# Patient Record
Sex: Female | Born: 1975 | Race: White | Hispanic: No | Marital: Married
Health system: Southern US, Community
[De-identification: ages and names within clinical notes are randomized; demographics above are authoritative.]

## PROBLEM LIST (undated history)

## (undated) DIAGNOSIS — E039 Hypothyroidism, unspecified: Secondary | ICD-10-CM

## (undated) DIAGNOSIS — Z1231 Encounter for screening mammogram for malignant neoplasm of breast: Secondary | ICD-10-CM

## (undated) DIAGNOSIS — N920 Excessive and frequent menstruation with regular cycle: Secondary | ICD-10-CM

## (undated) DIAGNOSIS — N840 Polyp of corpus uteri: Secondary | ICD-10-CM

## (undated) DIAGNOSIS — E282 Polycystic ovarian syndrome: Secondary | ICD-10-CM

## (undated) DIAGNOSIS — I10 Essential (primary) hypertension: Secondary | ICD-10-CM

## (undated) DIAGNOSIS — E119 Type 2 diabetes mellitus without complications: Secondary | ICD-10-CM

## (undated) DIAGNOSIS — E240 Pituitary-dependent Cushing's disease: Secondary | ICD-10-CM

## (undated) DIAGNOSIS — F32A Depression, unspecified: Secondary | ICD-10-CM

## (undated) DIAGNOSIS — F329 Major depressive disorder, single episode, unspecified: Secondary | ICD-10-CM

## (undated) DIAGNOSIS — G4730 Sleep apnea, unspecified: Secondary | ICD-10-CM

## (undated) DIAGNOSIS — E559 Vitamin D deficiency, unspecified: Secondary | ICD-10-CM

## (undated) DIAGNOSIS — T8859XA Other complications of anesthesia, initial encounter: Secondary | ICD-10-CM

## (undated) DIAGNOSIS — E1140 Type 2 diabetes mellitus with diabetic neuropathy, unspecified: Secondary | ICD-10-CM

## (undated) DIAGNOSIS — F419 Anxiety disorder, unspecified: Secondary | ICD-10-CM

## (undated) DIAGNOSIS — K3184 Gastroparesis: Secondary | ICD-10-CM

## (undated) DIAGNOSIS — E785 Hyperlipidemia, unspecified: Secondary | ICD-10-CM

## (undated) DIAGNOSIS — M5030 Other cervical disc degeneration, unspecified cervical region: Secondary | ICD-10-CM

## (undated) DIAGNOSIS — E1165 Type 2 diabetes mellitus with hyperglycemia: Secondary | ICD-10-CM

## (undated) DIAGNOSIS — Z794 Long term (current) use of insulin: Secondary | ICD-10-CM

## (undated) DIAGNOSIS — T50902A Poisoning by unspecified drugs, medicaments and biological substances, intentional self-harm, initial encounter: Secondary | ICD-10-CM

## (undated) DIAGNOSIS — T4145XA Adverse effect of unspecified anesthetic, initial encounter: Secondary | ICD-10-CM

## (undated) DIAGNOSIS — M979XXA Periprosthetic fracture around unspecified internal prosthetic joint, initial encounter: Secondary | ICD-10-CM

## (undated) DIAGNOSIS — M810 Age-related osteoporosis without current pathological fracture: Secondary | ICD-10-CM

## (undated) HISTORY — DX: Type 2 diabetes mellitus with hyperglycemia: E11.65

## (undated) HISTORY — DX: Long term (current) use of insulin: Z79.4

## (undated) HISTORY — DX: Poisoning by unspecified drugs, medicaments and biological substances, intentional self-harm, initial encounter: T50.902A

## (undated) HISTORY — DX: Gastroparesis: K31.84

## (undated) HISTORY — DX: Type 2 diabetes mellitus with diabetic neuropathy, unspecified: E11.40

## (undated) HISTORY — PX: TUBAL LIGATION: SHX77

## (undated) HISTORY — DX: Other cervical disc degeneration, unspecified cervical region: M50.30

---
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
## 1998-04-11 ENCOUNTER — Emergency Department (HOSPITAL_COMMUNITY): Admission: EM | Admit: 1998-04-11 | Discharge: 1998-04-11 | Payer: Self-pay | Admitting: Internal Medicine

## 2000-12-09 ENCOUNTER — Other Ambulatory Visit: Admission: RE | Admit: 2000-12-09 | Discharge: 2000-12-09 | Payer: Self-pay | Admitting: Obstetrics and Gynecology

## 2001-01-14 ENCOUNTER — Encounter: Admission: RE | Admit: 2001-01-14 | Discharge: 2001-04-14 | Payer: Self-pay | Admitting: Obstetrics and Gynecology

## 2001-02-06 ENCOUNTER — Ambulatory Visit (HOSPITAL_COMMUNITY): Admission: RE | Admit: 2001-02-06 | Discharge: 2001-02-06 | Payer: Self-pay | Admitting: Obstetrics and Gynecology

## 2001-02-06 ENCOUNTER — Encounter: Payer: Self-pay | Admitting: Obstetrics and Gynecology

## 2001-02-28 ENCOUNTER — Ambulatory Visit (HOSPITAL_COMMUNITY): Admission: RE | Admit: 2001-02-28 | Discharge: 2001-02-28 | Payer: Self-pay | Admitting: Obstetrics and Gynecology

## 2001-02-28 ENCOUNTER — Encounter: Payer: Self-pay | Admitting: Obstetrics and Gynecology

## 2001-03-16 ENCOUNTER — Inpatient Hospital Stay (HOSPITAL_COMMUNITY): Admission: AD | Admit: 2001-03-16 | Discharge: 2001-03-16 | Payer: Self-pay | Admitting: Obstetrics and Gynecology

## 2001-04-04 ENCOUNTER — Encounter: Payer: Self-pay | Admitting: Obstetrics and Gynecology

## 2001-04-04 ENCOUNTER — Ambulatory Visit (HOSPITAL_COMMUNITY): Admission: RE | Admit: 2001-04-04 | Discharge: 2001-04-04 | Payer: Self-pay | Admitting: Obstetrics and Gynecology

## 2001-05-05 ENCOUNTER — Encounter (HOSPITAL_COMMUNITY): Admission: RE | Admit: 2001-05-05 | Discharge: 2001-06-02 | Payer: Self-pay | Admitting: Obstetrics and Gynecology

## 2001-06-02 ENCOUNTER — Inpatient Hospital Stay (HOSPITAL_COMMUNITY): Admission: AD | Admit: 2001-06-02 | Discharge: 2001-06-02 | Payer: Self-pay | Admitting: *Deleted

## 2001-06-03 ENCOUNTER — Encounter: Payer: Self-pay | Admitting: Obstetrics and Gynecology

## 2001-06-03 ENCOUNTER — Inpatient Hospital Stay (HOSPITAL_COMMUNITY): Admission: AD | Admit: 2001-06-03 | Discharge: 2001-06-06 | Payer: Self-pay | Admitting: Obstetrics and Gynecology

## 2001-08-01 ENCOUNTER — Other Ambulatory Visit: Admission: RE | Admit: 2001-08-01 | Discharge: 2001-08-01 | Payer: Self-pay | Admitting: Obstetrics and Gynecology

## 2004-06-30 ENCOUNTER — Encounter: Admission: RE | Admit: 2004-06-30 | Discharge: 2004-06-30 | Payer: Self-pay | Admitting: Nephrology

## 2004-07-11 ENCOUNTER — Encounter: Admission: RE | Admit: 2004-07-11 | Discharge: 2004-07-11 | Payer: Self-pay | Admitting: Obstetrics and Gynecology

## 2004-08-12 ENCOUNTER — Inpatient Hospital Stay (HOSPITAL_COMMUNITY): Admission: AD | Admit: 2004-08-12 | Discharge: 2004-08-12 | Payer: Self-pay | Admitting: *Deleted

## 2004-12-01 ENCOUNTER — Inpatient Hospital Stay (HOSPITAL_COMMUNITY): Admission: AD | Admit: 2004-12-01 | Discharge: 2004-12-01 | Payer: Self-pay | Admitting: Obstetrics and Gynecology

## 2004-12-17 ENCOUNTER — Inpatient Hospital Stay (HOSPITAL_COMMUNITY): Admission: AD | Admit: 2004-12-17 | Discharge: 2004-12-17 | Payer: Self-pay | Admitting: Obstetrics and Gynecology

## 2004-12-19 ENCOUNTER — Inpatient Hospital Stay (HOSPITAL_COMMUNITY): Admission: AD | Admit: 2004-12-19 | Discharge: 2004-12-19 | Payer: Self-pay | Admitting: Obstetrics and Gynecology

## 2004-12-20 ENCOUNTER — Inpatient Hospital Stay (HOSPITAL_COMMUNITY): Admission: AD | Admit: 2004-12-20 | Discharge: 2004-12-20 | Payer: Self-pay | Admitting: *Deleted

## 2004-12-22 ENCOUNTER — Inpatient Hospital Stay (HOSPITAL_COMMUNITY): Admission: AD | Admit: 2004-12-22 | Discharge: 2004-12-22 | Payer: Self-pay | Admitting: Obstetrics and Gynecology

## 2004-12-25 ENCOUNTER — Inpatient Hospital Stay (HOSPITAL_COMMUNITY): Admission: AD | Admit: 2004-12-25 | Discharge: 2004-12-25 | Payer: Self-pay | Admitting: Obstetrics and Gynecology

## 2004-12-29 ENCOUNTER — Ambulatory Visit: Payer: Self-pay | Admitting: *Deleted

## 2005-01-01 ENCOUNTER — Encounter (INDEPENDENT_AMBULATORY_CARE_PROVIDER_SITE_OTHER): Payer: Self-pay | Admitting: Specialist

## 2005-01-01 ENCOUNTER — Inpatient Hospital Stay (HOSPITAL_COMMUNITY): Admission: RE | Admit: 2005-01-01 | Discharge: 2005-01-05 | Payer: Self-pay | Admitting: Obstetrics and Gynecology

## 2005-01-24 ENCOUNTER — Ambulatory Visit (HOSPITAL_BASED_OUTPATIENT_CLINIC_OR_DEPARTMENT_OTHER): Admission: RE | Admit: 2005-01-24 | Discharge: 2005-01-24 | Payer: Self-pay | Admitting: Pulmonary Disease

## 2005-02-07 ENCOUNTER — Ambulatory Visit: Payer: Self-pay | Admitting: Pulmonary Disease

## 2005-02-27 ENCOUNTER — Ambulatory Visit: Payer: Self-pay | Admitting: Pulmonary Disease

## 2005-04-03 ENCOUNTER — Ambulatory Visit: Payer: Self-pay | Admitting: Pulmonary Disease

## 2005-05-09 ENCOUNTER — Ambulatory Visit: Payer: Self-pay | Admitting: Pulmonary Disease

## 2005-05-21 ENCOUNTER — Ambulatory Visit: Payer: Self-pay | Admitting: *Deleted

## 2005-05-28 ENCOUNTER — Ambulatory Visit: Payer: Self-pay | Admitting: *Deleted

## 2005-06-04 ENCOUNTER — Ambulatory Visit: Payer: Self-pay | Admitting: *Deleted

## 2005-06-11 ENCOUNTER — Ambulatory Visit: Payer: Self-pay | Admitting: *Deleted

## 2005-06-25 ENCOUNTER — Ambulatory Visit: Payer: Self-pay | Admitting: *Deleted

## 2005-07-23 ENCOUNTER — Ambulatory Visit: Payer: Self-pay | Admitting: *Deleted

## 2005-09-25 ENCOUNTER — Encounter (INDEPENDENT_AMBULATORY_CARE_PROVIDER_SITE_OTHER): Payer: Self-pay | Admitting: *Deleted

## 2005-09-25 ENCOUNTER — Inpatient Hospital Stay (HOSPITAL_COMMUNITY): Admission: EM | Admit: 2005-09-25 | Discharge: 2005-09-28 | Payer: Self-pay | Admitting: Emergency Medicine

## 2005-09-25 HISTORY — PX: LAPAROSCOPIC CHOLECYSTECTOMY: SUR755

## 2005-09-27 ENCOUNTER — Encounter: Payer: Self-pay | Admitting: Gastroenterology

## 2005-09-28 ENCOUNTER — Ambulatory Visit: Payer: Self-pay | Admitting: Gastroenterology

## 2005-10-05 ENCOUNTER — Encounter: Admission: RE | Admit: 2005-10-05 | Discharge: 2005-10-05 | Payer: Self-pay | Admitting: Gastroenterology

## 2005-10-16 ENCOUNTER — Ambulatory Visit: Payer: Self-pay | Admitting: Gastroenterology

## 2006-02-11 ENCOUNTER — Encounter: Admission: RE | Admit: 2006-02-11 | Discharge: 2006-02-11 | Payer: Self-pay | Admitting: Obstetrics and Gynecology

## 2007-09-17 ENCOUNTER — Emergency Department (HOSPITAL_COMMUNITY): Admission: EM | Admit: 2007-09-17 | Discharge: 2007-09-17 | Payer: Self-pay | Admitting: Emergency Medicine

## 2007-11-14 ENCOUNTER — Encounter: Admission: RE | Admit: 2007-11-14 | Discharge: 2008-02-12 | Payer: Self-pay | Admitting: Surgery

## 2007-12-16 ENCOUNTER — Emergency Department (HOSPITAL_COMMUNITY): Admission: EM | Admit: 2007-12-16 | Discharge: 2007-12-16 | Payer: Self-pay | Admitting: Emergency Medicine

## 2008-02-16 ENCOUNTER — Encounter: Admission: RE | Admit: 2008-02-16 | Discharge: 2008-05-16 | Payer: Self-pay | Admitting: Surgery

## 2008-06-08 ENCOUNTER — Encounter: Admission: RE | Admit: 2008-06-08 | Discharge: 2008-07-20 | Payer: Self-pay | Admitting: Surgery

## 2009-03-01 ENCOUNTER — Emergency Department (HOSPITAL_COMMUNITY): Admission: EM | Admit: 2009-03-01 | Discharge: 2009-03-01 | Payer: Self-pay | Admitting: Family Medicine

## 2009-03-19 ENCOUNTER — Emergency Department (HOSPITAL_COMMUNITY): Admission: EM | Admit: 2009-03-19 | Discharge: 2009-03-19 | Payer: Self-pay | Admitting: Emergency Medicine

## 2010-06-27 ENCOUNTER — Ambulatory Visit (HOSPITAL_COMMUNITY): Payer: Self-pay | Admitting: Psychology

## 2010-07-04 ENCOUNTER — Ambulatory Visit (HOSPITAL_COMMUNITY): Payer: Self-pay | Admitting: Psychology

## 2010-07-18 ENCOUNTER — Ambulatory Visit (HOSPITAL_COMMUNITY): Payer: Self-pay | Admitting: Psychology

## 2010-07-25 ENCOUNTER — Emergency Department (HOSPITAL_COMMUNITY): Admission: EM | Admit: 2010-07-25 | Discharge: 2010-07-25 | Payer: Self-pay | Admitting: Emergency Medicine

## 2010-08-01 ENCOUNTER — Ambulatory Visit (HOSPITAL_COMMUNITY): Payer: Self-pay | Admitting: Psychology

## 2010-08-15 ENCOUNTER — Ambulatory Visit (HOSPITAL_COMMUNITY): Payer: Self-pay | Admitting: Psychiatry

## 2010-08-25 ENCOUNTER — Ambulatory Visit (HOSPITAL_COMMUNITY): Payer: Self-pay | Admitting: Psychology

## 2010-09-12 ENCOUNTER — Ambulatory Visit (HOSPITAL_COMMUNITY)
Admission: RE | Admit: 2010-09-12 | Discharge: 2010-09-12 | Payer: Self-pay | Source: Home / Self Care | Attending: Psychology | Admitting: Psychology

## 2010-09-26 ENCOUNTER — Ambulatory Visit (HOSPITAL_COMMUNITY)
Admission: RE | Admit: 2010-09-26 | Discharge: 2010-09-26 | Payer: Self-pay | Source: Home / Self Care | Attending: Psychology | Admitting: Psychology

## 2010-10-03 ENCOUNTER — Ambulatory Visit (HOSPITAL_COMMUNITY)
Admission: RE | Admit: 2010-10-03 | Discharge: 2010-10-03 | Payer: Self-pay | Source: Home / Self Care | Attending: Psychiatry | Admitting: Psychiatry

## 2010-10-09 ENCOUNTER — Ambulatory Visit (HOSPITAL_COMMUNITY)
Admission: RE | Admit: 2010-10-09 | Discharge: 2010-10-09 | Payer: Self-pay | Source: Home / Self Care | Attending: Psychology | Admitting: Psychology

## 2010-10-23 ENCOUNTER — Encounter (HOSPITAL_COMMUNITY): Payer: 59 | Admitting: Psychology

## 2010-10-23 DIAGNOSIS — F331 Major depressive disorder, recurrent, moderate: Secondary | ICD-10-CM

## 2010-11-06 ENCOUNTER — Encounter (HOSPITAL_COMMUNITY): Payer: 59 | Admitting: Psychology

## 2010-11-13 ENCOUNTER — Encounter (HOSPITAL_COMMUNITY): Payer: 59 | Admitting: Psychology

## 2010-11-13 DIAGNOSIS — F331 Major depressive disorder, recurrent, moderate: Secondary | ICD-10-CM

## 2010-11-22 ENCOUNTER — Other Ambulatory Visit: Payer: Self-pay

## 2010-11-22 ENCOUNTER — Other Ambulatory Visit: Payer: Self-pay | Admitting: Podiatrist

## 2010-11-22 DIAGNOSIS — M25572 Pain in left ankle and joints of left foot: Secondary | ICD-10-CM

## 2010-11-25 ENCOUNTER — Ambulatory Visit
Admission: RE | Admit: 2010-11-25 | Discharge: 2010-11-25 | Disposition: A | Discharge status: Home / Self Care | Payer: 59 | Source: Ambulatory Visit | Attending: Podiatrist | Admitting: Podiatrist

## 2010-11-25 DIAGNOSIS — M25572 Pain in left ankle and joints of left foot: Secondary | ICD-10-CM

## 2010-11-28 ENCOUNTER — Encounter (HOSPITAL_COMMUNITY): Payer: 59 | Admitting: Physician Assistant

## 2010-11-28 DIAGNOSIS — F332 Major depressive disorder, recurrent severe without psychotic features: Secondary | ICD-10-CM

## 2010-11-29 ENCOUNTER — Encounter (HOSPITAL_COMMUNITY): Payer: 59 | Admitting: Psychology

## 2010-11-29 DIAGNOSIS — F330 Major depressive disorder, recurrent, mild: Secondary | ICD-10-CM

## 2010-12-04 ENCOUNTER — Encounter (HOSPITAL_BASED_OUTPATIENT_CLINIC_OR_DEPARTMENT_OTHER)
Admission: RE | Admit: 2010-12-04 | Discharge: 2010-12-04 | Disposition: A | Discharge status: Home / Self Care | Payer: Self-pay | Source: Ambulatory Visit | Attending: Podiatrist | Admitting: Podiatrist

## 2010-12-04 DIAGNOSIS — Z01810 Encounter for preprocedural cardiovascular examination: Secondary | ICD-10-CM | POA: Insufficient documentation

## 2010-12-04 DIAGNOSIS — Z01812 Encounter for preprocedural laboratory examination: Secondary | ICD-10-CM | POA: Insufficient documentation

## 2010-12-04 LAB — BASIC METABOLIC PANEL
Creatinine, Ser: 0.47 mg/dL (ref 0.4–1.2)
GFR calc non Af Amer: >60 mL/min (ref >60)

## 2010-12-08 ENCOUNTER — Ambulatory Visit (HOSPITAL_BASED_OUTPATIENT_CLINIC_OR_DEPARTMENT_OTHER): Admission: RE | Admit: 2010-12-08 | Payer: Self-pay | Source: Ambulatory Visit | Admitting: Podiatrist

## 2010-12-11 ENCOUNTER — Encounter (HOSPITAL_BASED_OUTPATIENT_CLINIC_OR_DEPARTMENT_OTHER): Payer: Self-pay | Admitting: Psychology

## 2010-12-11 DIAGNOSIS — F330 Major depressive disorder, recurrent, mild: Secondary | ICD-10-CM

## 2010-12-17 LAB — DIFFERENTIAL
Basophils Absolute: 0 10*3/uL (ref 0.0–0.1)
Lymphocytes Relative: 22 % (ref 12–46)
Monocytes Absolute: 0.4 10*3/uL (ref 0.1–1.0)
Monocytes Relative: 5 % (ref 3–12)
Neutro Abs: 5.3 10*3/uL (ref 1.7–7.7)
Neutrophils Relative %: 71 % (ref 43–77)

## 2010-12-17 LAB — BASIC METABOLIC PANEL
BUN: 10 mg/dL (ref 6–23)
CO2: 26 mEq/L (ref 19–32)
Chloride: 97 mEq/L (ref 96–112)
Creatinine, Ser: 0.54 mg/dL (ref 0.4–1.2)
Glucose, Bld: 374 mg/dL — ABNORMAL HIGH (ref 70–99)
Potassium: 4.2 mEq/L (ref 3.5–5.1)
Sodium: 135 mEq/L (ref 135–145)

## 2010-12-17 LAB — CBC
Hemoglobin: 14.3 g/dL (ref 12.0–15.0)
RBC: 4.79 MIL/uL (ref 3.87–5.11)

## 2010-12-25 ENCOUNTER — Encounter (HOSPITAL_BASED_OUTPATIENT_CLINIC_OR_DEPARTMENT_OTHER): Payer: Self-pay | Admitting: Psychology

## 2010-12-25 DIAGNOSIS — F330 Major depressive disorder, recurrent, mild: Secondary | ICD-10-CM

## 2011-01-09 ENCOUNTER — Encounter (HOSPITAL_COMMUNITY): Payer: Self-pay | Admitting: Psychology

## 2011-01-11 ENCOUNTER — Encounter (HOSPITAL_BASED_OUTPATIENT_CLINIC_OR_DEPARTMENT_OTHER): Payer: Self-pay | Admitting: Psychology

## 2011-01-11 DIAGNOSIS — F330 Major depressive disorder, recurrent, mild: Secondary | ICD-10-CM

## 2011-01-26 NOTE — Op Note (Signed)
NAMESheyann, [name]                ACCOUNT NO.:  [id number]   MEDICAL RECORD NO.:  [id number]          PATIENT TYPE:  INP   LOCATION:  2550                         FACILITY:  [facility]   PHYSICIAN:  Currie Paris, M.D.DATE OF BIRTH:  1975/10/17   DATE OF PROCEDURE:  09/25/2005  DATE OF DISCHARGE:                                 OPERATIVE REPORT   PREOP DIAGNOSIS:  Developing acute calculus cholecystitis.   POSTOPERATIVE DIAGNOSIS:  Developing acute calculus cholecystitis.   OPERATION:  Laparoscopic cholecystectomy with operative cholangiogram.   SURGEON:  Currie Paris, M.D.   ASSISTANT:  Gabrielle Dare. Janee Morn, M.D.   ANESTHESIA:  General endotracheal.   CLINICAL HISTORY:  This is a 35 year old with biliary-type symptoms and  tenderness in the right upper quadrant and inability to control her pain  with pain medication. She had known gallstones and it was felt that this  represented a severe episode of biliary colic. She agreed to proceed to  cholecystectomy.   DESCRIPTION OF PROCEDURE:  The patient was in the holding area and we  reviewed, again, the plans for surgery. She, again, had no further  questions. She was taken to the operating room after satisfactory general  endotracheal anesthesia had been obtained, the abdomen was prepped and  draped. A time-out occurred.   I used some 0.25% plain Marcaine. The patient is morbidly obese, so I made  her initial incision at the upper end of a prior midline scar which extended  above the umbilicus. The fascia was identified,. but fairly deep, at the  level of the depth of an appendiceal retractor. It was opened and I was able  to enter the peritoneal cavity and placed a pursestring and introduced the  Hasson.  At the time of insufflating the abdomen and placing the camera, I  saw that we were tied up with a bunch of adhesions, that looked like  omentum; but inferiorly I was free, and I could swing the camera around  superiorly to get a good visualization of the upper abdomen. Then under  direct vision, I put a 10-11 trocar in the epigastrium. When we removed the  camera for cleaning purposes I could not get directly back in; and when I  put the camera in at the epigastric port we could see just a line of midline  adhesions of omentum. At this point, I put two 5-mm trocars under direct  vision in the mid-right abdomen in the usual place for cholecystectomy. Then  using blunt dissection, I was able to free up the omental adhesions the  entire length of her old scar. This freed up the umbilical port so that we  could easily use it.   The port was placed, and the patient in reverse Trendelenburg. The  gallbladder could not easily be retracted over the liver; it was fairly  small but edematous. I was able to open up the peritoneum and identify the  cystic duct, I could not really identify the cystic artery. However, once I  had the duct identified, and could see that I had a fairly long length  of  it, and I could see it the gallbladder; I clipped it. I opened it and  introduced a Cook catheter and did an operative cholangiogram which was  normal looking. The catheter was removed and two clips placed on the stay  side of the cystic duct and it was divided. This gave me a little more  mobility, and I could then see the cystic artery; divided that after  clipping it; and then removed the gallbladder from below-to-above. There is  no bleeding along the bed of the gallbladder.   The gallbladder was placed in a bag and brought out the umbilical port. We  reinsufflated and checked the omentum to make sure that there had been no  bleeding from where I had taken that down, and everything appeared to be  dry. We irrigated, again, to make sure around the gallbladder that was no  bleeding; and everything, again, here appeared to be dry. The umbilical  supraumbilical port was then closed with the pursestring plus a  figure-of-  eight suture of Vicryl. The lateral port was removed and there was bleeding.  The abdomen was deflated through the epigastric port. Skin was closed with 4-  0 Monocryl, subcuticular, and Steri-Strips. The patient tolerated the  procedure well. There were no operative complications. All counts were  correct.      Currie Paris, M.D.  Electronically Signed     CJS/MEDQ  D:  09/25/2005  T:  09/25/2005  Job:  [id number]

## 2011-01-26 NOTE — H&P (Signed)
NAMEZaraya, Delauder Maritta                ACCOUNT NO.:  [id number]   MEDICAL RECORD NO.:  [id number]          PATIENT TYPE:  MAT   LOCATION:  MATC                          FACILITY:  [facility]   PHYSICIAN:  [place] B. Earlene Plater, M.D.  DATE OF BIRTH:  12-21-1975   DATE OF ADMISSION:  08/12/2004  DATE OF DISCHARGE:                                HISTORY & PHYSICAL   CHIEF COMPLAINT:  Left-sided headache and ear pain.   HISTORY OF PRESENT ILLNESS:  A 35 year old white female, gravida 4, para 3,  at 18+ weeks, who presents with a one-week history of upper respiratory  symptoms, primarily cough and nasal congestion.  However, starting yesterday  she developed left ear pain and associated left temporal headache.  She has  had no focal symptoms such as weakness or numbness.  She has had one episode  of dizziness that resolved after a few minutes.   PAST MEDICAL HISTORY:  Morbid obesity and gestational diabetes.  Third  pregnancy complicated by preeclampsia.   FAMILY HISTORY:  Noncontributory.   REVIEW OF SYSTEMS:  Otherwise negative.   PAST SURGICAL HISTORY:  Cesarean section x2.   PHYSICAL EXAMINATION:  VITAL SIGNS:  Temperature 97.9, blood pressure  125/41, pulse 125, respiratory rate 26.  GENERAL:  The patient is alert and oriented and in no acute distress.  Has a  fair amount of nasal congestion and upper airway congestion.  HEENT:  Left TM is erythematous and slightly bulging with slight dullness to  the TM.  The right TM is normal in appearance other than a small amount of  fluid.  The posterior pharynx is slightly erythematous.  Rapid Strep is  performed.  LUNGS:  Clear.  HEART:  Regular rate and rhythm.  No sinus tenderness noted.  NEUROLOGY:  The patient is alert and oriented.  Gait is normal.  Normal  motor function upper and lower extremities and cranial nerves are intact.   LABORATORY DATA:  Rapid Strep is negative.   ASSESSMENT:  Left otitis media and associated left temporal headache.   In  that these symptoms occurred on a similar timeline, I informed the patient I  believe they are related.  However, should her headache worsen, I would  recommend reevaluation at the emergency room and consideration for a CT scan  given.  For now, we will prescribe  Ceftin 250 mg p.o. b.i.d. x10 days for acute left otitis media.  In  addition, a to-go pack of Darvocet #4 is given for her headache.  The  patient is instructed that should her symptoms worsen or change, I would  recommend reevaluation.     Wesl   WBD/MEDQ  D:  08/12/2004  T:  08/12/2004  Job:  [id number]

## 2011-01-26 NOTE — Discharge Summary (Signed)
[facility]  PatientKIERSTAN, [name] Visit Number: [id number] MRN: [id number]          Service Type: OBS Location: 910A [id number] [id number] Attending Physician:  Maxie Better Dictated by:   Sheria Lang. Cherly Hensen, M.D. Admit Date:  06/03/2001 Discharge Date: 06/06/2001                             Discharge Summary  ADMISSION DIAGNOSES:          1. Preeclampsia.                               2. Class A1 gestational diabetes.                               3. Intrauterine gestation at 37 weeks.                               4. Previous cesarean section.  DISCHARGE DIAGNOSES:          1. Preeclampsia.                               2. Intrauterine gestation at 37 weeks,                                  delivered.                               3. Class A1 gestational diabetes.                               4. Previous cesarean section.  PROCEDURE:                    1. Repeat cesarean section.                               2. Amniocentesis.  HISTORY OF PRESENT ILLNESS:   This is a 35 year old, gravida 3, para 1-0-1-1, female at [redacted] weeks gestation by a first trimester ultrasound with class A1 gestational diabetes admitted secondary to preeclampsia. The patient was found to have a blood pressure of 140/90. A 24-hour urine creatinine clearance and urine total protein revealed a urine total protein of 1290 mg per 24 hours. Her creatinine clearance was 226 ml per minute. Her uric acid was 7.2.  SGOT was 21. The patient denied any headache, visual changes, or epigastric pain. She denied any right upper quadrant pain but had noted some leg swelling. The prenatal course has been complicated by gestational diabetes, diet controlled, a transient fetal arrhythmia for which echocardiogram was done at [facility] which was normal, and her last ultrasound on August 26 showed an estimated fetal weight of 2541 g, which was at the 93rd percentile at  that time. Her blood type is O positive. She is rubella immune. Group B strep culture was positive. She had a first trimester blood pressure of 112/76. This is the same partner  as her first pregnancy for which she did not have much prenatal care and as far as she knew, did not have toxemia associated with that pregnancy.  HOSPITAL COURSE:              The patient was admitted to [facility]. She was placed on continuous fetal monitoring. She had a reactive nonstress test and some irregular contractions. Her physical exam was notable for her exogenous obesity. Her cervix was closed, long, and presenting part not in the pelvis. She had 1+ pitting edema and deep tendon reflexes were 2+. On presentation, her blood pressure at the hospital was 122/53. The patient was not put on magnesium sulfate due to her normotensive blood pressures. Repeat PIH labs showed a uric acid of 7.3, platelet count was 259,000, and hematocrit was 33.5. Given her gestational diabetes, the decision was made to document fetal lung maturity before proceeding with delivery. The patient underwent an uncomplicated amniocentesis under ultrasound guidance. The fluid was sent for fetal lung maturity. AmnioStat was confirmed to be positive. The patient opted for repeat cesarean section. She was therefore taken to the operating room where she underwent a repeat cesarean section with resultant delivery of a live female, Apgars of 8 and 9, weight of 8 pounds 2 ounces. Normal tubes and ovaries were noted at the time. Jackson-Pratt was placed _______ the patients large pannus.  Her postoperative course was unremarkable. She did not necessitate any magnesium sulfate postpartum. She remained afebrile throughout her hospital course. Jackson-Pratt was subsequently discontinued. By postoperative day #3, the patient who had remained afebrile, incision was without any erythema, induration, or exudate, was deemed well to be discharged  home. She was passing flatus. The plan is to have her staples removed in the office on day #7.  DISPOSITION:                  Home.  CONDITION:                    Stable.  DISCHARGE MEDICATIONS:        1. Tylox, #30, one to two tablets every three to                                  four hours p.r.n. pain.                               2. Motrin 800 mg p.o. q.6h. p.r.n. pain.                               3. Prenatal vitamins one p.o. q.d.  DISCHARGE FOLLOWUP:           The patient is to follow up for staple removal in seven days postoperatively. A two hour glucose tolerance test is to be done at eight weeks postpartum. The patient is to have her urine rechecked for protein at eight weeks postpartum. Her regular postpartum examination will be at four weeks postdelivery.  DISCHARGE INSTRUCTIONS:       The patient is to call for temperature greater than or equal to 100.4, nothing per vagina for four to six weeks, no heavy lifting or driving for two weeks, call if there is heavy vaginal bleeding, severe abdominal pain, nausea or vomiting, increased incisional pain, or any redness  or drainage from the incision site. PIH warning signs were also reaffirmed. Dictated by:   Sheria Lang. Cherly Hensen, M.D. Attending Physician:  Maxie Better DD:  07/02/01 TD:  07/03/01 Job: [id number]

## 2011-01-26 NOTE — Procedures (Signed)
[name], [name]                ACCOUNT NO.:  [id number]   MEDICAL RECORD NO.:  [id number]          PATIENT TYPE:  OUT   LOCATION:  SLEEP CENTER                 FACILITY:  [facility]   PHYSICIAN:  Marcelyn Bruins, M.D. [facility] DATE OF BIRTH:  11-16-1975   DATE OF STUDY:  01/24/2005                              NOCTURNAL POLYSOMNOGRAM   REFERRING PHYSICIAN:  Dr. Billy Fischer.   INDICATION FOR THE STUDY:  Hypersomnia with sleep apnea. Epworth score: 8.   SLEEP ARCHITECTURE:  The patient had total sleep time of 251 minutes with  significantly decreased REM and slow wave sleep. Sleep onset latency was  slightly prolonged and REM latency was extremely prolonged. Sleep efficiency  was only 61%.   IMPRESSION:  1.  Split night study reveals very severe obstructive sleep apnea/hypopnea      syndrome with 306 obstructive events noted in the first 118 minutes of      sleep. This gave the patient a respiratory disturbance index of 156      events per hour extrapolated over the entire study. There was O2      desaturation as low as 72%. Events were not positional but were      associated with very loud snoring. By protocol, the patient was then      placed on her usual C-PAP mask from home and a deluxe chin strap was      added. C-PAP titration was then initiated and the patient ended up on a      pressure of 17 cm. At this pressure the patient began to have central      apneas and therefore I think that is an over-titration. Looking back      over the pressure titration curve and associate events, it appears that      15 cm would be this patient's optimal pressure.  2.  No clinically significant cardiac arrhythmia.      KC/MEDQ  D:  02/08/2005 11:57:08  T:  02/08/2005 14:09:21  Job:  [id number]

## 2011-01-26 NOTE — Discharge Summary (Signed)
NAMEAliveah, [name]                ACCOUNT NO.:  [id number]   MEDICAL RECORD NO.:  [id number]          PATIENT TYPE:  INP   LOCATION:  3704                         FACILITY:  [facility]   PHYSICIAN:  [name], M.D.DATE OF BIRTH:  04/17/1976   DATE OF ADMISSION:  09/24/2005  DATE OF DISCHARGE:  09/28/2005                                 DISCHARGE SUMMARY   CONSULTATIONS:  [name], M.D., gastroenterology   REASON FOR ADMISSION:  Ms. [name] is a 35 year old female patient,  morbidly obese, 8 1/2 months postpartum, presented with right upper quadrant  abdominal pain.  While she was pregnant, she was noted to have gallstones.  Since that time, she has had intermittent bouts of biliary colic.  The night  prior to admission, she had a severe episode of right upper quadrant pain  radiating across her abdomen.  This was associated with nausea and vomiting  that was unrelenting.  Because of this, she presented to the ER.  Ultrasound  was performed and showed cholelithiasis without gallbladder wall thickening,  mild elevation in LFTs, without obstructive pattern, i.e. no elevation in  total bilirubin or alkaline phos.  She was given IV pain medications but she  continued to have pain.  Because of this, general surgery was called in.   PHYSICAL EXAMINATION:  On exam, the patient's blood pressure was stable, she  was afebrile, she was saturating 98% on room air.  Exam was unremarkable  except for the following findings.  The patient did have right upper  quadrant pain to palpation and had an intermittent Murphy's sign.  No signs  of obstruction.  No guarding, no rebound.   LABORATORY DATA:  White blood cell count 8,800 with a left shift, hemoglobin  14.1.  Glucose 152.  SGOT 96, SGPT 59.  Urine pregnancy negative.   Because of these findings, Dr. Abbey [name] admitted the patient with the  following diagnoses:   ADMISSION DIAGNOSIS:  1.  Persistent biliary colic with  documented cholelithiasis.  2.  Probable early evolving acute cholecystitis given the fact the patient      is having continued pain.  3.  Morbid obesity, BMI 59.  4.  Sleep apnea.   [name] COURSE:  The patient was admitted to the [name], because she is  on CPAP, she was admitted to a telemetry bed.  She was started on IV  antibiotics and possibility of undergoing laparoscopic cholecystectomy was  discussed with the patient.  On September 25, 2005, the patient was taken to  the operating room per Dr. Jamey [name] where she underwent laparoscopic  cholecystectomy with interoperative cholangiogram.  Postoperative diagnosis  was acute early cholecystitis.  During the interoperative cholangiogram,  there was some concern whether she may have some retained stones versus  bubbles.  Because of this, a GI consult was obtained.  Dr. Christella [name] did see  the patient and plans were to proceed with ERCP on September 27, 2005.   On September 27, 2005, the patient's LFTs were back at normal, AST 33, ALT 72.  She was continuing to have no pain.  She  was wanting to go home but an ERCP  was scheduled.  Unfortunately, due to the size of the patient's ducts, they  were too small to cannulate.  ERCP was unable to be completed, so an MRCP  was ordered.  Unfortunately, they were unable to perform the MRCP as well  due to IV infiltration and difficulty with current IV site working  appropriately, i.e., the patient complaining of burning pain so the MRCP was  cancelled.  On September 28, 2005, GI saw the patient and recommended that the  patient go home and follow up with GI.  Plans are to proceed with open MRI  and follow up.  They noted that if stones were confirmed on the MRI, that  they would attempt an ERCP again.   On the day of discharge, the patient was without pain.  She was eager to  transfer home.  Her husband and children have been in the room.  She was  afebrile, blood pressure was stable at 120/66, heart rate  83, she was  saturating 99% on room air, she was tolerating her diet, and her incisions  were clean, dry, and intact.   DISCHARGE DIAGNOSIS:  1.  Abdominal pain/biliary colic secondary to cholelithiasis and early acute      cholecystitis.  2.  Status post laparoscopic cholecystectomy.  3.  Interoperative cholangiogram, concern for retained stones versus      bubbles.  4.  Morbid obesity.  5.  Sleep apnea.   DISCHARGE MEDICATIONS:  Tylenol extra strength 500 mg 1-2 every 4-6 hours  p.r.n. pain.   DISCHARGE INSTRUCTIONS:  Diet no restrictions at this point.  Activity:  No  shower, no lifting greater than 10 pounds for one week.  She is to see Dr.  Jamey [name] in the office on February 6 at 9:30 a.m.  She is to see Dr. Christella [name] in  the office on February 6 at 2:30 p.m.  She is to have her MRCP done at  [name] on Friday, January 26.  She has been instructed to have  nothing to eat or drink after 11 a.m. on that day.  She is to arrive at 2  p.m. for the test.  [name], P.A.-C. with GI also spent plenty of time  discussing with the patient potential serious outcomes related to the  patient's continued morbid obesity.  Overeaters Anonymous was suggested and  the patient expressed interest in attending so she has been given the number  to [name].      [name], N.P.      [name], M.D.  Electronically Signed    ALE/MEDQ  D:  09/28/2005  T:  09/28/2005  Job:  [id number]   cc:   [name], M.D.

## 2011-01-26 NOTE — Op Note (Signed)
[facility]  PatientJAZLIN, [name] Visit Number: [id number] MRN: [id number]          Service Type: OBS Location: [facility] Attending Physician:  Ermalene Searing Dictated by:   Sheria Lang Cherly Hensen, M.D. Proc. Date: 06/03/01 Admit Date:  06/02/2001 Discharge Date: 06/02/2001                             Operative Report  PREOPERATIVE DIAGNOSES:       1. Preeclampsia.                               2. Previous cesarean section.                               3. Class A1 gestational diabetes.                               4. Intrauterine gestation at 37 weeks with                                  mature lung indices.  PROCEDURE:                    Repeat cesarean section, Kerr hysterotomy.  POSTOPERATIVE DIAGNOSES:      1. Previous cesarean section.                               2. Preeclampsia.                               3. Class A1 gestational diabetes.                               4. Intrauterine gestation at 37 weeks.                               5. Fetal macrosomia.  SURGEON:                      Sheronette A. Cherly Hensen, M.D.  ASSISTANT:                    Sung Amabile. Roslyn Smiling, M.D.  ANESTHESIA:                   Spinal.  INDICATIONS:                  This is a 35 year old, gravida 3, para 1-0-0-1, female at [redacted] weeks gestation by a first trimester ultrasound with known class A1 gestational diabetes and a prior cesarean section, who was admitted secondary to uric acid of 7.2 and 24-hour urine total protein revealing 1290 mg. The rest of her PIH labs were unremarkable. The patient has had a previous cesarean section. She has had lower extremity swelling, denied any headache or visual changes. She has had heartburn throughout the pregnancy and has been using Mylanta. The patient underwent an amniocentesis that confirmed fetal lung maturity  and opted to undergo a repeat cesarean section. The patient has had elevated blood pressure in the office  on May 30, 2001 at which time it was 140/90. Her baseline blood pressure is about 112/70. The patient probably has early signs of preeclampsia; she has not had any prior history of protein in her urine and up until the visit of September 20, had not noted any significant proteinuria. The patient is morbidly obese. Her group B strep culture is positive. She desires to proceed with a repeat cesarean section. Risk and benefit of the procedure have been explained to the patient and her husband. Consent was signed. The patient was transferred to the operating room.  DESCRIPTION OF PROCEDURE:     Under adequate spinal anesthesia, the patient was placed in the supine position with a left lateral tilt. Her belly, due to the _______ was positioned so that the previous Pfannenstiel skin incision could be seen. The upper abdomen had been taped. The patient was sterilely prepped and draped in the usual fashion. The bladder was catheterized with a indwelling Foley catheter. Marcaine 0.25% was injected along the previous Pfannenstiel incision. Pfannenstiel skin incision was thus made through the previous scar, carried down to the rectus fascia using sharp dissection. The rectus fascia was incised in the midline and extended bilaterally. The rectus fascia was then bluntly and sharply dissected off the rectus muscle in superior and inferior fashion. The rectus muscle was split in the midline. The parietal peritoneum was entered bluntly and extended. The vesicouterine peritoneum was opened and the bladder was then bluntly dissected off the lower uterine segment and displaced from the operative field using a Doyen retractor. A curvilinear low transverse uterine incision was then made and extended bilaterally using bandage scissors. At that point, it was evident that the placenta was anterior and this necessitated traversing the placenta in order to deliver the fetus. This was done. Copious amount of  clear amniotic fluid was noted. A vacuum was attempted to use in the assist of the delivery and after several pop-offs, the resultant delivery of a live female infant from the right transverse position. The baby was bulb suctioned on the abdomen. The cord was clamped and cut. The baby was transferred to the awaiting pediatrician who subsequently assigned Apgars of 8 at 9 at one and five minutes. The placenta was manually removed. The uterine cavity was cleaned of debris. The uterine incision, which had no extension, was closed in two layers. The first layer was a O Monocryl running locked stitch. The second layer was imbricating using O Monocryl suture. The right angle of the incision had some bleeding which was figure-of-eight sutured. Small bleeding along the lower aspect of the uterus was hemostased using cautery. Normal tubes and ovaries were noted bilaterally. There was a small defect in the omentum which was opened and otherwise no other abnormal findings were seen. The abdomen was irrigated, suctioned of debris. The parietal peritoneum was not closed. The fascia on the surface as well as the muscle was inspected. Small bleeders were cauterized. The rectus fascia was closed with O Vicryl x 2. A Jackson-Pratt drainage was then subsequently placed after the subcutaneous was irrigated. Small bleeders were cauterized. It exited to the right and superior to the incisions. The subcutaneous fat, due to the depth, was closed with interrupted 3-0 plain suture. The skin was approximated using Ethicon staples.  SPECIMEN:  Placenta _______ sent to pathology.  ESTIMATED BLOOD LOSS:         800 cc.  INTRAOPERATIVE FLUID:         Two liters of crystalloid.  URINE OUTPUT:                 Urine output was reported as scant, though yellow that was noted.  SPONGE AND INSTRUMENT COUNT:  Correct x 2.  COMPLICATIONS:                None.  DRAINS:                       The  Jackson-Pratt was placed to bulb suction and the area was secured using nylon suture.  DISPOSITION:                  The patient tolerated the procedure well and was  transferred to the recovery room in stable condition. Weight of the baby was 8 pounds 2 ounces. Dictated by:   Sheria Lang. Cherly Hensen, M.D. Attending Physician:  Marina Gravel B DD:  06/03/01 TD:  06/03/01 Job: [id number]

## 2011-01-26 NOTE — H&P (Signed)
NAMEPaislee, Szatkowski Bettina                ACCOUNT NO.:  [id number]   MEDICAL RECORD NO.:  [id number]          PATIENT TYPE:  EMS   LOCATION:  MAJO                         FACILITY:  [facility]   PHYSICIAN:  Adolph Pollack, M.D.DATE OF BIRTH:  12/21/1975   DATE OF ADMISSION:  09/24/2005  DATE OF DISCHARGE:                                HISTORY & PHYSICAL   CHIEF COMPLAINT:  Right upper quadrant abdominal pain, nausea, and vomiting.   HISTORY OF PRESENT ILLNESS:  This is a 35 year old female who is about 8-1/2  months postpartum.  During her pregnancy, she had been having upper  abdominal pain and was discovered to have gallstones.  She had a cesarean  section about 8-1/2 months ago.  Ever since then, she has had intermittent  bouts of biliary colic, and then last night she had a severe bout of right  upper quadrant pain radiating across her abdomen, associated with nausea and  vomiting that was unrelenting, bringing her to the emergency room.  She was  evaluated here, and repeat ultrasound performed, demonstrating  cholelithiasis but no gallbladder wall thickening.  She had mild elevation  of her SGOT and SGPT.  She was given IV analgesics.  However, the pain  continued to recur, and at this point we were called to see her.  She denies  any fever or chills.  Denies jaundice.   PAST MEDICAL HISTORY:  1.  Morbid obesity.  2.  Sleep apnea.  3.  Gestational diabetes.  4.  Childhood seizures.   PREVIOUS OPERATIONS:  Cesarean sections.   ALLERGIES:  PENICILLIN.   MEDICATIONS:  None.   SOCIAL HISTORY:  She is married, with 2 children.  She is a former smoker.  Occasional alcohol use.   FAMILY HISTORY:  She is adopted.   REVIEW OF SYSTEMS:  CARDIOVASCULAR:  She denies any hypertension or known  heart disease.  PULMONARY:  Denies any asthma, pneumonia, or tuberculosis.  Does have sleep apnea and uses a CPAP machine.  Apparently, after her last C-  section she had an apneic episode as  well.  GI:  She denies any hepatitis,  jaundice, or peptic ulcer disease.  She says when she vomited first there  may have been a little blood in it, but since then it has just been clear  fluid.  GU:  No kidney stones or urinary tract infections.  ENDOCRINE:  No  thyroid disease, no hypercholesterolemia.  HEMATOLOGIC:  No blood clots or  known bleeding disorders.   PHYSICAL EXAMINATION:  GENERAL:  A morbidly obese female.  She states she is  about 5 feet tall and weighs around 300 pounds.  She is in no acute  distress.  VITAL SIGNS:  Temperature 98; blood pressure 117/54; pulse 95; saturations  95%.  EYES:  Extraocular motions intact.  No icterus.  NECK:  Very thick, but no obvious masses noted.  RESPIRATORY:  The breath sounds are equal and clear.  Respirations are  unlabored.  CARDIOVASCULAR:  Demonstrates a regular rate and rhythm.  No lower extremity  edema.  ABDOMEN:  Soft, morbidly obese.  There is a midline scar present, without  hernia.  There is right upper quadrant pain to palpation.  Intermittently,  she has a Murphy's sign.  At other times, she does not.  No obvious masses  noted.  MUSCULOSKELETAL:  No muscular wasting evident.  SKIN:  No jaundice.   LABORATORY DATA:  White cell count 8,800, with a left shift, hemoglobin  14.1.  Glucose 152, rest of the electrolytes within normal limits.  SGOT is  96, SGPT 59, rest of the liver functions within normal limits.  Amylase,  lipase normal.  Urine pregnancy test negative.   Ultrasound reviewed.   IMPRESSION:  1.  Persistent biliary colic, with known cholelithiasis, despite pain      medication.  This may be early evolving acute cholecystitis.  2.  Morbid obesity, with a BMI of about 59.  3.  Sleep apnea.   PLAN:  Admit.  IV antibiotics.  We discussed also doing cholecystectomy.  I  talked about the laparoscopic and possible open cholecystectomy.  We went  over the procedure and the risks.  The risks include but are not  limited to  bleeding, infection, wound healing problems, anesthesia, accidental injury  to the common bile duct/liver/intestines, bile leak, and cholecystectomy  diarrhea.  We also talked about the increased difficulty because of her  size.  She seems to understand all these things and agrees with the plan.      Adolph Pollack, M.D.  Electronically Signed     TJR/MEDQ  D:  09/25/2005  T:  09/25/2005  Job:  [id number]

## 2011-01-26 NOTE — Op Note (Signed)
NAME:  [name], [name]                ACCOUNT NO.:  [id number]   MEDICAL RECORD NO.:  [id number]          PATIENT TYPE:  INP   LOCATION:  9373                          FACILITY:  [facility]   PHYSICIAN:  Maxie Better, M.D.DATE OF BIRTH:  [birth year]   DATE OF PROCEDURE:  01/01/2005  DATE OF DISCHARGE:                                 OPERATIVE REPORT   PREOPERATIVE DIAGNOSES:  1.  Fetal macrosomia.  2.  Class A-2 gestational diabetes.  3.  Previous cesarean section x2.  4.  Desires sterilization.  5.  Morbid obesity(BMI 66)  6.  Intrauterine gestation at 38+ weeks.   PREOPERATIVE DIAGNOSES:  1.  Fetal macrosomia.  2.  Class A-2 gestational diabetes.  3.  Desires sterilization.  4.  Intrauterine gestation at 38+ weeks.  5.  Morbid obesity(BMI 66)  6.  Previous cesarean section x2.   PROCEDURES:  1.  Repeat cesarean section, classical hysterotomy.  2.  Modified Pomeroy tubal ligation.   ANESTHESIA:  Spinal.   SURGEON:  Maxie Better, M.D.   ASSISTANT:  Richardean Sale, M.D.   FINDINGS:  Live female weighing 11 pounds 12 ounces, Apgars of 9 and 9, and  anterior placenta with a velamentous insertion, sent to pathology.  Normal  tubes and ovaries bilaterally.  Omental adhesions near the lower anterior  abdominal wall.   PROCEDURE:  Under adequate spinal anesthesia, the patient was placed in the  supine position with a left lateral tilt.  Due to her large pannus, a  decision was made to perform a vertical skin incision.  The patient was  sterilely prepped and draped in the usual fashion after an indwelling Foley  catheter was sterilely placed, and demarcation of where the pannus ended on  her abdomen was noted by a marking pen.  An incision was made about three  inches above the umbilicus and around the umbilicus and about three inches  inferiorly in a vertical fashion.  The incision was then carried down to the  rectus fascia and the rectus fascia was easily identified  superiorly.  There  was 7 cm  depth of subcutaneous fat before the rectus fascia was reached on  the inferior aspect of the vertical incision.  The fascia was opened  superiorly and was extended inferiorly to the level of where the bladder  reflection would be.  The uterus was noted to be large and dextrorotated.  The omentum was noted to be deep in the pelvis and adherent to low anterior  abdominal wall.  The uterus itself had no adhesions.  The decision was made  to perform a vertical incision on the uterus based on the estimated size of  the baby of 15 pounds 14 ounces by ultrasound.  A classical incision was  then made on the uterus and extended superiorly and inferiorly.  The  placenta was anterior.  On entering the uterine cavity through the placental  area, the vertex was noted to be floating.  Given the concern for possibly  not being able to deliver the baby from the vertex position, a decision was  then made  to deliver the baby in a breech fashion.  The baby was delivered  by lifting the legs and the baby was bulb-suctioned on the abdomen and the  cord was clamped, cut.  The baby was transferred to awaiting pediatrician,  who assigned Apgars of 9 and 9 at one and five minutes.  The placenta was  removed.  The uterine cavity was cleaned of the debris.  The uterus was  exteriorized.  The uterine incision was in the midline and was closed with 0  Monocryl running locked stitch first layer, the second layer was imbricated  with a 0 Monocryl suture with good hemostasis noted.  Attention was then  turned to the tubes and ovaries.  Both ovaries are normal.  The tubes are  identified to their fimbriated end bilaterally.  A midportion of the left  fallopian tube was then grasped with a Babcock.  The underlying mesosalpinx  was opened with cautery and the intervening segment of fallopian tube was  tied with 0 chromic proximally and distally x2 and the intervening segment  removed. The same  procedure was performed on the contralateral side.  The  uterus was then returned to the abdomen.  The abdomen was copiously  irrigated and suctioned.  The omental adhesion had no area of defect or  concern for bowel problems, and therefore some of the adhesion of the  omentum was taken down but not all due to the depth in the pelvis.  The  abdomen was irrigated, suctioned of debris, good hemostasis noted on the  incision.  The rectus fascia and the peritoneum was then closed in one layer  using 0 PDS double-looped proximally and distally.  The subcutaneous area  was irrigated, the small bleeders cauterized, and approximated using  interrupted 2-0 plain sutures, and then skin was approximated using Ethicon  staples.  Specimen was placenta  and portion of right and left fallopian  tubes were sent to pathology.  Cord bloods were obtained.  Estimated blood  loss was 1000 mL.  Intraoperative fluid was 2900 mL crystalloid.  Urine  output was 50 mL concentrated urine.  Sponge and instrument count x3 was  correct.  Complication was none.  The patient tolerated the procedure well,  was transferred to the recovery room in stable condition.      Maili/MEDQ  D:  01/01/2005  T:  01/02/2005  Job:  [id number]

## 2011-01-26 NOTE — H&P (Signed)
NAMERavynn, [name]                ACCOUNT NO.:  [id number]   MEDICAL RECORD NO.:  [id number]          PATIENT TYPE:  WOC   LOCATION:  WOC                          FACILITY:  [facility]   PHYSICIAN:  IT trainer, M.D.DATE OF BIRTH:  05/06/1976   DATE OF ADMISSION:  12/29/2004  DATE OF DISCHARGE:                                HISTORY & PHYSICAL   CHIEF COMPLAINT:  Previous cesarean section x 2, desires permanent  sterilization, class A2 gestational diabetes.   HISTORY OF PRESENT ILLNESS:  A 35 year old gravida 4 para 2-0-1-2 morbidly  obese gravid married white female, EDC by ultrasound of Jan 10, 2005, who is  now 38+ weeks gestation, being admitted for repeat cesarean section and  tubal ligation secondary to previous cesarean section x 2 and fetal  macrosomia.  The patient's prenatal course has been complicated by  gestational diabetes, for which the patient has been on glyburide, with  fetal surveillance and diabetes management by maternal fetal medicine at  [facility]/Dr. Sherrie [name].  Last ultrasound on December 27, 2004 revealed estimated fetal  weight of 15 pounds 14 ounces.  Antepartum testing has been reassuring with  nonstress test and/or biophysical profile.  Her last antepartum testing was  on December 29, 2004.  The patient is also noted to have proteinuria.  Her last  24-hour urine collection on December 18, 2004 showed 1,780 mg of protein in a  24-hour period.  The patient, however, was found to have proteinuria on  June 19, 2004 of 450 mg in 24 hours, and she was referred to a  nephrologist at that time.  The patient's PIH labs were normal, with the  exception of hyperuricemia.  The patient has noted fetal movements, no  vaginal bleeding, no rupture of membranes.  Prenatal care is at [facility].  Primary obstetrician:  [name], M.D.   PRENATAL LABORATORIES:  Blood type is O positive, antibody screen is  negative, RPR is nonreactive, rubella is immune, hepatitis B  surface antigen  is negative, HIV test was negative.  First trimester genetic screen was  normal at 12 weeks and 1 day.  She had an anatomic fetal survey on December 17, 2003 at the [facility] consultation.  She had subsequent ultrasounds  with that facility.  Her one-hour glucose challenge test was abnormal at  162.  Three-hour GTT was 90 at fasting, 210 at one hour, two hours at 179,  and three hours at 86.  Her last hemoglobin A1c on December 27, 2004 was 6.5.  AFP for open neural tube defect was normal.  Group B Streptococcus culture  is negative on January 07, 2005.   ALLERGIES:  PENICILLIN.  Blisters and rash.   MEDICATIONS:  1.  Prenatal vitamins.  2.  Glyburide 2.5 in the a.m., and 3.75 p.m.   PAST MEDICAL HISTORY:  Morbid obesity.   PAST SURGICAL HISTORY:  1.  D&E, 1995.  2.  December 1997, primary cesarean section, breech, 5 pound 10 ounce baby.      Was given for adoption.  3.  September 2002, 8 pounds 2 ounces  cesarean section.  Pregnancy      complicated by diabetes and proteinuria.   FAMILY HISTORY:  Negative.   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  Nonsmoker.   REVIEW OF SYSTEMS:  Negative.   PHYSICAL EXAMINATION:  GENERAL:  Morbidly obese gravid female.  Mouth  breathes.  VITAL SIGNS:  Weight 360 pounds; blood pressure 138/90; fetal heart rate  146.  SKIN:  No lesions.  HEENT:  Anicteric sclerae.  Pink conjunctivae.  Oropharynx negative.  HEART:  Regular rate and rhythm, without murmur.  LUNGS:  Clear to auscultation.  BREASTS:  Soft, nontender.  No palpable mass.  ABDOMEN:  Obese, gravid. Peau d'orange skin changes in the lower abdomen.  Large pannus extending below the mons pubis.  Fundal height greater than 50  cm.  PELVIC:  Cervix long, closed.  Presenting part not in the pelvis.  EXTREMITIES:  1+ edema.   IMPRESSION:  1.  Previous cesarean section x 2.  2.  Intrauterine gestation at 38+ weeks.  3.  Fetal macrosomia.  4.  Class A2  gestational diabetes.  5.  Desires sterilization.  6.  Morbid obesity.   PLAN:  1.  Admission.  2.  Repeat cesarean section.  3.  Tubal ligation.  4.  Antibiotic prophylaxis and anti-embolic stockings  5.  Follow up diabetes and proteinuria eight weeks postpartum.  6.  Admission orders and labs for surgery.  7.  Anesthesia consultation done on December 29, 2004.      [place]/MEDQ  D:  01/01/2005  T:  01/01/2005  Job:  [id number]

## 2011-01-26 NOTE — Discharge Summary (Signed)
NAMELeona, [name] [name]                ACCOUNT NO.:  [id number]   MEDICAL RECORD NO.:  [id number]          PATIENT TYPE:  INP   LOCATION:  9373                          FACILITY:  [facility]   PHYSICIAN:  Maxie Better, M.D.DATE OF BIRTH:  16-Feb-1976   DATE OF ADMISSION:  01/01/2005  DATE OF DISCHARGE:  01/05/2005                                 DISCHARGE SUMMARY   ADMISSION DIAGNOSES:  1.  Fetal macrosomia.  2.  Previous cesarean section x2.  3.  Desires sterilization.  4.  Class A2 gestational diabetes.  5.  Morbid obesity.   DISCHARGE DIAGNOSES:  1.  Term gestation, delivered.  2.  Previous cesarean section x2.  3.  Class A2 gestational diabetes.  4.  Desires sterilization.  5.  Fetal macrosomia.  6.  Morbid obesity.  7.  Atypical preeclampsia.  8.  Respiratory arrest secondary to obstructive sleep apnea.   PROCEDURE:  Repeat cesarean section, modified Pomeroy tubal ligation.   HISTORY OF PRESENT ILLNESS:  A 35 year old gravida 4, para 2-0-1-2, morbidly  obese married white female at 38+ weeks gestation with class A2 gestational  diabetes, admitted for repeat cesarean section secondary to estimated fetal  weight of 15 pounds 14 ounces.  The patient has been diagnosed with class A2  gestational diabetes for which she was managed by the perinatologist with  Glyburide.  The patient has been found to have proteinuria during her  pregnancy, however, no evidence of preeclampsia was noted during that time.   HOSPITAL COURSE:  The patient was admitted to [facility].  She had  been previously seen by the anesthesiologist due to her morbid obesity and a  BMI of 66.  The patient was taken to the operating room where she underwent  a repeat cesarean section via classical hysterotomy and modified Pomeroy  tubal ligation.  The procedure resulted in the delivery of a live female,  weighing 11 pounds 12 ounces, Apgars of 9 and 9.  There was omental  adhesions in the lower pelvis,  normal tubes and ovaries were noted at the  time.  Her postoperative course was remarkable for respiratory distress  noted on the evening of postoperative day 0.  The patient was found by the  pediatrician to have cyanosis around the mouth and unresponsive.  Her blood  pressure was normal and her heart rate was normal.  Her O2 saturation was at  the 43rd percentile.  The patient was intubated without difficulty and  transferred to the intensive care unit for further management.  Pulmonary/critical care consultation was obtained.  The patient's blood  sugar was 148 during this time.  The patient remained intubated during the  night.  She was seen by Oley Balm. Simonds, M.D. [facility]  and continued with  ventilator support and management per the pulmonologist.  Chest x-ray had  been obtained that showed low volume, pulmonary vascular congestion, but no  pulmonary edema or infiltrate.  Thyroid studies were also obtained.  The  patient was subsequently extubated.  She was given a dose of Lasix secondary  to the thought that she had a  fluid imbalance.  She was given Dexamethasone.  The patient complained of a headache for which Toradol has helped with her  symptomatology.  Her blood pressure remained normal.  Labs were notable for  SGOT of 51, SGPT of 57, uric acid of 10.1.  Her platelet count was in the  240,000 range.  Her hematocrit was 31.2.  The patient's urine had protein,  however, that was already noted during her pregnancy.  Please see the  dictated H&P for this specific detail.  Based on the laboratory findings,  atypical preeclampsia was diagnosed.  She was started on magnesium sulfate  and continued with apneic management with BiPAP.  DVT prophylaxis using  Lovenox was utilized.  The magnesium was continued until the patient began  diuresing at which time the magnesium was discontinued.  Her diet was  advanced.  The patient was feeling much better.  By postoperative day #4 she  was deemed  well to be discharged.  Arrangements were made for BiPAP/CPAP to  be available at home.  The patient was discharged home.  Her incision which  was a vertical skin incision had staples, no erythema, induration, or  exudate.  The staples will remain until they will be removed at the office.  The lower portion of her abdomen had peau d'orange changes.   DISPOSITION:  Home.   DISCHARGE MEDICATIONS:  1.  Nasonex twice a day.  2.  Afrin spray q.h.s.  3.  Motrin 800 mg one p.o. q.6-8 hours p.r.n. pain.  4.  Tylox one to two tablets every 3 to 4 hours p.r.n. pain.  5.  Continue prenatal vitamins.   FOLLOW UP:  Follow up in the office for staple removal in the coming week.  2-hour Glucose Challenge Test at 8 weeks postpartum.  Follow-up with  pulmonary specialist as per their instructions and to schedule outpatient  sleep study.   DISCHARGE INSTRUCTIONS:  Per the postpartum booklet given.  PIH warning  signs were reviewed.      [place]/MEDQ  D:  01/28/2005  T:  01/29/2005  Job:  [id number]

## 2011-01-30 ENCOUNTER — Encounter (INDEPENDENT_AMBULATORY_CARE_PROVIDER_SITE_OTHER): Payer: Self-pay | Admitting: Physician Assistant

## 2011-01-30 DIAGNOSIS — F332 Major depressive disorder, recurrent severe without psychotic features: Secondary | ICD-10-CM

## 2011-02-01 ENCOUNTER — Encounter (HOSPITAL_COMMUNITY): Payer: Self-pay | Admitting: Psychology

## 2011-02-12 ENCOUNTER — Encounter (HOSPITAL_BASED_OUTPATIENT_CLINIC_OR_DEPARTMENT_OTHER): Payer: 59 | Admitting: Psychology

## 2011-02-12 DIAGNOSIS — F330 Major depressive disorder, recurrent, mild: Secondary | ICD-10-CM

## 2011-02-26 ENCOUNTER — Encounter (HOSPITAL_BASED_OUTPATIENT_CLINIC_OR_DEPARTMENT_OTHER): Payer: 59 | Admitting: Psychology

## 2011-02-26 DIAGNOSIS — F330 Major depressive disorder, recurrent, mild: Secondary | ICD-10-CM

## 2011-03-06 ENCOUNTER — Encounter (INDEPENDENT_AMBULATORY_CARE_PROVIDER_SITE_OTHER): Payer: 59 | Admitting: Physician Assistant

## 2011-03-06 DIAGNOSIS — F332 Major depressive disorder, recurrent severe without psychotic features: Secondary | ICD-10-CM

## 2011-03-19 ENCOUNTER — Encounter (HOSPITAL_BASED_OUTPATIENT_CLINIC_OR_DEPARTMENT_OTHER): Payer: 59 | Admitting: Psychology

## 2011-03-19 DIAGNOSIS — F330 Major depressive disorder, recurrent, mild: Secondary | ICD-10-CM

## 2011-04-02 ENCOUNTER — Encounter (HOSPITAL_COMMUNITY): Payer: 59 | Admitting: Psychology

## 2011-04-09 ENCOUNTER — Encounter (HOSPITAL_COMMUNITY): Payer: 59 | Admitting: Psychology

## 2011-04-16 ENCOUNTER — Encounter (INDEPENDENT_AMBULATORY_CARE_PROVIDER_SITE_OTHER): Payer: 59 | Admitting: Psychology

## 2011-04-16 DIAGNOSIS — F411 Generalized anxiety disorder: Secondary | ICD-10-CM

## 2011-04-16 DIAGNOSIS — F330 Major depressive disorder, recurrent, mild: Secondary | ICD-10-CM

## 2011-04-30 ENCOUNTER — Encounter (HOSPITAL_COMMUNITY): Payer: 59 | Admitting: Psychology

## 2011-04-30 DIAGNOSIS — F330 Major depressive disorder, recurrent, mild: Secondary | ICD-10-CM

## 2011-04-30 DIAGNOSIS — F411 Generalized anxiety disorder: Secondary | ICD-10-CM

## 2011-05-16 ENCOUNTER — Encounter (HOSPITAL_COMMUNITY): Payer: 59 | Admitting: Psychology

## 2011-05-22 ENCOUNTER — Encounter (HOSPITAL_COMMUNITY): Payer: 59 | Admitting: Psychology

## 2011-05-31 LAB — CBC
Hemoglobin: 14.5
MCHC: 35.2
Platelets: 361
RBC: 4.84
WBC: 4.9

## 2011-05-31 LAB — BASIC METABOLIC PANEL
CO2: 25
Chloride: 102
Creatinine, Ser: 0.73
GFR calc non Af Amer: >60
Potassium: 4

## 2011-05-31 LAB — DIFFERENTIAL
Basophils Relative: 0
Lymphocytes Relative: 16
Lymphs Abs: 0.8
Monocytes Relative: 6
Neutrophils Relative %: 77

## 2011-06-04 ENCOUNTER — Encounter (INDEPENDENT_AMBULATORY_CARE_PROVIDER_SITE_OTHER): Payer: 59 | Admitting: Psychology

## 2011-06-04 DIAGNOSIS — F411 Generalized anxiety disorder: Secondary | ICD-10-CM

## 2011-06-04 DIAGNOSIS — F330 Major depressive disorder, recurrent, mild: Secondary | ICD-10-CM

## 2011-06-05 ENCOUNTER — Encounter (HOSPITAL_COMMUNITY): Payer: 59 | Admitting: Physician Assistant

## 2011-06-05 LAB — DIFFERENTIAL
Basophils Absolute: 0
Basophils Relative: 0
Eosinophils Relative: 1
Lymphs Abs: 1.2
Neutrophils Relative %: 79 — ABNORMAL HIGH

## 2011-06-05 LAB — POCT I-STAT, CHEM 8
Chloride: 102
Creatinine, Ser: 0.8
Glucose, Bld: 258 — ABNORMAL HIGH
Hemoglobin: 14.3
Potassium: 4
Sodium: 136

## 2011-06-05 LAB — URINALYSIS, ROUTINE W REFLEX MICROSCOPIC
Glucose, UA: 250 — AB
Nitrite: NEGATIVE
Specific Gravity, Urine: 1.031 — ABNORMAL HIGH
pH: 6

## 2011-06-05 LAB — CBC
Hemoglobin: 14
MCHC: 35
MCV: 87.2
RBC: 4.6
RDW: 13.8

## 2011-06-05 LAB — WET PREP, GENITAL: WBC, Wet Prep HPF POC: NONE SEEN

## 2011-06-05 LAB — URINE MICROSCOPIC-ADD ON

## 2011-06-12 ENCOUNTER — Encounter (INDEPENDENT_AMBULATORY_CARE_PROVIDER_SITE_OTHER): Payer: 59 | Admitting: Physician Assistant

## 2011-06-12 DIAGNOSIS — F332 Major depressive disorder, recurrent severe without psychotic features: Secondary | ICD-10-CM

## 2011-06-12 DIAGNOSIS — F411 Generalized anxiety disorder: Secondary | ICD-10-CM

## 2011-06-25 ENCOUNTER — Encounter (HOSPITAL_COMMUNITY): Payer: 59 | Admitting: Psychology

## 2011-07-09 ENCOUNTER — Encounter (INDEPENDENT_AMBULATORY_CARE_PROVIDER_SITE_OTHER): Payer: 59 | Admitting: Psychology

## 2011-07-09 DIAGNOSIS — F411 Generalized anxiety disorder: Secondary | ICD-10-CM

## 2011-07-25 ENCOUNTER — Ambulatory Visit (INDEPENDENT_AMBULATORY_CARE_PROVIDER_SITE_OTHER): Payer: 59 | Admitting: Psychology

## 2011-07-25 ENCOUNTER — Encounter (HOSPITAL_COMMUNITY): Payer: Self-pay | Admitting: Psychology

## 2011-07-25 DIAGNOSIS — F411 Generalized anxiety disorder: Secondary | ICD-10-CM

## 2011-07-25 NOTE — Progress Notes (Signed)
   THERAPIST PROGRESS NOTE  Session Time: 10:20am-11:20am  Participation Level: Active  Behavioral Response: Fairly GroomedAlertDepressed  Type of Therapy: Individual Therapy  Treatment Goals addressed: Diagnosis: MDD, GAD and goal1.   Interventions: CBT, Strength-based and Reframing  Summary: [name] is a 35 y.o. female who presents with MDD, GAD.  Pt affect depressed, pt talk negative.  Pt expressed major stressor of house hunting and not finding the "right" house and discouraged as offered on a house, but was outbid.  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  Pt was guarded and reported have been hiding for years some of her medical hx that reportedly on verge of sharing, but didn't in session.  Pt was able to express that this problem she didn't share does effect how she thinks of herself and aware related to "how hard I am on myself".  Pt choose not to share in session.   Suicidal/Homicidal: Nowithout intent/plan  Therapist Response: Assessed pt functioning per her report.  Challenged pt on cognitive distortions and ways of acknowledging thought and reframing thinking.  Reflected pt difficulty in expressing what she has been hiding. Psychoeducation that will be important to address in session inorder to met goals as seems linked w/ self image.  Offered supportive non judging environment- but place pt in control of what she will or will not share.  Plan: Return again in 2-3 weeks.  Diagnosis: Axis I: Generalized Anxiety Disorder and Major Depression, Recurrent Moderate    Axis II: Deferred    YATES,LEANNE, LPC 07/25/2011

## 2011-08-14 ENCOUNTER — Ambulatory Visit (INDEPENDENT_AMBULATORY_CARE_PROVIDER_SITE_OTHER): Payer: 59 | Admitting: Physician Assistant

## 2011-08-14 DIAGNOSIS — F331 Major depressive disorder, recurrent, moderate: Secondary | ICD-10-CM

## 2011-08-14 DIAGNOSIS — F411 Generalized anxiety disorder: Secondary | ICD-10-CM

## 2011-08-14 NOTE — Progress Notes (Signed)
   [facility] Follow-up Outpatient Visit  [name] January 05, 1976  Date: 08/14/11   Subjective: [name] reports that this is a difficult day for her. On this date 15 years ago. Her daughter was born. She gave his daughter, [name], up for adoption. Otherwise, she reports she is doing very well. Her sleep has improved with the increase in Remeron. She feels that her depression and anxiety are well controlled. She did not start the Zoloft as she is waiting for [employer] to get for her. She states that her appetite is somewhat decreased, which she is happy about. She denies any suicidal or homicidal ideation. She denies any auditory or visual hallucinations. She reports that her family has inherited some of $125,000 from a deceased aunt.  There were no vitals filed for this visit.  Mental Status Examination  Appearance: Casual Alert: Yes Attention: good  Cooperative: Yes Eye Contact: Good Speech: Clear and even Psychomotor Activity: Normal Memory/Concentration: Intact Oriented: person, place, time/date and situation Mood: Euthymic Affect: Congruent Thought Processes and Associations: Linear Fund of Knowledge: Good Thought Content:  Insight: Fair Judgement: Fair  Diagnosis: Maj. depressive disorder, recurrent, moderate, generalized anxiety disorder.  Treatment Plan: We will continue the Wellbutrin and Remeron as prescribed, and she will begin the Zoloft when she is able to obtain it. She will return for a followup visit in 3 months.  [name] Herrera, PA

## 2011-08-20 ENCOUNTER — Ambulatory Visit (HOSPITAL_COMMUNITY): Payer: 59 | Admitting: Psychology

## 2011-08-23 ENCOUNTER — Ambulatory Visit (HOSPITAL_COMMUNITY): Payer: 59 | Admitting: Psychology

## 2011-09-17 ENCOUNTER — Ambulatory Visit (INDEPENDENT_AMBULATORY_CARE_PROVIDER_SITE_OTHER): Payer: 59 | Admitting: Psychology

## 2011-09-17 DIAGNOSIS — F411 Generalized anxiety disorder: Secondary | ICD-10-CM

## 2011-09-17 NOTE — Progress Notes (Addendum)
   THERAPIST PROGRESS NOTE  Session Time: 1pm-1:45pm  Participation Level: Active  Behavioral Response: Fairly GroomedAlertBlunted  Type of Therapy: Individual Therapy  Treatment Goals addressed: Diagnosis: MDD, GAD and goal1.   Interventions: CBT, Motivational Interviewing and Supportive  Summary: [name] is a 36 y.o. female who presents with blunted affect.  Pt reported that her Christmas was "horrible" as illness that went around w/ kids and herself.  Pt also reported that she is on pain medication and antiinflammatory for swelling in her wrist.  Pt also informed that her husband felt is was important to notify counselor that her kidneys are failing.  Pt reported that on recent visit to PCP blood work indicated kidney failure and PCP had remarked that she was "surprised she hadn't been to the hospital".  Pt seemed to have poor insight into the meaning of this medical issue and acknowledged that she is in denial about this and avoidance from further researching. Pt reproted that she has done this at other times as well w/ medical issues- feeling that hasn't changed even despite a "wakeup call" following pregnancy w/ 6y/o daughter.  Pt agreed to create a list of questions she has for PCP at her 10/08/11 f/u appointment.     Suicidal/Homicidal: Nowithout intent/plan  Therapist Response: Assessed pt functioning per her report.  Explored w/ pt recent interactions w/ family and holiday functioning.  Processed w/ pt recent dx of kidney failure, her thoughts and feelings about.  Reflected to pt lack of reaction and potential of denial/avoidance.  Used motivational interviewing to explore pt wants, Discussed w/ pt pattern she identified w/ lack of self care due to denial and avoidance and how this is impacting.  Encouraged pt need for further information from appropriate professional as began inquiring from counselor about questions. Plan: Return again in 2-3 weeks.  Diagnosis: Axis I:  Generalized Anxiety Disorder     Axis II: Deferred    Margretta Sidle 09/17/2011  [facility] Outpatient Therapist Documentation Restriction  Forde Radon, [facility] 10/23/2011

## 2011-09-25 ENCOUNTER — Other Ambulatory Visit (HOSPITAL_COMMUNITY): Payer: Self-pay | Admitting: *Deleted

## 2011-10-01 ENCOUNTER — Ambulatory Visit (INDEPENDENT_AMBULATORY_CARE_PROVIDER_SITE_OTHER): Payer: 59 | Admitting: Psychology

## 2011-10-01 DIAGNOSIS — F411 Generalized anxiety disorder: Secondary | ICD-10-CM

## 2011-10-01 NOTE — Progress Notes (Addendum)
   THERAPIST PROGRESS NOTE  Session Time: 9:30am-10:20am  Participation Level: Active  Behavioral Response: Fairly GroomedAlertEuthymic  Type of Therapy: Individual Therapy  Treatment Goals addressed: Diagnosis: MDD, GAD and goal1.   Interventions: CBT and Supportive  Summary: [name] is a 37 y.o. female who presents with full and bright affect.  Pt expressed excitement and nervousness as they are scheduled to close on their new home on 10/10/11.  Pt reported on all the plans and preparations for this move and change.  Pt expressed worry w/ new neighbors and transition of understanding son's illness.  Pt was able to make positive reframes when making cognitive distortions of castorophy statements.   Suicidal/Homicidal: Nowithout intent/plan  Therapist Response: Assessed pt functioning per her report. Reflected pt excitement and explored w/ pt transition of moving to new home.  Assisted pt in reflecting cognitive distortions and encouraging reframing statements. Plan: Return again in 2-3 weeks.  Diagnosis: Axis I: Generalized Anxiety Disorder     Axis II: Deferred    [name] 10/01/2011  [facility] Outpatient Therapist Documentation Restriction  [name], [facility] 10/23/2011

## 2011-10-03 NOTE — Progress Notes (Signed)
Addended by: Clarene Essex on: 10/03/2011 10:03 AM   Modules accepted: Level of Service

## 2011-10-18 ENCOUNTER — Other Ambulatory Visit (HOSPITAL_COMMUNITY): Payer: Self-pay | Admitting: *Deleted

## 2011-10-22 ENCOUNTER — Ambulatory Visit (INDEPENDENT_AMBULATORY_CARE_PROVIDER_SITE_OTHER): Payer: 59 | Admitting: Psychology

## 2011-10-22 DIAGNOSIS — F411 Generalized anxiety disorder: Secondary | ICD-10-CM

## 2011-10-22 NOTE — Progress Notes (Signed)
   THERAPIST PROGRESS NOTE  Session Time: 9.28am-10:25am  Participation Level: Active  Behavioral Response: Fairly GroomedAlertIrritable  Type of Therapy: Individual Therapy  Treatment Goals addressed: Diagnosis: GAD and MDD and goal 1.  Interventions: CBT and Reframing  Summary: Akiya L Vallely is a 36 y.o. female who presents with affect congruent w/ negative attitude and complaints of stressors of moving.  Pt reported on move to new house on 10/19/11 and focused on reporting the negative of the moving process.  Pt was able to reframe w/ counselor assistance and focus on the benefits and see that stressors are temporary and gain long term.   Suicidal/Homicidal: Nowithout intent/plan  Therapist Response: Assessed pt current functioning per her report.  Processed w/pt the move to the new home and stressors that are- normalizing these as typical in move process.  Assisted pt w/ reframing about negatives and focusing on the positives of the move.  Plan: Return again in 3 weeks.  Diagnosis: Axis I: MDD and GAD    Axis II: No diagnosis    Camiah Humm, LPC 10/22/2011

## 2011-11-12 ENCOUNTER — Ambulatory Visit (HOSPITAL_COMMUNITY): Payer: 59 | Admitting: Physician Assistant

## 2011-11-13 ENCOUNTER — Ambulatory Visit (HOSPITAL_COMMUNITY): Payer: Self-pay | Admitting: Psychology

## 2011-11-13 ENCOUNTER — Telehealth (HOSPITAL_COMMUNITY): Payer: Self-pay | Admitting: Psychology

## 2011-11-13 NOTE — Telephone Encounter (Signed)
Pt missed appointment today and counselor attempted to call, but phone number not in service

## 2011-11-27 ENCOUNTER — Encounter (HOSPITAL_COMMUNITY): Payer: Self-pay | Admitting: *Deleted

## 2011-11-27 NOTE — Progress Notes (Signed)
Received faxed refill request from [facility] on 11/19/11.for Remeron and Zoloft. Denied by [facility].Last appt 08/14/11, no show on 11/12/11. Per Hessie Diener, contact patient to make appt and RX would be filled.Office staff attempted to contact pt 3/11 and 3/12 at [phone]--number no longer in service. Faxed back to Health Dept on 3/19 as still not heard from patient with this response: "Will not fill until patient seen in this office by provider."

## 2011-12-13 ENCOUNTER — Other Ambulatory Visit (HOSPITAL_COMMUNITY): Payer: Self-pay

## 2012-01-04 ENCOUNTER — Telehealth (HOSPITAL_COMMUNITY): Payer: Self-pay | Admitting: Psychology

## 2012-01-04 NOTE — Telephone Encounter (Signed)
Attempted to call pt to inquire about tx intentions as pt last seen 10/22/11.  Automated message stated not available at this time.

## 2012-01-11 ENCOUNTER — Emergency Department (HOSPITAL_COMMUNITY)
Admission: EM | Admit: 2012-01-11 | Discharge: 2012-01-11 | Disposition: A | Discharge status: Home / Self Care | Payer: Self-pay | Attending: Emergency Medicine | Admitting: Emergency Medicine

## 2012-01-11 ENCOUNTER — Emergency Department (HOSPITAL_COMMUNITY): Payer: Self-pay

## 2012-01-11 ENCOUNTER — Encounter (HOSPITAL_COMMUNITY): Payer: Self-pay

## 2012-01-11 DIAGNOSIS — R358 Other polyuria: Secondary | ICD-10-CM | POA: Insufficient documentation

## 2012-01-11 DIAGNOSIS — R10819 Abdominal tenderness, unspecified site: Secondary | ICD-10-CM | POA: Insufficient documentation

## 2012-01-11 DIAGNOSIS — R599 Enlarged lymph nodes, unspecified: Secondary | ICD-10-CM | POA: Insufficient documentation

## 2012-01-11 DIAGNOSIS — R590 Localized enlarged lymph nodes: Secondary | ICD-10-CM

## 2012-01-11 DIAGNOSIS — R110 Nausea: Secondary | ICD-10-CM | POA: Insufficient documentation

## 2012-01-11 DIAGNOSIS — R3589 Other polyuria: Secondary | ICD-10-CM | POA: Insufficient documentation

## 2012-01-11 DIAGNOSIS — R739 Hyperglycemia, unspecified: Secondary | ICD-10-CM

## 2012-01-11 DIAGNOSIS — E781 Pure hyperglyceridemia: Secondary | ICD-10-CM | POA: Insufficient documentation

## 2012-01-11 DIAGNOSIS — R631 Polydipsia: Secondary | ICD-10-CM | POA: Insufficient documentation

## 2012-01-11 DIAGNOSIS — Z79899 Other long term (current) drug therapy: Secondary | ICD-10-CM | POA: Insufficient documentation

## 2012-01-11 DIAGNOSIS — E119 Type 2 diabetes mellitus without complications: Secondary | ICD-10-CM | POA: Insufficient documentation

## 2012-01-11 LAB — DIFFERENTIAL
Lymphocytes Relative: 22 % (ref 12–46)
Lymphs Abs: 1.6 10*3/uL (ref 0.7–4.0)
Monocytes Relative: 6 % (ref 3–12)
Neutro Abs: 5.2 10*3/uL (ref 1.7–7.7)
Neutrophils Relative %: 72 % (ref 43–77)

## 2012-01-11 LAB — CBC
Hemoglobin: 15.8 g/dL — ABNORMAL HIGH (ref 12.0–15.0)
RBC: 5.29 MIL/uL — ABNORMAL HIGH (ref 3.87–5.11)

## 2012-01-11 LAB — BASIC METABOLIC PANEL
BUN: 16 mg/dL (ref 6–23)
CO2: 21 mEq/L (ref 19–32)
Chloride: 93 mEq/L — ABNORMAL LOW (ref 96–112)
GFR calc Af Amer: >90 mL/min (ref >90)
Glucose, Bld: 526 mg/dL — ABNORMAL HIGH (ref 70–99)
Potassium: 4.3 mEq/L (ref 3.5–5.1)

## 2012-01-11 LAB — URINALYSIS, ROUTINE W REFLEX MICROSCOPIC
Bilirubin Urine: NEGATIVE
Hgb urine dipstick: NEGATIVE
Nitrite: NEGATIVE
Specific Gravity, Urine: 1.044 — ABNORMAL HIGH (ref 1.005–1.030)
pH: 6 (ref 5.0–8.0)

## 2012-01-11 LAB — GLUCOSE, CAPILLARY
Glucose-Capillary: 543 mg/dL — ABNORMAL HIGH (ref 70–99)
Glucose-Capillary: 400 mg/dL — ABNORMAL HIGH (ref 70–99)

## 2012-01-11 LAB — URINE MICROSCOPIC-ADD ON

## 2012-01-11 MED ORDER — SODIUM CHLORIDE 0.9 % IV BOLUS (SEPSIS)
1000.0000 mL | Freq: Once | INTRAVENOUS | Status: AC
  Administered 2012-01-11: 1000 mL via INTRAVENOUS

## 2012-01-11 MED ORDER — IOHEXOL 300 MG/ML  SOLN
100.0000 mL | Freq: Once | INTRAMUSCULAR | Status: AC | PRN
  Administered 2012-01-11: 100 mL via INTRAVENOUS

## 2012-01-11 MED ORDER — CLINDAMYCIN HCL 150 MG PO CAPS: ORAL_CAPSULE | ORAL | Status: DC

## 2012-01-11 MED ORDER — INSULIN ASPART 100 UNIT/ML ~~LOC~~ SOLN
15.0000 [IU] | Freq: Once | SUBCUTANEOUS | Status: AC
  Administered 2012-01-11: 15 [IU] via SUBCUTANEOUS
  Filled 2012-01-11: qty 1

## 2012-01-11 MED ORDER — INSULIN ASPART 100 UNIT/ML ~~LOC~~ SOLN
10.0000 [IU] | Freq: Once | SUBCUTANEOUS | Status: AC
  Administered 2012-01-11: 10 [IU] via SUBCUTANEOUS
  Filled 2012-01-11: qty 1

## 2012-01-11 MED ORDER — INSULIN ASPART PROT & ASPART (70-30 MIX) 100 UNIT/ML ~~LOC~~ SUSP: 10.0000 [IU] | Freq: Once | SUBCUTANEOUS | Status: DC

## 2012-01-11 MED ORDER — METOCLOPRAMIDE HCL 5 MG/ML IJ SOLN
10.0000 mg | Freq: Once | INTRAMUSCULAR | Status: AC
  Administered 2012-01-11: 10 mg via INTRAVENOUS
  Filled 2012-01-11: qty 2

## 2012-01-11 NOTE — ED Provider Notes (Signed)
Patient sent to the emergency department by her family doctor for triglycerides over 2000. Her last lab tests were 1600. However patient's main concern is chest swelling and pain in the left side of her neck. Patient does not have any pictures of her self including a driver's license to compare to today. Patient is noted to have diffuse flushing of her face and neck. She has a very obese neck that Timmy appears to be more swollen on the right than the left however she has tenderness diffusely in the left side of her neck without obvious masses. Patient examined shortly after inflating back from bathroom appears to have some shortness of breath.  Medical screening examination/treatment/procedure(s) were conducted as a shared visit with non-physician practitioner(s) and myself.  I personally evaluated the patient during the encounter Devoria Albe, MD, Franz Dell, MD 01/11/12 [id number]

## 2012-01-11 NOTE — ED Notes (Signed)
[id number]

## 2012-01-11 NOTE — ED Notes (Signed)
Patient reports that she was at her PCP yesterday and when labs resulted patient was called and told to come to the ED to be admitted because her cholesterol was so elevated that she was at risk for a stroke or MI.

## 2012-01-11 NOTE — Discharge Instructions (Signed)
Take antibiotic as prescribed.  Continue your diabetes medications as prescribed. Follow up with Dr. Duanne Guess as soon as possible.  She will need to adjust your cholesterol medication. You should return to the ER if your neck swelling worsens, particularly if you develop difficulty with breathing or swallowing.

## 2012-01-11 NOTE — ED Notes (Signed)
Authorization for release of medical records signed by pt and faxed to [facility] to request pt records.

## 2012-01-11 NOTE — Progress Notes (Signed)
ED CM spoke with pt after noting cm consult for trouble with medications CM reviewed [email or web address] for pt concerns [employer] application for pt assistance program provided Pt also getting assistance already from [employer] program Receives metformin free per pt at [employer] States pcp is Dr Duanne Guess

## 2012-01-11 NOTE — ED Provider Notes (Signed)
History     CSN: [id number]  Arrival date & time 01/11/12  1054   First MD Initiated Contact with Patient 01/11/12 1104      Chief Complaint  Patient presents with  . Hyperglycemia  . Hyperlipidemia    (Consider location/radiation/quality/duration/timing/severity/associated sxs/prior treatment) HPI History provided by pt.   Pt referred to ED by her PCP, Dr. Maryelizabeth Rowan, for admission for severe hypercholesterolemia and therefore risk of acute MI/CVA, as well as hyperglycemia.  BG was greater than 500 and triglyceride level 2200.  Her lantus was increased from 60 units bid to 70 units bid.  She takes metformin, glipizide, crestor and lovaza as well and is compliant w/ all medications.  No prior h/o DKA.  She has recently been experiencing polyuria, polydipsia, intermittent, diffuse abd pain and nausea.  No other urinary sx. Pt reports that she saw Dr. Duanne Guess yesterday for left-sided cervical lymphadenopathy.  She reports that she was evaluated by an ENT for this same problem last year and admitted to [facility]. Per chart from [facility], pt admitted for left neck cellulitis w/ left cervical lymphadenitis.  Was treated w/ IV Vanc.   Has also had mild sinus pressure, nasal congestion, rhinorrhea and intermittent sore throat.  Denies cough and earache.    Past Medical History  Diagnosis Date  . Diabetes mellitus type II   . High cholesterol   . Lower leg, muscle or tendon injury   . 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 Cesarean section     3   . Cholecystectomy     Family History  Problem Relation Age of Onset  . Adopted: Yes  . Autism Son   . ADD / ADHD Son   . Apraxia Son     History  Substance Use Topics  . Smoking status: Former Smoker    Quit date: 07/24/2000  . Smokeless tobacco: Never Used  . Alcohol Use: Yes     occasional use at holidays and with family get  togethers- every couple of months    OB History    Grav Para Term Preterm Abortions TAB SAB Ect Mult Living                  Review of Systems  All other systems reviewed and are negative.    Allergies  Review of patient's allergies indicates no known allergies.  Home Medications   Current Outpatient Rx  Name Route Sig Dispense Refill  . BUPROPION HCL ER (SR) 150 MG PO TB12 Oral Take 150 mg by mouth 2 (two) times daily.      Marland Kitchen MIRTAZAPINE 15 MG PO TABS Oral Take 15 mg by mouth at bedtime.      . SERTRALINE HCL 25 MG PO TABS Oral Take 25 mg by mouth daily.        BP 159/86  Pulse 125  Temp(Src) 97.9 F (36.6 C) (Oral)  Resp 20  SpO2 99%  LMP 11/11/2011  Physical Exam  Nursing note and vitals reviewed. Constitutional: She is oriented to person, place, and time. She appears well-developed and well-nourished. No distress.       Morbidly obese  HENT:  Head: Normocephalic and atraumatic.       Mild enlargement and tenderness of left tonsillar lymph node.  Exam limited because pt morbidly obese and neck very large.  Nml posterior pharynx.  No trismus.  Mucous membranes dry.  Left external ear canal and left TM appear nml.  No edema/erythema or tenderness of left mastoid.    Eyes:       Normal appearance  Neck: Normal range of motion.  Cardiovascular: Normal rate and regular rhythm.   Pulmonary/Chest: Effort normal and breath sounds normal. No respiratory distress.  Abdominal: Soft. Bowel sounds are normal. She exhibits no distension.       Mild diffuse ttp  Genitourinary:       No CVA ttp  Musculoskeletal: Normal range of motion.  Neurological: She is alert and oriented to person, place, and time.       CN 3-12 intact.  No sensory deficits.  5/5 and equal upper and lower extremity strength.  No past pointing.   Skin: Skin is warm and dry. No rash noted.  Psychiatric: She has a normal mood and affect. Her behavior is normal.    ED Course  Procedures (including  critical care time)  Labs Reviewed  GLUCOSE, CAPILLARY - Abnormal; Notable for the following:    Glucose-Capillary 543 (*)    All other components within normal limits  CBC - Abnormal; Notable for the following:    RBC 5.29 (*)    Hemoglobin 15.8 (*)    All other components within normal limits  BASIC METABOLIC PANEL - Abnormal; Notable for the following:    Sodium 131 (*)    Chloride 93 (*)    Glucose, Bld 526 (*)    Creatinine, Ser 0.45 (*)    All other components within normal limits  URINALYSIS, ROUTINE W REFLEX MICROSCOPIC - Abnormal; Notable for the following:    Specific Gravity, Urine 1.044 (*)    Glucose, UA >1000 (*)    Ketones, ur TRACE (*)    Protein, ur 100 (*)    All other components within normal limits  URINE MICROSCOPIC-ADD ON - Abnormal; Notable for the following:    Squamous Epithelial / LPF FEW (*)    All other components within normal limits  GLUCOSE, CAPILLARY - Abnormal; Notable for the following:    Glucose-Capillary 400 (*)    All other components within normal limits  GLUCOSE, CAPILLARY - Abnormal; Notable for the following:    Glucose-Capillary 365 (*)    All other components within normal limits  GLUCOSE, CAPILLARY - Abnormal; Notable for the following:    Glucose-Capillary 330 (*)    All other components within normal limits  DIFFERENTIAL   Ct Soft Tissue Neck W Contrast  01/11/2012  *RADIOLOGY REPORT*  Clinical Data: Left-sided neck swelling.  No trauma.  No fever.  CT NECK WITH CONTRAST  Technique:  Multidetector CT imaging of the neck was performed with intravenous contrast.  Contrast: OMNIPAQUE IOHEXOL 300 MG/ML  SOLN  Comparison: None.  Findings: Slightly lobulated appearance of the parotid glands bilaterally.  Minimal amount of fluid along the inferior aspect of the left parotid gland may be present raising possibility of minimal inflammation otherwise no inflammatory process noted.  No parotid duct dilation or stone identified.    Prominence of posterior-superior nasopharyngeal soft tissue may be related to adenoidal tissue more than expected for patient's age. Mucosal abnormality not entirely excluded.  No secondary findings of eustachian tube dysfunction.  Scattered lymph nodes throughout the neck slightly prominent in size at the level II region but with a fatty containing and therefore benign.  Age advanced mild atherosclerotic type changes with focal calcified plaque at the origin of the  nondominant left vertebral artery with moderate narrowing.  Mild plaque right carotid bifurcation without high-grade stenosis.  Medial deviation of the carotid arteries.  Visualized intracranial structures unremarkable.  Visualized lungs clear.  Cervical spondylotic changes.  IMPRESSION: Slightly lobulated appearance of the parotid glands bilaterally. Minimal amount of fluid along the inferior aspect of the left parotid gland may be present raising possibility of minimal inflammation otherwise no inflammatory process noted.  No parotid duct dilation or stone identified.  Prominence of posterior-superior nasopharyngeal soft tissue may be related to adenoidal tissue more than expected for patient's age. Mucosal abnormality not entirely excluded.  No secondary findings of eustachian tube dysfunction.  Scattered lymph nodes throughout the neck slightly prominent in size at the level II region but with a fatty containing and therefore benign.  Age advanced mild atherosclerotic type changes with focal calcified plaque at the origin of the nondominant left vertebral artery with moderate narrowing.  Mild plaque right carotid bifurcation without high-grade stenosis.  Medial deviation of the carotid arteries.  Cervical spondylotic changes.  Original Report Authenticated By: Fuller Canada, M.D.     1. Hyperglycemia   2. Cervical lymphadenopathy   3. High blood triglycerides       MDM  36yo F w/ h/o diabetes and hypertriglyceridemia referred to ED by  her PCP for admission for elevated triglycerides (2200; up from 1600 4mos ago) and BG (>500; most recent A1c 13.2).  Pt has no h/o DKA and is not acidotic today.  Hyperglycemia treated w/ 3L NS and a total of 25 units subq novolog.  Pt c/o left-sided headache.  She is afebrile, well-appearing, no focal neuro deficits or meningeal signs on exam.  Pain completely resolved w/ 10mg  IV reglan.  Also c/o mildly painful left cervical lymphadenopathy.  Concerned because she had similar sx last year and per reviewed records from [facility], was admitted for cellulitis of left earlobe and neck w/ lymphadenitis.  Exam of neck limited because patient morbidly obese, but no erythema or obvious edema of left neck, left tonsillar lymph node enlarged relative to right, mild "soreness" to palpation of left neck. Dr. Lynelle Doctor examined and recommended CT neck which showed minimal inflammation of parotid glands, prominent but benign lymph nodes and atherosclerotic changes of vertebral/carotid arteries.  Results discussed w/ pt.  Dr. Lynelle Doctor suggested discharging patient home on clinda, but consulting [facility] about patient's high cholesterol first.  Unable to reach her PCP, Dr. Duanne Guess and Dr. Marthann Schiller with [facility] thinks it best that Dr. Duanne Guess adjust her medications.  Pt advised to continue all medications as prescribed for now and f/u with Dr. Duanne Guess asap.  Return precautions discussed.         Otilio Miu, [place] 01/12/12 [phone]

## 2012-01-12 NOTE — ED Provider Notes (Signed)
See prior note Devoria Albe, MD, Franz Dell, MD 01/12/12 1116

## 2012-01-15 ENCOUNTER — Encounter (HOSPITAL_COMMUNITY): Payer: Self-pay | Admitting: Psychology

## 2012-01-29 ENCOUNTER — Other Ambulatory Visit (HOSPITAL_COMMUNITY): Payer: Self-pay | Admitting: Psychology

## 2012-02-29 ENCOUNTER — Other Ambulatory Visit (HOSPITAL_COMMUNITY): Payer: Self-pay | Admitting: Psychology

## 2012-03-18 ENCOUNTER — Telehealth (HOSPITAL_COMMUNITY): Payer: Self-pay

## 2012-03-18 ENCOUNTER — Other Ambulatory Visit (HOSPITAL_COMMUNITY): Payer: Self-pay | Admitting: *Deleted

## 2012-03-18 DIAGNOSIS — F331 Major depressive disorder, recurrent, moderate: Secondary | ICD-10-CM

## 2012-03-18 MED ORDER — MIRTAZAPINE 30 MG PO TABS: 30.0000 mg | ORAL_TABLET | Freq: Every day | ORAL | Status: DC

## 2012-03-18 NOTE — Telephone Encounter (Signed)
03/18/12 s/w pt made an appt./sh

## 2012-04-08 ENCOUNTER — Ambulatory Visit (INDEPENDENT_AMBULATORY_CARE_PROVIDER_SITE_OTHER): Payer: 59 | Admitting: Physician Assistant

## 2012-04-08 DIAGNOSIS — F330 Major depressive disorder, recurrent, mild: Secondary | ICD-10-CM | POA: Insufficient documentation

## 2012-04-08 DIAGNOSIS — F411 Generalized anxiety disorder: Secondary | ICD-10-CM

## 2012-04-08 MED ORDER — HYDROXYZINE HCL 50 MG PO TABS: 50.0000 mg | ORAL_TABLET | Freq: Every day | ORAL | Status: AC

## 2012-04-08 NOTE — Progress Notes (Signed)
   [facility] Follow-up Outpatient Visit  [name] 1976-09-06  Date: 04/08/12   Subjective: [name] presents today to followup on her treatment for anxiety and depression. She reports that she is "busy." She is taking her son horseback riding, and she has moved to the competition side of the dance floor, and she has been to the beach multiple times. When asked about her mood she states "I've been too busy to pay attention." She states that her sleep is so-so, and she goes to bed around 3 or 4 AM, then her kids get her up at 8. She reports that she takes her Remeron around 8:30 or 9 PM. She is uncertain whether or not she is taking Zoloft, but believes that she is. She is no longer seeing her counselor, as she cannot afford it. She denies any suicidal or homicidal ideation. She denies any auditory or visual hallucinations.    There were no vitals filed for this visit.  Mental Status Examination  Appearance: Disheveled Alert: Yes Attention: fair , was distracted by the television while walking from the waiting room Cooperative: Yes Eye Contact: Good Speech: Clear and coherent Psychomotor Activity: Normal Memory/Concentration: Memory is impaired/concentration intact Oriented: person, place, time/date and situation Mood: Anxious and Euthymic Affect: Congruent Thought Processes and Associations: Disorganized and Tangential Fund of Knowledge: Fair Thought Content: Normal Insight: Fair Judgement: Good  Diagnosis: Maj. depressive disorder, recurrent, mild; generalized anxiety disorder  Treatment Plan: We will add Vistaril 50 mg at bedtime to target her time to fall asleep. We'll continue the Wellbutrin XL 150 mg daily and Remeron 30 mg at bedtime. She is to report to [place] whether or not she is taking the Zoloft. She will return for followup in 6 weeks, and she has been instructed to bring all of her medications with her.  Gerado Nabers, PA-C

## 2012-04-15 ENCOUNTER — Other Ambulatory Visit (HOSPITAL_COMMUNITY): Payer: Self-pay | Admitting: *Deleted

## 2012-04-15 DIAGNOSIS — F331 Major depressive disorder, recurrent, moderate: Secondary | ICD-10-CM

## 2012-04-15 MED ORDER — MIRTAZAPINE 30 MG PO TABS: 30.0000 mg | ORAL_TABLET | Freq: Every day | ORAL | Status: DC

## 2012-04-17 ENCOUNTER — Emergency Department (HOSPITAL_COMMUNITY)
Admission: EM | Admit: 2012-04-17 | Discharge: 2012-04-18 | Disposition: A | Discharge status: Home / Self Care | Payer: 59 | Attending: Emergency Medicine | Admitting: Emergency Medicine

## 2012-04-17 ENCOUNTER — Encounter (HOSPITAL_COMMUNITY): Payer: Self-pay | Admitting: Emergency Medicine

## 2012-04-17 DIAGNOSIS — R51 Headache: Secondary | ICD-10-CM | POA: Insufficient documentation

## 2012-04-17 DIAGNOSIS — J029 Acute pharyngitis, unspecified: Secondary | ICD-10-CM | POA: Insufficient documentation

## 2012-04-17 DIAGNOSIS — H9209 Otalgia, unspecified ear: Secondary | ICD-10-CM | POA: Insufficient documentation

## 2012-04-17 DIAGNOSIS — Z79899 Other long term (current) drug therapy: Secondary | ICD-10-CM | POA: Insufficient documentation

## 2012-04-17 DIAGNOSIS — E860 Dehydration: Secondary | ICD-10-CM | POA: Insufficient documentation

## 2012-04-17 DIAGNOSIS — E7800 Pure hypercholesterolemia, unspecified: Secondary | ICD-10-CM | POA: Insufficient documentation

## 2012-04-17 DIAGNOSIS — M542 Cervicalgia: Secondary | ICD-10-CM | POA: Insufficient documentation

## 2012-04-17 DIAGNOSIS — E119 Type 2 diabetes mellitus without complications: Secondary | ICD-10-CM | POA: Insufficient documentation

## 2012-04-17 DIAGNOSIS — R739 Hyperglycemia, unspecified: Secondary | ICD-10-CM

## 2012-04-17 DIAGNOSIS — R0602 Shortness of breath: Secondary | ICD-10-CM | POA: Insufficient documentation

## 2012-04-17 DIAGNOSIS — R000 Tachycardia, unspecified: Secondary | ICD-10-CM | POA: Insufficient documentation

## 2012-04-17 NOTE — ED Notes (Signed)
Pt w/ multiple complaints r/t head, ear, neck and throat. Seen by PCP and placed on Zithromax on Tueslday

## 2012-04-18 ENCOUNTER — Emergency Department (HOSPITAL_COMMUNITY): Payer: 59

## 2012-04-18 LAB — URINALYSIS, ROUTINE W REFLEX MICROSCOPIC
Ketones, ur: NEGATIVE mg/dL
Leukocytes, UA: NEGATIVE
Nitrite: NEGATIVE
Protein, ur: >300 mg/dL — AB
Urobilinogen, UA: 0.2 mg/dL (ref 0.0–1.0)

## 2012-04-18 LAB — CBC WITH DIFFERENTIAL/PLATELET
Basophils Absolute: 0 10*3/uL (ref 0.0–0.1)
Basophils Relative: 1 % (ref 0–1)
Eosinophils Absolute: 0 10*3/uL (ref 0.0–0.7)
Eosinophils Relative: 1 % (ref 0–5)
MCH: 30 pg (ref 26.0–34.0)
MCV: 83.4 fL (ref 78.0–100.0)
Neutrophils Relative %: 60 % (ref 43–77)
Platelets: 356 10*3/uL (ref 150–400)
RDW: 12.8 % (ref 11.5–15.5)

## 2012-04-18 LAB — POCT I-STAT, CHEM 8
BUN: 9 mg/dL (ref 6–23)
Calcium, Ion: 1.17 mmol/L (ref 1.12–1.23)
Hemoglobin: 15.6 g/dL — ABNORMAL HIGH (ref 12.0–15.0)
TCO2: 23 mmol/L (ref 0–100)

## 2012-04-18 LAB — URINE MICROSCOPIC-ADD ON

## 2012-04-18 MED ORDER — ONDANSETRON HCL 4 MG/2ML IJ SOLN
4.0000 mg | INTRAMUSCULAR | Status: DC | PRN
  Administered 2012-04-18: 4 mg via INTRAVENOUS
  Filled 2012-04-18: qty 2

## 2012-04-18 MED ORDER — METOCLOPRAMIDE HCL 5 MG/ML IJ SOLN
10.0000 mg | Freq: Once | INTRAMUSCULAR | Status: AC
  Administered 2012-04-18: 10 mg via INTRAVENOUS
  Filled 2012-04-18: qty 2

## 2012-04-18 MED ORDER — IOHEXOL 350 MG/ML SOLN
100.0000 mL | Freq: Once | INTRAVENOUS | Status: AC | PRN
  Administered 2012-04-18: 100 mL via INTRAVENOUS

## 2012-04-18 MED ORDER — MORPHINE SULFATE 4 MG/ML IJ SOLN
4.0000 mg | INTRAMUSCULAR | Status: DC | PRN
  Administered 2012-04-18: 4 mg via INTRAVENOUS
  Filled 2012-04-18: qty 1

## 2012-04-18 MED ORDER — IBUPROFEN 800 MG PO TABS
800.0000 mg | ORAL_TABLET | Freq: Once | ORAL | Status: AC
  Administered 2012-04-18: 800 mg via ORAL
  Filled 2012-04-18: qty 1

## 2012-04-18 MED ORDER — SODIUM CHLORIDE 0.9 % IV BOLUS (SEPSIS)
1000.0000 mL | Freq: Once | INTRAVENOUS | Status: AC
  Administered 2012-04-18: 1000 mL via INTRAVENOUS

## 2012-04-18 NOTE — ED Provider Notes (Signed)
Medical screening examination/treatment/procedure(s) were performed by non-physician practitioner and as supervising physician I was immediately available for consultation/collaboration.  Holly Iannaccone, MD 04/18/12 2310 

## 2012-04-18 NOTE — Discharge Instructions (Signed)
Continue taking your antibiotics as prescribed by your doctor. Read instructions below to learn more about your diagnosis and for reasons to return to there ER.

## 2012-04-18 NOTE — ED Provider Notes (Signed)
History     CSN: [id number]  Arrival date & time 04/17/12  2130   First MD Initiated Contact with Patient 04/18/12 0005      Chief Complaint  Patient presents with  . Headache    Started on Zithromax on Tuesday, for inflammed lymph left side of neck    (Consider location/radiation/quality/duration/timing/severity/associated sxs/prior treatment) HPI Comments: Patient is a 36 year old female with a history of type 2 diabetes hyperlipidemia and cholecystectomy that presents emergency department with a chief complaint of left neck pain.  Patient states the symptoms have been occurring over a year or but they come and go with swollen lymph nodes however this recent time it is much more severe.  Associated symptoms include left ear pain, sore throat, SOB and headache.  Patient states that she was placed on Zithromax by her primary care physician for an ear infection however this has not improved.  Patient denies fevers, night sweats, chills, cough, hemoptysis, chest pain, leg swelling, claudication, abdominal pain, change in bowel movements or appetite, recent travel, or HRT.  Patient is a 36 y.o. female presenting with headaches. The history is provided by the patient.  Headache  Pertinent negatives include no fever and no shortness of breath.    Past Medical History  Diagnosis Date  . Diabetes mellitus type II   . High cholesterol   . Lower leg, muscle or tendon injury   . 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 Cesarean section     3   . Cholecystectomy     Family History  Problem Relation Age of Onset  . Adopted: Yes  . Autism Son   . ADD / ADHD Son   . Apraxia Son     History  Substance Use Topics  . Smoking status: Former Smoker    Quit date: 07/25/1999  . Smokeless tobacco: Never Used  . Alcohol Use: Yes     occasional use at holidays and with family get togethers- every couple of  months    OB History    Grav Para Term Preterm Abortions TAB SAB Ect Mult Living                  Review of Systems  Constitutional: Negative for fever, chills and appetite change.  HENT: Positive for ear pain, sore throat and neck pain. Negative for congestion and neck stiffness.   Eyes: Negative for visual disturbance.  Respiratory: Negative for shortness of breath.   Cardiovascular: Negative for chest pain and leg swelling.  Gastrointestinal: Negative for abdominal pain.  Genitourinary: Negative for dysuria, urgency and frequency.  Neurological: Positive for headaches. Negative for dizziness, syncope, weakness, light-headedness and numbness.  Psychiatric/Behavioral: Negative for confusion.  All other systems reviewed and are negative.    Allergies  Penicillins  Home Medications   Current Outpatient Rx  Name Route Sig Dispense Refill  . AZITHROMYCIN 500 MG PO TABS Oral Take 500 mg by mouth daily. For 5 days   Stop date:04/20/12    . GABAPENTIN 300 MG PO CAPS Oral Take 300 mg by mouth 3 (three) times daily.     Marland Kitchen GLIPIZIDE ER 2.5 MG PO TB24 Oral Take 5 mg by mouth 2 (two) times daily.    Marland Kitchen HYDROXYZINE HCL 50 MG PO TABS Oral Take 1 tablet (50 mg total) by mouth at bedtime. 30 tablet 1  . INSULIN GLARGINE 100  UNIT/ML [place] SOLN Subcutaneous Inject 70 Units into the skin 2 (two) times daily.    Marland Kitchen METFORMIN HCL 1000 MG PO TABS Oral Take 1,000 mg by mouth 2 (two) times daily with a meal.    . MIRTAZAPINE 30 MG PO TABS Oral Take 1 tablet (30 mg total) by mouth at bedtime. 30 tablet 0  . OMEGA-3-ACID ETHYL ESTERS 1 G PO CAPS Oral Take 2 g by mouth 2 (two) times daily.    . QUINAPRIL HCL 5 MG PO TABS Oral Take 5 mg by mouth at bedtime.    Marland Kitchen ROSUVASTATIN CALCIUM 40 MG PO TABS Oral Take 40 mg by mouth daily.    . SERTRALINE HCL 50 MG PO TABS Oral Take 50 mg by mouth daily.      BP 118/77  Pulse 97  Temp 99.3 F (37.4 C) (Rectal)  Resp 17  SpO2 92%  LMP 04/10/2012  Physical Exam    Nursing note and vitals reviewed. Constitutional: She is oriented to person, place, and time. She appears well-developed and well-nourished. She appears distressed.  HENT:  Head: Normocephalic and atraumatic. No trismus in the jaw.  Right Ear: Tympanic membrane, external ear and ear canal normal.  Left Ear: Tympanic membrane, external ear and ear canal normal.  Nose: Nose normal. No rhinorrhea. Right sinus exhibits no maxillary sinus tenderness and no frontal sinus tenderness. Left sinus exhibits no maxillary sinus tenderness and no frontal sinus tenderness.  Mouth/Throat: Uvula is midline and mucous membranes are normal. Normal dentition. No dental abscesses or uvula swelling. No oropharyngeal exudate, posterior oropharyngeal edema, posterior oropharyngeal erythema or tonsillar abscesses.       No submental edema, tongue not elevated, no trismus. No impending airway obstruction; Pt able to speak full sentences, swallow intact, no drooling, stridor, or tonsillar/uvula displacement. No palatal petechia  Eyes: Conjunctivae are normal.  Neck: Trachea normal, normal range of motion and full passive range of motion without pain. Neck supple. No rigidity. Normal range of motion present. No Brudzinski's sign noted.       Flexion and extension of neck without pain or difficulty. Able to breath without difficulty in extension.  Cervical lymphadenopathy left side.  Cardiovascular: Regular rhythm, normal heart sounds and intact distal pulses.        Tachycardic  Pulmonary/Chest: Effort normal and breath sounds normal. No stridor. No respiratory distress. She has no wheezes.  Abdominal: Soft. There is no tenderness.       No obvious evidence of splenomegaly. Non ttp.   Musculoskeletal: Normal range of motion.  Lymphadenopathy:       Head (right side): No preauricular and no posterior auricular adenopathy present.       Head (left side): No preauricular and no posterior auricular adenopathy present.    She  has cervical adenopathy.  Neurological: She is alert and oriented to person, place, and time.  Skin: Skin is warm and dry. No rash noted. She is not diaphoretic.  Psychiatric: She has a normal mood and affect.    ED Course  Procedures (including critical care time)  Labs Reviewed  CBC WITH DIFFERENTIAL - Abnormal; Notable for the following:    Hemoglobin 15.2 (*)     All other components within normal limits  URINALYSIS, ROUTINE W REFLEX MICROSCOPIC - Abnormal; Notable for the following:    APPearance CLOUDY (*)     Specific Gravity, Urine >1.046 (*)     Glucose, UA >1000 (*)     Hgb urine dipstick SMALL (*)  Protein, ur >300 (*)     All other components within normal limits  POCT I-STAT, CHEM 8 - Abnormal; Notable for the following:    Sodium 132 (*)     Glucose, Bld 401 (*)     Hemoglobin 15.6 (*)     All other components within normal limits  URINE MICROSCOPIC-ADD ON - Abnormal; Notable for the following:    Squamous Epithelial / LPF MANY (*)     All other components within normal limits  RAPID STREP SCREEN   Ct Angio Chest Pe W/cm &/or Wo Cm  04/18/2012  *RADIOLOGY REPORT*  Clinical Data: Tachycardia and shortness of breath.  CT ANGIOGRAPHY CHEST  Technique:  Multidetector CT imaging of the chest using the standard protocol during bolus administration of intravenous contrast. Multiplanar reconstructed images including MIPs were obtained and reviewed to evaluate the vascular anatomy.  Contrast: OMNIPAQUE IOHEXOL 350 MG/ML SOLN  Comparison: None.  Findings: Technically adequate study with moderately good opacification of the central and proximal segmental pulmonary arteries.  Distal peripheral vessels are not well opacified.  No central filling defects suggesting no evidence of significant pulmonary embolus.  Normal caliber thoracic aorta without dissection.  Normal heart size.  Visualized portions of the upper abdominal organs are grossly unremarkable.  The esophagus is  mostly decompressed.  No significant lymphadenopathy in the chest. Dependent atelectasis in the lung bases.  Respiratory motion artifact limits visualization of the lung fields but there is no evidence of significant airspace consolidation or interstitial change.  No pneumothorax.  No pleural effusions.  IMPRESSION: No significant pulmonary embolus identified.  Original Report Authenticated By: Marlon Pel, M.D.     No diagnosis found.    MDM  HA, Ear ache, sore throat , dehydration, hyperglycemia  Pt ambulating throughout hospital in no acute distress. At this time there does not appear to be any evidence of an acute emergency medical condition and the patient appears stable for discharge with appropriate outpatient follow up.Diagnosis was discussed with patient who verbalizes understanding and is agreeable to discharge.          Jaci Carrel, [place] 04/18/12 [phone]

## 2012-05-13 ENCOUNTER — Ambulatory Visit (INDEPENDENT_AMBULATORY_CARE_PROVIDER_SITE_OTHER): Payer: 59 | Admitting: Physician Assistant

## 2012-05-13 DIAGNOSIS — F411 Generalized anxiety disorder: Secondary | ICD-10-CM

## 2012-05-13 DIAGNOSIS — F330 Major depressive disorder, recurrent, mild: Secondary | ICD-10-CM

## 2012-05-13 DIAGNOSIS — F331 Major depressive disorder, recurrent, moderate: Secondary | ICD-10-CM

## 2012-05-13 MED ORDER — SERTRALINE HCL 50 MG PO TABS: 50.0000 mg | ORAL_TABLET | Freq: Every day | ORAL | Status: DC

## 2012-05-13 MED ORDER — MIRTAZAPINE 30 MG PO TABS: 30.0000 mg | ORAL_TABLET | Freq: Every day | ORAL | Status: DC

## 2012-05-13 MED ORDER — HYDROXYZINE PAMOATE 100 MG PO CAPS: 100.0000 mg | ORAL_CAPSULE | Freq: Every day | ORAL | Status: DC

## 2012-05-13 MED ORDER — BUPROPION HCL ER (XL) 150 MG PO TB24: 150.0000 mg | ORAL_TABLET | Freq: Every day | ORAL | Status: DC

## 2012-05-13 NOTE — Progress Notes (Signed)
   [facility] Follow-up Outpatient Visit  [name] 1975/12/20  Date: 05/13/2012   Subjective: [name] presents today to followup on [name] treatment for anxiety and depression. She reports that the Vistaril she was started on that [name] last visit does not help [name] to fall asleep any faster, but she feels she sleeps better. She states that she takes the Vistaril around 8 PM, then goes to bed around 10:30 or 11, but does not fall sleep until 2 AM. She reports that [name] mood is "okay." She states that [name] appetite is poor. She denies any suicidal or homicidal ideation. She denies any auditory or visual hallucinations. She has joined the gym and is trying to lose weight.  There were no vitals filed for this visit.  Mental Status Examination  Appearance: Casual Alert: Yes Attention: good  Cooperative: Yes Eye Contact: Good Speech: Clear and coherent Psychomotor Activity: Normal Memory/Concentration: Intact Oriented: person, place, time/date and situation Mood: Euthymic Affect: Constricted Thought Processes and Associations: Logical Fund of Knowledge: Fair Thought Content: Normal Insight: Fair Judgement: Good  Diagnosis: Generalized anxiety disorder, major present disorder recurrent mild  Treatment Plan: We'll increase [name] Vistaril to 100 mg at bedtime, and continue [name] Wellbutrin XL 150 mg daily, Zoloft 50 mg daily, and Remeron 30 mg at bedtime. She will return for followup in 2 months.  Aaliyana Fredericks, PA-C

## 2012-05-14 ENCOUNTER — Telehealth (HOSPITAL_COMMUNITY): Payer: Self-pay

## 2012-05-14 NOTE — Telephone Encounter (Signed)
11:44am 05/14/12 s/w pt in reference to rx script for Wellbutrin XL, Zoloft, and Remeron - pt states that she will come by the office and pick-up./sh

## 2012-05-19 ENCOUNTER — Other Ambulatory Visit (HOSPITAL_COMMUNITY): Payer: Self-pay | Admitting: *Deleted

## 2012-05-19 DIAGNOSIS — F411 Generalized anxiety disorder: Secondary | ICD-10-CM

## 2012-05-19 MED ORDER — HYDROXYZINE HCL 50 MG PO TABS: 100.0000 mg | ORAL_TABLET | Freq: Every day | ORAL | Status: AC

## 2012-05-19 NOTE — Telephone Encounter (Signed)
Received faxed request 05/16/12 1246 for clarification. Pharmacy states Hydroxyzine Pamoate 100 mg not available, want to substitute Hydroxyzine Pamoate 50mg  2 capsules or Hydroxyzine HCL 100mg .  Substitution to Hydroxyzine HCL 100mg  authorized by Stevphen Meuse

## 2012-06-23 ENCOUNTER — Encounter (HOSPITAL_COMMUNITY): Payer: Self-pay | Admitting: Psychology

## 2012-06-23 DIAGNOSIS — F411 Generalized anxiety disorder: Secondary | ICD-10-CM

## 2012-06-23 DIAGNOSIS — F330 Major depressive disorder, recurrent, mild: Secondary | ICD-10-CM

## 2012-06-23 NOTE — Progress Notes (Signed)
Outpatient Therapist Discharge Summary  [name]    12-Jun-1976   Admission Date: 06/27/10   Discharge Date:  06/23/12 Reason for Discharge:  Not active in counseling Diagnosis: 1. Generalized anxiety disorder   2. Major depressive disorder, recurrent episode, mild     Comments:  Pt last seen in counseling on 11/13/11.  Forde Radon

## 2012-07-02 MED ORDER — METFORMIN HCL 1000 MG PO TABS
1000 MG | ORAL_TABLET | Freq: Two times a day (BID) | ORAL | Status: DC
Start: 2012-07-02 — End: 2012-09-30

## 2012-07-02 NOTE — Patient Instructions (Addendum)
Preventive plan of care for [name] [name]        07/02/2012           Preventive Measures Status       Recommendations for screening           Cervical Cancer Screen   PAP smear: 07/2011 Repeat yearly   Osteoporosis Screen   DEXA scan  This test is not clinically indicated   Diabetes Screen  No results found for this basename: glucose    Repeat yearly   Cholesterol Screen  No results found for this basename: chol, trig, hdl, ldlcalc, ldldirect    Repeat yearly   Aspirin for Cardiovascular Prevention   No Not indicated   Weight: Body mass index is 39.69 kg/(m^2).  5\' 8"  (1.727 m)261 lb (118.389 kg)    Your BMI is 25 or greater, which indicates that you are overweight   Living Will: No   Patient declined    Recommended Immunizations      There is no immunization history on file for this patient.     Influenza vaccine:  recommended every fall         Other Recommendations ??   See a dentist every 6 months  ?? Try to get at least 30 minutes of exercise 3-5 days per week  ?? Always wear a seat belt when traveling in a car  ?? Always wear a helmet when riding a bicycle or motorcycle  ?? When exposed to the sun, use a sunscreen that protects against both UVA and UVB radiation with an SPF of 30 or greater- reapply every 2 to 3 hours or after sweating, drying off with a towel, or swimming  ?? You need 600 mg of calcium and 1000-2000 IU of vitamin D per day- it is possible to meet your calcium requirement with diet alone, but a vitamin D supplement is usually necessary               Follow up in 2 months on POS    Start with metformin 1000 mg 1/2 pill with dinner and increase by 1/2 a pill every 2-4 weeks up to 1 pill twice a day

## 2012-07-02 NOTE — Progress Notes (Signed)
[facility]  History and Physical  Kandy Garrison, M.D.        Lorina Wolfer  Date of Birth:  12-15-1975    Date of Service:  07/02/2012    Chief Complaint:   [name] [name] is a 36 y.o. female who presents for complete physical examination.    HPI: Here to establish.    Polycystic Ovarian Syndrome: diagnose by pelvic ultrasound and labs.  She was placed on BCP 3 years ago but not interested in being on that due to her husband already had a vasectomy.    Wt Readings from Last 3 Encounters:   07/02/12 261 lb (118.389 kg)     BP Readings from Last 3 Encounters:   07/02/12 118/72       Patient Active Problem List   Diagnosis   ??? Polycystic ovarian syndrome       Allergies   Allergen Reactions   ??? Phenergan (Benzyl Alc-Promethazine)      Had it IV and burnt going through vein, caused vomiting      No outpatient prescriptions have been marked as taking for the 07/02/12 encounter (Office Visit) with Randell Patient Tzipporah Nagorski, MD.       History reviewed. No pertinent past medical history.  History reviewed. No pertinent past surgical history.  Family History   Problem Relation Age of Onset   ??? High Blood Pressure Father    ??? Stroke Father    ??? Substance Abuse Father      Alcohol   ??? Cancer Maternal Aunt      Pancreatic   ??? Diabetes Paternal Grandmother    ??? Heart Disease Paternal Grandfather      MI   ??? Stroke Paternal Grandfather      History     Social History   ??? Marital Status: Married     Spouse Name: N/A     Number of Children: N/A   ??? Years of Education: N/A     Occupational History   ??? Not on file.     Social History Main Topics   ??? Smoking status: Never Smoker    ??? Smokeless tobacco: Never Used   ??? Alcohol Use: Yes      socially    ??? Drug Use: No   ??? Sexually Active: Yes -- Female partner(s)     Other Topics Concern   ??? Not on file     Social History Narrative   ??? No narrative on file       Review of Systems:  A comprehensive review of systems was negative except for what was noted in the HPI.     Physical Exam:   Filed  Vitals:    07/02/12 1331   BP: 118/72   Pulse: 84   Resp: 16   Height: 5\' 8"  (1.727 m)   Weight: 261 lb (118.389 kg)     Body mass index is 39.69 kg/(m^2).   Constitutional: She is oriented to person, place, and time. She appears well-developed and well-nourished. No distress.   HEENT:   Head: Normocephalic and atraumatic.   Right Ear: Tympanic membrane, external ear and ear canal normal.   Left Ear: Tympanic membrane, external ear and ear canal normal.   Mouth/Throat: Oropharynx is clear and moist, and mucous membranes are normal.  There is no cervical adenopathy.  Eyes: Conjunctivae and extraocular motions are normal. Pupils are equal, round, and reactive to light.   Neck: Supple. No JVD present. Carotid bruit is  not present. No mass and no thyromegaly present.   Cardiovascular: Normal rate, regular rhythm, normal heart sounds and intact distal pulses.  Exam reveals no gallop and no friction rub.  No murmur heard.  Pulmonary/Chest: Effort normal and breath sounds normal. No respiratory distress. She has no wheezes, rhonchi or rales.   Abdominal: Soft, non-tender. Bowel sounds and aorta are normal. She exhibits no organomegaly, mass or bruit.   Genitourinary: performed by gynecologist.  Breast exam:  performed by specialist.  Musculoskeletal: Normal range of motion, no synovitis. She exhibits no edema.   Neurological: She is alert and oriented to person, place, and time. She has normal reflexes. No cranial nerve deficit. Coordination normal.   Skin: Skin is warm and dry. There is no rash or erythema.  No suspicious lesions noted.   Psychiatric: She has a normal mood and affect. Her speech is normal and behavior is normal. Judgment, cognition and memory are normal.     Preventive Care:  Health Maintenance   Topic Date Due   ??? Flu Vaccine Yearly (Adult)  05/11/2013   ??? Cervical Cancer Screening  07/11/2014   ??? Tetanus Vaccine Adult (11 Years And Up)  07/02/2022      Hx abnormal PAP: no  Sexual activity: has sex with  males   Self-breast exams: yes  Last eye exam: 2011, glasses prn  Exercise: walks 2 time(s) per week  Seatbelt use: yes       Preventive plan of care for Dealie Golz        07/02/2012           Preventive Measures Status       Recommendations for screening           Cervical Cancer Screen   PAP smear: 07/2011 Repeat yearly   Osteoporosis Screen   DEXA scan  This test is not clinically indicated   Diabetes Screen  No results found for this basename: glucose    Repeat yearly   Cholesterol Screen  No results found for this basename: chol, trig, hdl, ldlcalc, ldldirect    Repeat yearly   Aspirin for Cardiovascular Prevention   No Not indicated   Weight: Body mass index is 39.69 kg/(m^2).  5\' 8"  (1.727 m)261 lb (118.389 kg)    Your BMI is 25 or greater, which indicates that you are overweight   Living Will: No   Patient declined    Recommended Immunizations      There is no immunization history on file for this patient.     Influenza vaccine:  recommended every fall         Other Recommendations ??   See a dentist every 6 months  ?? Try to get at least 30 minutes of exercise 3-5 days per week  ?? Always wear a seat belt when traveling in a car  ?? Always wear a helmet when riding a bicycle or motorcycle  ?? When exposed to the sun, use a sunscreen that protects against both UVA and UVB radiation with an SPF of 30 or greater- reapply every 2 to 3 hours or after sweating, drying off with a towel, or swimming  ?? You need 600 mg of calcium and 1000-2000 IU of vitamin D per day- it is possible to meet your calcium requirement with diet alone, but a vitamin D supplement is usually necessary                 Assessment/Plan:    Jamilette was seen  today for established new doctor.    Diagnoses and associated orders for this visit:    Annual physical exam  - Lipid panel; Future  - Comprehensive metabolic panel; Future  - CBC Auto Differential; Future  - TSH; Future  - T4, free; Future    Polycystic ovarian syndrome  - metFORMIN (GLUCOPHAGE)  1000 MG tablet; Take 1 tablet by mouth 2 times daily (with meals).    Start with metformin 1000 mg 1/2 pill with dinner and increase by 1/2 a pill every 2-4 weeks up to 1 pill twice a day    Return for 2 months on POS.

## 2012-07-15 ENCOUNTER — Ambulatory Visit (HOSPITAL_COMMUNITY): Payer: Self-pay | Admitting: Physician Assistant

## 2012-07-31 ENCOUNTER — Ambulatory Visit (INDEPENDENT_AMBULATORY_CARE_PROVIDER_SITE_OTHER): Payer: No Typology Code available for payment source | Admitting: Family Medicine

## 2012-07-31 ENCOUNTER — Encounter: Payer: Self-pay | Admitting: Family Medicine

## 2012-07-31 VITALS — BP 100/70 | HR 119 | Temp 98.2°F

## 2012-07-31 DIAGNOSIS — E1169 Type 2 diabetes mellitus with other specified complication: Secondary | ICD-10-CM | POA: Insufficient documentation

## 2012-07-31 DIAGNOSIS — E1149 Type 2 diabetes mellitus with other diabetic neurological complication: Secondary | ICD-10-CM

## 2012-07-31 DIAGNOSIS — E249 Cushing's syndrome, unspecified: Secondary | ICD-10-CM

## 2012-07-31 DIAGNOSIS — E1142 Type 2 diabetes mellitus with diabetic polyneuropathy: Secondary | ICD-10-CM

## 2012-07-31 DIAGNOSIS — E240 Pituitary-dependent Cushing's disease: Secondary | ICD-10-CM

## 2012-07-31 DIAGNOSIS — M25519 Pain in unspecified shoulder: Secondary | ICD-10-CM

## 2012-07-31 DIAGNOSIS — F330 Major depressive disorder, recurrent, mild: Secondary | ICD-10-CM

## 2012-07-31 DIAGNOSIS — E785 Hyperlipidemia, unspecified: Secondary | ICD-10-CM

## 2012-07-31 DIAGNOSIS — E039 Hypothyroidism, unspecified: Secondary | ICD-10-CM

## 2012-07-31 MED ORDER — MELOXICAM 15 MG PO TABS: 15.0000 mg | ORAL_TABLET | Freq: Every day | ORAL | Status: DC

## 2012-07-31 NOTE — Patient Instructions (Addendum)
We'll get the records from Dr Brayton Layman and work out who's taking care of what Start the [employer] daily for the shoulder pain We'll call you with your ortho appt Call with any questions or concerns Hang in there!

## 2012-07-31 NOTE — Progress Notes (Signed)
  Subjective:    Patient ID: [name], female    DOB: 04/05/1976, 36 y.o.   MRN: [id number]  HPI New to establish.  Previous MD- [place], since then has been floating around, [place] most recently.  Dr Chesley Mires- [place], managing diabetes  DM- chronic problem, Type II.  On Glipizide, Lantus, Metformin.  Following w/ DR Chesley Mires.  On ACE for renal protection.  Hyperlipidemia- chronic problem, on Gemfibrozil, Lovaza, Crestor, Niacin.  Started 1 month ago.  Had recent labs.  No abd pain, N/V, myalgias.  Diabetic neuropathy- sxs started 1-2 yrs.  Started on Gabapentin 4 weeks ago.  sxs are improving.  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  Had to stop counseling due to cost.  On Amitriptyline, Wellbutrin, Zoloft.  Pt feels sxs are currently well controlled.  Has hx of severe depression.  Has not required hospitalization.  Hypothyroid- chronic problem, on Synthroid.  Cushing's- dx'd by Dr Chesley Mires 2 months ago.  L shoulder pain- sxs occur intermittently but are becoming more frequent.  Pain and numbness will radiation into L arm.  Will have occasional weakness of L arm.   Review of Systems For ROS see HPI     Objective:   Physical Exam  Vitals reviewed. Constitutional: She is oriented to person, place, and time. She appears well-developed and well-nourished. No distress.       Obese, appears older than stated age  HENT:  Head: Normocephalic and atraumatic.  Eyes: Conjunctivae normal and EOM are normal. Pupils are equal, round, and reactive to light.  Neck: Normal range of motion. Neck supple.       + buffalo hump  Cardiovascular: Normal rate, regular rhythm, normal heart sounds and intact distal pulses.   No murmur heard. Pulmonary/Chest: Effort normal and breath sounds normal. No respiratory distress.  Abdominal: Soft. She exhibits no distension. There is no tenderness.       obese  Musculoskeletal: She exhibits no edema.       Left shoulder:  She exhibits decreased range of motion, tenderness and pain. She exhibits normal pulse and normal strength.  Lymphadenopathy:    She has no cervical adenopathy.  Neurological: She is alert and oriented to person, place, and time.  Skin: Skin is warm and dry.  Psychiatric: She has a normal mood and affect. Her behavior is normal.          Assessment & Plan:

## 2012-08-03 NOTE — Assessment & Plan Note (Signed)
New.  This appears to be only issue that I can help pt w/.  Will start daily NSAID and refer to ortho given severely limited motion and degree of pain.

## 2012-08-03 NOTE — Assessment & Plan Note (Signed)
New to provider.  Pt reports Dr Chesley Mires has assumed management.  Has recently adjusted meds and started her on gabapentin.  Will follow along and assist as able.

## 2012-08-03 NOTE — Assessment & Plan Note (Signed)
New to provider.  Pt indicates recent labs.  Will attempt to get records in order to follow along.

## 2012-08-03 NOTE — Assessment & Plan Note (Signed)
New to provider, chronic for pt.  Was following w/ [facility] but reports she can no longer afford this.  Will follow along and assist as able.  Pt feels sxs are currently stable on meds.

## 2012-08-03 NOTE — Assessment & Plan Note (Signed)
New to provider, following w/ Dr Chesley Mires in [place].  Pt indicated that she would like Dr Chesley Mires to manage all of her regular health issues but would like to have PCP locally for acute issues.

## 2012-08-03 NOTE — Assessment & Plan Note (Signed)
New to provider, chronic for pt.  Following w/ Dr Chesley Mires.  Reports recent labs.  Will attempt to get records and follow along.

## 2012-08-12 ENCOUNTER — Other Ambulatory Visit (HOSPITAL_COMMUNITY): Payer: Self-pay | Admitting: *Deleted

## 2012-08-12 MED ORDER — SERTRALINE HCL 50 MG PO TABS: 50.0000 mg | ORAL_TABLET | Freq: Every day | ORAL | Status: DC

## 2012-08-12 NOTE — Telephone Encounter (Signed)
Received faxed refill request for Zoloft from [facility].[Must fax [phone] or call [phone] refill]

## 2012-08-13 ENCOUNTER — Telehealth: Payer: Self-pay | Admitting: Family Medicine

## 2012-08-13 NOTE — Telephone Encounter (Signed)
Patient Information:  Caller Name: Loraine Leriche  Phone: [phone]  Patient: [name], [name]  Gender: Female  DOB: 06/25/1976  Age: 36 Years  PCP: Sheliah Hatch  Pregnant: No   Symptoms  Reason For Call & Symptoms: Husband calling to see if there is a stronger medication that she can take for the pain in her neck that radiates into her left arm.  Reviewed Health History In EMR: Yes  Reviewed Medications In EMR: Yes  Reviewed Allergies In EMR: Yes  Reviewed Surgeries / Procedures: Yes  Date of Onset of Symptoms: 07/31/2012 OB:  LMP: 06/10/2012  Guideline(s) Used:  No Protocol Available - Information Only  Disposition Per Guideline:   Home Care  Reason For Disposition Reached:   Information only question and nurse able to answer  Advice Given:  N/A  Office Follow Up:  Does the office need to follow up with this patient?: Yes  Instructions For The Office: patient continues to have the pain and numbness and the Meloxicam is not helping.  Her ortho appts is not until 12/19.  RN Note:  Husband first said that she was with him, sitting beside him and then when I asked him to confirm her LMP he said that she wasn't with him.  Unable to triage.

## 2012-08-14 MED ORDER — HYDROCODONE-ACETAMINOPHEN 5-500 MG PO TABS: 1.0000 | ORAL_TABLET | Freq: Four times a day (QID) | ORAL | Status: DC | PRN

## 2012-08-14 NOTE — Telephone Encounter (Signed)
Patient called in with neck pain-  Instructions For The Office: patient continues to have the pain and numbness and the Meloxicam is not helping. Her ortho appts is not until 12/19. Please advise     KP

## 2012-08-14 NOTE — Telephone Encounter (Signed)
PCP out to the office, for now will prescribe Vicodin 5 500 one tablet every 6 hours, no more than 4 tablets daily, #20, no refills. Please explained the patient that this is a temporary solution, I don't think PCP will prescribe pain medication on ongoing basis. Be aware pain medication will cause drowsiness. If pain is worse, needs to go to urgent care or call us to try to get a sooner orthopedic referral.

## 2012-08-14 NOTE — Telephone Encounter (Signed)
Discussed with patient's husband and he voiced understanding of Dr.Paz's recommendations. He agreed to follow up with Dr.Tabori prn and will try to get a sooner apt with Ortho.        KP

## 2012-08-14 NOTE — Telephone Encounter (Signed)
I called the husband's cell and the home number and left messages on both lines         KP

## 2012-08-26 ENCOUNTER — Encounter (HOSPITAL_COMMUNITY): Payer: Self-pay | Admitting: *Deleted

## 2012-08-26 DIAGNOSIS — Q899 Congenital malformation, unspecified: Secondary | ICD-10-CM | POA: Insufficient documentation

## 2012-08-26 DIAGNOSIS — E249 Cushing's syndrome, unspecified: Secondary | ICD-10-CM | POA: Insufficient documentation

## 2012-08-26 DIAGNOSIS — E1169 Type 2 diabetes mellitus with other specified complication: Secondary | ICD-10-CM | POA: Insufficient documentation

## 2012-08-26 DIAGNOSIS — Z87891 Personal history of nicotine dependence: Secondary | ICD-10-CM | POA: Insufficient documentation

## 2012-08-26 DIAGNOSIS — Z79899 Other long term (current) drug therapy: Secondary | ICD-10-CM | POA: Insufficient documentation

## 2012-08-26 DIAGNOSIS — Z87828 Personal history of other (healed) physical injury and trauma: Secondary | ICD-10-CM | POA: Insufficient documentation

## 2012-08-26 DIAGNOSIS — Z3202 Encounter for pregnancy test, result negative: Secondary | ICD-10-CM | POA: Insufficient documentation

## 2012-08-26 DIAGNOSIS — F1210 Cannabis abuse, uncomplicated: Secondary | ICD-10-CM | POA: Insufficient documentation

## 2012-08-26 DIAGNOSIS — E7800 Pure hypercholesterolemia, unspecified: Secondary | ICD-10-CM | POA: Insufficient documentation

## 2012-08-26 DIAGNOSIS — Z794 Long term (current) use of insulin: Secondary | ICD-10-CM | POA: Insufficient documentation

## 2012-08-26 DIAGNOSIS — Z7982 Long term (current) use of aspirin: Secondary | ICD-10-CM | POA: Insufficient documentation

## 2012-08-26 LAB — CBC WITH DIFFERENTIAL/PLATELET
Basophils Absolute: 0 10*3/uL (ref 0.0–0.1)
Basophils Relative: 0 % (ref 0–1)
Eosinophils Absolute: 0 10*3/uL (ref 0.0–0.7)
Eosinophils Relative: 0 % (ref 0–5)
HCT: 45.4 % (ref 36.0–46.0)
Hemoglobin: 15.9 g/dL — ABNORMAL HIGH (ref 12.0–15.0)
MCH: 29.4 pg (ref 26.0–34.0)
MCHC: 35 g/dL (ref 30.0–36.0)
Monocytes Absolute: 0.4 10*3/uL (ref 0.1–1.0)
Monocytes Relative: 6 % (ref 3–12)
RDW: 13.1 % (ref 11.5–15.5)

## 2012-08-26 LAB — URINALYSIS, ROUTINE W REFLEX MICROSCOPIC
Ketones, ur: 15 mg/dL — AB
Leukocytes, UA: NEGATIVE
Nitrite: NEGATIVE
Specific Gravity, Urine: 1.042 — ABNORMAL HIGH (ref 1.005–1.030)
pH: 5.5 (ref 5.0–8.0)

## 2012-08-26 LAB — COMPREHENSIVE METABOLIC PANEL
Albumin: 3 g/dL — ABNORMAL LOW (ref 3.5–5.2)
BUN: 15 mg/dL (ref 6–23)
Calcium: 9.7 mg/dL (ref 8.4–10.5)
Creatinine, Ser: 0.44 mg/dL — ABNORMAL LOW (ref 0.50–1.10)
Total Bilirubin: 0.4 mg/dL (ref 0.3–1.2)
Total Protein: 7.4 g/dL (ref 6.0–8.3)

## 2012-08-26 LAB — URINE MICROSCOPIC-ADD ON

## 2012-08-26 LAB — PREGNANCY, URINE: Preg Test, Ur: NEGATIVE

## 2012-08-26 NOTE — ED Notes (Signed)
The pt was seen by her doctor in [place] earlier today and had high blood sugar.  She was called and told to come here.  She has an inflamned lt ear for several days

## 2012-08-27 ENCOUNTER — Emergency Department (HOSPITAL_COMMUNITY)
Admission: EM | Admit: 2012-08-27 | Discharge: 2012-08-27 | Disposition: A | Discharge status: Home / Self Care | Payer: No Typology Code available for payment source | Attending: Emergency Medicine | Admitting: Emergency Medicine

## 2012-08-27 DIAGNOSIS — R739 Hyperglycemia, unspecified: Secondary | ICD-10-CM

## 2012-08-27 MED ORDER — INSULIN ASPART 100 UNIT/ML ~~LOC~~ SOLN
10.0000 [IU] | Freq: Once | SUBCUTANEOUS | Status: AC
Start: 2012-08-27 — End: 2012-08-27
  Administered 2012-08-27: 10 [IU] via INTRAVENOUS
  Filled 2012-08-27: qty 1

## 2012-08-27 MED ORDER — SODIUM CHLORIDE 0.9 % IV BOLUS (SEPSIS)
1000.0000 mL | Freq: Once | INTRAVENOUS | Status: AC
  Administered 2012-08-27: 1000 mL via INTRAVENOUS

## 2012-08-27 MED ORDER — INSULIN ASPART 100 UNIT/ML ~~LOC~~ SOLN
10.0000 [IU] | Freq: Once | SUBCUTANEOUS | Status: AC
  Administered 2012-08-27: 10 [IU] via SUBCUTANEOUS
  Filled 2012-08-27: qty 1

## 2012-08-27 NOTE — ED Provider Notes (Signed)
History     CSN: [id number]  Arrival date & time 08/26/12  2014   First MD Initiated Contact with Patient 08/27/12 0115      Chief Complaint  Patient presents with  . Hyperglycemia    (Consider location/radiation/quality/duration/timing/severity/associated sxs/prior treatment) HPI History provided by patient and her husband bedside. As a type II diabetic takes multiple medications or her blood sugars in the last 24 hours has noticed hyperglycemia into the 500s. No fevers or chills. No cough, chest pain or shortness of breath. No abdominal pain. No nausea vomiting or diarrhea. Patient does have a lymph node left cervical area that she tells me cells up every time she gets hyperglycemic. She has required antibiotics for this in the past. No ear pain or drainage. No sore throat. No rash. No difficulty chewing or swallowing. Some polyuria and polydipsia today Past Medical History  Diagnosis Date  . Diabetes mellitus type II   . High cholesterol   . Lower leg, muscle or tendon injury   . 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 Cushing disease     Past Surgical History  Procedure Date  . Cesarean section     3   . Cholecystectomy     Family History  Problem Relation Age of Onset  . Adopted: Yes  . Autism Son   . ADD / ADHD Son   . Apraxia Son     History  Substance Use Topics  . Smoking status: Former Smoker    Quit date: 07/25/1999  . Smokeless tobacco: Never Used  . Alcohol Use: Yes     Comment: occasional use at holidays and with family get togethers- every couple of months    OB History    Grav Para Term Preterm Abortions TAB SAB Ect Mult Living                  Review of Systems  Constitutional: Negative for fever and chills.  HENT: Negative for sore throat, mouth sores, trouble swallowing and neck stiffness.   Eyes: Negative for pain.  Respiratory: Negative for shortness of breath.   Cardiovascular: Negative for  chest pain.  Gastrointestinal: Negative for abdominal pain.  Genitourinary: Negative for dysuria.  Musculoskeletal: Negative for back pain.  Skin: Negative for rash.  Neurological: Negative for headaches.  All other systems reviewed and are negative.    Allergies  Penicillins  Home Medications   Current Outpatient Rx  Name  Route  Sig  Dispense  Refill  . AMITRIPTYLINE HCL 50 MG PO TABS   Oral   Take 50 mg by mouth at bedtime.         . ASPIRIN EC 81 MG PO TBEC   Oral   Take 81 mg by mouth daily.         . BUPROPION HCL ER (XL) 150 MG PO TB24   Oral   Take 1 tablet (150 mg total) by mouth daily.   90 tablet   0   . CHOLECALCIFEROL 2000 UNITS PO TABS   Oral   Take 1 tablet by mouth daily.         Marland Kitchen GABAPENTIN 300 MG PO CAPS   Oral   Take 300 mg by mouth 2 (two) times daily.          Marland Kitchen GEMFIBROZIL 600 MG PO TABS   Oral   Take 600 mg by mouth 2 (two) times daily before a  meal.         . GLIPIZIDE ER 2.5 MG PO TB24   Oral   Take 5 mg by mouth 2 (two) times daily.         Marland Kitchen HYDROCODONE-ACETAMINOPHEN 5-500 MG PO TABS   Oral   Take 1 tablet by mouth every 6 (six) hours as needed for pain.   20 tablet   0   . HYDROXYZINE PAMOATE 50 MG PO CAPS   Oral   Take 100 mg by mouth at bedtime as needed. sleep         . INSULIN GLARGINE 100 UNIT/ML [place] SOLN   Subcutaneous   Inject 80 Units into the skin 2 (two) times daily.          Marland Kitchen LEVOTHYROXINE SODIUM 100 MCG PO TABS   Oral   Take 100 mcg by mouth daily.         . MELOXICAM 15 MG PO TABS   Oral   Take 1 tablet (15 mg total) by mouth daily.   30 tablet   6   . METFORMIN HCL ER (MOD) 500 MG PO TB24   Oral   Take 500 mg by mouth 2 (two) times daily with a meal.         . MIRTAZAPINE 30 MG PO TABS   Oral   Take 30 mg by mouth at bedtime.         Marland Kitchen NIACIN 500 MG PO TABS   Oral   Take 500 mg by mouth daily with breakfast.         . OMEGA-3-ACID ETHYL ESTERS 1 G PO CAPS   Oral    Take 2 g by mouth 2 (two) times daily.         . QUINAPRIL HCL 5 MG PO TABS   Oral   Take 5 mg by mouth at bedtime.         . SELENIMIN PO   Oral   Take 1 capsule by mouth daily.         . SERTRALINE HCL 50 MG PO TABS   Oral   Take 1 tablet (50 mg total) by mouth daily.   30 tablet   0     Please flag RX-Needs to make appointment for follo ...   . MIRTAZAPINE 30 MG PO TABS   Oral   Take 1 tablet (30 mg total) by mouth at bedtime.   90 tablet   0     BP 129/73  Pulse 115  Temp 98.1 F (36.7 C) (Oral)  Resp 22  SpO2 97%  Physical Exam  Constitutional: She is oriented to person, place, and time. She appears well-developed and well-nourished.  HENT:  Head: Normocephalic and atraumatic.  Right Ear: External ear normal.  Left Ear: External ear normal.       TMs clear. No posterior are regular tenderness or swelling. No palpable lymphadenopathy. No tenderness over parotid gland. No trismus.  Eyes: Conjunctivae normal and EOM are normal. Pupils are equal, round, and reactive to light.  Neck: Trachea normal. Neck supple. No thyromegaly present.  Cardiovascular: Normal rate, regular rhythm, S1 normal, S2 normal and normal pulses.     No systolic murmur is present   No diastolic murmur is present  Pulses:      Radial pulses are 2+ on the right side, and 2+ on the left side.  Pulmonary/Chest: Effort normal and breath sounds normal. She has no wheezes. She has no rhonchi.  She has no rales. She exhibits no tenderness.  Abdominal: Soft. Normal appearance and bowel sounds are normal. There is no tenderness. There is no CVA tenderness and negative Murphy's sign.  Musculoskeletal:       BLE:s Calves nontender, no cords or erythema, negative Homans sign  Neurological: She is alert and oriented to person, place, and time. She has normal strength. No cranial nerve deficit or sensory deficit. GCS eye subscore is 4. GCS verbal subscore is 5. GCS motor subscore is 6.  Skin: Skin is  warm and dry. No rash noted. She is not diaphoretic.  Psychiatric: Her speech is normal.       Cooperative and appropriate    ED Course  Procedures (including critical care time) Results for orders placed during the hospital encounter of 08/27/12  CBC WITH DIFFERENTIAL      Component Value Range   WBC 7.2  4.0 - 10.5 K/uL   RBC 5.41 (*) 3.87 - 5.11 MIL/uL   Hemoglobin 15.9 (*) 12.0 - 15.0 g/dL   HCT 16.1  09.6 - 04.5 %   MCV 83.9  78.0 - 100.0 fL   MCH 29.4  26.0 - 34.0 pg   MCHC 35.0  30.0 - 36.0 g/dL   RDW 40.9  81.1 - 91.4 %   Platelets 368  150 - 400 K/uL   Neutrophils Relative 59  43 - 77 %   Neutro Abs 4.2  1.7 - 7.7 K/uL   Lymphocytes Relative 35  12 - 46 %   Lymphs Abs 2.5  0.7 - 4.0 K/uL   Monocytes Relative 6  3 - 12 %   Monocytes Absolute 0.4  0.1 - 1.0 K/uL   Eosinophils Relative 0  0 - 5 %   Eosinophils Absolute 0.0  0.0 - 0.7 K/uL   Basophils Relative 0  0 - 1 %   Basophils Absolute 0.0  0.0 - 0.1 K/uL  COMPREHENSIVE METABOLIC PANEL      Component Value Range   Sodium 128 (*) 135 - 145 mEq/L   Potassium 4.2  3.5 - 5.1 mEq/L   Chloride 87 (*) 96 - 112 mEq/L   CO2 23  19 - 32 mEq/L   Glucose, Bld 479 (*) 70 - 99 mg/dL   BUN 15  6 - 23 mg/dL   Creatinine, Ser 7.82 (*) 0.50 - 1.10 mg/dL   Calcium 9.7  8.4 - 95.6 mg/dL   Total Protein 7.4  6.0 - 8.3 g/dL   Albumin 3.0 (*) 3.5 - 5.2 g/dL   AST 25  0 - 37 U/L   ALT 21  0 - 35 U/L   Alkaline Phosphatase 88  39 - 117 U/L   Total Bilirubin 0.4  0.3 - 1.2 mg/dL   GFR calc non Af Amer >90  >90 mL/min   GFR calc Af Amer >90  >90 mL/min  PREGNANCY, URINE      Component Value Range   Preg Test, Ur NEGATIVE  NEGATIVE  URINALYSIS, ROUTINE W REFLEX MICROSCOPIC      Component Value Range   Color, Urine YELLOW  YELLOW   APPearance CLEAR  CLEAR   Specific Gravity, Urine 1.042 (*) 1.005 - 1.030   pH 5.5  5.0 - 8.0   Glucose, UA >1000 (*) NEGATIVE mg/dL   Hgb urine dipstick NEGATIVE  NEGATIVE   Bilirubin Urine  NEGATIVE  NEGATIVE   Ketones, ur 15 (*) NEGATIVE mg/dL   Protein, ur 213 (*) NEGATIVE mg/dL  Urobilinogen, UA 0.2  0.0 - 1.0 mg/dL   Nitrite NEGATIVE  NEGATIVE   Leukocytes, UA NEGATIVE  NEGATIVE  URINE MICROSCOPIC-ADD ON      Component Value Range   Squamous Epithelial / LPF FEW (*) RARE   WBC, UA 0-2  <3 WBC/hpf   Bacteria, UA RARE  RARE   Urine-Other FEW YEAST    GLUCOSE, CAPILLARY      Component Value Range   Glucose-Capillary 368 (*) 70 - 99 mg/dL  GLUCOSE, CAPILLARY      Component Value Range   Glucose-Capillary 290 (*) 70 - 99 mg/dL   IV fluids. Oral fluids. Insulin provided. Serial evaluations. Labs reviewed as above.  6:01 AM blood sugar improving and patient requesting to be discharged home. Plan continue medications and followup with her doctor. She has scheduled followup with ear nose and throat. She has all prescribed medications at home. She is followed by an endocrinologist as well - she will call today for review of medications  MDM   Hyperglycemia in a type II diabetic improved with oral and IV fluids. Labs and urinalysis reviewed as above. vital signs of records reviewed.        Sunnie Nielsen, MD 08/27/12 [phone]

## 2012-08-28 LAB — GLUCOSE, CAPILLARY: Glucose-Capillary: 541 mg/dL — ABNORMAL HIGH (ref 70–99)

## 2012-09-01 ENCOUNTER — Telehealth: Payer: Self-pay | Admitting: Family Medicine

## 2012-09-01 DIAGNOSIS — R52 Pain, unspecified: Secondary | ICD-10-CM

## 2012-09-01 MED ORDER — HYDROCODONE-ACETAMINOPHEN 5-500 MG PO TABS: 1.0000 | ORAL_TABLET | Freq: Four times a day (QID) | ORAL | Status: DC | PRN

## 2012-09-01 NOTE — Telephone Encounter (Signed)
Please advise on RF request.//AB/CMA 

## 2012-09-01 NOTE — Telephone Encounter (Signed)
Patient is requesting Rx for Hydrocodone. She only needs enough to make it to 09/08/12 since she is having a procedure that should alleviate her pain.

## 2012-09-01 NOTE — Telephone Encounter (Signed)
Script printed for hydrocodone #20 with no refills.

## 2012-09-01 NOTE — Telephone Encounter (Signed)
Ok for #20, no refills 

## 2012-09-02 NOTE — Telephone Encounter (Signed)
Rx sent to pharmacy by fax.//AB/CMA 

## 2012-09-11 ENCOUNTER — Ambulatory Visit
Payer: No Typology Code available for payment source | Attending: Physical Medicine and Rehabilitation | Admitting: Physical Therapy

## 2012-09-11 DIAGNOSIS — M25519 Pain in unspecified shoulder: Secondary | ICD-10-CM | POA: Insufficient documentation

## 2012-09-11 DIAGNOSIS — M542 Cervicalgia: Secondary | ICD-10-CM | POA: Insufficient documentation

## 2012-09-11 DIAGNOSIS — IMO0001 Reserved for inherently not codable concepts without codable children: Secondary | ICD-10-CM | POA: Insufficient documentation

## 2012-09-16 ENCOUNTER — Telehealth (HOSPITAL_COMMUNITY): Payer: Self-pay

## 2012-09-16 ENCOUNTER — Ambulatory Visit: Payer: No Typology Code available for payment source | Admitting: Physical Therapy

## 2012-09-16 MED ORDER — SERTRALINE HCL 50 MG PO TABS: 50.0000 mg | ORAL_TABLET | Freq: Every day | ORAL | Status: DC

## 2012-09-16 NOTE — Telephone Encounter (Signed)
Refill for Zoloft sent to [facility] on [address]

## 2012-09-19 ENCOUNTER — Encounter: Payer: Self-pay | Admitting: Physical Therapy

## 2012-09-22 ENCOUNTER — Encounter: Payer: Self-pay | Admitting: Physical Therapy

## 2012-09-24 ENCOUNTER — Other Ambulatory Visit (HOSPITAL_COMMUNITY): Payer: Self-pay | Admitting: *Deleted

## 2012-09-24 DIAGNOSIS — F331 Major depressive disorder, recurrent, moderate: Secondary | ICD-10-CM

## 2012-09-24 MED ORDER — MIRTAZAPINE 30 MG PO TABS: 30.0000 mg | ORAL_TABLET | Freq: Every day | ORAL | Status: DC

## 2012-09-25 ENCOUNTER — Encounter: Payer: Self-pay | Admitting: Physical Therapy

## 2012-09-26 NOTE — Telephone Encounter (Signed)
Received call from patient requesting a refill on her metFORMIN (GLUCOPHAGE) 1000 MG tablet #60. She had to cancel her follow-up appointment due to her job, on 08/25/12,  to let you know how meds were working and then get refills. She will not have any medications left to take on Monday, 09/29/12. She said she is doing very well on them and asked if she could get a refill and to let you know she rescheduled her appointment with you on 10/06/12. Her pharmacy is [facility],[place] [phone]. Thank you.

## 2012-09-30 ENCOUNTER — Encounter

## 2012-09-30 MED ORDER — METFORMIN HCL 1000 MG PO TABS
1000 MG | ORAL_TABLET | Freq: Two times a day (BID) | ORAL | Status: DC
Start: 2012-09-30 — End: 2012-10-06

## 2012-09-30 NOTE — Telephone Encounter (Signed)
Left message.

## 2012-09-30 NOTE — Telephone Encounter (Signed)
Inform pt done

## 2012-10-06 MED ORDER — METFORMIN HCL 1000 MG PO TABS
1000 MG | ORAL_TABLET | Freq: Two times a day (BID) | ORAL | Status: DC
Start: 2012-10-06 — End: 2013-05-07

## 2012-10-06 MED ORDER — SPIRONOLACTONE 25 MG PO TABS
25 MG | ORAL_TABLET | Freq: Every day | ORAL | Status: DC
Start: 2012-10-06 — End: 2012-11-06

## 2012-10-06 NOTE — Patient Instructions (Signed)
Follow up in 1 month on POS

## 2012-10-06 NOTE — Progress Notes (Signed)
Subjective:      Patient ID: [name] [name] is a 37 y.o. female.    HPI  Polycystic Ovarian Syndrome: diagnose by pelvic ultrasound and labs. She was placed on BCP 3 years ago but not interested in being on that due to her husband already had a vasectomy.  No problems with metformin 1g bid.  Not much change in facial hair growth but her period is now regular and she is now able to lose weight.  Hirsutism: unchanged and usually needs to plug it daily.    Review of Systems  was negative except of what was stated on HPI      Objective:   Physical Exam  Nursing note and vitals reviewed.    Filed Vitals:    10/06/12 1413   BP: 126/90   Pulse: 89   Temp: 96.4 ??F (35.8 ??C)   TempSrc: Tympanic   Resp: 18   Height: 5' 8.5" (1.74 m)   Weight: 253 lb 12.8 oz (115.123 kg)     Wt Readings from Last 3 Encounters:   10/06/12 253 lb 12.8 oz (115.123 kg)   07/02/12 261 lb (118.389 kg)     BP Readings from Last 3 Encounters:   10/06/12 126/90   07/02/12 118/72     Body mass index is 38.02 kg/(m^2).  Constitutional: Patient appears well-developed and well-nourished. No distress.   Head: Normocephalic and atraumatic.   Neck: Normal range of motion. Neck supple. No thyroidmegaly.   Cardiovascular: Normal rate, regular rhythm, normal heart sounds and intact distal pulses.   Pulmonary/Chest: Effort normal and breath sounds normal. No stridor. No respiratory distress. No wheezes and no rales.   Abdominal: Soft. Bowel sounds are normal. No distension and no mass. No tenderness. No rebound and no guarding.   Musculoskeletal: No edema and no tenderness.   Skin: No rash or erythema.  Psychiatric: Normal mood and affect. Behavior is normal.       Assessment:      Crystalina was seen today for follow-up.    Diagnoses and associated orders for this visit:    Polycystic ovarian syndrome  - metFORMIN (GLUCOPHAGE) 1000 MG tablet; Take 1 tablet by mouth 2 times daily (with meals).    Hirsutism  - spironolactone (ALDACTONE) 25 MG tablet; Take 1 tablet by mouth  daily.             Plan:      Follow up in 1 month on POS

## 2012-10-10 ENCOUNTER — Telehealth (HOSPITAL_COMMUNITY): Payer: Self-pay

## 2012-10-10 ENCOUNTER — Other Ambulatory Visit (HOSPITAL_COMMUNITY): Payer: Self-pay | Admitting: Physician Assistant

## 2012-10-10 MED ORDER — SERTRALINE HCL 50 MG PO TABS: 50.0000 mg | ORAL_TABLET | Freq: Every day | ORAL | Status: DC

## 2012-10-13 ENCOUNTER — Ambulatory Visit (INDEPENDENT_AMBULATORY_CARE_PROVIDER_SITE_OTHER): Payer: Self-pay | Admitting: Physician Assistant

## 2012-10-13 DIAGNOSIS — F330 Major depressive disorder, recurrent, mild: Secondary | ICD-10-CM

## 2012-10-13 DIAGNOSIS — F411 Generalized anxiety disorder: Secondary | ICD-10-CM

## 2012-10-13 NOTE — Progress Notes (Signed)
   [facility] Follow-up Outpatient Visit  [name] October 06, 1975  Date: 10/13/12   Subjective: [name] presents today to followup on her treatment for depression and anxiety. She reports that she has had her ups and downs since he was last seen. She states that it started when her cousins dog had to be put down, then her cousin lost her pregnancy, then her ask boss died. She also expresses some disappointment in she cannot go on the cruise that she wanted to go, but she is excited that she is going on a cruise in June 2 to make a period  Doris reports that she still takes 2 hours to fall asleep, but she goes to bed at 10 and doesn't follow sleep until 12. She states that she cannot afford to go to the gym. She reports that her physician changed her diabetes meds and diagnosed her with Cushing's syndrome, and she has lost 10 pounds over a three-week period. She denies any anxiety. She denies any suicidal or homicidal ideation. She denies any auditory or visual hallucinations.  There were no vitals filed for this visit.  Mental Status Examination  Appearance: Casual Alert: Yes Attention: good  Cooperative: Yes Eye Contact: Good Speech: Clear and coherent Psychomotor Activity: Normal Memory/Concentration: Intact Oriented: person, place, time/date and situation Mood: Euthymic Affect: Congruent Thought Processes and Associations: Linear Fund of Knowledge: Good Thought Content: Normal Insight: Fair Judgement: Good  Diagnosis: Maj. depressive disorder, recurrent, mild; generalized anxiety disorder  Treatment Plan: We will continue her Wellbutrin XL 150 mg a day, Zoloft 50 mg daily, Remeron 30 mg at bedtime, and Vistaril 100 mg at bedtime. She will return for followup in 4 months.  Edson Deridder, PA-C

## 2012-10-15 ENCOUNTER — Other Ambulatory Visit (HOSPITAL_COMMUNITY): Payer: Self-pay | Admitting: *Deleted

## 2012-10-15 DIAGNOSIS — F331 Major depressive disorder, recurrent, moderate: Secondary | ICD-10-CM

## 2012-10-15 MED ORDER — MIRTAZAPINE 30 MG PO TABS: 30.0000 mg | ORAL_TABLET | Freq: Every day | ORAL | Status: DC

## 2012-10-15 MED ORDER — BUPROPION HCL ER (XL) 150 MG PO TB24: 150.0000 mg | ORAL_TABLET | Freq: Every day | ORAL | Status: DC

## 2012-10-15 MED ORDER — SERTRALINE HCL 50 MG PO TABS: 50.0000 mg | ORAL_TABLET | Freq: Every day | ORAL | Status: DC

## 2012-10-15 NOTE — Telephone Encounter (Signed)
Received faxed refill request for Zoloft and Remeron.Per EPIC,Zoloft sent 10/10/12 to [facility] and Remeron escribed to [facility] pharmacy 09/24/12.Per [facility]-Zoloft fax never received and Remeron never recv'd by [facility].Refills authorized by A.Watt,PA Wellbutrin XL 150 mg was deleted in error during refill process.Reinstated with "No Print" status

## 2012-10-29 NOTE — Telephone Encounter (Signed)
Please call pharmacy to see if a duplicate since Epic shows recent refills,  if they don't have the medicine in their system, can give verbal ok for the amount of refills left per epic (depending on month prescribed) since it's already in Epic.  In the meantime, this refill request will be refused as a duplicate.

## 2012-11-01 LAB — COMPREHENSIVE METABOLIC PANEL
ALT: 18 U/L (ref 10–40)
AST: 17 U/L (ref 15–37)
Albumin/Globulin Ratio: 1.4 (ref 1.1–2.2)
Albumin: 4.4 g/dL (ref 3.4–5.0)
Alkaline Phosphatase: 62 U/L (ref 40–129)
BUN: 10 mg/dL (ref 7–20)
CO2: 24 mmol/L (ref 21–32)
Calcium: 9.4 mg/dL (ref 8.3–10.6)
Chloride: 105 mmol/L (ref 99–110)
Creatinine: 0.6 mg/dL (ref 0.6–1.1)
GFR African American: >60 (ref >60)
GFR Non-African American: >60 (ref >60)
Globulin: 3.1 g/dL
Glucose: 83 mg/dL (ref 70–99)
Potassium: 4.1 mmol/L (ref 3.5–5.1)
Sodium: 143 mmol/L (ref 136–145)
Total Bilirubin: 0.2 mg/dL (ref 0.0–1.0)
Total Protein: 7.5 g/dL (ref 6.4–8.2)

## 2012-11-01 LAB — CBC WITH AUTO DIFFERENTIAL
Basophils %: 0.3 %
Basophils Absolute: 0 10*3/uL (ref 0.0–0.2)
Eosinophils %: 1.6 %
Eosinophils Absolute: 0.1 10*3/uL (ref 0.0–0.6)
Hematocrit: 37.9 % (ref 36.0–48.0)
Hemoglobin: 12.6 g/dL (ref 12.0–16.0)
Lymphocytes %: 31.5 %
Lymphocytes Absolute: 2.9 10*3/uL (ref 1.0–5.1)
MCH: 28.1 pg (ref 26.0–34.0)
MCHC: 33.2 g/dL (ref 31.0–36.0)
MCV: 84.6 fL (ref 80.0–100.0)
MPV: 7.4 fL (ref 5.0–10.5)
Monocytes %: 6.5 %
Monocytes Absolute: 0.6 10*3/uL (ref 0.0–1.3)
Neutrophils %: 60.1 %
Neutrophils Absolute: 5.5 10*3/uL (ref 1.7–7.7)
Platelets: 393 10*3/uL (ref 135–450)
RBC: 4.48 M/uL (ref 4.00–5.20)
RDW: 14 % (ref 12.4–15.4)
WBC: 9.1 10*3/uL (ref 4.0–11.0)

## 2012-11-01 LAB — LIPID PANEL
Cholesterol, Total: 209 mg/dL — ABNORMAL HIGH (ref 0–199)
HDL: 49 mg/dL (ref 40–60)
LDL Calculated: 126 mg/dL — ABNORMAL HIGH (ref <100)
Triglycerides: 169 mg/dL — ABNORMAL HIGH (ref 0–150)
VLDL Cholesterol Calculated: 34 mg/dL

## 2012-11-01 LAB — T4, FREE: T4 Free: 1 ng/ml (ref 0.9–1.8)

## 2012-11-01 LAB — TSH: TSH: 2 u[IU]/mL (ref 0.27–4.20)

## 2012-11-04 NOTE — Telephone Encounter (Signed)
Called pharmacy and the pharmacist stated that they have the medicine in their system.

## 2012-11-06 MED ORDER — LEVOTHYROXINE SODIUM 25 MCG PO TABS
25 MCG | ORAL_TABLET | Freq: Every day | ORAL | Status: DC
Start: 2012-11-06 — End: 2013-02-06

## 2012-11-06 MED ORDER — SPIRONOLACTONE 50 MG PO TABS
50 MG | ORAL_TABLET | Freq: Every day | ORAL | Status: DC
Start: 2012-11-06 — End: 2013-02-21

## 2012-11-06 NOTE — Patient Instructions (Signed)
Follow up Fasting early May for thyroid, hirsutism and cholesterol

## 2012-11-06 NOTE — Progress Notes (Signed)
Subjective:      Patient ID: [name] [name] is a 36 y.o. female.    HPI  Polycystic Ovarian Syndrome: diagnose by pelvic ultrasound and labs. She was placed on BCP 3 years ago but not interested in being on that due to her husband already had a vasectomy. No problems with metformin 1g bid. Not much change in facial hair growth but her period is now regular and she is now able to lose weight.   Hirsutism: not much of a difference with the spironolactone 25 mg qd.  Hypothyroidism: Recent symptoms: fatigue, weight gain, cold intolerance and constipation. She denies weight loss, heat intolerance, hair loss, dry skin, diarrhea, edema, anxiety, tremor, palpitations and dysphagia. Patient is going taking her medication consistently on an empty stomach.    No results found for this basename: LABA1C, LABMICR     Lab Results   Component Value Date    NA 143 11/01/2012    K 4.1 11/01/2012    CL 105 11/01/2012    CO2 24 11/01/2012    BUN 10 11/01/2012    CREATININE 0.6 11/01/2012    GLUCOSE 83 11/01/2012    CALCIUM 9.4 11/01/2012     Lab Results   Component Value Date    CHOL 209 11/01/2012    TRIG 169 11/01/2012    HDL 49 11/01/2012    LDLCALC 126 11/01/2012     Lab Results   Component Value Date    ALT 18 11/01/2012    AST 17 11/01/2012     Lab Results   Component Value Date    TSH 2.00 11/01/2012    T4FREE 1.0 11/01/2012     Lab Results   Component Value Date    WBC 9.1 11/01/2012    HGB 12.6 11/01/2012    HCT 37.9 11/01/2012    MCV 84.6 11/01/2012    PLT 393 11/01/2012     No results found for this basename: INR      No results found for this basename: PSA      No results found for this basename: LABURIC          Review of Systems  was negative except of what was stated on HPI      Objective:   Physical Exam  Nursing note and vitals reviewed.    Filed Vitals:    11/06/12 1421   BP: 120/74   Pulse: 86   Temp: 98.9 ??F (37.2 ??C)   Resp: 14   Height: 5\' 8"  (1.727 m)   Weight: 255 lb 6.4 oz (115.849 kg)     Wt Readings from Last 3 Encounters:    11/06/12 255 lb 6.4 oz (115.849 kg)   10/06/12 253 lb 12.8 oz (115.123 kg)   07/02/12 261 lb (118.389 kg)     BP Readings from Last 3 Encounters:   11/06/12 120/74   10/06/12 126/90   07/02/12 118/72     Body mass index is 38.84 kg/(m^2).  Constitutional: Patient appears well-developed and well-nourished. No distress.   Head: Normocephalic and atraumatic.   Neck: Normal range of motion. Neck supple. No thyroidmegaly.   Cardiovascular: Normal rate, regular rhythm, normal heart sounds and intact distal pulses.   Pulmonary/Chest: Effort normal and breath sounds normal. No stridor. No respiratory distress. No wheezes and no rales.   Abdominal: Soft. Bowel sounds are normal. No distension and no mass. No tenderness. No rebound and no guarding.   Musculoskeletal: No edema and no tenderness.   Skin:  No rash or erythema.  Psychiatric: Normal mood and affect. Behavior is normal.       Assessment:      [name] [name] was seen today for medication check.    Diagnoses and associated orders for this visit:    Hirsutism  - spironolactone (ALDACTONE) 50 MG tablet; Take 1 tablet by mouth daily.    Polycystic ovarian syndrome    Hypothyroid  - levothyroxine (SYNTHROID) 25 MCG tablet; Take 1 tablet by mouth Daily.        Outpatient Prescriptions Marked as Taking for the 11/06/12 encounter (Office Visit) with Randell Patient [name] Minella, MD   Medication Sig Dispense Refill   ??? spironolactone (ALDACTONE) 50 MG tablet Take 1 tablet by mouth daily.  30 tablet  2   ??? levothyroxine (SYNTHROID) 25 MCG tablet Take 1 tablet by mouth Daily.  90 tablet  0   ??? metFORMIN (GLUCOPHAGE) 1000 MG tablet Take 1 tablet by mouth 2 times daily (with meals).  180 tablet  1            Plan:      Follow up Fasting early May for thyroid, hirsutism and cholesterol

## 2012-11-07 ENCOUNTER — Telehealth (HOSPITAL_COMMUNITY): Payer: Self-pay

## 2012-11-10 ENCOUNTER — Other Ambulatory Visit (HOSPITAL_COMMUNITY): Payer: Self-pay | Admitting: Physician Assistant

## 2012-11-10 MED ORDER — BUPROPION HCL ER (XL) 150 MG PO TB24: 150.0000 mg | ORAL_TABLET | Freq: Every day | ORAL | Status: AC

## 2012-11-28 ENCOUNTER — Telehealth (HOSPITAL_COMMUNITY): Payer: Self-pay

## 2013-01-19 ENCOUNTER — Other Ambulatory Visit (HOSPITAL_COMMUNITY): Payer: Self-pay | Admitting: Physician Assistant

## 2013-02-05 NOTE — Patient Instructions (Signed)
Follow up within 3 months on thyroid

## 2013-02-05 NOTE — Progress Notes (Signed)
Subjective:      Patient ID: [name] [name] is a 37 y.o. female.    HPI  Polycystic Ovarian Syndrome: diagnose by pelvic ultrasound and labs on Metformin 1g bid. She was placed on BCP 3 years ago but not interested in being on that due to her husband already had a vasectomy. No problems with metformin 1g bid. Not much change in facial hair growth but her period is now regular and she is now able to lose weight.   Hirsutism: not much of a difference with the spironolactone 25 mg qd.   Hypothyroidism: Recent symptoms: improve fatigue, weight gain, cold intolerance and constipation on synthroid 25 mcg qd. She denies weight loss, heat intolerance, hair loss, dry skin, diarrhea, edema, anxiety, tremor, palpitations and dysphagia. Patient is going taking her medication consistently on an empty stomach.     No results found for this basename: LABA1C, LABMICR     Lab Results   Component Value Date    NA 143 11/01/2012    K 4.1 11/01/2012    CL 105 11/01/2012    CO2 24 11/01/2012    BUN 10 11/01/2012    CREATININE 0.6 11/01/2012    GLUCOSE 83 11/01/2012    CALCIUM 9.4 11/01/2012     Lab Results   Component Value Date    CHOL 209 11/01/2012    TRIG 169 11/01/2012    HDL 49 11/01/2012    LDLCALC 126 11/01/2012     Lab Results   Component Value Date    ALT 18 11/01/2012    AST 17 11/01/2012     Lab Results   Component Value Date    TSH 2.00 11/01/2012    T4FREE 1.0 11/01/2012     Lab Results   Component Value Date    WBC 9.1 11/01/2012    HGB 12.6 11/01/2012    HCT 37.9 11/01/2012    MCV 84.6 11/01/2012    PLT 393 11/01/2012     No results found for this basename: INR      No results found for this basename: PSA      No results found for this basename: LABURIC          Review of Systems  was negative except of what was stated on HPI      Objective:   Physical Exam  Nursing note and vitals reviewed.    Filed Vitals:    02/05/13 1314   BP: 130/80   Pulse: 82   Resp: 16   Height: 5\' 8"  (1.727 m)   Weight: 256 lb 12.8 oz (116.484 kg)     Wt Readings from  Last 3 Encounters:   02/05/13 256 lb 12.8 oz (116.484 kg)   11/06/12 255 lb 6.4 oz (115.849 kg)   10/06/12 253 lb 12.8 oz (115.123 kg)     BP Readings from Last 3 Encounters:   02/05/13 130/80   11/06/12 120/74   10/06/12 126/90     Body mass index is 39.06 kg/(m^2).  Constitutional: Patient appears well-developed and well-nourished. No distress.   Head: Normocephalic and atraumatic.   Neck: Normal range of motion. Neck supple. No thyroidmegaly.   Cardiovascular: Normal rate, regular rhythm, normal heart sounds and intact distal pulses.   Pulmonary/Chest: Effort normal and breath sounds normal. No stridor. No respiratory distress. No wheezes and no rales.   Abdominal: Soft. Bowel sounds are normal. No distension and no mass. No tenderness. No rebound and no guarding.   Musculoskeletal: No  edema and no tenderness.   Skin: No rash or erythema.  Psychiatric: Normal mood and affect. Behavior is normal.       Assessment:      Itzayana was seen today for thyroid problem.    Diagnoses and associated orders for this visit:    Hypothyroid  - T4, free  - TSH without Reflex        Outpatient Prescriptions Marked as Taking for the 02/05/13 encounter (Office Visit) with Randell Patient Kyilee Gregg, MD   Medication Sig Dispense Refill   ??? spironolactone (ALDACTONE) 50 MG tablet Take 1 tablet by mouth daily.  30 tablet  2   ??? levothyroxine (SYNTHROID) 25 MCG tablet Take 1 tablet by mouth Daily.  90 tablet  0   ??? metFORMIN (GLUCOPHAGE) 1000 MG tablet Take 1 tablet by mouth 2 times daily (with meals).  180 tablet  1            Plan:      Follow up within 3 months on thyroid

## 2013-02-06 ENCOUNTER — Encounter

## 2013-02-06 LAB — TSH: TSH: 1.12 u[IU]/mL (ref 0.27–4.20)

## 2013-02-06 LAB — T4, FREE: T4 Free: 1.1 ng/ml (ref 0.9–1.8)

## 2013-02-06 MED ORDER — LEVOTHYROXINE SODIUM 50 MCG PO TABS
50 MCG | ORAL_TABLET | Freq: Every day | ORAL | Status: DC
Start: 2013-02-06 — End: 2013-05-08

## 2013-02-10 ENCOUNTER — Ambulatory Visit (HOSPITAL_COMMUNITY): Payer: Self-pay | Admitting: Physician Assistant

## 2013-02-23 MED ORDER — SPIRONOLACTONE 50 MG PO TABS
50 MG | ORAL_TABLET | ORAL | Status: DC
Start: 2013-02-23 — End: 2013-05-07

## 2013-03-18 ENCOUNTER — Ambulatory Visit (HOSPITAL_COMMUNITY): Payer: Self-pay | Admitting: Physician Assistant

## 2013-04-08 ENCOUNTER — Emergency Department (HOSPITAL_COMMUNITY)
Admission: EM | Admit: 2013-04-08 | Discharge: 2013-04-08 | Disposition: A | Discharge status: Home / Self Care | Payer: BC Managed Care – PPO | Attending: Emergency Medicine | Admitting: Emergency Medicine

## 2013-04-08 ENCOUNTER — Encounter (HOSPITAL_COMMUNITY): Payer: Self-pay | Admitting: *Deleted

## 2013-04-08 DIAGNOSIS — Z862 Personal history of diseases of the blood and blood-forming organs and certain disorders involving the immune mechanism: Secondary | ICD-10-CM | POA: Insufficient documentation

## 2013-04-08 DIAGNOSIS — Z8639 Personal history of other endocrine, nutritional and metabolic disease: Secondary | ICD-10-CM | POA: Insufficient documentation

## 2013-04-08 DIAGNOSIS — Z87828 Personal history of other (healed) physical injury and trauma: Secondary | ICD-10-CM | POA: Insufficient documentation

## 2013-04-08 DIAGNOSIS — Z7982 Long term (current) use of aspirin: Secondary | ICD-10-CM | POA: Insufficient documentation

## 2013-04-08 DIAGNOSIS — E119 Type 2 diabetes mellitus without complications: Secondary | ICD-10-CM | POA: Insufficient documentation

## 2013-04-08 DIAGNOSIS — R42 Dizziness and giddiness: Secondary | ICD-10-CM | POA: Insufficient documentation

## 2013-04-08 DIAGNOSIS — Z87891 Personal history of nicotine dependence: Secondary | ICD-10-CM | POA: Insufficient documentation

## 2013-04-08 DIAGNOSIS — Z880 Allergy status to penicillin: Secondary | ICD-10-CM | POA: Insufficient documentation

## 2013-04-08 DIAGNOSIS — H53149 Visual discomfort, unspecified: Secondary | ICD-10-CM | POA: Insufficient documentation

## 2013-04-08 DIAGNOSIS — Z79899 Other long term (current) drug therapy: Secondary | ICD-10-CM | POA: Insufficient documentation

## 2013-04-08 DIAGNOSIS — R000 Tachycardia, unspecified: Secondary | ICD-10-CM | POA: Insufficient documentation

## 2013-04-08 DIAGNOSIS — R51 Headache: Secondary | ICD-10-CM | POA: Insufficient documentation

## 2013-04-08 DIAGNOSIS — Z794 Long term (current) use of insulin: Secondary | ICD-10-CM | POA: Insufficient documentation

## 2013-04-08 MED ORDER — DIPHENHYDRAMINE HCL 50 MG/ML IJ SOLN
25.0000 mg | Freq: Once | INTRAMUSCULAR | Status: AC
  Administered 2013-04-08: 25 mg via INTRAVENOUS
  Filled 2013-04-08: qty 1

## 2013-04-08 MED ORDER — METOCLOPRAMIDE HCL 5 MG/ML IJ SOLN
10.0000 mg | Freq: Once | INTRAMUSCULAR | Status: AC
  Administered 2013-04-08: 10 mg via INTRAVENOUS
  Filled 2013-04-08: qty 2

## 2013-04-08 MED ORDER — KETOROLAC TROMETHAMINE 30 MG/ML IJ SOLN
30.0000 mg | Freq: Once | INTRAMUSCULAR | Status: AC
  Administered 2013-04-08: 30 mg via INTRAVENOUS
  Filled 2013-04-08: qty 1

## 2013-04-08 MED ORDER — SODIUM CHLORIDE 0.9 % IV BOLUS (SEPSIS)
1000.0000 mL | Freq: Once | INTRAVENOUS | Status: AC
  Administered 2013-04-08: 1000 mL via INTRAVENOUS

## 2013-04-08 NOTE — ED Notes (Signed)
Pt reports headaches x3 days. Reports that she went to pcp yesterday, was given flonase for sinus, hx of sinus infection. Today pt woke up with more pain and increased dizziness. Family called pcp, pcp told them to come to ED.  Headache pain 8/10. sensativity to sound  On side note pt had sleep study in past, 8 years ago, was told she needed CPAP, but never got CPAP. pcp thought maybe low oxygen levels at night are causing headache. Pt should be getting a CPAP machine.

## 2013-04-08 NOTE — ED Provider Notes (Signed)
Medical screening examination/treatment/procedure(s) were performed by non-physician practitioner and as supervising physician I was immediately available for consultation/collaboration.   Richardean Canal, MD 04/08/13 [phone]

## 2013-04-08 NOTE — ED Provider Notes (Signed)
CSN: [id number]     Arrival date & time 04/08/13  1002 History     First MD Initiated Contact with Patient 04/08/13 1022     Chief Complaint  Patient presents with  . Migraine  . Dizziness   (Consider location/radiation/quality/duration/timing/severity/associated sxs/prior Treatment) HPI  37 year old female with history of compression, insulin-dependent diabetes, and hyperlipidemia presents for evaluations of headache. Patient reports for the past 2 or 3 days she has had persistent headache. Describe headaches as a sharp stabbing sensation to her right temporal which radiates to the left side of forehead. Headache is accompanied with lightheadedness and dizziness sensation. Described dizziness as room spinning, lasting for seconds and resolved without specific treatment. Patient reports feeling very tired in the morning and headache has becoming more persistent. No associated fever, double vision, cough, sore throat, neck pain, neck stiffness, chest pain shortness of breath, numbness or weakness, or rash. Headache for a very similar to prior sinus infection. She was seen by her primary care Dr. for headache yesterday, was prescribed Flonase. States she has been using medication with minimal relief. Patient also reports that she has a history of sleep apnea and currently using CPAP. Has been using her CPAP machine for the past 8 years. States she had a recent sleep study that shows low oxygen level at night. She is currently awaits for a new CPAP machine.  Past Medical History  Diagnosis Date  . Diabetes mellitus type II   . High cholesterol   . Lower leg, muscle or tendon injury   . 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 Cushing disease     pt reports cushing was ruled out, but still has s/s   Past Surgical History  Procedure Laterality Date  . Cesarean section      3   . Cholecystectomy     Family History  Problem Relation Age of Onset  .  Adopted: Yes  . Autism Son   . ADD / ADHD Son   . Apraxia Son    History  Substance Use Topics  . Smoking status: Former Smoker    Quit date: 07/25/1999  . Smokeless tobacco: Never Used  . Alcohol Use: Yes     Comment: occasional use at holidays and with family get togethers- every couple of months   OB History   Grav Para Term Preterm Abortions TAB SAB Ect Mult Living                 Review of Systems  All other systems reviewed and are negative.    Allergies  Penicillins  Home Medications   Current Outpatient Rx  Name  Route  Sig  Dispense  Refill  . amitriptyline (ELAVIL) 50 MG tablet   Oral   Take 50 mg by mouth at bedtime.         Marland Kitchen aspirin EC 81 MG tablet   Oral   Take 81 mg by mouth daily.         Marland Kitchen buPROPion (WELLBUTRIN XL) 150 MG 24 hr tablet   Oral   Take 1 tablet (150 mg total) by mouth daily.   90 tablet   0   . Cholecalciferol 2000 UNITS TABS   Oral   Take 1 tablet by mouth daily.         Marland Kitchen gabapentin (NEURONTIN) 300 MG capsule   Oral   Take 300 mg by mouth 2 (two) times daily.          Marland Kitchen  gemfibrozil (LOPID) 600 MG tablet   Oral   Take 600 mg by mouth 2 (two) times daily before a meal.         . glipiZIDE (GLUCOTROL XL) 2.5 MG 24 hr tablet   Oral   Take 5 mg by mouth 2 (two) times daily.         Marland Kitchen HYDROcodone-acetaminophen (VICODIN) 5-500 MG per tablet   Oral   Take 1 tablet by mouth every 6 (six) hours as needed for pain.   20 tablet   0   . hydrOXYzine (VISTARIL) 50 MG capsule   Oral   Take 100 mg by mouth at bedtime as needed. sleep         . hydrOXYzine (VISTARIL) 50 MG capsule      TAKE 2 CAPSULES (100 MG TOTAL) BY MOUTH AT BEDTIME.   60 capsule   1   . insulin glargine (LANTUS) 100 UNIT/ML injection   Subcutaneous   Inject 80 Units into the skin 2 (two) times daily.          Marland Kitchen levothyroxine (SYNTHROID, LEVOTHROID) 100 MCG tablet   Oral   Take 100 mcg by mouth daily.         . meloxicam (MOBIC) 15  MG tablet   Oral   Take 1 tablet (15 mg total) by mouth daily.   30 tablet   6   . metFORMIN (GLUMETZA) 500 MG (MOD) 24 hr tablet   Oral   Take 500 mg by mouth 2 (two) times daily with a meal.         . mirtazapine (REMERON) 30 MG tablet   Oral   Take 1 tablet (30 mg total) by mouth at bedtime.   30 tablet   3   . niacin 500 MG tablet   Oral   Take 500 mg by mouth daily with breakfast.         . omega-3 acid ethyl esters (LOVAZA) 1 G capsule   Oral   Take 2 g by mouth 2 (two) times daily.         . quinapril (ACCUPRIL) 5 MG tablet   Oral   Take 5 mg by mouth at bedtime.         . Selenium (SELENIMIN PO)   Oral   Take 1 capsule by mouth daily.         . sertraline (ZOLOFT) 50 MG tablet   Oral   Take 1 tablet (50 mg total) by mouth daily.   30 tablet   3    BP 115/89  Pulse 105  Temp(Src) 98.6 F (37 C) (Oral)  Resp 16  SpO2 96% Physical Exam  Nursing note and vitals reviewed. Constitutional: She is oriented to person, place, and time. She appears well-developed and well-nourished.  Morbidly obese  HENT:  Head: Normocephalic and atraumatic.  Right Ear: External ear normal.  Left Ear: External ear normal.  Nose: Nose normal.  Mouth/Throat: Oropharynx is clear and moist. No oropharyngeal exudate.  Eyes: Conjunctivae and EOM are normal. Pupils are equal, round, and reactive to light.  Photophobia  Neck: Normal range of motion. Neck supple.  No nuchal rigidity  Cardiovascular:  mild tachycardiac without murmurs, rubs, or gallops  Pulmonary/Chest: Effort normal and breath sounds normal.  Abdominal: Soft.  Neurological: She is alert and oriented to person, place, and time. She has normal strength. No cranial nerve deficit or sensory deficit. GCS eye subscore is 4. GCS verbal subscore is 5.  GCS motor subscore is 6.    ED Course   Procedures (including critical care time)  11:11 AM Headache similar to previous, no fever, neck stiffness, neuro  findings or new symptoms to suggest more serious etiology.  I don't think SAH, ICH, meningitis, encephalitis, mass at this time.  No recent trauma.  I don't feel imaging necessary at this time.  Plan to control symptoms.  1:52 PM Patient felt much better after receiving a migraine cocktail. Able to ambulate without difficulty. Patient is stable for discharge.  Return precaution given.    Labs Reviewed - No data to display No results found. 1. Headache     MDM  BP 112/61  Pulse 88  Temp(Src) 98.6 F (37 C) (Oral)  Resp 16  SpO2 96%   Fayrene Helper, PA-C 04/08/13 1353

## 2013-04-08 NOTE — Progress Notes (Addendum)
Pt states specialist, Dr Bayard Hugger in [place] and in [place] was Museum/gallery curator, pcp EPIC updated

## 2013-05-07 MED ORDER — METFORMIN HCL 1000 MG PO TABS
1000 MG | ORAL_TABLET | Freq: Two times a day (BID) | ORAL | Status: DC
Start: 2013-05-07 — End: 2013-08-05

## 2013-05-07 MED ORDER — SPIRONOLACTONE 50 MG PO TABS
50 MG | ORAL_TABLET | ORAL | Status: DC
Start: 2013-05-07 — End: 2013-08-05

## 2013-05-07 NOTE — Progress Notes (Signed)
Subjective:      Patient ID: [name] [name] is a 37 y.o. female.    HPI  Polycystic Ovarian Syndrome: diagnose by pelvic ultrasound and labs on Metformin 1g bid. She was placed on BCP 3 years ago but not interested in being on that due to her husband already had a vasectomy. No problems with metformin 1g bid. Not much change in facial hair growth but her period is now regular and she is now able to lose weight.   Hirsutism: not much of a difference with the spironolactone 25 mg qd.   Hypothyroidism: Recent symptoms: improve  on synthroid 50 mcg qd. She denies fatigue, weight gain, cold intolerance and constipation, weight loss, heat intolerance, hair loss, dry skin, diarrhea, edema, anxiety, tremor, palpitations and dysphagia. Patient is going taking her medication consistently on an empty stomach.     No results found for this basenameSynetta [name], LABMICR     Lab Results   Component Value Date    NA 143 11/01/2012    K 4.1 11/01/2012    CL 105 11/01/2012    CO2 24 11/01/2012    BUN 10 11/01/2012    CREATININE 0.6 11/01/2012    GLUCOSE 83 11/01/2012    CALCIUM 9.4 11/01/2012     Lab Results   Component Value Date    CHOL 209 11/01/2012    TRIG 169 11/01/2012    HDL 49 11/01/2012    LDLCALC 126 11/01/2012     Lab Results   Component Value Date    ALT 18 11/01/2012    AST 17 11/01/2012     Lab Results   Component Value Date    TSH 1.12 02/05/2013    TSH 2.00 11/01/2012    T4FREE 1.1 02/05/2013    T4FREE 1.0 11/01/2012     Lab Results   Component Value Date    WBC 9.1 11/01/2012    HGB 12.6 11/01/2012    HCT 37.9 11/01/2012    MCV 84.6 11/01/2012    PLT 393 11/01/2012     No results found for this basename: INR      No results found for this basename: PSA      No results found for this basename: LABURIC          Review of Systems    Objective:   Physical Exam  Nursing note and vitals reviewed.    Filed Vitals:    05/07/13 1334   BP: 110/64   Pulse: 85   Temp: 98.7 ??F (37.1 ??C)   TempSrc: Tympanic   Resp: 20   Height: 5\' 8"  (1.727 m)   Weight: 257  lb 12.8 oz (116.937 kg)   SpO2: 98%     Wt Readings from Last 3 Encounters:   05/07/13 257 lb 12.8 oz (116.937 kg)   02/05/13 256 lb 12.8 oz (116.484 kg)   11/06/12 255 lb 6.4 oz (115.849 kg)     BP Readings from Last 3 Encounters:   05/07/13 110/64   02/05/13 130/80   11/06/12 120/74     Body mass index is 39.21 kg/(m^2).  Constitutional: Patient appears well-developed and well-nourished. No distress.   Head: Normocephalic and atraumatic.   Neck: Normal range of motion. Neck supple. No thyroidmegaly.   Cardiovascular: Normal rate, regular rhythm, normal heart sounds and intact distal pulses.   Pulmonary/Chest: Effort normal and breath sounds normal. No stridor. No respiratory distress. No wheezes and no rales.   Abdominal: Soft. Bowel sounds are normal.  No distension and no mass. No tenderness. No rebound and no guarding.   Musculoskeletal: No edema and no tenderness.   Skin: No rash or erythema.  Psychiatric: Normal mood and affect. Behavior is normal.       Assessment:      [name] [name] was seen today for 3 month follow-up.    Diagnoses and associated orders for this visit:    Thyroid activity decreased  - T4, free    Polycystic ovarian syndrome  - metFORMIN (GLUCOPHAGE) 1000 MG tablet; Take 1 tablet by mouth 2 times daily (with meals).    Hirsutism  - spironolactone (ALDACTONE) 50 MG tablet; TAKE ONE TABLET BY MOUTH EVERY DAY             Plan:      Follow up for a Fasting Physical within 3 months

## 2013-05-07 NOTE — Patient Instructions (Signed)
Follow up for a Fasting Physical within 3 months

## 2013-05-08 ENCOUNTER — Encounter

## 2013-05-08 LAB — T4, FREE: T4 Free: 1.1 ng/ml (ref 0.9–1.8)

## 2013-05-08 MED ORDER — LEVOTHYROXINE SODIUM 75 MCG PO TABS
75 MCG | ORAL_TABLET | Freq: Every day | ORAL | Status: DC
Start: 2013-05-08 — End: 2013-08-06

## 2013-07-06 ENCOUNTER — Other Ambulatory Visit: Payer: Self-pay | Admitting: Orthopaedic Surgery

## 2013-07-06 DIAGNOSIS — M542 Cervicalgia: Secondary | ICD-10-CM

## 2013-07-15 ENCOUNTER — Ambulatory Visit
Admission: RE | Admit: 2013-07-15 | Discharge: 2013-07-15 | Disposition: A | Discharge status: Home / Self Care | Payer: BC Managed Care – PPO | Source: Ambulatory Visit | Attending: Orthopaedic Surgery | Admitting: Orthopaedic Surgery

## 2013-07-15 DIAGNOSIS — M542 Cervicalgia: Secondary | ICD-10-CM

## 2013-08-05 LAB — CBC WITH AUTO DIFFERENTIAL
Basophils %: 0.6 %
Basophils Absolute: 0.1 10*3/uL (ref 0.0–0.2)
Eosinophils %: 2 %
Eosinophils Absolute: 0.2 10*3/uL (ref 0.0–0.6)
Hematocrit: 37.7 % (ref 36.0–48.0)
Hemoglobin: 12.2 g/dL (ref 12.0–16.0)
Lymphocytes %: 32.5 %
Lymphocytes Absolute: 2.9 10*3/uL (ref 1.0–5.1)
MCH: 26.6 pg (ref 26.0–34.0)
MCHC: 32.4 g/dL (ref 31.0–36.0)
MCV: 81.9 fL (ref 80.0–100.0)
MPV: 7.4 fL (ref 5.0–10.5)
Monocytes %: 6.2 %
Monocytes Absolute: 0.6 10*3/uL (ref 0.0–1.3)
Neutrophils %: 58.7 %
Neutrophils Absolute: 5.2 10*3/uL (ref 1.7–7.7)
Platelets: 407 10*3/uL (ref 135–450)
RBC: 4.6 M/uL (ref 4.00–5.20)
RDW: 14.7 % (ref 12.4–15.4)
WBC: 8.9 10*3/uL (ref 4.0–11.0)

## 2013-08-05 LAB — LIPID PANEL
Cholesterol, Total: 235 mg/dL — ABNORMAL HIGH (ref 0–199)
HDL: 45 mg/dL (ref 40–60)
LDL Calculated: 148 mg/dL — ABNORMAL HIGH (ref <100)
Triglycerides: 210 mg/dL — ABNORMAL HIGH (ref 0–150)
VLDL Cholesterol Calculated: 42 mg/dL

## 2013-08-05 LAB — COMPREHENSIVE METABOLIC PANEL
ALT: 15 U/L (ref 10–40)
AST: 14 U/L — ABNORMAL LOW (ref 15–37)
Albumin/Globulin Ratio: 1.5 (ref 1.1–2.2)
Albumin: 4.6 g/dL (ref 3.4–5.0)
Alkaline Phosphatase: 70 U/L (ref 40–129)
BUN: 12 mg/dL (ref 7–20)
CO2: 23 mmol/L (ref 21–32)
Calcium: 9.6 mg/dL (ref 8.3–10.6)
Chloride: 100 mmol/L (ref 99–110)
Creatinine: 0.6 mg/dL (ref 0.6–1.1)
GFR African American: >60 (ref >60)
GFR Non-African American: >60 (ref >60)
Globulin: 3 g/dL
Glucose: 84 mg/dL (ref 70–99)
Potassium: 4.1 mmol/L (ref 3.5–5.1)
Sodium: 140 mmol/L (ref 136–145)
Total Bilirubin: 0.3 mg/dL (ref 0.0–1.0)
Total Protein: 7.6 g/dL (ref 6.4–8.2)

## 2013-08-05 LAB — T4, FREE: T4 Free: 1.2 ng/ml (ref 0.9–1.8)

## 2013-08-05 MED ORDER — METFORMIN HCL 1000 MG PO TABS
1000 MG | ORAL_TABLET | Freq: Two times a day (BID) | ORAL | Status: DC
Start: 2013-08-05 — End: 2013-12-22

## 2013-08-05 MED ORDER — CREAM BASE EX CREA
Freq: Four times a day (QID) | CUTANEOUS | Status: DC | PRN
Start: 2013-08-05 — End: 2016-08-27

## 2013-08-05 MED ORDER — SPIRONOLACTONE 50 MG PO TABS
50 MG | ORAL_TABLET | ORAL | Status: DC
Start: 2013-08-05 — End: 2016-08-27
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
## 2013-08-05 NOTE — Patient Instructions (Signed)
Preventive plan of care for [name] [name]        08/05/2013           Preventive Measures Status       Recommendations for screening           Cervical Cancer Screen   PAP smear:  Repeat yearly   Osteoporosis Screen   DEXA scan  This test is not clinically indicated   Diabetes Screen  Glucose (mg/dL)   Date Value   4/69/6295 83     Test recommended and ordered  Repeat yearly   Cholesterol Screen  Lab Results   Component Value Date    CHOL 209* 11/01/2012    TRIG 169* 11/01/2012    HDL 49 11/01/2012    LDLCALC 126* 11/01/2012    Test recommended and ordered   Aspirin for Cardiovascular Prevention   No Not indicated   Weight: Body mass index is 39.48 kg/(m^2).  5\' 8"  (1.727 m)259 lb 9.6 oz (117.754 kg)    Your BMI is 25 or greater, which indicates that you are overweight   Living Will: No   Discuss with family    Recommended Immunizations      There is no immunization history on file for this patient.     Influenza vaccine:  recommended every fall         Other Recommendations ??   See a dentist every 6 months  ?? Try to get at least 30 minutes of exercise 3-5 days per week  ?? Always wear a seat belt when traveling in a car  ?? Always wear a helmet when riding a bicycle or motorcycle  ?? When exposed to the sun, use a sunscreen that protects against both UVA and UVB radiation with an SPF of 30 or greater- reapply every 2 to 3 hours or after sweating, drying off with a towel, or swimming  ?? You need 500 mg of calcium and 1000-2000 IU of vitamin D per day- it is possible to meet your calcium requirement with diet alone, but a vitamin D supplement is usually necessary               Follow up in 6 months for thyroid

## 2013-08-06 ENCOUNTER — Encounter

## 2013-08-06 MED ORDER — LEVOTHYROXINE SODIUM 100 MCG PO TABS
100 MCG | ORAL_TABLET | Freq: Every day | ORAL | Status: DC
Start: 2013-08-06 — End: 2013-11-10

## 2013-11-10 NOTE — Telephone Encounter (Signed)
Inform patient NO REFILLS until patient calls to schedule for a follow up ASAP since dose was increase 3 months ago.  Can request refill again once appointment is made.

## 2013-11-10 NOTE — Telephone Encounter (Signed)
Next office visit   Visit date not found

## 2013-11-11 MED ORDER — LEVOTHYROXINE SODIUM 100 MCG PO TABS
100 MCG | ORAL_TABLET | ORAL | Status: DC
Start: 2013-11-11 — End: 2013-12-08

## 2013-11-11 NOTE — Telephone Encounter (Signed)
Inform patient to schedule for a follow up.  Will give 30 days and if not follow up, no more future refills.

## 2013-11-11 NOTE — Telephone Encounter (Signed)
Pt has been informed and will call office back to schedule follow up appointment

## 2013-11-29 ENCOUNTER — Encounter (HOSPITAL_COMMUNITY): Payer: Self-pay | Admitting: Emergency Medicine

## 2013-11-29 ENCOUNTER — Emergency Department (HOSPITAL_COMMUNITY)
Admission: EM | Admit: 2013-11-29 | Discharge: 2013-11-29 | Disposition: A | Discharge status: Home / Self Care | Payer: BC Managed Care – PPO | Source: Home / Self Care | Attending: Emergency Medicine | Admitting: Emergency Medicine

## 2013-11-29 ENCOUNTER — Emergency Department (INDEPENDENT_AMBULATORY_CARE_PROVIDER_SITE_OTHER): Payer: BC Managed Care – PPO

## 2013-11-29 DIAGNOSIS — X58XXXA Exposure to other specified factors, initial encounter: Secondary | ICD-10-CM

## 2013-11-29 DIAGNOSIS — S52123A Displaced fracture of head of unspecified radius, initial encounter for closed fracture: Secondary | ICD-10-CM

## 2013-11-29 LAB — CBC WITH DIFFERENTIAL/PLATELET
BASOS ABS: 0 10*3/uL (ref 0.0–0.1)
Basophils Relative: 0 % (ref 0–1)
EOS ABS: 0.1 10*3/uL (ref 0.0–0.7)
EOS PCT: 1 % (ref 0–5)
HEMATOCRIT: 45.3 % (ref 36.0–46.0)
Hemoglobin: 15.9 g/dL — ABNORMAL HIGH (ref 12.0–15.0)
LYMPHS PCT: 23 % (ref 12–46)
Lymphs Abs: 2.5 10*3/uL (ref 0.7–4.0)
MCH: 29.4 pg (ref 26.0–34.0)
MCHC: 35.1 g/dL (ref 30.0–36.0)
MCV: 83.9 fL (ref 78.0–100.0)
MONO ABS: 0.5 10*3/uL (ref 0.1–1.0)
Monocytes Relative: 5 % (ref 3–12)
Neutro Abs: 7.9 10*3/uL — ABNORMAL HIGH (ref 1.7–7.7)
Neutrophils Relative %: 71 % (ref 43–77)
PLATELETS: 388 10*3/uL (ref 150–400)
RBC: 5.4 MIL/uL — ABNORMAL HIGH (ref 3.87–5.11)
RDW: 13.3 % (ref 11.5–15.5)
WBC: 11.1 10*3/uL — AB (ref 4.0–10.5)

## 2013-11-29 LAB — URIC ACID: URIC ACID, SERUM: 5.7 mg/dL (ref 2.4–7.0)

## 2013-11-29 LAB — SEDIMENTATION RATE: Sed Rate: 20 mm/hr (ref 0–22)

## 2013-11-29 LAB — C-REACTIVE PROTEIN: CRP: 3.2 mg/dL — ABNORMAL HIGH (ref <0.60)

## 2013-11-29 MED ORDER — KETOROLAC TROMETHAMINE 60 MG/2ML IM SOLN
60.0000 mg | Freq: Once | INTRAMUSCULAR | Status: AC
  Administered 2013-11-29: 60 mg via INTRAMUSCULAR

## 2013-11-29 MED ORDER — KETOROLAC TROMETHAMINE 60 MG/2ML IM SOLN
INTRAMUSCULAR | Status: AC
  Filled 2013-11-29: qty 2

## 2013-11-29 MED ORDER — OXYCODONE-ACETAMINOPHEN 5-325 MG PO TABS: ORAL_TABLET | ORAL | Status: DC

## 2013-11-29 NOTE — Discharge Instructions (Signed)
Cast or Splint Care  Casts and splints support injured limbs and keep bones from moving while they heal. It is important to care for your cast or splint at home.   HOME CARE INSTRUCTIONS   Keep the cast or splint uncovered during the drying period. It can take 24 to 48 hours to dry if it is made of plaster. A fiberglass cast will dry in less than 1 hour.   Do not rest the cast on anything harder than a pillow for the first 24 hours.   Do not put weight on your injured limb or apply pressure to the cast until your health care provider gives you permission.   Keep the cast or splint dry. Wet casts or splints can lose their shape and may not support the limb as well. A wet cast that has lost its shape can also create harmful pressure on your skin when it dries. Also, wet skin can become infected.   Cover the cast or splint with a plastic bag when bathing or when out in the rain or snow. If the cast is on the trunk of the body, take sponge baths until the cast is removed.   If your cast does become wet, dry it with a towel or a blow dryer on the cool setting only.   Keep your cast or splint clean. Soiled casts may be wiped with a moistened cloth.   Do not place any hard or soft foreign objects under your cast or splint, such as cotton, toilet paper, lotion, or powder.   Do not try to scratch the skin under the cast with any object. The object could get stuck inside the cast. Also, scratching could lead to an infection. If itching is a problem, use a blow dryer on a cool setting to relieve discomfort.   Do not trim or cut your cast or remove padding from inside of it.   Exercise all joints next to the injury that are not immobilized by the cast or splint. For example, if you have a long leg cast, exercise the hip joint and toes. If you have an arm cast or splint, exercise the shoulder, elbow, thumb, and fingers.   Elevate your injured arm or leg on 1 or 2 pillows for the first 1 to 3 days to decrease  swelling and pain.It is best if you can comfortably elevate your cast so it is higher than your heart.  SEEK MEDICAL CARE IF:    Your cast or splint cracks.   Your cast or splint is too tight or too loose.   You have unbearable itching inside the cast.   Your cast becomes wet or develops a soft spot or area.   You have a bad smell coming from inside your cast.   You get an object stuck under your cast.   Your skin around the cast becomes red or raw.   You have new pain or worsening pain after the cast has been applied.  SEEK IMMEDIATE MEDICAL CARE IF:    You have fluid leaking through the cast.   You are unable to move your fingers or toes.   You have discolored (blue or white), cool, painful, or very swollen fingers or toes beyond the cast.   You have tingling or numbness around the injured area.   You have severe pain or pressure under the cast.   You have any difficulty with your breathing or have shortness of breath.   You have chest 

## 2013-11-29 NOTE — ED Notes (Signed)
Patient complains of right elbow pain for approx 4 weeks, states that the pain radiates down to wrist. Scheduled to see her PCP tomorrow but states the pain was to bad to wait.

## 2013-11-29 NOTE — ED Notes (Addendum)
CRP 3.2 H.  Message sent to Dr. Jake Michaelis.  Roselyn Meier 11/29/2013 No further action needed per Dr. Jake Michaelis. 12/01/2013
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
## 2013-11-29 NOTE — Progress Notes (Signed)
Orthopedic Tech Progress Note Patient Details:  [name] Youman Jan 10, 1976 [id number]  Ortho Devices Type of Ortho Device: Ace wrap;Long arm splint Ortho Device/Splint Interventions: Application   Cammer, Theodoro Parma 11/29/2013, 2:17 PM

## 2013-12-09 MED ORDER — LEVOTHYROXINE SODIUM 100 MCG PO TABS
100 MCG | ORAL_TABLET | ORAL | Status: DC
Start: 2013-12-09 — End: 2013-12-23

## 2013-12-09 NOTE — Telephone Encounter (Signed)
E-Rx sent, patient was advised prior to recheck with Dr Ngu since thyroid med dose changed and needs rechecked and has appointment with Dr Ngu 12/22/2013 2:40 PM

## 2013-12-15 NOTE — Telephone Encounter (Deleted)
fluticasone (FLONASE) 50 MCG/ACT nasal spray 3 Bottle 3 07/28/2013       Sig - Route: 2 sprays by Nasal route daily. - Nasal     E-Prescribing Status: Receipt confirmed by pharmacy (07/28/2013 1:01 PM EST)

## 2013-12-15 NOTE — Telephone Encounter (Signed)
From: Ericka Nemetz  To: Mahalia LongestMing E Ashworth, MD  Sent: 12/15/2013 7:15 AM EDT  Subject: Medication Renewal Request    Original authorizing provider: Mahalia LongestMING E ASHWORTH, MD    Tiaira Long would like a refill of the following medications:  levothyroxine (SYNTHROID) 100 MCG tablet Mahalia Longest[MING E ASHWORTH, MD]    [facility] [address] - [place], [place] - [address] - [address] [phone] F [phone]    Comment:

## 2013-12-22 MED ORDER — METFORMIN HCL 1000 MG PO TABS
1000 MG | ORAL_TABLET | Freq: Two times a day (BID) | ORAL | Status: DC
Start: 2013-12-22 — End: 2016-08-27

## 2013-12-22 NOTE — Progress Notes (Signed)
Subjective:      Patient ID: [name] [name] is a 38 y.o. female.    HPI     Polycystic Ovarian Syndrome: diagnose by pelvic ultrasound and labs on Metformin 1g bid. She was placed on BCP 3 years ago but not interested in being on that due to her husband already had a vasectomy. No problems with metformin 1g bid. Not much change in facial hair growth but her period is now regular and she is now able to lose weight.   Hirsutism: not much of a difference with the spironolactone 25 mg qd.   Hypothyroidism: Recent symptoms: improve fatigue but still tired on synthroid 100 mcg qd. She denies fatigue, weight gain, cold intolerance and constipation, weight loss, heat intolerance, hair loss, dry skin, diarrhea, edema, anxiety, tremor, palpitations and dysphagia. Patient is going taking her medication consistently on an empty stomach.   Hyperlipidemia:  Patient is  following a low fat, low cholesterol diet.  She is not exercising regularly. OTC Supplements: none.  Eyes have been twitching for the past 3 days.    No results found for this basename: LABA1C, LABMICR     Lab Results   Component Value Date    NA 140 08/05/2013    K 4.1 08/05/2013    CL 100 08/05/2013    CO2 23 08/05/2013    BUN 12 08/05/2013    CREATININE 0.6 08/05/2013    GLUCOSE 84 08/05/2013    CALCIUM 9.6 08/05/2013     Lab Results   Component Value Date    CHOL 235 08/05/2013    TRIG 210 08/05/2013    HDL 45 08/05/2013    LDLCALC 148 08/05/2013     Lab Results   Component Value Date    ALT 15 08/05/2013    AST 14* 08/05/2013     Lab Results   Component Value Date    TSH 1.12 02/05/2013    TSH 2.00 11/01/2012    T4FREE 1.2 08/05/2013    T4FREE 1.1 05/07/2013     Lab Results   Component Value Date    WBC 8.9 08/05/2013    HGB 12.2 08/05/2013    HCT 37.7 08/05/2013    MCV 81.9 08/05/2013    PLT 407 08/05/2013     No results found for this basename: INR      No results found for this basename: PSA      No results found for this basename: LABURIC          Review of  Systems  was negative except of what was stated on HPI      Objective:   Physical Exam  Nursing note and vitals reviewed.    Filed Vitals:    12/22/13 1447   BP: 126/80   Pulse: 80   Resp: 16   Height: 5\' 8"  (1.727 m)   Weight: 266 lb (120.657 kg)     Wt Readings from Last 3 Encounters:   12/22/13 266 lb (120.657 kg)   08/05/13 259 lb 9.6 oz (117.754 kg)   05/07/13 257 lb 12.8 oz (116.937 kg)     BP Readings from Last 3 Encounters:   12/22/13 126/80   08/05/13 110/74   05/07/13 110/64     Body mass index is 40.45 kg/(m^2).  Constitutional: Patient appears well-developed and well-nourished. No distress.   Head: Normocephalic and atraumatic.   Neck: Normal range of motion. Neck supple. No thyroidmegaly.   Cardiovascular: Normal rate, regular rhythm, normal heart sounds  and intact distal pulses.   Pulmonary/Chest: Effort normal and breath sounds normal. No stridor. No respiratory distress. No wheezes and no rales.   Abdominal: Soft. Bowel sounds are normal. No distension and no mass. No tenderness. No rebound and no guarding.   Musculoskeletal: No edema and no tenderness.   Skin: No rash or erythema.  Psychiatric: Normal mood and affect. Behavior is normal.       Assessment:      [name] [name] was seen today for medication check.    Diagnoses and associated orders for this visit:    Thyroid activity decreased  - T4, free    Hirsutism    Polycystic ovarian syndrome  - metFORMIN (GLUCOPHAGE) 1000 MG tablet; Take 1 tablet by mouth 2 times daily (with meals).             Plan:      Follow up Fasting within 3 months on thyroid and cholesterol

## 2013-12-22 NOTE — Patient Instructions (Signed)
Return Fasting within 3 months on thyroid and cholesterol.

## 2013-12-23 ENCOUNTER — Encounter

## 2013-12-23 LAB — T4, FREE: T4 Free: 1.1 ng/ml (ref 0.9–1.8)

## 2013-12-23 MED ORDER — LEVOTHYROXINE SODIUM 125 MCG PO TABS
125 MCG | ORAL_TABLET | Freq: Every day | ORAL | Status: DC
Start: 2013-12-23 — End: 2014-04-18

## 2014-01-05 ENCOUNTER — Ambulatory Visit: Payer: BC Managed Care – PPO | Attending: Sports Medicine

## 2014-01-05 DIAGNOSIS — M6281 Muscle weakness (generalized): Secondary | ICD-10-CM | POA: Insufficient documentation

## 2014-01-05 DIAGNOSIS — IMO0001 Reserved for inherently not codable concepts without codable children: Secondary | ICD-10-CM | POA: Insufficient documentation

## 2014-01-05 DIAGNOSIS — M25539 Pain in unspecified wrist: Secondary | ICD-10-CM | POA: Insufficient documentation

## 2014-01-07 NOTE — Telephone Encounter (Signed)
Left message to call back on  voice mail Re: clarify mcg, change in pharm.?  LF:  levothyroxine (SYNTHROID) 125 MCG tablet 90 tablet 0 12/23/2013       Sig - Route: Take 1 tablet by mouth Daily. - Oral     E-Prescribing Status: Receipt confirmed by pharmacy (12/23/2013 2:00 PM EDT)         Read by Rosario JacksAmy Leibensperger at 12/23/2013 4:43 PM     Your thyroid has not improved from last time so I have sent in a higher dose 125 mcg a day and f/u within 3 months to repeat test.

## 2014-01-08 NOTE — Telephone Encounter (Signed)
I don't understand this message back to me, does the pt have a question or requesting a refill??  I have already sent in the higher dose in my note to [facility].  MA, please do not ?? Mark since I don't understand what you're or the patient is confused about, please be clear.

## 2014-01-08 NOTE — Telephone Encounter (Signed)
Next office visit   Visit date not found

## 2014-01-13 ENCOUNTER — Ambulatory Visit: Payer: BC Managed Care – PPO | Attending: Sports Medicine

## 2014-01-13 DIAGNOSIS — M25539 Pain in unspecified wrist: Secondary | ICD-10-CM | POA: Insufficient documentation

## 2014-01-13 DIAGNOSIS — M6281 Muscle weakness (generalized): Secondary | ICD-10-CM | POA: Insufficient documentation

## 2014-01-13 DIAGNOSIS — IMO0001 Reserved for inherently not codable concepts without codable children: Secondary | ICD-10-CM | POA: Insufficient documentation

## 2014-01-15 ENCOUNTER — Ambulatory Visit: Payer: BC Managed Care – PPO

## 2014-01-19 ENCOUNTER — Ambulatory Visit: Payer: BC Managed Care – PPO

## 2014-01-21 ENCOUNTER — Ambulatory Visit: Payer: BC Managed Care – PPO | Admitting: Physical Therapy

## 2014-01-25 ENCOUNTER — Ambulatory Visit: Payer: BC Managed Care – PPO | Admitting: Rehabilitation

## 2014-01-26 ENCOUNTER — Ambulatory Visit: Payer: BC Managed Care – PPO | Admitting: Rehabilitation

## 2014-02-03 ENCOUNTER — Encounter: Payer: BC Managed Care – PPO | Admitting: Rehabilitation

## 2014-02-23 ENCOUNTER — Ambulatory Visit: Payer: BC Managed Care – PPO | Attending: Sports Medicine

## 2014-02-23 DIAGNOSIS — M6281 Muscle weakness (generalized): Secondary | ICD-10-CM | POA: Insufficient documentation

## 2014-02-23 DIAGNOSIS — IMO0001 Reserved for inherently not codable concepts without codable children: Secondary | ICD-10-CM | POA: Insufficient documentation

## 2014-02-23 DIAGNOSIS — M25539 Pain in unspecified wrist: Secondary | ICD-10-CM | POA: Insufficient documentation

## 2014-02-25 ENCOUNTER — Encounter: Payer: BC Managed Care – PPO | Admitting: Rehabilitation

## 2014-03-17 NOTE — Progress Notes (Signed)
Anesthesia Note:  Patient is a 38 year old female scheduled for right uni knee arthroplasty, medially on 04/19/14 by Dr. Alvan Dame.  Case is currently posted for spinal anesthesia.    History includes depression, anxiety, DM2, HLD, Cushing disease, obesity, hypothyroidism, OSA on CPAP.  She is not scheduled yet for a PAT visit, but Dr. Alvan Dame has requested that she be evaluated by an anesthesiologist when she come in for PAT prior to her actual surgery date.  He would like to do a spinal block, but due to her body habitus would like the anesthesiologist's input regarding the selection for anesthesia.   Dr. Aurea Graff office faxed records to [facility] to anesthesiologist Dr. Tobias Alexander; however, surgery is actually scheduled at [facility].  I called and spoke with nurse Magda Paganini in PAT at [facility] regarding need for anesthesia pre-operative evaluation. I also faxed her the records sent to [phone] with confirmation. I have updated Dr. Tobias Alexander and Venida Jarvis at Dr. Aurea Graff office. She will be further evaluated at [facility].  George Hugh [facility] Short Stay Center/Anesthesiology Phone [phone] 03/17/2014 5:27 PM

## 2014-04-02 ENCOUNTER — Encounter (HOSPITAL_COMMUNITY): Payer: Self-pay | Admitting: Pharmacy Technician

## 2014-04-09 ENCOUNTER — Encounter (HOSPITAL_COMMUNITY)
Admission: RE | Admit: 2014-04-09 | Discharge: 2014-04-09 | Disposition: A | Discharge status: Home / Self Care | Payer: BC Managed Care – PPO | Source: Ambulatory Visit | Attending: Orthopedic Surgery | Admitting: Orthopedic Surgery

## 2014-04-09 ENCOUNTER — Ambulatory Visit (HOSPITAL_COMMUNITY)
Admission: RE | Admit: 2014-04-09 | Discharge: 2014-04-09 | Disposition: A | Discharge status: Home / Self Care | Payer: BC Managed Care – PPO | Source: Ambulatory Visit | Attending: Anesthesiology | Admitting: Anesthesiology

## 2014-04-09 ENCOUNTER — Encounter (HOSPITAL_COMMUNITY): Payer: Self-pay

## 2014-04-09 DIAGNOSIS — I517 Cardiomegaly: Secondary | ICD-10-CM | POA: Insufficient documentation

## 2014-04-09 DIAGNOSIS — Z01818 Encounter for other preprocedural examination: Secondary | ICD-10-CM | POA: Insufficient documentation

## 2014-04-09 HISTORY — DX: Major depressive disorder, single episode, unspecified: F32.9

## 2014-04-09 HISTORY — DX: Sleep apnea, unspecified: G47.30

## 2014-04-09 HISTORY — DX: Anxiety disorder, unspecified: F41.9

## 2014-04-09 HISTORY — DX: Depression, unspecified: F32.A

## 2014-04-09 LAB — BASIC METABOLIC PANEL
ANION GAP: 12 (ref 5–15)
BUN: 14 mg/dL (ref 6–23)
CALCIUM: 10 mg/dL (ref 8.4–10.5)
CO2: 26 mEq/L (ref 19–32)
Chloride: 101 mEq/L (ref 96–112)
Creatinine, Ser: 0.57 mg/dL (ref 0.50–1.10)
GFR calc Af Amer: >90 mL/min (ref >90)
GLUCOSE: 118 mg/dL — AB (ref 70–99)
POTASSIUM: 4.6 meq/L (ref 3.7–5.3)
SODIUM: 139 meq/L (ref 137–147)

## 2014-04-09 LAB — CBC
HCT: 42.7 % (ref 36.0–46.0)
Hemoglobin: 14.1 g/dL (ref 12.0–15.0)
MCH: 28.1 pg (ref 26.0–34.0)
MCHC: 33 g/dL (ref 30.0–36.0)
MCV: 85.1 fL (ref 78.0–100.0)
Platelets: 407 10*3/uL — ABNORMAL HIGH (ref 150–400)
RBC: 5.02 MIL/uL (ref 3.87–5.11)
RDW: 14.4 % (ref 11.5–15.5)
WBC: 9.5 10*3/uL (ref 4.0–10.5)

## 2014-04-09 LAB — URINALYSIS, ROUTINE W REFLEX MICROSCOPIC
Bilirubin Urine: NEGATIVE
Hgb urine dipstick: NEGATIVE
KETONES UR: NEGATIVE mg/dL
NITRITE: NEGATIVE
PROTEIN: 100 mg/dL — AB
Specific Gravity, Urine: 1.031 — ABNORMAL HIGH (ref 1.005–1.030)
Urobilinogen, UA: 0.2 mg/dL (ref 0.0–1.0)
pH: 5.5 (ref 5.0–8.0)

## 2014-04-09 LAB — HCG, SERUM, QUALITATIVE: Preg, Serum: NEGATIVE

## 2014-04-09 LAB — URINE MICROSCOPIC-ADD ON

## 2014-04-09 LAB — SURGICAL PCR SCREEN
MRSA, PCR: NEGATIVE
Staphylococcus aureus: POSITIVE — AB

## 2014-04-09 LAB — APTT: APTT: 30 s (ref 24–37)

## 2014-04-09 LAB — PROTIME-INR
INR: 0.96 (ref 0.00–1.49)
Prothrombin Time: 12.8 seconds (ref 11.6–15.2)

## 2014-04-09 NOTE — Patient Instructions (Addendum)
[facility] [facility]  04/09/2014   Your procedure is scheduled on:   04-19-2014 Monday at 1000 AM.  Enter through [facility] Entrance and follow signs to [facility]. Arrive at      [place]  AM..  Call this number if you have problems the morning of surgery: [phone]  Or Presurgical Testing [phone](Tamisha Nordstrom) For Living Will and/or Health Care Power Attorney Forms: please provide copy for your medical record,may bring AM of surgery(Forms should be already notarized -we do not provide this service).(04-09-14  No information preferred today).   For Cpap use: Bring mask and tubing only.   Do not eat food:After Midnight.   Take these medicines the morning of surgery with A SIP OF WATER: Gabapentin(Nuerontin), Gemfibrozil, Levothyroxine. Effexor(Venlafaxine). Take Novolog insulin(1/2 usual PM dose on 04-18-14)night before surgery. Take no insulin or Diabetic meds AM of.    Do not wear jewelry, make-up or nail polish.  Do not wear lotions, powders, or perfumes. You may wear deodorant.  Do not shave 48 hours(2 days) prior to first CHG shower(legs and under arms).(Shaving face and neck okay.)  Do not bring valuables to the hospital.(Hospital is not responsible for lost valuables).  Contacts, dentures or removable bridgework, body piercing, hair pins may not be worn into surgery.  Leave suitcase in the car. After surgery it may be brought to your room.  For patients admitted to the hospital, checkout time is 11:00 AM the day of discharge.(Restricted visitors-Any Persons displaying flu-like symptoms or illness).    Patients discharged the day of surgery will not be allowed to drive home. Must have responsible person with you x 24 hours once discharged.  Name and phone number of your driver: Katrinka Blazing [phone] cell  Special Instructions: CHG(Chlorhedine 4%-"Hibiclens","Betasept","Aplicare") Shower Use Special Wash: see special instructions.(avoid face and genitals)   Please read  over the following fact sheets that you were given: MRSA Information, Blood Transfusion fact sheet, Incentive Spirometry Instruction.  Remember : Type/Screen "Blue armbands" - may not be removed once applied(would result in being retested AM of surgery, if removed).     ____________________    [facility] - Preparing for Surgery Before surgery, you can play an important role.  Because skin is not sterile, your skin needs to be as free of germs as possible.  You can reduce the number of germs on your skin by washing with CHG (chlorahexidine gluconate) soap before surgery.  CHG is an antiseptic cleaner which kills germs and bonds with the skin to continue killing germs even after washing. Please DO NOT use if you have an allergy to CHG or antibacterial soaps.  If your skin becomes reddened/irritated stop using the CHG and inform your nurse when you arrive at Short Stay. Do not shave (including legs and underarms) for at least 48 hours prior to the first CHG shower.  You may shave your face/neck. Please follow these instructions carefully:  1.  Shower with CHG Soap the night before surgery and the  morning of Surgery.  2.  If you choose to wash your hair, wash your hair first as usual with your  normal  shampoo.  3.  After you shampoo, rinse your hair and body thoroughly to remove the  shampoo.                           4.  Use CHG as you would any other liquid soap.  You can apply chg  directly  to the skin and wash                       Gently with a scrungie or clean washcloth.  5.  Apply the CHG Soap to your body ONLY FROM THE NECK DOWN.   Do not use on face/ open                           Wound or open sores. Avoid contact with eyes, ears mouth and genitals (private parts).                       Wash face,  Genitals (private parts) with your normal soap.             6.  Wash thoroughly, paying special attention to the area where your surgery  will be performed.  7.  Thoroughly rinse your body  with warm water from the neck down.  8.  DO NOT shower/wash with your normal soap after using and rinsing off  the CHG Soap.                9.  Pat yourself dry with a clean towel.            10.  Wear clean pajamas.            11.  Place clean sheets on your bed the night of your first shower and do not  sleep with pets. Day of Surgery : Do not apply any lotions/deodorants the morning of surgery.  Please wear clean clothes to the hospital/surgery center.  FAILURE TO FOLLOW THESE INSTRUCTIONS MAY RESULT IN THE CANCELLATION OF YOUR SURGERY PATIENT SIGNATURE_________________________________  NURSE SIGNATURE__________________________________  ________________________________________________________________________   Adam Phenix  An incentive spirometer is a tool that can help keep your lungs clear and active. This tool measures how well you are filling your lungs with each breath. Taking long deep breaths may help reverse or decrease the chance of developing breathing (pulmonary) problems (especially infection) following:  A long period of time when you are unable to move or be active. BEFORE THE PROCEDURE   If the spirometer includes an indicator to show your best effort, your nurse or respiratory therapist will set it to a desired goal.  If possible, sit up straight or lean slightly forward. Try not to slouch.  Hold the incentive spirometer in an upright position. INSTRUCTIONS FOR USE  1. Sit on the edge of your bed if possible, or sit up as far as you can in bed or on a chair. 2. Hold the incentive spirometer in an upright position. 3. Breathe out normally. 4. Place the mouthpiece in your mouth and seal your lips tightly around it. 5. Breathe in slowly and as deeply as possible, raising the piston or the ball toward the top of the column. 6. Hold your breath for 3-5 seconds or for as long as possible. Allow the piston or ball to fall to the bottom of the column. 7. Remove  the mouthpiece from your mouth and breathe out normally. 8. Rest for a few seconds and repeat Steps 1 through 7 at least 10 times every 1-2 hours when you are awake. Take your time and take a few normal breaths between deep breaths. 9. The spirometer may include an indicator to show your best effort. Use the indicator as a goal to work toward during each repetition.  10. After each set of 10 deep breaths, practice coughing to be sure your lungs are clear. If you have an incision (the cut made at the time of surgery), support your incision when coughing by placing a pillow or rolled up towels firmly against it. Once you are able to get out of bed, walk around indoors and cough well. You may stop using the incentive spirometer when instructed by your caregiver.  RISKS AND COMPLICATIONS  Take your time so you do not get dizzy or light-headed.  If you are in pain, you may need to take or ask for pain medication before doing incentive spirometry. It is harder to take a deep breath if you are having pain. AFTER USE  Rest and breathe slowly and easily.  It can be helpful to keep track of a log of your progress. Your caregiver can provide you with a simple table to help with this. If you are using the spirometer at home, follow these instructions: [place] IF:   You are having difficultly using the spirometer.  You have trouble using the spirometer as often as instructed.  Your pain medication is not giving enough relief while using the spirometer.  You develop fever of 100.5 F (38.1 C) or higher. SEEK IMMEDIATE MEDICAL CARE IF:   You cough up bloody sputum that had not been present before.  You develop fever of 102 F (38.9 C) or greater.  You develop worsening pain at or near the incision site. MAKE SURE YOU:   Understand these instructions.  Will watch your condition.  Will get help right away if you are not doing well or get worse. Document Released: 01/07/2007 Document  Revised: 11/19/2011 Document Reviewed: 03/10/2007 ExitCare Patient Information 2014 ExitCare, [place].   ________________________________________________________________________  WHAT IS A BLOOD TRANSFUSION? Blood Transfusion Information  A transfusion is the replacement of blood or some of its parts. Blood is made up of multiple cells which provide different functions.  Red blood cells carry oxygen and are used for blood loss replacement.  White blood cells fight against infection.  Platelets control bleeding.  Plasma helps clot blood.  Other blood products are available for specialized needs, such as hemophilia or other clotting disorders. BEFORE THE TRANSFUSION  Who gives blood for transfusions?   Healthy volunteers who are fully evaluated to make sure their blood is safe. This is blood bank blood. Transfusion therapy is the safest it has ever been in the practice of medicine. Before blood is taken from a donor, a complete history is taken to make sure that person has no history of diseases nor engages in risky social behavior (examples are intravenous drug use or sexual activity with multiple partners). The donor's travel history is screened to minimize risk of transmitting infections, such as malaria. The donated blood is tested for signs of infectious diseases, such as HIV and hepatitis. The blood is then tested to be sure it is compatible with you in order to minimize the chance of a transfusion reaction. If you or a relative donates blood, this is often done in anticipation of surgery and is not appropriate for emergency situations. It takes many days to process the donated blood. RISKS AND COMPLICATIONS Although transfusion therapy is very safe and saves many lives, the main dangers of transfusion include:   Getting an infectious disease.  Developing a transfusion reaction. This is an allergic reaction to something in the blood you were given. Every precaution is taken to prevent  this. The  decision to have a blood transfusion has been considered carefully by your caregiver before blood is given. Blood is not given unless the benefits outweigh the risks. AFTER THE TRANSFUSION  Right after receiving a blood transfusion, you will usually feel much better and more energetic. This is especially true if your red blood cells have gotten low (anemic). The transfusion raises the level of the red blood cells which carry oxygen, and this usually causes an energy increase.  The nurse administering the transfusion will monitor you carefully for complications. HOME CARE INSTRUCTIONS  No special instructions are needed after a transfusion. You may find your energy is better. Speak with your caregiver about any limitations on activity for underlying diseases you may have. SEEK MEDICAL CARE IF:   Your condition is not improving after your transfusion.  You develop redness or irritation at the intravenous (IV) site. SEEK IMMEDIATE MEDICAL CARE IF:  Any of the following symptoms occur over the next 12 hours:  Shaking chills.  You have a temperature by mouth above 102 F (38.9 C), not controlled by medicine.  Chest, back, or muscle pain.  People around you feel you are not acting correctly or are confused.  Shortness of breath or difficulty breathing.  Dizziness and fainting.  You get a rash or develop hives.  You have a decrease in urine output.  Your urine turns a dark color or changes to pink, red, or brown. Any of the following symptoms occur over the next 10 days:  You have a temperature by mouth above 102 F (38.9 C), not controlled by medicine.  Shortness of breath.  Weakness after normal activity.  The white part of the eye turns yellow (jaundice).  You have a decrease in the amount of urine or are urinating less often.  Your urine turns a dark color or changes to pink, red, or brown. Document Released: 08/24/2000 Document Revised: 11/19/2011 Document  Reviewed: 04/12/2008 [facility] Patient Information 2014 [place], [place].  _______________________________________________________________________

## 2014-04-09 NOTE — Pre-Procedure Instructions (Signed)
04-09-14 EKG/ CXR done today. Dr. Kalman Shan in to visit for preop anesthesia consult- questions answered, spinal anesthesia planned.

## 2014-04-09 NOTE — Pre-Procedure Instructions (Addendum)
04-09-14 1500 Labs viewable in Epic, note urinalysis. 04-09-14 150 Note to Dr. Aurea Graff office to note urinalysis and labs are viewable in Epic.

## 2014-04-09 NOTE — Anesthesia Preprocedure Evaluation (Signed)
Anesthesia Evaluation  Patient identified by MRN, date of birth, ID band Patient awake    Reviewed: Allergy & Precautions, H&P , NPO status , Patient's Chart, lab work & pertinent test results  Airway Mallampati: III TM Distance: <3 FB Neck ROM: Full    Dental no notable dental hx.    Pulmonary sleep apnea , former smoker,  breath sounds clear to auscultation  Pulmonary exam normal       Cardiovascular negative cardio ROS  Rhythm:Regular Rate:Normal     Neuro/Psych Anxiety negative neurological ROS     GI/Hepatic negative GI ROS, Neg liver ROS,   Endo/Other  diabetes, Insulin DependentHypothyroidism   Renal/GU negative Renal ROS  negative genitourinary   Musculoskeletal negative musculoskeletal ROS (+)   Abdominal   Peds negative pediatric ROS (+)  Hematology negative hematology ROS (+)   Anesthesia Other Findings   Reproductive/Obstetrics negative OB ROS                           Anesthesia Physical Anesthesia Plan  ASA: III  Anesthesia Plan: Spinal   Post-op Pain Management:    Induction: Intravenous  Airway Management Planned: Simple Face Mask  Additional Equipment:   Intra-op Plan:   Post-operative Plan:   Informed Consent: I have reviewed the patients History and Physical, chart, labs and discussed the procedure including the risks, benefits and alternatives for the proposed anesthesia with the patient or authorized representative who has indicated his/her understanding and acceptance.     Plan Discussed with: CRNA and Surgeon  Anesthesia Plan Comments:         Anesthesia Quick Evaluation

## 2014-04-12 NOTE — Pre-Procedure Instructions (Addendum)
04-12-14 Patient PCR screen Positive for Staph aureus-RX will be [facility] [phone] today, pt. Has been made aware, to uses as directed. 04-12-14 9450 Rx for Mupirocin Ointment called to [facility] [phone] spoke with "Colletta Maryland" pharmacist.

## 2014-04-14 NOTE — H&P (Signed)
UNICOMPARTMENTAL KNEE ADMISSION H&P  Patient is being admitted for right medial unicompartmental knee arthroplasty.  Subjective:  Chief Complaint:   Right knee medial compartment OA / pain.  HPI: [name], 38 y.o. female, has a history of pain and functional disability in the right knee due to arthritis and has failed non-surgical conservative treatments for greater than 12 weeks to include NSAID's and/or analgesics, corticosteriod injections, viscosupplementation injections, use of assistive devices and activity modification.  Onset of symptoms was gradual, starting >10 years ago with gradually worsening course since that time. The patient noted no past surgery on the right knee(s).  Patient currently rates pain in the right knee(s) at 8 out of 10 with activity. Patient has night pain, worsening of pain with activity and weight bearing, pain that interferes with activities of daily living, pain with passive range of motion, crepitus and joint swelling.  Patient has evidence of periarticular osteophytes and joint space narrowing of the medial compartment by imaging studies. There is no active infection.  Risks, benefits and expectations were discussed with the patient.  Risks including but not limited to the risk of anesthesia, blood clots, nerve damage, blood vessel damage, failure of the prosthesis, infection and up to and including death.  Patient understand the risks, benefits and expectations and wishes to proceed with surgery.   PCP: Annye Asa, MD  D/C Plans:      Home with HHPT  Post-op Meds:       No Rx given  Tranexamic Acid:      To be given - IV    Decadron:      Is to be given  FYI:     ASA post-op  Norco post-op   Patient Active Problem List   Diagnosis Date Noted  . Shoulder pain 07/31/2012  . DM type 2 with diabetic peripheral neuropathy 07/31/2012  . Hyperlipidemia 07/31/2012  . Cushing disease 07/31/2012  . Hypothyroid 07/31/2012  . Major depressive  disorder, recurrent episode, mild 04/08/2012  . Generalized anxiety disorder 10/01/2011   Past Medical History  Diagnosis Date  . Diabetes mellitus type II   . High cholesterol   . Lower leg, muscle or tendon injury   . 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 Cushing disease     pt reports cushing was ruled out, but still has s/s  . Sleep apnea     cpap used since 4'06- automatic settings  . Anxiety   . Depression   . Abdominal wall hernia     mid-abdomen  . Foot fracture, left     "stress fracture"-wears boot    Past Surgical History  Procedure Laterality Date  . Cesarean section      x3   . Cholecystectomy      gallstones    No prescriptions prior to admission   Allergies  Allergen Reactions  . Penicillins Hives    History  Substance Use Topics  . Smoking status: Former Smoker    Quit date: 07/25/1999  . Smokeless tobacco: Never Used  . Alcohol Use: Yes     Comment: occasional use at holidays and with family get togethers- every couple of months    Family History  Problem Relation Age of Onset  . Adopted: Yes  . Autism Son   . ADD / ADHD Son   . Apraxia Son      Review of Systems  Constitutional: Negative.   HENT: Negative.  Eyes: Negative.   Respiratory: Negative.   Cardiovascular: Negative.   Gastrointestinal: Negative.   Genitourinary: Negative.   Musculoskeletal: Positive for joint pain.  Skin: Negative.   Neurological: Negative.   Endo/Heme/Allergies: Negative.   Psychiatric/Behavioral: Positive for depression. The patient is nervous/anxious.     Objective:  Physical Exam  Constitutional: She is oriented to person, place, and time. She appears well-nourished.  HENT:  Head: Normocephalic and atraumatic.  Eyes: Pupils are equal, round, and reactive to light.  Neck: Neck supple. No tracheal deviation present.  Cardiovascular: Normal rate, regular rhythm, normal heart sounds and intact distal pulses.    Respiratory: Effort normal and breath sounds normal. No respiratory distress. She has no wheezes.  GI: Soft. There is no tenderness. There is no guarding.  Musculoskeletal:       Right knee: She exhibits decreased range of motion, swelling and bony tenderness. She exhibits no ecchymosis, no deformity, no laceration and no erythema. Tenderness found. Medial joint line tenderness noted. No lateral joint line tenderness noted.  Neurological: She is alert and oriented to person, place, and time.  Skin: Skin is warm and dry.  Psychiatric: She has a normal mood and affect.     Imaging Review Plain radiographs demonstrate severe degenerative joint disease of the right knee, medial compartment. The overall alignment is neutral. The bone quality appears to be good for age and reported activity level.  Assessment/Plan:  End stage arthritis, left knee, medial compartment  The patient history, physical examination, clinical judgment of the provider and imaging studies are consistent with end stage degenerative joint disease of the right knee(s) and total knee arthroplasty is deemed medically necessary. The treatment options including medical management, injection therapy arthroscopy and arthroplasty were discussed at length. The risks and benefits of total knee arthroplasty were presented and reviewed. The risks due to aseptic loosening, infection, stiffness, patella tracking problems, thromboembolic complications and other imponderables were discussed. The patient acknowledged the explanation, agreed to proceed with the plan and consent was signed. Patient is being admitted for inpatient treatment for surgery, pain control, PT, OT, prophylactic antibiotics, VTE prophylaxis, progressive ambulation and ADL's and discharge planning. The patient is planning to be discharged home with home health services.    West Pugh Brunetta Newingham   PA-C  04/14/2014, 2:47 PM

## 2014-04-16 LAB — LIPID PANEL
Cholesterol, Total: 240 mg/dL — ABNORMAL HIGH (ref 0–199)
HDL: 49 mg/dL (ref 40–60)
LDL Calculated: 157 mg/dL — ABNORMAL HIGH (ref <100)
Triglycerides: 168 mg/dL — ABNORMAL HIGH (ref 0–150)
VLDL Cholesterol Calculated: 34 mg/dL

## 2014-04-16 LAB — T4, FREE: T4 Free: 1.1 ng/dL (ref 0.9–1.8)

## 2014-04-16 NOTE — Progress Notes (Signed)
Subjective:      Patient ID: [name] [name] is a 38 y.o. female.    HPI  Polycystic Ovarian Syndrome: diagnose by pelvic ultrasound and labs on Metformin 1g bid. She was placed on BCP 3 years ago but not interested in being on that due to her husband already had a vasectomy. No problems with metformin 1g bid. Not much change in facial hair growth but her period is now regular and she is now able to lose weight.   Hirsutism: not much of a difference with the spironolactone 25 mg qd.   Hypothyroidism: Recent symptoms: improve fatigue but still tired on synthroid 100 mcg qd. She denies fatigue, weight gain, cold intolerance and constipation, weight loss, heat intolerance, hair loss, dry skin, diarrhea, edema, anxiety, tremor, palpitations and dysphagia. Patient is going taking her medication consistently on an empty stomach.   Hyperlipidemia: Patient is following a low fat, low cholesterol diet. She is not exercising regularly. OTC Supplements: none.   Eyes have been twitching for the past 3 days.    No results found for this basename: LABA1C, LABMICR     Lab Results   Component Value Date    NA 140 08/05/2013    K 4.1 08/05/2013    CL 100 08/05/2013    CO2 23 08/05/2013    BUN 12 08/05/2013    CREATININE 0.6 08/05/2013    GLUCOSE 84 08/05/2013    CALCIUM 9.6 08/05/2013     Lab Results   Component Value Date    CHOL 235 08/05/2013    TRIG 210 08/05/2013    HDL 45 08/05/2013    LDLCALC 148 08/05/2013     Lab Results   Component Value Date    ALT 15 08/05/2013    AST 14* 08/05/2013     Lab Results   Component Value Date    TSH 1.12 02/05/2013    TSH 2.00 11/01/2012    T4FREE 1.1 12/22/2013    T4FREE 1.2 08/05/2013     Lab Results   Component Value Date    WBC 8.9 08/05/2013    HGB 12.2 08/05/2013    HCT 37.7 08/05/2013    MCV 81.9 08/05/2013    PLT 407 08/05/2013     No results found for this basename: INR      No results found for this basename: PSA      No results found for this basename: LABURIC          Review of Systems   was negative except of what was stated on HPI      Objective:   Physical Exam  Nursing note and vitals reviewed.    Filed Vitals:    04/16/14 0932   BP: 110/70   Pulse: 76   Temp: 99.6 ??F (37.6 ??C)   TempSrc: Oral   Resp: 18   Height: 5\' 8"  (1.727 m)   Weight: 260 lb (117.935 kg)     Wt Readings from Last 3 Encounters:   04/16/14 260 lb (117.935 kg)   12/22/13 266 lb (120.657 kg)   08/05/13 259 lb 9.6 oz (117.754 kg)     BP Readings from Last 3 Encounters:   04/16/14 110/70   12/22/13 126/80   08/05/13 110/74     Body mass index is 39.54 kg/(m^2).  Constitutional: Patient appears well-developed and well-nourished. No distress.   Head: Normocephalic and atraumatic.   Neck: Normal range of motion. Neck supple. No thyroidmegaly.   Cardiovascular: Normal rate, regular  rhythm, normal heart sounds and intact distal pulses.   Pulmonary/Chest: Effort normal and breath sounds normal. No stridor. No respiratory distress. No wheezes and no rales.   Abdominal: Soft. Bowel sounds are normal. No distension and no mass. No tenderness. No rebound and no guarding.   Musculoskeletal: No edema and no tenderness.   Skin: No rash or erythema.  Psychiatric: Normal mood and affect. Behavior is normal.       Assessment:      Shritha was seen today for follow-up.    Diagnoses and associated orders for this visit:    Thyroid activity decreased  - T4, free    Hyperlipidemia  - Lipid Panel    Polycystic ovarian syndrome             Plan:      Follow up for a Fasting Physical Nov

## 2014-04-16 NOTE — Patient Instructions (Signed)
Fasting Physical Nov

## 2014-04-18 ENCOUNTER — Encounter

## 2014-04-18 MED ORDER — LEVOTHYROXINE SODIUM 150 MCG PO TABS
150 MCG | ORAL_TABLET | Freq: Every day | ORAL | Status: DC
Start: 2014-04-18 — End: 2014-07-29

## 2014-04-19 ENCOUNTER — Observation Stay (HOSPITAL_COMMUNITY)
Admission: RE | Admit: 2014-04-19 | Discharge: 2014-04-20 | Disposition: A | Discharge status: Home / Self Care | Payer: BC Managed Care – PPO | Source: Ambulatory Visit | Attending: Orthopedic Surgery | Admitting: Orthopedic Surgery

## 2014-04-19 ENCOUNTER — Encounter (HOSPITAL_COMMUNITY): Payer: BC Managed Care – PPO | Admitting: Vascular Surgery

## 2014-04-19 ENCOUNTER — Encounter (HOSPITAL_COMMUNITY): Payer: Self-pay | Admitting: *Deleted

## 2014-04-19 ENCOUNTER — Encounter (HOSPITAL_COMMUNITY)
Admission: RE | Disposition: A | Discharge status: Home / Self Care | Payer: Self-pay | Source: Ambulatory Visit | Attending: Orthopedic Surgery

## 2014-04-19 ENCOUNTER — Ambulatory Visit (HOSPITAL_COMMUNITY): Payer: BC Managed Care – PPO | Admitting: Vascular Surgery

## 2014-04-19 DIAGNOSIS — G4730 Sleep apnea, unspecified: Secondary | ICD-10-CM | POA: Diagnosis not present

## 2014-04-19 DIAGNOSIS — E249 Cushing's syndrome, unspecified: Secondary | ICD-10-CM | POA: Diagnosis not present

## 2014-04-19 DIAGNOSIS — Z9089 Acquired absence of other organs: Secondary | ICD-10-CM | POA: Diagnosis not present

## 2014-04-19 DIAGNOSIS — Z794 Long term (current) use of insulin: Secondary | ICD-10-CM | POA: Insufficient documentation

## 2014-04-19 DIAGNOSIS — M1710 Unilateral primary osteoarthritis, unspecified knee: Secondary | ICD-10-CM | POA: Diagnosis present

## 2014-04-19 DIAGNOSIS — E1149 Type 2 diabetes mellitus with other diabetic neurological complication: Secondary | ICD-10-CM | POA: Diagnosis not present

## 2014-04-19 DIAGNOSIS — E1142 Type 2 diabetes mellitus with diabetic polyneuropathy: Secondary | ICD-10-CM | POA: Diagnosis not present

## 2014-04-19 DIAGNOSIS — Z87891 Personal history of nicotine dependence: Secondary | ICD-10-CM | POA: Diagnosis not present

## 2014-04-19 DIAGNOSIS — E039 Hypothyroidism, unspecified: Secondary | ICD-10-CM | POA: Insufficient documentation

## 2014-04-19 DIAGNOSIS — E785 Hyperlipidemia, unspecified: Secondary | ICD-10-CM | POA: Insufficient documentation

## 2014-04-19 DIAGNOSIS — E662 Morbid (severe) obesity with alveolar hypoventilation: Secondary | ICD-10-CM

## 2014-04-19 DIAGNOSIS — Z96651 Presence of right artificial knee joint: Secondary | ICD-10-CM

## 2014-04-19 HISTORY — PX: PARTIAL KNEE ARTHROPLASTY: SHX2174

## 2014-04-19 LAB — GLUCOSE, CAPILLARY
GLUCOSE-CAPILLARY: 186 mg/dL — AB (ref 70–99)
GLUCOSE-CAPILLARY: 226 mg/dL — AB (ref 70–99)
GLUCOSE-CAPILLARY: 208 mg/dL — AB (ref 70–99)
Glucose-Capillary: 205 mg/dL — ABNORMAL HIGH (ref 70–99)

## 2014-04-19 LAB — ABO/RH: ABO/RH(D): O POS

## 2014-04-19 LAB — TYPE AND SCREEN
ABO/RH(D): O POS
ANTIBODY SCREEN: NEGATIVE

## 2014-04-19 SURGERY — ARTHROPLASTY, KNEE, UNICOMPARTMENTAL
Anesthesia: Spinal | Site: Knee | Laterality: Right

## 2014-04-19 MED ORDER — BUPIVACAINE-EPINEPHRINE (PF) 0.25% -1:200000 IJ SOLN
INTRAMUSCULAR | Status: AC
  Filled 2014-04-19: qty 30

## 2014-04-19 MED ORDER — CLINDAMYCIN PHOSPHATE 600 MG/50ML IV SOLN
600.0000 mg | Freq: Four times a day (QID) | INTRAVENOUS | Status: AC
  Administered 2014-04-19 (×2): 600 mg via INTRAVENOUS
  Filled 2014-04-19 (×2): qty 50

## 2014-04-19 MED ORDER — PROPOFOL 10 MG/ML IV BOLUS
INTRAVENOUS | Status: AC
  Filled 2014-04-19: qty 20

## 2014-04-19 MED ORDER — METHOCARBAMOL 500 MG PO TABS: 500.0000 mg | ORAL_TABLET | Freq: Four times a day (QID) | ORAL | Status: DC | PRN

## 2014-04-19 MED ORDER — BUPIVACAINE-EPINEPHRINE 0.25% -1:200000 IJ SOLN
INTRAMUSCULAR | Status: DC | PRN
  Administered 2014-04-19: 30 mL

## 2014-04-19 MED ORDER — CLINDAMYCIN PHOSPHATE 900 MG/50ML IV SOLN
INTRAVENOUS | Status: AC
  Filled 2014-04-19: qty 50

## 2014-04-19 MED ORDER — GABAPENTIN 300 MG PO CAPS
600.0000 mg | ORAL_CAPSULE | Freq: Two times a day (BID) | ORAL | Status: DC
  Administered 2014-04-19 – 2014-04-20 (×2): 600 mg via ORAL
  Filled 2014-04-19 (×3): qty 2

## 2014-04-19 MED ORDER — MIDAZOLAM HCL 2 MG/2ML IJ SOLN
INTRAMUSCULAR | Status: AC
  Filled 2014-04-19: qty 2

## 2014-04-19 MED ORDER — HYDROMORPHONE HCL PF 1 MG/ML IJ SOLN
INTRAMUSCULAR | Status: AC
  Filled 2014-04-19: qty 1

## 2014-04-19 MED ORDER — BUPIVACAINE IN DEXTROSE 0.75-8.25 % IT SOLN
INTRATHECAL | Status: DC | PRN
  Administered 2014-04-19: 1.5 mL via INTRATHECAL

## 2014-04-19 MED ORDER — METOCLOPRAMIDE HCL 5 MG/ML IJ SOLN: 5.0000 mg | Freq: Three times a day (TID) | INTRAMUSCULAR | Status: DC | PRN

## 2014-04-19 MED ORDER — ONDANSETRON HCL 4 MG/2ML IJ SOLN
INTRAMUSCULAR | Status: AC
  Filled 2014-04-19: qty 2

## 2014-04-19 MED ORDER — ONDANSETRON HCL 4 MG PO TABS: 4.0000 mg | ORAL_TABLET | Freq: Four times a day (QID) | ORAL | Status: DC | PRN

## 2014-04-19 MED ORDER — CANAGLIFLOZIN 300 MG PO TABS
150.0000 mg | ORAL_TABLET | Freq: Two times a day (BID) | ORAL | Status: DC
  Administered 2014-04-19 – 2014-04-20 (×2): 150 mg via ORAL
  Filled 2014-04-19 (×4): qty 1

## 2014-04-19 MED ORDER — ONDANSETRON HCL 4 MG/2ML IJ SOLN
INTRAMUSCULAR | Status: DC | PRN
  Administered 2014-04-19: 4 mg via INTRAVENOUS

## 2014-04-19 MED ORDER — DIPHENHYDRAMINE HCL 25 MG PO CAPS: 25.0000 mg | ORAL_CAPSULE | Freq: Four times a day (QID) | ORAL | Status: DC | PRN

## 2014-04-19 MED ORDER — LEVOTHYROXINE SODIUM 50 MCG PO TABS
50.0000 ug | ORAL_TABLET | Freq: Every morning | ORAL | Status: DC
  Administered 2014-04-20: 50 ug via ORAL
  Filled 2014-04-19: qty 1

## 2014-04-19 MED ORDER — MAGNESIUM CITRATE PO SOLN: 1.0000 | Freq: Once | ORAL | Status: AC | PRN

## 2014-04-19 MED ORDER — TRANEXAMIC ACID 100 MG/ML IV SOLN
1000.0000 mg | Freq: Once | INTRAVENOUS | Status: AC
  Administered 2014-04-19: 1000 mg via INTRAVENOUS
  Filled 2014-04-19: qty 10

## 2014-04-19 MED ORDER — MEPERIDINE HCL 50 MG/ML IJ SOLN: 6.2500 mg | INTRAMUSCULAR | Status: DC | PRN

## 2014-04-19 MED ORDER — ZOLPIDEM TARTRATE 5 MG PO TABS: 5.0000 mg | ORAL_TABLET | Freq: Every evening | ORAL | Status: DC | PRN

## 2014-04-19 MED ORDER — ALUM & MAG HYDROXIDE-SIMETH 200-200-20 MG/5ML PO SUSP: 30.0000 mL | ORAL | Status: DC | PRN

## 2014-04-19 MED ORDER — HYDROXYZINE PAMOATE 50 MG PO CAPS
50.0000 mg | ORAL_CAPSULE | Freq: Two times a day (BID) | ORAL | Status: DC
  Administered 2014-04-19 (×2): 50 mg via ORAL
  Filled 2014-04-19 (×4): qty 1

## 2014-04-19 MED ORDER — LISINOPRIL 5 MG PO TABS
5.0000 mg | ORAL_TABLET | Freq: Every day | ORAL | Status: DC
  Administered 2014-04-19: 5 mg via ORAL
  Filled 2014-04-19 (×2): qty 1

## 2014-04-19 MED ORDER — METFORMIN HCL 500 MG PO TABS
1000.0000 mg | ORAL_TABLET | Freq: Two times a day (BID) | ORAL | Status: DC
  Administered 2014-04-19 – 2014-04-20 (×2): 1000 mg via ORAL
  Filled 2014-04-19 (×4): qty 2

## 2014-04-19 MED ORDER — DOCUSATE SODIUM 100 MG PO CAPS
100.0000 mg | ORAL_CAPSULE | Freq: Two times a day (BID) | ORAL | Status: DC
  Administered 2014-04-19 – 2014-04-20 (×3): 100 mg via ORAL

## 2014-04-19 MED ORDER — CELECOXIB 200 MG PO CAPS
200.0000 mg | ORAL_CAPSULE | Freq: Two times a day (BID) | ORAL | Status: DC
  Administered 2014-04-19 (×2): 200 mg via ORAL
  Filled 2014-04-19 (×4): qty 1

## 2014-04-19 MED ORDER — VENLAFAXINE HCL ER 37.5 MG PO CP24
37.5000 mg | ORAL_CAPSULE | Freq: Two times a day (BID) | ORAL | Status: DC
  Administered 2014-04-19 – 2014-04-20 (×2): 37.5 mg via ORAL
  Filled 2014-04-19 (×3): qty 1

## 2014-04-19 MED ORDER — MIDAZOLAM HCL 5 MG/5ML IJ SOLN
INTRAMUSCULAR | Status: DC | PRN
  Administered 2014-04-19: 2 mg via INTRAVENOUS

## 2014-04-19 MED ORDER — CLINDAMYCIN PHOSPHATE 900 MG/50ML IV SOLN
900.0000 mg | INTRAVENOUS | Status: AC
  Administered 2014-04-19: 900 mg via INTRAVENOUS

## 2014-04-19 MED ORDER — PROMETHAZINE HCL 25 MG/ML IJ SOLN: 6.2500 mg | INTRAMUSCULAR | Status: DC | PRN

## 2014-04-19 MED ORDER — GEMFIBROZIL 600 MG PO TABS
600.0000 mg | ORAL_TABLET | Freq: Two times a day (BID) | ORAL | Status: DC
  Administered 2014-04-19 – 2014-04-20 (×2): 600 mg via ORAL
  Filled 2014-04-19 (×4): qty 1

## 2014-04-19 MED ORDER — SODIUM CHLORIDE 0.9 % IJ SOLN
INTRAMUSCULAR | Status: AC
  Filled 2014-04-19: qty 10

## 2014-04-19 MED ORDER — ALBUTEROL SULFATE (2.5 MG/3ML) 0.083% IN NEBU
2.5000 mg | INHALATION_SOLUTION | Freq: Two times a day (BID) | RESPIRATORY_TRACT | Status: DC
  Administered 2014-04-19 – 2014-04-20 (×2): 2.5 mg via RESPIRATORY_TRACT
  Filled 2014-04-19 (×2): qty 3

## 2014-04-19 MED ORDER — DEXAMETHASONE SODIUM PHOSPHATE 10 MG/ML IJ SOLN
10.0000 mg | Freq: Once | INTRAMUSCULAR | Status: AC
  Administered 2014-04-19: 10 mg via INTRAVENOUS

## 2014-04-19 MED ORDER — CANAGLIFLOZIN-METFORMIN HCL 150-1000 MG PO TABS: 1.0000 | ORAL_TABLET | Freq: Two times a day (BID) | ORAL | Status: DC

## 2014-04-19 MED ORDER — DEXAMETHASONE SODIUM PHOSPHATE 10 MG/ML IJ SOLN
10.0000 mg | Freq: Once | INTRAMUSCULAR | Status: AC
Start: 2014-04-20 — End: 2014-04-20
  Administered 2014-04-20: 10 mg via INTRAVENOUS
  Filled 2014-04-19: qty 1

## 2014-04-19 MED ORDER — LACTATED RINGERS IV SOLN: INTRAVENOUS | Status: DC

## 2014-04-19 MED ORDER — 0.9 % SODIUM CHLORIDE (POUR BTL) OPTIME
TOPICAL | Status: DC | PRN
  Administered 2014-04-19: 1000 mL

## 2014-04-19 MED ORDER — HYDROMORPHONE HCL PF 1 MG/ML IJ SOLN
0.2500 mg | INTRAMUSCULAR | Status: DC | PRN
  Administered 2014-04-19 (×2): 0.5 mg via INTRAVENOUS

## 2014-04-19 MED ORDER — METOCLOPRAMIDE HCL 10 MG PO TABS: 5.0000 mg | ORAL_TABLET | Freq: Three times a day (TID) | ORAL | Status: DC | PRN

## 2014-04-19 MED ORDER — SODIUM CHLORIDE 0.9 % IV SOLN
INTRAVENOUS | Status: DC
  Administered 2014-04-19 (×2): via INTRAVENOUS
  Filled 2014-04-19 (×9): qty 1000

## 2014-04-19 MED ORDER — INSULIN ASPART PROT & ASPART (70-30 MIX) 100 UNIT/ML ~~LOC~~ SUSP
80.0000 [IU] | Freq: Two times a day (BID) | SUBCUTANEOUS | Status: DC
  Administered 2014-04-19 – 2014-04-20 (×3): 80 [IU] via SUBCUTANEOUS
  Filled 2014-04-19: qty 10

## 2014-04-19 MED ORDER — DEXTROSE 5 % IV SOLN
500.0000 mg | Freq: Four times a day (QID) | INTRAVENOUS | Status: DC | PRN
  Administered 2014-04-19: 500 mg via INTRAVENOUS
  Filled 2014-04-19: qty 5

## 2014-04-19 MED ORDER — ONDANSETRON HCL 4 MG/2ML IJ SOLN: 4.0000 mg | Freq: Four times a day (QID) | INTRAMUSCULAR | Status: DC | PRN

## 2014-04-19 MED ORDER — POLYETHYLENE GLYCOL 3350 17 G PO PACK
17.0000 g | PACK | Freq: Two times a day (BID) | ORAL | Status: DC
  Administered 2014-04-19 (×2): 17 g via ORAL

## 2014-04-19 MED ORDER — SODIUM CHLORIDE 0.9 % IJ SOLN
INTRAMUSCULAR | Status: DC | PRN
  Administered 2014-04-19: 9 mL

## 2014-04-19 MED ORDER — DEXAMETHASONE SODIUM PHOSPHATE 10 MG/ML IJ SOLN
INTRAMUSCULAR | Status: AC
  Filled 2014-04-19: qty 1

## 2014-04-19 MED ORDER — FENTANYL CITRATE 0.05 MG/ML IJ SOLN
INTRAMUSCULAR | Status: DC | PRN
  Administered 2014-04-19 (×2): 50 ug via INTRAVENOUS

## 2014-04-19 MED ORDER — HYDROCODONE-ACETAMINOPHEN 7.5-325 MG PO TABS
1.0000 | ORAL_TABLET | ORAL | Status: DC
  Administered 2014-04-19: 2 via ORAL
  Administered 2014-04-19: 1 via ORAL
  Administered 2014-04-19 – 2014-04-20 (×2): 2 via ORAL
  Administered 2014-04-20 (×2): 1 via ORAL
  Filled 2014-04-19 (×2): qty 2
  Filled 2014-04-19 (×2): qty 1
  Filled 2014-04-19: qty 2
  Filled 2014-04-19: qty 1

## 2014-04-19 MED ORDER — CHLORHEXIDINE GLUCONATE 4 % EX LIQD: 60.0000 mL | Freq: Once | CUTANEOUS | Status: DC

## 2014-04-19 MED ORDER — ASPIRIN EC 325 MG PO TBEC
325.0000 mg | DELAYED_RELEASE_TABLET | Freq: Two times a day (BID) | ORAL | Status: DC
  Administered 2014-04-20: 325 mg via ORAL
  Filled 2014-04-19 (×3): qty 1

## 2014-04-19 MED ORDER — KETOROLAC TROMETHAMINE 30 MG/ML IJ SOLN
INTRAMUSCULAR | Status: DC | PRN
  Administered 2014-04-19: 30 mg

## 2014-04-19 MED ORDER — BUPIVACAINE LIPOSOME 1.3 % IJ SUSP
20.0000 mL | Freq: Once | INTRAMUSCULAR | Status: AC
  Administered 2014-04-19: 20 mL
  Filled 2014-04-19: qty 20

## 2014-04-19 MED ORDER — MENTHOL 3 MG MT LOZG
1.0000 | LOZENGE | OROMUCOSAL | Status: DC | PRN
  Filled 2014-04-19: qty 9

## 2014-04-19 MED ORDER — PROPOFOL INFUSION 10 MG/ML OPTIME
INTRAVENOUS | Status: DC | PRN
  Administered 2014-04-19: 140 ug/kg/min via INTRAVENOUS

## 2014-04-19 MED ORDER — HYDROMORPHONE HCL PF 1 MG/ML IJ SOLN: 0.5000 mg | INTRAMUSCULAR | Status: DC | PRN

## 2014-04-19 MED ORDER — FENTANYL CITRATE 0.05 MG/ML IJ SOLN: 25.0000 ug | INTRAMUSCULAR | Status: DC | PRN

## 2014-04-19 MED ORDER — LACTATED RINGERS IV SOLN
INTRAVENOUS | Status: DC
  Administered 2014-04-19: 10:00:00 via INTRAVENOUS
  Administered 2014-04-19: 1000 mL via INTRAVENOUS

## 2014-04-19 MED ORDER — PIOGLITAZONE HCL 45 MG PO TABS
45.0000 mg | ORAL_TABLET | Freq: Every morning | ORAL | Status: DC
  Administered 2014-04-19 – 2014-04-20 (×2): 45 mg via ORAL
  Filled 2014-04-19 (×2): qty 1

## 2014-04-19 MED ORDER — PHENOL 1.4 % MT LIQD
1.0000 | OROMUCOSAL | Status: DC | PRN
  Filled 2014-04-19: qty 177

## 2014-04-19 MED ORDER — KETOROLAC TROMETHAMINE 30 MG/ML IJ SOLN
INTRAMUSCULAR | Status: AC
  Filled 2014-04-19: qty 1

## 2014-04-19 MED ORDER — FERROUS SULFATE 325 (65 FE) MG PO TABS
325.0000 mg | ORAL_TABLET | Freq: Three times a day (TID) | ORAL | Status: DC
  Administered 2014-04-19 – 2014-04-20 (×3): 325 mg via ORAL
  Filled 2014-04-19 (×6): qty 1

## 2014-04-19 MED ORDER — FENTANYL CITRATE 0.05 MG/ML IJ SOLN
INTRAMUSCULAR | Status: AC
  Filled 2014-04-19: qty 2

## 2014-04-19 MED ORDER — BISACODYL 10 MG RE SUPP: 10.0000 mg | Freq: Every day | RECTAL | Status: DC | PRN

## 2014-04-19 SURGICAL SUPPLY — 61 items
ADH SKN CLS APL DERMABOND .7 (GAUZE/BANDAGES/DRESSINGS) ×1
BAG SPEC THK2 15X12 ZIP CLS (MISCELLANEOUS) ×1
BAG ZIPLOCK 12X15 (MISCELLANEOUS) ×2 IMPLANT
BANDAGE ELASTIC 6 VELCRO ST LF (GAUZE/BANDAGES/DRESSINGS) ×3 IMPLANT
BANDAGE ESMARK 6X9 LF (GAUZE/BANDAGES/DRESSINGS) ×1 IMPLANT
BLADE SAW RECIPROCATING 77.5 (BLADE) ×3 IMPLANT
BLADE SAW SGTL 13.0X1.19X90.0M (BLADE) ×3 IMPLANT
BNDG CMPR 9X6 STRL LF SNTH (GAUZE/BANDAGES/DRESSINGS) ×1
BNDG ESMARK 6X9 LF (GAUZE/BANDAGES/DRESSINGS) ×3
BOWL SMART MIX CTS (DISPOSABLE) ×3 IMPLANT
CAPT KNEE OXFORD ×2 IMPLANT
CEMENT HV SMART SET (Cement) ×3 IMPLANT
CUFF TOURN SGL QUICK 34 (TOURNIQUET CUFF)
CUFF TOURN SGL QUICK 44 (TOURNIQUET CUFF) ×2 IMPLANT
CUFF TRNQT CYL 34X4X40X1 (TOURNIQUET CUFF) ×1 IMPLANT
DERMABOND ADVANCED (GAUZE/BANDAGES/DRESSINGS) ×2
DERMABOND ADVANCED .7 DNX12 (GAUZE/BANDAGES/DRESSINGS) ×1 IMPLANT
DRAPE EXTREMITY T 121X128X90 (DRAPE) ×3 IMPLANT
DRAPE POUCH INSTRU U-SHP 10X18 (DRAPES) ×3 IMPLANT
DRAPE U-SHAPE 47X51 STRL (DRAPES) ×3 IMPLANT
DRSG AQUACEL AG ADV 3.5X10 (GAUZE/BANDAGES/DRESSINGS) ×3 IMPLANT
DRSG TEGADERM 4X4.75 (GAUZE/BANDAGES/DRESSINGS) IMPLANT
DURAPREP 26ML APPLICATOR (WOUND CARE) ×6 IMPLANT
ELECT REM PT RETURN 9FT ADLT (ELECTROSURGICAL) ×3
ELECTRODE REM PT RTRN 9FT ADLT (ELECTROSURGICAL) ×1 IMPLANT
EVACUATOR 1/8 PVC DRAIN (DRAIN) IMPLANT
FACESHIELD WRAPAROUND (MASK) ×12 IMPLANT
FACESHIELD WRAPAROUND OR TEAM (MASK) ×4 IMPLANT
GAUZE SPONGE 2X2 8PLY STRL LF (GAUZE/BANDAGES/DRESSINGS) IMPLANT
GLOVE BIOGEL PI IND STRL 6.5 (GLOVE) IMPLANT
GLOVE BIOGEL PI IND STRL 7.5 (GLOVE) ×1 IMPLANT
GLOVE BIOGEL PI IND STRL 8 (GLOVE) ×1 IMPLANT
GLOVE BIOGEL PI IND STRL 8.5 (GLOVE) IMPLANT
GLOVE BIOGEL PI INDICATOR 6.5 (GLOVE) ×2
GLOVE BIOGEL PI INDICATOR 7.5 (GLOVE) ×2
GLOVE BIOGEL PI INDICATOR 8 (GLOVE) ×4
GLOVE BIOGEL PI INDICATOR 8.5 (GLOVE) ×2
GLOVE ECLIPSE 8.0 STRL XLNG CF (GLOVE) ×3 IMPLANT
GLOVE ORTHO TXT STRL SZ7.5 (GLOVE) ×6 IMPLANT
GLOVE SURG SS PI 7.0 STRL IVOR (GLOVE) ×4 IMPLANT
GLOVE SURG SS PI 8.0 STRL IVOR (GLOVE) ×2 IMPLANT
GOWN SPEC L3 XXLG W/TWL (GOWN DISPOSABLE) ×3 IMPLANT
GOWN STRL REUS W/TWL LRG LVL3 (GOWN DISPOSABLE) ×5 IMPLANT
GOWN STRL REUS W/TWL XL LVL3 (GOWN DISPOSABLE) ×2 IMPLANT
KIT BASIN OR (CUSTOM PROCEDURE TRAY) ×3 IMPLANT
LEGGING LITHOTOMY PAIR STRL (DRAPES) ×3 IMPLANT
MANIFOLD NEPTUNE II (INSTRUMENTS) ×3 IMPLANT
NDL SAFETY ECLIPSE 18X1.5 (NEEDLE) ×1 IMPLANT
NEEDLE HYPO 18GX1.5 SHARP (NEEDLE) ×3
PACK TOTAL JOINT (CUSTOM PROCEDURE TRAY) ×3 IMPLANT
SPONGE GAUZE 2X2 STER 10/PKG (GAUZE/BANDAGES/DRESSINGS)
SUCTION FRAZIER TIP 10 FR DISP (SUCTIONS) ×3 IMPLANT
SUT MNCRL AB 4-0 PS2 18 (SUTURE) ×3 IMPLANT
SUT VIC AB 1 CT1 36 (SUTURE) ×5 IMPLANT
SUT VIC AB 2-0 CT1 27 (SUTURE) ×6
SUT VIC AB 2-0 CT1 TAPERPNT 27 (SUTURE) ×2 IMPLANT
SUT VLOC 180 0 24IN GS25 (SUTURE) ×3 IMPLANT
SYRINGE 60CC LL (MISCELLANEOUS) ×3 IMPLANT
TOWEL OR 17X26 10 PK STRL BLUE (TOWEL DISPOSABLE) ×3 IMPLANT
TOWEL OR NON WOVEN STRL DISP B (DISPOSABLE) ×2 IMPLANT
TRAY FOLEY CATH 14FRSI W/METER (CATHETERS) ×2 IMPLANT

## 2014-04-19 NOTE — Op Note (Signed)
NAME: [name] [facility]    MEDICAL RECORD NO.: [id number]   FACILITY: [facility] OF BIRTH: September 11, 1975  PHYSICIAN: Pietro Cassis. Alvan Dame, M.D.    DATE OF PROCEDURE: 04/19/2014    OPERATIVE REPORT   PREOPERATIVE DIAGNOSIS: right knee medial compartment osteoarthritis.   POSTOPERATIVE DIAGNOSIS: Right knee medial compartment osteoarthritis.  PROCEDURE: Right partial knee replacement utilizing Biomet Oxford knee  component, size small femur, a right medial size AA tibial tray with a size 4 mm insert.   SURGEON: Pietro Cassis. Alvan Dame, M.D.   ASSISTANT: Danae Orleans, PAC.  Please note that Mr. [name] was present for the entirety of the case,  utilized for preoperative positioning, perioperative retractor  management, general facilitation of the case and primary wound closure.   ANESTHESIA: spinal.   SPECIMENS: None.   COMPLICATIONS: None.  DRAINS: 1 medium HV   TOURNIQUET TIME: 31 minutes at 250 mmHg.   INDICATIONS FOR PROCEDURE: The patient is a 38 yo female patient who was referred for surgical evaluation of her bilateral (st this point right greater than left) knee pain.  They presented with primary complaints of pain on the medial side of their knee. Radiographs revealed advanced medial compartment arthritis with specifically an antero-medial wear pattern.  There was bone on bone changes noted with subchondral sclerosis and osteophytes present. The patient has had progressive problems failing to respond to conservative measures of medications, injections and activity modification. Risks of infection, DVT, component failure, need for future revision surgery were all discussed and reviewed.  Consent was obtained for benefit of pain relief.   PROCEDURE IN DETAIL: The patient was brought to the operative theater.  Once adequate anesthesia, preoperative antibiotics, 2gm Ancef administered, the patient was positioned in supine position with a right thigh tourniquet  placed. The right lower  extremity was prepped and draped in sterile  fashion with the leg on the Oxford leg holder.  The leg was allowed to flex to 120 degrees. A time-out  was performed identifying the patient, planned procedure, and extremity.  The leg was exsanguinated, tourniquet elevated to 250 mmHg. A midline  incision was made from the proximal pole of the patella to the tibial tubercle. A  soft tissue plane was created and partial median arthrotomy was then  made to allow for subluxation of the patella. Following initial synovectomy and  debridement, the osteophytes were removed off the medial aspect of the  knee.   Attention was first directed to the tibia. The tibial  extramedullary guide was positioned over the anterior crest of the tibia  and pinned into position, and using a measured resection guide from the  [place] system, a 4 mm resection was made off the proximal tibia. First  the reciprocating saw along the medial aspect of the tibial spines, then the oscillating saw.    At this point, I sized this cut surface seem to be best fit for a size AA tibial tray.  With the retractors out of the wound and the knee held at 90 degrees the size 4 feeler gauge had appropriate tension on the medial ligament.   At this point, the femoral canal was opened with a drill and the  intramedullary rod passed. Then using the guide for a small posterior resection off  the posterior aspect of the femur was positioned over the mid portion of the medial femoral condyle.  The orientation was set using the guide that mates the femoral guide to the intramedullary rod.  The 2 drill  holes were made into the distal femur.  The posterior guide was then impacted into place and the posterior  femoral cut made.  At this point, I milled the distal femur with a size 4 spigot in place. At this point, we did a trial reduction of the small femur, size AA tibial tray and a size 4 feeler gauge. At 20 degrees of flexion the knee was tighter  and felt similar to the 4 feeler gauge at 90 degrees with the size 3 feeler gauge.  Given the difference in the tension between the knee in 90 degrees versus that in 20 degrees I had to place the 5 spigot into the femur and re-mill the distal femur.  Remaining bone was removed and debrided.  I repeated the trial reduction and found that now at both 90 degrees and 20 degrees the knee ligament were tension symmetrically with the 4 feeler gauge.  Given these findings, the trial femoral component was removed. Final preparation of tibia was carried out by pinning it in position. Then  using a reciprocating saw I removed bone for the keel. Further bone was  removed with an osteotome.  Trial reduction was now carried out with the small femur, the AA keeled tibia, and a size 4 lollipop insert. The balance of the  ligaments appeared to be symmetric at 20 degrees and 90 degrees. Given  all these findings, the trial components were removed.   Cement was mixed. The final components were opened. The knee was irrigated with  normal saline solution. Then final debridements of the  soft tissue was carried out, I also drilled the sclerotic bone with a drill.  The final components were cemented with a single batch of cement in a  two-stage technique with the tibial component cemented first. The knee  was then brought  to 45 degrees of flexion with a size 4 feeler gauge, held with pressure for a minute and half.  After this the femoral component was cemented in place.  The knee was again held at 45 degrees of flexion while the cement fully cured.  Excess cement was removed throughout the knee. Tourniquet was let down  after 31 minutes. After the cement had fully cured and excessive cement  was removed throughout the knee there was no visualized cement present.   The final right medial size 4 mm insert to match a small femur was chosen and snapped into position. We re-irrigated  the knee. The extensor mechanism   was then reapproximated using a #1 Vicryl with the knee in flexion. The  remaining wound was closed with 2-0 Vicryl and a running 4-0 Monocryl.  The knee was cleaned, dried, and dressed sterilely using Dermabond and  Aquacel dressing. The patient was brought to the recovery room, Ace wrap in place, tolerating the  procedure well. He will be in the hospital for overnight observation due to her neck size and concern for breathing issues as well as pain control.  We will initiate physical therapy and progress to ambulate.     Pietro Cassis Alvan Dame, M.D.

## 2014-04-19 NOTE — Evaluation (Signed)
Physical Therapy Evaluation Patient Details Name: [name] MRN: [id number] DOB: 11-26-1975 Today's Date: 04/19/2014   History of Present Illness  Pt is a 38 year old female s/p right medial unicompartmental knee arthroplasty.  Clinical Impression  Patient is s/p right medial unicompartmental knee arthroplasty surgery resulting in functional limitations due to the deficits listed below (see PT Problem List).  Patient will benefit from skilled PT to increase their independence and safety with mobility to allow discharge to the venue listed below.  Pt mobilizing well POD #0 and plans to d/c home tomorrow with spouse.        Follow Up Recommendations Home health PT    Equipment Recommendations  Rolling walker with 5" wheels    Recommendations for Other Services       Precautions / Restrictions Precautions Precautions: Knee Restrictions Weight Bearing Restrictions: No Other Position/Activity Restrictions: WBAT      Mobility  Bed Mobility Overal bed mobility: Needs Assistance Bed Mobility: Supine to Sit     Supine to sit: Min assist;HOB elevated     General bed mobility comments: verbal cues for technique, assist for R LE over EOB to foot on floor  Transfers Overall transfer level: Needs assistance Equipment used: Rolling walker (2 wheeled) Transfers: Sit to/from Stand Sit to Stand: Min assist;+2 safety/equipment         General transfer comment: verbal cues for safe technique including UE and LE placement  Ambulation/Gait Ambulation/Gait assistance: +2 safety/equipment;Min assist Ambulation Distance (Feet): 90 Feet Assistive device: Rolling walker (2 wheeled) Gait Pattern/deviations: Step-to pattern;Step-through pattern;Antalgic     General Gait Details: verbal cues for step length, RW positioning, sequence, posture, pt able to progress to step through, pt reported R knee buckling upon returning to recliner  Stairs            Wheelchair Mobility     Modified Rankin (Stroke Patients Only)       Balance                                             Pertinent Vitals/Pain Pain Assessment: 0-10 Pain Score: 2  Pain Location: R knee Pain Descriptors / Indicators: Sore;Aching Pain Intervention(s): Premedicated before session;Repositioned;Ice applied;Limited activity within patient's tolerance    Home Living Family/patient expects to be discharged to:: Private residence Living Arrangements: Spouse/significant other   Type of Home: House Home Access: Stairs to enter Entrance Stairs-Rails: None Technical brewer of Steps: 2 Home Layout: One level Home Equipment: Environmental consultant - standard      Prior Function Level of Independence: Independent               Hand Dominance        Extremity/Trunk Assessment               Lower Extremity Assessment: RLE deficits/detail RLE Deficits / Details: good quad contraction, observed at least 50* knee flexion with functional activity       Communication   Communication: No difficulties  Cognition Arousal/Alertness: Awake/alert Behavior During Therapy: WFL for tasks assessed/performed Overall Cognitive Status: Within Functional Limits for tasks assessed                      General Comments      Exercises        Assessment/Plan    PT Assessment Patient needs continued PT services  PT Diagnosis Difficulty walking   PT Problem List Decreased strength;Decreased range of motion;Decreased mobility;Decreased knowledge of use of DME;Decreased knowledge of precautions;Pain  PT Treatment Interventions Functional mobility training;Gait training;DME instruction;Stair training;Patient/family education;Therapeutic activities;Therapeutic exercise   PT Goals (Current goals can be found in the Care Plan section) Acute Rehab PT Goals PT Goal Formulation: With patient Time For Goal Achievement: 04/22/14 Potential to Achieve Goals: Good    Frequency  7X/week   Barriers to discharge        Co-evaluation               End of Session   Activity Tolerance: Patient tolerated treatment well Patient left: in chair;with call bell/phone within reach;with family/visitor present Nurse Communication: Mobility status    Functional Assessment Tool Used: clinical judgement Functional Limitation: Mobility: Walking and moving around Mobility: Walking and Moving Around Current Status (U4403): At least 20 percent but less than 40 percent impaired, limited or restricted Mobility: Walking and Moving Around Goal Status [phone]): At least 1 percent but less than 20 percent impaired, limited or restricted    Time: 9563-8756 PT Time Calculation (min): 27 min   Charges:   PT Evaluation $Initial PT Evaluation Tier I: 1 Procedure PT Treatments $Gait Training: 8-22 mins   PT G Codes:   Functional Assessment Tool Used: clinical judgement Functional Limitation: Mobility: Walking and moving around    [place] E 04/19/2014, 5:40 PM Carmelia Bake, PT, DPT 04/19/2014 Pager: [phone]

## 2014-04-19 NOTE — Progress Notes (Signed)
CARE MANAGEMENT NOTE 04/19/2014  Patient:  MCKENSIE, SCOTTI   Account Number:  [id number]  Date Initiated:  04/19/2014  Documentation initiated by:  DAVIS,RHONDA  Subjective/Objective Assessment:   partial total knee     Action/Plan:   home when stable   Anticipated DC Date:  04/22/2014   Anticipated DC Plan:  [place] referral  NA      DC Planning Services  CM consult      [facility] Choice  NA   Choice offered to / List presented to:  C-1 Patient        [place] arranged  HH-2 PT      [place]   Status of service:  In process, will continue to follow Medicare Important Message given?   (If response is "NO", the following Medicare IM given date fields will be blank) Date Medicare IM given:   Medicare IM given by:   Date Additional Medicare IM given:   Additional Medicare IM given by:    Discharge Disposition:    Per UR Regulation:  Reviewed for med. necessity/level of care/duration of stay  If discussed at [place] of Stay Meetings, dates discussed:    Comments:  Suanne Marker Davis,Rn,BSN,CCM

## 2014-04-19 NOTE — Anesthesia Procedure Notes (Signed)
Spinal  Patient location during procedure: OR Staffing Anesthesiologist: Fenix Rorke Performed by: anesthesiologist  Preanesthetic Checklist Completed: patient identified, site marked, surgical consent, pre-op evaluation, timeout performed, IV checked, risks and benefits discussed and monitors and equipment checked Spinal Block Patient position: sitting Prep: Betadine Patient monitoring: heart rate, continuous pulse ox and blood pressure Approach: right paramedian Location: L3-4 Injection technique: single-shot Needle Needle type: Spinocan  Needle gauge: 22 G Needle length: 9 cm Additional Notes Expiration date of kit checked and confirmed. Patient tolerated procedure well, without complications.     

## 2014-04-19 NOTE — Transfer of Care (Signed)
Immediate Anesthesia Transfer of Care Note  Patient: [name]  Procedure(s) Performed: Procedure(s): RIGHT UNI KNEE ARTHROPLASTY MEDIALLY  (Right)  Patient Location: PACU  Anesthesia Type:Spinal  Level of Consciousness: awake, alert , oriented and patient cooperative  Airway & Oxygen Therapy: Patient Spontanous Breathing and Patient connected to face mask oxygen  Post-op Assessment: Report given to PACU RN and Post -op Vital signs reviewed and stable  Post vital signs: Reviewed and stable  Complications: No apparent anesthesia complications

## 2014-04-19 NOTE — Interval H&P Note (Signed)
History and Physical Interval Note:  04/19/2014 9:12 AM  [name]  has presented today for surgery, with the diagnosis of RIGHT KNEE MEDIAL COMPARTMENTAL OA   The various methods of treatment have been discussed with the patient and family. After consideration of risks, benefits and other options for treatment, the patient has consented to  Procedure(s): RIGHT UNI KNEE ARTHROPLASTY MEDIALLY  (Right) as a surgical intervention .  The patient's history has been reviewed, patient examined, no change in status, stable for surgery.  I have reviewed the patient's chart and labs.  Questions were answered to the patient's satisfaction.     Mauri Pole

## 2014-04-19 NOTE — Anesthesia Postprocedure Evaluation (Signed)
  Anesthesia Post-op Note  Patient: [name]  Procedure(s) Performed: Procedure(s) (LRB): RIGHT UNI KNEE ARTHROPLASTY MEDIALLY  (Right)  Patient Location: PACU  Anesthesia Type: Spinal  Level of Consciousness: awake and alert   Airway and Oxygen Therapy: Patient Spontanous Breathing  Post-op Pain: mild  Post-op Assessment: Post-op Vital signs reviewed, Patient's Cardiovascular Status Stable, Respiratory Function Stable, Patent Airway and No signs of Nausea or vomiting  Last Vitals:  Filed Vitals:   04/19/14 1315  BP: 138/84  Pulse: 96  Temp: 36.7 C  Resp:     Post-op Vital Signs: stable   Complications: No apparent anesthesia complications

## 2014-04-19 NOTE — Progress Notes (Signed)
Pt seen, found already wearing home cpap machine and tolerating well.  Sterile water was added to humidity chamber per pt request.  No frays on cord or obvious defects noted.

## 2014-04-20 ENCOUNTER — Encounter (HOSPITAL_COMMUNITY): Payer: Self-pay | Admitting: Orthopedic Surgery

## 2014-04-20 DIAGNOSIS — E662 Morbid (severe) obesity with alveolar hypoventilation: Secondary | ICD-10-CM

## 2014-04-20 DIAGNOSIS — M1710 Unilateral primary osteoarthritis, unspecified knee: Secondary | ICD-10-CM | POA: Diagnosis not present

## 2014-04-20 LAB — BASIC METABOLIC PANEL
ANION GAP: 11 (ref 5–15)
BUN: 22 mg/dL (ref 6–23)
CO2: 24 meq/L (ref 19–32)
Calcium: 9 mg/dL (ref 8.4–10.5)
Chloride: 103 mEq/L (ref 96–112)
Creatinine, Ser: 0.78 mg/dL (ref 0.50–1.10)
GFR calc Af Amer: >90 mL/min (ref >90)
GFR calc non Af Amer: >90 mL/min (ref >90)
Glucose, Bld: 141 mg/dL — ABNORMAL HIGH (ref 70–99)
Potassium: 4.5 mEq/L (ref 3.7–5.3)
SODIUM: 138 meq/L (ref 137–147)

## 2014-04-20 LAB — GLUCOSE, CAPILLARY: GLUCOSE-CAPILLARY: 99 mg/dL (ref 70–99)

## 2014-04-20 LAB — CBC
HEMATOCRIT: 38.2 % (ref 36.0–46.0)
Hemoglobin: 12.4 g/dL (ref 12.0–15.0)
MCH: 27.9 pg (ref 26.0–34.0)
MCHC: 32.5 g/dL (ref 30.0–36.0)
MCV: 85.8 fL (ref 78.0–100.0)
Platelets: 348 10*3/uL (ref 150–400)
RBC: 4.45 MIL/uL (ref 3.87–5.11)
RDW: 14 % (ref 11.5–15.5)
WBC: 10.3 10*3/uL (ref 4.0–10.5)

## 2014-04-20 MED ORDER — HYDROCODONE-ACETAMINOPHEN 7.5-325 MG PO TABS: 1.0000 | ORAL_TABLET | ORAL | Status: DC

## 2014-04-20 MED ORDER — ASPIRIN 325 MG PO TBEC: 325.0000 mg | DELAYED_RELEASE_TABLET | Freq: Two times a day (BID) | ORAL | Status: AC

## 2014-04-20 MED ORDER — FERROUS SULFATE 325 (65 FE) MG PO TABS: 325.0000 mg | ORAL_TABLET | Freq: Three times a day (TID) | ORAL | Status: DC

## 2014-04-20 MED ORDER — DSS 100 MG PO CAPS: 100.0000 mg | ORAL_CAPSULE | Freq: Two times a day (BID) | ORAL | Status: DC

## 2014-04-20 MED ORDER — METHOCARBAMOL 500 MG PO TABS: 500.0000 mg | ORAL_TABLET | Freq: Four times a day (QID) | ORAL | Status: DC | PRN

## 2014-04-20 MED ORDER — POLYETHYLENE GLYCOL 3350 17 G PO PACK: 17.0000 g | PACK | Freq: Two times a day (BID) | ORAL | Status: DC

## 2014-04-20 NOTE — Evaluation (Addendum)
Occupational Therapy Evaluation Patient Details Name: [name] MRN: [id number] DOB: 01/29/1976 Today's Date: 04/20/2014    History of Present Illness Pt is a 38 year old female s/p right medial unicompartmental knee arthroplasty.   Clinical Impression   Pt was admitted for the above surgery. All education was completed.  No further OT is needed at this time.      Follow Up Recommendations  No OT follow up    Equipment Recommendations  3 in 1 bedside comode (may need wide)    Recommendations for Other Services       Precautions / Restrictions Precautions Precautions: Knee Restrictions Other Position/Activity Restrictions: WBAT      Mobility Bed Mobility                  Transfers     Transfers: Sit to/from Stand Sit to Stand: Supervision         General transfer comment: vcs for hand placement    Balance                                            ADL Overall ADL's : Needs assistance/impaired     Grooming: Wash/dry hands;Supervision/safety;Standing   Upper Body Bathing: Set up;Sitting   Lower Body Bathing: Minimal assistance;Sit to/from stand   Upper Body Dressing : Set up;Sitting   Lower Body Dressing: Minimal assistance;Sit to/from stand   Toilet Transfer: Supervision/safety;Ambulation;Grab bars;Comfort height toilet   Toileting- Clothing Manipulation and Hygiene: Supervision/safety;Sit to/from stand   Tub/ Shower Transfer: Walk-in shower;Supervision/safety;Ambulation     General ADL Comments: ambulated to bathroom and performed toilet and shower transfers.  Got dressed from commode. Educated on AE:  Pt has an inexpensive reacher:  She is able to don pants and underwear without this.  Using it for a washcloth or having a long sponge would be helpful or she can have  husband assist as needed at home.  Pt's biggest concern is being home alone with son, who has autism, if he gets upset, which is rare.  Recommended someone  be with her.  Also, grandparents could have her son stay with them per husband, but pt does not want this.  Husband works 2 and 3rd shifts.     Vision                     Perception     Praxis      Pertinent Vitals/Pain Pain Assessment: No/denies pain     Hand Dominance     Extremity/Trunk Assessment Upper Extremity Assessment Upper Extremity Assessment: Overall WFL for tasks assessed           Communication Communication Communication: No difficulties   Cognition Arousal/Alertness: Awake/alert Behavior During Therapy: WFL for tasks assessed/performed Overall Cognitive Status: Within Functional Limits for tasks assessed                     General Comments       Exercises       Shoulder Instructions      Home Living Family/patient expects to be discharged to:: Private residence Living Arrangements: Spouse/significant other   Type of Home: House Home Access: Stairs to enter [employer] of Steps: 2 Entrance Stairs-Rails: None Home Layout: One level     Bathroom Shower/Tub: Occupational psychologist: Standard     Home  Equipment: Walker - standard   Additional Comments: has one higher toilet in hall      Prior Functioning/Environment Level of Independence: Independent             OT Diagnosis:     OT Problem List:     OT Treatment/Interventions:      OT Goals(Current goals can be found in the care plan section)    OT Frequency:     Barriers to D/C:            Co-evaluation              End of Session    Activity Tolerance: Patient tolerated treatment well Patient left: in chair;with call bell/phone within reach;with family/visitor present   Time: 7290-2111 OT Time Calculation (min): 27 min Charges:  OT General Charges $OT Visit: 1 Procedure OT Evaluation $Initial OT Evaluation Tier I: 1 Procedure OT Treatments $Self Care/Home Management : 8-22 mins G-Codes:     Demeshia Sherburne 05-05-2014, 10:00 AM Lesle Chris, OTR/L [phone] May 05, 2014

## 2014-04-20 NOTE — Progress Notes (Signed)
Rn reviewed discharge and medications with patient and husband. All questions answered.   Paperwork and prescriptions given.   NT rolled patient down to family car.

## 2014-04-20 NOTE — Progress Notes (Signed)
04/20/14 0900  OT G-codes **NOT FOR INPATIENT CLASS**  Functional Assessment Tool Used clinical observation  Functional Limitation Self care  Self Care Current Status (J8250) CJ  Self Care Goal Status (N3976) [facility]  Self Care Discharge Status [phone]) Kae Heller, OTR/L [phone] 04/20/2014

## 2014-04-20 NOTE — Progress Notes (Signed)
   Subjective: 1 Day Post-Op Procedure(s) (LRB): RIGHT UNI KNEE ARTHROPLASTY MEDIALLY  (Right)   Seen by Dr. Alvan Dame. Patient reports pain as mild, pain controlled. No events throughout the night. Ready to be discharged home.   Objective:   VITALS:   Filed Vitals:   04/20/14  BP: 111/49  Pulse: 62  Temp: 98.9 F (37.2 C)   Resp: 16    Dorsiflexion/Plantar flexion intact Incision: dressing C/D/I No cellulitis present Compartment soft ACE bandage removed  LABS  Recent Labs  04/20/14 0425  HGB 12.4  HCT 38.2  WBC 10.3  PLT 348     Recent Labs  04/20/14 0425  NA 138  K 4.5  BUN 22  CREATININE 0.78  GLUCOSE 141*     Assessment/Plan: 1 Day Post-Op Procedure(s) (LRB): RIGHT UNI KNEE ARTHROPLASTY MEDIALLY  (Right) Foley cath d/c'ed Advance diet Up with therapy D/C IV fluids Discharge home with home health Follow up in 2 weeks at [facility]. Follow up with OLIN,Deklen Popelka D in 2 weeks.  Contact information:  [facility] [address], [place] [address] [phone]     Morbid Obesity (BMI >40)  Estimated body mass index is 57.97 kg/(m^2) as calculated from the following:   Height as of this encounter: 5\' 2"  (1.575 m).   Weight as of this encounter: 143.79 kg (317 lb). Patient also counseled that weight may inhibit the healing process Patient counseled that losing weight will help with future health issues        West Pugh. Treniece Holsclaw   PAC  04/20/2014, 10:05 AM

## 2014-04-20 NOTE — Progress Notes (Signed)
Physical Therapy Treatment Patient Details Name: [name] MRN: [id number] DOB: 1976/06/26 Today's Date: 04/20/2014    History of Present Illness Pt is a 38 year old female s/p right medial unicompartmental knee arthroplasty.    PT Comments    Pt is mobilizing very well today and practiced stair technique with spouse present.  Pt also performed LE exercises and provided HEP handout.  Pt had no further question/concerns and feels ready for d/c home today.   Follow Up Recommendations  Home health PT     Equipment Recommendations  Rolling walker with 5" wheels    Recommendations for Other Services       Precautions / Restrictions Precautions Precautions: Knee Restrictions Other Position/Activity Restrictions: WBAT    Mobility  Bed Mobility               General bed mobility comments: pt up in recliner on arrival  Transfers Overall transfer level: Needs assistance Equipment used: Rolling walker (2 wheeled) Transfers: Sit to/from Stand Sit to Stand: Supervision         General transfer comment: verbal cues for UE and LE placement  Ambulation/Gait Ambulation/Gait assistance: Supervision Ambulation Distance (Feet): 160 Feet Assistive device: Rolling walker (2 wheeled) Gait Pattern/deviations: Step-through pattern;Antalgic     General Gait Details: verbal cues for step length, RW positioning   Stairs Stairs: Yes Stairs assistance: Min guard Stair Management: Step to pattern;Two rails;Forwards Number of Stairs: 2 General stair comments: verbal cues for safety, sequence; spouse present and educated on safe technique as well  Wheelchair Mobility    Modified Rankin (Stroke Patients Only)       Balance                                    Cognition Arousal/Alertness: Awake/alert Behavior During Therapy: WFL for tasks assessed/performed Overall Cognitive Status: Within Functional Limits for tasks assessed                       Exercises Total Joint Exercises Ankle Circles/Pumps: AROM;Both;15 reps Quad Sets: AROM;Both;10 reps Towel Squeeze: AROM;Both;10 reps Heel Slides: AAROM;Right;10 reps Hip ABduction/ADduction: AROM;Right;10 reps Straight Leg Raises: AROM;Right;10 reps Long Arc Quad: AROM;Seated;Right;10 reps    General Comments        Pertinent Vitals/Pain Pain Assessment: No/denies pain Pain Intervention(s): Ice applied;Monitored during session;Premedicated before session    Home Living Family/patient expects to be discharged to:: Private residence Living Arrangements: Spouse/significant other   Type of Home: House Home Access: Stairs to enter Entrance Stairs-Rails: None Home Layout: One level Home Equipment: Environmental consultant - standard Additional Comments: has one higher toilet in hall    Prior Function Level of Independence: Independent          PT Goals (current goals can now be found in the care plan section) Progress towards PT goals: Progressing toward goals    Frequency  7X/week    PT Plan Current plan remains appropriate    Co-evaluation             End of Session   Activity Tolerance: Patient tolerated treatment well Patient left: in chair;with call bell/phone within reach;with family/visitor present     Time: 6226-3335 PT Time Calculation (min): 24 min  Charges:  $Gait Training: 8-22 mins $Therapeutic Exercise: 8-22 mins                    G  CodesTrena Platt 04-25-2014, 11:34 AM Carmelia Bake, PT, DPT 04/25/14 Pager: [phone]

## 2014-04-28 NOTE — Discharge Summary (Signed)
Physician Discharge Summary  Patient ID: [name] MRN: [id number] DOB/AGE: 18-Jul-1976 38 y.o.  Admit date: 04/19/2014 Discharge date: 04/20/2014   Procedures:  Procedure(s) (LRB): RIGHT UNI KNEE ARTHROPLASTY MEDIALLY  (Right)  Attending Physician:  Dr. Paralee Cancel   Admission Diagnoses:   Right knee medial compartment OA / pain  Discharge Diagnoses:  Principal Problem:   S/P right UKR Active Problems:   Morbid obesity  Past Medical History  Diagnosis Date  . Diabetes mellitus type II   . High cholesterol   . Lower leg, muscle or tendon injury   . 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 Cushing disease     pt reports cushing was ruled out, but still has s/s  . Anxiety   . Depression   . Abdominal wall hernia     mid-abdomen  . Foot fracture, left     "stress fracture"-wears boot  . Sleep apnea     cpap used since 4'06- automatic settings    HPI: [name], 38 y.o. female, has a history of pain and functional disability in the right knee due to arthritis and has failed non-surgical conservative treatments for greater than 12 weeks to include NSAID's and/or analgesics, corticosteriod injections, viscosupplementation injections, use of assistive devices and activity modification. Onset of symptoms was gradual, starting >10 years ago with gradually worsening course since that time. The patient noted no past surgery on the right knee(s). Patient currently rates pain in the right knee(s) at 8 out of 10 with activity. Patient has night pain, worsening of pain with activity and weight bearing, pain that interferes with activities of daily living, pain with passive range of motion, crepitus and joint swelling. Patient has evidence of periarticular osteophytes and joint space narrowing of the medial compartment by imaging studies. There is no active infection. Risks, benefits and expectations were discussed with the patient. Risks  including but not limited to the risk of anesthesia, blood clots, nerve damage, blood vessel damage, failure of the prosthesis, infection and up to and including death. Patient understand the risks, benefits and expectations and wishes to proceed with surgery.   PCP: Annye Asa, MD   Discharged Condition: good  Hospital Course:  Patient underwent the above stated procedure on 04/19/2014. Patient tolerated the procedure well and brought to the recovery room in good condition and subsequently to the floor.  POD #1 BP: 111/49 ; Pulse: 62 ; Temp: 98.9 F (37.2 C) ; Resp: 16 Patient reports pain as mild, pain controlled. No events throughout the night. Ready to be discharged home.  Dorsiflexion/plantar flexion intact, incision: dressing C/D/I, no cellulitis present and compartment soft.   LABS  Basename    HGB  12.4  HCT  38.2    Discharge Exam: General appearance: alert, cooperative and no distress Extremities: Homans sign is negative, no sign of DVT, no edema, redness or tenderness in the calves or thighs and no ulcers, gangrene or trophic changes  Disposition: Home with follow up in 2 weeks   Follow-up Information   Follow up with Mauri Pole, MD. Schedule an appointment as soon as possible for a visit in 2 weeks.   Specialty:  Orthopedic Surgery   Contact information:   [address] [place] [address] [phone]       Discharge Instructions   Call MD / Call 911    Complete by:  As directed   If you  experience chest pain or shortness of breath, CALL 911 and be transported to the hospital emergency room.  If you develope a fever above 101 F, pus (white drainage) or increased drainage or redness at the wound, or calf pain, call your surgeon's office.     Change dressing    Complete by:  As directed   Maintain surgical dressing for 10-14 days, or until follow up in the clinic.     Constipation Prevention    Complete by:  As directed   Drink  plenty of fluids.  Prune juice may be helpful.  You may use a stool softener, such as Colace (over the counter) 100 mg twice a day.  Use MiraLax (over the counter) for constipation as needed.     Diet - low sodium heart healthy    Complete by:  As directed      Discharge instructions    Complete by:  As directed   Maintain surgical dressing for 10-14 days, or until follow up in the clinic. Follow up in 2 weeks at [facility]. Call with any questions or concerns.     Driving restrictions    Complete by:  As directed   No driving for 4 weeks     Increase activity slowly as tolerated    Complete by:  As directed      TED hose    Complete by:  As directed   Use stockings (TED hose) for 2 weeks on both leg(s).  You may remove them at night for sleeping.     Weight bearing as tolerated    Complete by:  As directed   Laterality:  right  Extremity:  Lower             Medication List    STOP taking these medications       ibuprofen 200 MG tablet  Commonly known as:  ADVIL,MOTRIN      TAKE these medications       aspirin 325 MG EC tablet  Take 1 tablet (325 mg total) by mouth 2 (two) times daily.     DSS 100 MG Caps  Take 100 mg by mouth 2 (two) times daily.     ferrous sulfate 325 (65 FE) MG tablet  Take 1 tablet (325 mg total) by mouth 3 (three) times daily after meals.     gabapentin 300 MG capsule  Commonly known as:  NEURONTIN  Take 600 mg by mouth 2 (two) times daily.     gemfibrozil 600 MG tablet  Commonly known as:  LOPID  Take 600 mg by mouth 2 (two) times daily before a meal.     HYDROcodone-acetaminophen 7.5-325 MG per tablet  Commonly known as:  NORCO  Take 1-2 tablets by mouth every 4 (four) hours.     hydrOXYzine 50 MG capsule  Commonly known as:  VISTARIL  Take 50 mg by mouth 2 (two) times daily. sleep     insulin aspart protamine- aspart (70-30) 100 UNIT/ML injection  Commonly known as:  NOVOLOG MIX 70/30  Inject 80 Units into the skin 2  (two) times daily.     INVOKAMET [phone] MG Tabs  Generic drug:  Canagliflozin-Metformin HCl  Take 1 tablet by mouth 2 (two) times daily.     levothyroxine 100 MCG tablet  Commonly known as:  SYNTHROID, LEVOTHROID  Take 50 mcg by mouth every morning.     methocarbamol 500 MG tablet  Commonly known as:  ROBAXIN  Take 1 tablet (  500 mg total) by mouth every 6 (six) hours as needed for muscle spasms.     pioglitazone 45 MG tablet  Commonly known as:  ACTOS  Take 45 mg by mouth every morning.     polyethylene glycol packet  Commonly known as:  MIRALAX / GLYCOLAX  Take 17 g by mouth 2 (two) times daily.     quinapril 5 MG tablet  Commonly known as:  ACCUPRIL  Take 2.5 mg by mouth every morning.     venlafaxine XR 37.5 MG 24 hr capsule  Commonly known as:  EFFEXOR-XR  Take 37.5 mg by mouth 2 (two) times daily.     zaleplon 5 MG capsule  Commonly known as:  SONATA  Take 5 mg by mouth at bedtime as needed for sleep.         Signed: West Pugh. Alexande Sheerin   PA-C  04/28/2014, 10:32 PM

## 2014-07-05 ENCOUNTER — Encounter (HOSPITAL_COMMUNITY): Payer: Self-pay | Admitting: Emergency Medicine

## 2014-07-05 ENCOUNTER — Emergency Department (HOSPITAL_COMMUNITY)
Admission: EM | Admit: 2014-07-05 | Discharge: 2014-07-05 | Disposition: A | Discharge status: Home / Self Care | Payer: BC Managed Care – PPO | Source: Home / Self Care | Attending: Emergency Medicine | Admitting: Emergency Medicine

## 2014-07-05 DIAGNOSIS — L0103 Bullous impetigo: Secondary | ICD-10-CM

## 2014-07-05 MED ORDER — CLINDAMYCIN HCL 300 MG PO CAPS: 300.0000 mg | ORAL_CAPSULE | Freq: Four times a day (QID) | ORAL | Status: DC

## 2014-07-05 MED ORDER — MUPIROCIN 2 % EX OINT: 1.0000 "application " | TOPICAL_OINTMENT | Freq: Three times a day (TID) | CUTANEOUS | Status: DC

## 2014-07-05 NOTE — ED Notes (Signed)
Pt  Started  Some  New meds   Approx  2 sev  Weeks  Ago   Developed   Some  Blisters  Which  Apparently  Where  Fluid filled     Low  abd  They may   Have  Ruptured  sev  Days  Ago  And  Now  Are  Red  Ulcerated  Lesions  On lower  abd  Area       Pt  Sitting  upruight on  Exam table  Speaking  In  Complete  sentances  And  Is  In no acute  Distress

## 2014-07-05 NOTE — Discharge Instructions (Signed)
Wash with soap and water, apply antibiotic ointment 3 times daily, keep covered, frequent hand washing.  Return here in 2 days for recheck.

## 2014-07-05 NOTE — ED Provider Notes (Signed)
  Chief Complaint    Rash   History of Present Illness      Ariadne L Nation is a 38 year old diabetic female who has had a six-day history of a blistering rash on her lower abdomen. Some of the blisters have broken and crusted. She denies any pain or itching. This weekend she had a low-grade temperature of 100.3. She has no fever today. She denies any exposures. She has no rash elsewhere on her body.  Review of Systems   Other than as noted above, the patient denies any of the following symptoms: Systemic:  No fever or chills. ENT:  No nasal congestion, rhinorrhea, sore throat, swelling of lips, tongue or throat. Resp:  No cough, wheezing, or shortness of breath.  [place]    Past medical history, family history, social history, meds, and allergies were reviewed. She is allergic to penicillin. Current meds include: Invokamet, docusate, ferrous sulfate, Neurontin, gemfibrozil, Norco, hydroxyzine, NovoLog Mix, Synthroid, Robaxin, Actos, MiraLAX, Accupril, venlafaxine, and Sonata.  Physical Exam     Vital signs:  BP 123/84  Pulse 87  Temp(Src) 97.9 F (36.6 C) (Oral)  Resp 16  SpO2 97% Gen:  Alert, oriented, in no distress. ENT:  Pharynx clear, no intraoral lesions, moist mucous membranes. Lungs:  Clear to auscultation. Skin:  There is a blistering rash on her lower abdomen with multiple fluid-filled blisters. Some of these have burst and are crusted over. Skin was otherwise clear.   Picture from patient's cell phone of blisters the day they first began, 6 days ago.   Image of blisters today.   Labs   One of the blisters near the midline was punctured with a #11 scalpel blade and the fluid was cultured.  Assessment    The encounter diagnosis was Bullous impetigo.  Plan     1.  Meds:  The following meds were prescribed:   New Prescriptions   CLINDAMYCIN (CLEOCIN) 300 MG CAPSULE    Take 1 capsule (300 mg total) by mouth 4 (four) times daily.   MUPIROCIN OINTMENT  (BACTROBAN) 2 %    Apply 1 application topically 3 (three) times daily.    2.  Patient Education/Counseling:  The patient was given appropriate handouts, self care instructions, and instructed in symptomatic relief.    3.  Follow up:  The patient was told to follow up here in 2 days, or sooner if becoming worse in any way, and given some red flag symptoms such as worsening rash, fever, or difficulty breathing which would prompt immediate return.  Follow up here if necessary.      Harden Mo, MD 07/05/14 [phone]

## 2014-07-07 ENCOUNTER — Encounter (HOSPITAL_COMMUNITY): Payer: Self-pay | Admitting: Emergency Medicine

## 2014-07-07 ENCOUNTER — Emergency Department (INDEPENDENT_AMBULATORY_CARE_PROVIDER_SITE_OTHER)
Admission: EM | Admit: 2014-07-07 | Discharge: 2014-07-07 | Disposition: A | Discharge status: Home / Self Care | Payer: BC Managed Care – PPO | Source: Home / Self Care | Attending: Emergency Medicine | Admitting: Emergency Medicine

## 2014-07-07 ENCOUNTER — Ambulatory Visit: Admit: 2014-07-07 | Discharge: 2014-07-07 | Payer: PRIVATE HEALTH INSURANCE | Attending: Internal Medicine

## 2014-07-07 DIAGNOSIS — R2232 Localized swelling, mass and lump, left upper limb: Secondary | ICD-10-CM

## 2014-07-07 DIAGNOSIS — L0103 Bullous impetigo: Secondary | ICD-10-CM

## 2014-07-07 NOTE — Discharge Instructions (Signed)
Wash with soap and water and apply ointment.  Finish up antibiotics.

## 2014-07-07 NOTE — ED Provider Notes (Signed)
  Chief Complaint    No chief complaint on file.   History of Present Illness      [name] is a 38 year old female who returns for a scheduled recheck on a blistering rash on her lower abdomen. This appeared about 9 days ago. She was in 2 days ago. Some of the blisters and crusted up, stem are still fluid-filled. One of the blisters was incised in the blister fluid was cultured. So far nothing is growing out. She was placed on clindamycin and mupirocin ointment. The wounds are getting better. They've all crusted over. None of the blisters are fluid filled right now. She states his getting somewhat itchy. It has not been painful. She denies any fever.  Review of Systems   Other than as noted above, the patient denies any of the following symptoms: Systemic:  No fever or chills. ENT:  No nasal congestion, rhinorrhea, sore throat, swelling of lips, tongue or throat. Resp:  No cough, wheezing, or shortness of breath.  [place]    Past medical history, family history, social history, meds, and allergies were reviewed.   Physical Exam     Vital signs:  BP 130/79  Pulse 87  Temp(Src) 97.8 F (36.6 C) (Oral)  Resp 16  SpO2 96% Gen:  Alert, oriented, in no distress. ENT:  Pharynx clear, no intraoral lesions, moist mucous membranes. Lungs:  Clear to auscultation. Skin:  All the blisters are crusted over. They appear to be healing well. There is no evidence of surrounding erythema, purulent drainage, or crepitus.    Assessment    The encounter diagnosis was Bullous impetigo.  Appears to be healing up well.  Plan     1.  Meds:  The following meds were prescribed:   New Prescriptions   No medications on file    2.  Patient Education/Counseling:  The patient was given appropriate handouts, self care instructions, and instructed in symptomatic relief.  Finish up her antibiotics. She can more see air with soap and water and applied antibiotic ointment.  3.  Follow up:  The  patient was told to follow up here if no better in 3 to 4 days, or sooner if becoming worse in any way, and given some red flag symptoms such as worsening rash, fever, or difficulty breathing which would prompt immediate return.  Follow up here if necessary.      Harden Mo, MD 07/07/14 1016

## 2014-07-07 NOTE — ED Notes (Signed)
Pt is here for a f/u on rash; feeling much better Denies any new changes Alert, no signs of acute distress.

## 2014-07-07 NOTE — Patient Instructions (Signed)
As needed

## 2014-07-07 NOTE — Progress Notes (Signed)
Subjective:      Patient ID: Rosario Jacksmy Lienau is a 38 y.o. female.    HPI  She noticed a lump in right axilla for 3 days and it's not painful.  No FH breast CA.  No fever/chills.  Not notice it today.    Review of Systems  was negative except of what was stated on HPI      Objective:   Physical Exam  Nursing note and vitals reviewed.    Filed Vitals:    07/07/14 1422   BP: 122/80   Pulse: 74   Temp: 99.4 ??F (37.4 ??C)   TempSrc: Oral   Resp: 18   Height: 5\' 8"  (1.727 m)   Weight: 265 lb (120.203 kg)     Wt Readings from Last 3 Encounters:   07/07/14 265 lb (120.203 kg)   04/16/14 260 lb (117.935 kg)   12/22/13 266 lb (120.657 kg)     BP Readings from Last 3 Encounters:   07/07/14 122/80   04/16/14 110/70   12/22/13 126/80     Body mass index is 40.3 kg/(m^2).  Constitutional: Patient appears well-developed and well-nourished. No distress.   Head: Normocephalic and atraumatic.   Neck: Normal range of motion. Neck supple. No thyroidmegaly.   Cardiovascular: Normal rate, regular rhythm, normal heart sounds and intact distal pulses.   Pulmonary/Chest: Effort normal and breath sounds normal. No stridor. No respiratory distress. No wheezes and no rales.   Abdominal: Soft. Bowel sounds are normal. No distension and no mass. No tenderness. No rebound and no guarding.   Musculoskeletal: No edema and no tenderness.   Skin: No rash or erythema.  Psychiatric: Normal mood and affect. Behavior is normal.   Right axilla: no lump noted on exam today.    Assessment:      Telesia was seen today for appointment requested.    Diagnoses and associated orders for this visit:    Axillary lump, left  If recurrent follow up           Plan:

## 2014-07-08 LAB — WOUND CULTURE
CULTURE: NO GROWTH
Gram Stain: NONE SEEN

## 2014-07-08 NOTE — Progress Notes (Signed)
Quick Note:  Test result was normal. No further action is needed at this time. ______ 

## 2014-07-20 ENCOUNTER — Encounter: Attending: Internal Medicine

## 2014-07-29 MED ORDER — LEVOTHYROXINE SODIUM 150 MCG PO TABS
150 MCG | ORAL_TABLET | ORAL | Status: DC
Start: 2014-07-29 — End: 2016-08-27

## 2014-07-29 NOTE — Telephone Encounter (Signed)
LAST REFILL 04/18/14  LAST AMOUNT 90 tablets   Last seen 07/07/2014  Next office visit   08/26/2014

## 2014-08-26 ENCOUNTER — Encounter: Attending: Internal Medicine

## 2014-11-11 NOTE — H&P (Signed)
[facility]  [place]  [address] Phone: [phone]  Fax: [phone]  Visit Note - Office Visit   Provider: Jed Limerick, DPM Encounter Date: Nov 09, 2014  Patient: [name], [name]    ([id number]) Sex: Female       DOB: Feb 04, 1976      Age: 39 year 97 month       Race: Unknown Address: [address],  [place]  [address] [address] [place]: [id number]  Referred By:  Vladimir Creeks Injury Date: 05/30/2014  Problem list: LIST OF CURRENT PROBLEMS AND ONGOING TREATMENT PLAN Multiple ankle sprains and injuries on the LEFT ankle. Weakness and instability in the LEFT ankle. Requesting ankle ligament reconstruction.   Complaint: Chief Complaint: consult ATF repair.  History of present illness: Nature: aching. Location: Left lateral ankle. Duration: 2 month(s). Onset: (Acute vs Insidious): Sudden. Character/Pain Level: 6 on a 10-point scale. Aggravating factors: prolonged standing and walking. Treatments: AFO, Cam walker and roll about. History positive for diabetes. IDDM.  Blood Glucose: Fasting Glucose=<100 mg/dl.  Current Medication: 1. Lamisil 250 Mg Tablet  SIG: 1 poqd 2. Lamisil 250 Mg Tablet  SIG: 1 poqd 3. Lamisil 250 Mg Tablet  SIG: 1 poqd 4. Clindamycin Hcl 300 Mg Capsule (Other MD)  5. Gabapentin 300 Mg Capsule (Other MD)  SIG: 1 POTID 6. Gemfibrozil 600 Mg Tablet (Other MD)  7. Hydroxyzine Hcl 50 Mg Tablet (Other MD)  8. Invokana 300 Mg Tablet (Other MD)  9. Levothyroxine 0.1 Mg Tablet 100 Mcg (Other MD)  10. Novolin 70-30 100 Unit/ml Vial Unit/ml (70-30) (Other MD)  11. Pioglitazone Hcl 45 Mg Tablet (Other MD)  12. Quinapril 5 Mg Tablet (Other MD)  13. Rozerem 8 Mg Tablet (Other MD)  14. Sonata 5 Mg Capsule (Other MD)  15. Tivorbex 40 Mg Capsule (Other MD)  16. Venlafaxine Hcl 37.5 Mg Tablet (Other MD)   ROS: Integument: Negative for rashes, skin cancer, slow healing  wounds, cellulitis. Musculoskeletal:  (+) for low back pain; (+) for knee pain. Constitutional: No fever, chills, or nausea.  Medical History: Diabetes.  Vascular grafts: None.  Joint implant(s): Positive for joint implant.  Heart valve(s): None.  Chemotherapy: No.  Serious Injuries: None.   Surgeries: C-section x 3, knee 04/19/14.  Family History/Social History: Patient denies being currently pregnant.  Smoking History: Patient denies tobacco use. Alcohol Use:  Patient drinks moderately.  INSULIN: Patient is on insulin. COUMADIN/PLAVIX USE: Patient denies taking coumadin/plavix. Family History is Unremarkable.  Allergy: Penicillins  Examination: Exam is unchanged.  Diagnosis: M21.6X9  [Subluxation LEFT Ankle]  M79.89  [Arthralgia]   Prescription: 1. Norco 7.5-325 Tablet Mg  SIG: one or two every four to six hours as needed for pain  QTY: 60.00  PLAN: Established office visit to discuss etiology treatment and prognosis   Diagnosis: Arthralgia (ankle arthralgia left) and Subluxation LEFT Ankle  EVALUATION & MANAGEMENT: Surgical Consultation: Discussed surgical versus conservative treatment options. Risks versus benefits were discussed along with the nature of the procedure, post-operative course. Discussed possible complications, not limited to, such as: slow-healing, infection, need for further surgery, chronic pain (RSDS), blood clots, bleeding problems, weakness, chronic swelling of the foot/digits, and arthritis. Rare but serious complications can even result in loss of digit, loss of limb, or loss of life. No guarantees were given. Alternatives to surgery were discussed today.  PLANNED PROCEDURE(S). Left: LEFT anterior talofibular ligament repair with ARTHREX  INTERNAL BRACE anchor system, she will need preop clearance, CPT [phone].   We will begin planning for scheduling the surgery based on patient's preference. Venipuncture: Labs drawn for Surgical  profile.   This visit note has been electronically signed off by Jed Limerick, DPM.

## 2014-11-12 ENCOUNTER — Encounter (HOSPITAL_BASED_OUTPATIENT_CLINIC_OR_DEPARTMENT_OTHER): Payer: Self-pay | Admitting: *Deleted

## 2014-11-12 NOTE — Progress Notes (Addendum)
NPO AFTER MN. ARRIVE AT 0800. CURRENT LAB RESULTS IN CHART AND EKG IN CHART AND EPIC. WILL TAKE GABAPENTIN, EFFEXOR, HYDROXYZINE, SYNTHROID, AND LOPID  AM DOS W/ SIPS OF WATER  CALLED AND SPOKE W/ PT ABOUT NEW ARRIVAL TIME AT 0700, SHE VERBALIZED UNDERSTANDING.

## 2014-11-14 ENCOUNTER — Encounter (HOSPITAL_BASED_OUTPATIENT_CLINIC_OR_DEPARTMENT_OTHER): Payer: Self-pay | Admitting: Anesthesiology

## 2014-11-14 NOTE — Anesthesia Preprocedure Evaluation (Addendum)
Anesthesia Evaluation  Patient identified by MRN, date of birth, ID band Patient awake    Reviewed: Allergy & Precautions, NPO status , Patient's Chart, lab work & pertinent test results  Airway Mallampati: II  TM Distance: >3 FB Neck ROM: Full    Dental no notable dental hx.    Pulmonary sleep apnea and Continuous Positive Airway Pressure Ventilation , former smoker,  breath sounds clear to auscultation  Pulmonary exam normal       Cardiovascular hypertension, Pt. on medications Rhythm:Regular Rate:Normal     Neuro/Psych PSYCHIATRIC DISORDERS Depression Peripheral neuropathy.  Neuromuscular disease    GI/Hepatic negative GI ROS, Neg liver ROS,   Endo/Other  diabetes, Type 2, Oral Hypoglycemic Agents, Insulin DependentHypothyroidism Morbid obesity  Renal/GU negative Renal ROS  negative genitourinary   Musculoskeletal negative musculoskeletal ROS (+)   Abdominal (+) + obese,   Peds negative pediatric ROS (+)  Hematology negative hematology ROS (+)   Anesthesia Other Findings   Reproductive/Obstetrics negative OB ROS                           Anesthesia Physical Anesthesia Plan  ASA: III  Anesthesia Plan: MAC   Post-op Pain Management:    Induction: Intravenous  Airway Management Planned:   Additional Equipment:   Intra-op Plan:   Post-operative Plan: Extubation in OR  Informed Consent: I have reviewed the patients History and Physical, chart, labs and discussed the procedure including the risks, benefits and alternatives for the proposed anesthesia with the patient or authorized representative who has indicated his/her understanding and acceptance.   Dental advisory given  Plan Discussed with: CRNA  Anesthesia Plan Comments: (Dr. Elby Showers states MAC will be fine.  Will use patient's CPAP mask.)       Anesthesia Quick Evaluation

## 2014-11-15 ENCOUNTER — Ambulatory Visit (HOSPITAL_BASED_OUTPATIENT_CLINIC_OR_DEPARTMENT_OTHER): Payer: 59 | Admitting: Anesthesiology

## 2014-11-15 ENCOUNTER — Ambulatory Visit (HOSPITAL_BASED_OUTPATIENT_CLINIC_OR_DEPARTMENT_OTHER)
Admission: RE | Admit: 2014-11-15 | Discharge: 2014-11-15 | Disposition: A | Discharge status: Home / Self Care | Payer: 59 | Source: Ambulatory Visit | Attending: Podiatry | Admitting: Podiatry

## 2014-11-15 ENCOUNTER — Encounter (HOSPITAL_BASED_OUTPATIENT_CLINIC_OR_DEPARTMENT_OTHER)
Admission: RE | Disposition: A | Discharge status: Home / Self Care | Payer: Self-pay | Source: Ambulatory Visit | Attending: Podiatry

## 2014-11-15 ENCOUNTER — Encounter (HOSPITAL_BASED_OUTPATIENT_CLINIC_OR_DEPARTMENT_OTHER): Payer: Self-pay | Admitting: *Deleted

## 2014-11-15 DIAGNOSIS — E039 Hypothyroidism, unspecified: Secondary | ICD-10-CM | POA: Diagnosis not present

## 2014-11-15 DIAGNOSIS — Z79899 Other long term (current) drug therapy: Secondary | ICD-10-CM | POA: Insufficient documentation

## 2014-11-15 DIAGNOSIS — Z794 Long term (current) use of insulin: Secondary | ICD-10-CM | POA: Diagnosis not present

## 2014-11-15 DIAGNOSIS — Y999 Unspecified external cause status: Secondary | ICD-10-CM | POA: Insufficient documentation

## 2014-11-15 DIAGNOSIS — S93492A Sprain of other ligament of left ankle, initial encounter: Secondary | ICD-10-CM | POA: Diagnosis not present

## 2014-11-15 DIAGNOSIS — Z9989 Dependence on other enabling machines and devices: Secondary | ICD-10-CM | POA: Insufficient documentation

## 2014-11-15 DIAGNOSIS — M216X2 Other acquired deformities of left foot: Secondary | ICD-10-CM

## 2014-11-15 DIAGNOSIS — G4730 Sleep apnea, unspecified: Secondary | ICD-10-CM | POA: Diagnosis not present

## 2014-11-15 DIAGNOSIS — X58XXXA Exposure to other specified factors, initial encounter: Secondary | ICD-10-CM | POA: Insufficient documentation

## 2014-11-15 DIAGNOSIS — Z792 Long term (current) use of antibiotics: Secondary | ICD-10-CM | POA: Insufficient documentation

## 2014-11-15 DIAGNOSIS — Y929 Unspecified place or not applicable: Secondary | ICD-10-CM | POA: Insufficient documentation

## 2014-11-15 DIAGNOSIS — Z6841 Body Mass Index (BMI) 40.0 and over, adult: Secondary | ICD-10-CM | POA: Insufficient documentation

## 2014-11-15 DIAGNOSIS — S9302XA Subluxation of left ankle joint, initial encounter: Secondary | ICD-10-CM | POA: Diagnosis not present

## 2014-11-15 DIAGNOSIS — E119 Type 2 diabetes mellitus without complications: Secondary | ICD-10-CM | POA: Insufficient documentation

## 2014-11-15 DIAGNOSIS — F329 Major depressive disorder, single episode, unspecified: Secondary | ICD-10-CM | POA: Insufficient documentation

## 2014-11-15 DIAGNOSIS — Z791 Long term (current) use of non-steroidal anti-inflammatories (NSAID): Secondary | ICD-10-CM | POA: Diagnosis not present

## 2014-11-15 DIAGNOSIS — I10 Essential (primary) hypertension: Secondary | ICD-10-CM | POA: Insufficient documentation

## 2014-11-15 DIAGNOSIS — Y939 Activity, unspecified: Secondary | ICD-10-CM | POA: Diagnosis not present

## 2014-11-15 DIAGNOSIS — Z87891 Personal history of nicotine dependence: Secondary | ICD-10-CM | POA: Diagnosis not present

## 2014-11-15 DIAGNOSIS — M25572 Pain in left ankle and joints of left foot: Secondary | ICD-10-CM | POA: Diagnosis present

## 2014-11-15 HISTORY — DX: Essential (primary) hypertension: I10

## 2014-11-15 HISTORY — DX: Hyperlipidemia, unspecified: E78.5

## 2014-11-15 HISTORY — PX: ANTERIOR TALOFIBULAR LIGAMENT REPAIR: SHX6471

## 2014-11-15 LAB — GLUCOSE, CAPILLARY
Glucose-Capillary: 64 mg/dL — ABNORMAL LOW (ref 70–99)
Glucose-Capillary: 85 mg/dL (ref 70–99)
Glucose-Capillary: 107 mg/dL — ABNORMAL HIGH (ref 70–99)

## 2014-11-15 SURGERY — REPAIR, LIGAMENT, ANTERIOR TALOFIBULAR
Anesthesia: Monitor Anesthesia Care | Site: Ankle | Laterality: Left

## 2014-11-15 MED ORDER — MIDAZOLAM HCL 5 MG/5ML IJ SOLN
INTRAMUSCULAR | Status: DC | PRN
  Administered 2014-11-15 (×2): 1 mg via INTRAVENOUS

## 2014-11-15 MED ORDER — FENTANYL CITRATE 0.05 MG/ML IJ SOLN
INTRAMUSCULAR | Status: AC
  Filled 2014-11-15: qty 4

## 2014-11-15 MED ORDER — ACETAMINOPHEN 10 MG/ML IV SOLN
INTRAVENOUS | Status: DC | PRN
  Administered 2014-11-15: 1000 mg via INTRAVENOUS

## 2014-11-15 MED ORDER — LIDOCAINE HCL (CARDIAC) 20 MG/ML IV SOLN
INTRAVENOUS | Status: DC | PRN
  Administered 2014-11-15: 50 mg via INTRAVENOUS

## 2014-11-15 MED ORDER — SODIUM CHLORIDE 0.9 % IR SOLN
Status: DC | PRN
  Administered 2014-11-15: 500 mL

## 2014-11-15 MED ORDER — BUPIVACAINE-EPINEPHRINE 0.5% -1:200000 IJ SOLN
INTRAMUSCULAR | Status: DC | PRN
  Administered 2014-11-15: 30 mL

## 2014-11-15 MED ORDER — CIPROFLOXACIN IN D5W 400 MG/200ML IV SOLN
INTRAVENOUS | Status: AC
  Filled 2014-11-15: qty 200

## 2014-11-15 MED ORDER — LACTATED RINGERS IV SOLN
INTRAVENOUS | Status: DC
  Administered 2014-11-15: 08:00:00 via INTRAVENOUS
  Filled 2014-11-15: qty 1000

## 2014-11-15 MED ORDER — CIPROFLOXACIN IN D5W 400 MG/200ML IV SOLN
400.0000 mg | INTRAVENOUS | Status: AC
  Administered 2014-11-15: 400 mg via INTRAVENOUS
  Filled 2014-11-15: qty 200

## 2014-11-15 MED ORDER — PROPOFOL 10 MG/ML IV EMUL
INTRAVENOUS | Status: DC | PRN
  Administered 2014-11-15: 50 ug/kg/min via INTRAVENOUS

## 2014-11-15 MED ORDER — FENTANYL CITRATE 0.05 MG/ML IJ SOLN
25.0000 ug | INTRAMUSCULAR | Status: DC | PRN
  Filled 2014-11-15: qty 1

## 2014-11-15 MED ORDER — FENTANYL CITRATE 0.05 MG/ML IJ SOLN
INTRAMUSCULAR | Status: DC | PRN
  Administered 2014-11-15 (×2): 50 ug via INTRAVENOUS

## 2014-11-15 MED ORDER — MIDAZOLAM HCL 2 MG/2ML IJ SOLN
INTRAMUSCULAR | Status: AC
  Filled 2014-11-15: qty 2

## 2014-11-15 MED ORDER — DEXTROSE 50 % IV SOLN
25.0000 mL | Freq: Once | INTRAVENOUS | Status: AC
  Administered 2014-11-15: 25 mL via INTRAVENOUS
  Filled 2014-11-15: qty 50

## 2014-11-15 MED ORDER — DEXTROSE 50 % IV SOLN
INTRAVENOUS | Status: DC | PRN
  Administered 2014-11-15: 12.5 g via INTRAVENOUS

## 2014-11-15 MED ORDER — DEXTROSE 50 % IV SOLN
INTRAVENOUS | Status: AC
  Filled 2014-11-15: qty 50

## 2014-11-15 MED ORDER — OXYCODONE HCL 5 MG PO TABS
ORAL_TABLET | ORAL | Status: AC
  Filled 2014-11-15: qty 1

## 2014-11-15 MED ORDER — KETOROLAC TROMETHAMINE 30 MG/ML IJ SOLN
INTRAMUSCULAR | Status: DC | PRN
  Administered 2014-11-15: 30 mg via INTRAVENOUS

## 2014-11-15 MED ORDER — PROMETHAZINE HCL 25 MG/ML IJ SOLN
6.2500 mg | INTRAMUSCULAR | Status: DC | PRN
  Filled 2014-11-15: qty 1

## 2014-11-15 MED ORDER — OXYCODONE HCL 5 MG PO TABS
5.0000 mg | ORAL_TABLET | Freq: Once | ORAL | Status: AC
  Administered 2014-11-15: 5 mg via ORAL
  Filled 2014-11-15: qty 1

## 2014-11-15 MED ORDER — LIDOCAINE HCL 1 % IJ SOLN
INTRAMUSCULAR | Status: DC | PRN
  Administered 2014-11-15: 20 mL

## 2014-11-15 SURGICAL SUPPLY — 45 items
BLADE SURG 15 STRL LF DISP TIS (BLADE) ×1 IMPLANT
BLADE SURG 15 STRL SS (BLADE) ×6
BNDG CMPR 9X4 STRL LF SNTH (GAUZE/BANDAGES/DRESSINGS) ×1
BNDG COHESIVE 3X5 TAN STRL LF (GAUZE/BANDAGES/DRESSINGS) ×3 IMPLANT
BNDG CONFORM 3 STRL LF (GAUZE/BANDAGES/DRESSINGS) ×3 IMPLANT
BNDG ESMARK 4X9 LF (GAUZE/BANDAGES/DRESSINGS) ×2 IMPLANT
CHLORAPREP W/TINT 26ML (MISCELLANEOUS) ×3 IMPLANT
COVER TABLE BACK 60X90 (DRAPES) ×3 IMPLANT
CUFF TOURNIQUET SINGLE 34IN LL (TOURNIQUET CUFF) ×2 IMPLANT
DRAPE EXTREMITY T 121X128X90 (DRAPE) ×3 IMPLANT
DRAPE LG THREE QUARTER DISP (DRAPES) ×3 IMPLANT
DRAPE OEC MINIVIEW 54X84 (DRAPES) ×2 IMPLANT
DRAPE SURG 17X23 STRL (DRAPES) ×4 IMPLANT
ELECT REM PT RETURN 9FT ADLT (ELECTROSURGICAL) ×3
ELECTRODE REM PT RTRN 9FT ADLT (ELECTROSURGICAL) ×1 IMPLANT
GAUZE XEROFORM 1X8 LF (GAUZE/BANDAGES/DRESSINGS) ×3 IMPLANT
GLOVE BIO SURGEON STRL SZ 6.5 (GLOVE) ×2 IMPLANT
GLOVE BIO SURGEONS STRL SZ 6.5 (GLOVE) ×2
GLOVE INDICATOR 6.5 STRL GRN (GLOVE) ×4 IMPLANT
GLOVE INDICATOR 7.5 STRL GRN (GLOVE) ×2 IMPLANT
GLOVE SURG SS PI 8.0 STRL IVOR (GLOVE) ×6 IMPLANT
GOWN STRL REUS W/ TWL LRG LVL3 (GOWN DISPOSABLE) IMPLANT
GOWN STRL REUS W/ TWL XL LVL3 (GOWN DISPOSABLE) IMPLANT
GOWN STRL REUS W/TWL LRG LVL3 (GOWN DISPOSABLE) ×6
GOWN STRL REUS W/TWL XL LVL3 (GOWN DISPOSABLE) ×3
KIT INTERNALBRACE LIGA AUGMENT (Orthopedic Implant) ×2 IMPLANT
NDL HYPO 25X1 1.5 SAFETY (NEEDLE) IMPLANT
NEEDLE HYPO 25X1 1.5 SAFETY (NEEDLE) ×6 IMPLANT
PACK BASIN DAY SURGERY FS (CUSTOM PROCEDURE TRAY) ×3 IMPLANT
PADDING CAST ABS 4INX4YD NS (CAST SUPPLIES) ×2
PADDING CAST ABS COTTON 4X4 ST (CAST SUPPLIES) ×1 IMPLANT
PENCIL BUTTON HOLSTER BLD 10FT (ELECTRODE) ×2 IMPLANT
SPONGE GAUZE 4X4 12PLY STER LF (GAUZE/BANDAGES/DRESSINGS) ×2 IMPLANT
STOCKINETTE 4X48 STRL (DRAPES) ×3 IMPLANT
SUCTION FRAZIER TIP 10 FR DISP (SUCTIONS) ×2 IMPLANT
SUT ETHILON 4 0 PS 2 18 (SUTURE) ×3 IMPLANT
SUT VIC AB 3-0 SH 27 (SUTURE) ×3
SUT VIC AB 3-0 SH 27X BRD (SUTURE) IMPLANT
SUT VICRYL 4-0 PS2 18IN ABS (SUTURE) ×2 IMPLANT
SYR BULB 3OZ (MISCELLANEOUS) ×3 IMPLANT
SYR CONTROL 10ML LL (SYRINGE) ×6 IMPLANT
TOWEL OR 17X24 6PK STRL BLUE (TOWEL DISPOSABLE) ×3 IMPLANT
TUBE CONNECTING 12'X1/4 (SUCTIONS) ×1
TUBE CONNECTING 12X1/4 (SUCTIONS) ×1 IMPLANT
UNDERPAD 30X30 INCONTINENT (UNDERPADS AND DIAPERS) ×3 IMPLANT

## 2014-11-15 NOTE — Discharge Instructions (Signed)
Podiatry Postoperative Discharge Instructions Dr. Elby Showers  1.  Day of Surgery: Please have your prescription (s) filled immediately upon leaving the surgery center if you have not already done so. Elevate both feet in the car on the way home. Please go directly to bed and keep your feet elevated by putting two pillows under your feet and one pillow under your knees. Keep your feet out from under the blanket.  2.  Discomfort and Swelling: Your foot may be numb for the remainder of the day. Swelling is expected. In some cases the skin of the foot or the leg may take on a bruised, black and blue appearance.  3.  Temperature: Take your temperature on the 2nd, 3rd, and 4th days after your surgery at 5pm. If your temperature is above 101 degrees please give your physician a call.  4.  Bleeding: A slight amount of drainage on the dressing is normal. Resting your foot in an elevated position will limit bleeding. If bleeding continues, wrap a towel around your foot and apply an ice pack.  5.  Dressing: Keep the bandage absolutely dry and do not remove the dressing. If the dressing becomes wet or soiled, call your physician's office immediately.  6.  Stitches: The stitches will remain in place for 2-3 weeks, depending on the nature of your operation. Slight pulling sensations may be felt due to the stitches. This is a normal occurrence.  7.  Ice: Apply a well-sealed ice pack to your foot for 30 minutes of every hour for the first 24 hours after your surgery. This means 30 minutes on and 30 minutes off, each hour while awake. Do not leave the ice pack in place at bedtime or during long naps. If the ice you are using becomes wet on the outside as the ice melts, place a washcloth between the ice pack and the dressing.  8.  Shoes: Wear your postoperative shoe or boot anytime you put weight on your feet, even if it is just to walk to the bathroom and back. Remove boot or surgical shoe while non-weightbearing for  gentle range of motion exercises at the knee and gas pedal motion at the ankle and wiggling toes several times per day unless otherwise instructed.  It will be at least 2-3 weeks before you can wear your regular shoes again. Please do not wear anything other than your postoperative shoe until told to do so by your physician.  9.  Diet: Return to your regular diet slowly within 24 hours. If you received sedation, your first meal should be light. Do not eat greasy or spicy foods for the first meal. Drink large quantities of liquids, especially water, citrus, and other fruit juices. Please call if unable to keep fluids down.  10. General Activities: Recovery is a gradual process; however, you should feel better with each passing day. On the first day, only leave the bed to go to the bathroom. Gradually increase your activity after the first day. For the first week or two, resting each day is important; strenuous work, heavy lifting, and excessive social activities should be avoided.  11. Post-operative Care: The post-operative care period lasts for approximately six weeks. During this time, periodic visits to your physician's office will be required so your healing process can be monitored closely. It is essential for your future health that you continue to be monitored by your physician until you are discharged and completely healed. Care during this time is the most important part of  your recovery process.  12. Recovery from Anesthesia: You may feel drowsy and your reflexes may be slowed for 24 hours. Do not drive, use machinery, appliances, or ride bicycles or scooters. Do not make important decisions.  13. Complications: Please call your physician following your surgery if you have any of the following complications:      1. Severe pain unrelieved by medication      2. Excessive, heavy, or prolonged bleeding     3. Dizziness or fainting     4. Soiled or wet dressings     5. Temperature over 101.0  degrees   Dr. Elby Showers, [facility] [place] Office: [phone] Answering Service: [phone]  Use crutches or walker and cam walker until further notice.     Post Anesthesia Home Care Instructions  Activity: Get plenty of rest for the remainder of the day. A responsible adult should stay with you for 24 hours following the procedure.  For the next 24 hours, DO NOT: -Drive a car -Paediatric nurse -Drink alcoholic beverages -Take any medication unless instructed by your physician -Make any legal decisions or sign important papers.  Meals: Start with liquid foods such as gelatin or soup. Progress to regular foods as tolerated. Avoid greasy, spicy, heavy foods. If nausea and/or vomiting occur, drink only clear liquids until the nausea and/or vomiting subsides. Call your physician if vomiting continues.  Special Instructions/Symptoms: Your throat may feel dry or sore from the anesthesia or the breathing tube placed in your throat during surgery. If this causes discomfort, gargle with warm salt water. The discomfort should disappear within 24 hours.

## 2014-11-15 NOTE — Anesthesia Procedure Notes (Signed)
Date/Time: 11/15/2014 9:48 AM Performed by: Bethena Roys T Comments: Pt positioned semisitting, personal CPAP machine on with supplemental O2

## 2014-11-15 NOTE — Op Note (Signed)
11/15/2014  10:56 AM  PATIENT:  [name]  39 y.o. female  PRE-OPERATIVE DIAGNOSIS:  SUBLUXATION LEFT ANKLE,ARTHRALGIA  POST-OPERATIVE DIAGNOSIS:  SUBLUXATION LEFT ANKLE,ARTHRALGIA  PROCEDURE:  Procedure(s): ANTERIOR TALOFIBULAR LIGAMENT REPAIR (Left)  SURGEON:  Surgeon(s) and Role:    * Jana Half, DPM - Primary  PHYSICIAN ASSISTANT:   ASSISTANTS: none   ANESTHESIA:   IV sedation  EBL:  Total I/O In: 300 [I.V.:300] Out: -   BLOOD ADMINISTERED:none  DRAINS: none   LOCAL MEDICATIONS USED:  MARCAINE   , LIDOCAINE  and OTHER 30 mL of Marcaine with epinephrine and 20 mL of lidocaine plain  SPECIMEN:  No Specimen  DISPOSITION OF SPECIMEN:  N/A  COUNTS:  YES  TOURNIQUET:   Total Tourniquet Time Documented: Thigh (Left) - 46 minutes Total: Thigh (Left) - 46 minutes   DICTATION: .Viviann Spare Dictation  PLAN OF CARE: Discharge to home after PACU  PATIENT DISPOSITION:  PACU - hemodynamically stable.   Delay start of Pharmacological VTE agent (>24hrs) due to surgical blood loss or risk of bleeding: not applicable  SURGICAL INDICATIONS:  Patient is here for surgical intervention and surgery is further discussed today based on our in-office consultation.  All questions were answered and consent is signed and in the chart outlining risks versus benefits.  The surgical site is marked today and I reviewed the planned procedure(s) with the patient today in preoperative holding area.  Patient has an unstable left ankle and is requesting surgery at this time we will go ahead and plan on an ankle repair using an artificial ligament.  PROCEDURES PERFORMED:  Patient is transported to the operating room and the surgical site is prepped and draped in the usual sterile manner.  She is brought back and placed in a supine position, thigh tourniquet inflated to 350 mmHg.  Lateral ankle ligament repair anterior talofibular ligament repair left ankle using Arthrex internal brace system.  Linear incision made over the distal fibula dissected down through subcutaneous tissue with sharp and blunt dissection ligating bleeders and tying off a large vessel along the way. Deep fascia is excised capsule is incised and reflected medially and laterally exposing the ankle joint into the surgical site. Used the Arthrex guidewire into the fibula and then into the talus and confirmed on fluoroscopy good location of the guidewire. We then used a cannulated screw 2.7 mm into the fibula driven from distal anterior to proximal posterior about 1.5 cm from the tip of the lateral malleolus. Then drilled using a 3.4 mm drill into the talus angled about 45 away from the talar dome and away from the canalis tarsi. We then used a tap into both of these locations and then put the anchor into the fibula using the 3.5 mm anchor. We then used the nonabsorbable suture from the anchor and measured to proper length and attached this to the anchor for the talus and then drilled into the talus. There was good stability at the ankle with anterior drawer and talar tilt. Wound is flushed with sterile antibiotic solution capsule and anterior talofibular ligament repaired using 3-0 Vicryl, deep fascia and adipose tissue repaired using 4-0 Vicryl. Skin is repaired with 4-0 Vicryl and 4-0 nylon. Xeroform gauze applied wet-to-dry dressing applied, tourniquet deflated noting good capillary refill time. Patient is placed in a Jones compression cast with cast padding stockinette and Coban. No complications.  Patient returns to PACU having tolerated the procedure and anesthesia well.  Patient is given written and oral post  op instructions.  Patient will follow up in my office as scheduled for a post op appointment.

## 2014-11-15 NOTE — Transfer of Care (Signed)
Immediate Anesthesia Transfer of Care Note  Patient: [name]  Procedure(s) Performed: Procedure(s): ANTERIOR TALOFIBULAR LIGAMENT REPAIR (Left)  Patient Location: PACU  Anesthesia Type:MAC  Level of Consciousness: awake, alert  and oriented  Airway & Oxygen Therapy: Patient Spontanous Breathing  Post-op Assessment: Report given to RN  Post vital signs: Reviewed and stable  Last Vitals:  Filed Vitals:   11/15/14 0706  BP: 126/71  Pulse: 95  Temp: 36.4 C  Resp: 18    Complications: No apparent anesthesia complications

## 2014-11-15 NOTE — Anesthesia Postprocedure Evaluation (Signed)
  Anesthesia Post-op Note  Patient: [name]  Procedure(s) Performed: Procedure(s) (LRB): ANTERIOR TALOFIBULAR LIGAMENT REPAIR (Left)  Patient Location: PACU  Anesthesia Type: MAC  Level of Consciousness: awake and alert   Airway and Oxygen Therapy: Patient Spontanous Breathing  Post-op Pain: mild  Post-op Assessment: Post-op Vital signs reviewed, Patient's Cardiovascular Status Stable, Respiratory Function Stable, Patent Airway and No signs of Nausea or vomiting  Last Vitals:  Filed Vitals:   11/15/14 1259  BP: 101/58  Pulse: 89  Temp: 36.4 C  Resp: 18    Post-op Vital Signs: stable   Complications: No apparent anesthesia complications. No sedation, and no apnea in recovery room. She will use CPAP with sleeping/napping today. No complaints.

## 2014-11-15 NOTE — H&P (Signed)
  Discussed the surgery today and reviewed H&P.  She wishes to proceed with surgery as planned.

## 2014-11-16 ENCOUNTER — Encounter (HOSPITAL_BASED_OUTPATIENT_CLINIC_OR_DEPARTMENT_OTHER): Payer: Self-pay | Admitting: Podiatry

## 2014-11-26 ENCOUNTER — Emergency Department (INDEPENDENT_AMBULATORY_CARE_PROVIDER_SITE_OTHER)
Admission: EM | Admit: 2014-11-26 | Discharge: 2014-11-26 | Disposition: A | Discharge status: Home / Self Care | Payer: 59 | Source: Home / Self Care | Attending: Emergency Medicine | Admitting: Emergency Medicine

## 2014-11-26 ENCOUNTER — Encounter (HOSPITAL_COMMUNITY): Payer: Self-pay

## 2014-11-26 DIAGNOSIS — J069 Acute upper respiratory infection, unspecified: Secondary | ICD-10-CM

## 2014-11-26 DIAGNOSIS — J4530 Mild persistent asthma, uncomplicated: Secondary | ICD-10-CM | POA: Diagnosis not present

## 2014-11-26 MED ORDER — IPRATROPIUM-ALBUTEROL 0.5-2.5 (3) MG/3ML IN SOLN
3.0000 mL | Freq: Once | RESPIRATORY_TRACT | Status: AC
  Administered 2014-11-26: 3 mL via RESPIRATORY_TRACT

## 2014-11-26 MED ORDER — PREDNISONE 10 MG PO TABS: 20.0000 mg | ORAL_TABLET | Freq: Every day | ORAL | Status: DC

## 2014-11-26 MED ORDER — ALBUTEROL SULFATE HFA 108 (90 BASE) MCG/ACT IN AERS: 2.0000 | INHALATION_SPRAY | RESPIRATORY_TRACT | Status: DC | PRN

## 2014-11-26 MED ORDER — ALBUTEROL SULFATE (2.5 MG/3ML) 0.083% IN NEBU
2.5000 mg | INHALATION_SOLUTION | Freq: Once | RESPIRATORY_TRACT | Status: AC
  Administered 2014-11-26: 2.5 mg via RESPIRATORY_TRACT

## 2014-11-26 MED ORDER — IPRATROPIUM-ALBUTEROL 0.5-2.5 (3) MG/3ML IN SOLN
RESPIRATORY_TRACT | Status: AC
  Filled 2014-11-26: qty 3

## 2014-11-26 NOTE — ED Provider Notes (Signed)
CSN: [id number]     Arrival date & time 11/26/14  1841 History   First MD Initiated Contact with Patient 11/26/14 2001     Chief Complaint  Patient presents with  . Cough   (Consider location/radiation/quality/duration/timing/severity/associated sxs/prior Treatment) HPI Comments: 39 year old female who is severely and morbidly obese is complaining of sinus congestion, cough and wheeze for 5 days. She denies having fever. She does have chest congestion and tightness. She has type 2 diabetes mellitus and was told that she could not take regular cold medicines for that reason.   Past Medical History  Diagnosis Date  . Diabetes mellitus type II   . Abdominal wall hernia     mid-abdomen  . OSA on CPAP     SEVERE OSA PER STUDY 01-24-2005  . Major depression, recurrent, chronic   . Generalized anxiety disorder   . Arthralgia of ankle, left     stress foot fx  . Hyperlipidemia   . History of seizures as a child   . 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 Scoliosis of thoracic spine   . Hypertension   . Hypothyroidism   . Wears glasses    Past Surgical History  Procedure Laterality Date  . Cesarean section  dec 1997/  06-03-2001/   01-01-2005    BILATERAL TUBAL LIGATION WITH LAST ONE  . Partial knee arthroplasty Right 04/19/2014    Procedure: RIGHT UNI KNEE ARTHROPLASTY MEDIALLY ;  Surgeon: Mauri Pole, MD;  Location: [facility];  Service: Orthopedics;  Laterality: Right;  . Laparoscopic cholecystectomy  09-25-2005  . Dilation and curettage of uterus  1995    WITH SUCTION  . Anterior talofibular ligament repair Left 11/15/2014    Procedure: ANTERIOR TALOFIBULAR LIGAMENT REPAIR;  Surgeon: Jana Half, DPM;  Location: [facility];  Service: Podiatry;  Laterality: Left;   Family History  Problem Relation Age of Onset  . Adopted: Yes  . Autism Son   . ADD / ADHD Son   . Apraxia Son    History  Substance Use Topics  . Smoking  status: Former Smoker -- 4 years    Types: Cigarettes    Quit date: 07/25/1999  . Smokeless tobacco: Never Used  . Alcohol Use: Yes     Comment: RARE   OB History    No data available     Review of Systems  Constitutional: Positive for activity change. Negative for fever, chills, appetite change and fatigue.  HENT: Positive for congestion, postnasal drip and rhinorrhea. Negative for facial swelling and sore throat.   Eyes: Negative.   Respiratory: Positive for cough, shortness of breath and wheezing.   Cardiovascular: Positive for chest pain.  Gastrointestinal: Negative.   Genitourinary: Negative.   Musculoskeletal: Negative for neck pain and neck stiffness.  Skin: Negative for pallor and rash.  Neurological: Positive for dizziness.    Allergies  Penicillins  Home Medications   Prior to Admission medications   Medication Sig Start Date End Date Taking? Authorizing Provider  albuterol (PROVENTIL HFA;VENTOLIN HFA) 108 (90 BASE) MCG/ACT inhaler Inhale 2 puffs into the lungs every 4 (four) hours as needed for wheezing or shortness of breath. 11/26/14   Janne Napoleon, NP  canagliflozin (INVOKANA) 300 MG TABS tablet Take 300 mg by mouth daily before breakfast.    Historical Provider, MD  gabapentin (NEURONTIN) 300 MG capsule Take 600 mg by mouth 2 (two) times daily.  Historical Provider, MD  gemfibrozil (LOPID) 600 MG tablet Take 600 mg by mouth 2 (two) times daily before a meal.    Historical Provider, MD  hydrOXYzine (VISTARIL) 50 MG capsule Take 50 mg by mouth 2 (two) times daily. sleep    Historical Provider, MD  insulin NPH-regular Human (NOVOLIN 70/30) (70-30) 100 UNIT/ML injection Inject 80 Units into the skin 2 (two) times daily with a meal.    Historical Provider, MD  levothyroxine (SYNTHROID, LEVOTHROID) 100 MCG tablet Take 50 mcg by mouth every morning.     Historical Provider, MD  pioglitazone (ACTOS) 45 MG tablet Take 45 mg by mouth every morning.    Historical Provider,  MD  predniSONE (DELTASONE) 10 MG tablet Take 2 tablets (20 mg total) by mouth daily. 11/26/14   Janne Napoleon, NP  quinapril (ACCUPRIL) 5 MG tablet Take 2.5 mg by mouth every morning.     Historical Provider, MD  venlafaxine XR (EFFEXOR-XR) 37.5 MG 24 hr capsule Take 37.5 mg by mouth 2 (two) times daily.    Historical Provider, MD  zaleplon (SONATA) 5 MG capsule Take 5 mg by mouth at bedtime as needed for sleep.    Historical Provider, MD   BP 144/78 mmHg  Pulse 105  Temp(Src) 98.3 F (36.8 C) (Oral)  Resp 20  SpO2 97%  LMP 11/12/2014 (Exact Date) Physical Exam  Constitutional: She is oriented to person, place, and time. She appears well-developed. No distress.  HENT:  Mouth/Throat: No oropharyngeal exudate.  Bilateral TMs are normal Oropharynx typical see due to body habitus however no erythema or exudates are seen in the upper portion.  Eyes: EOM are normal.  Neck: Neck supple.  Neck extremely obese and difficult to palpate. No nodules or other masses palpable.  Cardiovascular: Regular rhythm and normal heart sounds.   Pulmonary/Chest: Effort normal.  Breath sounds are nearly absent bilaterally. This most likely is due to body habitus and is well as bronchospasm.  Neurological: She is alert and oriented to person, place, and time.  Skin: Skin is warm and dry.  Psychiatric: She has a normal mood and affect.  Nursing note and vitals reviewed.   ED Course  Procedures (including critical care time) Labs Review Labs Reviewed - No data to display  Imaging Review No results found.   MDM   1. URI (upper respiratory infection)   2. RAD (reactive airway disease) with wheezing, mild persistent, uncomplicated    Post DuoNeb 5 mg/2.5 mg patient states she has less coughing and can breathe easier. AM still unable to auscultate air movement. Most likely due to body habitus. Sudafed PE for congestion Allegra or Zyrtec for drainage  Use lots of saline nasal spray frequently  Prednisone  as directed  Use the albuterol HFA as directed and read the instructions on how to use  Robitussin-DM for cough and to thin secretions  For worsening, new symptoms such as difficulty in breathing, choking, fever or worsening in any way go to the emergency department. Otherwise see your PCP early next week.   Janne Napoleon, NP 11/26/14 2115

## 2014-11-26 NOTE — ED Notes (Signed)
Patient complains of cough and congestion that started five days ago Patient states she has some SOB as well

## 2014-11-26 NOTE — Discharge Instructions (Signed)
Bronchospasm A bronchospasm is a spasm or tightening of the airways going into the lungs. During a bronchospasm breathing becomes more difficult because the airways get smaller. When this happens there can be coughing, a whistling sound when breathing (wheezing), and difficulty breathing. Bronchospasm is often associated with asthma, but not all patients who experience a bronchospasm have asthma. CAUSES  A bronchospasm is caused by inflammation or irritation of the airways. The inflammation or irritation may be triggered by:   Allergies (such as to animals, pollen, food, or mold). Allergens that cause bronchospasm may cause wheezing immediately after exposure or many hours later.   Infection. Viral infections are believed to be the most common cause of bronchospasm.   Exercise.   Irritants (such as pollution, cigarette smoke, strong odors, aerosol sprays, and paint fumes).   Weather changes. Winds increase molds and pollens in the air. Rain refreshes the air by washing irritants out. Cold air may cause inflammation.   Stress and emotional upset.  SIGNS AND SYMPTOMS   Wheezing.   Excessive nighttime coughing.   Frequent or severe coughing with a simple cold.   Chest tightness.   Shortness of breath.  DIAGNOSIS  Bronchospasm is usually diagnosed through a history and physical exam. Tests, such as chest X-rays, are sometimes done to look for other conditions. TREATMENT   Inhaled medicines can be given to open up your airways and help you breathe. The medicines can be given using either an inhaler or a nebulizer machine.  Corticosteroid medicines may be given for severe bronchospasm, usually when it is associated with asthma. HOME CARE INSTRUCTIONS   Always have a plan prepared for seeking medical care. Know when to call your health care provider and local emergency services (911 in the [place] Know where you can access local emergency care.  Only take medicines as  directed by your health care provider.  If you were prescribed an inhaler or nebulizer machine, ask your health care provider to explain how to use it correctly. Always use a spacer with your inhaler if you were given one.  It is necessary to remain calm during an attack. Try to relax and breathe more slowly.  Control your home environment in the following ways:   Change your heating and air conditioning filter at least once a month.   Limit your use of fireplaces and wood stoves.  Do not smoke and do not allow smoking in your home.   Avoid exposure to perfumes and fragrances.   Get rid of pests (such as roaches and mice) and their droppings.   Throw away plants if you see mold on them.   Keep your house clean and dust free.   Replace carpet with wood, tile, or vinyl flooring. Carpet can trap dander and dust.   Use allergy-proof pillows, mattress covers, and box spring covers.   Wash bed sheets and blankets every week in hot water and dry them in a dryer.   Use blankets that are made of polyester or cotton.   Wash hands frequently. SEEK MEDICAL CARE IF:   You have muscle aches.   You have chest pain.   The sputum changes from clear or white to yellow, green, gray, or bloody.   The sputum you cough up gets thicker.   There are problems that may be related to the medicine you are given, such as a rash, itching, swelling, or trouble breathing.  SEEK IMMEDIATE MEDICAL CARE IF:   You have worsening wheezing and coughing even  after taking your prescribed medicines.   You have increased difficulty breathing.   You develop severe chest pain. MAKE SURE YOU:   Understand these instructions.  Will watch your condition.  Will get help right away if you are not doing well or get worse. Document Released: 08/30/2003 Document Revised: 09/01/2013 Document Reviewed: 02/16/2013 [facility] Patient Information 2015 [place], [place]. This information is not  intended to replace advice given to you by your health care provider. Make sure you discuss any questions you have with your health care provider.  How to Use an Inhaler Using your inhaler correctly is very important. Good technique will make sure that the medicine reaches your lungs.  HOW TO USE AN INHALER:  Take the cap off the inhaler.  If this is the first time using your inhaler, you need to prime it. Shake the inhaler for 5 seconds. Release four puffs into the air, away from your face. Ask your doctor for help if you have questions.  Shake the inhaler for 5 seconds.  Turn the inhaler so the bottle is above the mouthpiece.  Put your pointer finger on top of the bottle. Your thumb holds the bottom of the inhaler.  Open your mouth.  Either hold the inhaler away from your mouth (the width of 2 fingers) or place your lips tightly around the mouthpiece. Ask your doctor which way to use your inhaler.  Breathe out as much air as possible.  Breathe in and push down on the bottle 1 time to release the medicine. You will feel the medicine go in your mouth and throat.  Continue to take a deep breath in very slowly. Try to fill your lungs.  After you have breathed in completely, hold your breath for 10 seconds. This will help the medicine to settle in your lungs. If you cannot hold your breath for 10 seconds, hold it for as long as you can before you breathe out.  Breathe out slowly, through pursed lips. Whistling is an example of pursed lips.  If your doctor has told you to take more than 1 puff, wait at least 15-30 seconds between puffs. This will help you get the best results from your medicine. Do not use the inhaler more than your doctor tells you to.  Put the cap back on the inhaler.  Follow the directions from your doctor or from the inhaler package about cleaning the inhaler. If you use more than one inhaler, ask your doctor which inhalers to use and what order to use them in. Ask  your doctor to help you figure out when you will need to refill your inhaler.  If you use a steroid inhaler, always rinse your mouth with water after your last puff, gargle and spit out the water. Do not swallow the water. GET HELP IF:  The inhaler medicine only partially helps to stop wheezing or shortness of breath.  You are having trouble using your inhaler.  You have some increase in thick spit (phlegm). GET HELP RIGHT AWAY IF:  The inhaler medicine does not help your wheezing or shortness of breath or you have tightness in your chest.  You have dizziness, headaches, or fast heart rate.  You have chills, fever, or night sweats.  You have a large increase of thick spit, or your thick spit is bloody. MAKE SURE YOU:   Understand these instructions.  Will watch your condition.   if you are not doing well or get worse. Document Released: 06/05/2008 Document Revised:  06/17/2013 Document Reviewed: 03/26/2013 [employer] Patient Information 2015 [place], [place]. This information is not intended to replace advice given to you by your health care provider. Make sure you discuss any questions you have with your health care provider.  Upper Respiratory Infection, Adult Sudafed PE for congestion Allegra or Zyrtec for drainage  Use lots of saline nasal spray frequently  Prednisone as directed  Use the albuterol HFA as directed and read the instructions on how to use  Robitussin-DM for cough and to thin secretions  An upper respiratory infection (URI) is also sometimes known as the common cold. The upper respiratory tract includes the nose, sinuses, throat, trachea, and bronchi. Bronchi are the airways leading to the lungs. Most people improve within 1 week, but symptoms can last up to 2 weeks. A residual cough may last even longer.  CAUSES Many different viruses can infect the tissues lining the upper respiratory tract. The tissues become irritated and inflamed and often become very moist.  Mucus production is also common. A cold is contagious. You can easily spread the virus to others by oral contact. This includes kissing, sharing a glass, coughing, or sneezing. Touching your mouth or nose and then touching a surface, which is then touched by another person, can also spread the virus. SYMPTOMS  Symptoms typically develop 1 to 3 days after you come in contact with a cold virus. Symptoms vary from person to person. They may include:  Runny nose.  Sneezing.  Nasal congestion.  Sinus irritation.  Sore throat.  Loss of voice (laryngitis).  Cough.  Fatigue.  Muscle aches.  Loss of appetite.  Headache.  Low-grade fever. DIAGNOSIS  You might diagnose your own cold based on familiar symptoms, since most people get a cold 2 to 3 times a year. Your caregiver can confirm this based on your exam. Most importantly, your caregiver can check that your symptoms are not due to another disease such as strep throat, sinusitis, pneumonia, asthma, or epiglottitis. Blood tests, throat tests, and X-rays are not necessary to diagnose a common cold, but they may sometimes be helpful in excluding other more serious diseases. Your caregiver will decide if any further tests are required. RISKS AND COMPLICATIONS  You may be at risk for a more severe case of the common cold if you smoke cigarettes, have chronic heart disease (such as heart failure) or lung disease (such as asthma), or if you have a weakened immune system. The very young and very old are also at risk for more serious infections. Bacterial sinusitis, middle ear infections, and bacterial pneumonia can complicate the common cold. The common cold can worsen asthma and chronic obstructive pulmonary disease (COPD). Sometimes, these complications can require emergency medical care and may be life-threatening. PREVENTION  The best way to protect against getting a cold is to practice good hygiene. Avoid oral or hand contact with people with  cold symptoms. Wash your hands often if contact occurs. There is no clear evidence that vitamin C, vitamin E, echinacea, or exercise reduces the chance of developing a cold. However, it is always recommended to get plenty of rest and practice good nutrition. TREATMENT  Treatment is directed at relieving symptoms. There is no cure. Antibiotics are not effective, because the infection is caused by a virus, not by bacteria. Treatment may include:  Increased fluid intake. Sports drinks offer valuable electrolytes, sugars, and fluids.  Breathing heated mist or steam (vaporizer or shower).  Eating chicken soup or other clear broths, and maintaining good  nutrition.  Getting plenty of rest.  Using gargles or lozenges for comfort.  Controlling fevers with ibuprofen or acetaminophen as directed by your caregiver.  Increasing usage of your inhaler if you have asthma. Zinc gel and zinc lozenges, taken in the first 24 hours of the common cold, can shorten the duration and lessen the severity of symptoms. Pain medicines may help with fever, muscle aches, and throat pain. A variety of non-prescription medicines are available to treat congestion and runny nose. Your caregiver can make recommendations and may suggest nasal or lung inhalers for other symptoms.  HOME CARE INSTRUCTIONS   Only take over-the-counter or prescription medicines for pain, discomfort, or fever as directed by your caregiver.  Use a warm mist humidifier or inhale steam from a shower to increase air moisture. This may keep secretions moist and make it easier to breathe.  Drink enough water and fluids to keep your urine clear or pale yellow.  Rest as needed.  Return to work when your temperature has returned to normal or as your caregiver advises. You may need to stay home longer to avoid infecting others. You can also use a face mask and careful hand washing to prevent spread of the virus. SEEK MEDICAL CARE IF:   After the first  few days, you feel you are getting worse rather than better.  You need your caregiver's advice about medicines to control symptoms.  You develop chills, worsening shortness of breath, or brown or red sputum. These may be signs of pneumonia.  You develop yellow or brown nasal discharge or pain in the face, especially when you bend forward. These may be signs of sinusitis.  You develop a fever, swollen neck glands, pain with swallowing, or white areas in the back of your throat. These may be signs of strep throat. SEEK IMMEDIATE MEDICAL CARE IF:   You have a fever.  You develop severe or persistent headache, ear pain, sinus pain, or chest pain.  You develop wheezing, a prolonged cough, cough up blood, or have a change in your usual mucus (if you have chronic lung disease).  You develop sore muscles or a stiff neck. Document Released: 02/20/2001 Document Revised: 11/19/2011 Document Reviewed: 12/02/2013 [facility] Patient Information 2015 [place], [place]. This information is not intended to replace advice given to you by your health care provider. Make sure you discuss any questions you have with your health care provider.

## 2014-12-12 ENCOUNTER — Emergency Department (HOSPITAL_COMMUNITY)
Admission: EM | Admit: 2014-12-12 | Discharge: 2014-12-12 | Disposition: A | Discharge status: Home / Self Care | Payer: 59 | Attending: Emergency Medicine | Admitting: Emergency Medicine

## 2014-12-12 ENCOUNTER — Emergency Department (HOSPITAL_COMMUNITY): Payer: 59

## 2014-12-12 ENCOUNTER — Encounter (HOSPITAL_COMMUNITY): Payer: Self-pay | Admitting: Emergency Medicine

## 2014-12-12 DIAGNOSIS — Z792 Long term (current) use of antibiotics: Secondary | ICD-10-CM | POA: Insufficient documentation

## 2014-12-12 DIAGNOSIS — I10 Essential (primary) hypertension: Secondary | ICD-10-CM | POA: Diagnosis not present

## 2014-12-12 DIAGNOSIS — Q212 Atrioventricular septal defect: Secondary | ICD-10-CM | POA: Diagnosis not present

## 2014-12-12 DIAGNOSIS — E119 Type 2 diabetes mellitus without complications: Secondary | ICD-10-CM | POA: Insufficient documentation

## 2014-12-12 DIAGNOSIS — F411 Generalized anxiety disorder: Secondary | ICD-10-CM | POA: Insufficient documentation

## 2014-12-12 DIAGNOSIS — Z794 Long term (current) use of insulin: Secondary | ICD-10-CM | POA: Diagnosis not present

## 2014-12-12 DIAGNOSIS — Z87891 Personal history of nicotine dependence: Secondary | ICD-10-CM | POA: Insufficient documentation

## 2014-12-12 DIAGNOSIS — Z791 Long term (current) use of non-steroidal anti-inflammatories (NSAID): Secondary | ICD-10-CM | POA: Insufficient documentation

## 2014-12-12 DIAGNOSIS — G40909 Epilepsy, unspecified, not intractable, without status epilepticus: Secondary | ICD-10-CM | POA: Insufficient documentation

## 2014-12-12 DIAGNOSIS — Z79899 Other long term (current) drug therapy: Secondary | ICD-10-CM | POA: Insufficient documentation

## 2014-12-12 DIAGNOSIS — Z3202 Encounter for pregnancy test, result negative: Secondary | ICD-10-CM | POA: Insufficient documentation

## 2014-12-12 DIAGNOSIS — G4733 Obstructive sleep apnea (adult) (pediatric): Secondary | ICD-10-CM | POA: Diagnosis not present

## 2014-12-12 DIAGNOSIS — F319 Bipolar disorder, unspecified: Secondary | ICD-10-CM | POA: Insufficient documentation

## 2014-12-12 DIAGNOSIS — M419 Scoliosis, unspecified: Secondary | ICD-10-CM | POA: Insufficient documentation

## 2014-12-12 DIAGNOSIS — Z8719 Personal history of other diseases of the digestive system: Secondary | ICD-10-CM | POA: Insufficient documentation

## 2014-12-12 DIAGNOSIS — Z880 Allergy status to penicillin: Secondary | ICD-10-CM | POA: Diagnosis not present

## 2014-12-12 DIAGNOSIS — R0602 Shortness of breath: Secondary | ICD-10-CM | POA: Diagnosis present

## 2014-12-12 DIAGNOSIS — J309 Allergic rhinitis, unspecified: Secondary | ICD-10-CM | POA: Insufficient documentation

## 2014-12-12 DIAGNOSIS — R0600 Dyspnea, unspecified: Secondary | ICD-10-CM

## 2014-12-12 DIAGNOSIS — E039 Hypothyroidism, unspecified: Secondary | ICD-10-CM | POA: Insufficient documentation

## 2014-12-12 LAB — CBC WITH DIFFERENTIAL/PLATELET
BASOS PCT: 0 % (ref 0–1)
Basophils Absolute: 0 10*3/uL (ref 0.0–0.1)
Eosinophils Absolute: 0.1 10*3/uL (ref 0.0–0.7)
Eosinophils Relative: 1 % (ref 0–5)
HEMATOCRIT: 40.9 % (ref 36.0–46.0)
Hemoglobin: 13.3 g/dL (ref 12.0–15.0)
LYMPHS ABS: 1.9 10*3/uL (ref 0.7–4.0)
Lymphocytes Relative: 21 % (ref 12–46)
MCH: 28.1 pg (ref 26.0–34.0)
MCHC: 32.5 g/dL (ref 30.0–36.0)
MCV: 86.5 fL (ref 78.0–100.0)
Monocytes Absolute: 0.5 10*3/uL (ref 0.1–1.0)
Monocytes Relative: 6 % (ref 3–12)
NEUTROS PCT: 72 % (ref 43–77)
Neutro Abs: 6.5 10*3/uL (ref 1.7–7.7)
PLATELETS: 356 10*3/uL (ref 150–400)
RBC: 4.73 MIL/uL (ref 3.87–5.11)
RDW: 14 % (ref 11.5–15.5)
WBC: 9 10*3/uL (ref 4.0–10.5)

## 2014-12-12 LAB — BASIC METABOLIC PANEL
ANION GAP: 9 (ref 5–15)
BUN: 15 mg/dL (ref 6–23)
CALCIUM: 9.2 mg/dL (ref 8.4–10.5)
CO2: 25 mmol/L (ref 19–32)
Chloride: 104 mmol/L (ref 96–112)
Creatinine, Ser: 0.65 mg/dL (ref 0.50–1.10)
GFR calc Af Amer: >90 mL/min (ref >90)
Glucose, Bld: 248 mg/dL — ABNORMAL HIGH (ref 70–99)
Potassium: 4.6 mmol/L (ref 3.5–5.1)
Sodium: 138 mmol/L (ref 135–145)

## 2014-12-12 LAB — TROPONIN I: Troponin I: <0.03 ng/mL (ref <0.031)

## 2014-12-12 LAB — D-DIMER, QUANTITATIVE (NOT AT ARMC): D DIMER QUANT: 0.33 ug{FEU}/mL (ref 0.00–0.48)

## 2014-12-12 LAB — TSH: TSH: 2.199 u[IU]/mL (ref 0.350–4.500)

## 2014-12-12 LAB — HCG, SERUM, QUALITATIVE: PREG SERUM: NEGATIVE

## 2014-12-12 MED ORDER — FLUTICASONE PROPIONATE 50 MCG/ACT NA SUSP: NASAL | Status: DC

## 2014-12-12 MED ORDER — CETIRIZINE HCL 10 MG PO CAPS: 10.0000 mg | ORAL_CAPSULE | Freq: Every day | ORAL | Status: DC

## 2014-12-12 MED ORDER — PREDNISONE 20 MG PO TABS
20.0000 mg | ORAL_TABLET | Freq: Every day | ORAL | Status: DC
Start: 2014-12-12 — End: 2015-02-11

## 2014-12-12 NOTE — ED Notes (Signed)
Pt from home c/o shortness of breath, headache and dizziness. She reports being seen at [facility] on 3/18 for same and was given a breathing treatment. Pt is morbidly obese.

## 2014-12-12 NOTE — Discharge Instructions (Signed)

## 2014-12-12 NOTE — ED Notes (Signed)
Patient transported to X-ray 

## 2014-12-12 NOTE — ED Provider Notes (Signed)
CSN: [id number]     Arrival date & time 12/12/14  69 History   First MD Initiated Contact with Patient 12/12/14 1721     Chief Complaint  Patient presents with  . Shortness of Breath  . Headache     (Consider location/radiation/quality/duration/timing/severity/associated sxs/prior Treatment) HPI  Past Medical History  Diagnosis Date  . Diabetes mellitus type II   . Abdominal wall hernia     mid-abdomen  . OSA on CPAP     SEVERE OSA PER STUDY 01-24-2005  . Major depression, recurrent, chronic   . Generalized anxiety disorder   . Arthralgia of ankle, left     stress foot fx  . Hyperlipidemia   . History of seizures as a child   . 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 Scoliosis of thoracic spine   . Hypertension   . Hypothyroidism   . Wears glasses    Past Surgical History  Procedure Laterality Date  . Cesarean section  dec 1997/  06-03-2001/   01-01-2005    BILATERAL TUBAL LIGATION WITH LAST ONE  . Partial knee arthroplasty Right 04/19/2014    Procedure: RIGHT UNI KNEE ARTHROPLASTY MEDIALLY ;  Surgeon: Mauri Pole, MD;  Location: [facility];  Service: Orthopedics;  Laterality: Right;  . Laparoscopic cholecystectomy  09-25-2005  . Dilation and curettage of uterus  1995    WITH SUCTION  . Anterior talofibular ligament repair Left 11/15/2014    Procedure: ANTERIOR TALOFIBULAR LIGAMENT REPAIR;  Surgeon: Jana Half, DPM;  Location: [place];  Service: Podiatry;  Laterality: Left;   Family History  Problem Relation Age of Onset  . Adopted: Yes  . Autism Son   . ADD / ADHD Son   . Apraxia Son    History  Substance Use Topics  . Smoking status: Former Smoker -- 4 years    Types: Cigarettes    Quit date: 07/25/1999  . Smokeless tobacco: Never Used  . Alcohol Use: Yes     Comment: RARE   OB History    No data available     Review of Systems    Allergies  Penicillins  Home Medications   Prior to  Admission medications   Medication Sig Start Date End Date Taking? Authorizing Provider  albuterol (PROVENTIL HFA;VENTOLIN HFA) 108 (90 BASE) MCG/ACT inhaler Inhale 2 puffs into the lungs every 4 (four) hours as needed for wheezing or shortness of breath. 11/26/14  Yes Janne Napoleon, NP  butalbital-aspirin-caffeine [facility]) 50-325-40 MG per capsule Take 1 capsule by mouth every 6 (six) hours as needed for headache.   Yes Historical Provider, MD  canagliflozin (INVOKANA) 300 MG TABS tablet Take 300 mg by mouth daily before breakfast.   Yes Historical Provider, MD  gabapentin (NEURONTIN) 300 MG capsule Take 600 mg by mouth 2 (two) times daily.    Yes Historical Provider, MD  gemfibrozil (LOPID) 600 MG tablet Take 600 mg by mouth 2 (two) times daily before a meal.   Yes Historical Provider, MD  hydrOXYzine (VISTARIL) 50 MG capsule Take 100 mg by mouth at bedtime. sleep   Yes Historical Provider, MD  indomethacin (INDOCIN) 50 MG capsule Take 50 mg by mouth daily.   Yes Historical Provider, MD  insulin NPH-regular Human (NOVOLIN 70/30) (70-30) 100 UNIT/ML injection Inject 50 Units into the skin 2 (two) times daily with a meal.    Yes Historical Provider, MD  levothyroxine (SYNTHROID,  LEVOTHROID) 100 MCG tablet Take 50 mcg by mouth every morning.    Yes Historical Provider, MD  pioglitazone (ACTOS) 45 MG tablet Take 45 mg by mouth every morning.   Yes Historical Provider, MD  quinapril (ACCUPRIL) 5 MG tablet Take 2.5 mg by mouth every morning.    Yes Historical Provider, MD  venlafaxine XR (EFFEXOR-XR) 37.5 MG 24 hr capsule Take 37.5-75 mg by mouth 2 (two) times daily. Take 2 capsules= 75mg  in the morning and take 1 capsule = 37.5mg  at bedtime   Yes Historical Provider, MD  Zolpidem Tartrate (AMBIEN PO) Take 1 tablet by mouth at bedtime as needed (sleep).   Yes Historical Provider, MD  Cetirizine HCl (ZYRTEC ALLERGY) 10 MG CAPS Take 1 capsule (10 mg total) by mouth daily. 12/12/14   Tanna Furry, MD  fluticasone  Asencion Islam) 50 MCG/ACT nasal spray 1 spray each nares bid 12/12/14   Tanna Furry, MD  predniSONE (DELTASONE) 20 MG tablet Take 1 tablet (20 mg total) by mouth daily with breakfast. 12/12/14   Tanna Furry, MD   BP 119/73 mmHg  Pulse 105  Temp(Src) 98.8 F (37.1 C) (Oral)  Resp 22  SpO2 99%  LMP 10/14/2014 Physical Exam  ED Course  Procedures (including critical care time) Labs Review Labs Reviewed  BASIC METABOLIC PANEL - Abnormal; Notable for the following:    Glucose, Bld 248 (*)    All other components within normal limits  TROPONIN I  TSH  CBC WITH DIFFERENTIAL/PLATELET  D-DIMER, QUANTITATIVE  HCG, SERUM, QUALITATIVE    Imaging Review Dg Chest 2 View (if Patient Has Fever And/or Copd)  12/12/2014   CLINICAL DATA:  Shortness of breath and headache. Productive cough for 2.5 weeks.  EXAM: CHEST  2 VIEW  COMPARISON:  04/09/2014  FINDINGS: Stable mild cardiomegaly, accentuated by increase in lower mediastinal fat. Normal aortic and hilar contours.  There is no edema, consolidation, effusion, or pneumothorax.  No acute osseous findings.  IMPRESSION: Stable exam.  No active cardiopulmonary disease.   Electronically Signed   By: Monte Fantasia M.D.   On: 12/12/2014 17:41     EKG Interpretation   Date/Time:  Sunday December 12 2014 18:32:36 EDT Ventricular Rate:  102 PR Interval:  152 QRS Duration: 81 QT Interval:  335 QTC Calculation: 436 R Axis:   33 Text Interpretation:  Sinus tachycardia Low voltage, precordial leads  Confirmed by Jeneen Rinks  MD, [place] ([id number]) on 12/12/2014 6:38:07 PM      MDM   Final diagnoses:  Dyspnea  Allergic rhinitis, unspecified allergic rhinitis type    Doubt ACS,  PE, Pneumonia.  Pt not hypoxic or febrile.    Tanna Furry, MD 12/17/14 [phone]

## 2014-12-12 NOTE — ED Notes (Signed)
MD at bedside. 

## 2014-12-16 ENCOUNTER — Encounter: Payer: Self-pay | Admitting: Gastroenterology

## 2014-12-17 ENCOUNTER — Encounter: Payer: Self-pay | Admitting: Gastroenterology

## 2015-02-11 ENCOUNTER — Ambulatory Visit (INDEPENDENT_AMBULATORY_CARE_PROVIDER_SITE_OTHER): Payer: 59 | Admitting: Gastroenterology

## 2015-02-11 ENCOUNTER — Other Ambulatory Visit (INDEPENDENT_AMBULATORY_CARE_PROVIDER_SITE_OTHER): Payer: 59

## 2015-02-11 ENCOUNTER — Encounter: Payer: Self-pay | Admitting: Gastroenterology

## 2015-02-11 VITALS — BP 90/50 | HR 108 | Ht 60.0 in | Wt 350.2 lb

## 2015-02-11 DIAGNOSIS — R197 Diarrhea, unspecified: Secondary | ICD-10-CM

## 2015-02-11 LAB — CBC WITH DIFFERENTIAL/PLATELET
Basophils Absolute: 0 10*3/uL (ref 0.0–0.1)
Basophils Relative: 0.5 % (ref 0.0–3.0)
EOS ABS: 0.1 10*3/uL (ref 0.0–0.7)
Eosinophils Relative: 1 % (ref 0.0–5.0)
HCT: 41.1 % (ref 36.0–46.0)
Hemoglobin: 13.4 g/dL (ref 12.0–15.0)
Lymphocytes Relative: 22.5 % (ref 12.0–46.0)
Lymphs Abs: 2.1 10*3/uL (ref 0.7–4.0)
MCHC: 32.6 g/dL (ref 30.0–36.0)
MCV: 83.4 fl (ref 78.0–100.0)
MONO ABS: 0.4 10*3/uL (ref 0.1–1.0)
Monocytes Relative: 4.6 % (ref 3.0–12.0)
NEUTROS ABS: 6.7 10*3/uL (ref 1.4–7.7)
NEUTROS PCT: 71.4 % (ref 43.0–77.0)
Platelets: 428 10*3/uL — ABNORMAL HIGH (ref 150.0–400.0)
RBC: 4.92 Mil/uL (ref 3.87–5.11)
RDW: 14.3 % (ref 11.5–15.5)
WBC: 9.3 10*3/uL (ref 4.0–10.5)

## 2015-02-11 LAB — SEDIMENTATION RATE: Sed Rate: 60 mm/hr — ABNORMAL HIGH (ref 0–22)

## 2015-02-11 MED ORDER — CHOLESTYRAMINE 4 G PO PACK: 4.0000 g | PACK | Freq: Two times a day (BID) | ORAL | Status: DC

## 2015-02-11 NOTE — Patient Instructions (Signed)
You will have labs checked today in the basement lab.  Please head down after you check out with the front desk  (cbc, esr, stool pathogen panel). Cholestyramine powder, take one dose twice daily for now. You may need colonoscopy depending on labs above, response to cholestyramine trial.

## 2015-02-11 NOTE — Progress Notes (Signed)
HPI: This is a   very pleasant 39 year old woman who is here with her husband and her daughter today   who was referred to me by Vladimir Creeks, MD  to evaluate  diarrhea .    Chief complaint is chronic diarrhea  Has had diarrhea for many years.  At first they felt it was from metformin which was stopped 2-3 years ago.  Loose stools, never bloody for many years (0-4 times per day), even nocturnal.  She tried imodium without change.  Lap chole 2007, diarrhea started around that time.  She is massively obese with a BMI near 70  Review of systems: Pertinent positive and negative review of systems were noted in the above HPI section. Complete review of systems was performed and was otherwise normal.   Past Medical History  Diagnosis Date  . Diabetes mellitus type II   . Abdominal wall hernia     mid-abdomen  . OSA on CPAP     SEVERE OSA PER STUDY 01-24-2005  . Major depression, recurrent, chronic   . Generalized anxiety disorder   . Arthralgia of ankle, left     stress foot fx  . Hyperlipidemia   . History of seizures as a child   . 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 Scoliosis of thoracic spine   . Hypertension   . Hypothyroidism   . Wears glasses   . Kidney stones     Past Surgical History  Procedure Laterality Date  . Cesarean section  dec 1997/  06-03-2001/   01-01-2005    BILATERAL TUBAL LIGATION WITH LAST ONE  . Partial knee arthroplasty Right 04/19/2014    Procedure: RIGHT UNI KNEE ARTHROPLASTY MEDIALLY ;  Surgeon: Mauri Pole, MD;  Location: [facility];  Service: Orthopedics;  Laterality: Right;  . Laparoscopic cholecystectomy  09-25-2005  . Dilation and curettage of uterus  1995    WITH SUCTION  . Anterior talofibular ligament repair Left 11/15/2014    Procedure: ANTERIOR TALOFIBULAR LIGAMENT REPAIR;  Surgeon: Jana Half, DPM;  Location: [facility];  Service: Podiatry;  Laterality: Left;    Current  Outpatient Prescriptions  Medication Sig Dispense Refill  . canagliflozin (INVOKANA) 300 MG TABS tablet Take 300 mg by mouth daily before breakfast.    . Cetirizine HCl (ZYRTEC ALLERGY) 10 MG CAPS Take 1 capsule (10 mg total) by mouth daily. 30 capsule 1  . dicyclomine (BENTYL) 10 MG capsule Take 10 mg by mouth 4 (four) times daily -  before meals and at bedtime.    . fluticasone (FLONASE) 50 MCG/ACT nasal spray 1 spray each nares bid 10 g 1  . gabapentin (NEURONTIN) 300 MG capsule Take 600 mg by mouth 2 (two) times daily.     Marland Kitchen gemfibrozil (LOPID) 600 MG tablet Take 600 mg by mouth 2 (two) times daily before a meal.    . hydrOXYzine (VISTARIL) 50 MG capsule Take 100 mg by mouth at bedtime. sleep    . insulin NPH-regular Human (NOVOLIN 70/30) (70-30) 100 UNIT/ML injection Inject 50 Units into the skin 2 (two) times daily with a meal.     . levothyroxine (SYNTHROID, LEVOTHROID) 100 MCG tablet Take 50 mcg by mouth every morning.     . pioglitazone (ACTOS) 45 MG tablet Take 45 mg by mouth every morning.    . quinapril (ACCUPRIL) 5 MG tablet Take 2.5 mg by mouth every morning.     Marland Kitchen  venlafaxine XR (EFFEXOR-XR) 37.5 MG 24 hr capsule Take 37.5-75 mg by mouth 2 (two) times daily. Take 2 capsules= 75mg  in the morning and take 1 capsule = 37.5mg  at bedtime    . Zolpidem Tartrate (AMBIEN PO) Take 1 tablet by mouth at bedtime as needed (sleep).    . clindamycin (CLEOCIN) 300 MG capsule Take 900 mg by mouth as needed. Before dental appointments     No current facility-administered medications for this visit.    Allergies as of 02/11/2015 - Review Complete 02/11/2015  Allergen Reaction Noted  . Penicillins Hives 01/11/2012    Family History  Problem Relation Age of Onset  . Adopted: Yes  . Autism Son   . ADD / ADHD Son   . Apraxia Son     History   Social History  . Marital Status: Married    Spouse Name: N/A  . Number of Children: 2  . Years of Education: N/A   Occupational History  .  Not on file.   Social History Main Topics  . Smoking status: Former Smoker -- 4 years    Types: Cigarettes    Quit date: 07/25/1999  . Smokeless tobacco: Never Used  . Alcohol Use: Yes     Comment: RARE  . Drug Use: No  . Sexual Activity: Not on file   Other Topics Concern  . Not on file   Social History Narrative     Physical Exam: BP 90/50 mmHg  Pulse 108  Ht 5' (1.524 m)  Wt 350 lb 4 oz (158.872 kg)  BMI 68.40 kg/m2  LMP 11/11/2014 (LMP Unknown) Constitutional: generally well-appearing Psychiatric: alert and oriented x3 Eyes: extraocular movements intact Mouth: oral pharynx moist, no lesions Neck: supple no lymphadenopathy Cardiovascular: heart regular rate and rhythm Lungs: clear to auscultation bilaterally Abdomen: soft, nontender, nondistended, no obvious ascites, no peritoneal signs, normal bowel sounds Extremities: no lower extremity edema bilaterally Skin: no lesions on visible extremities   Assessment and plan: 39 y.o. female with  massive obesity (BMI 68), chronic diarrhea  She does seem to feel that her chronic diarrhea started around the time that her gallbladder was removed 9 or 10 years ago. Perhaps this is bile acid related diarrhea. I'm going to try her on cholestyramine powder. She will take this twice daily. We will also start basic workup for chronic diarrhea with CBC, complete metabolic profile, sedimentation rate and stool pathogen panel. If none of the above is helpful that she will likely need colonoscopy. Given her extreme BMI this will be at increased risk for complications and we will have to do it at the hospital with anesthesiology present.   Owens Loffler, MD [facility] 02/11/2015, 1:30 PM  Cc: Vladimir Creeks, MD

## 2015-02-14 ENCOUNTER — Other Ambulatory Visit: Payer: 59

## 2015-02-14 DIAGNOSIS — R197 Diarrhea, unspecified: Secondary | ICD-10-CM

## 2015-02-17 LAB — GASTROINTESTINAL PATHOGEN PANEL PCR
C. difficile Tox A/B, PCR: NEGATIVE
CRYPTOSPORIDIUM, PCR: NEGATIVE
Campylobacter, PCR: NEGATIVE
E COLI (ETEC) LT/ST, PCR: NEGATIVE
E COLI (STEC) STX1/STX2, PCR: NEGATIVE
E coli 0157, PCR: NEGATIVE
Giardia lamblia, PCR: NEGATIVE
Norovirus, PCR: NEGATIVE
ROTAVIRUS, PCR: NEGATIVE
SALMONELLA, PCR: NEGATIVE
Shigella, PCR: NEGATIVE

## 2015-03-23 ENCOUNTER — Ambulatory Visit: Payer: 59 | Attending: Podiatry | Admitting: Physical Therapy

## 2015-03-23 DIAGNOSIS — M2142 Flat foot [pes planus] (acquired), left foot: Secondary | ICD-10-CM | POA: Diagnosis present

## 2015-03-23 DIAGNOSIS — M25572 Pain in left ankle and joints of left foot: Secondary | ICD-10-CM

## 2015-03-23 DIAGNOSIS — M25472 Effusion, left ankle: Secondary | ICD-10-CM

## 2015-03-23 DIAGNOSIS — R269 Unspecified abnormalities of gait and mobility: Secondary | ICD-10-CM | POA: Diagnosis present

## 2015-03-23 DIAGNOSIS — R29898 Other symptoms and signs involving the musculoskeletal system: Secondary | ICD-10-CM

## 2015-03-23 NOTE — Therapy (Signed)
[place], [place], [address] Phone: [phone]   Fax:  [phone]  Physical Therapy Evaluation  Patient Details  Name: [name] MRN: [id number] Date of Birth: 04/04/1976 Referring Provider:  Jana Half, DPM  Encounter Date: 03/23/2015      PT End of Session - 03/23/15 1123    Visit Number 1   Number of Visits 12   Date for PT Re-Evaluation 05/04/15   PT Start Time 7035   PT Stop Time 1100   PT Time Calculation (min) 45 min   Activity Tolerance Patient tolerated treatment well   Behavior During Therapy [facility] for tasks assessed/performed      Past Medical History  Diagnosis Date  . Diabetes mellitus type II   . Abdominal wall hernia     mid-abdomen  . OSA on CPAP     SEVERE OSA PER STUDY 01-24-2005  . Major depression, recurrent, chronic   . Generalized anxiety disorder   . Arthralgia of ankle, left     stress foot fx  . Hyperlipidemia   . History of seizures as a child   . 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 Scoliosis of thoracic spine   . Hypertension   . Hypothyroidism   . Wears glasses   . Kidney stones     Past Surgical History  Procedure Laterality Date  . Cesarean section  dec 1997/  06-03-2001/   01-01-2005    BILATERAL TUBAL LIGATION WITH LAST ONE  . Partial knee arthroplasty Right 04/19/2014    Procedure: RIGHT UNI KNEE ARTHROPLASTY MEDIALLY ;  Surgeon: Mauri Pole, MD;  Location: [facility];  Service: Orthopedics;  Laterality: Right;  . Laparoscopic cholecystectomy  09-25-2005  . Dilation and curettage of uterus  1995    WITH SUCTION  . Anterior talofibular ligament repair Left 11/15/2014    Procedure: ANTERIOR TALOFIBULAR LIGAMENT REPAIR;  Surgeon: Jana Half, DPM;  Location: [facility];  Service: Podiatry;  Laterality: Left;    There were no vitals filed for this visit.  Visit Diagnosis:  Left ankle pain  - Plan: PT plan of care cert/re-cert  Weakness of foot, left - Plan: PT plan of care cert/re-cert  Left ankle swelling - Plan: PT plan of care cert/re-cert  Abnormality of gait - Plan: PT plan of care cert/re-cert      Subjective Assessment - 03/23/15 1019    Subjective pt is a 39 y.o F with CC of L ankle surgery due to laxity in the ankle due to chronic instablity and rolling. Surgery was performed on surgery 11/15/2014. Since the surgery she reports pain and tightness and cramping   Limitations House hold activities;Walking;Standing   How long can you sit comfortably? unlimited   How long can you stand comfortably? 30 min   How long can you walk comfortably? 30 min   Diagnostic tests x-ray May per pt report that everything looked good   Patient Stated Goals to be pain free, to be able to walk wihtout problems   Currently in Pain? Yes   Pain Score 8   8/10 in regards to tightness    Pain Location Ankle   Pain Orientation Left   Pain Descriptors / Indicators Tightness   Pain Type Surgical pain   Pain Onset More than a month ago   Pain Frequency Constant   Aggravating Factors  walking, standing,  stairs, "weight bearing"   Pain Relieving Factors "unknown"            [facility] PT Assessment - 03/23/15 1030    Assessment   Medical Diagnosis L ankle weakness   Onset Date/Surgical Date 11/15/14  ligament reconstruction    Hand Dominance Right   Next MD Visit 03/28/2015   Prior Therapy yes   Precautions   Precautions None   Restrictions   Weight Bearing Restrictions No   Balance Screen   Has the patient fallen in the past 6 months Yes   How many times? 2   Has the patient had a decrease in activity level because of a fear of falling?  No   Is the patient reluctant to leave their home because of a fear of falling?  No   Home Environment   Living Environment Private residence   Living Arrangements Spouse/significant other;Children   Available Help at Discharge Available 24  hours/day;Available PRN/intermittently   Type of Home House   Home Access Stairs to enter   Entrance Stairs-Number of Steps 2   Entrance Stairs-Rails None   Home Layout One level   [place] - 2 wheels   Prior Function   Level of Independence Independent;Independent with basic ADLs   Cognition   Overall Cognitive Status Within Functional Limits for tasks assessed   Observation/Other Assessments   Lower Extremity Functional Scale  34/80   Observation/Other Assessments-Edema    Edema Figure 8   Figure 8 Edema   Figure 8 - Left  52cm   Posture/Postural Control   Posture/Postural Control Postural limitations   Postural Limitations Rounded Shoulders;Forward head;Increased thoracic kyphosis   ROM / Strength   AROM / PROM / Strength AROM;Strength   AROM   AROM Assessment Site Ankle   Right/Left Ankle Right;Left   Right Ankle Dorsiflexion 6   Right Ankle Plantar Flexion 42   Right Ankle Inversion 28   Right Ankle Eversion 18   Left Ankle Dorsiflexion 2   Left Ankle Plantar Flexion 40   Left Ankle Inversion 26   Left Ankle Eversion 12   Strength   Strength Assessment Site Ankle   Right/Left Ankle Right;Left   Right Ankle Dorsiflexion 5/5   Right Ankle Plantar Flexion 5/5   Right Ankle Inversion 5/5   Right Ankle Eversion 5/5   Left Ankle Dorsiflexion 3+/5   Left Ankle Plantar Flexion 4+/5   Left Ankle Inversion 3+/5  pain during testing   Left Ankle Eversion 3+/5  pain during testing   Ambulation/Gait   Gait Comments [name] presents to OPPT with CC of L ankle pain S/P ligament reconstruction surgery on 11/15/2014. She demonstrates swelling in the foot/ankle compared bil, with limited AROM noted with DF, and weakness with pain during MMT of DF/ eversion/ inversion. She currently ambulates with bil circumducted gait with wide BOS, and limited step length on the L. She would benefit from skilled physical therapy to maxmize her function by addressing the impairments listed.                             PT Education - 03/23/15 1122    Education provided Yes   Education Details evaluationfindings, POC, goals, HEP   Person(s) Educated Patient   Methods Explanation   Comprehension Verbalized understanding          PT Short Term Goals - 03/23/15 1127    PT SHORT TERM GOAL #1  Title pt will be I with basic HEP (04/13/2015)   Time 3   Period Weeks   Status New   PT SHORT TERM GOAL #2   Title She will verbalize and demonstrate techniques to reduce L ankle swelling and inflammation via RICE method (04/13/2015)   Time 3   Period Weeks   Status New           PT Long Term Goals - 03/23/15 1128    PT LONG TERM GOAL #1   Title upon discharge pt will be I with all HEP given throughout therapy (05/04/2015)   Time 6   Period Weeks   Status New   PT LONG TERM GOAL #2   Title She will increase L ankle DF, and eversion by > 5 degrees to assist with promoting a function gait pattern (05/04/2015)   Time 6   Period Weeks   Status New   PT LONG TERM GOAL #3   Title pt will demonstrate >4+/5 inthe L ankle for walking and standing endurance and safety (05/04/2015)   Time 6   Period Weeks   Status New   PT LONG TERM GOAL #4   Title she will increase her LEFS score by >6 points to assist with improved functional mobility upon discharge (05/04/2015)   Time 6   Period Weeks   Status New   PT LONG TERM GOAL #5   Title pt will be able to stand or walk for >30 min and/or naviagate > 10 steps with < 2/10 pain in the L ankle to help with ADLs (05/04/2015)   Time 6   Period Weeks   Status New               Plan - 03/23/15 1123    Clinical Impression Statement Orlena presents to OPPT with CC of L ankle pain S/P ligament reconstruction surgery on 11/15/2014. She demonstrates swelling in the foot/ankle compared bil, with limited AROM noted with DF, and weakness with pain during MMT of DF/ eversion/ inversion. She currently ambulates with bil  circumducted gait with wide BOS, and limited step length on the L. She would benefit from skilled physical therapy to maxmize her function by addressing the impairments listed.    Pt will benefit from skilled therapeutic intervention in order to improve on the following deficits Abnormal gait;Decreased activity tolerance;Decreased knowledge of precautions;Obesity;Pain;Improper body mechanics;Postural dysfunction;Increased edema;Decreased strength;Decreased mobility;Hypomobility;Decreased balance;Decreased endurance;Increased muscle spasms   Rehab Potential Good   PT Frequency 2x / week   PT Duration 6 weeks   PT Treatment/Interventions ADLs/Self Care Home Management;Cryotherapy;Electrical Stimulation;Iontophoresis 4mg /ml Dexamethasone;Moist Heat;Ultrasound;Gait training;Functional mobility training;Therapeutic activities;Therapeutic exercise;Balance training;Patient/family education;Manual techniques;Passive range of motion;Dry needling;Taping;Vasopneumatic Device   PT Next Visit Plan assess response to HEP, ankle mobilization, strengthening, modalities PRN for pain, gait training   PT Home Exercise Plan See HEP handout   Consulted and Agree with Plan of Care Patient         Problem List Patient Active Problem List   Diagnosis Date Noted  . Morbid obesity 04/20/2014  . S/P right UKR 04/19/2014  . Shoulder pain 07/31/2012  . DM type 2 with diabetic peripheral neuropathy 07/31/2012  . Hyperlipidemia 07/31/2012  . Cushing disease 07/31/2012  . Hypothyroid 07/31/2012  . Major depressive disorder, recurrent episode, mild 04/08/2012  . Generalized anxiety disorder 10/01/2011   Starr Lake PT, DPT, LAT, ATC  03/23/2015  11:37 AM    [facility] [address] [place], [place], [address]  Phone: [phone]   Fax:  [phone]

## 2015-03-23 NOTE — Patient Instructions (Addendum)
   Kahlyn Shippey PT, DPT, LAT, ATC  [facility] Phone: [phone]     

## 2015-03-28 ENCOUNTER — Ambulatory Visit: Payer: 59 | Admitting: Physical Therapy

## 2015-03-28 DIAGNOSIS — R29898 Other symptoms and signs involving the musculoskeletal system: Secondary | ICD-10-CM

## 2015-03-28 DIAGNOSIS — M25572 Pain in left ankle and joints of left foot: Secondary | ICD-10-CM | POA: Diagnosis not present

## 2015-03-28 DIAGNOSIS — M25472 Effusion, left ankle: Secondary | ICD-10-CM

## 2015-03-28 DIAGNOSIS — R269 Unspecified abnormalities of gait and mobility: Secondary | ICD-10-CM

## 2015-03-28 NOTE — Therapy (Signed)
[place], [place], [address] Phone: [phone]   Fax:  [phone]  Physical Therapy Treatment  Patient Details  Name: [name] MRN: [id number] Date of Birth: 01/01/1976 Referring Provider:  Vladimir Creeks, MD  Encounter Date: 03/28/2015      PT End of Session - 03/28/15 0844    Visit Number 2   Number of Visits 12   Date for PT Re-Evaluation 05/04/15   PT Start Time 0800   PT Stop Time 0852   PT Time Calculation (min) 52 min   Activity Tolerance Patient tolerated treatment well   Behavior During Therapy [facility] for tasks assessed/performed      Past Medical History  Diagnosis Date  . Diabetes mellitus type II   . Abdominal wall hernia     mid-abdomen  . OSA on CPAP     SEVERE OSA PER STUDY 01-24-2005  . Major depression, recurrent, chronic   . Generalized anxiety disorder   . Arthralgia of ankle, left     stress foot fx  . Hyperlipidemia   . History of seizures as a child   . 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 Scoliosis of thoracic spine   . Hypertension   . Hypothyroidism   . Wears glasses   . Kidney stones     Past Surgical History  Procedure Laterality Date  . Cesarean section  dec 1997/  06-03-2001/   01-01-2005    BILATERAL TUBAL LIGATION WITH LAST ONE  . Partial knee arthroplasty Right 04/19/2014    Procedure: RIGHT UNI KNEE ARTHROPLASTY MEDIALLY ;  Surgeon: Mauri Pole, MD;  Location: [facility];  Service: Orthopedics;  Laterality: Right;  . Laparoscopic cholecystectomy  09-25-2005  . Dilation and curettage of uterus  1995    WITH SUCTION  . Anterior talofibular ligament repair Left 11/15/2014    Procedure: ANTERIOR TALOFIBULAR LIGAMENT REPAIR;  Surgeon: Jana Half, DPM;  Location: [facility];  Service: Podiatry;  Laterality: Left;    There were no vitals filed for this visit.  Visit Diagnosis:  Left ankle  pain  Weakness of foot, left  Left ankle swelling  Abnormality of gait      Subjective Assessment - 03/28/15 0755    Subjective "Saturday night I had some issues with the R lower leg tightness, some issues in the R ankle." pt reports pain gradually gets worse throughout the day.    Currently in Pain? Yes   Pain Score 3    Pain Location Ankle   Pain Orientation Left   Pain Type Surgical pain   Pain Onset More than a month ago   Pain Frequency Constant                         [facility] Adult PT Treatment/Exercise - 03/28/15 0803    Balance   Balance Assessed Yes   Static Standing Balance   Rhomberg - Eyes Opened 30  x 2; dmeonstrated mild postural sway   Rhomberg - Eyes Closed 30  x1: increased postural sway but able to maintain balance   Modalities   Modalities Vasopneumatic   Vasopneumatic   Number Minutes Vasopneumatic  10 minutes   Vasopnuematic Location  Ankle  Left   Vasopneumatic Pressure Medium   Vasopneumatic Temperature  coldest   Manual Therapy   Manual Therapy Joint mobilization;Soft tissue mobilization  Joint Mobilization grade 2-3 in all directions with intermiitent grade 4 distraction   Ankle Exercises: Stretches   Soleus Stretch 2 reps;30 seconds   Gastroc Stretch 2 reps;30 seconds   Ankle Exercises: Aerobic   Stationary Bike Nu Step L4 x 8 min   Ankle Exercises: Standing   Rocker Board 1 minute  x 2 with DF/PF   Other Standing Ankle Exercises walking on rubber angled matt 4 x 20 ft  focusing on maintaining balance with Inv/eversion   Ankle Exercises: Seated   Ankle Circles/Pumps AROM;Strengthening;Right;Left;20 reps  2 x 1 x CW,1 x CCW   Other Seated Ankle Exercises ankle 4 way theraband 2 x 10  with red theraband                PT Education - 03/28/15 0844    Education provided Yes   Education Details educated about the game ready and stages of ice          PT Short Term Goals - 03/23/15 1127    PT SHORT TERM  GOAL #1   Title pt will be I with basic HEP (04/13/2015)   Time 3   Period Weeks   Status New   PT SHORT TERM GOAL #2   Title She will verbalize and demonstrate techniques to reduce L ankle swelling and inflammation via RICE method (04/13/2015)   Time 3   Period Weeks   Status New           PT Long Term Goals - 03/23/15 1128    PT LONG TERM GOAL #1   Title upon discharge pt will be I with all HEP given throughout therapy (05/04/2015)   Time 6   Period Weeks   Status New   PT LONG TERM GOAL #2   Title She will increase L ankle DF, and eversion by > 5 degrees to assist with promoting a function gait pattern (05/04/2015)   Time 6   Period Weeks   Status New   PT LONG TERM GOAL #3   Title pt will demonstrate >4+/5 inthe L ankle for walking and standing endurance and safety (05/04/2015)   Time 6   Period Weeks   Status New   PT LONG TERM GOAL #4   Title she will increase her LEFS score by >6 points to assist with improved functional mobility upon discharge (05/04/2015)   Time 6   Period Weeks   Status New   PT LONG TERM GOAL #5   Title pt will be able to stand or walk for >30 min and/or naviagate > 10 steps with < 2/10 pain in the L ankle to help with ADLs (05/04/2015)   Time 6   Period Weeks   Status New               Plan - 03/28/15 0845    Clinical Impression Statement Glynna tolerated all exercises today with mild report of pain or discomfort. she reported a fear of getting a calf cramp during rocker board exercises with PF/DF, and required VC to keep the knee stable during rocker board exercise to focus on ankle ROM. During single leg balance she reported feeling off during rhomberg balance but was able to perform exercise.    PT Next Visit Plan ankle mobilizations, strengtehing, balance, modalities PRN, gait training   Consulted and Agree with Plan of Care Patient        Problem List Patient Active Problem List   Diagnosis Date Noted  .  Morbid obesity 04/20/2014   . S/P right UKR 04/19/2014  . Shoulder pain 07/31/2012  . DM type 2 with diabetic peripheral neuropathy 07/31/2012  . Hyperlipidemia 07/31/2012  . Cushing disease 07/31/2012  . Hypothyroid 07/31/2012  . Major depressive disorder, recurrent episode, mild 04/08/2012  . Generalized anxiety disorder 10/01/2011   Starr Lake PT, DPT, LAT, ATC  03/28/2015  8:52 AM    [facility] [address] [place], [place], [address] Phone: [phone]   Fax:  [phone]

## 2015-04-08 ENCOUNTER — Ambulatory Visit: Payer: 59 | Admitting: Physical Therapy

## 2015-04-08 DIAGNOSIS — M25572 Pain in left ankle and joints of left foot: Secondary | ICD-10-CM

## 2015-04-08 DIAGNOSIS — R29898 Other symptoms and signs involving the musculoskeletal system: Secondary | ICD-10-CM

## 2015-04-08 DIAGNOSIS — R269 Unspecified abnormalities of gait and mobility: Secondary | ICD-10-CM

## 2015-04-08 DIAGNOSIS — M25472 Effusion, left ankle: Secondary | ICD-10-CM

## 2015-04-08 NOTE — Therapy (Addendum)
[place], [place], [address] Phone: [phone]   Fax:  [phone]  Physical Therapy Treatment  Patient Details  Name: [name] MRN: [id number] Date of Birth: [birth year] Referring Provider:  Vladimir Creeks, MD  Encounter Date: 04/08/2015      PT End of Session - 04/08/15 1058    Visit Number 3   Number of Visits 12   Date for PT Re-Evaluation 05/04/15   PT Start Time 1053   PT Stop Time 9326   PT Time Calculation (min) 38 min      Past Medical History  Diagnosis Date  . Diabetes mellitus type II   . Abdominal wall hernia     mid-abdomen  . OSA on CPAP     SEVERE OSA PER STUDY 01-24-2005  . Major depression, recurrent, chronic   . Generalized anxiety disorder   . Arthralgia of ankle, left     stress foot fx  . Hyperlipidemia   . History of seizures as a child   . 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 Scoliosis of thoracic spine   . Hypertension   . Hypothyroidism   . Wears glasses   . Kidney stones     Past Surgical History  Procedure Laterality Date  . Cesarean section  dec 1997/  06-03-2001/   01-01-2005    BILATERAL TUBAL LIGATION WITH LAST ONE  . Partial knee arthroplasty Right 04/19/2014    Procedure: RIGHT UNI KNEE ARTHROPLASTY MEDIALLY ;  Surgeon: Mauri Pole, MD;  Location: [facility];  Service: Orthopedics;  Laterality: Right;  . Laparoscopic cholecystectomy  09-25-2005  . Dilation and curettage of uterus  1995    WITH SUCTION  . Anterior talofibular ligament repair Left 11/15/2014    Procedure: ANTERIOR TALOFIBULAR LIGAMENT REPAIR;  Surgeon: Jana Half, DPM;  Location: [facility];  Service: Podiatry;  Laterality: Left;    There were no vitals filed for this visit.  Visit Diagnosis:  Left ankle pain  Weakness of foot, left  Left ankle swelling  Abnormality of gait      Subjective Assessment - 04/08/15  1055    Subjective The calf is tight.    Currently in Pain? Yes   Pain Score 1    Pain Location Ankle   Pain Orientation Left   Pain Descriptors / Indicators Tightness            [facility] PT Assessment - 04/08/15 0001    AROM   Left Ankle Dorsiflexion 2   Left Ankle Inversion 40   Left Ankle Eversion 15                     [facility] Adult PT Treatment/Exercise - 04/08/15 1103    Manual Therapy   Manual Therapy Soft tissue mobilization   Soft tissue mobilization [place] assisted calf musculature softening and lengthening prone   Ankle Exercises: Standing   Heel Raises 10 reps   Toe Raise 10 reps   Ankle Exercises: Stretches   Gastroc Stretch 2 reps;30 seconds  edge of step   Ankle Exercises: Aerobic   Stationary Bike Nu Step L3 x 5 min                PT Education - 04/08/15 1128    Education provided Yes   Education Details heel hang    Person(s) Educated Patient  Methods Explanation;Handout   Comprehension Verbalized understanding          PT Short Term Goals - 04/08/15 1142    PT SHORT TERM GOAL #1   Title pt will be I with basic HEP (04/13/2015)   Time 3   Period Weeks   Status Partially Met   PT SHORT TERM GOAL #2   Title She will verbalize and demonstrate techniques to reduce L ankle swelling and inflammation via RICE method (04/13/2015)   Time 3   Period Weeks   PT SHORT TERM GOAL #3   Status Achieved           PT Long Term Goals - 04/08/15 1142    PT LONG TERM GOAL #1   Title upon discharge pt will be I with all HEP given throughout therapy (05/04/2015)   Time 6   Period Weeks   Status On-going   PT LONG TERM GOAL #2   Title She will increase L ankle DF, and eversion by > 5 degrees to assist with promoting a function gait pattern (05/04/2015)   Time 6   Period Weeks   Status Partially Met   PT LONG TERM GOAL #3   Title pt will demonstrate >4+/5 inthe L ankle for walking and standing endurance and safety (05/04/2015)   Time  6   Period Weeks   Status On-going   PT LONG TERM GOAL #4   Title she will increase her LEFS score by >6 points to assist with improved functional mobility upon discharge (05/04/2015)   Time 6   Period Weeks   Status On-going   PT LONG TERM GOAL #5   Title pt will be able to stand or walk for >30 min and/or naviagate > 10 steps with < 2/10 pain in the L ankle to help with ADLs (05/04/2015)   Time 6   Period Weeks   Status On-going               Plan - 04/08/15 1133    Clinical Impression Statement Pt with no pain upon arrival and increased pain on Nustep and with standing heel raises and calf stretch up to 5/10. Pt lieing prone, Rock tool used to soften calf musculature distal to proximal with extra time spent distal due to increased sensitivity and tenderness. Pt reports pain decreased to 0/10 after manual. AROM DF same, Inversion and eversion have improved. Pt instructed in standing heel hang at home to address DF. Currently pt is only performing bands and Has not been stretching her calf.    PT Next Visit Plan ankle mobilizations, soft tissue work,  strengtehing, balance, modalities PRN, gait training        Problem List Patient Active Problem List   Diagnosis Date Noted  . Morbid obesity 04/20/2014  . S/P right UKR 04/19/2014  . Shoulder pain 07/31/2012  . DM type 2 with diabetic peripheral neuropathy 07/31/2012  . Hyperlipidemia 07/31/2012  . Cushing disease 07/31/2012  . Hypothyroid 07/31/2012  . Major depressive disorder, recurrent episode, mild 04/08/2012  . Generalized anxiety disorder 10/01/2011    Dorene Ar , PTA  04/08/2015, 11:52 AM  [facility] [address] [place], [place], [address] Phone: [phone]   Fax:  [phone]

## 2015-04-08 NOTE — Patient Instructions (Signed)
Gastroc / Heel Cord Stretch - On Step   Stand with heels over edge of stair. Holding rail, lower heels until stretch is felt in calf of legs. Hold 30 secnds Repeat 3___ times. Do _2__ times per day.  Copyright  VHI. All rights reserved.

## 2015-04-19 ENCOUNTER — Ambulatory Visit: Payer: 59 | Attending: Podiatry | Admitting: Physical Therapy

## 2015-04-19 DIAGNOSIS — R269 Unspecified abnormalities of gait and mobility: Secondary | ICD-10-CM | POA: Insufficient documentation

## 2015-04-19 DIAGNOSIS — M25572 Pain in left ankle and joints of left foot: Secondary | ICD-10-CM | POA: Insufficient documentation

## 2015-04-19 DIAGNOSIS — M2142 Flat foot [pes planus] (acquired), left foot: Secondary | ICD-10-CM | POA: Insufficient documentation

## 2015-04-21 ENCOUNTER — Ambulatory Visit: Payer: 59 | Admitting: Physical Therapy

## 2015-04-26 ENCOUNTER — Ambulatory Visit: Payer: 59 | Admitting: Physical Therapy

## 2015-04-26 DIAGNOSIS — R269 Unspecified abnormalities of gait and mobility: Secondary | ICD-10-CM

## 2015-04-26 DIAGNOSIS — M2142 Flat foot [pes planus] (acquired), left foot: Secondary | ICD-10-CM | POA: Diagnosis present

## 2015-04-26 DIAGNOSIS — R29898 Other symptoms and signs involving the musculoskeletal system: Secondary | ICD-10-CM

## 2015-04-26 DIAGNOSIS — M25572 Pain in left ankle and joints of left foot: Secondary | ICD-10-CM | POA: Diagnosis present

## 2015-04-26 NOTE — Therapy (Addendum)
[name], [place], [address] Phone: [phone]   Fax:  [phone]  Physical Therapy Treatment  Patient Details  Name: [name] [name] MRN: [id number] Date of Birth: Aug 04, 1976 Referring Provider:  Vladimir Creeks, MD  Encounter Date: 04/26/2015      PT End of Session - 04/26/15 1022    Visit Number 4   Date for PT Re-Evaluation 05/04/15   PT Start Time 1021   PT Stop Time 1102   PT Time Calculation (min) 41 min   Activity Tolerance Patient tolerated treatment well   Behavior During Therapy [facility] for tasks assessed/performed      Past Medical History  Diagnosis Date  . Diabetes mellitus type II   . Abdominal wall hernia     mid-abdomen  . OSA on CPAP     SEVERE OSA PER STUDY 01-24-2005  . Major depression, recurrent, chronic   . Generalized anxiety disorder   . Arthralgia of ankle, left     stress foot fx  . Hyperlipidemia   . History of seizures as a child   . 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 Scoliosis of thoracic spine   . Hypertension   . Hypothyroidism   . Wears glasses   . Kidney stones     Past Surgical History  Procedure Laterality Date  . Cesarean section  dec 1997/  06-03-2001/   01-01-2005    BILATERAL TUBAL LIGATION WITH LAST ONE  . Partial knee arthroplasty Right 04/19/2014    Procedure: RIGHT UNI KNEE ARTHROPLASTY MEDIALLY ;  Surgeon: Mauri Pole, MD;  Location: [facility];  Service: Orthopedics;  Laterality: Right;  . Laparoscopic cholecystectomy  09-25-2005  . Dilation and curettage of uterus  1995    WITH SUCTION  . Anterior talofibular ligament repair Left 11/15/2014    Procedure: ANTERIOR TALOFIBULAR LIGAMENT REPAIR;  Surgeon: Jana Half, DPM;  Location: [facility];  Service: Podiatry;  Laterality: Left;    There were no vitals filed for this visit.  Visit Diagnosis:  Left ankle pain  Weakness of foot,  left  Abnormality of gait      Subjective Assessment - 04/26/15 1024    Subjective The calf is still tight.  When I really use the ankle it burns around the incision.   Currently in Pain? Yes   Pain Score 5    Pain Location Ankle   Pain Orientation Left   Pain Descriptors / Indicators Tightness   Pain Type Surgical pain   Pain Onset More than a month ago                         [facility] Adult PT Treatment/Exercise - 04/26/15 0001    High Level Balance   High Level Balance Comments SLS with one had support, standing with eyes closed,  then tandem stance with left behind, semi tandem with right behind.   Manual Therapy   Soft tissue mobilization to left calf and achilles tendon   Ankle Exercises: Seated   Heel Raises 20 reps   Toe Raise 20 reps   Other Seated Ankle Exercises PNF x 10 D1 and D2   Ankle Exercises: Standing   Heel Raises 20 reps   Toe Raise 20 reps   Ankle Exercises: Stretches   Gastroc Stretch 2 reps;30 seconds  edge of step  PT Short Term Goals - 04/08/15 1142    PT SHORT TERM GOAL #1   Title pt will be I with basic HEP (04/13/2015)   Time 3   Period Weeks   Status Partially Met   PT SHORT TERM GOAL #2   Title She will verbalize and demonstrate techniques to reduce L ankle swelling and inflammation via RICE method (04/13/2015)   Time 3   Period Weeks   PT SHORT TERM GOAL #3   Status Achieved           PT Long Term Goals - 04/08/15 1142    PT LONG TERM GOAL #1   Title upon discharge pt will be I with all HEP given throughout therapy (05/04/2015)   Time 6   Period Weeks   Status On-going   PT LONG TERM GOAL #2   Title She will increase L ankle DF, and eversion by > 5 degrees to assist with promoting a function gait pattern (05/04/2015)   Time 6   Period Weeks   Status Partially Met   PT LONG TERM GOAL #3   Title pt will demonstrate >4+/5 inthe L ankle for walking and standing endurance and safety  (05/04/2015)   Time 6   Period Weeks   Status On-going   PT LONG TERM GOAL #4   Title she will increase her LEFS score by >6 points to assist with improved functional mobility upon discharge (05/04/2015)   Time 6   Period Weeks   Status On-going   PT LONG TERM GOAL #5   Title pt will be able to stand or walk for >30 min and/or naviagate > 10 steps with < 2/10 pain in the L ankle to help with ADLs (05/04/2015)   Time 6   Period Weeks   Status On-going               Plan - 04/26/15 1306    Clinical Impression Statement Patient tolerated treatment well today. Able to perform balance activities without increased pain. She has deficits with SLS on the left, but balance is also affected by her right TKR.   PT Next Visit Plan ankle mobilizations, soft tissue work,  strengtehing, balance, modalities PRN, gait training        Problem List Patient Active Problem List   Diagnosis Date Noted  . Morbid obesity 04/20/2014  . S/P right UKR 04/19/2014  . Shoulder pain 07/31/2012  . DM type 2 with diabetic peripheral neuropathy 07/31/2012  . Hyperlipidemia 07/31/2012  . Cushing disease 07/31/2012  . Hypothyroid 07/31/2012  . Major depressive disorder, recurrent episode, mild 04/08/2012  . Generalized anxiety disorder 10/01/2011    [name] PT  04/26/2015, 1:11 PM  [facility] [address] [place], [place], [address] Phone: [phone]   Fax:  [phone]         PHYSICAL THERAPY DISCHARGE SUMMARY  Visits from Start of Care: 4  Current functional level related to goals / functional outcomes: See goals, pt didn't return after last visit.   Remaining deficits: As of last visit, Ankle weakness, limited AROM, limited endurance.    Education / Equipment: HEP  Plan: Patient agrees to discharge.  Patient goals were not met. Patient is being discharged due to not returning since the last visit.  ?????         Kristoffer Leamon PT, DPT, LAT, ATC  07/18/2015  7:56 AM

## 2015-04-28 ENCOUNTER — Ambulatory Visit: Payer: 59 | Admitting: Physical Therapy

## 2015-05-03 ENCOUNTER — Ambulatory Visit: Payer: 59 | Admitting: Physical Therapy

## 2015-05-05 ENCOUNTER — Ambulatory Visit: Payer: 59 | Admitting: Physical Therapy

## 2015-05-10 ENCOUNTER — Ambulatory Visit: Payer: 59 | Admitting: Physical Therapy

## 2015-05-12 ENCOUNTER — Telehealth: Payer: Self-pay | Admitting: Physical Therapy

## 2015-05-12 ENCOUNTER — Ambulatory Visit: Payer: 59 | Attending: Podiatry | Admitting: Physical Therapy

## 2015-05-12 NOTE — Telephone Encounter (Signed)
Left a message regarding her missing the last 3 scheduled appointments. Also that she has no more scheduled appointments and if she wants to continue with therapy to call back and schedule appointments, if not she can be discharged.

## 2015-07-16 ENCOUNTER — Encounter (HOSPITAL_COMMUNITY): Payer: Self-pay | Admitting: Internal Medicine

## 2015-07-16 ENCOUNTER — Inpatient Hospital Stay (HOSPITAL_COMMUNITY)
Admission: EM | Admit: 2015-07-16 | Discharge: 2015-07-21 | DRG: 493 | Disposition: A | Discharge status: Skilled Nursing Facility | Payer: 59 | Attending: Internal Medicine | Admitting: Internal Medicine

## 2015-07-16 ENCOUNTER — Emergency Department (HOSPITAL_COMMUNITY): Payer: 59

## 2015-07-16 DIAGNOSIS — S82401A Unspecified fracture of shaft of right fibula, initial encounter for closed fracture: Secondary | ICD-10-CM

## 2015-07-16 DIAGNOSIS — I152 Hypertension secondary to endocrine disorders: Secondary | ICD-10-CM | POA: Diagnosis present

## 2015-07-16 DIAGNOSIS — E038 Other specified hypothyroidism: Secondary | ICD-10-CM

## 2015-07-16 DIAGNOSIS — M80861A Other osteoporosis with current pathological fracture, right lower leg, initial encounter for fracture: Principal | ICD-10-CM | POA: Diagnosis present

## 2015-07-16 DIAGNOSIS — G4733 Obstructive sleep apnea (adult) (pediatric): Secondary | ICD-10-CM | POA: Diagnosis present

## 2015-07-16 DIAGNOSIS — F330 Major depressive disorder, recurrent, mild: Secondary | ICD-10-CM | POA: Diagnosis present

## 2015-07-16 DIAGNOSIS — I10 Essential (primary) hypertension: Secondary | ICD-10-CM | POA: Diagnosis present

## 2015-07-16 DIAGNOSIS — D72829 Elevated white blood cell count, unspecified: Secondary | ICD-10-CM | POA: Diagnosis not present

## 2015-07-16 DIAGNOSIS — E1140 Type 2 diabetes mellitus with diabetic neuropathy, unspecified: Secondary | ICD-10-CM | POA: Diagnosis not present

## 2015-07-16 DIAGNOSIS — S82201A Unspecified fracture of shaft of right tibia, initial encounter for closed fracture: Secondary | ICD-10-CM

## 2015-07-16 DIAGNOSIS — E0840 Diabetes mellitus due to underlying condition with diabetic neuropathy, unspecified: Secondary | ICD-10-CM | POA: Insufficient documentation

## 2015-07-16 DIAGNOSIS — W010XXA Fall on same level from slipping, tripping and stumbling without subsequent striking against object, initial encounter: Secondary | ICD-10-CM | POA: Diagnosis present

## 2015-07-16 DIAGNOSIS — Z96651 Presence of right artificial knee joint: Secondary | ICD-10-CM | POA: Diagnosis present

## 2015-07-16 DIAGNOSIS — S82143A Displaced bicondylar fracture of unspecified tibia, initial encounter for closed fracture: Secondary | ICD-10-CM | POA: Diagnosis present

## 2015-07-16 DIAGNOSIS — Z6841 Body Mass Index (BMI) 40.0 and over, adult: Secondary | ICD-10-CM | POA: Diagnosis not present

## 2015-07-16 DIAGNOSIS — Z794 Long term (current) use of insulin: Secondary | ICD-10-CM | POA: Diagnosis not present

## 2015-07-16 DIAGNOSIS — R52 Pain, unspecified: Secondary | ICD-10-CM

## 2015-07-16 DIAGNOSIS — E662 Morbid (severe) obesity with alveolar hypoventilation: Secondary | ICD-10-CM

## 2015-07-16 DIAGNOSIS — E559 Vitamin D deficiency, unspecified: Secondary | ICD-10-CM | POA: Diagnosis present

## 2015-07-16 DIAGNOSIS — M9711XA Periprosthetic fracture around internal prosthetic right knee joint, initial encounter: Secondary | ICD-10-CM | POA: Diagnosis present

## 2015-07-16 DIAGNOSIS — F411 Generalized anxiety disorder: Secondary | ICD-10-CM | POA: Diagnosis present

## 2015-07-16 DIAGNOSIS — Z880 Allergy status to penicillin: Secondary | ICD-10-CM

## 2015-07-16 DIAGNOSIS — D62 Acute posthemorrhagic anemia: Secondary | ICD-10-CM | POA: Diagnosis not present

## 2015-07-16 DIAGNOSIS — Z22322 Carrier or suspected carrier of Methicillin resistant Staphylococcus aureus: Secondary | ICD-10-CM | POA: Diagnosis not present

## 2015-07-16 DIAGNOSIS — E1159 Type 2 diabetes mellitus with other circulatory complications: Secondary | ICD-10-CM | POA: Diagnosis present

## 2015-07-16 DIAGNOSIS — Z87891 Personal history of nicotine dependence: Secondary | ICD-10-CM | POA: Diagnosis not present

## 2015-07-16 DIAGNOSIS — E249 Cushing's syndrome, unspecified: Secondary | ICD-10-CM | POA: Diagnosis present

## 2015-07-16 DIAGNOSIS — E785 Hyperlipidemia, unspecified: Secondary | ICD-10-CM | POA: Diagnosis present

## 2015-07-16 DIAGNOSIS — E240 Pituitary-dependent Cushing's disease: Secondary | ICD-10-CM | POA: Diagnosis not present

## 2015-07-16 DIAGNOSIS — Z7984 Long term (current) use of oral hypoglycemic drugs: Secondary | ICD-10-CM | POA: Diagnosis not present

## 2015-07-16 DIAGNOSIS — M8080XA Other osteoporosis with current pathological fracture, unspecified site, initial encounter for fracture: Secondary | ICD-10-CM | POA: Diagnosis present

## 2015-07-16 DIAGNOSIS — E039 Hypothyroidism, unspecified: Secondary | ICD-10-CM | POA: Diagnosis present

## 2015-07-16 DIAGNOSIS — S82141A Displaced bicondylar fracture of right tibia, initial encounter for closed fracture: Secondary | ICD-10-CM

## 2015-07-16 DIAGNOSIS — Z79899 Other long term (current) drug therapy: Secondary | ICD-10-CM

## 2015-07-16 DIAGNOSIS — S82141S Displaced bicondylar fracture of right tibia, sequela: Secondary | ICD-10-CM | POA: Diagnosis not present

## 2015-07-16 DIAGNOSIS — M979XXA Periprosthetic fracture around unspecified internal prosthetic joint, initial encounter: Secondary | ICD-10-CM | POA: Diagnosis present

## 2015-07-16 DIAGNOSIS — E0865 Diabetes mellitus due to underlying condition with hyperglycemia: Secondary | ICD-10-CM

## 2015-07-16 DIAGNOSIS — M25561 Pain in right knee: Secondary | ICD-10-CM | POA: Diagnosis present

## 2015-07-16 DIAGNOSIS — E1165 Type 2 diabetes mellitus with hyperglycemia: Secondary | ICD-10-CM | POA: Diagnosis not present

## 2015-07-16 DIAGNOSIS — T148XXA Other injury of unspecified body region, initial encounter: Secondary | ICD-10-CM

## 2015-07-16 DIAGNOSIS — IMO0002 Reserved for concepts with insufficient information to code with codable children: Secondary | ICD-10-CM

## 2015-07-16 DIAGNOSIS — E1169 Type 2 diabetes mellitus with other specified complication: Secondary | ICD-10-CM | POA: Diagnosis present

## 2015-07-16 DIAGNOSIS — M810 Age-related osteoporosis without current pathological fracture: Secondary | ICD-10-CM | POA: Diagnosis present

## 2015-07-16 HISTORY — DX: Periprosthetic fracture around unspecified internal prosthetic joint, initial encounter: M97.9XXA

## 2015-07-16 HISTORY — DX: Morbid (severe) obesity due to excess calories: E66.01

## 2015-07-16 HISTORY — DX: Reserved for concepts with insufficient information to code with codable children: IMO0002

## 2015-07-16 HISTORY — DX: Pituitary-dependent Cushing's disease: E24.0

## 2015-07-16 HISTORY — DX: Type 2 diabetes mellitus with diabetic neuropathy, unspecified: E11.40

## 2015-07-16 HISTORY — DX: Vitamin D deficiency, unspecified: E55.9

## 2015-07-16 HISTORY — DX: Type 2 diabetes mellitus without complications: E11.9

## 2015-07-16 HISTORY — DX: Age-related osteoporosis without current pathological fracture: M81.0

## 2015-07-16 LAB — CBC WITH DIFFERENTIAL/PLATELET
BASOS ABS: 0 10*3/uL (ref 0.0–0.1)
BASOS PCT: 0 %
Basophils Absolute: 0 10*3/uL (ref 0.0–0.1)
Basophils Relative: 0 %
Eosinophils Absolute: 0 10*3/uL (ref 0.0–0.7)
Eosinophils Absolute: 0 10*3/uL (ref 0.0–0.7)
Eosinophils Relative: 0 %
Eosinophils Relative: 0 %
HCT: 39.6 % (ref 36.0–46.0)
HEMATOCRIT: 40 % (ref 36.0–46.0)
HEMOGLOBIN: 12.7 g/dL (ref 12.0–15.0)
Hemoglobin: 12.9 g/dL (ref 12.0–15.0)
LYMPHS ABS: 1.7 10*3/uL (ref 0.7–4.0)
LYMPHS PCT: 14 %
Lymphocytes Relative: 14 %
Lymphs Abs: 1.7 10*3/uL (ref 0.7–4.0)
MCH: 27.5 pg (ref 26.0–34.0)
MCH: 27.4 pg (ref 26.0–34.0)
MCHC: 32.3 g/dL (ref 30.0–36.0)
MCHC: 32.1 g/dL (ref 30.0–36.0)
MCV: 85.3 fL (ref 78.0–100.0)
MCV: 85.5 fL (ref 78.0–100.0)
MONO ABS: 0.5 10*3/uL (ref 0.1–1.0)
MONOS PCT: 4 %
Monocytes Absolute: 0.5 10*3/uL (ref 0.1–1.0)
Monocytes Relative: 4 %
NEUTROS ABS: 10 10*3/uL — AB (ref 1.7–7.7)
NEUTROS ABS: 9.6 10*3/uL — AB (ref 1.7–7.7)
NEUTROS PCT: 82 %
NEUTROS PCT: 82 %
PLATELETS: 373 10*3/uL (ref 150–400)
Platelets: 360 10*3/uL (ref 150–400)
RBC: 4.69 MIL/uL (ref 3.87–5.11)
RBC: 4.63 MIL/uL (ref 3.87–5.11)
RDW: 14.3 % (ref 11.5–15.5)
RDW: 14.3 % (ref 11.5–15.5)
WBC: 12.3 10*3/uL — AB (ref 4.0–10.5)
WBC: 11.8 10*3/uL — ABNORMAL HIGH (ref 4.0–10.5)

## 2015-07-16 LAB — COMPREHENSIVE METABOLIC PANEL
ALBUMIN: 3.3 g/dL — AB (ref 3.5–5.0)
ALT: 25 U/L (ref 14–54)
AST: 38 U/L (ref 15–41)
Alkaline Phosphatase: 97 U/L (ref 38–126)
Anion gap: 8 (ref 5–15)
BILIRUBIN TOTAL: 0.4 mg/dL (ref 0.3–1.2)
BUN: 12 mg/dL (ref 6–20)
CHLORIDE: 104 mmol/L (ref 101–111)
CO2: 27 mmol/L (ref 22–32)
Calcium: 9.1 mg/dL (ref 8.9–10.3)
Creatinine, Ser: 0.67 mg/dL (ref 0.44–1.00)
GFR calc Af Amer: >60 mL/min (ref >60)
GFR calc non Af Amer: >60 mL/min (ref >60)
GLUCOSE: 71 mg/dL (ref 65–99)
POTASSIUM: 4.1 mmol/L (ref 3.5–5.1)
Sodium: 139 mmol/L (ref 135–145)
Total Protein: 7.1 g/dL (ref 6.5–8.1)

## 2015-07-16 LAB — BASIC METABOLIC PANEL
ANION GAP: 9 (ref 5–15)
BUN: 12 mg/dL (ref 6–20)
CALCIUM: 9 mg/dL (ref 8.9–10.3)
CO2: 27 mmol/L (ref 22–32)
CREATININE: 0.64 mg/dL (ref 0.44–1.00)
Chloride: 103 mmol/L (ref 101–111)
Glucose, Bld: 57 mg/dL — ABNORMAL LOW (ref 65–99)
Potassium: 4 mmol/L (ref 3.5–5.1)
Sodium: 139 mmol/L (ref 135–145)

## 2015-07-16 LAB — GLUCOSE, CAPILLARY
GLUCOSE-CAPILLARY: 38 mg/dL — AB (ref 65–99)
GLUCOSE-CAPILLARY: 46 mg/dL — AB (ref 65–99)
Glucose-Capillary: 67 mg/dL (ref 65–99)
Glucose-Capillary: 78 mg/dL (ref 65–99)

## 2015-07-16 LAB — PROTIME-INR
INR: 1.05 (ref 0.00–1.49)
PROTHROMBIN TIME: 13.9 s (ref 11.6–15.2)

## 2015-07-16 LAB — TSH: TSH: 6.272 u[IU]/mL — ABNORMAL HIGH (ref 0.350–4.500)

## 2015-07-16 LAB — APTT: APTT: 28 s (ref 24–37)

## 2015-07-16 LAB — MAGNESIUM: Magnesium: 1.8 mg/dL (ref 1.7–2.4)

## 2015-07-16 LAB — PHOSPHORUS: Phosphorus: 4.6 mg/dL (ref 2.5–4.6)

## 2015-07-16 MED ORDER — VENLAFAXINE HCL ER 37.5 MG PO CP24
37.5000 mg | ORAL_CAPSULE | Freq: Every day | ORAL | Status: DC
  Administered 2015-07-16 – 2015-07-19 (×4): 37.5 mg via ORAL
  Filled 2015-07-16 (×6): qty 1

## 2015-07-16 MED ORDER — ACETAMINOPHEN 650 MG RE SUPP: 650.0000 mg | Freq: Four times a day (QID) | RECTAL | Status: DC | PRN

## 2015-07-16 MED ORDER — FLUTICASONE PROPIONATE 50 MCG/ACT NA SUSP
1.0000 | Freq: Every day | NASAL | Status: DC
Start: 2015-07-17 — End: 2015-07-21
  Administered 2015-07-17 – 2015-07-21 (×3): 1 via NASAL
  Filled 2015-07-16 (×2): qty 16

## 2015-07-16 MED ORDER — ONDANSETRON HCL 4 MG/2ML IJ SOLN: 4.0000 mg | Freq: Four times a day (QID) | INTRAMUSCULAR | Status: DC | PRN

## 2015-07-16 MED ORDER — LORATADINE 10 MG PO TABS
10.0000 mg | ORAL_TABLET | Freq: Every day | ORAL | Status: DC
  Administered 2015-07-17 – 2015-07-21 (×4): 10 mg via ORAL
  Filled 2015-07-16 (×4): qty 1

## 2015-07-16 MED ORDER — ZOLPIDEM TARTRATE 5 MG PO TABS
5.0000 mg | ORAL_TABLET | Freq: Every evening | ORAL | Status: DC | PRN
  Administered 2015-07-20: 5 mg via ORAL
  Filled 2015-07-16: qty 1

## 2015-07-16 MED ORDER — SODIUM CHLORIDE 0.9 % IV SOLN
INTRAVENOUS | Status: DC
  Administered 2015-07-17: 1000 mL via INTRAVENOUS

## 2015-07-16 MED ORDER — IBUPROFEN 400 MG PO TABS
400.0000 mg | ORAL_TABLET | Freq: Two times a day (BID) | ORAL | Status: DC | PRN
  Administered 2015-07-17 (×2): 400 mg via ORAL
  Filled 2015-07-16 (×2): qty 1

## 2015-07-16 MED ORDER — HYDROXYZINE HCL 50 MG PO TABS
100.0000 mg | ORAL_TABLET | Freq: Every day | ORAL | Status: DC
  Administered 2015-07-16 – 2015-07-19 (×3): 100 mg via ORAL
  Filled 2015-07-16 (×7): qty 2

## 2015-07-16 MED ORDER — ONDANSETRON HCL 4 MG PO TABS: 4.0000 mg | ORAL_TABLET | Freq: Four times a day (QID) | ORAL | Status: DC | PRN

## 2015-07-16 MED ORDER — ACETAMINOPHEN 325 MG PO TABS
650.0000 mg | ORAL_TABLET | Freq: Four times a day (QID) | ORAL | Status: DC | PRN
  Administered 2015-07-17 (×2): 650 mg via ORAL
  Filled 2015-07-16 (×2): qty 2

## 2015-07-16 MED ORDER — MORPHINE SULFATE (PF) 4 MG/ML IV SOLN
8.0000 mg | Freq: Once | INTRAVENOUS | Status: AC
  Administered 2015-07-16: 8 mg via INTRAVENOUS
  Filled 2015-07-16: qty 2

## 2015-07-16 MED ORDER — GABAPENTIN 300 MG PO CAPS
600.0000 mg | ORAL_CAPSULE | Freq: Two times a day (BID) | ORAL | Status: DC
  Administered 2015-07-16 – 2015-07-21 (×9): 600 mg via ORAL
  Filled 2015-07-16 (×9): qty 2

## 2015-07-16 MED ORDER — GEMFIBROZIL 600 MG PO TABS
600.0000 mg | ORAL_TABLET | Freq: Two times a day (BID) | ORAL | Status: DC
  Administered 2015-07-17 – 2015-07-21 (×8): 600 mg via ORAL
  Filled 2015-07-16 (×11): qty 1

## 2015-07-16 MED ORDER — OXYCODONE-ACETAMINOPHEN 5-325 MG PO TABS
1.0000 | ORAL_TABLET | Freq: Once | ORAL | Status: AC
  Administered 2015-07-16: 1 via ORAL
  Filled 2015-07-16: qty 1

## 2015-07-16 MED ORDER — VENLAFAXINE HCL ER 75 MG PO CP24
75.0000 mg | ORAL_CAPSULE | Freq: Every day | ORAL | Status: DC
  Administered 2015-07-17 – 2015-07-21 (×4): 75 mg via ORAL
  Filled 2015-07-16 (×7): qty 1

## 2015-07-16 MED ORDER — CHOLESTYRAMINE 4 G PO PACK
4.0000 g | PACK | Freq: Two times a day (BID) | ORAL | Status: DC
  Filled 2015-07-16 (×3): qty 1

## 2015-07-16 MED ORDER — INSULIN ASPART 100 UNIT/ML ~~LOC~~ SOLN
0.0000 [IU] | Freq: Three times a day (TID) | SUBCUTANEOUS | Status: DC
  Administered 2015-07-17: 3 [IU] via SUBCUTANEOUS
  Administered 2015-07-18: 4 [IU] via SUBCUTANEOUS
  Administered 2015-07-18 – 2015-07-20 (×3): 3 [IU] via SUBCUTANEOUS
  Administered 2015-07-20: 4 [IU] via SUBCUTANEOUS
  Administered 2015-07-21: 3 [IU] via SUBCUTANEOUS
  Administered 2015-07-21: 4 [IU] via SUBCUTANEOUS

## 2015-07-16 MED ORDER — GLUCOSE 40 % PO GEL
ORAL | Status: AC
  Administered 2015-07-16: 20:00:00
  Filled 2015-07-16: qty 1

## 2015-07-16 MED ORDER — CANAGLIFLOZIN 300 MG PO TABS
300.0000 mg | ORAL_TABLET | Freq: Every day | ORAL | Status: DC
  Administered 2015-07-17 – 2015-07-21 (×4): 300 mg via ORAL
  Filled 2015-07-16 (×6): qty 300

## 2015-07-16 MED ORDER — LISINOPRIL 5 MG PO TABS
5.0000 mg | ORAL_TABLET | Freq: Every day | ORAL | Status: DC
  Administered 2015-07-17: 5 mg via ORAL
  Filled 2015-07-16 (×2): qty 1

## 2015-07-16 MED ORDER — LEVOTHYROXINE SODIUM 50 MCG PO TABS
50.0000 ug | ORAL_TABLET | Freq: Every day | ORAL | Status: DC
  Administered 2015-07-17 – 2015-07-21 (×5): 50 ug via ORAL
  Filled 2015-07-16 (×6): qty 1

## 2015-07-16 MED ORDER — HYDROMORPHONE HCL 1 MG/ML IJ SOLN
1.0000 mg | INTRAMUSCULAR | Status: DC | PRN
  Administered 2015-07-16 – 2015-07-21 (×16): 1 mg via INTRAVENOUS
  Filled 2015-07-16 (×16): qty 1

## 2015-07-16 MED ORDER — INSULIN ASPART PROT & ASPART (70-30 MIX) 100 UNIT/ML ~~LOC~~ SUSP
50.0000 [IU] | Freq: Two times a day (BID) | SUBCUTANEOUS | Status: DC
  Administered 2015-07-17: 50 [IU] via SUBCUTANEOUS
  Filled 2015-07-16: qty 10

## 2015-07-16 NOTE — ED Provider Notes (Signed)
CSN: [id number]     Arrival date & time 07/16/15  1416 History   First MD Initiated Contact with Patient 07/16/15 1504     Chief Complaint  Patient presents with  . Fall  . Knee Injury     (Consider location/radiation/quality/duration/timing/severity/associated sxs/prior Treatment) Patient is a 39 y.o. female presenting with fall. The history is provided by the patient.  Fall This is a new problem. The current episode started 1 to 2 hours ago (fell from scooter at [employer] while trying to dismount). The problem occurs constantly. The problem has not changed since onset.Pertinent negatives include no chest pain and no abdominal pain. Nothing aggravates the symptoms. Nothing relieves the symptoms. She has tried nothing for the symptoms.    Past Medical History  Diagnosis Date  . Diabetes mellitus type II   . Abdominal wall hernia     mid-abdomen  . OSA on CPAP     SEVERE OSA PER STUDY 01-24-2005  . Major depression, recurrent, chronic   . Generalized anxiety disorder   . Arthralgia of ankle, left     stress foot fx  . Hyperlipidemia   . History of seizures as a child   . 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 Scoliosis of thoracic spine   . Hypertension   . Hypothyroidism   . Wears glasses   . Kidney stones    Past Surgical History  Procedure Laterality Date  . Cesarean section  dec 1997/  06-03-2001/   01-01-2005    BILATERAL TUBAL LIGATION WITH LAST ONE  . Partial knee arthroplasty Right 04/19/2014    Procedure: RIGHT UNI KNEE ARTHROPLASTY MEDIALLY ;  Surgeon: Mauri Pole, MD;  Location: [facility];  Service: Orthopedics;  Laterality: Right;  . Laparoscopic cholecystectomy  09-25-2005  . Dilation and curettage of uterus  1995    WITH SUCTION  . Anterior talofibular ligament repair Left 11/15/2014    Procedure: ANTERIOR TALOFIBULAR LIGAMENT REPAIR;  Surgeon: Jana Half, DPM;  Location: [facility];  Service: Podiatry;   Laterality: Left;   Family History  Problem Relation Age of Onset  . Adopted: Yes  . Autism Son   . ADD / ADHD Son   . Apraxia Son    Social History  Substance Use Topics  . Smoking status: Former Smoker -- 4 years    Types: Cigarettes    Quit date: 07/25/1999  . Smokeless tobacco: Never Used  . Alcohol Use: Yes     Comment: RARE   OB History    No data available     Review of Systems  Cardiovascular: Negative for chest pain.  Gastrointestinal: Negative for abdominal pain.  All other systems reviewed and are negative.     Allergies  Penicillins  Home Medications   Prior to Admission medications   Medication Sig Start Date End Date Taking? Authorizing Provider  canagliflozin (INVOKANA) 300 MG TABS tablet Take 300 mg by mouth daily before breakfast.   Yes Historical Provider, MD  Cetirizine HCl (ZYRTEC ALLERGY) 10 MG CAPS Take 1 capsule (10 mg total) by mouth daily. Patient taking differently: Take 10 mg by mouth daily as needed (allergies).  12/12/14  Yes Tanna Furry, MD  cholestyramine Lucrezia Starch) 4 G packet Take 1 packet (4 g total) by mouth 2 (two) times daily with a meal. 02/11/15  Yes [name], MD  clindamycin (CLEOCIN) 300 MG capsule Take 900 mg  by mouth as needed. Before dental appointments   Yes Historical Provider, MD  fluticasone Asencion Islam) 50 MCG/ACT nasal spray 1 spray each nares bid 12/12/14  Yes Tanna Furry, MD  gabapentin (NEURONTIN) 300 MG capsule Take 600 mg by mouth 2 (two) times daily.    Yes Historical Provider, MD  gemfibrozil (LOPID) 600 MG tablet Take 600 mg by mouth 2 (two) times daily before a meal.   Yes Historical Provider, MD  hydrOXYzine (VISTARIL) 50 MG capsule Take 100 mg by mouth at bedtime. sleep   Yes Historical Provider, MD  Ibuprofen (ADVIL MIGRAINE) 200 MG CAPS Take 400 mg by mouth 2 (two) times daily as needed (pain).   Yes Historical Provider, MD  insulin NPH-regular Human (NOVOLIN 70/30) (70-30) 100 UNIT/ML injection Inject 50 Units  into the skin 2 (two) times daily with a meal.    Yes Historical Provider, MD  levothyroxine (SYNTHROID, LEVOTHROID) 100 MCG tablet Take 50 mcg by mouth every morning.    Yes Historical Provider, MD  quinapril (ACCUPRIL) 5 MG tablet Take 2.5 mg by mouth every morning.    Yes Historical Provider, MD  venlafaxine XR (EFFEXOR-XR) 37.5 MG 24 hr capsule Take 37.5-75 mg by mouth 2 (two) times daily. Take 2 capsules= 75mg  in the morning and take 1 capsule = 37.5mg  at bedtime   Yes Historical Provider, MD  Zolpidem Tartrate (AMBIEN PO) Take 1 tablet by mouth at bedtime as needed (sleep).   Yes Historical Provider, MD   BP 100/46 mmHg  Pulse 109  Temp(Src) 97.9 F (36.6 C) (Oral)  Resp 16  SpO2 92%  LMP  Physical Exam  Constitutional: She is oriented to person, place, and time. She appears well-developed and well-nourished. No distress.  Morbidly obese  HENT:  Head: Normocephalic.  Eyes: Conjunctivae are normal.  Neck: Neck supple. No tracheal deviation present.  Cardiovascular: Normal rate and regular rhythm.   Pulmonary/Chest: Effort normal. No respiratory distress.  Abdominal: Soft. She exhibits no distension.  Musculoskeletal:       Right knee: She exhibits decreased range of motion and swelling. Tenderness found. Medial joint line and lateral joint line tenderness noted.       Right lower leg: She exhibits tenderness. She exhibits no deformity.  Neurological: She is alert and oriented to person, place, and time.  Skin: Skin is warm and dry.  Psychiatric: She has a normal mood and affect.    ED Course  Procedures (including critical care time) Labs Review Labs Reviewed  CBC WITH DIFFERENTIAL/PLATELET  BASIC METABOLIC PANEL    Imaging Review Dg Knee Complete 4 Views Right  07/16/2015  CLINICAL DATA:  Right knee pain after tripping over her foot and falling today. Status post partial right knee replacement this year. EXAM: RIGHT KNEE - COMPLETE 4+ VIEW COMPARISON:  None. FINDINGS:  Comminuted fracture involving the medial and lateral tibial plateaus with extension into the central joint space. There is also a medial knee prosthesis. Also noted is a nondisplaced transverse fracture of the fibular head. Probable small effusion. Minimal posterior patellar spur formation. IMPRESSION: 1. Essentially nondisplaced comminuted fracture of the medial and lateral tibial plateaus with intra-articular extension centrally. 2. Nondisplaced fibular head fracture. 3. Minimal patellofemoral degenerative change. 4. Medial knee prosthesis. Electronically Signed   By: Claudie Revering M.D.   On: 07/16/2015 15:26   I have personally reviewed and evaluated these images and lab results as part of my medical decision-making.   EKG Interpretation None  MDM   Final diagnoses:  Tibial plateau fracture, right, closed, initial encounter  Right fibular fracture, closed, initial encounter    39 y.o. female presents with fall from standing sustaining periprosthetic right tibial plateau fracture and right fibula fracture. Unfortunately Pt is unable to ambulate 2/2 morbid obesity and previous left ankle injury so unsafe for discharge. D/w Pt's orthopedic group o/c who will see her in consultation. Hospitalist was consulted for admission and will see the patient in the emergency department.     Leo Grosser, MD 07/18/15 [phone]

## 2015-07-16 NOTE — ED Notes (Signed)
Pt was shopping today when she had a fall. Fell forward onto R knee. Pt felt a pop and crackling during fall. Hx of knee replacement on that knee a year ago. Pt was unable to bear weight or move knee. Was able to move ankle distal to knee. Pain worse in popliteal region. No obvious deformity noted.

## 2015-07-16 NOTE — Progress Notes (Signed)
RT assisted patient with set up of her CPAP machine from home.  Patient is familiar with equipment and procedure and is able to self administer CPAP when she is ready for bed.  Machine appears to be in excellent working condition, humidifier filled with sterile water.

## 2015-07-16 NOTE — H&P (Addendum)
[facility] History and Physical  Jolee Critcher Risby LFY:[id number] DOB: 01-Feb-1976 DOA: 07/16/2015  Referring physician: ER physician: Dr. Leo Grosser  PCP: Vladimir Creeks, MD  Chief Complaint: fall and right knee pain  HPI:  39 year old female with past medical history of morbid obesity, hypertension, dyslipidemia, hypothyroidism who presented to [facility] ED status post fall while shopping. She just fell but had no prodromal symptoms such as lightheadedness or shortness of breath or palpitations. No chest pain. No reports of loss of consciousness. Her pain is in the right knee, sharp, 10/10 in intensity, non radiating, sharp and partially relieved with morphine given in ED. No blood in stool. No abdominal pain. No nausea or vomiting. No fevers. No cough.   In ED, BP was stable. Blood work showed mild leukocytosis of 12.3. X rays demonstrated right tibial plateau fracture. Ortho to see her in consultation.   Assessment & Plan    Principal Problem:   Fracture, tibial plateau - S/P mechanical fall - Appreciate orthopedic surgery consult and recommendations - Continue pain management efforts  Active Problems:   Major depressive disorder, recurrent episode, mild (HCC) - Continue Effexor - Stable, not depressed    Hyperlipidemia - Continue gemfibrozil    Cushing disease ([place]) / Morbid obesity ([place]) - Stable - Nutrition consulted     Hypothyroid - Continue synthroid       Uncontrolled diabetes mellitus with diabetic neuropathy, with long-term current use of insulin (HCC) - Check A1c - Continue insulin per home regimen - Continue invokana - Add SSI - Continue gabapentin for neuropathy     Leukocytosis - Likely reactive - No obvious source of infection    Essential hypertension - Continue lisinopril  DVT prophylaxis:  - SCD's bilaterally   Radiological Exams on Admission: Dg Knee Complete 4 Views Right 07/16/2015  1. Essentially nondisplaced comminuted fracture of the  medial and lateral tibial plateaus with intra-articular extension centrally. 2. Nondisplaced fibular head fracture. 3. Minimal patellofemoral degenerative change. 4. Medial knee prosthesis. Electronically Signed   By: Claudie Revering M.D.   On: 07/16/2015 15:26    Code Status: Full Family Communication: Plan of care discussed with the patient  Disposition Plan: Admit for further evaluation  Leisa Lenz, MD  [facility] Pager [phone]  Time spent in minutes: 55 minutes  Review of Systems:  Constitutional: Negative for fever, chills and malaise/fatigue. Negative for diaphoresis.  HENT: Negative for hearing loss, ear pain, nosebleeds, congestion, sore throat, neck pain, tinnitus and ear discharge.   Eyes: Negative for blurred vision, double vision, photophobia, pain, discharge and redness.  Respiratory: Negative for cough, hemoptysis, sputum production, shortness of breath, wheezing and stridor.   Cardiovascular: Negative for chest pain, palpitations, orthopnea, claudication and leg swelling.  Gastrointestinal: Negative for nausea, vomiting and abdominal pain. Negative for heartburn, constipation, blood in stool and melena.  Genitourinary: Negative for dysuria, urgency, frequency, hematuria and flank pain.  Musculoskeletal: per HPI Skin: Negative for itching and rash.  Neurological: Negative for dizziness and weakness. Negative for tingling, tremors, sensory change, speech change, focal weakness, loss of consciousness and headaches.  Endo/Heme/Allergies: Negative for environmental allergies and polydipsia. Does not bruise/bleed easily.  Psychiatric/Behavioral: Negative for suicidal ideas. The patient is not nervous/anxious.      Past Medical History  Diagnosis Date  . Hyperlipidemia    Past Surgical History  Procedure Laterality Date  . Cesarean section  dec 1997/  06-03-2001/   01-01-2005    BILATERAL TUBAL LIGATION WITH LAST ONE  .  Partial knee arthroplasty Right 04/19/2014     Procedure: RIGHT UNI KNEE ARTHROPLASTY MEDIALLY ;  Surgeon: Mauri Pole, MD;  Location: [facility];  Service: Orthopedics;  Laterality: Right;  . Laparoscopic cholecystectomy  09-25-2005  . Dilation and curettage of uterus  1995    WITH SUCTION  . Anterior talofibular ligament repair Left 11/15/2014    Procedure: ANTERIOR TALOFIBULAR LIGAMENT REPAIR;  Surgeon: Jana Half, DPM;  Location: [place];  Service: Podiatry;  Laterality: Left;   Social History:  reports that she quit smoking about 15 years ago. Her smoking use included Cigarettes. She quit after 4 years of use. She has never used smokeless tobacco. She reports that she drinks alcohol. She reports that she does not use illicit drugs.  Allergies  Allergen Reactions  . Penicillins Hives    Has patient had a PCN reaction causing immediate rash, facial/tongue/throat swelling, SOB or lightheadedness with hypotension: Yes Has patient had a PCN reaction causing severe rash involving mucus membranes or skin necrosis: Yes Has patient had a PCN reaction that required hospitalization No Has patient had a PCN reaction occurring within the last 10 years: No If all of the above answers are "NO", then may proceed with Cephalosporin use.     Family History:  Family History  Problem Relation Age of Onset  . Adopted: Yes  . Autism Son   . ADD / ADHD Son   . Apraxia Son      Prior to Admission medications   Medication Sig Start Date End Date Taking? Authorizing Provider  canagliflozin (INVOKANA) 300 MG TABS tablet Take 300 mg by mouth daily before breakfast.   Yes Historical Provider, MD  cholestyramine Lucrezia Starch) 4 G packet Take 1 packet (4 g total) by mouth 2 (two) times daily with a meal. 02/11/15  Yes [name], MD  clindamycin (CLEOCIN) 300 MG capsule Take 900 mg by mouth as needed. Before dental appointments   Yes Historical Provider, MD  fluticasone Asencion Islam) 50 MCG/ACT nasal spray 1 spray each nares bid 12/12/14   Yes Tanna Furry, MD  gabapentin (NEURONTIN) 300 MG capsule Take 600 mg by mouth 2 (two) times daily.    Yes Historical Provider, MD  gemfibrozil (LOPID) 600 MG tablet Take 600 mg by mouth 2 (two) times daily before a meal.   Yes Historical Provider, MD  hydrOXYzine (VISTARIL) 50 MG capsule Take 100 mg by mouth at bedtime. sleep   Yes Historical Provider, MD  Ibuprofen (ADVIL MIGRAINE) 200 MG CAPS Take 400 mg by mouth 2 (two) times daily as needed (pain).   Yes Historical Provider, MD  insulin NPH-regular Human (NOVOLIN 70/30) (70-30) 100 UNIT/ML injection Inject 50 Units into the skin 2 (two) times daily with a meal.    Yes Historical Provider, MD  levothyroxine (SYNTHROID, LEVOTHROID) 100 MCG tablet Take 50 mcg by mouth every morning.    Yes Historical Provider, MD  quinapril (ACCUPRIL) 5 MG tablet Take 2.5 mg by mouth every morning.    Yes Historical Provider, MD  venlafaxine XR (EFFEXOR-XR) 37.5 MG 24 hr capsule Take 37.5-75 mg by mouth 2 (two) times daily. Take 2 capsules= 75mg  in the morning and take 1 capsule = 37.5mg  at bedtime   Yes Historical Provider, MD  Zolpidem Tartrate (AMBIEN PO) Take 1 tablet by mouth at bedtime as needed (sleep).   Yes Historical Provider, MD   Physical Exam: Filed Vitals:   07/16/15 1434 07/16/15 1713 07/16/15 1848 07/16/15 1906  BP:  109/53 100/46 99/44 154/62  Pulse: 86 109 102 103  Temp: 97.9 F (36.6 C)   98.4 F (36.9 C)  TempSrc: Oral   Oral  Resp: 16 16 16 22   SpO2: 93% 92% 92% 97%    Physical Exam  Constitutional: Appears morbidly obese, no dsitress HENT: Normocephalic. No tonsillar erythema or exudates Eyes: Conjunctivae are normal. No scleral icterus.  Neck: Normal ROM. Neck supple. No JVD. No tracheal deviation. No thyromegaly.  CVS: RRR, S1/S2 +, no murmurs, no gallops, no carotid bruit.  Pulmonary: Effort and breath sounds normal, no stridor, rhonchi, wheezes, rales.  Abdominal: Soft. BS +,  no distension, tenderness, rebound or guarding.   Musculoskeletal: pain in the right knee, no edema  Lymphadenopathy: No lymphadenopathy noted, cervical, inguinal. Neuro: Alert. Normal reflexes, muscle tone coordination. No focal neurologic deficits. Skin: Skin is warm and dry. No rash noted.  No erythema. No pallor.  Psychiatric: Normal mood and affect. Behavior, judgment, thought content normal.   Labs on Admission:  Basic Metabolic Panel:  Recent Labs Lab 07/16/15 1727 07/16/15 1950  NA 139 139  K 4.0 4.1  CL 103 104  CO2 27 27  GLUCOSE 57* 71  BUN 12 12  CREATININE 0.64 0.67  CALCIUM 9.0 9.1  MG  --  1.8  PHOS  --  4.6   Liver Function Tests:  Recent Labs Lab 07/16/15 1950  AST 38  ALT 25  ALKPHOS 97  BILITOT 0.4  PROT 7.1  ALBUMIN 3.3*   No results for input(s): LIPASE, AMYLASE in the last 168 hours. No results for input(s): AMMONIA in the last 168 hours. CBC:  Recent Labs Lab 07/16/15 1727 07/16/15 1950  WBC 12.3* 11.8*  NEUTROABS 10.0* 9.6*  HGB 12.9 12.7  HCT 40.0 39.6  MCV 85.3 85.5  PLT 360 373   Cardiac Enzymes: No results for input(s): CKTOTAL, CKMB, CKMBINDEX, TROPONINI in the last 168 hours. BNP: Invalid input(s): POCBNP CBG:  Recent Labs Lab 07/16/15 1911 07/16/15 1939 07/16/15 2032  GLUCAP 38* 46* 67    If 7PM-7AM, please contact night-coverage [email or web address] Password [email or web address] 07/16/2015, 8:54 PM

## 2015-07-16 NOTE — ED Notes (Signed)
[facility] left phone number [phone]

## 2015-07-16 NOTE — Progress Notes (Signed)
Glucose gel given for low CBG. Paolo Okane, [employer]

## 2015-07-16 NOTE — ED Notes (Signed)
Bed: [facility] Expected date:  Expected time:  Means of arrival:  Comments: fall 

## 2015-07-17 ENCOUNTER — Encounter (HOSPITAL_COMMUNITY): Payer: Self-pay | Admitting: *Deleted

## 2015-07-17 LAB — COMPREHENSIVE METABOLIC PANEL
ALBUMIN: 3.3 g/dL — AB (ref 3.5–5.0)
ALT: 33 U/L (ref 14–54)
ANION GAP: 8 (ref 5–15)
AST: 37 U/L (ref 15–41)
Alkaline Phosphatase: 104 U/L (ref 38–126)
BILIRUBIN TOTAL: 0.7 mg/dL (ref 0.3–1.2)
BUN: 12 mg/dL (ref 6–20)
CHLORIDE: 102 mmol/L (ref 101–111)
CO2: 28 mmol/L (ref 22–32)
Calcium: 8.8 mg/dL — ABNORMAL LOW (ref 8.9–10.3)
Creatinine, Ser: 0.69 mg/dL (ref 0.44–1.00)
GFR calc Af Amer: >60 mL/min (ref >60)
GFR calc non Af Amer: >60 mL/min (ref >60)
GLUCOSE: 138 mg/dL — AB (ref 65–99)
POTASSIUM: 4.7 mmol/L (ref 3.5–5.1)
SODIUM: 138 mmol/L (ref 135–145)
TOTAL PROTEIN: 6.9 g/dL (ref 6.5–8.1)

## 2015-07-17 LAB — GLUCOSE, CAPILLARY
GLUCOSE-CAPILLARY: 125 mg/dL — AB (ref 65–99)
GLUCOSE-CAPILLARY: 47 mg/dL — AB (ref 65–99)
GLUCOSE-CAPILLARY: 57 mg/dL — AB (ref 65–99)
GLUCOSE-CAPILLARY: 119 mg/dL — AB (ref 65–99)
Glucose-Capillary: 106 mg/dL — ABNORMAL HIGH (ref 65–99)
Glucose-Capillary: 149 mg/dL — ABNORMAL HIGH (ref 65–99)
Glucose-Capillary: 86 mg/dL (ref 65–99)
Glucose-Capillary: 88 mg/dL (ref 65–99)

## 2015-07-17 LAB — CBC
HEMATOCRIT: 39.3 % (ref 36.0–46.0)
HEMOGLOBIN: 12.2 g/dL (ref 12.0–15.0)
MCH: 26.9 pg (ref 26.0–34.0)
MCHC: 31 g/dL (ref 30.0–36.0)
MCV: 86.6 fL (ref 78.0–100.0)
Platelets: 364 10*3/uL (ref 150–400)
RBC: 4.54 MIL/uL (ref 3.87–5.11)
RDW: 14.6 % (ref 11.5–15.5)
WBC: 10.2 10*3/uL (ref 4.0–10.5)

## 2015-07-17 MED ORDER — DEXTROSE 50 % IV SOLN
INTRAVENOUS | Status: AC
  Administered 2015-07-17: 25 mL
  Filled 2015-07-17: qty 50

## 2015-07-17 MED ORDER — METHOCARBAMOL 500 MG PO TABS
500.0000 mg | ORAL_TABLET | Freq: Once | ORAL | Status: AC
  Administered 2015-07-17: 500 mg via ORAL
  Filled 2015-07-17: qty 1

## 2015-07-17 MED ORDER — PNEUMOCOCCAL VAC POLYVALENT 25 MCG/0.5ML IJ INJ
0.5000 mL | INJECTION | INTRAMUSCULAR | Status: AC
  Administered 2015-07-21: 0.5 mL via INTRAMUSCULAR
  Filled 2015-07-17 (×4): qty 0.5

## 2015-07-17 MED ORDER — INFLUENZA VAC SPLIT QUAD 0.5 ML IM SUSY
0.5000 mL | PREFILLED_SYRINGE | INTRAMUSCULAR | Status: AC
Start: 2015-07-18 — End: 2015-07-21
  Administered 2015-07-21: 0.5 mL via INTRAMUSCULAR
  Filled 2015-07-17 (×4): qty 0.5

## 2015-07-17 MED ORDER — CHOLESTYRAMINE 4 G PO PACK
4.0000 g | PACK | Freq: Every day | ORAL | Status: DC
  Administered 2015-07-17 – 2015-07-21 (×4): 4 g via ORAL
  Filled 2015-07-17 (×4): qty 1

## 2015-07-17 MED ORDER — INSULIN ASPART PROT & ASPART (70-30 MIX) 100 UNIT/ML ~~LOC~~ SUSP
25.0000 [IU] | Freq: Two times a day (BID) | SUBCUTANEOUS | Status: DC
  Administered 2015-07-18 – 2015-07-21 (×7): 25 [IU] via SUBCUTANEOUS
  Filled 2015-07-17: qty 10

## 2015-07-17 NOTE — Progress Notes (Signed)
Pt stated that she will self administer home CPAP when ready for sleep.  Pt to notify RT if any assistance is required through the night.  RT to monitor and assess as needed.

## 2015-07-17 NOTE — Progress Notes (Signed)
RT called to bedside for Pt's O2 stats being low on CPAP, Pt currently is on room air with home CPAP unit.  RT added 6 LPM O2 through CPAP to achieve a sat of 95%.  Pt currently tolerating well at this time, RT to monitor and assess as needed.

## 2015-07-17 NOTE — Progress Notes (Addendum)
Patient ID: [name], female   DOB: [birth year], 39 y.o.   MRN: [id number] [facility] PROGRESS NOTE  [name] NWG:[id number] DOB: February 16, 1976 DOA: 07/16/2015 PCP: Vladimir Creeks, MD  Brief narrative:    39 year old female with past medical history of morbid obesity, hypertension, dyslipidemia, hypothyroidism who presented to [facility] ED status post fall while shopping.  In ED, BP was stable. Blood work showed mild leukocytosis of 12.3. X rays demonstrated right tibial plateau fracture. Ortho to see her in consultation.   Anticipated discharge: Needs to be seen by ortho.  Assessment/Plan:     Principal Problem:  Fracture, tibial plateau - S/P mechanical fall - Appreciate orthopedic surgery consult  - Continue pain management efforts  Active Problems:  Major depressive disorder, recurrent episode, mild (HCC) - Continue Effexor   Hyperlipidemia - Continue gemfibrozil   Cushing disease ([place]) / Morbid obesity ([place]) - Nutrition consulted  - Counseled on diet    Hypothyroid - Continue synthroid     Uncontrolled diabetes mellitus with diabetic neuropathy, with long-term current use of insulin (HCC) - A1c is pending  - Continue current insulin regimen - Continue invokana - Continue gabapentin for diabetic neuropathy    Leukocytosis - Likely reactive - No obvious source of infection   Essential hypertension - Continue lisinopril - BP 115/59  DVT prophylaxis:  - SCD's bilaterally in hospital    DVT Prophylaxis    Code Status: Full.  Family Communication:  plan of care discussed with the patient Disposition Plan: needs to be seen by orho  IV access:  Peripheral IV  Procedures and diagnostic studies:    Dg Knee Complete 4 Views Right 07/16/2015 1. Essentially nondisplaced comminuted fracture of the medial and lateral tibial plateaus with intra-articular extension centrally. 2. Nondisplaced fibular head fracture. 3. Minimal patellofemoral  degenerative change. 4. Medial knee prosthesis.  Medical Consultants:  Ortho  Other Consultants:  PT  IAnti-Infectives:   None    Leisa Lenz, MD  [facility] Pager [phone]  Time spent in minutes: 15 minutes  If 7PM-7AM, please contact night-coverage [email or web address] Password [email or web address] 07/17/2015, 1:03 PM   LOS: 1 day    HPI/Subjective: No acute overnight events. Patient reports pain is still about 8/10 in intensity.   Objective: Filed Vitals:   07/16/15 1906 07/16/15 2150 07/17/15 0500 07/17/15 0746  BP: 154/62 124/62 115/59   Pulse: 103 106 96   Temp: 98.4 F (36.9 C) 98.2 F (36.8 C) 97.4 F (36.3 C)   TempSrc: Oral Oral Oral   Resp: 22  20   Height:    5\' 2"  (1.575 m)  SpO2: 97% 96% 93%     Intake/Output Summary (Last 24 hours) at 07/17/15 1303 Last data filed at 07/17/15 1147  Gross per 24 hour  Intake    370 ml  Output    775 ml  Net   -405 ml    Exam:   General:  Pt is alert, not in acute distress  Cardiovascular: Regular rate and rhythm, S1/S2, no murmurs  Respiratory: Clear to auscultation bilaterally, no wheezing, no crackles, no rhonchi  Abdomen: Soft, non tender, non distended, bowel sounds present  Extremities: No edema, pulses DP and PT palpable bilaterally  Neuro: Grossly nonfocal  Data Reviewed: Basic Metabolic Panel:  Recent Labs Lab 07/16/15 1727 07/16/15 1950 07/17/15 0440  NA 139 139 138  K 4.0 4.1 4.7  CL 103 104 102  CO2 27 27 28   GLUCOSE 57* 71 138*  BUN  12 12 12   CREATININE 0.64 0.67 0.69  CALCIUM 9.0 9.1 8.8*  MG  --  1.8  --   PHOS  --  4.6  --    Liver Function Tests:  Recent Labs Lab 07/16/15 1950 07/17/15 0440  AST 38 37  ALT 25 33  ALKPHOS 97 104  BILITOT 0.4 0.7  PROT 7.1 6.9  ALBUMIN 3.3* 3.3*   No results for input(s): LIPASE, AMYLASE in the last 168 hours. No results for input(s): AMMONIA in the last 168 hours. CBC:  Recent Labs Lab 07/16/15 1727 07/16/15 1950 07/17/15 0440   WBC 12.3* 11.8* 10.2  NEUTROABS 10.0* 9.6*  --   HGB 12.9 12.7 12.2  HCT 40.0 39.6 39.3  MCV 85.3 85.5 86.6  PLT 360 373 364   Cardiac Enzymes: No results for input(s): CKTOTAL, CKMB, CKMBINDEX, TROPONINI in the last 168 hours. BNP: Invalid input(s): POCBNP CBG:  Recent Labs Lab 07/17/15 0305 07/17/15 0754 07/17/15 1204 07/17/15 1225 07/17/15 1257  GLUCAP 106* 125* 57* 47* 88    No results found for this or any previous visit (from the past 240 hour(s)).   Scheduled Meds: . canagliflozin  300 mg Oral QAC breakfast  . cholestyramine  4 g Oral BID WC  . fluticasone  1 spray Each Nare Daily  . gabapentin  600 mg Oral BID  . gemfibrozil  600 mg Oral BID AC  . hydrOXYzine  100 mg Oral QHS  . [START ON 07/18/2015] Influenza vac split quadrivalent PF  0.5 mL Intramuscular Tomorrow-1000  . insulin aspart  0-20 Units Subcutaneous TID WC  . insulin aspart protamine- aspart  50 Units Subcutaneous BID WC  . levothyroxine  50 mcg Oral QAC breakfast  . lisinopril  5 mg Oral Daily  . loratadine  10 mg Oral Daily  . [START ON 07/18/2015] pneumococcal 23 valent vaccine  0.5 mL Intramuscular Tomorrow-1000  . venlafaxine XR  37.5 mg Oral QHS  . venlafaxine XR  75 mg Oral Q breakfast   Continuous Infusions: . sodium chloride 1,000 mL (07/17/15 0323)

## 2015-07-17 NOTE — Progress Notes (Signed)
Hypoglycemic Event  CBG: 57  Treatment: D50 IV 25 mL After giving one orange juice with followup CBG=47@ 1225.   Symptoms: None  Follow-up CBG: Time:1257 CBG Result:88  Possible Reasons for Event: Inadequate meal intake NPO  Comments/MD notified:Dr Charlies Silvers via text    [name], [employer]

## 2015-07-17 NOTE — Progress Notes (Signed)
Pt having hypoglycemic event, not responding to OJ. Dr Charlies Silvers notified with text page. Yuna Pizzolato, [employer]

## 2015-07-18 ENCOUNTER — Encounter (HOSPITAL_COMMUNITY): Payer: Self-pay | Admitting: Orthopedic Surgery

## 2015-07-18 ENCOUNTER — Inpatient Hospital Stay (HOSPITAL_COMMUNITY): Payer: 59

## 2015-07-18 DIAGNOSIS — M979XXA Periprosthetic fracture around unspecified internal prosthetic joint, initial encounter: Secondary | ICD-10-CM

## 2015-07-18 HISTORY — DX: Periprosthetic fracture around unspecified internal prosthetic joint, initial encounter: M97.9XXA

## 2015-07-18 LAB — PROTIME-INR
INR: 1.05 (ref 0.00–1.49)
PROTHROMBIN TIME: 13.9 s (ref 11.6–15.2)

## 2015-07-18 LAB — CBC
HEMATOCRIT: 37.7 % (ref 36.0–46.0)
Hemoglobin: 11.9 g/dL — ABNORMAL LOW (ref 12.0–15.0)
MCH: 27.2 pg (ref 26.0–34.0)
MCHC: 31.6 g/dL (ref 30.0–36.0)
MCV: 86.3 fL (ref 78.0–100.0)
PLATELETS: 364 10*3/uL (ref 150–400)
RBC: 4.37 MIL/uL (ref 3.87–5.11)
RDW: 14.5 % (ref 11.5–15.5)
WBC: 8.5 10*3/uL (ref 4.0–10.5)

## 2015-07-18 LAB — COMPREHENSIVE METABOLIC PANEL
ALT: 25 U/L (ref 14–54)
ANION GAP: 6 (ref 5–15)
AST: 17 U/L (ref 15–41)
Albumin: 3.1 g/dL — ABNORMAL LOW (ref 3.5–5.0)
Alkaline Phosphatase: 83 U/L (ref 38–126)
BILIRUBIN TOTAL: 0.6 mg/dL (ref 0.3–1.2)
BUN: 16 mg/dL (ref 6–20)
CO2: 28 mmol/L (ref 22–32)
Calcium: 9.1 mg/dL (ref 8.9–10.3)
Chloride: 104 mmol/L (ref 101–111)
Creatinine, Ser: 0.69 mg/dL (ref 0.44–1.00)
Glucose, Bld: 149 mg/dL — ABNORMAL HIGH (ref 65–99)
POTASSIUM: 4.4 mmol/L (ref 3.5–5.1)
Sodium: 138 mmol/L (ref 135–145)
TOTAL PROTEIN: 7.2 g/dL (ref 6.5–8.1)

## 2015-07-18 LAB — GLUCOSE, CAPILLARY
GLUCOSE-CAPILLARY: 127 mg/dL — AB (ref 65–99)
GLUCOSE-CAPILLARY: 155 mg/dL — AB (ref 65–99)
GLUCOSE-CAPILLARY: 131 mg/dL — AB (ref 65–99)
GLUCOSE-CAPILLARY: 113 mg/dL — AB (ref 65–99)
GLUCOSE-CAPILLARY: 90 mg/dL (ref 65–99)
Glucose-Capillary: 94 mg/dL (ref 65–99)

## 2015-07-18 LAB — HEMOGLOBIN A1C
Hgb A1c MFr Bld: 7 % — ABNORMAL HIGH (ref 4.8–5.6)
Mean Plasma Glucose: 154 mg/dL

## 2015-07-18 LAB — APTT: aPTT: 29 seconds (ref 24–37)

## 2015-07-18 MED ORDER — SODIUM CHLORIDE 0.9 % IV BOLUS (SEPSIS)
250.0000 mL | Freq: Once | INTRAVENOUS | Status: AC
  Administered 2015-07-18: 250 mL via INTRAVENOUS

## 2015-07-18 MED ORDER — DEXTROSE 5 % IV SOLN: 3.0000 g | Freq: Once | INTRAVENOUS | Status: DC

## 2015-07-18 MED ORDER — HYDROCODONE-ACETAMINOPHEN 7.5-325 MG PO TABS
1.0000 | ORAL_TABLET | Freq: Four times a day (QID) | ORAL | Status: DC | PRN
  Administered 2015-07-18: 2 via ORAL
  Administered 2015-07-19: 1 via ORAL
  Administered 2015-07-19 – 2015-07-20 (×3): 2 via ORAL
  Filled 2015-07-18 (×4): qty 2

## 2015-07-18 MED ORDER — CLINDAMYCIN PHOSPHATE 900 MG/50ML IV SOLN
900.0000 mg | INTRAVENOUS | Status: AC
  Administered 2015-07-19: 900 mg via INTRAVENOUS
  Filled 2015-07-18 (×2): qty 50

## 2015-07-18 NOTE — Clinical Documentation Improvement (Signed)
Internal Medicine  Can the diagnosis of morbid obesity be further specified? Please update your documentation within the medical record to reflect your response to this query. Thank you!   Other  Clinically Undetermined  Document any associated diagnoses/conditions.  Supporting Information:  Please link the patient's BMI to her morbid obesity. > 40, < 40, other, unknown  Patient is 5'2" tall  Please exercise your independent, professional judgment when responding. A specific answer is not anticipated or expected.  Thank You,  Zoila Shutter RN, [place] [phone]; Cell: [phone]

## 2015-07-18 NOTE — Progress Notes (Signed)
Orthopaedic Trauma Service (OTS)  Reason for Consult: Right periprosthetic tibial plateau fracture Referring Physician: Leisa Lenz, MD (medicine)   HPI: [name] is an 39 y.o.obese, white female with history of Cushing's disease, morbid obesity, diabetes hypertension who sustained a ground-level fall while at [facility] on 07/16/2015. Patient sustained a right periprosthetic tibial plateau fracture. Orthopedic consult was obtained for evaluation. Patient seen and evaluated today 07/18/2015. Her x-rays are reviewed and it was felt that her fracture pattern should be treated surgically. Patient husband in room. Patient complains of pretty significant pain in her right. Patient's blood pressures have been relatively soft this entire day and there is been some concern with giving her some a medicine. Patient feels well otherwise no other complaints. She was not on any ambulatory devices prior to this fall. She did have a ankle procedure performed about 6 months ago by a podiatrist here in town. States that she does have hardware in the left ankle. She states that she is still somewhat weak in her left ankle as well. She does live in a single-story house with 2 stairs to get in. Patient lives with her husband as well as her 43 year old autistic son.  Patient is on a CPAP chronically Her O2 sats have been in the low 90s high 80s during her hospital stay Blood pressures have also been quite stop her entire stay. With her systolic pressures in the low 100s to 11H and diastolic in the 41D and 40C  Past Medical History  Diagnosis Date  . Hyperlipidemia   . Cushing's disease ([place])   . Morbid obesity ([place])   . Diabetes ([place])   . Hyperlipidemia   . Hypertension     Past Surgical History  Procedure Laterality Date  . Cesarean section  dec 1997/  06-03-2001/   01-01-2005    BILATERAL TUBAL LIGATION WITH LAST ONE  . Partial knee arthroplasty Right 04/19/2014    Procedure: RIGHT UNI KNEE  ARTHROPLASTY MEDIALLY ;  Surgeon: Mauri Pole, MD;  Location: [facility];  Service: Orthopedics;  Laterality: Right;  . Laparoscopic cholecystectomy  09-25-2005  . Dilation and curettage of uterus  1995    WITH SUCTION  . Anterior talofibular ligament repair Left 11/15/2014    Procedure: ANTERIOR TALOFIBULAR LIGAMENT REPAIR;  Surgeon: Jana Half, DPM;  Location: [facility];  Service: Podiatry;  Laterality: Left;    Family History  Problem Relation Age of Onset  . Adopted: Yes  . Autism Son   . ADD / ADHD Son   . Apraxia Son     Social History:  reports that she quit smoking about 15 years ago. Her smoking use included Cigarettes. She quit after 4 years of use. She has never used smokeless tobacco. She reports that she drinks alcohol. She reports that she does not use illicit drugs.  Allergies:  Allergies  Allergen Reactions  . Penicillins Hives    Has patient had a PCN reaction causing immediate rash, facial/tongue/throat swelling, SOB or lightheadedness with hypotension: Yes Has patient had a PCN reaction causing severe rash involving mucus membranes or skin necrosis: Yes Has patient had a PCN reaction that required hospitalization No Has patient had a PCN reaction occurring within the last 10 years: No If all of the above answers are "NO", then may proceed with Cephalosporin use.     Medications:  I have reviewed the patient's current medications. Prior to Admission:  Prescriptions prior to admission  Medication Sig Dispense Refill  Last Dose  . canagliflozin (INVOKANA) 300 MG TABS tablet Take 300 mg by mouth daily before breakfast.   07/16/2015 at 1000  . Cetirizine HCl (ZYRTEC ALLERGY) 10 MG CAPS Take 1 capsule (10 mg total) by mouth daily. (Patient taking differently: Take 10 mg by mouth daily as needed (allergies). ) 30 capsule 1 unknown  . cholestyramine (QUESTRAN) 4 G packet Take 1 packet (4 g total) by mouth 2 (two) times daily with a meal. 60 each 12  07/15/2015 at Unknown time  . clindamycin (CLEOCIN) 300 MG capsule Take 900 mg by mouth as needed. Before dental appointments   unknown  . fluticasone (FLONASE) 50 MCG/ACT nasal spray 1 spray each nares bid 10 g 1 Past Week at Unknown time  . gabapentin (NEURONTIN) 300 MG capsule Take 600 mg by mouth 2 (two) times daily.    07/16/2015 at Unknown time  . gemfibrozil (LOPID) 600 MG tablet Take 600 mg by mouth 2 (two) times daily before a meal.   07/16/2015 at Unknown time  . hydrOXYzine (VISTARIL) 50 MG capsule Take 100 mg by mouth at bedtime. sleep   07/15/2015 at Unknown time  . Ibuprofen (ADVIL MIGRAINE) 200 MG CAPS Take 400 mg by mouth 2 (two) times daily as needed (pain).   07/16/2015 at Unknown time  . insulin NPH-regular Human (NOVOLIN 70/30) (70-30) 100 UNIT/ML injection Inject 50 Units into the skin 2 (two) times daily with a meal.    07/16/2015 at Unknown time  . levothyroxine (SYNTHROID, LEVOTHROID) 100 MCG tablet Take 50 mcg by mouth every morning.    07/16/2015 at Unknown time  . quinapril (ACCUPRIL) 5 MG tablet Take 2.5 mg by mouth every morning.    07/16/2015 at Unknown time  . venlafaxine XR (EFFEXOR-XR) 37.5 MG 24 hr capsule Take 37.5-75 mg by mouth 2 (two) times daily. Take 2 capsules= 58m in the morning and take 1 capsule = 37.544mat bedtime   07/16/2015 at Unknown time  . Zolpidem Tartrate (AMBIEN PO) Take 1 tablet by mouth at bedtime as needed (sleep).   07/15/2015 at Unknown time    Results for orders placed or performed during the hospital encounter of 07/16/15 (from the past 48 hour(s))  CBC with Differential/Platelet     Status: Abnormal   Collection Time: 07/16/15  5:27 PM  Result Value Ref Range   WBC 12.3 (H) 4.0 - 10.5 K/uL   RBC 4.69 3.87 - 5.11 MIL/uL   Hemoglobin 12.9 12.0 - 15.0 g/dL   HCT 40.0 36.0 - 46.0 %   MCV 85.3 78.0 - 100.0 fL   MCH 27.5 26.0 - 34.0 pg   MCHC 32.3 30.0 - 36.0 g/dL   RDW 14.3 11.5 - 15.5 %   Platelets 360 150 - 400 K/uL   Neutrophils Relative %  82 %   Neutro Abs 10.0 (H) 1.7 - 7.7 K/uL   Lymphocytes Relative 14 %   Lymphs Abs 1.7 0.7 - 4.0 K/uL   Monocytes Relative 4 %   Monocytes Absolute 0.5 0.1 - 1.0 K/uL   Eosinophils Relative 0 %   Eosinophils Absolute 0.0 0.0 - 0.7 K/uL   Basophils Relative 0 %   Basophils Absolute 0.0 0.0 - 0.1 K/uL  Basic metabolic panel     Status: Abnormal   Collection Time: 07/16/15  5:27 PM  Result Value Ref Range   Sodium 139 135 - 145 mmol/L   Potassium 4.0 3.5 - 5.1 mmol/L   Chloride 103 101 - 111  mmol/L   CO2 27 22 - 32 mmol/L   Glucose, Bld 57 (L) 65 - 99 mg/dL   BUN 12 6 - 20 mg/dL   Creatinine, Ser 0.64 0.44 - 1.00 mg/dL   Calcium 9.0 8.9 - 10.3 mg/dL   GFR calc non Af Amer >60 >60 mL/min   GFR calc Af Amer >60 >60 mL/min    Comment: (NOTE) The eGFR has been calculated using the CKD EPI equation. This calculation has not been validated in all clinical situations. eGFR's persistently <60 mL/min signify possible Chronic Kidney Disease.    Anion gap 9 5 - 15  Glucose, capillary     Status: Abnormal   Collection Time: 07/16/15  7:11 PM  Result Value Ref Range   Glucose-Capillary 38 (LL) 65 - 99 mg/dL  Glucose, capillary     Status: Abnormal   Collection Time: 07/16/15  7:39 PM  Result Value Ref Range   Glucose-Capillary 46 (L) 65 - 99 mg/dL  Comprehensive metabolic panel     Status: Abnormal   Collection Time: 07/16/15  7:50 PM  Result Value Ref Range   Sodium 139 135 - 145 mmol/L   Potassium 4.1 3.5 - 5.1 mmol/L   Chloride 104 101 - 111 mmol/L   CO2 27 22 - 32 mmol/L   Glucose, Bld 71 65 - 99 mg/dL   BUN 12 6 - 20 mg/dL   Creatinine, Ser 0.67 0.44 - 1.00 mg/dL   Calcium 9.1 8.9 - 10.3 mg/dL   Total Protein 7.1 6.5 - 8.1 g/dL   Albumin 3.3 (L) 3.5 - 5.0 g/dL   AST 38 15 - 41 U/L   ALT 25 14 - 54 U/L   Alkaline Phosphatase 97 38 - 126 U/L   Total Bilirubin 0.4 0.3 - 1.2 mg/dL   GFR calc non Af Amer >60 >60 mL/min   GFR calc Af Amer >60 >60 mL/min    Comment: (NOTE) The  eGFR has been calculated using the CKD EPI equation. This calculation has not been validated in all clinical situations. eGFR's persistently <60 mL/min signify possible Chronic Kidney Disease.    Anion gap 8 5 - 15  Magnesium     Status: None   Collection Time: 07/16/15  7:50 PM  Result Value Ref Range   Magnesium 1.8 1.7 - 2.4 mg/dL  Phosphorus     Status: None   Collection Time: 07/16/15  7:50 PM  Result Value Ref Range   Phosphorus 4.6 2.5 - 4.6 mg/dL  CBC WITH DIFFERENTIAL     Status: Abnormal   Collection Time: 07/16/15  7:50 PM  Result Value Ref Range   WBC 11.8 (H) 4.0 - 10.5 K/uL   RBC 4.63 3.87 - 5.11 MIL/uL   Hemoglobin 12.7 12.0 - 15.0 g/dL   HCT 39.6 36.0 - 46.0 %   MCV 85.5 78.0 - 100.0 fL   MCH 27.4 26.0 - 34.0 pg   MCHC 32.1 30.0 - 36.0 g/dL   RDW 14.3 11.5 - 15.5 %   Platelets 373 150 - 400 K/uL   Neutrophils Relative % 82 %   Neutro Abs 9.6 (H) 1.7 - 7.7 K/uL   Lymphocytes Relative 14 %   Lymphs Abs 1.7 0.7 - 4.0 K/uL   Monocytes Relative 4 %   Monocytes Absolute 0.5 0.1 - 1.0 K/uL   Eosinophils Relative 0 %   Eosinophils Absolute 0.0 0.0 - 0.7 K/uL   Basophils Relative 0 %   Basophils Absolute 0.0 0.0 -  0.1 K/uL  APTT     Status: None   Collection Time: 07/16/15  7:50 PM  Result Value Ref Range   aPTT 28 24 - 37 seconds  Protime-INR     Status: None   Collection Time: 07/16/15  7:50 PM  Result Value Ref Range   Prothrombin Time 13.9 11.6 - 15.2 seconds   INR 1.05 0.00 - 1.49  TSH     Status: Abnormal   Collection Time: 07/16/15  7:50 PM  Result Value Ref Range   TSH 6.272 (H) 0.350 - 4.500 uIU/mL  Hemoglobin A1c     Status: Abnormal   Collection Time: 07/16/15  7:50 PM  Result Value Ref Range   Hgb A1c MFr Bld 7.0 (H) 4.8 - 5.6 %    Comment: (NOTE)         Pre-diabetes: 5.7 - 6.4         Diabetes: >6.4         Glycemic control for adults with diabetes: <7.0    Mean Plasma Glucose 154 mg/dL    Comment: (NOTE) Performed At: [facility] [address] [place], [place] [id number] Lindon Romp MD [id number]   Glucose, capillary     Status: None   Collection Time: 07/16/15  8:32 PM  Result Value Ref Range   Glucose-Capillary 67 65 - 99 mg/dL  Glucose, capillary     Status: None   Collection Time: 07/16/15  9:57 PM  Result Value Ref Range   Glucose-Capillary 78 65 - 99 mg/dL  Glucose, capillary     Status: Abnormal   Collection Time: 07/17/15  3:05 AM  Result Value Ref Range   Glucose-Capillary 106 (H) 65 - 99 mg/dL  Comprehensive metabolic panel     Status: Abnormal   Collection Time: 07/17/15  4:40 AM  Result Value Ref Range   Sodium 138 135 - 145 mmol/L   Potassium 4.7 3.5 - 5.1 mmol/L   Chloride 102 101 - 111 mmol/L   CO2 28 22 - 32 mmol/L   Glucose, Bld 138 (H) 65 - 99 mg/dL   BUN 12 6 - 20 mg/dL   Creatinine, Ser 0.69 0.44 - 1.00 mg/dL   Calcium 8.8 (L) 8.9 - 10.3 mg/dL   Total Protein 6.9 6.5 - 8.1 g/dL   Albumin 3.3 (L) 3.5 - 5.0 g/dL   AST 37 15 - 41 U/L   ALT 33 14 - 54 U/L   Alkaline Phosphatase 104 38 - 126 U/L   Total Bilirubin 0.7 0.3 - 1.2 mg/dL   GFR calc non Af Amer >60 >60 mL/min   GFR calc Af Amer >60 >60 mL/min    Comment: (NOTE) The eGFR has been calculated using the CKD EPI equation. This calculation has not been validated in all clinical situations. eGFR's persistently <60 mL/min signify possible Chronic Kidney Disease.    Anion gap 8 5 - 15  CBC     Status: None   Collection Time: 07/17/15  4:40 AM  Result Value Ref Range   WBC 10.2 4.0 - 10.5 K/uL   RBC 4.54 3.87 - 5.11 MIL/uL   Hemoglobin 12.2 12.0 - 15.0 g/dL   HCT 39.3 36.0 - 46.0 %   MCV 86.6 78.0 - 100.0 fL   MCH 26.9 26.0 - 34.0 pg   MCHC 31.0 30.0 - 36.0 g/dL   RDW 14.6 11.5 - 15.5 %   Platelets 364 150 - 400 K/uL  Glucose, capillary  Status: Abnormal   Collection Time: 07/17/15  7:54 AM  Result Value Ref Range   Glucose-Capillary 125 (H) 65 - 99 mg/dL  Glucose, capillary     Status: Abnormal    Collection Time: 07/17/15 12:04 PM  Result Value Ref Range   Glucose-Capillary 57 (L) 65 - 99 mg/dL  Glucose, capillary     Status: Abnormal   Collection Time: 07/17/15 12:25 PM  Result Value Ref Range   Glucose-Capillary 47 (L) 65 - 99 mg/dL  Glucose, capillary     Status: None   Collection Time: 07/17/15 12:57 PM  Result Value Ref Range   Glucose-Capillary 88 65 - 99 mg/dL  Glucose, capillary     Status: None   Collection Time: 07/17/15  5:54 PM  Result Value Ref Range   Glucose-Capillary 86 65 - 99 mg/dL  Glucose, capillary     Status: Abnormal   Collection Time: 07/17/15  8:43 PM  Result Value Ref Range   Glucose-Capillary 119 (H) 65 - 99 mg/dL  Glucose, capillary     Status: Abnormal   Collection Time: 07/17/15 11:46 PM  Result Value Ref Range   Glucose-Capillary 149 (H) 65 - 99 mg/dL  Glucose, capillary     Status: Abnormal   Collection Time: 07/18/15  4:21 AM  Result Value Ref Range   Glucose-Capillary 127 (H) 65 - 99 mg/dL  Glucose, capillary     Status: Abnormal   Collection Time: 07/18/15  7:16 AM  Result Value Ref Range   Glucose-Capillary 155 (H) 65 - 99 mg/dL   Comment 1 Notify RN    Comment 2 Document in Chart   Glucose, capillary     Status: Abnormal   Collection Time: 07/18/15 11:35 AM  Result Value Ref Range   Glucose-Capillary 131 (H) 65 - 99 mg/dL   Comment 1 Notify RN    Comment 2 Document in Chart     Dg Knee Complete 4 Views Right  07/16/2015  CLINICAL DATA:  Right knee pain after tripping over her foot and falling today. Status post partial right knee replacement this year. EXAM: RIGHT KNEE - COMPLETE 4+ VIEW COMPARISON:  None. FINDINGS: Comminuted fracture involving the medial and lateral tibial plateaus with extension into the central joint space. There is also a medial knee prosthesis. Also noted is a nondisplaced transverse fracture of the fibular head. Probable small effusion. Minimal posterior patellar spur formation. IMPRESSION: 1.  Essentially nondisplaced comminuted fracture of the medial and lateral tibial plateaus with intra-articular extension centrally. 2. Nondisplaced fibular head fracture. 3. Minimal patellofemoral degenerative change. 4. Medial knee prosthesis. Electronically Signed   By: Claudie Revering M.D.   On: 07/16/2015 15:26    Review of Systems  Constitutional: Negative for fever and chills.  Respiratory: Negative for shortness of breath and wheezing.   Cardiovascular: Negative for chest pain and palpitations.  Gastrointestinal: Negative for nausea and vomiting.  Genitourinary: Negative for dysuria, urgency and hematuria.  Musculoskeletal:       R knee pain  L ankle pain   Neurological: Negative for headaches.        Baseline Lower extremity peripheral neuropathy   Blood pressure 90/50, pulse 105, temperature 98.9 F (37.2 C), temperature source Oral, resp. rate 17, height _0  (1.575 m), SpO2 90 %. Physical Exam  Nursing note and vitals reviewed. Constitutional: She is cooperative.  Morbidly obese  Cardiovascular: Regular rhythm, S1 normal and S2 normal.  Bradycardia present.   Respiratory:  Clear anterior fields  GI:  Obese, nontender  Musculoskeletal:  Right lower extremity Inspection:   Healed surgical scar knee   Leg is dysmorphic   Hip and ankle unremarkable   Bony eval:    Knee is tender to palpation particularly medially    Hip and ankle nontender Soft tissue:   No significant swelling around the right knee. Skin is freely mobile and wrinkles with gentle compression. No significant bruising or wounds noted. ROM:   Range of motion of right knee not assessed   Full passive ankle range of motion is noted Sensation:    Baseline peripheral neuropathy. Sensory functions are unchanged from baseline Motor:    EHL, FHL, anterior tibialis, posterior tibialis, peroneals and gastrocsoleus complex motor function intact Vascular:       Extremity is warm      + DP pulse      Compartments  soft and nontender, no pain with passive stretch  Left lower extremity    All surgical wound lateral left ankle. Appears this may have had some healing issues but is well sealed now.     Range of motion left ankle preserved and pain free     tender with palpation over her lateral left ankle    Motor and Sensory functions at baseline   Bilateral upper extremities UEx shoulder, elbow, wrist, digits- no skin wounds, nontender, no instability, no blocks to motion  Sens  Ax/R/M/U intact  Mot   Ax/ R/ PIN/ M/ AIN/ U intact  Rad 2+   Neurological: She is alert.    Assessment/Plan:  39 year old morbidly obese white female with numerous medical comorbidities status post ground level fall with right periprosthetic tibial plateau fracture  [facility] this evening Plan for ORIF right tibial plateau tomorrow morning 0830 Nonweightbearing 8 weeks postop Unrestricted knee range of motion postop We'll check x-rays of left ankle Normal saline bolus for low pressures, increase IV fluids to 100 cc/hr Lisinopril is on hold Add Norco for additional pain control Patient is at risk for infection given her hemoglobin A1c however her CBGs appear to be decent  Patient will be transferred to the internal medicine service at [facility] with orthopedic trauma service consultation.  Npo after midnight Clindamycin for preoperative antibiotic prophylaxis due to a penicillin allergy   Jari Pigg, PA-C [facility] [phone] (P) 07/18/2015, 2:57 PM

## 2015-07-18 NOTE — Progress Notes (Signed)
Patient ID: [name], female   DOB: 1976-02-19, 39 y.o.   MRN: [id number] [facility] PROGRESS NOTE  [name] [id number] DOB: July 25, 1976 DOA: 07/16/2015 PCP: Vladimir Creeks, MD  Brief narrative:    39 year old female with past medical history of morbid obesity, hypertension, dyslipidemia, hypothyroidism who presented to [facility] ED status post fall while shopping.   In ED, BP was stable. Blood work showed mild leukocytosis of 12.3. X rays demonstrated right tibial plateau fracture. Ortho to see her in consultation.   Anticipated discharge: Appreciate SW assistance with discharge plan.  Assessment/Plan:     Principal Problem:  Fracture, tibial plateau - S/P mechanical fall - Has knee immobilizer - SW assistance with D/C planning appreciated  - Continue pain management efforts  Active Problems:  Major depressive disorder, recurrent episode, mild (HCC) - Continue Effexor - Stable - Not depressed    Hyperlipidemia - Continue gemfibrozil   Cushing disease ([place]) / Morbid obesity ([place]) - Seen by dietician    Hypothyroid - Continue synthroid 50 mcg daily     Uncontrolled diabetes mellitus with diabetic neuropathy, with long-term current use of insulin (HCC) - A1c is still pending as of 07/18/2015. - Continue insulin 25 units BID (reduced to half her dose due to frequent hypoglycemic episodes) - We will continue invokana - We will continue gabapentin 600 mg PO BID for diabetic neuropathy    Leukocytosis - Likely reactive - No obvious source of infection   Essential hypertension - Continue lisinopril 5 mg daily   DVT prophylaxis:  - SCD's bilaterally in hospital   Code Status: Full.  Family Communication:  plan of care discussed with the patient Disposition Plan:   IV access:  Peripheral IV  Procedures and diagnostic studies:    Dg Knee Complete 4 Views Right 07/16/2015 1. Essentially nondisplaced comminuted fracture of the medial and  lateral tibial plateaus with intra-articular extension centrally. 2. Nondisplaced fibular head fracture. 3. Minimal patellofemoral degenerative change. 4. Medial knee prosthesis.  Medical Consultants:  Ortho  Other Consultants:  PT  IAnti-Infectives:   None    Leisa Lenz, MD  [facility] Pager [phone]  Time spent in minutes: 15 minutes  If 7PM-7AM, please contact night-coverage [email or web address] Password [facility] 07/18/2015, 10:57 AM   LOS: 2 days    HPI/Subjective: No acute overnight events. Patient reports pain in the knee. Has knee immobilizer.   Objective: Filed Vitals:   07/17/15 2351 07/18/15 0417 07/18/15 1009 07/18/15 1012  BP:  108/50 78/35 90/50   Pulse:  104 105   Temp:  99.1 F (37.3 C) 98.9 F (37.2 C)   TempSrc:  Oral Oral   Resp:  18 17   Height:      SpO2: 95% 94% 90%     Intake/Output Summary (Last 24 hours) at 07/18/15 1057 Last data filed at 07/18/15 0936  Gross per 24 hour  Intake   1300 ml  Output   1300 ml  Net      0 ml    Exam:   General:  Pt is not in acute distress, morbidly obese  Cardiovascular: RRR, S1/S2 (+)  Respiratory: No wheezing, no crackles, no rhonchi  Abdomen: (+) BS, non tender   Extremities: No leg swelling, pulses palpable  Neuro: Nonfocal  Data Reviewed: Basic Metabolic Panel:  Recent Labs Lab 07/16/15 1727 07/16/15 1950 07/17/15 0440  NA 139 139 138  K 4.0 4.1 4.7  CL 103 104 102  CO2 27 27 28   GLUCOSE  57* 71 138*  BUN 12 12 12   CREATININE 0.64 0.67 0.69  CALCIUM 9.0 9.1 8.8*  MG  --  1.8  --   PHOS  --  4.6  --    Liver Function Tests:  Recent Labs Lab 07/16/15 1950 07/17/15 0440  AST 38 37  ALT 25 33  ALKPHOS 97 104  BILITOT 0.4 0.7  PROT 7.1 6.9  ALBUMIN 3.3* 3.3*   No results for input(s): LIPASE, AMYLASE in the last 168 hours. No results for input(s): AMMONIA in the last 168 hours. CBC:  Recent Labs Lab 07/16/15 1727 07/16/15 1950 07/17/15 0440  WBC 12.3* 11.8*  10.2  NEUTROABS 10.0* 9.6*  --   HGB 12.9 12.7 12.2  HCT 40.0 39.6 39.3  MCV 85.3 85.5 86.6  PLT 360 373 364   Cardiac Enzymes: No results for input(s): CKTOTAL, CKMB, CKMBINDEX, TROPONINI in the last 168 hours. BNP: Invalid input(s): POCBNP CBG:  Recent Labs Lab 07/17/15 1754 07/17/15 2043 07/17/15 2346 07/18/15 0421 07/18/15 0716  GLUCAP 86 119* 149* 127* 155*    No results found for this or any previous visit (from the past 240 hour(s)).   Scheduled Meds: . canagliflozin  300 mg Oral QAC breakfast  . cholestyramine  4 g Oral Q1200  . fluticasone  1 spray Each Nare Daily  . gabapentin  600 mg Oral BID  . gemfibrozil  600 mg Oral BID AC  . hydrOXYzine  100 mg Oral QHS  . insulin aspart  0-20 Units Subcutaneous TID WC  . insulin aspart protamine- aspart  25 Units Subcutaneous BID WC  . levothyroxine  50 mcg Oral QAC breakfast  . lisinopril  5 mg Oral Daily  . loratadine  10 mg Oral Daily  . venlafaxine XR  37.5 mg Oral QHS  . venlafaxine XR  75 mg Oral Q breakfast   Continuous Infusions: . sodium chloride 1,000 mL (07/17/15 0323)

## 2015-07-18 NOTE — Progress Notes (Signed)
Pt's spouse reported to CSW that Ortho will see pt today and surgery may be required. NSG has confirmed this info. CSW will meet again with pt / spouse following Ortho recommendations.  Werner Lean LCSW [phone]

## 2015-07-18 NOTE — Progress Notes (Signed)
PT Cancellation Note  Patient Details Name: SOLANGEL MCMANAWAY MRN: [id number] DOB: [birth year]   Cancelled Treatment:    Reason Eval/Treat Not Completed: Patient not medically ready (awaiting surgery for tibial plateau fx; will see per ortho recs)   Kenyon Ana 07/18/2015, 1:21 PM

## 2015-07-18 NOTE — Progress Notes (Signed)
[facility] notified pt concerned that Ortho physician has not been in to see her yet [name]

## 2015-07-18 NOTE — Clinical Social Work Note (Signed)
Clinical Social Work Assessment  Patient Details  Name: [name] MRN: [id number] Date of Birth: [birth year]  Date of referral:  07/18/15               Reason for consult:  Facility Placement, Discharge Planning                Permission sought to share information with:  Chartered certified accountant granted to share information::  Yes, Verbal Permission Granted  Name::        Agency::     Relationship::     Contact Information:     Housing/Transportation Living arrangements for the past 2 months:  Single Family Home Source of Information:  Patient, Spouse Patient Interpreter Needed:  None Criminal Activity/Legal Involvement Pertinent to Current Situation/Hospitalization:  No - Comment as needed Significant Relationships:  Parents, Spouse Lives with:  Spouse, Minor Children Do you feel safe going back to the place where you live?  No (Placement required) Need for family participation in patient care:  Yes (Comment)  Care giving concerns:  Pt's care cannot be managed at home following hospital d/c.   Social Worker assessment / plan:  Pt hospitalized on 07/16/15 with a right tibial plateau fx. Pt had fallen while out shopping.  PN reviewed and nsg consulted. Surgery has not been recommended. MD has ordered PT and SNF placement. CSW has met with pt / spouse to assist with d/c planning. Pt lives at home with spouse, 61 yr old autistic  son and 74 yr old daughter. Pt / spouse are in agreement with CSW initiating SNF search once PT eval / recommendations are available. CSW will continue to follow to assist  with d/c planning needs.  Employment status:  Unemployed Nurse, adult, Medicaid In [place] PT Recommendations:  Not assessed at this time Information / Referral to community resources:  [place]  Patient/Family's Response to care:  Pt would prefer to return home at d/c. Spouse is unable to provide pt with the care she will  require.  Patient/Family's Understanding of and Emotional Response to Diagnosis, Current Treatment, and Prognosis:  Pt / spouse has been frustrated waiting for information regarding treatment plan. Hospitalist met with them this am and explained surgery will not be required. They are aware PT eval is pending and CSW will return following therapy to continue assisting with d/c planning.  Emotional Assessment Appearance:  Appears stated age Attitude/Demeanor/Rapport:  Other (Frustrated) Affect (typically observed):  Irritable Orientation:  Oriented to Self, Oriented to Place, Oriented to  Time, Oriented to Situation Alcohol / Substance use:  Not Applicable Psych involvement (Current and /or in the community):  No (Comment)  Discharge Needs  Concerns to be addressed:  Discharge Planning Concerns Readmission within the last 30 days:  No Current discharge risk:  None Barriers to Discharge:  No Barriers Identified   [name], [place] 07/18/2015, 10:15 AM

## 2015-07-19 ENCOUNTER — Inpatient Hospital Stay (HOSPITAL_COMMUNITY): Payer: 59

## 2015-07-19 ENCOUNTER — Encounter (HOSPITAL_COMMUNITY)
Admission: EM | Disposition: A | Discharge status: Skilled Nursing Facility | Payer: Self-pay | Source: Home / Self Care | Attending: Internal Medicine

## 2015-07-19 ENCOUNTER — Inpatient Hospital Stay (HOSPITAL_COMMUNITY): Admission: RE | Admit: 2015-07-19 | Payer: 59 | Source: Ambulatory Visit | Admitting: Orthopedic Surgery

## 2015-07-19 ENCOUNTER — Inpatient Hospital Stay (HOSPITAL_COMMUNITY): Payer: 59 | Admitting: Certified Registered Nurse Anesthetist

## 2015-07-19 ENCOUNTER — Encounter (HOSPITAL_COMMUNITY): Payer: Self-pay | Admitting: Orthopedic Surgery

## 2015-07-19 DIAGNOSIS — M810 Age-related osteoporosis without current pathological fracture: Secondary | ICD-10-CM

## 2015-07-19 DIAGNOSIS — Z794 Long term (current) use of insulin: Secondary | ICD-10-CM

## 2015-07-19 DIAGNOSIS — E1140 Type 2 diabetes mellitus with diabetic neuropathy, unspecified: Secondary | ICD-10-CM

## 2015-07-19 DIAGNOSIS — M8080XA Other osteoporosis with current pathological fracture, unspecified site, initial encounter for fracture: Secondary | ICD-10-CM | POA: Diagnosis present

## 2015-07-19 DIAGNOSIS — E1165 Type 2 diabetes mellitus with hyperglycemia: Secondary | ICD-10-CM

## 2015-07-19 DIAGNOSIS — I10 Essential (primary) hypertension: Secondary | ICD-10-CM

## 2015-07-19 DIAGNOSIS — E559 Vitamin D deficiency, unspecified: Secondary | ICD-10-CM

## 2015-07-19 DIAGNOSIS — D72829 Elevated white blood cell count, unspecified: Secondary | ICD-10-CM

## 2015-07-19 DIAGNOSIS — S82141S Displaced bicondylar fracture of right tibia, sequela: Secondary | ICD-10-CM

## 2015-07-19 HISTORY — DX: Vitamin D deficiency, unspecified: E55.9

## 2015-07-19 HISTORY — PX: ORIF TIBIA PLATEAU: SHX2132

## 2015-07-19 HISTORY — DX: Age-related osteoporosis without current pathological fracture: M81.0

## 2015-07-19 LAB — SURGICAL PCR SCREEN
MRSA, PCR: NEGATIVE
MRSA, PCR: NEGATIVE
Staphylococcus aureus: POSITIVE — AB
Staphylococcus aureus: POSITIVE — AB

## 2015-07-19 LAB — GLUCOSE, CAPILLARY
GLUCOSE-CAPILLARY: 105 mg/dL — AB (ref 65–99)
GLUCOSE-CAPILLARY: 118 mg/dL — AB (ref 65–99)
GLUCOSE-CAPILLARY: 137 mg/dL — AB (ref 65–99)
GLUCOSE-CAPILLARY: 167 mg/dL — AB (ref 65–99)
GLUCOSE-CAPILLARY: 146 mg/dL — AB (ref 65–99)
GLUCOSE-CAPILLARY: 139 mg/dL — AB (ref 65–99)

## 2015-07-19 LAB — VITAMIN D 25 HYDROXY (VIT D DEFICIENCY, FRACTURES): Vit D, 25-Hydroxy: 11.6 ng/mL — ABNORMAL LOW (ref 30.0–100.0)

## 2015-07-19 SURGERY — OPEN REDUCTION INTERNAL FIXATION (ORIF) TIBIAL PLATEAU
Anesthesia: General | Laterality: Right

## 2015-07-19 MED ORDER — PROPOFOL 10 MG/ML IV BOLUS
INTRAVENOUS | Status: DC | PRN
  Administered 2015-07-19: 200 mg via INTRAVENOUS

## 2015-07-19 MED ORDER — OXYCODONE HCL 5 MG PO TABS: 5.0000 mg | ORAL_TABLET | Freq: Once | ORAL | Status: DC | PRN

## 2015-07-19 MED ORDER — ONDANSETRON HCL 4 MG/2ML IJ SOLN
INTRAMUSCULAR | Status: DC | PRN
  Administered 2015-07-19 (×2): 4 mg via INTRAVENOUS

## 2015-07-19 MED ORDER — LACTATED RINGERS IV SOLN
INTRAVENOUS | Status: DC
  Administered 2015-07-19: 08:00:00 via INTRAVENOUS

## 2015-07-19 MED ORDER — ONDANSETRON HCL 4 MG/2ML IJ SOLN: 4.0000 mg | Freq: Four times a day (QID) | INTRAMUSCULAR | Status: DC | PRN

## 2015-07-19 MED ORDER — OXYCODONE HCL 5 MG/5ML PO SOLN: 5.0000 mg | Freq: Once | ORAL | Status: DC | PRN

## 2015-07-19 MED ORDER — 0.9 % SODIUM CHLORIDE (POUR BTL) OPTIME
TOPICAL | Status: DC | PRN
  Administered 2015-07-19: 1000 mL

## 2015-07-19 MED ORDER — HYDROCODONE-ACETAMINOPHEN 7.5-325 MG PO TABS
ORAL_TABLET | ORAL | Status: AC
  Filled 2015-07-19: qty 1

## 2015-07-19 MED ORDER — LIDOCAINE HCL (CARDIAC) 20 MG/ML IV SOLN
INTRAVENOUS | Status: DC | PRN
  Administered 2015-07-19: 80 mg via INTRAVENOUS

## 2015-07-19 MED ORDER — HYDROMORPHONE HCL 1 MG/ML IJ SOLN
0.2500 mg | INTRAMUSCULAR | Status: DC | PRN
  Administered 2015-07-19: 0.75 mg via INTRAVENOUS

## 2015-07-19 MED ORDER — METHOCARBAMOL 500 MG PO TABS: 1000.0000 mg | ORAL_TABLET | Freq: Four times a day (QID) | ORAL | Status: DC | PRN

## 2015-07-19 MED ORDER — HYDROMORPHONE HCL 1 MG/ML IJ SOLN
INTRAMUSCULAR | Status: AC
Start: 2015-07-19 — End: 2015-07-20
  Filled 2015-07-19: qty 1

## 2015-07-19 MED ORDER — FENTANYL CITRATE (PF) 100 MCG/2ML IJ SOLN
INTRAMUSCULAR | Status: DC | PRN
  Administered 2015-07-19 (×7): 25 ug via INTRAVENOUS

## 2015-07-19 MED ORDER — DEXAMETHASONE SODIUM PHOSPHATE 4 MG/ML IJ SOLN
INTRAMUSCULAR | Status: DC | PRN
  Administered 2015-07-19: 4 mg via INTRAVENOUS

## 2015-07-19 MED ORDER — CLINDAMYCIN PHOSPHATE 600 MG/50ML IV SOLN
600.0000 mg | Freq: Two times a day (BID) | INTRAVENOUS | Status: AC
  Administered 2015-07-19 – 2015-07-20 (×2): 600 mg via INTRAVENOUS
  Filled 2015-07-19 (×2): qty 50

## 2015-07-19 MED ORDER — LACTATED RINGERS IV SOLN
INTRAVENOUS | Status: DC | PRN
  Administered 2015-07-19 (×2): via INTRAVENOUS

## 2015-07-19 SURGICAL SUPPLY — 88 items
BANDAGE ELASTIC 4 VELCRO ST LF (GAUZE/BANDAGES/DRESSINGS) ×3 IMPLANT
BANDAGE ELASTIC 6 VELCRO ST LF (GAUZE/BANDAGES/DRESSINGS) ×3 IMPLANT
BANDAGE ESMARK 6X9 LF (GAUZE/BANDAGES/DRESSINGS) ×1 IMPLANT
BLADE SURG 10 STRL SS (BLADE) ×3 IMPLANT
BLADE SURG 15 STRL LF DISP TIS (BLADE) ×1 IMPLANT
BLADE SURG 15 STRL SS (BLADE) ×3
BLADE SURG ROTATE 9660 (MISCELLANEOUS) IMPLANT
BNDG CMPR 9X6 STRL LF SNTH (GAUZE/BANDAGES/DRESSINGS) ×1
BNDG COHESIVE 4X5 TAN STRL (GAUZE/BANDAGES/DRESSINGS) ×3 IMPLANT
BNDG COHESIVE 6X5 TAN STRL LF (GAUZE/BANDAGES/DRESSINGS) ×2 IMPLANT
BNDG ESMARK 6X9 LF (GAUZE/BANDAGES/DRESSINGS) ×3
BNDG GAUZE ELAST 4 BULKY (GAUZE/BANDAGES/DRESSINGS) ×3 IMPLANT
BRUSH SCRUB DISP (MISCELLANEOUS) ×6 IMPLANT
CANISTER SUCT 3000ML PPV (MISCELLANEOUS) ×3 IMPLANT
COVER MAYO STAND STRL (DRAPES) ×3 IMPLANT
COVER SURGICAL LIGHT HANDLE (MISCELLANEOUS) ×3 IMPLANT
DRAPE C-ARM 42X72 X-RAY (DRAPES) ×3 IMPLANT
DRAPE C-ARMOR (DRAPES) ×3 IMPLANT
DRAPE EXTREMITY T 121X128X90 (DRAPE) IMPLANT
DRAPE INCISE IOBAN 66X45 STRL (DRAPES) ×5 IMPLANT
DRAPE ORTHO SPLIT 77X108 STRL (DRAPES)
DRAPE SURG ORHT 6 SPLT 77X108 (DRAPES) IMPLANT
DRAPE U-SHAPE 47X51 STRL (DRAPES) ×3 IMPLANT
DRSG ADAPTIC 3X8 NADH LF (GAUZE/BANDAGES/DRESSINGS) ×3 IMPLANT
DRSG AQUACEL AG ADV 3.5X10 (GAUZE/BANDAGES/DRESSINGS) ×2 IMPLANT
DRSG PAD ABDOMINAL 8X10 ST (GAUZE/BANDAGES/DRESSINGS) ×12 IMPLANT
ELECT REM PT RETURN 9FT ADLT (ELECTROSURGICAL) ×3
ELECTRODE REM PT RTRN 9FT ADLT (ELECTROSURGICAL) ×1 IMPLANT
EVACUATOR 1/8 PVC DRAIN (DRAIN) IMPLANT
EVACUATOR 3/16  PVC DRAIN (DRAIN)
EVACUATOR 3/16 PVC DRAIN (DRAIN) IMPLANT
GAUZE SPONGE 4X4 12PLY STRL (GAUZE/BANDAGES/DRESSINGS) ×3 IMPLANT
GLOVE BIO SURGEON STRL SZ 6.5 (GLOVE) ×3 IMPLANT
GLOVE BIO SURGEON STRL SZ7.5 (GLOVE) ×3 IMPLANT
GLOVE BIO SURGEON STRL SZ8 (GLOVE) ×3 IMPLANT
GLOVE BIO SURGEONS STRL SZ 6.5 (GLOVE) ×3
GLOVE BIOGEL PI IND STRL 7.5 (GLOVE) ×1 IMPLANT
GLOVE BIOGEL PI IND STRL 8 (GLOVE) ×1 IMPLANT
GLOVE BIOGEL PI INDICATOR 7.5 (GLOVE) ×4
GLOVE BIOGEL PI INDICATOR 8 (GLOVE) ×2
GOWN STRL REUS W/ TWL LRG LVL3 (GOWN DISPOSABLE) ×2 IMPLANT
GOWN STRL REUS W/ TWL XL LVL3 (GOWN DISPOSABLE) ×1 IMPLANT
GOWN STRL REUS W/TWL LRG LVL3 (GOWN DISPOSABLE) ×6
GOWN STRL REUS W/TWL XL LVL3 (GOWN DISPOSABLE) ×3
IMMOBILIZER KNEE 22 UNIV (SOFTGOODS) ×3 IMPLANT
K-WIRE TROCAR DB TIP 2X150 (Orthopedic Implant) ×6 IMPLANT
KIT BASIN OR (CUSTOM PROCEDURE TRAY) ×3 IMPLANT
KIT ROOM TURNOVER OR (KITS) ×3 IMPLANT
KWIRE TROCAR DB TIP 2X150 (Orthopedic Implant) IMPLANT
NDL SUT 6 .5 CRC .975X.05 MAYO (NEEDLE) IMPLANT
NEEDLE 22X1 1/2 (OR ONLY) (NEEDLE) IMPLANT
NEEDLE MAYO TAPER (NEEDLE)
NS IRRIG 1000ML POUR BTL (IV SOLUTION) ×3 IMPLANT
PACK ORTHO EXTREMITY (CUSTOM PROCEDURE TRAY) ×3 IMPLANT
PAD ARMBOARD 7.5X6 YLW CONV (MISCELLANEOUS) ×6 IMPLANT
PAD CAST 4YDX4 CTTN HI CHSV (CAST SUPPLIES) ×1 IMPLANT
PADDING CAST COTTON 4X4 STRL (CAST SUPPLIES) ×3
PADDING CAST COTTON 6X4 STRL (CAST SUPPLIES) ×3 IMPLANT
PLATE 4H RT 3.5MM MED PROX TIB (Plate) ×2 IMPLANT
SCREW CORTEX 3.5 28MM (Screw) ×2 IMPLANT
SCREW CORTEX 3.5 32MM (Screw) ×2 IMPLANT
SCREW CORTEX 3.5 50MM (Screw) ×2 IMPLANT
SCREW CORTEX 3.5 65MM (Screw) ×2 IMPLANT
SCREW LOCK CORT ST 3.5X28 (Screw) IMPLANT
SCREW LOCK CORT ST 3.5X32 (Screw) IMPLANT
SCREW LOCK ST T25 SD 5.0X46 (Screw) ×2 IMPLANT
SCREW SELF TAP 65MM (Screw) ×4 IMPLANT
SPONGE LAP 18X18 X RAY DECT (DISPOSABLE) ×3 IMPLANT
STAPLER VISISTAT 35W (STAPLE) ×5 IMPLANT
STOCKINETTE IMPERVIOUS LG (DRAPES) ×3 IMPLANT
SUCTION FRAZIER TIP 10 FR DISP (SUCTIONS) ×3 IMPLANT
SUT ETHILON 3 0 PS 1 (SUTURE) ×4 IMPLANT
SUT PROLENE 0 CT 2 (SUTURE) ×6 IMPLANT
SUT VIC AB 0 CT1 27 (SUTURE) ×6
SUT VIC AB 0 CT1 27XBRD ANBCTR (SUTURE) ×1 IMPLANT
SUT VIC AB 1 CT1 27 (SUTURE) ×6
SUT VIC AB 1 CT1 27XBRD ANBCTR (SUTURE) ×1 IMPLANT
SUT VIC AB 2-0 CT1 27 (SUTURE) ×6
SUT VIC AB 2-0 CT1 TAPERPNT 27 (SUTURE) ×2 IMPLANT
SUT VIC AB 2-0 CT2 18 VCP726D (SUTURE) ×2 IMPLANT
SYR 20ML ECCENTRIC (SYRINGE) IMPLANT
TOWEL OR 17X24 6PK STRL BLUE (TOWEL DISPOSABLE) ×3 IMPLANT
TOWEL OR 17X26 10 PK STRL BLUE (TOWEL DISPOSABLE) ×6 IMPLANT
TRAY FOLEY CATH 16FRSI W/METER (SET/KITS/TRAYS/PACK) IMPLANT
TUBE CONNECTING 12'X1/4 (SUCTIONS) ×1
TUBE CONNECTING 12X1/4 (SUCTIONS) ×2 IMPLANT
WATER STERILE IRR 1000ML POUR (IV SOLUTION) ×6 IMPLANT
YANKAUER SUCT BULB TIP NO VENT (SUCTIONS) ×3 IMPLANT

## 2015-07-19 NOTE — Progress Notes (Signed)
PT Cancellation Note  Patient Details Name: [name] MRN: [id number] DOB: 11/30/1975   Cancelled Treatment:    Reason Eval/Treat Not Completed: Patient at procedure or test/unavailable;Fatigue/lethargy limiting ability to participate.  Pt is OR in am and asleep on pain med on 2nd attempt.  Will see 11/9 as able. 07/19/2015  Donnella Sham, [place] [phone]  (pager)   Bertina Guthridge, Tessie Fass 07/19/2015, 5:02 PM

## 2015-07-19 NOTE — Anesthesia Procedure Notes (Signed)
Procedure Name: LMA Insertion Date/Time: 07/19/2015 9:13 AM Performed by: Mariea Clonts Pre-anesthesia Checklist: Patient identified, Timeout performed, Emergency Drugs available, Suction available and Patient being monitored Patient Re-evaluated:Patient Re-evaluated prior to inductionOxygen Delivery Method: Circle system utilized Preoxygenation: Pre-oxygenation with 100% oxygen Intubation Type: IV induction LMA: LMA inserted LMA Size: 4.0 Number of attempts: 1 Placement Confirmation: positive ETCO2 and breath sounds checked- equal and bilateral Tube secured with: Tape Dental Injury: Teeth and Oropharynx as per pre-operative assessment

## 2015-07-19 NOTE — Anesthesia Preprocedure Evaluation (Addendum)
Anesthesia Evaluation  Patient identified by MRN, date of birth, ID band Patient awake    Reviewed: Allergy & Precautions, NPO status , Patient's Chart, lab work & pertinent test results  Airway Mallampati: III   Neck ROM: full    Dental  (+) Teeth Intact, Dental Advisory Given   Pulmonary former smoker,  OSA on CPAP   breath sounds clear to auscultation       Cardiovascular hypertension,  Rhythm:regular Rate:Normal     Neuro/Psych Depression    GI/Hepatic   Endo/Other  diabetes, Well Controlled, Type 2Hypothyroidism Morbid obesity? Cushing dx- pt denies  Renal/GU      Musculoskeletal   Abdominal   Peds  Hematology   Anesthesia Other Findings   Reproductive/Obstetrics                            Anesthesia Physical Anesthesia Plan  ASA: II  Anesthesia Plan: General   Post-op Pain Management:    Induction: Intravenous  Airway Management Planned: LMA  Additional Equipment:   Intra-op Plan:   Post-operative Plan:   Informed Consent: I have reviewed the patients History and Physical, chart, labs and discussed the procedure including the risks, benefits and alternatives for the proposed anesthesia with the patient or authorized representative who has indicated his/her understanding and acceptance.     Plan Discussed with: CRNA, Anesthesiologist and Surgeon  Anesthesia Plan Comments:         Anesthesia Quick Evaluation

## 2015-07-19 NOTE — Transfer of Care (Signed)
Immediate Anesthesia Transfer of Care Note  Patient: [name]  Procedure(s) Performed: Procedure(s): OPEN REDUCTION INTERNAL FIXATION (ORIF) RIGHT TIBIAL PLATEAU (Right)  Patient Location: PACU  Anesthesia Type:General  Level of Consciousness: awake and oriented but drowsy  Airway & Oxygen Therapy: Patient Spontanous Breathing and Patient connected to face mask oxygen  Post-op Assessment: Report given to RN and Post -op Vital signs reviewed and stable  Post vital signs: Reviewed and stable  Last Vitals:  Filed Vitals:   07/19/15 0748  BP: 124/70  Pulse: 102  Temp: 37.1 C  Resp: 16    Complications: No apparent anesthesia complications

## 2015-07-19 NOTE — Progress Notes (Signed)
Patient placed herself on home CPAP machine for the night.

## 2015-07-19 NOTE — Anesthesia Postprocedure Evaluation (Signed)
Anesthesia Post Note  Patient: [name]  Procedure(s) Performed: Procedure(s) (LRB): OPEN REDUCTION INTERNAL FIXATION (ORIF) RIGHT TIBIAL PLATEAU (Right)  Anesthesia type: General  Patient location: PACU  Post pain: Pain level controlled and Adequate analgesia  Post assessment: Post-op Vital signs reviewed, Patient's Cardiovascular Status Stable, Respiratory Function Stable, Patent Airway and Pain level controlled  Last Vitals:  Filed Vitals:   07/19/15 1236  BP: 127/73  Pulse: 108  Temp: 37.1 C  Resp: 20    Post vital signs: Reviewed and stable  Level of consciousness: awake, alert  and oriented  Complications: No apparent anesthesia complications

## 2015-07-19 NOTE — Progress Notes (Signed)
Patient ID: [name], female   DOB: [birth year], 39 y.o.   MRN: [id number] [facility] PROGRESS NOTE  Kaiana [place] Eaker GUR:[id number] DOB: 04/28/1976 DOA: 07/16/2015 PCP: Vladimir Creeks, MD  Brief narrative:    39 year old female with past medical history of morbid obesity, hypertension, dyslipidemia, hypothyroidism who presented to [facility] ED status post fall while shopping.   X rays demonstrated right tibial plateau fracture. Transferred to [facility] by ortho on 11/8.    Assessment/Plan:     Fracture, tibial plateau - S/P mechanical fall - Has knee immobilizer - SW assistance with D/C planning appreciated  - Continue pain management efforts   Major depressive disorder, recurrent episode, mild (HCC) - Continue Effexor - Stable - Not depressed    Hyperlipidemia - Continue gemfibrozil   Cushing disease ([place]) / Morbid obesity ([place]) - Seen by dietician    Hypothyroid - Continue synthroid 50 mcg daily     Uncontrolled diabetes mellitus with diabetic neuropathy, with long-term current use of insulin (HCC) - A1c 7 - Continue insulin 25 units BID (reduced to half her dose due to frequent hypoglycemic episodes)-- would resume once she is eating-- just back from surgery and not hungry - We will continue invokana - We will continue gabapentin 600 mg PO BID for diabetic neuropathy    Leukocytosis - Likely reactive - No obvious source of infection   Essential hypertension - Continue lisinopril 5 mg daily   DVT prophylaxis:  - SCD's bilaterally in hospital   Code Status: Full.  Family Communication:   Disposition Plan:   IV access:  Peripheral IV  Procedures and diagnostic studies:    Dg Knee Complete 4 Views Right 07/16/2015 1. Essentially nondisplaced comminuted fracture of the medial and lateral tibial plateaus with intra-articular extension centrally. 2. Nondisplaced fibular head fracture. 3. Minimal patellofemoral degenerative change. 4. Medial knee  prosthesis.  Medical Consultants:  Ortho  Other Consultants:  PT  IAnti-Infectives:   None    Taray Normoyle, DO  [facility] Pager [phone]  Time spent in minutes: 15 minutes  If 7PM-7AM, please contact night-coverage [email or web address] Password [email or web address] 07/19/2015, 12:06 PM   LOS: 3 days    HPI/Subjective: Just back from surgery  Objective: Filed Vitals:   07/19/15 0748 07/19/15 1139 07/19/15 1145 07/19/15 1150  BP: 124/70 143/85  143/87  Pulse: 102  116 110  Temp: 98.7 F (37.1 C) 97.3 F (36.3 C)    TempSrc:      Resp: 16  17 20   Height:      SpO2: 92%  89% 93%    Intake/Output Summary (Last 24 hours) at 07/19/15 1206 Last data filed at 07/19/15 1141  Gross per 24 hour  Intake   3800 ml  Output   1550 ml  Net   2250 ml    Exam:   General:  Pt is not in acute distress, morbidly obese  Cardiovascular: RRR, S1/S2 (+)  Respiratory: No wheezing, no crackles, no rhonchi  Abdomen: (+) BS, non tender   Extremities: No leg swelling, pulses palpable  Neuro: Nonfocal  Data Reviewed: Basic Metabolic Panel:  Recent Labs Lab 07/16/15 1727 07/16/15 1950 07/17/15 0440 07/18/15 1530  NA 139 139 138 138  K 4.0 4.1 4.7 4.4  CL 103 104 102 104  CO2 27 27 28 28   GLUCOSE 57* 71 138* 149*  BUN 12 12 12 16   CREATININE 0.64 0.67 0.69 0.69  CALCIUM 9.0 9.1 8.8* 9.1  MG  --  1.8  --   --   PHOS  --  4.6  --   --    Liver Function Tests:  Recent Labs Lab 07/16/15 1950 07/17/15 0440 07/18/15 1530  AST 38 37 17  ALT 25 33 25  ALKPHOS 97 104 83  BILITOT 0.4 0.7 0.6  PROT 7.1 6.9 7.2  ALBUMIN 3.3* 3.3* 3.1*   No results for input(s): LIPASE, AMYLASE in the last 168 hours. No results for input(s): AMMONIA in the last 168 hours. CBC:  Recent Labs Lab 07/16/15 1727 07/16/15 1950 07/17/15 0440 07/18/15 1530  WBC 12.3* 11.8* 10.2 8.5  NEUTROABS 10.0* 9.6*  --   --   HGB 12.9 12.7 12.2 11.9*  HCT 40.0 39.6 39.3 37.7  MCV 85.3 85.5 86.6 86.3   PLT 360 373 364 364   Cardiac Enzymes: No results for input(s): CKTOTAL, CKMB, CKMBINDEX, TROPONINI in the last 168 hours. BNP: Invalid input(s): POCBNP CBG:  Recent Labs Lab 07/18/15 2156 07/18/15 2251 07/19/15 0642 07/19/15 0725 07/19/15 1137  GLUCAP 94 90 105* 118* 137*    Recent Results (from the past 240 hour(s))  Surgical pcr screen     Status: Abnormal   Collection Time: 07/18/15 10:17 PM  Result Value Ref Range Status   MRSA, PCR NEGATIVE NEGATIVE Final   Staphylococcus aureus POSITIVE (A) NEGATIVE Final    Comment:        The Xpert SA Assay (FDA approved for NASAL specimens in patients over 76 years of age), is one component of a comprehensive surveillance program.  Test performance has been validated by [facility] for patients greater than or equal to 9 year old. It is not intended to diagnose infection nor to guide or monitor treatment.   Surgical pcr screen     Status: Abnormal   Collection Time: 07/19/15 12:34 AM  Result Value Ref Range Status   MRSA, PCR NEGATIVE NEGATIVE Final   Staphylococcus aureus POSITIVE (A) NEGATIVE Final    Comment:        The Xpert SA Assay (FDA approved for NASAL specimens in patients over 56 years of age), is one component of a comprehensive surveillance program.  Test performance has been validated by [facility] for patients greater than or equal to 52 year old. It is not intended to diagnose infection nor to guide or monitor treatment.      Scheduled Meds: . canagliflozin  300 mg Oral QAC breakfast  . cholestyramine  4 g Oral Q1200  . fluticasone  1 spray Each Nare Daily  . gabapentin  600 mg Oral BID  . gemfibrozil  600 mg Oral BID AC  . hydrOXYzine  100 mg Oral QHS  . insulin aspart  0-20 Units Subcutaneous TID WC  . insulin aspart protamine- aspart  25 Units Subcutaneous BID WC  . levothyroxine  50 mcg Oral QAC breakfast  . lisinopril  5 mg Oral Daily  . loratadine  10 mg Oral Daily  . venlafaxine  XR  37.5 mg Oral QHS  . venlafaxine XR  75 mg Oral Q breakfast   Continuous Infusions: . sodium chloride 100 mL/hr at 07/18/15 1452  . lactated ringers 10 mL/hr at 07/19/15 [facility]

## 2015-07-20 ENCOUNTER — Encounter (HOSPITAL_COMMUNITY): Payer: Self-pay | Admitting: Orthopedic Surgery

## 2015-07-20 LAB — CBC
HEMATOCRIT: 35 % — AB (ref 36.0–46.0)
HEMOGLOBIN: 11.2 g/dL — AB (ref 12.0–15.0)
MCH: 27.3 pg (ref 26.0–34.0)
MCHC: 32 g/dL (ref 30.0–36.0)
MCV: 85.4 fL (ref 78.0–100.0)
Platelets: 328 10*3/uL (ref 150–400)
RBC: 4.1 MIL/uL (ref 3.87–5.11)
RDW: 14.4 % (ref 11.5–15.5)
WBC: 9.6 10*3/uL (ref 4.0–10.5)

## 2015-07-20 LAB — COMPREHENSIVE METABOLIC PANEL
ALBUMIN: 2.8 g/dL — AB (ref 3.5–5.0)
ALT: 23 U/L (ref 14–54)
ANION GAP: 13 (ref 5–15)
AST: 18 U/L (ref 15–41)
Alkaline Phosphatase: 76 U/L (ref 38–126)
BUN: 13 mg/dL (ref 6–20)
CHLORIDE: 103 mmol/L (ref 101–111)
CO2: 20 mmol/L — AB (ref 22–32)
CREATININE: 0.61 mg/dL (ref 0.44–1.00)
Calcium: 8.8 mg/dL — ABNORMAL LOW (ref 8.9–10.3)
GFR calc non Af Amer: >60 mL/min (ref >60)
GLUCOSE: 138 mg/dL — AB (ref 65–99)
Potassium: 4.8 mmol/L (ref 3.5–5.1)
SODIUM: 136 mmol/L (ref 135–145)
Total Bilirubin: 0.6 mg/dL (ref 0.3–1.2)
Total Protein: 6.5 g/dL (ref 6.5–8.1)

## 2015-07-20 LAB — GLUCOSE, CAPILLARY
GLUCOSE-CAPILLARY: 119 mg/dL — AB (ref 65–99)
GLUCOSE-CAPILLARY: 185 mg/dL — AB (ref 65–99)
GLUCOSE-CAPILLARY: 153 mg/dL — AB (ref 65–99)
Glucose-Capillary: 131 mg/dL — ABNORMAL HIGH (ref 65–99)

## 2015-07-20 LAB — PHOSPHORUS: PHOSPHORUS: 2.7 mg/dL (ref 2.5–4.6)

## 2015-07-20 LAB — PREALBUMIN: PREALBUMIN: 19.7 mg/dL (ref 18–38)

## 2015-07-20 LAB — MAGNESIUM: Magnesium: 2 mg/dL (ref 1.7–2.4)

## 2015-07-20 MED ORDER — VITAMIN D 1000 UNITS PO TABS
1000.0000 [IU] | ORAL_TABLET | Freq: Two times a day (BID) | ORAL | Status: DC
  Administered 2015-07-20 – 2015-07-21 (×3): 1000 [IU] via ORAL
  Filled 2015-07-20 (×3): qty 1

## 2015-07-20 MED ORDER — VITAMIN C 500 MG PO TABS
500.0000 mg | ORAL_TABLET | Freq: Every day | ORAL | Status: DC
  Administered 2015-07-20 – 2015-07-21 (×2): 500 mg via ORAL
  Filled 2015-07-20 (×2): qty 1

## 2015-07-20 MED ORDER — ENOXAPARIN SODIUM 80 MG/0.8ML ~~LOC~~ SOLN
80.0000 mg | SUBCUTANEOUS | Status: DC
  Administered 2015-07-20 – 2015-07-21 (×2): 80 mg via SUBCUTANEOUS
  Filled 2015-07-20 (×2): qty 0.8

## 2015-07-20 MED ORDER — VITAMIN D (ERGOCALCIFEROL) 1.25 MG (50000 UNIT) PO CAPS
50000.0000 [IU] | ORAL_CAPSULE | ORAL | Status: DC
  Administered 2015-07-21: 50000 [IU] via ORAL
  Filled 2015-07-20: qty 1

## 2015-07-20 NOTE — Progress Notes (Addendum)
Spoke with pt about needing assistance with home CPAP. Pt states she can place on herself. Checked vitals, SpO2 was 71% on RA. Pt dozing on and off, but not ready for CPAP quite yet. 3LNC placed on pt at this time. RT will continue to monitor.

## 2015-07-20 NOTE — Progress Notes (Signed)
ANTICOAGULATION CONSULT NOTE - Initial Consult  Pharmacy Consult for Lovenox Indication: VTE prophylaxis  Allergies  Allergen Reactions  . Penicillins Hives    Has patient had a PCN reaction causing immediate rash, facial/tongue/throat swelling, SOB or lightheadedness with hypotension: Yes Has patient had a PCN reaction causing severe rash involving mucus membranes or skin necrosis: Yes Has patient had a PCN reaction that required hospitalization No Has patient had a PCN reaction occurring within the last 10 years: No If all of the above answers are "NO", then may proceed with Cephalosporin use.     Patient Measurements: Height: 5\' 2"  (157.5 cm) Weight: (!) 349 lb 8 oz (158.532 kg) IBW/kg (Calculated) : 50.1  Vital Signs: Temp: 98 F (36.7 C) (11/09 0620) Temp Source: Oral (11/09 0620) BP: 121/52 mmHg (11/09 0620) Pulse Rate: 88 (11/09 0620)  Labs:  Recent Labs  07/18/15 1530 07/20/15 0545  HGB 11.9* 11.2*  HCT 37.7 35.0*  PLT 364 328  APTT 29  --   LABPROT 13.9  --   INR 1.05  --   CREATININE 0.69 0.61    Estimated Creatinine Clearance: 138 mL/min (by C-G formula based on Cr of 0.61).   Medical History: Past Medical History  Diagnosis Date  . Hyperlipidemia   . Cushing's disease ([place])   . Morbid obesity ([place])   . Diabetes ([place])   . Hyperlipidemia   . Hypertension   . 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 Vitamin D deficiency 07/19/2015  . Osteoporosis 07/19/2015    Medications:  Scheduled:  . canagliflozin  300 mg Oral QAC breakfast  . cholecalciferol  1,000 Units Oral BID  . cholestyramine  4 g Oral Q1200  . clindamycin (CLEOCIN) IV  600 mg Intravenous Q12H  . fluticasone  1 spray Each Nare Daily  . gabapentin  600 mg Oral BID  . gemfibrozil  600 mg Oral BID AC  . hydrOXYzine  100 mg Oral QHS  . Influenza vac split quadrivalent PF  0.5 mL Intramuscular Tomorrow-1000  . insulin aspart  0-20  Units Subcutaneous TID WC  . insulin aspart protamine- aspart  25 Units Subcutaneous BID WC  . levothyroxine  50 mcg Oral QAC breakfast  . loratadine  10 mg Oral Daily  . pneumococcal 23 valent vaccine  0.5 mL Intramuscular Tomorrow-1000  . venlafaxine XR  37.5 mg Oral QHS  . venlafaxine XR  75 mg Oral Q breakfast  . vitamin C  500 mg Oral Daily  . Vitamin D (Ergocalciferol)  50,000 Units Oral Q7 days    Assessment: 39 yo F s/p fall and R tibial plateau fracture.  Pt s/p ORIF repair and pharmacy was asked to start Lovenox for VTE prophylaxis.  Traditional post-op dosing is 40mg  SQ q24h however based on obesity, will in crease patient's dose to 0.5mg /kg/day (=80mg ).  Goal of Therapy:  Anti-Xa level 0.3-0.6 units/ml 4hrs after LMWH dose given Monitor platelets by anticoagulation protocol: Yes   Plan:  Lovenox 80mg  SQ q24h Follow CBC and SCr q72 hours Monitor for s/sx of bleeding  [employer], Pharm.D., BCPS Clinical Pharmacist Pager [phone] 07/20/2015 10:24 AM

## 2015-07-20 NOTE — NC FL2 (Signed)
[place] MEDICAID FL2 LEVEL OF CARE SCREENING TOOL     IDENTIFICATION  Patient Name: [name] Birthdate: 1976/03/01 Sex: female Admission Date (Current Location): 07/16/2015  [facility] and [place] Number: Herbalist and Address:  [facility] [place], [address] [place], [place] [address]      Provider Number: [id number]  Attending Physician Name and Address:  Delfina Redwood, MD  Relative Name and Phone Number:  Hadlyn Amero, Spouse  [phone] or [phone]    Current Level of Care: Hospital Recommended Level of Care: [place] Prior Approval Number:    Date Approved/Denied:   PASRR Number:  [id number]  Discharge Plan: SNF    Current Diagnoses: Patient Active Problem List   Diagnosis Date Noted  . Vitamin D deficiency 07/19/2015  . 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 Osteoporosis 07/19/2015  . 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 Fracture, tibial plateau 07/16/2015  . Uncontrolled diabetes mellitus with diabetic neuropathy, with long-term current use of insulin ([place]) 07/16/2015  . Leukocytosis 07/16/2015  . Essential hypertension 07/16/2015  . Morbid obesity ([place]) 04/20/2014  . Hyperlipidemia 07/31/2012  . Cushing disease ([place]) 07/31/2012  . Hypothyroid 07/31/2012  . Major depressive disorder, recurrent episode, mild ([place]) 04/08/2012    Orientation ACTIVITIES/SOCIAL BLADDER RESPIRATION    Self, Time, Situation, Place    Continent Other (Comment) (CPAP as needed)  BEHAVIORAL SYMPTOMS/MOOD NEUROLOGICAL BOWEL NUTRITION STATUS      Continent Diet (Carb Modified)  PHYSICIAN VISITS COMMUNICATION OF NEEDS Height & Weight Skin    Verbally 5\' 2"  (157.5 cm) 349 lbs. Surgical wounds          AMBULATORY STATUS RESPIRATION    Supervision limited Other (Comment) (CPAP as needed)      Personal Care  Assistance Level of Assistance  Bathing, Dressing Bathing Assistance: Limited assistance   Dressing Assistance: Limited assistance      Functional Limitations Info                [place]  PT (By licensed PT)     PT Frequency: 7x per week             Additional Factors Info  Psychotropic, Insulin Sliding Scale, Allergies   Allergies Info: PENICILLINS Psychotropic Info: Medications Insulin Sliding Scale Info: 3 times a day       Current Medications (07/20/2015): Current Facility-Administered Medications  Medication Dose Route Frequency Provider Last Rate Last Dose  . acetaminophen (TYLENOL) tablet 650 mg  650 mg Oral Q6H PRN Robbie Lis, MD   650 mg at 07/17/15 1138   Or  . acetaminophen (TYLENOL) suppository 650 mg  650 mg Rectal Q6H PRN Robbie Lis, MD      . canagliflozin [facility]) tablet 300 mg  300 mg Oral QAC breakfast Robbie Lis, MD   300 mg at 07/20/15 0758  . cholecalciferol (VITAMIN D) tablet 1,000 Units  1,000 Units Oral BID Ainsley Spinner, PA-C   1,000 Units at 07/20/15 1054  . cholestyramine (QUESTRAN) packet 4 g  4 g Oral Q1200 Robbie Lis, MD   4 g at 07/20/15 1211  . enoxaparin (LOVENOX) injection 80 mg  80 mg Subcutaneous Q24H Kimberly B Hammons, RPH   80 mg at 07/20/15 1216  . fluticasone (FLONASE) 50 MCG/ACT nasal spray 1 spray  1 spray Each Nare Daily Robbie Lis,  MD   1 spray at 07/18/15 1050  . gabapentin (NEURONTIN) capsule 600 mg  600 mg Oral BID Robbie Lis, MD   600 mg at 07/20/15 1010  . gemfibrozil (LOPID) tablet 600 mg  600 mg Oral BID AC Robbie Lis, MD   600 mg at 07/20/15 0801  . HYDROcodone-acetaminophen (NORCO) 7.5-325 MG per tablet 1-2 tablet  1-2 tablet Oral Q6H PRN Ainsley Spinner, PA-C   2 tablet at 07/20/15 0253  . HYDROmorphone (DILAUDID) injection 1 mg  1 mg Intravenous Q2H PRN Robbie Lis, MD   1 mg at 07/20/15 1226  . hydrOXYzine (ATARAX/VISTARIL) tablet 100 mg  100 mg Oral QHS Robbie Lis, MD    100 mg at 07/19/15 2114  . Influenza vac split quadrivalent PF (FLUARIX) injection 0.5 mL  0.5 mL Intramuscular Tomorrow-1000 Robbie Lis, MD      . insulin aspart (novoLOG) injection 0-20 Units  0-20 Units Subcutaneous TID WC Robbie Lis, MD   4 Units at 07/20/15 1643  . insulin aspart protamine- aspart (NOVOLOG MIX 70/30) injection 25 Units  25 Units Subcutaneous BID WC Robbie Lis, MD   25 Units at 07/20/15 1643  . lactated ringers infusion   Intravenous Continuous Nolon Nations, MD 10 mL/hr at 07/19/15 0818    . levothyroxine (SYNTHROID, LEVOTHROID) tablet 50 mcg  50 mcg Oral QAC breakfast Robbie Lis, MD   50 mcg at 07/20/15 0610  . loratadine (CLARITIN) tablet 10 mg  10 mg Oral Daily Robbie Lis, MD   10 mg at 07/20/15 1010  . methocarbamol (ROBAXIN) tablet 1,000 mg  1,000 mg Oral Q6H PRN Ainsley Spinner, PA-C      . ondansetron [facility]) tablet 4 mg  4 mg Oral Q6H PRN Robbie Lis, MD       Or  . ondansetron [facility]) injection 4 mg  4 mg Intravenous Q6H PRN Robbie Lis, MD      . pneumococcal 23 valent vaccine (PNU-IMMUNE) injection 0.5 mL  0.5 mL Intramuscular Tomorrow-1000 Robbie Lis, MD      . venlafaxine XR (EFFEXOR-XR) 24 hr capsule 37.5 mg  37.5 mg Oral QHS Robbie Lis, MD   37.5 mg at 07/19/15 2117  . venlafaxine XR (EFFEXOR-XR) 24 hr capsule 75 mg  75 mg Oral Q breakfast Robbie Lis, MD   75 mg at 07/20/15 8250  . vitamin C (ASCORBIC ACID) tablet 500 mg  500 mg Oral Daily Ainsley Spinner, PA-C   500 mg at 07/20/15 1054  . Vitamin D (Ergocalciferol) (DRISDOL) capsule 50,000 Units  50,000 Units Oral Q7 days Ainsley Spinner, PA-C      . zolpidem (AMBIEN) tablet 5 mg  5 mg Oral QHS PRN Robbie Lis, MD       Do not use this list as official medication orders. Please verify with discharge summary.  Discharge Medications:   Medication List    ASK your doctor about these medications        ADVIL MIGRAINE 200 MG Caps  Generic drug:  Ibuprofen  Take 400 mg by mouth 2 (two)  times daily as needed (pain).     canagliflozin 300 MG Tabs tablet  Commonly known as:  INVOKANA  Take 300 mg by mouth daily before breakfast.     Cetirizine HCl 10 MG Caps  Commonly known as:  ZYRTEC ALLERGY  Take 1 capsule (10 mg total) by mouth daily.     cholestyramine  4 G packet  Commonly known as:  QUESTRAN  Take 1 packet (4 g total) by mouth 2 (two) times daily with a meal.     clindamycin 300 MG capsule  Commonly known as:  CLEOCIN  Take 900 mg by mouth as needed. Before dental appointments     fluticasone 50 MCG/ACT nasal spray  Commonly known as:  FLONASE  1 spray each nares bid     gabapentin 300 MG capsule  Commonly known as:  NEURONTIN  Take 600 mg by mouth 2 (two) times daily.     gemfibrozil 600 MG tablet  Commonly known as:  LOPID  Take 600 mg by mouth 2 (two) times daily before a meal.     hydrOXYzine 50 MG capsule  Commonly known as:  VISTARIL  Take 100 mg by mouth at bedtime. sleep     insulin NPH-regular Human (70-30) 100 UNIT/ML injection  Commonly known as:  NOVOLIN 70/30  Inject 50 Units into the skin 2 (two) times daily with a meal.     levothyroxine 100 MCG tablet  Commonly known as:  SYNTHROID, LEVOTHROID  Take 50 mcg by mouth every morning.     quinapril 5 MG tablet  Commonly known as:  ACCUPRIL  Take 2.5 mg by mouth every morning.     venlafaxine XR 37.5 MG 24 hr capsule  Commonly known as:  EFFEXOR-XR  Take 37.5-75 mg by mouth 2 (two) times daily. Take 2 capsules= 75mg  in the morning and take 1 capsule = 37.5mg  at bedtime     zolpidem 6.25 MG CR tablet  Commonly known as:  AMBIEN CR  Take 6.25 mg by mouth at bedtime as needed for sleep.        Relevant Imaging Results:  Relevant Lab Results:  Recent Labs    Additional Information    [place], Jones [place], LCSWA

## 2015-07-20 NOTE — Clinical Social Work Placement (Signed)
   CLINICAL SOCIAL WORK PLACEMENT  NOTE  Date:  07/20/2015  Patient Details  Name: [name] MRN: [id number] Date of Birth: 01-Apr-1976  Clinical Social Work is seeking post-discharge placement for this patient at the [place] level of care (*CSW will initial, date and re-position this form in  chart as items are completed):  Yes   Patient/family provided with [facility] Work Department's list of facilities offering this level of care within the geographic area requested by the patient (or if unable, by the patient's family).  Yes   Patient/family informed of their freedom to choose among providers that offer the needed level of care, that participate in Medicare, Medicaid or managed care program needed by the patient, have an available bed and are willing to accept the patient.  Yes   Patient/family informed of [facility]'s ownership interest in [facility] and [facility], as well as of the fact that they are under no obligation to receive care at these facilities.  PASRR submitted to EDS on       PASRR number received on       Existing PASRR number confirmed on       FL2 transmitted to all facilities in geographic area requested by pt/family on 07/20/15     FL2 transmitted to all facilities within larger geographic area on       Patient informed that his/her managed care company has contracts with or will negotiate with certain facilities, including the following:            Patient/family informed of bed offers received.  Patient chooses bed at       Physician recommends and patient chooses bed at      Patient to be transferred to   on  .  Patient to be transferred to facility by       Patient family notified on   of transfer.  Name of family member notified:        PHYSICIAN Please sign FL2     Additional Comment:    _______________________________________________ Ross Ludwig, LCSWA 07/20/2015, 6:30 PM

## 2015-07-20 NOTE — Progress Notes (Signed)
[facility] Progress Note  Subjective  Feeling much better than yesterday No specific complaints   ROS Improved pain R leg  Chronic neck and back pain  Chronic Left ankle weakness   Objective   BP 121/52 mmHg  Pulse 88  Temp(Src) 98 F (36.7 C) (Oral)  Resp 18  Ht 5' 2"  (1.575 m)  Wt 158.532 kg (349 lb 8 oz)  BMI 63.91 kg/m2  SpO2 95%  LMP   Intake/Output      11/08 0701 - 11/09 0700 11/09 0701 - 11/10 0700   P.O. 360    I.V. (mL/kg) 2980.3 (18.8)    IV Piggyback 50    Total Intake(mL/kg) 3390.3 (21.4)    Urine (mL/kg/hr) 3150 (0.8)    Blood 150 (0)    Total Output 3300     Net +90.3          Urine Occurrence 1 x      Labs  Results for Brooking, Vianey L (MRN [id number]) as of 07/20/2015 09:28  Ref. Range 07/20/2015 05:45  Sodium Latest Ref Range: 135-145 mmol/L 136  Potassium Latest Ref Range: 3.5-5.1 mmol/L 4.8  Chloride Latest Ref Range: 101-111 mmol/L 103  CO2 Latest Ref Range: 22-32 mmol/L 20 (L)  BUN Latest Ref Range: 6-20 mg/dL 13  Creatinine Latest Ref Range: 0.44-1.00 mg/dL 0.61  Calcium Latest Ref Range: 8.9-10.3 mg/dL 8.8 (L)  EGFR (Non-African Amer.) Latest Ref Range: >60 mL/min >60  EGFR (African American) Latest Ref Range: >60 mL/min >60  Glucose Latest Ref Range: 65-99 mg/dL 138 (H)  Anion gap Latest Ref Range: 5-15  13  Phosphorus Latest Ref Range: 2.5-4.6 mg/dL 2.7  Magnesium Latest Ref Range: 1.7-2.4 mg/dL 2.0  Alkaline Phosphatase Latest Ref Range: 38-126 U/L 76  Albumin Latest Ref Range: 3.5-5.0 g/dL 2.8 (L)  AST Latest Ref Range: 15-41 U/L 18  ALT Latest Ref Range: 14-54 U/L 23  Total Protein Latest Ref Range: 6.5-8.1 g/dL 6.5  Total Bilirubin Latest Ref Range: 0.3-1.2 mg/dL 0.6  PREALBUMIN Latest Ref Range: 18-38 mg/dL 19.7  WBC Latest Ref Range: 4.0-10.5 K/uL 9.6  RBC Latest Ref Range: 3.87-5.11 MIL/uL 4.10  Hemoglobin Latest Ref Range: 12.0-15.0 g/dL 11.2 (L)  HCT Latest Ref Range: 36.0-46.0 % 35.0 (L)  MCV Latest  Ref Range: 78.0-100.0 fL 85.4  MCH Latest Ref Range: 26.0-34.0 pg 27.3  MCHC Latest Ref Range: 30.0-36.0 g/dL 32.0  RDW Latest Ref Range: 11.5-15.5 % 14.4  Platelets Latest Ref Range: 150-400 K/uL 328   CBG (last 3)   Recent Labs  07/19/15 1611 07/19/15 2247 07/20/15 0638  GLUCAP 146* 139* 119*     Results for Girdner, Aybree L (MRN [id number]) as of 07/20/2015 09:28  Ref. Range 07/18/2015 15:30  Vit D, 25-Hydroxy Latest Ref Range: 30.0-100.0 ng/mL 11.6 (L)    Exam  Gen: awake and alert, NAD, sitting up in bed knitting  Lungs: clear anterior fields  Cardiac: s1 and s2 Abd:+ BS Ext:       Right Lower Extremity   Dressing c/d/i  Distal motor and sensory functions intact  Ext warm  + DP pulse  No DCT   Compartments soft and NT   Assessment and Plan   POD/HD#: 1  39 y/o female with multiple medical comorbidities s/p ground level fall and R periprosthetic proximal tibia fracture  1.      A) R periprosthetic tibial plateau fracture s/p ORIF  NWB x 8 weeks  Unrestricted ROM R knee  PT/OT evals  Ice and elevate as needed  Ok to shower with assistance    Dressing changes starting tomorrow        B)   Chronic L ankle laxity   Requested pt obtain records from podiatrist who performed surgery on her to determine exactly what was done  WBAT tolerated for now  Was not using bracing PTA  2. Pain management:  Continue with current management  3. ABL anemia/Hemodynamics  Stable  4. Medical issues   Per primary service  Tight sugar control to dec infection risk and inc chance of fx union   5. DVT/PE prophylaxis:  lovenox x 14 days post op   6. ID:   Peri-op abx  7. Metabolic Bone Disease:  Vitamin D deficiency    Supplement   Additional labs pending   Pt TSH elevated on admission, may need med adjustment  8. Activity:  See #1  9. FEN/Foley/Lines:  Carb mod diet   10. Impediments to fracture healing:  cushings  DM  Chronic neck/back pain (fall  risk)  Vitamin d deficiency   11. Morbid obesity   12. Dispo:  Therapy evals     Jari Pigg, PA-C [facility] [phone] (O) 07/20/2015 9:25 AM

## 2015-07-20 NOTE — Evaluation (Signed)
Occupational Therapy Evaluation Patient Details Name: [name] MRN: [id number] DOB: 1976/07/27 Today's Date: 07/20/2015    History of Present Illness 39 year old female who fell while out shopping and is now s/p ORIF Rt tibial plateau fracture diagnoses PMH: morbid obesity, Rt uni knee replacement, Lt ankle surgery, hypertension, dyslipidemia, hypothyroidism     Clinical Impression   Pt reports she was independent with ADLs PTA. Began ADL and safety education; pt verbalized understanding. Pt unable to maintain NWB status on RLE during sit <> stand despite max verbal cues. At this time, recommending SNF for further rehab to maximize independence and safety with ADLs and functional mobility prior to return home. Pt would like to go home directly from the hospital but is agreeable to SNF if she is not ready to go straight home upon d/c. Pt would benefit from continued skilled OT in order to increase independence and safety with LB ADLs and functional transfers.     Follow Up Recommendations  SNF;Supervision - Intermittent    Equipment Recommendations  Tub/shower bench;Other (comment) (AE: lh sponge, lh shoe horn, reacher, sock aide)    Recommendations for Other Services       Precautions / Restrictions Precautions Precautions: Fall Precaution Comments: cleared for full rt knee ROM Restrictions Weight Bearing Restrictions: Yes RLE Weight Bearing: Non weight bearing      Mobility Bed Mobility Overal bed mobility: Needs Assistance Bed Mobility: Supine to Sit     Supine to sit: Max assist;HOB elevated     General bed mobility comments: Pt in recliner, returned to recliner at end of session.  Transfers Overall transfer level: Needs assistance Equipment used: Rolling walker (2 wheeled) Transfers: Sit to/from Stand Sit to Stand: Mod assist Stand pivot transfers: Mod assist;+2 physical assistance       General transfer comment: Mod A to boost up from chair. Pt unable  to maintain NWB on RLE despite max verbal cues.    Balance Overall balance assessment: Needs assistance Sitting-balance support: No upper extremity supported Sitting balance-Leahy Scale: Fair     Standing balance support: Bilateral upper extremity supported Standing balance-Leahy Scale: Poor Standing balance comment: RW for support                            ADL Overall ADL's : Needs assistance/impaired Eating/Feeding: Set up;Sitting   Grooming: Set up;Sitting               Lower Body Dressing: Maximal assistance;Sit to/from stand   Toilet Transfer: Moderate assistance;Stand-pivot;BSC;RW   Toileting- Clothing Manipulation and Hygiene: Maximal assistance;Sit to/from stand         General ADL Comments: No family present for OT eval. Educated pt on compensatory strategies for LB ADLs, safety with RW; pt verbalized understanding. Pt with difficulty maintaining NWB on RLE during sit <> stand despite max verbal cues.     Vision     Perception     Praxis      Pertinent Vitals/Pain Pain Assessment: 0-10 Pain Score: 7  Pain Location: R LE Pain Descriptors / Indicators: Aching;Sore Pain Intervention(s): Limited activity within patient's tolerance;Monitored during session     Hand Dominance     Extremity/Trunk Assessment Upper Extremity Assessment Upper Extremity Assessment: Generalized weakness   Lower Extremity Assessment Lower Extremity Assessment: Defer to PT evaluation RLE Deficits / Details: needing assistance to move Rt LE with bed mobility   Cervical / Trunk Assessment Cervical / Trunk Assessment:  Normal   Communication Communication Communication: No difficulties   Cognition Arousal/Alertness: Awake/alert Behavior During Therapy: WFL for tasks assessed/performed Overall Cognitive Status: Within Functional Limits for tasks assessed                     General Comments       Exercises       Shoulder Instructions       Home Living Family/patient expects to be discharged to:: Unsure Living Arrangements: Spouse/significant other Available Help at Discharge: Family;Available PRN/intermittently;Other (Comment) (husband works and cannot be home consistently) Type of Home: House Home Access: Stairs to enter Technical brewer of Steps: 2 Entrance Stairs-Rails: None Home Layout: One level     Bathroom Shower/Tub: Tub/shower unit;Walk-in shower   Bathroom Toilet: Standard Bathroom Accessibility: Yes How Accessible: Accessible via walker Home Equipment: Roper - 2 wheels;Bedside commode          Prior Functioning/Environment Level of Independence: Independent             OT Diagnosis: Generalized weakness;Acute pain   OT Problem List: Decreased strength;Decreased activity tolerance;Impaired balance (sitting and/or standing);Decreased safety awareness;Decreased knowledge of use of DME or AE;Decreased knowledge of precautions;Obesity;Pain   OT Treatment/Interventions: Self-care/ADL training;Therapeutic exercise;DME and/or AE instruction;Therapeutic activities;Patient/family education    OT Goals(Current goals can be found in the care plan section) Acute Rehab OT Goals Patient Stated Goal: get out of the hospital OT Goal Formulation: With patient Time For Goal Achievement: 08/03/15 Potential to Achieve Goals: Good ADL Goals Pt Will Perform Grooming: standing;with min guard assist (maintaining NWB status) Pt Will Perform Lower Body Bathing: sit to/from stand;with min assist (with or without AE, maintaining NWB status ) Pt Will Perform Lower Body Dressing: with min assist;with adaptive equipment;sit to/from stand (with or without AE, maintaining NWB status) Pt Will Transfer to Toilet: with min assist;stand pivot transfer;bedside commode (maintaining NWB status) Pt Will Perform Toileting - Clothing Manipulation and hygiene: with min guard assist;sit to/from stand (maintaining NWB status) Pt  Will Perform Tub/Shower Transfer: Tub transfer;with min assist;Stand pivot transfer;rolling walker (tub bench vs 3 in 1, maintaining NWB status) Additional ADL Goal #1: Pt will increase UB strength through independence with HEP required for participation in ADLs and functional mobility.   OT Frequency: Min 2X/week   Barriers to D/C: Decreased caregiver support  Husband works during the day and cannot provide 24/7 assist.       Co-evaluation              End of Session Equipment Utilized During Treatment: Gait belt;Rolling walker  Activity Tolerance: Patient tolerated treatment well Patient left: in chair;with call bell/phone within reach;with nursing/sitter in room   Time: 1200-1217 OT Time Calculation (min): 17 min Charges:  OT General Charges $OT Visit: 1 Procedure OT Evaluation $Initial OT Evaluation Tier I: 1 Procedure G-Codes:     Binnie Kand M.S., OTR/L Pager: [phone]  07/20/2015, 1:18 PM

## 2015-07-20 NOTE — Progress Notes (Signed)
Patient ID: [name], female   DOB: 12-03-1975, 39 y.o.   MRN: [id number] [facility] PROGRESS NOTE  [name] BBC:[id number] DOB: [birth year] DOA: 07/16/2015 PCP: Vladimir Creeks, MD  Brief narrative:    39 year old female with past medical history of morbid obesity, hypertension, dyslipidemia, hypothyroidism who presented to [facility] ED status post fall while shopping.   X rays demonstrated right tibial plateau fracture. Transferred to [facility] For surgery by ortho on 11/8.    Assessment/Plan:     Fracture, tibial plateau - S/P mechanical fall S/p repair. Unrestricted ROM. NWB 8 weeks. Will need SNF   Major depressive disorder, recurrent episode, mild (HCC) - Continue Effexor   Hyperlipidemia - Continue gemfibrozil   Cushing disease ([place]) / Morbid obesity ([place])   Hypothyroid - Continue synthroid 50 mcg daily     Uncontrolled diabetes mellitus with diabetic neuropathy, with long-term current use of insulin (HCC) - A1c 7 - Continue insulin 25 units BID (reduced to half her dose due to frequent hypoglycemic episodes) - We will continue invokana - We will continue gabapentin 600 mg PO BID for diabetic neuropathy    Leukocytosis - Likely reactive - No obvious source of infection   Essential hypertension - Continue lisinopril 5 mg daily   DVT prophylaxis:  enoxaparin  Code Status: Full.  Family Communication:  At bedside Disposition Plan: SNF   Medical Consultants:  Ortho  Other Consultants:  PT  IAnti-Infectives:   None    Delfina Redwood, MD [facility] [email or web address] password [email or web address]  Time spent in minutes: 15 minutes  07/20/2015, 2:55 PM   LOS: 4 days    HPI/Subjective: Pain controlled. No new complaints  Objective: Filed Vitals:   07/19/15 1236 07/19/15 2020 07/20/15 0620 07/20/15 1300  BP: 127/73 113/58 121/52 125/57  Pulse: 108 100 88 76  Temp: 98.8 F (37.1 C) 98.9 F (37.2 C) 98 F (36.7 C) 98.1 F (36.7 C)   TempSrc:  Oral Oral Oral  Resp: 20 18 18 18   Height:      Weight:   158.532 kg (349 lb 8 oz)   SpO2:  100% 95% 96%    Intake/Output Summary (Last 24 hours) at 07/20/15 1455 Last data filed at 07/20/15 1300  Gross per 24 hour  Intake    867 ml  Output   2950 ml  Net  -2083 ml    Exam:   General:  Pt is not in acute distress, morbidly obese. In chair a and o  Cardiovascular: RRR, S1/S2 (+)  Respiratory: No wheezing, no crackles, no rhonchi  Abdomen: (+) BS, non tender   Extremities: No leg swelling, pulses palpable  Neuro: Nonfocal  Data Reviewed: Basic Metabolic Panel:  Recent Labs Lab 07/16/15 1727 07/16/15 1950 07/17/15 0440 07/18/15 1530 07/20/15 0545  NA 139 139 138 138 136  K 4.0 4.1 4.7 4.4 4.8  CL 103 104 102 104 103  CO2 27 27 28 28  20*  GLUCOSE 57* 71 138* 149* 138*  BUN 12 12 12 16 13   CREATININE 0.64 0.67 0.69 0.69 0.61  CALCIUM 9.0 9.1 8.8* 9.1 8.8*  MG  --  1.8  --   --  2.0  PHOS  --  4.6  --   --  2.7   Liver Function Tests:  Recent Labs Lab 07/16/15 1950 07/17/15 0440 07/18/15 1530 07/20/15 0545  AST 38 37 17 18  ALT 25 33 25 23  ALKPHOS 97 104 83 76  BILITOT 0.4 0.7  0.6 0.6  PROT 7.1 6.9 7.2 6.5  ALBUMIN 3.3* 3.3* 3.1* 2.8*   No results for input(s): LIPASE, AMYLASE in the last 168 hours. No results for input(s): AMMONIA in the last 168 hours. CBC:  Recent Labs Lab 07/16/15 1727 07/16/15 1950 07/17/15 0440 07/18/15 1530 07/20/15 0545  WBC 12.3* 11.8* 10.2 8.5 9.6  NEUTROABS 10.0* 9.6*  --   --   --   HGB 12.9 12.7 12.2 11.9* 11.2*  HCT 40.0 39.6 39.3 37.7 35.0*  MCV 85.3 85.5 86.6 86.3 85.4  PLT 360 373 364 364 328   Cardiac Enzymes: No results for input(s): CKTOTAL, CKMB, CKMBINDEX, TROPONINI in the last 168 hours. BNP: Invalid input(s): POCBNP CBG:  Recent Labs Lab 07/19/15 1230 07/19/15 1611 07/19/15 2247 07/20/15 0638 07/20/15 1102  GLUCAP 167* 146* 139* 119* 131*    Recent Results (from the past  240 hour(s))  Surgical pcr screen     Status: Abnormal   Collection Time: 07/18/15 10:17 PM  Result Value Ref Range Status   MRSA, PCR NEGATIVE NEGATIVE Final   Staphylococcus aureus POSITIVE (A) NEGATIVE Final    Comment:        The Xpert SA Assay (FDA approved for NASAL specimens in patients over 43 years of age), is one component of a comprehensive surveillance program.  Test performance has been validated by [facility] for patients greater than or equal to 35 year old. It is not intended to diagnose infection nor to guide or monitor treatment.   Surgical pcr screen     Status: Abnormal   Collection Time: 07/19/15 12:34 AM  Result Value Ref Range Status   MRSA, PCR NEGATIVE NEGATIVE Final   Staphylococcus aureus POSITIVE (A) NEGATIVE Final    Comment:        The Xpert SA Assay (FDA approved for NASAL specimens in patients over 64 years of age), is one component of a comprehensive surveillance program.  Test performance has been validated by [facility] for patients greater than or equal to 77 year old. It is not intended to diagnose infection nor to guide or monitor treatment.      Scheduled Meds: . canagliflozin  300 mg Oral QAC breakfast  . cholestyramine  4 g Oral Q1200  . fluticasone  1 spray Each Nare Daily  . gabapentin  600 mg Oral BID  . gemfibrozil  600 mg Oral BID AC  . hydrOXYzine  100 mg Oral QHS  . insulin aspart  0-20 Units Subcutaneous TID WC  . insulin aspart protamine- aspart  25 Units Subcutaneous BID WC  . levothyroxine  50 mcg Oral QAC breakfast  . lisinopril  5 mg Oral Daily  . loratadine  10 mg Oral Daily  . venlafaxine XR  37.5 mg Oral QHS  . venlafaxine XR  75 mg Oral Q breakfast   Continuous Infusions: . lactated ringers 10 mL/hr at 07/19/15 0818

## 2015-07-20 NOTE — Evaluation (Signed)
Physical Therapy Evaluation Patient Details Name: [name] MRN: [id number] DOB: 08-24-1976 Today's Date: 07/20/2015   History of Present Illness  39 year old female who fell while out shopping and is now s/p ORIF Rt tibial plateau fracture diagnoses PMH: morbid obesity, Rt uni knee replacement, Lt ankle surgery, hypertension, dyslipidemia, hypothyroidism    Clinical Impression  Patient is s/p above surgery resulting in functional limitations due to the deficits listed below (see PT Problem List). Patient will benefit from skilled PT to increase their independence and safety with mobility to allow discharge to the venue listed below. Patient requiring +2 assistance with transfers at this time. With initial mobilization the patient was not able to perform transfers while maintaining NWB status. Unable to safely attempt ambulation at this time, patient  may require the use of a wheelchair for mobility.        Follow Up Recommendations SNF;Supervision for mobility/OOB    Equipment Recommendations  None recommended by PT;Other (comment) (patient reports having rw at home. ) Will follow for possible need for wheelchair.   Recommendations for Other Services       Precautions / Restrictions Precautions Precautions: Fall Precaution Comments: cleared for full rt knee ROM Restrictions Weight Bearing Restrictions: Yes RLE Weight Bearing: Non weight bearing      Mobility  Bed Mobility Overal bed mobility: Needs Assistance Bed Mobility: Supine to Sit     Supine to sit: Max assist;HOB elevated     General bed mobility comments: Patient needing physical assistance to get to sitting edge of bed. Assistance needed at trunk and Rt LE.   Transfers Overall transfer level: Needs assistance Equipment used: Rolling walker (2 wheeled) Transfers: Sit to/from [facility] Sit to Stand: +2 physical assistance;Mod assist Stand pivot transfers: Mod assist;+2 physical assistance        General transfer comment: Patient not maintaining NWB status through Rt LE. Repeated cues needed for NWB status, especially with initial stand. Able to pivot with weight primarily through LLE.   Ambulation/Gait             General Gait Details: Unable to safety attempt ambulation at this time.   Stairs            Wheelchair Mobility    Modified Rankin (Stroke Patients Only)       Balance Overall balance assessment: Needs assistance Sitting-balance support: No upper extremity supported Sitting balance-Leahy Scale: Fair     Standing balance support: Bilateral upper extremity supported Standing balance-Leahy Scale: Poor Standing balance comment: using rw and physical assistance                             Pertinent Vitals/Pain Pain Assessment: 0-10 Pain Score: 5  Pain Location: Rt knee Pain Descriptors / Indicators: Aching;Sore Pain Intervention(s): Limited activity within patient's tolerance;Monitored during session    [place] expects to be discharged to:: Unsure Living Arrangements: Spouse/significant other Available Help at Discharge: Family;Available PRN/intermittently;Other (Comment) (husband works and will not be home consistently) Type of Home: House Home Access: Stairs to enter Entrance Stairs-Rails: None Entrance Stairs-Number of Steps: 2 Home Layout: One level Home Equipment: Walker - 2 wheels      Prior Function Level of Independence: Independent               Hand Dominance        Extremity/Trunk Assessment   Upper Extremity Assessment: Defer to OT evaluation  Lower Extremity Assessment: RLE deficits/detail RLE Deficits / Details: needing assistance to move Rt LE with bed mobility       Communication   Communication: No difficulties  Cognition Arousal/Alertness: Awake/alert Behavior During Therapy: WFL for tasks assessed/performed Overall Cognitive Status: Within Functional  Limits for tasks assessed                      General Comments General comments (skin integrity, edema, etc.): Patient reporting that she wants to be able to get out of the hospital but admits that she would not be able to safely go home at this time.     Exercises        Assessment/Plan    PT Assessment Patient needs continued PT services  PT Diagnosis Difficulty walking;Generalized weakness;Acute pain   PT Problem List Decreased strength;Decreased range of motion;Decreased activity tolerance;Decreased balance;Decreased mobility;Decreased knowledge of use of DME;Decreased safety awareness;Obesity;Pain  PT Treatment Interventions DME instruction;Gait training;Stair training;Functional mobility training;Therapeutic activities;Therapeutic exercise;Balance training;Patient/family education;Wheelchair mobility training   PT Goals (Current goals can be found in the Care Plan section) Acute Rehab PT Goals Patient Stated Goal: get out of the hospital PT Goal Formulation: With patient Time For Goal Achievement: 08/03/15 Potential to Achieve Goals: Good    Frequency Min 3X/week   Barriers to discharge Decreased caregiver support;Inaccessible home environment      Co-evaluation               End of Session Equipment Utilized During Treatment: Gait belt Activity Tolerance: Patient limited by fatigue;Other (comment) (inability to perform mobility and maintain NWB status) Patient left: in chair;with call bell/phone within reach;with family/visitor present Nurse Communication: Mobility status;Precautions;Weight bearing status;Other (comment) (recommending transfers toward Lt side. )         Time: 9323-5573 PT Time Calculation (min) (ACUTE ONLY): 34 min   Charges:   PT Evaluation $Initial PT Evaluation Tier I: 1 Procedure PT Treatments $Therapeutic Activity: 8-22 mins   PT G Codes:        Cassell Clement, PT, CSCS Pager [phone] Office [phone]  07/20/2015, 12:11 PM

## 2015-07-21 DIAGNOSIS — E0840 Diabetes mellitus due to underlying condition with diabetic neuropathy, unspecified: Secondary | ICD-10-CM | POA: Insufficient documentation

## 2015-07-21 DIAGNOSIS — E0865 Diabetes mellitus due to underlying condition with hyperglycemia: Secondary | ICD-10-CM

## 2015-07-21 DIAGNOSIS — Z794 Long term (current) use of insulin: Secondary | ICD-10-CM

## 2015-07-21 DIAGNOSIS — IMO0002 Reserved for concepts with insufficient information to code with codable children: Secondary | ICD-10-CM | POA: Insufficient documentation

## 2015-07-21 LAB — GLUCOSE, CAPILLARY
GLUCOSE-CAPILLARY: 124 mg/dL — AB (ref 65–99)
GLUCOSE-CAPILLARY: 112 mg/dL — AB (ref 65–99)
GLUCOSE-CAPILLARY: 163 mg/dL — AB (ref 65–99)

## 2015-07-21 LAB — CBC
HEMATOCRIT: 35.7 % — AB (ref 36.0–46.0)
HEMOGLOBIN: 11.3 g/dL — AB (ref 12.0–15.0)
MCH: 27.4 pg (ref 26.0–34.0)
MCHC: 31.7 g/dL (ref 30.0–36.0)
MCV: 86.4 fL (ref 78.0–100.0)
Platelets: 369 10*3/uL (ref 150–400)
RBC: 4.13 MIL/uL (ref 3.87–5.11)
RDW: 14.6 % (ref 11.5–15.5)
WBC: 8.1 10*3/uL (ref 4.0–10.5)

## 2015-07-21 LAB — PTH, INTACT AND CALCIUM
CALCIUM TOTAL (PTH): 8.6 mg/dL — AB (ref 8.7–10.2)
PTH: 49 pg/mL (ref 15–65)

## 2015-07-21 MED ORDER — HYDROCODONE-ACETAMINOPHEN 7.5-325 MG PO TABS: 1.0000 | ORAL_TABLET | Freq: Four times a day (QID) | ORAL | Status: DC | PRN

## 2015-07-21 MED ORDER — ASCORBIC ACID 500 MG PO TABS: 500.0000 mg | ORAL_TABLET | Freq: Every day | ORAL | Status: DC

## 2015-07-21 MED ORDER — VITAMIN D (ERGOCALCIFEROL) 1.25 MG (50000 UNIT) PO CAPS: 50000.0000 [IU] | ORAL_CAPSULE | ORAL | Status: DC

## 2015-07-21 MED ORDER — LEVOTHYROXINE SODIUM 75 MCG PO TABS: 75.0000 ug | ORAL_TABLET | Freq: Every morning | ORAL | Status: DC

## 2015-07-21 MED ORDER — INSULIN NPH ISOPHANE & REGULAR (70-30) 100 UNIT/ML ~~LOC~~ SUSP: 28.0000 [IU] | Freq: Two times a day (BID) | SUBCUTANEOUS | Status: DC

## 2015-07-21 MED ORDER — CHOLECALCIFEROL 25 MCG (1000 UT) PO TABS
1000.0000 [IU] | ORAL_TABLET | Freq: Two times a day (BID) | ORAL | Status: DC
Start: 2015-07-21 — End: 2016-10-04

## 2015-07-21 MED ORDER — ENOXAPARIN SODIUM 80 MG/0.8ML ~~LOC~~ SOLN: 80.0000 mg | SUBCUTANEOUS | Status: DC

## 2015-07-21 MED ORDER — ACETAMINOPHEN 325 MG PO TABS: 650.0000 mg | ORAL_TABLET | Freq: Four times a day (QID) | ORAL | Status: DC | PRN

## 2015-07-21 MED ORDER — ZOLPIDEM TARTRATE ER 6.25 MG PO TBCR: 6.2500 mg | EXTENDED_RELEASE_TABLET | Freq: Every evening | ORAL | Status: DC | PRN

## 2015-07-21 NOTE — Discharge Instructions (Signed)
Orthopaedic Trauma Service Discharge Instructions   General Discharge Instructions  WEIGHT BEARING STATUS: Nonweightbearing R leg  RANGE OF MOTION/ACTIVITY: unrestricted range of motion R knee  Wound Care: dressing changes as needed. Can leave open if no drainage. Ok to shower. See detailed instructions below   PAIN MEDICATION USE AND EXPECTATIONS  You have likely been given narcotic medications to help control your pain.  After a traumatic event that results in an fracture (broken bone) with or without surgery, it is ok to use narcotic pain medications to help control one's pain.  We understand that everyone responds to pain differently and each individual patient will be evaluated on a regular basis for the continued need for narcotic medications. Ideally, narcotic medication use should last no more than 6-8 weeks (coinciding with fracture healing).   As a patient it is your responsibility as well to monitor narcotic medication use and report the amount and frequency you use these medications when you come to your office visit.   We would also advise that if you are using narcotic medications, you should take a dose prior to therapy to maximize you participation.  IF YOU ARE ON NARCOTIC MEDICATIONS IT IS NOT PERMISSIBLE TO OPERATE A MOTOR VEHICLE (MOTORCYCLE/CAR/TRUCK/MOPED) OR HEAVY MACHINERY DO NOT MIX NARCOTICS WITH OTHER CNS (CENTRAL NERVOUS SYSTEM) DEPRESSANTS SUCH AS ALCOHOL  Diet: as you were eating previously.  Can use over the counter stool softeners and bowel preparations, such as Miralax, to help with bowel movements.  Narcotics can be constipating.  Be sure to drink plenty of fluids    STOP SMOKING OR USING NICOTINE PRODUCTS!!!!  As discussed nicotine severely impairs your body's ability to heal surgical and traumatic wounds but also impairs bone healing.  Wounds and bone heal by forming microscopic blood vessels (angiogenesis) and nicotine is a vasoconstrictor (essentially,  shrinks blood vessels).  Therefore, if vasoconstriction occurs to these microscopic blood vessels they essentially disappear and are unable to deliver necessary nutrients to the healing tissue.  This is one modifiable factor that you can do to dramatically increase your chances of healing your injury.    (This means no smoking, no nicotine gum, patches, etc)  DO NOT USE NONSTEROIDAL ANTI-INFLAMMATORY DRUGS (NSAID'S)  Using products such as Advil (ibuprofen), Aleve (naproxen), Motrin (ibuprofen) for additional pain control during fracture healing can delay and/or prevent the healing response.  If you would like to take over the counter (OTC) medication, Tylenol (acetaminophen) is ok.  However, some narcotic medications that are given for pain control contain acetaminophen as well. Therefore, you should not exceed more than 4000 mg of tylenol in a day if you do not have liver disease.  Also note that there are may OTC medicines, such as cold medicines and allergy medicines that my contain tylenol as well.  If you have any questions about medications and/or interactions please ask your doctor/PA or your pharmacist.      ICE AND ELEVATE INJURED/OPERATIVE EXTREMITY  Using ice and elevating the injured extremity above your heart can help with swelling and pain control.  Icing in a pulsatile fashion, such as 20 minutes on and 20 minutes off, can be followed.    Do not place ice directly on skin. Make sure there is a barrier between to skin and the ice pack.    Using frozen items such as frozen peas works well as the conform nicely to the are that needs to be iced.  USE AN ACE WRAP OR TED HOSE FOR  SWELLING CONTROL  In addition to icing and elevation, Ace wraps or TED hose are used to help limit and resolve swelling.  It is recommended to use Ace wraps or TED hose until you are informed to stop.    When using Ace Wraps start the wrapping distally (farthest away from the body) and wrap proximally (closer to the  body)   Example: If you had surgery on your leg or thing and you do not have a splint on, start the ace wrap at the toes and work your way up to the thigh        If you had surgery on your upper extremity and do not have a splint on, start the ace wrap at your fingers and work your way up to the upper arm  IF YOU ARE IN A SPLINT OR CAST DO NOT [place]   If your splint gets wet for any reason please contact the office immediately. You may shower in your splint or cast as long as you keep it dry.  This can be done by wrapping in a cast cover or garbage back (or similar)  Do Not stick any thing down your splint or cast such as pencils, money, or hangers to try and scratch yourself with.  If you feel itchy take benadryl as prescribed on the bottle for itching  IF YOU ARE IN A CAM BOOT (BLACK BOOT)  You may remove boot periodically. Perform daily dressing changes as noted below.  Wash the liner of the boot regularly and wear a sock when wearing the boot. It is recommended that you sleep in the boot until told otherwise  CALL THE OFFICE WITH ANY QUESTIONS OR CONCERTS: [phone]     Discharge Pin Site Instructions  Dress pins daily with Kerlix roll starting on POD 2. Wrap the Kerlix so that it tamps the skin down around the pin-skin interface to prevent/limit motion of the skin relative to the pin.  (Pin-skin motion is the primary cause of pain and infection related to external fixator pin sites).  Remove any crust or coagulum that may obstruct drainage with a saline moistened gauze or soap and water.  After POD 3, if there is no discernable drainage on the pin site dressing, the interval for change can by increased to every other day.  You may shower with the fixator, cleaning all pin sites gently with soap and water.  If you have a surgical wound this needs to be completely dry and without drainage before showering.  The extremity can be lifted by the fixator to facilitate  wound care and transfers.  Notify the office/Doctor if you experience increasing drainage, redness, or pain from a pin site, or if you notice purulent (thick, snot-like) drainage.  Discharge Wound Care Instructions  Do NOT apply any ointments, solutions or lotions to pin sites or surgical wounds.  These prevent needed drainage and even though solutions like hydrogen peroxide kill bacteria, they also damage cells lining the pin sites that help fight infection.  Applying lotions or ointments can keep the wounds moist and can cause them to breakdown and open up as well. This can increase the risk for infection. When in doubt call the office.  Surgical incisions should be dressed daily.  If any drainage is noted, use one layer of adaptic, then gauze, Kerlix, and an ace wrap.  Once the incision is completely dry and without drainage, it may be left open to air out.  Showering  may begin 36-48 hours later.  Cleaning gently with soap and water.  Traumatic wounds should be dressed daily as well.    One layer of adaptic, gauze, Kerlix, then ace wrap.  The adaptic can be discontinued once the draining has ceased    If you have a wet to dry dressing: wet the gauze with saline the squeeze as much saline out so the gauze is moist (not soaking wet), place moistened gauze over wound, then place a dry gauze over the moist one, followed by Kerlix wrap, then ace wrap.

## 2015-07-21 NOTE — Clinical Social Work Placement (Signed)
   CLINICAL SOCIAL WORK PLACEMENT  NOTE  Date:  07/21/2015  Patient Details  Name: [name] MRN: [id number] Date of Birth: 01-15-1976  Clinical Social Work is seeking post-discharge placement for this patient at the [place] level of care (*CSW will initial, date and re-position this form in  chart as items are completed):  Yes   Patient/family provided with [place] Work Department's list of facilities offering this level of care within the geographic area requested by the patient (or if unable, by the patient's family).  Yes   Patient/family informed of their freedom to choose among providers that offer the needed level of care, that participate in Medicare, Medicaid or managed care program needed by the patient, have an available bed and are willing to accept the patient.  Yes   Patient/family informed of [facility]'s ownership interest in [facility] and [facility], as well as of the fact that they are under no obligation to receive care at these facilities.  PASRR submitted to EDS on       PASRR number received on       Existing PASRR number confirmed on       FL2 transmitted to all facilities in geographic area requested by pt/family on 07/20/15     FL2 transmitted to all facilities within larger geographic area on       Patient informed that his/her managed care company has contracts with or will negotiate with certain facilities, including the following:        Yes   Patient/family informed of bed offers received.  Patient chooses bed at  [facility] and [place])     Physician recommends and patient chooses bed at      Patient to be transferred to  [facility] and [place]) on 07/21/15.  Patient to be transferred to facility by  [facility]y [facility] )     Patient family notified on 07/21/15 of transfer.  Name of family member notified:   (Pt's husband, [name] )     PHYSICIAN Please sign FL2     Additional Comment:     _______________________________________________ Glendon Axe, MSW, LCSWA [phone] 07/21/2015 5:10 PM

## 2015-07-21 NOTE — Progress Notes (Signed)
Orthopaedic Trauma Service Progress Note  Subjective  Doing ok No new issues Working with PT  ROS As above  Objective   BP 123/58 mmHg  Pulse 102  Temp(Src) 98.4 F (36.9 C) (Oral)  Resp 16  Ht 5\' 2"  (1.575 m)  Wt 158.305 kg (349 lb)  BMI 63.82 kg/m2  SpO2 94%  LMP   Intake/Output      11/09 0701 - 11/10 0700 11/10 0701 - 11/11 0700   P.O. 720 240   I.V. (mL/kg)     IV Piggyback     Total Intake(mL/kg) 720 (4.5) 240 (1.5)   Urine (mL/kg/hr) 400 (0.1)    Blood     Total Output 400     Net +320 +240        Urine Occurrence 5 x 1 x     Labs  Results for Doutt, Jazaria L (MRN [id number]) as of 07/21/2015 13:50  Ref. Range 07/21/2015 08:00  WBC Latest Ref Range: 4.0-10.5 K/uL 8.1  RBC Latest Ref Range: 3.87-5.11 MIL/uL 4.13  Hemoglobin Latest Ref Range: 12.0-15.0 g/dL 11.3 (L)  HCT Latest Ref Range: 36.0-46.0 % 35.7 (L)  MCV Latest Ref Range: 78.0-100.0 fL 86.4  MCH Latest Ref Range: 26.0-34.0 pg 27.4  MCHC Latest Ref Range: 30.0-36.0 g/dL 31.7  RDW Latest Ref Range: 11.5-15.5 % 14.6  Platelets Latest Ref Range: 150-400 K/uL 369  Results for Mericle, Tanaia L (MRN [id number]) as of 07/21/2015 13:50  Ref. Range 07/20/2015 05:45  PTH Latest Ref Range: 15-65 pg/mL 49  Results for Sakuma, Nataline L (MRN [id number]) as of 07/21/2015 13:50  Ref. Range 07/18/2015 15:30  Vit D, 25-Hydroxy Latest Ref Range: 30.0-100.0 ng/mL 11.6 (L)    Exam  Gen: sitting in chair, NAD Ext:       Right Lower Extremity   Dressing c/d/i  Dressing removed, incision looks excellent  Well sealed, no drainage, no erythema              Distal motor and sensory functions intact             Ext warm             + DP pulse             No DCT               Compartments soft and NT    Assessment and Plan   POD/HD#: 2    39 y/o female with multiple medical comorbidities s/p ground level fall and R periprosthetic proximal tibia fracture  1.       A) R periprosthetic tibial plateau  fracture s/p ORIF             NWB x 8 weeks             Unrestricted ROM R knee             PT/OT             Ice and elevate as needed             Ok to shower with assistance                          Dressing changes as needed        B)   Chronic L ankle laxity               Requested pt obtain records from podiatrist who performed surgery on  her to determine exactly what was done             WBAT tolerated for now             Was not using bracing PTA  2. Pain management:             Continue with current management  3. ABL anemia/Hemodynamics             Stable  4. Medical issues               Per primary service             Tight sugar control to dec infection risk and inc chance of fx union   5. DVT/PE prophylaxis:             lovenox x 14 days post op   6. ID:               Peri-op abx  7. Metabolic Bone Disease:             Vitamin D deficiency                           Supplement              PTH is normal              Pt TSH elevated on admission, may need med adjustment  8. Activity:             See #1  9. FEN/Foley/Lines:             Carb mod diet   10. Impediments to fracture healing:             cushings             DM             Chronic neck/back pain (fall risk)             Vitamin d deficiency   11. Morbid obesity   12. Dispo:             ok for dc to snf from ortho standpoint  Follow up in 2 weeks with ortho     Jari Pigg, PA-C [facility] [phone] (O) 07/21/2015 1:50 PM

## 2015-07-21 NOTE — Clinical Social Work Note (Signed)
Patient has accepted bed offer from [facility] and [place].   CSW remains available as needed.  Glendon Axe, MSW, LCSWA [phone] 07/21/2015 1:14 PM

## 2015-07-21 NOTE — Progress Notes (Signed)
Physical Therapy Treatment Patient Details Name: [name] MRN: [id number] DOB: 1975/10/18 Today's Date: 07/21/2015    History of Present Illness 39 year old female who fell while out shopping and is now s/p ORIF Rt tibial plateau fracture diagnoses PMH: morbid obesity, Rt uni knee replacement, Lt ankle surgery, hypertension, dyslipidemia, hypothyroidism      PT Comments    Patient making gradual progress with PT. Able to work on static standing and sit/stand transfers from recliner. Patient limited by reports of fatigue. Anticipating patient will require SNF for further rehabilitation prior to returning home.   Follow Up Recommendations  SNF;Supervision for mobility/OOB     Equipment Recommendations  None recommended by PT    Recommendations for Other Services       Precautions / Restrictions Precautions Precautions: Fall Precaution Comments: cleared for full rt knee ROM Restrictions Weight Bearing Restrictions: Yes RLE Weight Bearing: Non weight bearing    Mobility  Bed Mobility               General bed mobility comments: up in recliner   Transfers Overall transfer level: Needs assistance Equipment used: Rolling walker (2 wheeled) Transfers: Sit to/from Stand Sit to Stand: Mod assist         General transfer comment: sit-stand transfers using rw and +2 mod assits, cues for NWB on Rt LE. Stood static X3 minutes (X1), 2 minutes (X1) with seated rest between.   Ambulation/Gait             General Gait Details: Unable to safety attempt ambulation at this time.    Stairs            Wheelchair Mobility    Modified Rankin (Stroke Patients Only)       Balance Overall balance assessment: Needs assistance Sitting-balance support: No upper extremity supported Sitting balance-Leahy Scale: Good     Standing balance support: Bilateral upper extremity supported Standing balance-Leahy Scale: Poor Standing balance comment: using rw                     Cognition Arousal/Alertness: Awake/alert Behavior During Therapy: WFL for tasks assessed/performed Overall Cognitive Status: Within Functional Limits for tasks assessed                      Exercises Total Joint Exercises Ankle Circles/Pumps: AROM;Both;15 reps Knee Flexion: Right;AAROM;10 reps;Seated    General Comments        Pertinent Vitals/Pain Pain Assessment: Faces (states can't really put a number on it. ) Faces Pain Scale: Hurts little more Pain Location: Rt knee Pain Descriptors / Indicators: Sore;Aching Pain Intervention(s): Limited activity within patient's tolerance;Monitored during session    Home Living                      Prior Function            PT Goals (current goals can now be found in the care plan section) Acute Rehab PT Goals Patient Stated Goal: leave today. PT Goal Formulation: With patient Time For Goal Achievement: 08/03/15 Potential to Achieve Goals: Good Progress towards PT goals: Progressing toward goals    Frequency  Min 3X/week    PT Plan Current plan remains appropriate    Co-evaluation             End of Session Equipment Utilized During Treatment: Gait belt Activity Tolerance: Patient limited by fatigue Patient left: in chair;with family/visitor present;with call bell/phone within  reach     Time: 1101-1126 PT Time Calculation (min) (ACUTE ONLY): 25 min  Charges:  $Therapeutic Activity: 23-37 mins                    G Codes:      Cassell Clement, PT, CSCS Pager [phone] Office [phone]  07/21/2015, 11:39 AM

## 2015-07-21 NOTE — Clinical Social Work Note (Signed)
Clinical Social Worker facilitated patient discharge including contacting patient family and facility to confirm patient discharge plans.  Clinical information faxed to facility and family agreeable with plan.  CSW arranged ambulance transport via [facility] to [facility] and [place] .  RN to call report prior to discharge.  D/C packet on chart.   Clinical Social Worker will sign off for now as social work intervention is no longer needed. Please consult [place] again if new need arises.  Glendon Axe, MSW, LCSWA [phone] 07/21/2015 5:11 PM

## 2015-07-21 NOTE — Discharge Summary (Signed)
Physician Discharge Summary  [name] [id number] DOB: Oct 02, 1975 DOA: 07/16/2015  PCP: Vladimir Creeks, MD  Admit date: 07/16/2015 Discharge date: 07/21/2015  Time spent: greater than 30 minutes  Recommendations for Outpatient Follow-up:  1. To SNF 2. Follow up with Dr. 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  X rays demonstrated right tibial plateau fracture. Transferred from [place] to [facility]    Assessment/Plan:     Fracture, tibial plateau - S/P mechanical fall S/p repair. Unrestricted ROM. NWB 8 weeks. Will need SNF   Major depressive disorder, recurrent episode, mild (HCC) - Continue Effexor   Hyperlipidemia - Continue gemfibrozil   Cushing disease ([place]) / Morbid obesity ([place])   Hypothyroid TSH above 6. Increased synthroid to 75 mcg daily    Uncontrolled diabetes mellitus with diabetic neuropathy, with long-term current use of insulin (HCC) - A1c 7 - Continue insulin 25 units BID (reduced to  half her dose due to frequent hypoglycemic episodes) - We will continue invokana - We will continue gabapentin 600 mg PO BID for diabetic neuropathy    Leukocytosis - Likely reactive - No obvious source of infection   Essential hypertension - Continue lisinopril 5 mg daily   DVT prophylaxis:  enoxaparin       Procedures:  ORIF right tibial plateua fractrue  Consultations:  Orthopedics, Handy  Discharge Exam: Filed Vitals:   07/21/15 0504  BP: 123/58  Pulse: 102  Temp: 98.4 F (36.9 C)  Resp: 16    General: a and o Cardiovascular: RRR Respiratory: CTA Ext: splint  Discharge Instructions   Discharge Instructions    Diet - low sodium heart healthy    Complete by:  As directed      Diet Carb Modified    Complete by:  As directed           Current Discharge Medication List    START taking these medications   Details  acetaminophen (TYLENOL) 325 MG tablet Take 2 tablets (650 mg total) by mouth every 6 (six) hours as needed for mild pain (or Fever >/= 101).    cholecalciferol 1000 UNITS tablet Take 1 tablet (1,000 Units total) by mouth 2 (two) times daily.    enoxaparin (LOVENOX) 80 MG/0.8ML injection Inject 0.8 mLs (80 mg total) into the skin daily. X 3 weeks Qty: 0 Syringe    HYDROcodone-acetaminophen (NORCO) 7.5-325 MG tablet Take 1-2 tablets by mouth every 6 (six) hours as needed for moderate pain or severe pain. Qty: 30 tablet, Refills: 0    vitamin C (VITAMIN C) 500 MG tablet Take 1  tablet (500 mg total) by mouth daily.    Vitamin D, Ergocalciferol, (DRISDOL) 50000 UNITS CAPS capsule Take 1 capsule (50,000 Units total) by mouth every 7 (seven) days. Qty: 30 capsule      CONTINUE these medications which have CHANGED   Details  insulin NPH-regular Human (NOVOLIN 70/30) (70-30) 100 UNIT/ML injection Inject 28 Units into the skin 2 (two) times daily with a meal. Qty: 10 mL, Refills: 11    levothyroxine (SYNTHROID, LEVOTHROID) 75 MCG tablet Take  1 tablet (75 mcg total) by mouth every morning.    zolpidem (AMBIEN CR) 6.25 MG CR tablet Take 1 tablet (6.25 mg total) by mouth at bedtime as needed for sleep. Qty: 20 tablet, Refills: 0      CONTINUE these medications which have NOT CHANGED   Details  canagliflozin (INVOKANA) 300 MG TABS tablet Take 300 mg by mouth daily before breakfast.    Cetirizine HCl (ZYRTEC ALLERGY) 10 MG CAPS Take 1 capsule (10 mg total) by mouth daily. Qty: 30 capsule, Refills: 1    cholestyramine (QUESTRAN) 4 G packet Take 1 packet (4 g total) by mouth 2 (two) times daily with a meal. Qty: 60 each, Refills: 12    fluticasone (FLONASE) 50 MCG/ACT nasal spray 1 spray each nares bid Qty: 10 g, Refills: 1    gabapentin (NEURONTIN) 300 MG capsule Take 600 mg by mouth 2 (two) times daily.     gemfibrozil (LOPID) 600 MG tablet Take 600 mg by mouth 2 (two) times daily before a meal.    hydrOXYzine (VISTARIL) 50 MG capsule Take 100 mg by mouth at bedtime. sleep    Ibuprofen (ADVIL MIGRAINE) 200 MG CAPS Take 400 mg by mouth 2 (two) times daily as needed (pain).    quinapril (ACCUPRIL) 5 MG tablet Take 2.5 mg by mouth every morning.     venlafaxine XR (EFFEXOR-XR) 37.5 MG 24 hr capsule Take 37.5-75 mg by mouth 2 (two) times daily. Take 2 capsules= 75mg  in the morning and take 1 capsule = 37.5mg  at bedtime      STOP taking these medications     clindamycin (CLEOCIN) 300 MG capsule      Zolpidem Tartrate (AMBIEN PO)        Allergies  Allergen Reactions  . Penicillins Hives    Has patient had a PCN reaction causing immediate rash, facial/tongue/throat swelling, SOB or lightheadedness with hypotension: Yes Has patient had a PCN reaction causing severe rash involving mucus membranes or skin necrosis: Yes Has patient had a PCN reaction that required hospitalization No Has patient had a PCN reaction occurring within the last 10 years: No If all of the above answers are "NO", then may proceed with  Cephalosporin use.    Follow-up Information    Follow up with HANDY,MICHAEL H, MD In 1 week.   Specialty:  Orthopedic Surgery   Contact information:   [address] [place] [address] [phone]        The results of significant diagnostics from this hospitalization (including imaging, microbiology, ancillary and laboratory) are listed below for reference.    Significant Diagnostic Studies: Dg Knee Complete 4 Views Right  07/19/2015  CLINICAL DATA:  Surgical internal fixation of the right tibial plateau. 27 seconds of fluoroscopy time provided for the procedure. EXAM: RIGHT KNEE - COMPLETE 4+ VIEW; DG C-ARM 61-120 MIN COMPARISON:  Plain film examination of the right knee dated 07/16/2015. FINDINGS: Four intraoperative fluoroscopic images are provided showing plate and screw  fixation of the proximal right tibia fracture. Hardware appears intact and well positioned. Previous hemiarthroplasty hardware appears well positioned. No surgical complicating feature identified. IMPRESSION: Intraoperative fluoroscopic images showing plate and screw fixation of the tibial plateau/proximal tibia fracture. 27 seconds of fluoroscopy time provided for the procedure. Electronically Signed   By: Franki Cabot M.D.   On: 07/19/2015 11:14   Dg Knee Complete 4 Views Right  07/16/2015  CLINICAL DATA:  Right knee pain after tripping over her foot and falling today. Status post partial right knee replacement this year. EXAM: RIGHT KNEE - COMPLETE 4+ VIEW COMPARISON:  None. FINDINGS: Comminuted fracture involving the medial and lateral tibial plateaus with extension into the central joint space. There is also a medial knee prosthesis. Also noted is a nondisplaced transverse fracture of the fibular head. Probable small effusion. Minimal posterior patellar spur formation. IMPRESSION: 1. Essentially nondisplaced comminuted fracture of the medial and lateral tibial plateaus with intra-articular extension  centrally. 2. Nondisplaced fibular head fracture. 3. Minimal patellofemoral degenerative change. 4. Medial knee prosthesis. Electronically Signed   By: Claudie Revering M.D.   On: 07/16/2015 15:26   Dg Knee Right Port  07/19/2015  CLINICAL DATA:  Status post fracture fixation. EXAM: PORTABLE RIGHT KNEE - 1-2 VIEW COMPARISON:  Earlier films, same date. FINDINGS: Medial sideplate and multiple screws transfixing the tibia fracture with anatomic alignment. Stable fibular head fracture. Stable appearance of the medial compartment prosthesis. IMPRESSION: Internal fixation and anatomic reduction of tibia fracture. Electronically Signed   By: Marijo Sanes M.D.   On: 07/19/2015 20:52   Dg Ankle Left Port  07/18/2015  CLINICAL DATA:  Pt tripped and fell at [place] and complains of pain in the left ankle. Pt states that she had recurring sprains and had screws placed in the left ankle in March for added support. EXAM: PORTABLE LEFT ANKLE - 2 VIEW COMPARISON:  Left ankle MRI, 11/25/2010 FINDINGS: No acute fracture. Ankle mortise is normally spaced and aligned. There is a small area of irregularity along the subchondral aspect of the lateral talar dome, not present on the prior MRI. This could be degenerative or reflect a small osteochondral lesion. There is a small well corticated bone density inferior to the medial malleolus which may be due to an old avulsion injury. This is not appear acute. Vascular calcifications are noted along the dorsalis pedis. Soft tissues are otherwise unremarkable. IMPRESSION: 1. No acute fracture or dislocation. 2. Possible small osteochondral lesion along the lateral talar dome. Electronically Signed   By: Lajean Manes M.D.   On: 07/18/2015 15:41   Dg C-arm 61-120 Min  07/19/2015  CLINICAL DATA:  Surgical internal fixation of the right tibial plateau. 27 seconds of fluoroscopy time provided for the procedure. EXAM: RIGHT KNEE - COMPLETE 4+ VIEW; DG C-ARM 61-120 MIN COMPARISON:  Plain  film examination of the right knee dated 07/16/2015. FINDINGS: Four intraoperative fluoroscopic images are provided showing plate and screw fixation of the proximal right tibia fracture. Hardware appears intact and well positioned. Previous hemiarthroplasty hardware appears well positioned. No surgical complicating feature identified. IMPRESSION: Intraoperative fluoroscopic images showing plate and screw fixation of the tibial plateau/proximal tibia fracture. 27 seconds of fluoroscopy time provided for the procedure. Electronically Signed   By: Franki Cabot M.D.   On: 07/19/2015 11:14    Microbiology: Recent Results (from the past 240 hour(s))  Surgical pcr screen     Status: Abnormal   Collection Time: 07/18/15 10:17 PM  Result Value  Ref Range Status   MRSA, PCR NEGATIVE NEGATIVE Final   Staphylococcus aureus POSITIVE (A) NEGATIVE Final    Comment:        The Xpert SA Assay (FDA approved for NASAL specimens in patients over 37 years of age), is one component of a comprehensive surveillance program.  Test performance has been validated by [facility] for patients greater than or equal to 15 year old. It is not intended to diagnose infection nor to guide or monitor treatment.   Surgical pcr screen     Status: Abnormal   Collection Time: 07/19/15 12:34 AM  Result Value Ref Range Status   MRSA, PCR NEGATIVE NEGATIVE Final   Staphylococcus aureus POSITIVE (A) NEGATIVE Final    Comment:        The Xpert SA Assay (FDA approved for NASAL specimens in patients over 23 years of age), is one component of a comprehensive surveillance program.  Test performance has been validated by [facility] for patients greater than or equal to 28 year old. It is not intended to diagnose infection nor to guide or monitor treatment.      Labs: Basic Metabolic Panel:  Recent Labs Lab 07/16/15 1727 07/16/15 1950 07/17/15 0440 07/18/15 1530 07/20/15 0545  NA 139 139 138 138 136  K 4.0 4.1  4.7 4.4 4.8  CL 103 104 102 104 103  CO2 27 27 28 28  20*  GLUCOSE 57* 71 138* 149* 138*  BUN 12 12 12 16 13   CREATININE 0.64 0.67 0.69 0.69 0.61  CALCIUM 9.0 9.1 8.8* 9.1 8.8*  8.6*  MG  --  1.8  --   --  2.0  PHOS  --  4.6  --   --  2.7   Liver Function Tests:  Recent Labs Lab 07/16/15 1950 07/17/15 0440 07/18/15 1530 07/20/15 0545  AST 38 37 17 18  ALT 25 33 25 23  ALKPHOS 97 104 83 76  BILITOT 0.4 0.7 0.6 0.6  PROT 7.1 6.9 7.2 6.5  ALBUMIN 3.3* 3.3* 3.1* 2.8*   No results for input(s): LIPASE, AMYLASE in the last 168 hours. No results for input(s): AMMONIA in the last 168 hours. CBC:  Recent Labs Lab 07/16/15 1727 07/16/15 1950 07/17/15 0440 07/18/15 1530 07/20/15 0545 07/21/15 0800  WBC 12.3* 11.8* 10.2 8.5 9.6 8.1  NEUTROABS 10.0* 9.6*  --   --   --   --   HGB 12.9 12.7 12.2 11.9* 11.2* 11.3*  HCT 40.0 39.6 39.3 37.7 35.0* 35.7*  MCV 85.3 85.5 86.6 86.3 85.4 86.4  PLT 360 373 364 364 328 369   Cardiac Enzymes: No results for input(s): CKTOTAL, CKMB, CKMBINDEX, TROPONINI in the last 168 hours. BNP: BNP (last 3 results) No results for input(s): BNP in the last 8760 hours.  ProBNP (last 3 results) No results for input(s): PROBNP in the last 8760 hours.  CBG:  Recent Labs Lab 07/20/15 1102 07/20/15 1613 07/20/15 2157 07/21/15 0633 07/21/15 1138  GLUCAP 131* 185* 153* 124* 112*       Signed:  Toini Failla L  [facility] 07/21/2015, 11:52 AM

## 2015-07-22 ENCOUNTER — Encounter: Payer: Self-pay | Admitting: Adult Health

## 2015-07-22 ENCOUNTER — Non-Acute Institutional Stay (SKILLED_NURSING_FACILITY): Payer: 59 | Admitting: Adult Health

## 2015-07-22 DIAGNOSIS — I10 Essential (primary) hypertension: Secondary | ICD-10-CM

## 2015-07-22 DIAGNOSIS — E240 Pituitary-dependent Cushing's disease: Secondary | ICD-10-CM | POA: Diagnosis not present

## 2015-07-22 DIAGNOSIS — E781 Pure hyperglyceridemia: Secondary | ICD-10-CM

## 2015-07-22 DIAGNOSIS — E0865 Diabetes mellitus due to underlying condition with hyperglycemia: Secondary | ICD-10-CM

## 2015-07-22 DIAGNOSIS — E559 Vitamin D deficiency, unspecified: Secondary | ICD-10-CM | POA: Diagnosis not present

## 2015-07-22 DIAGNOSIS — Z794 Long term (current) use of insulin: Secondary | ICD-10-CM | POA: Diagnosis not present

## 2015-07-22 DIAGNOSIS — IMO0002 Reserved for concepts with insufficient information to code with codable children: Secondary | ICD-10-CM

## 2015-07-22 DIAGNOSIS — E0840 Diabetes mellitus due to underlying condition with diabetic neuropathy, unspecified: Secondary | ICD-10-CM

## 2015-07-22 DIAGNOSIS — E038 Other specified hypothyroidism: Secondary | ICD-10-CM

## 2015-07-22 DIAGNOSIS — F330 Major depressive disorder, recurrent, mild: Secondary | ICD-10-CM

## 2015-07-22 DIAGNOSIS — M8080XA Other osteoporosis with current pathological fracture, unspecified site, initial encounter for fracture: Secondary | ICD-10-CM

## 2015-07-22 LAB — VITAMIN D 1,25 DIHYDROXY
VITAMIN D 1, 25 (OH) TOTAL: 30 pg/mL
VITAMIN D3 1, 25 (OH): 30 pg/mL
Vitamin D2 1, 25 (OH)2: <10 pg/mL

## 2015-07-22 NOTE — Progress Notes (Signed)
Patient ID: [name], female   DOB: 1975/09/20, 39 y.o.   MRN: [id number]    Facility: [facility]       Allergies  Allergen Reactions  . Penicillins Hives    Has patient had a PCN reaction causing immediate rash, facial/tongue/throat swelling, SOB or lightheadedness with hypotension: Yes Has patient had a PCN reaction causing severe rash involving mucus membranes or skin necrosis: Yes Has patient had a PCN reaction that required hospitalization No Has patient had a PCN reaction occurring within the last 10 years: No If all of the above answers are "NO", then may proceed with Cephalosporin use.     Chief Complaint  Patient presents with  . Hospitalization Follow-up    HPI:  She had a fall while shopping and suffered a right tibial plateau fracture with ORIF. She is here for short term rehab; she is non-weight bearing for 8 weeks. Her pain is being poorly managed. Her goal is to return home.    Past Medical History  Diagnosis Date  . Hyperlipidemia   . Cushing's disease ([place])   . Morbid obesity ([place])   . Diabetes ([place])   . Hyperlipidemia   . Hypertension   . 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 Vitamin D deficiency 07/19/2015  . Osteoporosis 07/19/2015    Past Surgical History  Procedure Laterality Date  . Cesarean section  dec 1997/  06-03-2001/   01-01-2005    BILATERAL TUBAL LIGATION WITH LAST ONE  . Partial knee arthroplasty Right 04/19/2014    Procedure: RIGHT UNI KNEE ARTHROPLASTY MEDIALLY ;  Surgeon: Mauri Pole, MD;  Location: [facility];  Service: Orthopedics;  Laterality: Right;  . Laparoscopic cholecystectomy  09-25-2005  . Dilation and curettage of uterus  1995    WITH SUCTION  . Anterior talofibular ligament repair Left 11/15/2014    Procedure: ANTERIOR TALOFIBULAR LIGAMENT REPAIR;  Surgeon: Jana Half, DPM;  Location: [facility];  Service: Podiatry;  Laterality: Left;  .  Orif tibia plateau Right 07/19/2015    Procedure: OPEN REDUCTION INTERNAL FIXATION (ORIF) RIGHT TIBIAL PLATEAU;  Surgeon: Altamese [place], MD;  Location: [facility];  Service: Orthopedics;  Laterality: Right;    VITAL SIGNS BP 127/68 mmHg  Pulse 93  Ht 5\' 2"  (1.575 m)  Wt 349 lb (158.305 kg)  BMI 63.82 kg/m2  Patient's Medications  New Prescriptions   No medications on file  Previous Medications   ACETAMINOPHEN (TYLENOL) 325 MG TABLET    Take 2 tablets (650 mg total) by mouth every 6 (six) hours as needed for mild pain (or Fever >/= 101).   CANAGLIFLOZIN (INVOKANA) 300 MG TABS TABLET    Take 300 mg by mouth daily before breakfast.   CETIRIZINE HCL (ZYRTEC ALLERGY) 10 MG CAPS    Take 1 capsule (10 mg total) by mouth daily.   CHOLECALCIFEROL 1000 UNITS TABLET    Take 1 tablet (1,000 Units total) by mouth 2 (two) times daily.   CHOLESTYRAMINE (QUESTRAN) 4 G PACKET    Take 1 packet (4 g total) by mouth 2 (two) times daily with a meal.   ENOXAPARIN (LOVENOX) 80 MG/0.8ML INJECTION    Inject 0.8 mLs (80 mg total) into the skin daily. X 3 weeks   FLUTICASONE (FLONASE) 50 MCG/ACT NASAL SPRAY    1 spray each nares bid   GABAPENTIN (NEURONTIN) 300 MG CAPSULE    Take 600 mg by mouth 2 (two) times  daily.    GEMFIBROZIL (LOPID) 600 MG TABLET    Take 600 mg by mouth 2 (two) times daily before a meal.   HYDROCODONE-ACETAMINOPHEN (NORCO) 7.5-325 MG TABLET    Take 1-2 tablets by mouth every 6 (six) hours as needed for moderate pain or severe pain.   HYDROXYZINE (VISTARIL) 50 MG CAPSULE    Take 100 mg by mouth at bedtime. sleep   INSULIN NPH-REGULAR HUMAN (NOVOLIN 70/30) (70-30) 100 UNIT/ML INJECTION    Inject 28 Units into the skin 2 (two) times daily with a meal.   LEVOTHYROXINE (SYNTHROID, LEVOTHROID) 75 MCG TABLET    Take 1 tablet (75 mcg total) by mouth every morning.   QUINAPRIL (ACCUPRIL) 5 MG TABLET    Take 2.5 mg by mouth every morning.    VENLAFAXINE XR (EFFEXOR-XR) 37.5 MG 24 HR CAPSULE    Take 37.5-75  mg by mouth 2 (two) times daily. Take 2 capsules= 75mg  in the morning and take 1 capsule = 37.5mg  at bedtime   VITAMIN C (VITAMIN C) 500 MG TABLET    Take 1 tablet (500 mg total) by mouth daily.   VITAMIN D, ERGOCALCIFEROL, (DRISDOL) 50000 UNITS CAPS CAPSULE    Take 1 capsule (50,000 Units total) by mouth every 7 (seven) days.   ZOLPIDEM (AMBIEN CR) 6.25 MG CR TABLET    Take 1 tablet (6.25 mg total) by mouth at bedtime as needed for sleep.  Modified Medications   No medications on file  Discontinued Medications   IBUPROFEN (ADVIL MIGRAINE) 200 MG CAPS    Take 400 mg by mouth 2 (two) times daily as needed (pain).     SIGNIFICANT DIAGNOSTIC EXAMS  07-16-15: right knee x-ray: 1. Essentially nondisplaced comminuted fracture of the medial and lateral tibial plateaus with intra-articular extension centrally. 2. Nondisplaced fibular head fracture. 3. Minimal patellofemoral degenerative change. 4. Medial knee prosthesis.  07-18-15: left ankle: 1. No acute fracture or dislocation. 2. Possible small osteochondral lesion along the lateral talar dome.   07-19-15: right x-ray: Internal fixation and anatomic reduction of tibia fracture    LABS REVIEWED:   07-16-15: wbc 12.;3 hgb 12.9; hct 40.0; mcv 85.3; plt 360; glucose 57; bun 12; creat 0.64; k+ 4.4; na++138; liver normal albumin 3.3 mag 1.8; phos 4.6; hgb a1c 7.0; tsh 6.272 07-18-15: wbc 8.5; hgb 11.9; hct 37.7; mcv 86.3; plt 364; glucose 149; bun 16; creat 0.69; k+ 4.4; na++138; liver normal albumin 3.1; vit d 11.6 07-20-15: wbc 9.6 hgb 11.2; hct 35.0; mcv 85.4; ;plt 328; glucose 138; bun 13; creat 0.61; k+ 4.8; na++136; liver normal albumin 2.8; pre-albumin 19.7; pth 49; ca 8.6   Review of Systems  Constitutional: Negative for malaise/fatigue.  Respiratory: Negative for cough and shortness of breath.   Cardiovascular: Negative for chest pain, palpitations and leg swelling.  Gastrointestinal: Negative for heartburn, abdominal pain and  constipation.  Musculoskeletal: Positive for joint pain. Negative for myalgias.       Right knee pain   Skin: Negative.   Psychiatric/Behavioral: Negative for depression. The patient is not nervous/anxious.      Physical Exam  Constitutional: She is oriented to person, place, and time. No distress.  Morbidly obese   Eyes: Conjunctivae are normal.  Neck: Neck supple. No JVD present. No thyromegaly present.  Cardiovascular: Normal rate, regular rhythm and intact distal pulses.   Respiratory: Effort normal and breath sounds normal. No respiratory distress. She has no wheezes.  GI: Soft. Bowel sounds are normal. She exhibits no distension.  There is no tenderness.  Musculoskeletal: She exhibits no edema.  Able to move all extremities  Is non-weight bearing to right leg Is status post right tibial plateau fracture   Lymphadenopathy:    She has no cervical adenopathy.  Neurological: She is alert and oriented to person, place, and time.  Skin: Skin is warm and dry. She is not diaphoretic.  Psychiatric: She has a normal mood and affect.        ASSESSMENT/ PLAN:  1. Right tibial plateau fracture; will continue therapy as directed; will follow up with orthopedics as indicated. Will vicodin to 7.5/325 mg every 6 hours routinely and every 3 hours as needed  And will continue loxenox 80 mg daily for 3 weeks and will continue to monitor her status.   2. Vit d deficiency: will continue vitamin d 50,000 units weekly and 1000 units twice daily her vit d level was 11.6   3. Diabetes will continue invokana 300 mg daily NPH 28 units twice daily  hgb a1c 7.0  4. Hypothyroidism: will continue synthroid 75 mcg daily will check tsh in one month tsh is 6.272   5. Hypertension:  Will continue accupril 2.5 mg daily   6. Hypertriglyceridemia: will continue lopid 600 mg twice daily  7. Major depression: will continue effexor xr 75 mg in the am and 37.5 mg in the pm; will continue to monitor  8.   Insomnia; will continue ambien cr 6.25 mg nightly prn and hydroxyzine 100 mg nightly   9. peripheral neuropathy: will continue neurontin 600 mg twice daily   10. Allergic rhinitis: will continue zyrtec 10 mg daily as needed; flonase twice daily   11. Morbid obesity/cushing disease: no change in status will monitor  12. Obstructive sleep apnea will use cpap at [place]     Time spent with patient 50   minutes >50% time spent counseling; reviewing medical record; tests; labs; and developing future plan of care      Ok Edwards NP [facility]  Contact [phone] Monday through Friday 8am- 5pm  After hours call [phone]

## 2015-07-25 NOTE — Op Note (Signed)
[name], [name]                ACCOUNT NO.:  [id number]  MEDICAL RECORD NO.:  [id number]  LOCATION:  [id number]                        FACILITY:  [facility]  PHYSICIAN:  Astrid Divine. Marcelino Scot, M.D. DATE OF BIRTH:  Feb 12, 1976  DATE OF PROCEDURE:  07/19/2015 DATE OF DISCHARGE:  07/21/2015                              OPERATIVE REPORT   PREOPERATIVE DIAGNOSES: 1. Right bicondylar periprosthetic tibial plateau fracture. 2. Right fibular head fracture.  POSTOPERATIVE DIAGNOSES: 1. Right bicondylar periprosthetic tibial plateau fracture. 2. Right fibular head fracture.  PROCEDURES: 1. Open reduction and internal fixation of right bicondylar tibial     plateau. 2. Stress fluoroscopy of the knee. 3. Closed treatment fibular head fracture.  SURGEON:  Astrid Divine. Marcelino Scot, M.D.  ASSISTANT:  Jari Pigg, PA-C.  ANESTHESIA:  General.  COMPLICATIONS:  None.  TOURNIQUET:  None.  DISPOSITION:  To PACU.  CONDITION:  Stable.  BRIEF SUMMARY AND INDICATION FOR PROCEDURE:  [name] is a 39 year old female with Cushing's and a host of other medical issues who underwent partial knee replacement, had sustained a ground level fall in a depot on July 16, 2015.  This was immediately below her tibial base plate for the intercondylar replacement.  Dr. Alvan Dame was contacted by the admitting medical physician as he had performed a replacement.  As a fellowship trained joint replacement surgeon who also did general orthopedics, Dr. Alvan Dame found this injury because of its location and extent to be outside his scope of practice.  I believe this to be best managed by fellowship trained orthopedic traumatologist.  Consequently, I was consulted.  I evaluated [name]'s injury, reviewed her plain films and CT scan and recommended internal fixation with a medial plate to secure both sides of the tibial plateau and then to stress the fibular head and see if any further fixation were required  after buttressing the medial side of the plate.  I discussed the possible risk including infection, nerve injury, vessel injury, DVT, PE, heart attack, stroke, wound infection, and need for removal of the plate prior to conversion to a total knee arthroplasty in the event that there is failure to unite or malalignment resulting in the need for revision.  We also discussed heart attack, stroke, and other anesthetic complications. The patient acknowledged these risks and strongly wished to proceed.  BRIEF SUMMARY OF PROCEDURE:  The patient was given Ancef preoperatively and taken to the operating room where general anesthesia was induced. Her left lower extremity was prepped and draped in the usual sterile fashion.  I was unable to use a tourniquet because of her super obesity with a weight of 350 pounds.  This also may lead dissection and plate placement much more difficult.  Instead of using Dr. Aurea Graff anterior incision, I did have to make a separate medial incision.  C-arm was brought in to establish the exact optimal place for position. Dissection was carried deep down to the bone where the hamstring tendons were mobilized to expose the medial cortex.  My assistant then pulled maximal traction and then reduced the fracture with regard to its coronal alignment.  Once this was achieved, I placed provisional fixation with K-wires and pin  fixation, checked plate and reduction with orthogonal views on C-arm and proceeded with standard fixation followed by some locked fixation.  At that point, we evaluated the fibular head fracture and need not appeared to open to stress and consequently decision was made to treat the fibular head fracture closed.  All images appeared to show the plate and screws in appropriate position, trajectory, and length with restoration of reduction and alignment. Wound was irrigated thoroughly and closed in standard layered fashion using 0 Vicryl, 2-0 Vicryl, and  nylon.  Sterile gently compressive dressing was applied from foot to thigh.  The patient was then awakened from anesthesia and transported to PACU in stable condition.  [name] Spinner, PA-C assisted me throughout and again an assistant was absolutely necessary for the safe and effective completion.  PROGNOSIS:  [name] remains at significantly elevated risk for multiple complications given her comorbidities.  Her leg because of its shape and contour is not amenable to bracing and this further put stress on the operative repair.  We would anticipate a 2-3 month for union with graduated weightbearing after 8-10 weeks and then resumption of followup with Dr. Alvan Dame ultimately for a close watch of the replacement.  She will be on formal pharmacologic DVT prophylaxis.     Astrid Divine. Marcelino Scot, M.D.     MHH/MEDQ  D:  07/22/2015  T:  07/23/2015  Job:  [id number]

## 2015-07-26 ENCOUNTER — Non-Acute Institutional Stay (SKILLED_NURSING_FACILITY): Payer: 59 | Admitting: Internal Medicine

## 2015-07-26 DIAGNOSIS — E038 Other specified hypothyroidism: Secondary | ICD-10-CM | POA: Diagnosis not present

## 2015-07-26 DIAGNOSIS — M8080XA Other osteoporosis with current pathological fracture, unspecified site, initial encounter for fracture: Secondary | ICD-10-CM

## 2015-07-26 DIAGNOSIS — Z794 Long term (current) use of insulin: Secondary | ICD-10-CM | POA: Diagnosis not present

## 2015-07-26 DIAGNOSIS — E0840 Diabetes mellitus due to underlying condition with diabetic neuropathy, unspecified: Secondary | ICD-10-CM

## 2015-07-26 DIAGNOSIS — E781 Pure hyperglyceridemia: Secondary | ICD-10-CM | POA: Diagnosis not present

## 2015-07-26 DIAGNOSIS — E0865 Diabetes mellitus due to underlying condition with hyperglycemia: Secondary | ICD-10-CM

## 2015-07-26 DIAGNOSIS — F330 Major depressive disorder, recurrent, mild: Secondary | ICD-10-CM

## 2015-07-26 DIAGNOSIS — E240 Pituitary-dependent Cushing's disease: Secondary | ICD-10-CM | POA: Diagnosis not present

## 2015-07-26 DIAGNOSIS — E559 Vitamin D deficiency, unspecified: Secondary | ICD-10-CM | POA: Diagnosis not present

## 2015-07-26 DIAGNOSIS — I10 Essential (primary) hypertension: Secondary | ICD-10-CM

## 2015-07-26 DIAGNOSIS — IMO0002 Reserved for concepts with insufficient information to code with codable children: Secondary | ICD-10-CM

## 2015-07-27 ENCOUNTER — Encounter: Payer: Self-pay | Admitting: Internal Medicine

## 2015-07-27 NOTE — Progress Notes (Signed)
Patient ID: [name], female   DOB: [birth year], 39 y.o.   MRN: [id number]    HISTORY AND PHYSICAL   DATE: 07/26/15  Location:  [facility]    Place of Service: SNF [phone])   Extended Emergency Contact Information Primary Emergency Contact: [facility] Address: [address]          [place], [place] [address] Johnnette Litter of [place] Phone: [phone] Mobile Phone: [phone] Relation: Spouse  Advanced Directive information    Chief Complaint  Patient presents with  . New Admit To SNF    HPI:  39 yo female seen today as a new admission into SNF following hospital stay for right tibial plateau fx s/p ORIF, osteoporosis. She underwent right leg ORIF falling fx sustained after fall. She was told she would need NWB x 8 weeks. Her TSH >6 and synthroid dose increased. lovenox used for DVT prophylaxis. She presents to SNF for short term rehab.  She reports pain well controlled on norco. No concerns. No falls. No nursing issues. She is a poor historian due to MDD. Hx obtained from chart.  Vit d deficiency - takes vitamin d 50,000 units weekly and 1000 units twice daily. Vit D 25OH level 11.1  DM - controlled on invokana 300 mg daily, NPH 28 units twice daily. A1c 7%. She has neuropathy and takes neurontin 600 mg twice daily   Hypothyroidism - TSH 6.272.  synthroid increased to 75 mcg daily prior to d/c.  Hypertension - BP stable on accupril 2.5 mg daily   Hypertriglyceridemia - stable on lopid 600 mg twice daily  MDD - mood stable on effexor xr 75 mg in the am and 37.5 mg in the pm;   Insomnia - stable on ambien cr 6.25 mg nightly prn and hydroxyzine 100 mg nightly   Allergic rhinitis - stable on zyrtec 10 mg daily as needed; flonase twice daily   Morbid obesity/cushing disease - stable. She does not take any meds  Obstructive sleep apnea - stable on qHS CPAP  Past Medical History  Diagnosis Date  . Hyperlipidemia   . Cushing's disease ([place])    . Morbid obesity ([place])   . Diabetes ([place])   . Hyperlipidemia   . Hypertension   . Periprosthetic fracture 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[place]), R tibial plateau  07/18/2015  . Vitamin D deficiency 07/19/2015  . Osteoporosis 07/19/2015  . Uncontrolled diabetes mellitus with diabetic neuropathy, with long-term current use of insulin ([place]) 07/16/2015    Past Surgical History  Procedure Laterality Date  . Cesarean section  dec 1997/  06-03-2001/   01-01-2005    BILATERAL TUBAL LIGATION WITH LAST ONE  . Partial knee arthroplasty Right 04/19/2014    Procedure: RIGHT UNI KNEE ARTHROPLASTY MEDIALLY ;  Surgeon: Mauri Pole, MD;  Location: [facility];  Service: Orthopedics;  Laterality: Right;  . Laparoscopic cholecystectomy  09-25-2005  . Dilation and curettage of uterus  1995    WITH SUCTION  . Anterior talofibular ligament repair Left 11/15/2014    Procedure: ANTERIOR TALOFIBULAR LIGAMENT REPAIR;  Surgeon: Jana Half, DPM;  Location: [facility];  Service: Podiatry;  Laterality: Left;  . Orif tibia plateau Right 07/19/2015    Procedure: OPEN REDUCTION INTERNAL FIXATION (ORIF) RIGHT TIBIAL PLATEAU;  Surgeon: Altamese Greasewood, MD;  Location: [facility];  Service: Orthopedics;  Laterality: Right;    Patient Care Team: Vladimir Creeks, MD as PCP - General (Internal Medicine) Provider Not In System  Social  History   Social History  . Marital Status: Married    Spouse Name: N/A  . Number of Children: 2  . Years of Education: N/A   Occupational History  . Not on file.   Social History Main Topics  . Smoking status: Former Smoker -- 4 years    Types: Cigarettes    Quit date: 07/25/1999  . Smokeless tobacco: Never Used  . Alcohol Use: Yes     Comment: RARE  . Drug Use: No  . Sexual Activity: Not on file   Other Topics Concern  . Not on file   Social History Narrative     reports that she quit smoking about 16 years ago. Her smoking use included Cigarettes. She quit  after 4 years of use. She has never used smokeless tobacco. She reports that she drinks alcohol. She reports that she does not use illicit drugs.  Family History  Problem Relation Age of Onset  . Adopted: Yes  . Autism Son   . ADD / ADHD Son   . Apraxia Son    Family Status  Relation Status Death Age  . Son Alive   . Daughter Alive     Immunization History  Administered Date(s) Administered  . Influenza,inj,Quad PF,36+ Mos 07/21/2015  . Pneumococcal Polysaccharide-23 07/21/2015    Allergies  Allergen Reactions  . Penicillins Hives    Has patient had a PCN reaction causing immediate rash, facial/tongue/throat swelling, SOB or lightheadedness with hypotension: Yes Has patient had a PCN reaction causing severe rash involving mucus membranes or skin necrosis: Yes Has patient had a PCN reaction that required hospitalization No Has patient had a PCN reaction occurring within the last 10 years: No If all of the above answers are "NO", then may proceed with Cephalosporin use.     Medications: Patient's Medications  New Prescriptions   No medications on file  Previous Medications   ACETAMINOPHEN (TYLENOL) 325 MG TABLET    Take 2 tablets (650 mg total) by mouth every 6 (six) hours as needed for mild pain (or Fever >/= 101).   CANAGLIFLOZIN (INVOKANA) 300 MG TABS TABLET    Take 300 mg by mouth daily before breakfast.   CETIRIZINE HCL (ZYRTEC ALLERGY) 10 MG CAPS    Take 1 capsule (10 mg total) by mouth daily.   CHOLECALCIFEROL 1000 UNITS TABLET    Take 1 tablet (1,000 Units total) by mouth 2 (two) times daily.   CHOLESTYRAMINE (QUESTRAN) 4 G PACKET    Take 1 packet (4 g total) by mouth 2 (two) times daily with a meal.   ENOXAPARIN (LOVENOX) 80 MG/0.8ML INJECTION    Inject 0.8 mLs (80 mg total) into the skin daily. X 3 weeks   FLUTICASONE (FLONASE) 50 MCG/ACT NASAL SPRAY    1 spray each nares bid   GABAPENTIN (NEURONTIN) 300 MG CAPSULE    Take 600 mg by mouth 2 (two) times daily.     GEMFIBROZIL (LOPID) 600 MG TABLET    Take 600 mg by mouth 2 (two) times daily before a meal.   HYDROCODONE-ACETAMINOPHEN (NORCO) 7.5-325 MG TABLET    Take 1-2 tablets by mouth every 6 (six) hours as needed for moderate pain or severe pain.   HYDROXYZINE (VISTARIL) 50 MG CAPSULE    Take 100 mg by mouth at bedtime. sleep   INSULIN NPH-REGULAR HUMAN (NOVOLIN 70/30) (70-30) 100 UNIT/ML INJECTION    Inject 28 Units into the skin 2 (two) times daily with a meal.   LEVOTHYROXINE (  SYNTHROID, LEVOTHROID) 75 MCG TABLET    Take 1 tablet (75 mcg total) by mouth every morning.   QUINAPRIL (ACCUPRIL) 5 MG TABLET    Take 2.5 mg by mouth every morning.    VENLAFAXINE XR (EFFEXOR-XR) 37.5 MG 24 HR CAPSULE    Take 37.5-75 mg by mouth 2 (two) times daily. Take 2 capsules= 75mg  in the morning and take 1 capsule = 37.5mg  at bedtime   VITAMIN C (VITAMIN C) 500 MG TABLET    Take 1 tablet (500 mg total) by mouth daily.   VITAMIN D, ERGOCALCIFEROL, (DRISDOL) 50000 UNITS CAPS CAPSULE    Take 1 capsule (50,000 Units total) by mouth every 7 (seven) days.   ZOLPIDEM (AMBIEN CR) 6.25 MG CR TABLET    Take 1 tablet (6.25 mg total) by mouth at bedtime as needed for sleep.  Modified Medications   No medications on file  Discontinued Medications   No medications on file    Review of Systems  Unable to perform ROS: Psychiatric disorder    Filed Vitals:   07/26/15 2257  BP: 138/62  Pulse: 92  Temp: 97.5 F (36.4 C)  Weight: 344 lb (156.037 kg)   Body mass index is 62.9 kg/(m^2).  Physical Exam  Constitutional: She appears well-developed and well-nourished.  Sitting in w/c in NAD, flat affect  HENT:  Mouth/Throat: Oropharynx is clear and moist. No oropharyngeal exudate.  Eyes: Pupils are equal, round, and reactive to light. No scleral icterus.  Neck: Neck supple. Carotid bruit is not present. No tracheal deviation present. No thyromegaly present.  Cardiovascular: Normal rate, regular rhythm, normal heart sounds and  intact distal pulses.  Exam reveals no gallop and no friction rub.   No murmur heard. Trace LE edema b/l. No calf TTP  Pulmonary/Chest: Effort normal and breath sounds normal. No stridor. No respiratory distress. She has no wheezes. She has no rales.  Abdominal: Soft. Bowel sounds are normal. She exhibits no distension and no mass. There is no hepatomegaly. There is no tenderness. There is no rebound and no guarding.  Reducible ventral hernia, egg sized, NT.  Musculoskeletal: She exhibits edema.  Mild right proximal leg sutures intact. No d/c. Min redness. NT  Lymphadenopathy:    She has no cervical adenopathy.  Neurological: She is alert.  Skin: Skin is warm and dry. No rash noted.  Psychiatric: Her behavior is normal. Blunt: flat affect.     Labs reviewed: Admission on 07/16/2015, Discharged on 07/21/2015  No results displayed because visit has over 200 results.      Dg Knee Complete 4 Views Right  07/19/2015  CLINICAL DATA:  Surgical internal fixation of the right tibial plateau. 27 seconds of fluoroscopy time provided for the procedure. EXAM: RIGHT KNEE - COMPLETE 4+ VIEW; DG C-ARM 61-120 MIN COMPARISON:  Plain film examination of the right knee dated 07/16/2015. FINDINGS: Four intraoperative fluoroscopic images are provided showing plate and screw fixation of the proximal right tibia fracture. Hardware appears intact and well positioned. Previous hemiarthroplasty hardware appears well positioned. No surgical complicating feature identified. IMPRESSION: Intraoperative fluoroscopic images showing plate and screw fixation of the tibial plateau/proximal tibia fracture. 27 seconds of fluoroscopy time provided for the procedure. Electronically Signed   By: Franki Cabot M.D.   On: 07/19/2015 11:14   Dg Knee Complete 4 Views Right  07/16/2015  CLINICAL DATA:  Right knee pain after tripping over her foot and falling today. Status post partial right knee replacement this year. EXAM: RIGHT KNEE  -  COMPLETE 4+ VIEW COMPARISON:  None. FINDINGS: Comminuted fracture involving the medial and lateral tibial plateaus with extension into the central joint space. There is also a medial knee prosthesis. Also noted is a nondisplaced transverse fracture of the fibular head. Probable small effusion. Minimal posterior patellar spur formation. IMPRESSION: 1. Essentially nondisplaced comminuted fracture of the medial and lateral tibial plateaus with intra-articular extension centrally. 2. Nondisplaced fibular head fracture. 3. Minimal patellofemoral degenerative change. 4. Medial knee prosthesis. Electronically Signed   By: Claudie Revering M.D.   On: 07/16/2015 15:26   Dg Knee Right Port  07/19/2015  CLINICAL DATA:  Status post fracture fixation. EXAM: PORTABLE RIGHT KNEE - 1-2 VIEW COMPARISON:  Earlier films, same date. FINDINGS: Medial sideplate and multiple screws transfixing the tibia fracture with anatomic alignment. Stable fibular head fracture. Stable appearance of the medial compartment prosthesis. IMPRESSION: Internal fixation and anatomic reduction of tibia fracture. Electronically Signed   By: Marijo Sanes M.D.   On: 07/19/2015 20:52   Dg Ankle Left Port  07/18/2015  CLINICAL DATA:  Pt tripped and fell at [place] and complains of pain in the left ankle. Pt states that she had recurring sprains and had screws placed in the left ankle in March for added support. EXAM: PORTABLE LEFT ANKLE - 2 VIEW COMPARISON:  Left ankle MRI, 11/25/2010 FINDINGS: No acute fracture. Ankle mortise is normally spaced and aligned. There is a small area of irregularity along the subchondral aspect of the lateral talar dome, not present on the prior MRI. This could be degenerative or reflect a small osteochondral lesion. There is a small well corticated bone density inferior to the medial malleolus which may be due to an old avulsion injury. This is not appear acute. Vascular calcifications are noted along the dorsalis pedis. Soft  tissues are otherwise unremarkable. IMPRESSION: 1. No acute fracture or dislocation. 2. Possible small osteochondral lesion along the lateral talar dome. Electronically Signed   By: Lajean Manes M.D.   On: 07/18/2015 15:41   Dg C-arm 61-120 Min  07/19/2015  CLINICAL DATA:  Surgical internal fixation of the right tibial plateau. 27 seconds of fluoroscopy time provided for the procedure. EXAM: RIGHT KNEE - COMPLETE 4+ VIEW; DG C-ARM 61-120 MIN COMPARISON:  Plain film examination of the right knee dated 07/16/2015. FINDINGS: Four intraoperative fluoroscopic images are provided showing plate and screw fixation of the proximal right tibia fracture. Hardware appears intact and well positioned. Previous hemiarthroplasty hardware appears well positioned. No surgical complicating feature identified. IMPRESSION: Intraoperative fluoroscopic images showing plate and screw fixation of the tibial plateau/proximal tibia fracture. 27 seconds of fluoroscopy time provided for the procedure. Electronically Signed   By: Franki Cabot M.D.   On: 07/19/2015 11:14     Assessment/Plan   ICD-9-CM ICD-10-CM   1. Pathological fracture due to secondary osteoporosis, R tibial plateau  733.10 M80.80XA    733.00     s/p ORIF  2. Other specified hypothyroidism 244.8 E03.8   3. Essential hypertension 401.9 I10   4. Major depressive disorder, recurrent episode, mild (HCC) 296.31 F33.0   5. Diabetes mellitus due to underlying condition, uncontrolled, with diabetic neuropathy, with long-term current use of insulin (HCC) 249.61 E08.40    V58.67 E08.65     Z79.4   6. Morbid obesity due to excess calories ([place]) 278.01 E66.01   7. Cushing disease ([place]) 255.0 E24.0   8. Vitamin D deficiency 268.9 E55.9   9. Hypertriglyceridemia 272.1 E78.1     Reck  TSH in 6 weeks  Cont current meds as ordered  PT/OT as ordered. She is NWB x 8 weeks  F/u with Ortho as scheduled  Fall precautions  GOAL: short term rehab and d/c home when  medically appropriate. Communicated with pt and nursing.  Will follow  Sandrine Bloodsworth S. Perlie Gold  [facility] [address] [place], [place] [address] [phone] Cell (Monday-Friday 8 AM - 5 PM) [phone] After 5 PM and follow prompts

## 2015-08-15 ENCOUNTER — Non-Acute Institutional Stay (SKILLED_NURSING_FACILITY): Payer: 59 | Admitting: Adult Health

## 2015-08-15 DIAGNOSIS — IMO0002 Reserved for concepts with insufficient information to code with codable children: Secondary | ICD-10-CM

## 2015-08-15 DIAGNOSIS — E0840 Diabetes mellitus due to underlying condition with diabetic neuropathy, unspecified: Secondary | ICD-10-CM | POA: Diagnosis not present

## 2015-08-15 DIAGNOSIS — E034 Atrophy of thyroid (acquired): Secondary | ICD-10-CM

## 2015-08-15 DIAGNOSIS — F330 Major depressive disorder, recurrent, mild: Secondary | ICD-10-CM

## 2015-08-15 DIAGNOSIS — I10 Essential (primary) hypertension: Secondary | ICD-10-CM

## 2015-08-15 DIAGNOSIS — E0865 Diabetes mellitus due to underlying condition with hyperglycemia: Secondary | ICD-10-CM

## 2015-08-15 DIAGNOSIS — M8080XA Other osteoporosis with current pathological fracture, unspecified site, initial encounter for fracture: Secondary | ICD-10-CM | POA: Diagnosis not present

## 2015-08-15 DIAGNOSIS — Z794 Long term (current) use of insulin: Secondary | ICD-10-CM | POA: Diagnosis not present

## 2015-08-15 DIAGNOSIS — E240 Pituitary-dependent Cushing's disease: Secondary | ICD-10-CM | POA: Diagnosis not present

## 2015-08-15 DIAGNOSIS — E038 Other specified hypothyroidism: Secondary | ICD-10-CM

## 2015-08-22 ENCOUNTER — Non-Acute Institutional Stay (SKILLED_NURSING_FACILITY): Payer: 59 | Admitting: Adult Health

## 2015-08-22 DIAGNOSIS — E0840 Diabetes mellitus due to underlying condition with diabetic neuropathy, unspecified: Secondary | ICD-10-CM | POA: Diagnosis not present

## 2015-08-22 DIAGNOSIS — E0865 Diabetes mellitus due to underlying condition with hyperglycemia: Secondary | ICD-10-CM | POA: Diagnosis not present

## 2015-08-22 DIAGNOSIS — Z794 Long term (current) use of insulin: Secondary | ICD-10-CM | POA: Diagnosis not present

## 2015-08-22 DIAGNOSIS — M8080XA Other osteoporosis with current pathological fracture, unspecified site, initial encounter for fracture: Secondary | ICD-10-CM

## 2015-08-22 DIAGNOSIS — IMO0002 Reserved for concepts with insufficient information to code with codable children: Secondary | ICD-10-CM

## 2015-08-22 DIAGNOSIS — E240 Pituitary-dependent Cushing's disease: Secondary | ICD-10-CM | POA: Diagnosis not present

## 2015-08-27 DIAGNOSIS — M80061D Age-related osteoporosis with current pathological fracture, right lower leg, subsequent encounter for fracture with routine healing: Secondary | ICD-10-CM | POA: Diagnosis not present

## 2015-08-27 DIAGNOSIS — E1140 Type 2 diabetes mellitus with diabetic neuropathy, unspecified: Secondary | ICD-10-CM | POA: Diagnosis not present

## 2015-08-27 DIAGNOSIS — Z6841 Body Mass Index (BMI) 40.0 and over, adult: Secondary | ICD-10-CM | POA: Diagnosis not present

## 2015-08-27 DIAGNOSIS — Z9181 History of falling: Secondary | ICD-10-CM | POA: Diagnosis not present

## 2015-08-27 DIAGNOSIS — I10 Essential (primary) hypertension: Secondary | ICD-10-CM | POA: Diagnosis not present

## 2015-09-09 ENCOUNTER — Encounter: Payer: Self-pay | Admitting: Adult Health

## 2015-09-09 NOTE — Progress Notes (Signed)
Patient ID: [name], female   DOB: [birth year], 39 y.o.   MRN: [id number]    Facility: [facility]      Allergies  Allergen Reactions  . Penicillins Hives    Has patient had a PCN reaction causing immediate rash, facial/tongue/throat swelling, SOB or lightheadedness with hypotension: Yes Has patient had a PCN reaction causing severe rash involving mucus membranes or skin necrosis: Yes Has patient had a PCN reaction that required hospitalization No Has patient had a PCN reaction occurring within the last 10 years: No If all of the above answers are "NO", then may proceed with Cephalosporin use.     Chief Complaint  Patient presents with  . Medical Management of Chronic Issues    HPI:  She is a resident of this facility being seen for the management of her chronic illnesses. Her cbg's have been elevated. She tells me that she was taking wellbutrin xl 150 mg daily prior to her hospitalization in addition to her other medications. She states that this regimen is effective in managing her mental health. She is not voicing other concerns at this time.    Past Medical History  Diagnosis Date  . Hyperlipidemia   . Cushing's disease ([name])   . Morbid obesity ([name])   . Diabetes ([name])   . Hyperlipidemia   . Hypertension   . Periprosthetic fracture 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[name]), R tibial plateau  07/18/2015  . Vitamin D deficiency 07/19/2015  . Osteoporosis 07/19/2015  . Uncontrolled diabetes mellitus with diabetic neuropathy, with long-term current use of insulin ([name]) 07/16/2015    Past Surgical History  Procedure Laterality Date  . Cesarean section  dec 1997/  06-03-2001/   01-01-2005    BILATERAL TUBAL LIGATION WITH LAST ONE  . Partial knee arthroplasty Right 04/19/2014    Procedure: RIGHT UNI KNEE ARTHROPLASTY MEDIALLY ;  Surgeon: Mauri Pole, MD;  Location: [facility];  Service: Orthopedics;  Laterality: Right;  . Laparoscopic cholecystectomy  09-25-2005  . Dilation  and curettage of uterus  1995    WITH SUCTION  . Anterior talofibular ligament repair Left 11/15/2014    Procedure: ANTERIOR TALOFIBULAR LIGAMENT REPAIR;  Surgeon: [name], DPM;  Location: [place];  Service: Podiatry;  Laterality: Left;  . Orif tibia plateau Right 07/19/2015    Procedure: OPEN REDUCTION INTERNAL FIXATION (ORIF) RIGHT TIBIAL PLATEAU;  Surgeon: [name] Fairview, MD;  Location: [place];  Service: Orthopedics;  Laterality: Right;    VITAL SIGNS BP 133/63 mmHg  Pulse 60  Ht 5\' 2"  (1.575 m)  Wt 344 lb (156.037 kg)  BMI 62.90 kg/m2  Patient's Medications  New Prescriptions   No medications on file  Previous Medications   ACETAMINOPHEN (TYLENOL) 325 MG TABLET    Take 2 tablets (650 mg total) by mouth every 6 (six) hours as needed for mild pain (or Fever >/= 101).   CANAGLIFLOZIN (INVOKANA) 300 MG TABS TABLET    Take 300 mg by mouth daily before breakfast.   CETIRIZINE HCL (ZYRTEC ALLERGY) 10 MG CAPS    Take 1 capsule (10 mg total) by mouth daily.   CHOLECALCIFEROL 1000 UNITS TABLET    Take 1 tablet (1,000 Units total) by mouth 2 (two) times daily.   CHOLESTYRAMINE (QUESTRAN) 4 G PACKET    Take 1 packet (4 g total) by mouth 2 (two) times daily with a meal.   ENOXAPARIN (LOVENOX) 80 MG/0.8ML INJECTION    Inject 0.8 mLs (80 mg total)  into the skin daily. X 3 weeks   FLUTICASONE (FLONASE) 50 MCG/ACT NASAL SPRAY    1 spray each nares bid   GABAPENTIN (NEURONTIN) 300 MG CAPSULE    Take 600 mg by mouth 2 (two) times daily.    GEMFIBROZIL (LOPID) 600 MG TABLET    Take 600 mg by mouth 2 (two) times daily before a meal.   HYDROCODONE-ACETAMINOPHEN (NORCO) 7.5-325 MG TABLET    Take 1-2 tablets by mouth every 6 (six) hours as needed for moderate pain or severe pain.   HYDROXYZINE (VISTARIL) 50 MG CAPSULE    Take 100 mg by mouth at bedtime. sleep   INSULIN NPH-REGULAR HUMAN (NOVOLIN 70/30) (70-30) 100 UNIT/ML INJECTION    Inject 28 Units into the skin 2 (two) times daily  with a meal.   LEVOTHYROXINE (SYNTHROID, LEVOTHROID) 75 MCG TABLET    Take 1 tablet (75 mcg total) by mouth every morning.   QUINAPRIL (ACCUPRIL) 5 MG TABLET    Take 2.5 mg by mouth every morning.    VENLAFAXINE XR (EFFEXOR-XR) 37.5 MG 24 HR CAPSULE    Take 37.5-75 mg by mouth 2 (two) times daily. Take 2 capsules= 75mg  in the morning and take 1 capsule = 37.5mg  at bedtime   VITAMIN C (VITAMIN C) 500 MG TABLET    Take 1 tablet (500 mg total) by mouth daily.   VITAMIN D, ERGOCALCIFEROL, (DRISDOL) 50000 UNITS CAPS CAPSULE    Take 1 capsule (50,000 Units total) by mouth every 7 (seven) days.   ZOLPIDEM (AMBIEN CR) 6.25 MG CR TABLET    Take 1 tablet (6.25 mg total) by mouth at bedtime as needed for sleep.  Modified Medications   No medications on file  Discontinued Medications   No medications on file     SIGNIFICANT DIAGNOSTIC EXAMS   07-16-15: right knee x-ray: 1. Essentially nondisplaced comminuted fracture of the medial and lateral tibial plateaus with intra-articular extension centrally. 2. Nondisplaced fibular head fracture. 3. Minimal patellofemoral degenerative change. 4. Medial knee prosthesis.  07-18-15: left ankle: 1. No acute fracture or dislocation. 2. Possible small osteochondral lesion along the lateral talar dome.   07-19-15: right x-ray: Internal fixation and anatomic reduction of tibia fracture    LABS REVIEWED:   07-16-15: wbc 12.;3 hgb 12.9; hct 40.0; mcv 85.3; plt 360; glucose 57; bun 12; creat 0.64; k+ 4.4; na++138; liver normal albumin 3.3 mag 1.8; phos 4.6; hgb a1c 7.0; tsh 6.272 07-18-15: wbc 8.5; hgb 11.9; hct 37.7; mcv 86.3; plt 364; glucose 149; bun 16; creat 0.69; k+ 4.4; na++138; liver normal albumin 3.1; vit d 11.6 07-20-15: wbc 9.6 hgb 11.2; hct 35.0; mcv 85.4; ;plt 328; glucose 138; bun 13; creat 0.61; k+ 4.8; na++136; liver normal albumin 2.8; pre-albumin 19.7; pth 49; ca 8.6   Review of Systems  Constitutional: Negative for malaise/fatigue.  Respiratory:  Negative for cough and shortness of breath.   Cardiovascular: Negative for chest pain, palpitations and leg swelling.  Gastrointestinal: Negative for heartburn, abdominal pain and constipation.  Musculoskeletal: Positive for joint pain. Negative for myalgias.       Right knee pain   Skin: Negative.   Psychiatric/Behavioral: Negative for depression. The patient is not nervous/anxious.      Physical Exam  Constitutional: She is oriented to person, place, and time. No distress.  Morbidly obese   Eyes: Conjunctivae are normal.  Neck: Neck supple. No JVD present. No thyromegaly present.  Cardiovascular: Normal rate, regular rhythm and intact distal pulses.  Respiratory: Effort normal and breath sounds normal. No respiratory distress. She has no wheezes.  GI: Soft. Bowel sounds are normal. She exhibits no distension. There is no tenderness.  Musculoskeletal: She exhibits no edema.  Able to move all extremities  Is non-weight bearing to right leg Is status post right tibial plateau fracture   Lymphadenopathy:    She has no cervical adenopathy.  Neurological: She is alert and oriented to person, place, and time.  Skin: Skin is warm and dry. She is not diaphoretic.  Psychiatric: She has a normal mood and affect.        ASSESSMENT/ PLAN:  1. Right tibial plateau fracture; will continue therapy as directed; will follow up with orthopedics as indicated. Will vicodin to 7.5/325 mg every 6 hours routinely and every 6 hours as needed will continue to monitor her status.   2. Vit d deficiency: will continue vitamin d 50,000 units weekly and 1000 units twice daily her vit d level was 11.6   3. Diabetes will continue invokana 300 mg daily will increase  NPH 32 units twice daily  hgb a1c 7.0  4. Hypothyroidism: will continue synthroid 75 mcg daily will check tsh in one month tsh is 6.272   5. Hypertension:  Will continue accupril 2.5 mg daily   6. Hypertriglyceridemia: will continue lopid  600 mg twice daily  7. Major depression: will continue effexor xr 75 mg in the am and 37.5 mg in the pm;  Will begin wellbutrin xl 150 mg daily will continue to monitor  8.  Insomnia; will continue ambien cr 6.25 mg nightly prn and hydroxyzine 100 mg nightly   9. peripheral neuropathy: will continue neurontin 600 mg twice daily   10. Allergic rhinitis: will continue zyrtec 10 mg daily as needed; flonase twice daily   11. Morbid obesity/cushing disease: no change in status will monitor  12. Obstructive sleep apnea will use cpap at [place]           Ok Edwards NP [facility]  Contact [phone] Monday through Friday 8am- 5pm  After hours call [phone]

## 2015-09-09 NOTE — Progress Notes (Signed)
Patient ID: [name], female   DOB: 08/01/1976, 39 y.o.   MRN: [id number]    Facility: [facility]      Allergies  Allergen Reactions  . Penicillins Hives    Has patient had a PCN reaction causing immediate rash, facial/tongue/throat swelling, SOB or lightheadedness with hypotension: Yes Has patient had a PCN reaction causing severe rash involving mucus membranes or skin necrosis: Yes Has patient had a PCN reaction that required hospitalization No Has patient had a PCN reaction occurring within the last 10 years: No If all of the above answers are "NO", then may proceed with Cephalosporin use.     Chief Complaint  Patient presents with  . Discharge Note    HPI:  She is being discharged to home with home health for pt/ot. She will need a standard bariatric wheelchair with elevating leg rests. She will need her prescriptions written and will need to follow up with her pcp.    Past Medical History  Diagnosis Date  . Hyperlipidemia   . Cushing's disease ([place])   . Morbid obesity ([place])   . Diabetes ([place])   . Hyperlipidemia   . Hypertension   . Periprosthetic fracture 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[place]), R tibial plateau  07/18/2015  . Vitamin D deficiency 07/19/2015  . Osteoporosis 07/19/2015  . Uncontrolled diabetes mellitus with diabetic neuropathy, with long-term current use of insulin ([place]) 07/16/2015    Past Surgical History  Procedure Laterality Date  . Cesarean section  dec 1997/  06-03-2001/   01-01-2005    BILATERAL TUBAL LIGATION WITH LAST ONE  . Partial knee arthroplasty Right 04/19/2014    Procedure: RIGHT UNI KNEE ARTHROPLASTY MEDIALLY ;  Surgeon: Mauri Pole, MD;  Location: [facility];  Service: Orthopedics;  Laterality: Right;  . Laparoscopic cholecystectomy  09-25-2005  . Dilation and curettage of uterus  1995    WITH SUCTION  . Anterior talofibular ligament repair Left 11/15/2014    Procedure: ANTERIOR TALOFIBULAR LIGAMENT REPAIR;  Surgeon: Jana Half, DPM;  Location: [place];  Service: Podiatry;  Laterality: Left;  . Orif tibia plateau Right 07/19/2015    Procedure: OPEN REDUCTION INTERNAL FIXATION (ORIF) RIGHT TIBIAL PLATEAU;  Surgeon: Altamese [place], MD;  Location: [place];  Service: Orthopedics;  Laterality: Right;    VITAL SIGNS BP 130/48 mmHg  Pulse 86  Ht 5\' 2"  (1.575 m)  Wt 344 lb (156.037 kg)  BMI 62.90 kg/m2  Patient's Medications  New Prescriptions   No medications on file  Previous Medications   ACETAMINOPHEN (TYLENOL) 325 MG TABLET    Take 2 tablets (650 mg total) by mouth every 6 (six) hours as needed for mild pain (or Fever >/= 101).   CANAGLIFLOZIN (INVOKANA) 300 MG TABS TABLET    Take 300 mg by mouth daily before breakfast.   CETIRIZINE HCL (ZYRTEC ALLERGY) 10 MG CAPS    Take 1 capsule (10 mg total) by mouth daily.   CHOLECALCIFEROL 1000 UNITS TABLET    Take 1 tablet (1,000 Units total) by mouth 2 (two) times daily.   CHOLESTYRAMINE (QUESTRAN) 4 G PACKET    Take 1 packet (4 g total) by mouth 2 (two) times daily with a meal.   ENOXAPARIN (LOVENOX) 80 MG/0.8ML INJECTION    Inject 0.8 mLs (80 mg total) into the skin daily. X 3 weeks   FLUTICASONE (FLONASE) 50 MCG/ACT NASAL SPRAY    1 spray each nares bid   GABAPENTIN (NEURONTIN) 300 MG CAPSULE  Take 600 mg by mouth 2 (two) times daily.    GEMFIBROZIL (LOPID) 600 MG TABLET    Take 600 mg by mouth 2 (two) times daily before a meal.   HYDROCODONE-ACETAMINOPHEN (NORCO) 7.5-325 MG TABLET    Take 1-2 tablets by mouth every 6 (six) hours as needed for moderate pain or severe pain.   HYDROXYZINE (VISTARIL) 50 MG CAPSULE    Take 100 mg by mouth at bedtime. sleep   INSULIN NPH-REGULAR HUMAN (NOVOLIN 70/30) (70-30) 100 UNIT/ML INJECTION    Inject 28 Units into the skin 2 (two) times daily with a meal.   LEVOTHYROXINE (SYNTHROID, LEVOTHROID) 75 MCG TABLET    Take 1 tablet (75 mcg total) by mouth every morning.   QUINAPRIL (ACCUPRIL) 5 MG TABLET    Take 2.5  mg by mouth every morning.    VENLAFAXINE XR (EFFEXOR-XR) 37.5 MG 24 HR CAPSULE    Take 37.5-75 mg by mouth 2 (two) times daily. Take 2 capsules= 75mg  in the morning and take 1 capsule = 37.5mg  at bedtime   VITAMIN C (VITAMIN C) 500 MG TABLET    Take 1 tablet (500 mg total) by mouth daily.   VITAMIN D, ERGOCALCIFEROL, (DRISDOL) 50000 UNITS CAPS CAPSULE    Take 1 capsule (50,000 Units total) by mouth every 7 (seven) days.   ZOLPIDEM (AMBIEN CR) 6.25 MG CR TABLET    Take 1 tablet (6.25 mg total) by mouth at bedtime as needed for sleep.  Modified Medications   No medications on file  Discontinued Medications   No medications on file     SIGNIFICANT DIAGNOSTIC EXAMS  07-16-15: right knee x-ray: 1. Essentially nondisplaced comminuted fracture of the medial and lateral tibial plateaus with intra-articular extension centrally. 2. Nondisplaced fibular head fracture. 3. Minimal patellofemoral degenerative change. 4. Medial knee prosthesis.  07-18-15: left ankle: 1. No acute fracture or dislocation. 2. Possible small osteochondral lesion along the lateral talar dome.   07-19-15: right x-ray: Internal fixation and anatomic reduction of tibia fracture    LABS REVIEWED:   07-16-15: wbc 12.;3 hgb 12.9; hct 40.0; mcv 85.3; plt 360; glucose 57; bun 12; creat 0.64; k+ 4.4; na++138; liver normal albumin 3.3 mag 1.8; phos 4.6; hgb a1c 7.0; tsh 6.272 07-18-15: wbc 8.5; hgb 11.9; hct 37.7; mcv 86.3; plt 364; glucose 149; bun 16; creat 0.69; k+ 4.4; na++138; liver normal albumin 3.1; vit d 11.6 07-20-15: wbc 9.6 hgb 11.2; hct 35.0; mcv 85.4; ;plt 328; glucose 138; bun 13; creat 0.61; k+ 4.8; na++136; liver normal albumin 2.8; pre-albumin 19.7; pth 49; ca 8.6   Review of Systems  Constitutional: Negative for malaise/fatigue.  Respiratory: Negative for cough and shortness of breath.   Cardiovascular: Negative for chest pain, palpitations and leg swelling.  Gastrointestinal: Negative for heartburn, abdominal  pain and constipation.  Musculoskeletal: Positive for joint pain. Negative for myalgias.       Right knee pain   Skin: Negative.   Psychiatric/Behavioral: Negative for depression. The patient is not nervous/anxious.      Physical Exam  Constitutional: She is oriented to person, place, and time. No distress.  Morbidly obese   Eyes: Conjunctivae are normal.  Neck: Neck supple. No JVD present. No thyromegaly present.  Cardiovascular: Normal rate, regular rhythm and intact distal pulses.   Respiratory: Effort normal and breath sounds normal. No respiratory distress. She has no wheezes.  GI: Soft. Bowel sounds are normal. She exhibits no distension. There is no tenderness.  Musculoskeletal: She exhibits no  edema.  Able to move all extremities  Is non-weight bearing to right leg Is status post right tibial plateau fracture   Lymphadenopathy:    She has no cervical adenopathy.  Neurological: She is alert and oriented to person, place, and time.  Skin: Skin is warm and dry. She is not diaphoretic.  Psychiatric: She has a normal mood and affect.         ASSESSMENT/ PLAN:  Will discharge her to home with home health to evaluate and treat as indicated for gait; transfers; strength and adl training. She will need bariatric standard wheelchair with elevating leg rests in order to maintain her current level of independence with her adls which cannot be achieved with a walker. She can self propel.  Her prescriptions have been written for a 30 day supply of her medications with #30 ambein cr 6.25 mg tabs; #30 vicodin 7.5/325 mg tabs. She has a follow up with Dr. Junita Push on 09-08-15 at 1 pm and Dr. Marcelino Scot on 08-24-15 at 10:15 am.    Time spent with patient  45  minutes >50% time spent counseling; reviewing medical record; tests; labs; and developing future plan of care   Ok Edwards NP [facility]  Contact [phone] Monday through Friday 8am- 5pm  After hours call  [phone]

## 2015-11-29 ENCOUNTER — Other Ambulatory Visit: Payer: Self-pay | Admitting: Internal Medicine

## 2015-11-29 ENCOUNTER — Other Ambulatory Visit (HOSPITAL_COMMUNITY): Payer: Self-pay | Admitting: Internal Medicine

## 2015-11-29 DIAGNOSIS — E559 Vitamin D deficiency, unspecified: Secondary | ICD-10-CM

## 2015-12-15 ENCOUNTER — Other Ambulatory Visit: Payer: Self-pay | Admitting: Internal Medicine

## 2015-12-15 DIAGNOSIS — E559 Vitamin D deficiency, unspecified: Secondary | ICD-10-CM

## 2015-12-15 DIAGNOSIS — M810 Age-related osteoporosis without current pathological fracture: Secondary | ICD-10-CM

## 2015-12-19 ENCOUNTER — Inpatient Hospital Stay: Admission: RE | Admit: 2015-12-19 | Payer: Medicaid Other | Source: Ambulatory Visit

## 2015-12-19 ENCOUNTER — Other Ambulatory Visit (HOSPITAL_COMMUNITY): Payer: Self-pay | Admitting: Orthopedic Surgery

## 2015-12-19 DIAGNOSIS — Z96651 Presence of right artificial knee joint: Secondary | ICD-10-CM

## 2015-12-28 ENCOUNTER — Ambulatory Visit (HOSPITAL_COMMUNITY)
Admission: RE | Admit: 2015-12-28 | Discharge: 2015-12-28 | Disposition: A | Discharge status: Home / Self Care | Payer: Medicaid Other | Source: Ambulatory Visit | Attending: Orthopedic Surgery | Admitting: Orthopedic Surgery

## 2015-12-28 ENCOUNTER — Encounter (HOSPITAL_COMMUNITY)
Admission: RE | Admit: 2015-12-28 | Discharge: 2015-12-28 | Disposition: A | Discharge status: Home / Self Care | Payer: Medicaid Other | Source: Ambulatory Visit | Attending: Orthopedic Surgery | Admitting: Orthopedic Surgery

## 2015-12-28 DIAGNOSIS — Z96651 Presence of right artificial knee joint: Secondary | ICD-10-CM | POA: Diagnosis not present

## 2015-12-28 MED ORDER — TECHNETIUM TC 99M MEDRONATE IV KIT
25.0000 | PACK | Freq: Once | INTRAVENOUS | Status: AC | PRN
  Administered 2015-12-28: 25 via INTRAVENOUS

## 2016-01-04 ENCOUNTER — Ambulatory Visit
Admission: RE | Admit: 2016-01-04 | Discharge: 2016-01-04 | Disposition: A | Discharge status: Home / Self Care | Payer: Medicaid Other | Source: Ambulatory Visit | Attending: Internal Medicine | Admitting: Internal Medicine

## 2016-01-04 DIAGNOSIS — M810 Age-related osteoporosis without current pathological fracture: Secondary | ICD-10-CM

## 2016-01-04 DIAGNOSIS — E559 Vitamin D deficiency, unspecified: Secondary | ICD-10-CM

## 2016-01-15 ENCOUNTER — Emergency Department (HOSPITAL_COMMUNITY)
Admission: EM | Admit: 2016-01-15 | Discharge: 2016-01-15 | Disposition: A | Discharge status: Home / Self Care | Payer: Medicaid Other | Attending: Emergency Medicine | Admitting: Emergency Medicine

## 2016-01-15 ENCOUNTER — Encounter (HOSPITAL_COMMUNITY): Payer: Self-pay | Admitting: Emergency Medicine

## 2016-01-15 DIAGNOSIS — S161XXA Strain of muscle, fascia and tendon at neck level, initial encounter: Secondary | ICD-10-CM

## 2016-01-15 DIAGNOSIS — S0990XA Unspecified injury of head, initial encounter: Secondary | ICD-10-CM | POA: Diagnosis not present

## 2016-01-15 DIAGNOSIS — M810 Age-related osteoporosis without current pathological fracture: Secondary | ICD-10-CM | POA: Diagnosis not present

## 2016-01-15 DIAGNOSIS — E785 Hyperlipidemia, unspecified: Secondary | ICD-10-CM | POA: Insufficient documentation

## 2016-01-15 DIAGNOSIS — E559 Vitamin D deficiency, unspecified: Secondary | ICD-10-CM | POA: Diagnosis not present

## 2016-01-15 DIAGNOSIS — E1140 Type 2 diabetes mellitus with diabetic neuropathy, unspecified: Secondary | ICD-10-CM | POA: Diagnosis not present

## 2016-01-15 DIAGNOSIS — Z880 Allergy status to penicillin: Secondary | ICD-10-CM | POA: Diagnosis not present

## 2016-01-15 DIAGNOSIS — Y9389 Activity, other specified: Secondary | ICD-10-CM | POA: Insufficient documentation

## 2016-01-15 DIAGNOSIS — Y998 Other external cause status: Secondary | ICD-10-CM | POA: Insufficient documentation

## 2016-01-15 DIAGNOSIS — Z794 Long term (current) use of insulin: Secondary | ICD-10-CM | POA: Diagnosis not present

## 2016-01-15 DIAGNOSIS — Z7984 Long term (current) use of oral hypoglycemic drugs: Secondary | ICD-10-CM | POA: Diagnosis not present

## 2016-01-15 DIAGNOSIS — I10 Essential (primary) hypertension: Secondary | ICD-10-CM | POA: Diagnosis not present

## 2016-01-15 DIAGNOSIS — F17210 Nicotine dependence, cigarettes, uncomplicated: Secondary | ICD-10-CM | POA: Diagnosis not present

## 2016-01-15 DIAGNOSIS — Y9289 Other specified places as the place of occurrence of the external cause: Secondary | ICD-10-CM | POA: Diagnosis not present

## 2016-01-15 DIAGNOSIS — Z79899 Other long term (current) drug therapy: Secondary | ICD-10-CM | POA: Insufficient documentation

## 2016-01-15 DIAGNOSIS — S199XXA Unspecified injury of neck, initial encounter: Secondary | ICD-10-CM | POA: Diagnosis present

## 2016-01-15 MED ORDER — CYCLOBENZAPRINE HCL 10 MG PO TABS: 10.0000 mg | ORAL_TABLET | Freq: Two times a day (BID) | ORAL | Status: DC | PRN

## 2016-01-15 MED ORDER — HYDROCODONE-ACETAMINOPHEN 5-325 MG PO TABS
2.0000 | ORAL_TABLET | Freq: Once | ORAL | Status: AC
  Administered 2016-01-15: 2 via ORAL
  Filled 2016-01-15: qty 2
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
## 2016-01-15 NOTE — Discharge Instructions (Signed)
Cervical Sprain  A cervical sprain is an injury in the neck in which the strong, fibrous tissues (ligaments) that connect your neck bones stretch or tear. Cervical sprains can range from mild to severe. Severe cervical sprains can cause the neck vertebrae to be unstable. This can lead to damage of the spinal cord and can result in serious nervous system problems. The amount of time it takes for a cervical sprain to get better depends on the cause and extent of the injury. Most cervical sprains heal in 1 to 3 weeks.  CAUSES   Severe cervical sprains may be caused by:    Contact sport injuries (such as from football, rugby, wrestling, hockey, auto racing, gymnastics, diving, martial arts, or boxing).    Motor vehicle collisions.    Whiplash injuries. This is an injury from a sudden forward and backward whipping movement of the head and neck.   Falls.   Mild cervical sprains may be caused by:    Being in an awkward position, such as while cradling a telephone between your ear and shoulder.    Sitting in a chair that does not offer proper support.    Working at a poorly designed computer station.    Looking up or down for long periods of time.   SYMPTOMS    Pain, soreness, stiffness, or a burning sensation in the front, back, or sides of the neck. This discomfort may develop immediately after the injury or slowly, 24 hours or more after the injury.    Pain or tenderness directly in the middle of the back of the neck.    Shoulder or upper back pain.    Limited ability to move the neck.    Headache.    Dizziness.    Weakness, numbness, or tingling in the hands or arms.    Muscle spasms.    Difficulty swallowing or chewing.    Tenderness and swelling of the neck.   DIAGNOSIS   Most of the time your health care provider can diagnose a cervical sprain by taking your history and doing a physical exam. Your health care provider will ask about previous neck injuries and any known neck  problems, such as arthritis in the neck. X-rays may be taken to find out if there are any other problems, such as with the bones of the neck. Other tests, such as a CT scan or MRI, may also be needed.   TREATMENT   Treatment depends on the severity of the cervical sprain. Mild sprains can be treated with rest, keeping the neck in place (immobilization), and pain medicines. Severe cervical sprains are immediately immobilized. Further treatment is done to help with pain, muscle spasms, and other symptoms and may include:   Medicines, such as pain relievers, numbing medicines, or muscle relaxants.    Physical therapy. This may involve stretching exercises, strengthening exercises, and posture training. Exercises and improved posture can help stabilize the neck, strengthen muscles, and help stop symptoms from returning.   HOME CARE INSTRUCTIONS    Put ice on the injured area.     Put ice in a plastic bag.     Place a towel between your skin and the bag.     Leave the ice on for 15-20 minutes, 3-4 times a day.    If your injury was severe, you may have been given a cervical collar to wear. A cervical collar is a two-piece collar designed to keep your neck from moving while it heals.      Do not remove the collar unless instructed by your health care provider.    If you have long hair, keep it outside of the collar.    Ask your health care provider before making any adjustments to your collar. Minor adjustments may be required over time to improve comfort and reduce pressure on your chin or on the back of your head.    Ifyou are allowed to remove the collar for cleaning or bathing, follow your health care provider's instructions on how to do so safely.    Keep your collar clean by wiping it with mild soap and water and drying it completely. If the collar you have been given includes removable pads, remove them every 1-2 days and hand wash them with soap and water. Allow them to air dry. They should be completely  dry before you wear them in the collar.    If you are allowed to remove the collar for cleaning and bathing, wash and dry the skin of your neck. Check your skin for irritation or sores. If you see any, tell your health care provider.    Do not drive while wearing the collar.    Only take over-the-counter or prescription medicines for pain, discomfort, or fever as directed by your health care provider.    Keep all follow-up appointments as directed by your health care provider.    Keep all physical therapy appointments as directed by your health care provider.    Make any needed adjustments to your workstation to promote good posture.    Avoid positions and activities that make your symptoms worse.    Warm up and stretch before being active to help prevent problems.   SEEK MEDICAL CARE IF:    Your pain is not controlled with medicine.    You are unable to decrease your pain medicine over time as planned.    Your activity level is not improving as expected.   SEEK IMMEDIATE MEDICAL CARE IF:    You develop any bleeding.   You develop stomach upset.   You have signs of an allergic reaction to your medicine.    Your symptoms get worse.    You develop new, unexplained symptoms.    You have numbness, tingling, weakness, or paralysis in any part of your body.   MAKE SURE YOU:    Understand these instructions.   Will watch your condition.   Will get help right away if you are not doing well or get worse.     This information is not intended to replace advice given to you by your health care provider. Make sure you discuss any questions you have with your health care provider.     Document Released: 06/24/2007 Document Revised: 09/01/2013 Document Reviewed: 03/04/2013  Elsevier Interactive Patient Education 2016 [employer]
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
## 2016-04-08 ENCOUNTER — Other Ambulatory Visit: Payer: Self-pay | Admitting: Gastroenterology

## 2016-06-21 ENCOUNTER — Ambulatory Visit: Admit: 2016-06-21 | Payer: BLUE CROSS/BLUE SHIELD

## 2016-06-21 ENCOUNTER — Ambulatory Visit: Admit: 2016-06-21 | Discharge: 2016-06-21 | Payer: BLUE CROSS/BLUE SHIELD | Attending: Orthopaedic Surgery

## 2016-06-21 DIAGNOSIS — M25571 Pain in right ankle and joints of right foot: Secondary | ICD-10-CM

## 2016-06-21 NOTE — Telephone Encounter (Signed)
06/21/16   DME  [id number] - NO PRECERT REQUIRED - NDS

## 2016-06-21 NOTE — Progress Notes (Signed)
CHIEF COMPLAINT  Chief Complaint   Patient presents with   ??? New Patient     right ankle-[facility]         History of Present Illness:  [name] [name] is a 40 y.o. y/o female who presents today for evaluation of Her right ankle.  On 06/18/16 she rolled her ankle which is walking out of hospital [facility].  The next day she went to urgent care where x-rays were taken which demonstrated possible fracture through the plantar calcaneal heel spur as well as possible posterior lateral tubercle of her talus fracture versus an os trigonum because of this she was told be nonweightbearing and placed in a splint.  She's had no other treatment or previous injuries or ankle.  She states it was an inversion mechanism.  She states most of the pain is actually lateral aspect of her ankle.  She hasn't some pain posteriorly.  Denies any numbness or tingling or other symptoms    Occupation: [place] [employer]    PAST MEDICAL/SURGICAL HISTORY     Past Medical History:   Diagnosis Date   ??? Hyperlipidemia 04/16/2014   ??? Hypothyroid 11/06/2012       No past surgical history on file.    Social History     Social History   ??? Marital status: Married     Spouse name: N/A   ??? Number of children: N/A   ??? Years of education: N/A     Occupational History   ??? Not on file.     Social History Main Topics   ??? Smoking status: Never Smoker   ??? Smokeless tobacco: Never Used   ??? Alcohol use Yes      Comment: socially    ??? Drug use: No   ??? Sexual activity: Yes     Partners: Male     Other Topics Concern   ??? Not on file     Social History Narrative   ??? No narrative on file       Family History   Problem Relation Age of Onset   ??? High Blood Pressure Father    ??? Stroke Father    ??? Substance Abuse Father      Alcohol   ??? Cancer Maternal Aunt      Pancreatic   ??? Heart Disease Maternal Aunt    ??? Diabetes Paternal Grandmother    ??? Heart Disease Paternal Grandfather      MI   ??? Stroke  Paternal Grandfather          Review of Systems     Pertinent items are noted in HPI  Review of systems reviewed from Patient History Form dated on 06/21/16 and available in the patient's chart under the Media tab.       PHYSICAL EXAMINATION    Vitals:    06/21/16 0933   BP: 122/80   Pulse: 74   Weight: 264 lb 15.9 oz (120.2 kg)   Height: 5' 7.99" (1.727 m)       General Exam:   Constitutional: Patient is adequately groomed with no evidence of malnutrition  Mental Status: The patient is oriented to time, place and person.  The patient's mood and affect are appropriate.  Lymphatic: The lymphatic examination bilaterally reveals all areas to be without enlargement or induration.  Neurological: The patient has good coordination.  There is no weakness or sensory  deficit.    Gait: Antalgic, crutches    Right Ankle Exam  Inspection:  No bruising, moderate swelling lateral ankle    Palpation:  Tenderness over anterolateral ankle joint, mild tenderness medially, joint, mild tenderness posterior lateral aspect of ankle    Range of Motion:  Flexion 10??, plantar flexion 30??    Sensation:  In tact in all nerve distributions s/sp/dp/s/t    Motor:  Normal motor exam DF/PF/EHL    Skin: There are no additional worrisome rashes, ulcerations or lesions.    Special Tests:  Negative squeeze test, no tenderness proximally in her leg    Circulation normal      Additional Examinations:  Left Lower Extremity: Examination of the left lower extremity does not show any tenderness, deformity or injury.  Range of motion is unremarkable.  There is no gross instability.  There are no rashes, ulcerations or lesions.  Strength and tone are normal.        Radiology:     X-rays obtained and reviewed in office:  AP, lateral, oblique of the right ankle obtained in office today 3 views demonstrate soft tissue swelling medial lateral ankle, plantar calcaneal heel spur does have a lucency at the base, however no displacement does not appear complete and so  there is a low suspicion for fracture.  She does have a ossicle in the posterior aspect of her talus versus posterior talar tubercle fracture which is nondisplaced      Assessment:  40 year old female with right ankle pain, ankle sprain, posterior talar tubercle fracture    Office Procedures:  Orders Placed This Encounter   Procedures   ??? XR ANKLE RIGHT (MIN 3 VIEWS)     [id number]     Order Specific Question:   Reason for exam:     Answer:   Pain   ??? BREG TALL GENISUS WALKING BOOT         PLAN    We reviewed with plan today.  The diagnosis and treatment options were discussed in detail today.  The decision was made to go forward with Nonsurgical treatment.  Given that we cannot identify whether or not this is afracture of her posterior talar tubercle versus a os trigonum will treat this as a fracture.  She will be placed in a walker boot and may weight-bear as tolerated.  I'll see her back in 4 weeks to reevaluate we'll get repeat x-rays 3 views of her right ankle AP, lateral, oblique.  If there are any issues in the interim she should contact the office.  An additional give her a note that states that she can wear gym shoes at work.      Voice Recognition Dictation disclaimer: Please note that portions of this chart were generated using Dragon dictation software. Although every effort was made to ensure the accuracy of this automated transcription, some errors in transcription may have occurred.

## 2016-07-19 ENCOUNTER — Ambulatory Visit: Admit: 2016-07-19 | Discharge: 2016-07-19 | Payer: BLUE CROSS/BLUE SHIELD | Attending: Orthopaedic Surgery

## 2016-07-19 ENCOUNTER — Ambulatory Visit: Admit: 2016-07-19 | Payer: BLUE CROSS/BLUE SHIELD

## 2016-07-19 DIAGNOSIS — R52 Pain, unspecified: Secondary | ICD-10-CM
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
## 2016-08-25 ENCOUNTER — Encounter (HOSPITAL_COMMUNITY): Payer: Self-pay | Admitting: Emergency Medicine

## 2016-08-25 ENCOUNTER — Emergency Department (HOSPITAL_COMMUNITY)
Admission: EM | Admit: 2016-08-25 | Discharge: 2016-08-25 | Disposition: A | Discharge status: Home / Self Care | Payer: Medicaid Other | Attending: Emergency Medicine | Admitting: Emergency Medicine

## 2016-08-25 ENCOUNTER — Emergency Department (HOSPITAL_COMMUNITY): Payer: Medicaid Other

## 2016-08-25 DIAGNOSIS — R112 Nausea with vomiting, unspecified: Secondary | ICD-10-CM

## 2016-08-25 DIAGNOSIS — E039 Hypothyroidism, unspecified: Secondary | ICD-10-CM | POA: Insufficient documentation

## 2016-08-25 DIAGNOSIS — Z79899 Other long term (current) drug therapy: Secondary | ICD-10-CM | POA: Diagnosis not present

## 2016-08-25 DIAGNOSIS — F17210 Nicotine dependence, cigarettes, uncomplicated: Secondary | ICD-10-CM | POA: Insufficient documentation

## 2016-08-25 DIAGNOSIS — E860 Dehydration: Secondary | ICD-10-CM | POA: Diagnosis not present

## 2016-08-25 DIAGNOSIS — R1030 Lower abdominal pain, unspecified: Secondary | ICD-10-CM | POA: Diagnosis present

## 2016-08-25 DIAGNOSIS — Z794 Long term (current) use of insulin: Secondary | ICD-10-CM | POA: Insufficient documentation

## 2016-08-25 DIAGNOSIS — E119 Type 2 diabetes mellitus without complications: Secondary | ICD-10-CM | POA: Insufficient documentation

## 2016-08-25 DIAGNOSIS — I10 Essential (primary) hypertension: Secondary | ICD-10-CM | POA: Diagnosis not present

## 2016-08-25 LAB — COMPREHENSIVE METABOLIC PANEL
ALBUMIN: 2.9 g/dL — AB (ref 3.5–5.0)
ALK PHOS: 78 U/L (ref 38–126)
ALT: 19 U/L (ref 14–54)
ANION GAP: 10 (ref 5–15)
AST: 25 U/L (ref 15–41)
BUN: 9 mg/dL (ref 6–20)
CALCIUM: 8.9 mg/dL (ref 8.9–10.3)
CO2: 22 mmol/L (ref 22–32)
Chloride: 104 mmol/L (ref 101–111)
Creatinine, Ser: 0.72 mg/dL (ref 0.44–1.00)
GFR calc Af Amer: >60 mL/min (ref >60)
GFR calc non Af Amer: >60 mL/min (ref >60)
GLUCOSE: 370 mg/dL — AB (ref 65–99)
Potassium: 4.1 mmol/L (ref 3.5–5.1)
SODIUM: 136 mmol/L (ref 135–145)
Total Bilirubin: 0.5 mg/dL (ref 0.3–1.2)
Total Protein: 6.8 g/dL (ref 6.5–8.1)

## 2016-08-25 LAB — CBC
HEMATOCRIT: 40.4 % (ref 36.0–46.0)
HEMOGLOBIN: 13.7 g/dL (ref 12.0–15.0)
MCH: 28.7 pg (ref 26.0–34.0)
MCHC: 33.9 g/dL (ref 30.0–36.0)
MCV: 84.5 fL (ref 78.0–100.0)
Platelets: 348 10*3/uL (ref 150–400)
RBC: 4.78 MIL/uL (ref 3.87–5.11)
RDW: 13.4 % (ref 11.5–15.5)
WBC: 9.6 10*3/uL (ref 4.0–10.5)

## 2016-08-25 LAB — I-STAT CG4 LACTIC ACID, ED
LACTIC ACID, VENOUS: 4.03 mmol/L — AB (ref 0.5–1.9)
LACTIC ACID, VENOUS: 2.3 mmol/L — AB (ref 0.5–1.9)
Lactic Acid, Venous: 2.62 mmol/L (ref 0.5–1.9)

## 2016-08-25 LAB — URINALYSIS, ROUTINE W REFLEX MICROSCOPIC
BACTERIA UA: NONE SEEN
Bilirubin Urine: NEGATIVE
Glucose, UA: >=500 mg/dL — AB
Ketones, ur: NEGATIVE mg/dL
Leukocytes, UA: NEGATIVE
Nitrite: NEGATIVE
Protein, ur: 100 mg/dL — AB
SPECIFIC GRAVITY, URINE: 1.029 (ref 1.005–1.030)
pH: 5 (ref 5.0–8.0)

## 2016-08-25 LAB — I-STAT BETA HCG BLOOD, ED (MC, WL, AP ONLY): I-stat hCG, quantitative: <5 m[IU]/mL (ref <5)

## 2016-08-25 LAB — LIPASE, BLOOD: Lipase: 27 U/L (ref 11–51)

## 2016-08-25 MED ORDER — SODIUM CHLORIDE 0.9 % IV BOLUS (SEPSIS)
1000.0000 mL | Freq: Once | INTRAVENOUS | Status: AC
  Administered 2016-08-25: 1000 mL via INTRAVENOUS

## 2016-08-25 MED ORDER — SUCRALFATE 1 G PO TABS: 1.0000 g | ORAL_TABLET | Freq: Three times a day (TID) | ORAL | 0 refills | Status: DC

## 2016-08-25 MED ORDER — SODIUM CHLORIDE 0.9 % IV BOLUS (SEPSIS): 1000.0000 mL | Freq: Once | INTRAVENOUS | Status: DC

## 2016-08-25 MED ORDER — IOPAMIDOL (ISOVUE-300) INJECTION 61%
INTRAVENOUS | Status: AC
  Administered 2016-08-25: 100 mL
  Filled 2016-08-25: qty 100

## 2016-08-25 MED ORDER — LIDOCAINE VISCOUS 2 % MT SOLN
15.0000 mL | Freq: Once | OROMUCOSAL | Status: AC
  Administered 2016-08-25: 15 mL via OROMUCOSAL
  Filled 2016-08-25: qty 15

## 2016-08-25 MED ORDER — ONDANSETRON 4 MG PO TBDP: 4.0000 mg | ORAL_TABLET | Freq: Three times a day (TID) | ORAL | 0 refills | Status: DC | PRN

## 2016-08-25 MED ORDER — MORPHINE SULFATE (PF) 4 MG/ML IV SOLN
4.0000 mg | Freq: Once | INTRAVENOUS | Status: AC
  Administered 2016-08-25: 4 mg via INTRAVENOUS
  Filled 2016-08-25: qty 1

## 2016-08-25 MED ORDER — ONDANSETRON HCL 4 MG/2ML IJ SOLN
4.0000 mg | Freq: Once | INTRAMUSCULAR | Status: AC
  Administered 2016-08-25: 4 mg via INTRAVENOUS
  Filled 2016-08-25: qty 2

## 2016-08-25 MED ORDER — ALUM & MAG HYDROXIDE-SIMETH 200-200-20 MG/5ML PO SUSP
30.0000 mL | Freq: Once | ORAL | Status: AC
  Administered 2016-08-25: 30 mL via ORAL
  Filled 2016-08-25: qty 30

## 2016-08-25 NOTE — ED Notes (Signed)
Reported to DR. V Nagpal that pt.s abdominal pain is coming back and is a 4/10

## 2016-08-25 NOTE — ED Provider Notes (Signed)
[facility] DEPT Provider Note   CSN: [id number] Arrival date & time: 08/25/16  1355     History   Chief Complaint Chief Complaint  Patient presents with  . Abdominal Pain    left side  . Emesis    HPI [name] is a 40 y.o. female.   The history is provided by the patient, the spouse and medical records. No language interpreter was used.  Abdominal Pain   This is a new problem. The current episode started more than 2 days ago. The problem occurs constantly. The problem has been gradually worsening. The pain is located in the LUQ. The quality of the pain is sharp and cramping. The pain is severe. Associated symptoms include diarrhea (at baseline, states not worsened), nausea and vomiting (states looks like stomach content). Pertinent negatives include fever, hematochezia, dysuria and hematuria. The symptoms are aggravated by palpation. Nothing relieves the symptoms. Her past medical history is significant for gallstones (s/p cholecystectomy).    Past Medical History:  Diagnosis Date  . Cushing's disease ([place])   . Diabetes ([place])   . Hyperlipidemia   . Hyperlipidemia   . Hypertension   . Morbid obesity ([place])   . Osteoporosis 07/19/2015  . 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 Uncontrolled diabetes mellitus with diabetic neuropathy, with long-term current use of insulin ([place]) 07/16/2015  . Vitamin D deficiency 07/19/2015    Patient Active Problem List   Diagnosis Date Noted  . Hypertriglyceridemia 07/22/2015  . Diabetes mellitus due to underlying condition, uncontrolled, with diabetic neuropathy, with long-term current use of insulin ([place])   . Vitamin D deficiency 07/19/2015  . 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 Osteoporosis 07/19/2015  . 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 Fracture, tibial plateau  07/16/2015  . Uncontrolled diabetes mellitus with diabetic neuropathy, with long-term current use of insulin ([place]) 07/16/2015  . Leukocytosis 07/16/2015  . Essential hypertension 07/16/2015  . Morbid obesity ([place]) 04/20/2014  . Hyperlipidemia 07/31/2012  . Cushing disease ([place]) 07/31/2012  . Hypothyroid 07/31/2012  . 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 ANTERIOR TALOFIBULAR LIGAMENT REPAIR Left 11/15/2014   Procedure: ANTERIOR TALOFIBULAR LIGAMENT REPAIR;  Surgeon: Jana Half, DPM;  Location: [facility];  Service: Podiatry;  Laterality: Left;  . CESAREAN SECTION  dec 1997/  06-03-2001/   01-01-2005   BILATERAL TUBAL LIGATION WITH LAST ONE  . DILATION AND CURETTAGE OF UTERUS  1995   WITH SUCTION  . LAPAROSCOPIC CHOLECYSTECTOMY  09-25-2005  . ORIF TIBIA PLATEAU Right 07/19/2015   Procedure: OPEN REDUCTION INTERNAL FIXATION (ORIF) RIGHT TIBIAL PLATEAU;  Surgeon: Altamese P[place]all[place], MD;  Location: [facility];  Service: Orthopedics;  Laterality: Right;  . PARTIAL KNEE ARTHROPLASTY Right 04/19/2014   Procedure: RIGHT UNI KNEE ARTHROPLASTY MEDIALLY ;  Surgeon: Mauri Pole, MD;  Location: [facility];  Service: Orthopedics;  Laterality: Right;    OB History    No data available       Home Medications    Prior to Admission medications   Medication Sig Start Date End Date Taking? Authorizing Provider  acetaminophen (TYLENOL) 325 MG tablet Take 2 tablets (650 mg total) by mouth every 6 (six) hours as needed for mild pain (or Fever >/= 101). 07/21/15  Yes Delfina Redwood, MD  buPROPion (  WELLBUTRIN XL) 150 MG 24 hr tablet Take 150 mg by mouth daily.   Yes Historical Provider, MD  Cetirizine HCl (ZYRTEC ALLERGY) 10 MG CAPS Take 1 capsule (10 mg total) by mouth daily. Patient taking differently: Take 10 mg by mouth daily as needed (allergies).  12/12/14  Yes Tanna Furry, MD  cholecalciferol 1000 UNITS  tablet Take 1 tablet (1,000 Units total) by mouth 2 (two) times daily. Patient taking differently: Take 1,000-5,000 Units by mouth daily. Take 5000 units for the first week then 1000 units 07/21/15  Yes Delfina Redwood, MD  cholestyramine (QUESTRAN) 4 g packet TAKE 1 PACKET (4 G TOTAL) BY MOUTH 2 (TWO) TIMES DAILY WITH A MEAL. 04/09/16  Yes [name], MD  cyclobenzaprine (FLEXERIL) 10 MG tablet Take 1 tablet (10 mg total) by mouth 2 (two) times daily as needed for muscle spasms. 01/15/16  Yes Montine Circle, PA-C  Exenatide ER (BYDUREON) 2 MG PEN Inject 2 mg into the skin once a week.   Yes Historical Provider, MD  fenofibrate (TRICOR) 48 MG tablet Take 48 mg by mouth daily.   Yes Historical Provider, MD  fluticasone (FLONASE) 50 MCG/ACT nasal spray 1 spray each nares bid Patient taking differently: Place 1-2 sprays into both nostrils daily as needed for allergies. 1 spray each nares bid 12/12/14  Yes Tanna Furry, MD  gabapentin (NEURONTIN) 300 MG capsule Take 600 mg by mouth 2 (two) times daily.    Yes Historical Provider, MD  gemfibrozil (LOPID) 600 MG tablet Take 600 mg by mouth 2 (two) times daily before a meal.   Yes Historical Provider, MD  HYDROcodone-acetaminophen (NORCO) 7.5-325 MG tablet Take 1-2 tablets by mouth every 6 (six) hours as needed for moderate pain or severe pain. 07/21/15  Yes Delfina Redwood, MD  hydrOXYzine (VISTARIL) 50 MG capsule Take 100 mg by mouth at bedtime. sleep   Yes Historical Provider, MD  insulin detemir (LEVEMIR) 100 unit/ml SOLN Inject 50 Units into the skin every morning.   Yes Historical Provider, MD  insulin NPH-regular Human (NOVOLIN 70/30) (70-30) 100 UNIT/ML injection Inject 28 Units into the skin 2 (two) times daily with a meal. Patient taking differently: Inject 25 Units into the skin at bedtime.  07/21/15  Yes Delfina Redwood, MD  levothyroxine (SYNTHROID, LEVOTHROID) 75 MCG tablet Take 1 tablet (75 mcg total) by mouth every morning. 07/21/15   Yes Delfina Redwood, MD  meclizine (ANTIVERT) 25 MG tablet Take 25 mg by mouth at bedtime.   Yes Historical Provider, MD  pioglitazone (ACTOS) 45 MG tablet Take 45 mg by mouth daily.   Yes Historical Provider, MD  pravastatin (PRAVACHOL) 80 MG tablet Take 80 mg by mouth daily.   Yes Historical Provider, MD  quinapril (ACCUPRIL) 5 MG tablet Take 2.5 mg by mouth every morning.    Yes Historical Provider, MD  traZODone (DESYREL) 50 MG tablet Take 50 mg by mouth at bedtime.   Yes Historical Provider, MD  venlafaxine XR (EFFEXOR-XR) 37.5 MG 24 hr capsule Take 37.5-75 mg by mouth 2 (two) times daily. Take 2 capsules= 75mg  in the morning and take 1 capsule = 37.5mg  at bedtime   Yes Historical Provider, MD  ondansetron (ZOFRAN ODT) 4 MG disintegrating tablet Take 1 tablet (4 mg total) by mouth every 8 (eight) hours as needed for nausea or vomiting. 08/25/16   Theodosia Quay, MD  sucralfate (CARAFATE) 1 g tablet Take 1 tablet (1 g total) by mouth 4 (four) times daily -  with meals and at bedtime. 08/25/16   Theodosia Quay, MD    Family History Family History  Problem Relation Age of Onset  . Adopted: Yes  . Autism Son   . ADD / ADHD Son   . Apraxia Son     Social History Social History  Substance Use Topics  . Smoking status: Current Every Day Smoker    Years: 4.00    Types: Cigarettes  . Smokeless tobacco: Never Used  . Alcohol use Yes     Comment: RARE     Allergies   Penicillins   Review of Systems Review of Systems  Constitutional: Positive for appetite change (decreased). Negative for chills and fever.  HENT: Negative for congestion and rhinorrhea.   Respiratory: Negative for shortness of breath and wheezing.   Gastrointestinal: Positive for abdominal pain, diarrhea (at baseline, states not worsened), nausea and vomiting (states looks like stomach content). Negative for hematochezia.  Genitourinary: Negative for dysuria and hematuria.  Skin: Negative for pallor and rash.    Neurological: Positive for light-headedness (associated with this illness). Negative for weakness and numbness.  Psychiatric/Behavioral: Negative for agitation and confusion.  All other systems reviewed and are negative.    Physical Exam Updated Vital Signs BP 122/79   Pulse 102   Temp 97.5 F (36.4 C) (Oral)   Resp 16   Ht 5\' 2"  (1.575 m)   Wt (!) 137 kg   LMP 08/22/2016   SpO2 97%   BMI 55.24 kg/m   Physical Exam  Constitutional: She is oriented to person, place, and time.  Appears uncomfortable  HENT:  Head: Normocephalic and atraumatic.  Eyes: Conjunctivae and EOM are normal.  Neck: Normal range of motion. Neck supple.  Cardiovascular: Regular rhythm.  Tachycardia present.   Pulmonary/Chest: Effort normal and breath sounds normal. No respiratory distress.  Abdominal: Soft. She exhibits distension. There is tenderness (moderate LUQ ttp). A hernia (umbilical, easily reducible ) is present.  Musculoskeletal: Normal range of motion. She exhibits no edema.  Neurological: She is alert and oriented to person, place, and time.  Skin: Skin is warm and dry. She is not diaphoretic.  Nursing note and vitals reviewed.    ED Treatments / Results  Labs (all labs ordered are listed, but only abnormal results are displayed) Labs Reviewed  COMPREHENSIVE METABOLIC PANEL - Abnormal; Notable for the following:       Result Value   Glucose, Bld 370 (*)    Albumin 2.9 (*)    All other components within normal limits  URINALYSIS, ROUTINE W REFLEX MICROSCOPIC - Abnormal; Notable for the following:    APPearance HAZY (*)    Glucose, UA >=500 (*)    Hgb urine dipstick LARGE (*)    Protein, ur 100 (*)    Squamous Epithelial / LPF 0-5 (*)    All other components within normal limits  I-STAT CG4 LACTIC ACID, ED - Abnormal; Notable for the following:    Lactic Acid, Venous 4.03 (*)    All other components within normal limits  I-STAT CG4 LACTIC ACID, ED - Abnormal; Notable for the  following:    Lactic Acid, Venous 2.30 (*)    All other components within normal limits  I-STAT CG4 LACTIC ACID, ED - Abnormal; Notable for the following:    Lactic Acid, Venous 2.62 (*)    All other components within normal limits  LIPASE, BLOOD  CBC  I-STAT BETA HCG BLOOD, ED ([facility], [facility], [facility] ONLY)  I-STAT CG4 LACTIC  ACID, ED    EKG  EKG Interpretation  Date/Time:  Saturday August 25 2016 16:18:07 EST Ventricular Rate:  100 PR Interval:    QRS Duration: 86 QT Interval:  340 QTC Calculation: 439 R Axis:   37 Text Interpretation:  Sinus tachycardia Low voltage, precordial leads Non-specific intra-ventricular conduction delay Borderline ECG Confirmed by Carmin Muskrat  MD [phone]) on 08/25/2016 5:32:34 PM       Radiology Ct Abdomen Pelvis W Contrast  Result Date: 08/25/2016 CLINICAL DATA:  Left upper quadrant pain for 2 days. EXAM: CT ABDOMEN AND PELVIS WITH CONTRAST TECHNIQUE: Multidetector CT imaging of the abdomen and pelvis was performed using the standard protocol following bolus administration of intravenous contrast. CONTRAST:  164mL ISOVUE-300 IOPAMIDOL (ISOVUE-300) INJECTION 61% COMPARISON:  December 16, 2007 FINDINGS: Lower chest: No acute abnormality. Hepatobiliary: Low attenuation is seen in the caudate on series 2, image 19. This region was not as well assessed on previous studies. I doubt this is of acute significance and is most likely focal fatty deposition. The liver and portal vein are otherwise unremarkable. The gallbladder is been surgically removed. Pancreas: Unremarkable. No pancreatic ductal dilatation or surrounding inflammatory changes. Spleen: Normal in size without focal abnormality. Adrenals/Urinary Tract: Adrenal glands are unremarkable. Kidneys are normal, without renal calculi, focal lesion, or hydronephrosis. Bladder is unremarkable. Stomach/Bowel: The stomach and small bowel are normal. The colon and appendix are unremarkable. Vascular/Lymphatic: The abdominal  aorta is normal in appearance. Mild atherosclerotic changes seen in the iliac vessels. No aneurysm or dissection. No adenopathy. Reproductive: The left ovary is mildly prominent compared to the right but unchanged since 2009 of doubtful significance. No suspicious mass identified. The uterus and right ovary are normal. Other: No abdominal wall hernia or abnormality. No abdominopelvic ascites. Musculoskeletal: No acute or significant osseous findings. IMPRESSION: 1. No acute abnormality to explain left upper quadrant abdominal pain. 2. Low-attenuation in the caudate is probably focal fatty deposition. 3. No other acute abnormalities. Electronically Signed   By: Dorise Bullion III M.D   On: 08/25/2016 19:15    Procedures Procedures (including critical care time)  Medications Ordered in ED Medications  sodium chloride 0.9 % bolus 1,000 mL (0 mLs Intravenous Stopped 08/25/16 1905)  ondansetron (ZOFRAN) injection 4 mg (4 mg Intravenous Given 08/25/16 1617)  morphine 4 MG/ML injection 4 mg (4 mg Intravenous Given 08/25/16 1617)  iopamidol (ISOVUE-300) 61 % injection (100 mLs  Contrast Given 08/25/16 1830)  morphine 4 MG/ML injection 4 mg (4 mg Intravenous Given 08/25/16 1936)  lidocaine (XYLOCAINE) 2 % viscous mouth solution 15 mL (15 mLs Mouth/Throat Given 08/25/16 1936)  alum & mag hydroxide-simeth (MAALOX/MYLANTA) 200-200-20 MG/5ML suspension 30 mL (30 mLs Oral Given 08/25/16 1936)     Initial Impression / Assessment and Plan / ED Course  I have reviewed the triage vital signs and the nursing notes.  Pertinent labs & imaging results that were available during my care of the patient were reviewed by me and considered in my medical decision making (see chart for details).  Clinical Course     Patient is a 40 year old female who presents today with severe left upper quadrant pain. On my exam the patient did have marked tenderness in the left upper quadrant. Also noted that her lactic acid on  i-STAT lab was elevated to 4. She was given IV fluids for hydration and I proceeded with symptomatic management with IV pain medication and nausea medication while we obtained a CT of her abdomen and pelvis  with contrast.  On evaluation of the CT, there is no acute findings and would explain the patient's abdominal pain. I considered that this could possibly be secondary to infectious circumstances. Additionally gastric irritation or ulcers could be contributing as well. Discussed following up with gastroenterologist for further evaluation and the patient is agreeable with this. I will discharge her home with Zofran and Carafate for symptom management. Her lactate was rechecked after IV fluids and it is trending down. She is noted to be eating and drinking at the end of the exam and I feel that she will be able to tolerate by mouth on discharge.  Patient was in good condition at the time of discharge.  Final Clinical Impressions(s) / ED Diagnoses   Final diagnoses:  Non-intractable vomiting with nausea, unspecified vomiting type  Dehydration    New Prescriptions Discharge Medication List as of 08/25/2016  8:47 PM    START taking these medications   Details  ondansetron (ZOFRAN ODT) 4 MG disintegrating tablet Take 1 tablet (4 mg total) by mouth every 8 (eight) hours as needed for nausea or vomiting., Starting Sat 08/25/2016, Print    sucralfate (CARAFATE) 1 g tablet Take 1 tablet (1 g total) by mouth 4 (four) times daily -  with meals and at bedtime., Starting Sat 08/25/2016, Print         Theodosia Quay, MD 08/26/16 0023    Carmin Muskrat, MD 08/26/16 0030

## 2016-08-25 NOTE — ED Notes (Signed)
Pt tolerated sandwich, ready to be discharged

## 2016-08-25 NOTE — ED Notes (Signed)
Asked pt to give [place] a urine sample, pt states that she can not go now.  No acute distress, pt is in waiting area with family member.

## 2016-08-25 NOTE — ED Notes (Signed)
Pt. Returned from the Ct. Scan

## 2016-08-25 NOTE — ED Triage Notes (Signed)
Pt. Stated, I've had abdominal pain for 2 months and it radiates to the left. , for the last 2 days I've had vomiting. I went to my doctor on Wednesday and he just blew me off.

## 2016-08-27 ENCOUNTER — Ambulatory Visit: Admit: 2016-08-27 | Payer: BLUE CROSS/BLUE SHIELD

## 2016-08-27 ENCOUNTER — Ambulatory Visit: Admit: 2016-08-27 | Discharge: 2016-08-27 | Payer: BLUE CROSS/BLUE SHIELD | Attending: Orthopaedic Surgery

## 2016-08-27 ENCOUNTER — Ambulatory Visit: Payer: BLUE CROSS/BLUE SHIELD

## 2016-08-27 ENCOUNTER — Inpatient Hospital Stay: Admit: 2016-08-30 | Attending: Rehabilitative and Restorative Service Providers"

## 2016-08-27 DIAGNOSIS — M778 Other enthesopathies, not elsewhere classified: Secondary | ICD-10-CM

## 2016-08-27 MED ORDER — DICLOFENAC SODIUM 75 MG PO TBEC
75 MG | ORAL_TABLET | Freq: Two times a day (BID) | ORAL | 3 refills | Status: AC
Start: 2016-08-27 — End: ?
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
## 2016-08-27 NOTE — Patient Instructions (Signed)
Patient was provided with instructions regarding their diagnosis and treatment today.  They voiced understanding of the treatment plan and were instructed on when to return to the clinic.

## 2016-08-30 ENCOUNTER — Encounter (HOSPITAL_COMMUNITY): Payer: Self-pay | Admitting: Emergency Medicine

## 2016-08-30 ENCOUNTER — Emergency Department (HOSPITAL_COMMUNITY)
Admission: EM | Admit: 2016-08-30 | Discharge: 2016-08-31 | Disposition: A | Discharge status: Home / Self Care | Payer: Medicaid Other | Attending: Emergency Medicine | Admitting: Emergency Medicine

## 2016-08-30 ENCOUNTER — Encounter: Payer: BLUE CROSS/BLUE SHIELD | Attending: Orthopaedic Surgery

## 2016-08-30 DIAGNOSIS — E1140 Type 2 diabetes mellitus with diabetic neuropathy, unspecified: Secondary | ICD-10-CM | POA: Insufficient documentation

## 2016-08-30 DIAGNOSIS — I10 Essential (primary) hypertension: Secondary | ICD-10-CM | POA: Insufficient documentation

## 2016-08-30 DIAGNOSIS — F17210 Nicotine dependence, cigarettes, uncomplicated: Secondary | ICD-10-CM | POA: Diagnosis not present

## 2016-08-30 DIAGNOSIS — E039 Hypothyroidism, unspecified: Secondary | ICD-10-CM | POA: Diagnosis not present

## 2016-08-30 DIAGNOSIS — R1012 Left upper quadrant pain: Secondary | ICD-10-CM | POA: Diagnosis present

## 2016-08-30 LAB — CBC WITH DIFFERENTIAL/PLATELET
BASOS ABS: 0 10*3/uL (ref 0.0–0.1)
BASOS PCT: 0 %
EOS PCT: 1 %
Eosinophils Absolute: 0.1 10*3/uL (ref 0.0–0.7)
HCT: 39.6 % (ref 36.0–46.0)
Hemoglobin: 13.2 g/dL (ref 12.0–15.0)
LYMPHS PCT: 26 %
Lymphs Abs: 2 10*3/uL (ref 0.7–4.0)
MCH: 28.6 pg (ref 26.0–34.0)
MCHC: 33.3 g/dL (ref 30.0–36.0)
MCV: 85.9 fL (ref 78.0–100.0)
MONO ABS: 0.3 10*3/uL (ref 0.1–1.0)
MONOS PCT: 4 %
Neutro Abs: 5.3 10*3/uL (ref 1.7–7.7)
Neutrophils Relative %: 69 %
PLATELETS: 315 10*3/uL (ref 150–400)
RBC: 4.61 MIL/uL (ref 3.87–5.11)
RDW: 13.9 % (ref 11.5–15.5)
WBC: 7.7 10*3/uL (ref 4.0–10.5)

## 2016-08-30 LAB — LIPASE, BLOOD: Lipase: 26 U/L (ref 11–51)

## 2016-08-30 LAB — URINALYSIS, ROUTINE W REFLEX MICROSCOPIC
BACTERIA UA: NONE SEEN
Bilirubin Urine: NEGATIVE
Glucose, UA: >=500 mg/dL — AB
Ketones, ur: NEGATIVE mg/dL
Leukocytes, UA: NEGATIVE
Nitrite: NEGATIVE
PH: 6 (ref 5.0–8.0)
Protein, ur: 100 mg/dL — AB
SPECIFIC GRAVITY, URINE: 1.023 (ref 1.005–1.030)

## 2016-08-30 LAB — COMPREHENSIVE METABOLIC PANEL
ALBUMIN: 2.9 g/dL — AB (ref 3.5–5.0)
ALK PHOS: 71 U/L (ref 38–126)
ALT: 24 U/L (ref 14–54)
AST: 24 U/L (ref 15–41)
Anion gap: 11 (ref 5–15)
BILIRUBIN TOTAL: 0.3 mg/dL (ref 0.3–1.2)
BUN: 7 mg/dL (ref 6–20)
CALCIUM: 9.1 mg/dL (ref 8.9–10.3)
CO2: 25 mmol/L (ref 22–32)
Chloride: 102 mmol/L (ref 101–111)
Creatinine, Ser: 0.67 mg/dL (ref 0.44–1.00)
GFR calc Af Amer: >60 mL/min (ref >60)
GFR calc non Af Amer: >60 mL/min (ref >60)
GLUCOSE: 256 mg/dL — AB (ref 65–99)
Potassium: 3.3 mmol/L — ABNORMAL LOW (ref 3.5–5.1)
Sodium: 138 mmol/L (ref 135–145)
TOTAL PROTEIN: 6.3 g/dL — AB (ref 6.5–8.1)

## 2016-08-30 MED ORDER — POTASSIUM CHLORIDE CRYS ER 20 MEQ PO TBCR
20.0000 meq | EXTENDED_RELEASE_TABLET | Freq: Once | ORAL | Status: AC
  Administered 2016-08-30: 20 meq via ORAL
  Filled 2016-08-30: qty 1

## 2016-08-30 MED ORDER — SODIUM CHLORIDE 0.9 % IV BOLUS (SEPSIS): 1000.0000 mL | Freq: Once | INTRAVENOUS | Status: DC

## 2016-08-30 MED ORDER — HYDROMORPHONE HCL 2 MG/ML IJ SOLN
2.0000 mg | Freq: Once | INTRAMUSCULAR | Status: AC
  Administered 2016-08-30: 2 mg via INTRAMUSCULAR
  Filled 2016-08-30: qty 1

## 2016-08-30 MED ORDER — MORPHINE SULFATE (PF) 4 MG/ML IV SOLN: 4.0000 mg | Freq: Once | INTRAVENOUS | Status: DC

## 2016-08-30 NOTE — ED Provider Notes (Signed)
Emergency Department Provider Note   I have reviewed the triage vital signs and the nursing notes.   HISTORY  Chief Complaint Flank Pain   HPI [name] is a 40 y.o. female with past medical history is as listed below came to ED with worsening left upper quadrant abdominal pain for 1 week. She was seen in ED few days ago with the same complaint, was given Zofran and Carafate after negative CT abdomen, and was told to follow up with GI. Patient states that her GI appointment is in first week of January. Her pain was getting worse associated with mild nausea and vomiting. She denies any fever or chills ,denies chest or shortness of breath. She denies any change in her bowel habits, no dysuria or hematuria.    Past Medical History:  Diagnosis Date  . Cushing's disease ([place])   . Diabetes ([place])   . Hyperlipidemia   . Hyperlipidemia   . Hypertension   . Morbid obesity ([place])   . Osteoporosis 07/19/2015  . 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 Uncontrolled diabetes mellitus with diabetic neuropathy, with long-term current use of insulin ([place]) 07/16/2015  . Vitamin D deficiency 07/19/2015    Patient Active Problem List   Diagnosis Date Noted  . Hypertriglyceridemia 07/22/2015  . Diabetes mellitus due to underlying condition, uncontrolled, with diabetic neuropathy, with long-term current use of insulin ([place])   . Vitamin D deficiency 07/19/2015  . 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 Osteoporosis 07/19/2015  . 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 Fracture, tibial plateau 07/16/2015  . Uncontrolled diabetes mellitus with diabetic neuropathy, with long-term current use of insulin ([place]) 07/16/2015  . Leukocytosis 07/16/2015  . Essential hypertension 07/16/2015  . Morbid obesity ([place]) 04/20/2014  . Hyperlipidemia  07/31/2012  . Cushing disease ([place]) 07/31/2012  . Hypothyroid 07/31/2012  . Major depressive disorder, recurrent episode, mild ([place]) 04/08/2012    Past Surgical History:  Procedure Laterality Date  . ANTERIOR TALOFIBULAR LIGAMENT REPAIR Left 11/15/2014   Procedure: ANTERIOR TALOFIBULAR LIGAMENT REPAIR;  Surgeon: Jana Half, DPM;  Location: [facility];  Service: Podiatry;  Laterality: Left;  . CESAREAN SECTION  dec 1997/  06-03-2001/   01-01-2005   BILATERAL TUBAL LIGATION WITH LAST ONE  . DILATION AND CURETTAGE OF UTERUS  1995   WITH SUCTION  . LAPAROSCOPIC CHOLECYSTECTOMY  09-25-2005  . ORIF TIBIA PLATEAU Right 07/19/2015   Procedure: OPEN REDUCTION INTERNAL FIXATION (ORIF) RIGHT TIBIAL PLATEAU;  Surgeon: Altamese Victoria, MD;  Location: [facility];  Service: Orthopedics;  Laterality: Right;  . PARTIAL KNEE ARTHROPLASTY Right 04/19/2014   Procedure: RIGHT UNI KNEE ARTHROPLASTY MEDIALLY ;  Surgeon: Mauri Pole, MD;  Location: [facility];  Service: Orthopedics;  Laterality: Right;    Current Outpatient Rx  . Order #: [id number] Class: OTC  . Order #: [id number] Class: Historical Med  . Order #: [id number] Class: Print  . Order #: [id number] Class: No Print  . Order #: [id number] Class: Normal  . Order #: [id number] Class: Print  . Order #: [id number] Class: Historical Med  . Order #: [id number] Class: Historical Med  . Order #: [id number] Class: Print  . Order #: [id number] Class: Historical Med  . Order #: [id number] Class: Historical Med  . Order #: [id number] Class: Print  . Order #: [id number] Class: Historical Med  . Order #: [id number] Class: Historical Med  . Order #: [id number] Class: No Print  .  Order #: [id number] Class: No Print  . Order #: [id number] Class: Historical Med  . Order #: [id number] Class: Print  . Order #: [id number] Class: Historical Med  . Order #: [id number] Class: Historical Med  . Order #: [id number] Class: Historical Med  . Order #: [id number] Class: Print  . Order #:  [id number] Class: Historical Med  . Order #: [id number] Class: Historical Med    Allergies Penicillins  Family History  Problem Relation Age of Onset  . Adopted: Yes  . Autism Son   . ADD / ADHD Son   . Apraxia Son     Social History Social History  Substance Use Topics  . Smoking status: Current Every Day Smoker    Years: 4.00    Types: Cigarettes  . Smokeless tobacco: Never Used  . Alcohol use Yes     Comment: RARE    Review of Systems Constitutional: No fever/chills Eyes: No visual changes. ENT: No sore throat. Cardiovascular: Denies chest pain. Respiratory: Denies shortness of breath. Gastrointestinal:  LUQ abdominal pain.   nausea,  vomiting.  No diarrhea.  No constipation. Genitourinary: Negative for dysuria. Musculoskeletal: Negative for back pain. Skin: Negative for rash. Neurological: Negative for headaches, focal weakness or numbness. 10-point ROS otherwise negative.  ____________________________________________   PHYSICAL EXAM:  VITAL SIGNS: ED Triage Vitals  Enc Vitals Group     BP 08/30/16 2200 172/80     Pulse Rate 08/30/16 2200 106     Resp 08/30/16 2200 22     Temp 08/30/16 2200 97.8 F (36.6 C)     Temp Source 08/30/16 2200 Oral     SpO2 08/30/16 2200 100 %     Weight 08/30/16 2209 (!) 304 lb (137.9 kg)     Height 08/30/16 2209 5\' 2"  (1.575 m)     Head Circumference --      Peak Flow --      Pain Score 08/30/16 2209 8     Pain Loc --      Pain Edu? --      Excl. in [place]? --    Constitutional: Alert and oriented. Morbidly obese and in no acute distress. Eyes: Conjunctivae are normal. PERRL. EOMI. Head: Atraumatic. Mouth/Throat: Mucous membranes are moist.  Oropharynx non-erythematous. Neck: No stridor.  No meningeal signs.  Cardiovascular: Normal rate, regular rhythm. Good peripheral circulation. Grossly normal heart sounds.   Respiratory: Normal respiratory effort.  No retractions. Lungs CTAB. Gastrointestinal: Soft, obese, tender  epigastric and left upper quadrant, no guarding or rebound, bowel sounds positive. Musculoskeletal: No lower extremity tenderness nor edema. No gross deformities of extremities. Neurologic:  Normal speech and language. No gross focal neurologic deficits are appreciated.  Skin:  Skin is warm, dry and intact. No rash noted. ____________________________________________   LABS (all labs ordered are listed, but only abnormal results are displayed)  Labs Reviewed  COMPREHENSIVE METABOLIC PANEL - Abnormal; Notable for the following:       Result Value   Potassium 3.3 (*)    Glucose, Bld 256 (*)    Total Protein 6.3 (*)    Albumin 2.9 (*)    All other components within normal limits  LIPASE, BLOOD  CBC WITH DIFFERENTIAL/PLATELET  URINALYSIS, ROUTINE W REFLEX MICROSCOPIC   ____________________________________________  EKG  None ____________________________________________  RADIOLOGY  No results found.  ____________________________________________   PROCEDURES  Procedure(s) performed:   Procedures   ____________________________________________   INITIAL IMPRESSION / ASSESSMENT AND PLAN / ED COURSE  Pertinent labs & imaging results that were available during my care of  the patient were reviewed by me and considered in my medical decision making (see chart for details).  my L Roark is a 40 y.o. female with past medical history is as listed below came to ED with worsening left upper quadrant abdominal pain for 1 week. She was seen in ED few days ago with the same complaint, was given Zofran and Carafate after negative CT abdomen, and was told to follow up with GI. Patient states that her GI appointment is in first week of January. Her pain was getting worse associated with mild nausea and vomiting. She states that her nausea and vomiting is Little as compared before.  As her CBC, CMP and lipase are within normal limit, except mildly decreased potassium at 3.3, which was  replaced.UA is normal without any infection. She was given Dilaudid 2 mg IM for pain.  As her pain is more consistent with musculoskeletal in origin.  As she is being discharged home. She was advised to keep up her appointment with GI.  ____________________________________________  FINAL CLINICAL IMPRESSION(S) / ED DIAGNOSES  Left upper quadrant pain/musculoskeletal   MEDICATIONS GIVEN DURING THIS VISIT:  Medications  HYDROmorphone (DILAUDID) injection 2 mg (not administered)  potassium chloride SA (K-DUR,KLOR-CON) CR tablet 20 mEq (not administered)    NEW OUTPATIENT MEDICATIONS STARTED DURING THIS VISIT:  New Prescriptions   No medications on file      Note:  This document was prepared using Dragon voice recognition software and may include unintentional dictation errors.  Emergency Medicine   Lorella Nimrod, MD 08/30/16 [id number]    Lorella Nimrod, MD 08/31/16 [id number]    Veryl Speak, MD 09/01/16 2120

## 2016-08-30 NOTE — ED Triage Notes (Signed)
Pt states she has been having a pain in her left flank/abdomen for over 1 week. She describes it as a twisting sensation that has progressively gotten worse. She states she was seen 1 week ago for same symptoms.
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
## 2016-08-31 MED ORDER — HYDROCODONE-ACETAMINOPHEN 5-325 MG PO TABS: 2.0000 | ORAL_TABLET | ORAL | 0 refills | Status: DC | PRN

## 2016-08-31 NOTE — Discharge Instructions (Signed)
Thank you for visiting ED today. You might be having some muscle cramps causing this pain, as your blood workup and urine is normal. You can try using some heating pad to ease off your pain. You are given a small prescription of pain medicine you can use it conservative my years failed to ease the pain. Please follow-up with gastroenterologist according to your scheduled appointment.

## 2016-09-05 MED ORDER — METHYLPREDNISOLONE 4 MG PO TBPK
4 MG | PACK | ORAL | 0 refills | Status: AC
Start: 2016-09-05 — End: 2016-09-11

## 2016-09-07 ENCOUNTER — Encounter: Attending: Rehabilitative and Restorative Service Providers"

## 2016-09-11 ENCOUNTER — Ambulatory Visit (INDEPENDENT_AMBULATORY_CARE_PROVIDER_SITE_OTHER): Payer: Medicaid Other | Admitting: Gastroenterology

## 2016-09-11 ENCOUNTER — Encounter: Payer: Self-pay | Admitting: Gastroenterology

## 2016-09-11 VITALS — BP 110/70 | HR 84 | Ht 62.0 in | Wt 321.0 lb

## 2016-09-11 DIAGNOSIS — R112 Nausea with vomiting, unspecified: Secondary | ICD-10-CM

## 2016-09-11 DIAGNOSIS — R1012 Left upper quadrant pain: Secondary | ICD-10-CM

## 2016-09-11 DIAGNOSIS — R1013 Epigastric pain: Secondary | ICD-10-CM

## 2016-09-11 MED ORDER — PANTOPRAZOLE SODIUM 40 MG PO TBEC: 40.0000 mg | DELAYED_RELEASE_TABLET | Freq: Two times a day (BID) | ORAL | 3 refills | Status: DC

## 2016-09-11 NOTE — Patient Instructions (Signed)
We have sent the following medications to your pharmacy for you to pick up at your convenience: Pantoprazole 40 mg twice a day  You have been scheduled for an endoscopy. Please follow written instructions given to you at your visit today. If you use inhalers (even only as needed), please bring them with you on the day of your procedure. Your physician has requested that you go to www.startemmi.com and enter the access code given to you at your visit today. This web site gives a general overview about your procedure. However, you should still follow specific instructions given to you by our office regarding your preparation for the procedure.

## 2016-09-11 NOTE — Progress Notes (Signed)
09/11/2016 [name] [id number] 1976-03-25   HISTORY OF PRESENT ILLNESS:  This is a 41 year old female who is known to Dr. Ardis Hughs in 2016 for complaints of diarrhea. She uses [facility] for this issue. She presents to our office today with complaints of epigastric and left upper quadrant abdominal pain with associated nausea and a couple episodes of vomiting. She says that these issues began a couple of months ago and initially would come and go but now they have been constant. She has constant discomfort in her upper abdomen and constant nausea. Says that when she belches it smells like rotten eggs. She's had a couple episodes of vomiting. Symptoms are present all the time, does not matter whether she eats or not. Symptoms not necessarily worsened by eating. She tells me that she has not really been able to eat much at all; has been able to drink liquids, however.  She is diabetic, insulin-dependent, but tells me that her last hemoglobin A1c was around 7.  She had a CT scan of the abdomen and pelvis with contrast in mid December with no acute abnormalities.  This was done in the emergency department.  They told her that she may have ulcers and placed her on Carafate tablets 4 times a day, but no PPI. She denies any frequent heartburn/reflux/indigestion type symptoms. She denies NSAID use. CBC, CMP, lipase were relatively unremarkable on December 21.   Past Medical History:  Diagnosis Date  . Cushing's disease ([place])   . Diabetes ([place])   . Hyperlipidemia   . Hyperlipidemia   . Hypertension   . Morbid obesity ([place])   . Osteoporosis 07/19/2015  . 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 Uncontrolled diabetes mellitus with diabetic neuropathy, with long-term current use of insulin ([place]) 07/16/2015  . Vitamin D deficiency 07/19/2015   Past Surgical History:  Procedure Laterality Date  . ANTERIOR TALOFIBULAR LIGAMENT REPAIR Left 11/15/2014   Procedure: ANTERIOR TALOFIBULAR LIGAMENT REPAIR;  Surgeon: Jana Half, DPM;  Location: [facility];  Service: Podiatry;  Laterality: Left;  . CESAREAN SECTION  dec 1997/  06-03-2001/   01-01-2005   BILATERAL TUBAL LIGATION WITH LAST ONE  . DILATION AND CURETTAGE OF UTERUS  1995   WITH SUCTION  . LAPAROSCOPIC CHOLECYSTECTOMY  09-25-2005  . ORIF TIBIA PLATEAU Right 07/19/2015   Procedure: OPEN REDUCTION INTERNAL FIXATION (ORIF) RIGHT TIBIAL PLATEAU;  Surgeon: Altamese Carrboro, MD;  Location: [facility];  Service: Orthopedics;  Laterality: Right;  . PARTIAL KNEE ARTHROPLASTY Right 04/19/2014   Procedure: RIGHT UNI KNEE ARTHROPLASTY MEDIALLY ;  Surgeon: Mauri Pole, MD;  Location: [facility];  Service: Orthopedics;  Laterality: Right;    reports that she has been smoking Cigarettes.  She has smoked for the past 4.00 years. She has never used smokeless tobacco. She reports that she drinks alcohol. She reports that she does not use drugs. family history includes ADD / ADHD in her son; Apraxia in her son; Autism in her son. She was adopted. Allergies  Allergen Reactions  . Penicillins Hives    Has patient had a PCN reaction causing immediate rash, facial/tongue/throat swelling, SOB or lightheadedness with hypotension: Yes Has patient had a PCN reaction causing severe rash involving mucus membranes or skin necrosis: Yes Has patient had a PCN reaction that required hospitalization No Has patient had a PCN reaction occurring within the last 10 years: No If all of the above answers are "NO",  then may proceed with Cephalosporin use.       Outpatient Encounter Prescriptions as of 09/11/2016  Medication Sig  . acetaminophen (TYLENOL) 325 MG tablet Take 2 tablets (650 mg total) by mouth every 6 (six) hours as needed for mild pain (or Fever >/= 101).  Marland Kitchen buPROPion (WELLBUTRIN XL) 150 MG 24 hr tablet Take 150 mg by mouth daily.  . Cetirizine HCl (ZYRTEC ALLERGY) 10 MG CAPS Take 1 capsule (10 mg  total) by mouth daily. (Patient taking differently: Take 10 mg by mouth daily as needed (allergies). )  . cholecalciferol 1000 UNITS tablet Take 1 tablet (1,000 Units total) by mouth 2 (two) times daily. (Patient taking differently: Take 1,000-5,000 Units by mouth daily. Take 5000 units for the first week then 1000 units)  . cholestyramine (QUESTRAN) 4 g packet TAKE 1 PACKET (4 G TOTAL) BY MOUTH 2 (TWO) TIMES DAILY WITH A MEAL.  . cyclobenzaprine (FLEXERIL) 10 MG tablet Take 1 tablet (10 mg total) by mouth 2 (two) times daily as needed for muscle spasms.  . Exenatide ER (BYDUREON) 2 MG PEN Inject 2 mg into the skin once a week.  . fenofibrate (TRICOR) 48 MG tablet Take 48 mg by mouth daily.  . fluticasone (FLONASE) 50 MCG/ACT nasal spray 1 spray each nares bid (Patient taking differently: Place 1-2 sprays into both nostrils daily as needed for allergies. 1 spray each nares bid)  . gabapentin (NEURONTIN) 300 MG capsule Take 600 mg by mouth 2 (two) times daily.   Marland Kitchen gemfibrozil (LOPID) 600 MG tablet Take 600 mg by mouth 2 (two) times daily before a meal.  . HYDROcodone-acetaminophen (NORCO/VICODIN) 5-325 MG tablet Take 2 tablets by mouth every 4 (four) hours as needed.  . hydrOXYzine (VISTARIL) 50 MG capsule Take 100 mg by mouth at bedtime. sleep  . insulin detemir (LEVEMIR) 100 unit/ml SOLN Inject 50 Units into the skin every morning.  . insulin NPH-regular Human (NOVOLIN 70/30) (70-30) 100 UNIT/ML injection Inject 28 Units into the skin 2 (two) times daily with a meal. (Patient taking differently: Inject 25 Units into the skin at bedtime. )  . levothyroxine (SYNTHROID, LEVOTHROID) 75 MCG tablet Take 1 tablet (75 mcg total) by mouth every morning.  . meclizine (ANTIVERT) 25 MG tablet Take 25 mg by mouth at bedtime.  . ondansetron (ZOFRAN ODT) 4 MG disintegrating tablet Take 1 tablet (4 mg total) by mouth every 8 (eight) hours as needed for nausea or vomiting.  . pioglitazone (ACTOS) 45 MG tablet Take  45 mg by mouth daily.  . pravastatin (PRAVACHOL) 80 MG tablet Take 80 mg by mouth daily.  . quinapril (ACCUPRIL) 5 MG tablet Take 2.5 mg by mouth every morning.   . sucralfate (CARAFATE) 1 g tablet Take 1 tablet (1 g total) by mouth 4 (four) times daily -  with meals and at bedtime.  . traZODone (DESYREL) 50 MG tablet Take 50 mg by mouth at bedtime.  Marland Kitchen venlafaxine XR (EFFEXOR-XR) 37.5 MG 24 hr capsule Take 37.5-75 mg by mouth 2 (two) times daily. Take 2 capsules= 75mg  in the morning and take 1 capsule = 37.5mg  at bedtime   No facility-administered encounter medications on file as of 09/11/2016.      REVIEW OF SYSTEMS  : All other systems reviewed and negative except where noted in the History of Present Illness.   PHYSICAL EXAM: BP 110/70 (Cuff Size: Normal)   Pulse 84   Ht 5\' 2"  (1.575 m)   Wt (!) 321 lb (  145.6 kg)   LMP 08/22/2016   BMI 58.71 kg/m  General: Well developed white female in no acute distress Head: Normocephalic and atraumatic Eyes:  Sclerae anicteric, conjunctiva pink. Ears: Normal auditory acuity Lungs: Clear throughout to auscultation Heart: Regular rate and rhythm Abdomen: Soft, obese, non-distended.  Normal bowel sounds.  Epigastric and LUQ TTP. Musculoskeletal: Symmetrical with no gross deformities  Skin: No lesions on visible extremities Extremities: No edema  Neurological: Alert oriented x 4, grossly non-focal Psychological:  Alert and cooperative. Normal mood and affect  ASSESSMENT AND PLAN: -41 year old female with complaints of left upper quadrant and epigastric abdominal pain with associated constant nausea, couple episodes of vomiting.  CT scan ok.  ? Ulcer vs diabetic gastroparesis.  Will schedule for EGD with Dr. Ardis Hughs. This has to be done in [facility] due to high BMI.  The risks, benefits, and alternatives to EGD were discussed with the patient and she consents to proceed.  In the meantime I am going to place her on pantoprazole 40 mg twice  daily.  ? If she will need a GES pending on results of EGD.   CC:  Vladimir Creeks, MD

## 2016-09-12 NOTE — Progress Notes (Signed)
I agree with the above note, plan 

## 2016-09-13 ENCOUNTER — Inpatient Hospital Stay: Admit: 2016-09-13 | Attending: Rehabilitative and Restorative Service Providers"
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
## 2016-09-20 ENCOUNTER — Encounter: Attending: Rehabilitative and Restorative Service Providers"

## 2016-09-27 ENCOUNTER — Encounter: Attending: Rehabilitative and Restorative Service Providers"

## 2016-09-28 ENCOUNTER — Encounter: Attending: Rehabilitative and Restorative Service Providers"

## 2016-09-30 ENCOUNTER — Emergency Department (HOSPITAL_COMMUNITY)
Admission: EM | Admit: 2016-09-30 | Discharge: 2016-09-30 | Disposition: A | Discharge status: Home / Self Care | Payer: Medicaid Other | Attending: Physician Assistant | Admitting: Physician Assistant

## 2016-09-30 ENCOUNTER — Encounter (HOSPITAL_COMMUNITY): Payer: Self-pay | Admitting: Emergency Medicine

## 2016-09-30 DIAGNOSIS — Z794 Long term (current) use of insulin: Secondary | ICD-10-CM | POA: Insufficient documentation

## 2016-09-30 DIAGNOSIS — J0110 Acute frontal sinusitis, unspecified: Secondary | ICD-10-CM | POA: Diagnosis not present

## 2016-09-30 DIAGNOSIS — E1140 Type 2 diabetes mellitus with diabetic neuropathy, unspecified: Secondary | ICD-10-CM | POA: Diagnosis not present

## 2016-09-30 DIAGNOSIS — Z79899 Other long term (current) drug therapy: Secondary | ICD-10-CM | POA: Diagnosis not present

## 2016-09-30 DIAGNOSIS — I10 Essential (primary) hypertension: Secondary | ICD-10-CM | POA: Insufficient documentation

## 2016-09-30 DIAGNOSIS — J069 Acute upper respiratory infection, unspecified: Secondary | ICD-10-CM | POA: Diagnosis present

## 2016-09-30 DIAGNOSIS — F17210 Nicotine dependence, cigarettes, uncomplicated: Secondary | ICD-10-CM | POA: Diagnosis not present

## 2016-09-30 DIAGNOSIS — E039 Hypothyroidism, unspecified: Secondary | ICD-10-CM | POA: Insufficient documentation

## 2016-09-30 LAB — CBC
HCT: 43.1 % (ref 36.0–46.0)
HEMOGLOBIN: 14.5 g/dL (ref 12.0–15.0)
MCH: 28.9 pg (ref 26.0–34.0)
MCHC: 33.6 g/dL (ref 30.0–36.0)
MCV: 86 fL (ref 78.0–100.0)
Platelets: 418 10*3/uL — ABNORMAL HIGH (ref 150–400)
RBC: 5.01 MIL/uL (ref 3.87–5.11)
RDW: 14.1 % (ref 11.5–15.5)
WBC: 10.5 10*3/uL (ref 4.0–10.5)

## 2016-09-30 LAB — BASIC METABOLIC PANEL
Anion gap: 14 (ref 5–15)
BUN: 15 mg/dL (ref 6–20)
CHLORIDE: 98 mmol/L — AB (ref 101–111)
CO2: 22 mmol/L (ref 22–32)
CREATININE: 0.81 mg/dL (ref 0.44–1.00)
Calcium: 10.1 mg/dL (ref 8.9–10.3)
GFR calc non Af Amer: >60 mL/min (ref >60)
Glucose, Bld: 381 mg/dL — ABNORMAL HIGH (ref 65–99)
Potassium: 4.4 mmol/L (ref 3.5–5.1)
Sodium: 134 mmol/L — ABNORMAL LOW (ref 135–145)

## 2016-09-30 LAB — CBG MONITORING, ED: Glucose-Capillary: 368 mg/dL — ABNORMAL HIGH (ref 65–99)

## 2016-09-30 MED ORDER — AZITHROMYCIN 250 MG PO TABS
500.0000 mg | ORAL_TABLET | Freq: Once | ORAL | Status: AC
  Administered 2016-09-30: 500 mg via ORAL
  Filled 2016-09-30: qty 2

## 2016-09-30 MED ORDER — AZITHROMYCIN 250 MG PO TABS: ORAL_TABLET | ORAL | 0 refills | Status: DC

## 2016-09-30 NOTE — ED Triage Notes (Signed)
Pt was seen on 1/19 for URI. Pt was sent home with steroids. CBG has been running high since then and pt continues to have sinus pressure, congestion, dry cough. CBG running 300-400.

## 2016-09-30 NOTE — Discharge Instructions (Signed)
Please manage her sugars at home per instructions per your primary care physician. Take antibiotics for your sinus infection and follow-up as needed.

## 2016-09-30 NOTE — ED Provider Notes (Signed)
[facility] DEPT Provider Note   CSN: [id number] Arrival date & time: 09/30/16  1157  By signing my name below, I, Gwenlyn Fudge, attest that this documentation has been prepared under the direction and in the presence of Tomika Eckles Julio Alm, MD. Electronically Signed: Gwenlyn Fudge, ED Scribe. 09/30/16. 1:48 PM.   History   Chief Complaint Chief Complaint  Patient presents with  . URI  . Hyperglycemia   The history is provided by the patient. No language interpreter was used.   HPI Comments: [name] is a 41 y.o. female with PMHx of DM who presents to the Emergency Department complaining of gradual onset, constant, moderate sinus pressure and pain. Pt was seen at urgent care on 1/19 and diagnosed with an acute sinus infection. She was prescribed steroids, but has finished her course of steroids. She reports associated elevated blood sugar levels. Her CBG levels was unreadable at home, but CBG is now around 300-400. She denies fever, cough, shortness of breath. She has an EGD scheduled for 10/04/16.  Past Medical History:  Diagnosis Date  . Cushing's disease ([place])   . Diabetes ([place])   . Hyperlipidemia   . Hyperlipidemia   . Hypertension   . Morbid obesity ([place])   . Osteoporosis 07/19/2015  . 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 Uncontrolled diabetes mellitus with diabetic neuropathy, with long-term current use of insulin ([place]) 07/16/2015  . Vitamin D deficiency 07/19/2015    Patient Active Problem List   Diagnosis Date Noted  . Hypertriglyceridemia 07/22/2015  . Diabetes mellitus due to underlying condition, uncontrolled, with diabetic neuropathy, with long-term current use of insulin ([place])   . Vitamin D deficiency 07/19/2015  . 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 Osteoporosis 07/19/2015  . Periprosthetic fracture 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[place]), R tibial  plateau  07/18/2015  . Fracture, tibial plateau 07/16/2015  . Uncontrolled diabetes mellitus with diabetic neuropathy, with long-term current use of insulin ([place]) 07/16/2015  . Leukocytosis 07/16/2015  . Essential hypertension 07/16/2015  . Morbid obesity ([place]) 04/20/2014  . Hyperlipidemia 07/31/2012  . Cushing disease ([place]) 07/31/2012  . Hypothyroid 07/31/2012  . 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 ANTERIOR TALOFIBULAR LIGAMENT REPAIR Left 11/15/2014   Procedure: ANTERIOR TALOFIBULAR LIGAMENT REPAIR;  Surgeon: Jana Half, DPM;  Location: [facility];  Service: Podiatry;  Laterality: Left;  . CESAREAN SECTION  dec 1997/  06-03-2001/   01-01-2005   BILATERAL TUBAL LIGATION WITH LAST ONE  . DILATION AND CURETTAGE OF UTERUS  1995   WITH SUCTION  . LAPAROSCOPIC CHOLECYSTECTOMY  09-25-2005  . ORIF TIBIA PLATEAU Right 07/19/2015   Procedure: OPEN REDUCTION INTERNAL FIXATION (ORIF) RIGHT TIBIAL PLATEAU;  Surgeon: Altamese [place], MD;  Location: [facility];  Service: Orthopedics;  Laterality: Right;  . PARTIAL KNEE ARTHROPLASTY Right 04/19/2014   Procedure: RIGHT UNI KNEE ARTHROPLASTY MEDIALLY ;  Surgeon: Mauri Pole, MD;  Location: [facility];  Service: Orthopedics;  Laterality: Right;    OB History    No data available       Home Medications    Prior to Admission medications   Medication Sig Start Date End Date Taking? Authorizing Provider  acetaminophen (TYLENOL) 325 MG tablet Take 2 tablets (650 mg total) by mouth every 6 (six) hours as needed for mild pain (or Fever >/= 101). 07/21/15  Delfina Redwood, MD  buPROPion (WELLBUTRIN XL) 150 MG 24 hr tablet Take 150 mg by mouth daily.    Historical Provider, MD  Cetirizine HCl (ZYRTEC ALLERGY) 10 MG CAPS Take 1 capsule (10 mg total) by mouth daily. Patient taking differently: Take 10 mg by mouth daily as needed (allergies).  12/12/14    Tanna Furry, MD  cholecalciferol 1000 UNITS tablet Take 1 tablet (1,000 Units total) by mouth 2 (two) times daily. Patient taking differently: Take 1,000-5,000 Units by mouth daily. Take 5000 units for the first week then 1000 units 07/21/15   Delfina Redwood, MD  cholestyramine (QUESTRAN) 4 g packet TAKE 1 PACKET (4 G TOTAL) BY MOUTH 2 (TWO) TIMES DAILY WITH A MEAL. 04/09/16   [name], MD  cyclobenzaprine (FLEXERIL) 10 MG tablet Take 1 tablet (10 mg total) by mouth 2 (two) times daily as needed for muscle spasms. 01/15/16   Montine Circle, PA-C  Exenatide ER (BYDUREON) 2 MG PEN Inject 2 mg into the skin once a week.    Historical Provider, MD  fenofibrate (TRICOR) 48 MG tablet Take 48 mg by mouth daily.    Historical Provider, MD  fluticasone (FLONASE) 50 MCG/ACT nasal spray 1 spray each nares bid Patient taking differently: Place 1-2 sprays into both nostrils daily as needed for allergies. 1 spray each nares bid 12/12/14   Tanna Furry, MD  gabapentin (NEURONTIN) 300 MG capsule Take 600 mg by mouth 2 (two) times daily.     Historical Provider, MD  gemfibrozil (LOPID) 600 MG tablet Take 600 mg by mouth 2 (two) times daily before a meal.    Historical Provider, MD  HYDROcodone-acetaminophen (NORCO/VICODIN) 5-325 MG tablet Take 2 tablets by mouth every 4 (four) hours as needed. 08/31/16   Lorella Nimrod, MD  hydrOXYzine (VISTARIL) 50 MG capsule Take 100 mg by mouth at bedtime. sleep    Historical Provider, MD  insulin detemir (LEVEMIR) 100 unit/ml SOLN Inject 50 Units into the skin every morning.    Historical Provider, MD  insulin NPH-regular Human (NOVOLIN 70/30) (70-30) 100 UNIT/ML injection Inject 28 Units into the skin 2 (two) times daily with a meal. Patient taking differently: Inject 25 Units into the skin at bedtime.  07/21/15   Delfina Redwood, MD  levothyroxine (SYNTHROID, LEVOTHROID) 75 MCG tablet Take 1 tablet (75 mcg total) by mouth every morning. 07/21/15   Delfina Redwood, MD    meclizine (ANTIVERT) 25 MG tablet Take 25 mg by mouth at bedtime.    Historical Provider, MD  ondansetron (ZOFRAN ODT) 4 MG disintegrating tablet Take 1 tablet (4 mg total) by mouth every 8 (eight) hours as needed for nausea or vomiting. 08/25/16   Theodosia Quay, MD  pantoprazole (PROTONIX) 40 MG tablet Take 1 tablet (40 mg total) by mouth 2 (two) times daily before a meal. 09/11/16   Jessica D Zehr, PA-C  pioglitazone (ACTOS) 45 MG tablet Take 45 mg by mouth daily.    Historical Provider, MD  pravastatin (PRAVACHOL) 80 MG tablet Take 80 mg by mouth daily.    Historical Provider, MD  quinapril (ACCUPRIL) 5 MG tablet Take 2.5 mg by mouth every morning.     Historical Provider, MD  sucralfate (CARAFATE) 1 g tablet Take 1 tablet (1 g total) by mouth 4 (four) times daily -  with meals and at bedtime. 08/25/16   Theodosia Quay, MD  traZODone (DESYREL) 50 MG tablet Take 50 mg by mouth at bedtime.    Historical  Provider, MD  venlafaxine XR (EFFEXOR-XR) 37.5 MG 24 hr capsule Take 37.5-75 mg by mouth 2 (two) times daily. Take 2 capsules= 75mg  in the morning and take 1 capsule = 37.5mg  at bedtime    Historical Provider, MD    Family History Family History  Problem Relation Age of Onset  . Adopted: Yes  . Autism Son   . ADD / ADHD Son   . Apraxia Son     Social History Social History  Substance Use Topics  . Smoking status: Current Every Day Smoker    Years: 4.00    Types: Cigarettes  . Smokeless tobacco: Never Used  . Alcohol use Yes     Comment: RARE     Allergies   Penicillins   Review of Systems Review of Systems  Constitutional: Negative for fever.  HENT: Positive for sinus pain and sinus pressure.   Respiratory: Negative for cough and shortness of breath.   All other systems reviewed and are negative.  Physical Exam Updated Vital Signs BP 135/67 (BP Location: Left Arm)   Pulse 98   Temp 97.5 F (36.4 C) (Oral)   Resp 20   Ht 5\' 2"  (1.575 m)   Wt (!) 321 lb (145.6 kg)   LMP  08/27/2016   SpO2 96%   BMI 58.71 kg/m   Physical Exam  HENT:  Right Ear: Tympanic membrane is bulging.  Left Ear: Tympanic membrane is bulging.   ED Treatments / Results  DIAGNOSTIC STUDIES: Oxygen Saturation is 96% on RA, normal by my interpretation.    COORDINATION OF CARE: 1:29 PM Discussed treatment plan with pt at bedside which includes Zithromax and pt agreed to plan.   Labs (all labs ordered are listed, but only abnormal results are displayed) Labs Reviewed  BASIC METABOLIC PANEL - Abnormal; Notable for the following:       Result Value   Sodium 134 (*)    Chloride 98 (*)    Glucose, Bld 381 (*)    All other components within normal limits  CBC - Abnormal; Notable for the following:    Platelets 418 (*)    All other components within normal limits  CBG MONITORING, ED - Abnormal; Notable for the following:    Glucose-Capillary 368 (*)    All other components within normal limits  URINALYSIS, ROUTINE W REFLEX MICROSCOPIC    EKG  EKG Interpretation None       Radiology No results found.  Procedures Procedures (including critical care time)  Medications Ordered in ED Medications  azithromycin (ZITHROMAX) tablet 500 mg (not administered)     Initial Impression / Assessment and Plan / ED Course  I have reviewed the triage vital signs and the nursing notes.  Pertinent labs & imaging results that were available during my care of the patient were reviewed by me and considered in my medical decision making (see chart for details).    Patient is a 41 year old female with morbid obesity, diabetes, hyperlipidemia presenting today with sinus pain. Patient had sinus pain went to an outside facility and was given several symptomatic treatment as well as antibiotic that would not start until the 25th. She is here because she would like antibiotcs today. Patient reports that the prednisone gave her  high blood sugars. She reports was too high to read at home  initially and then she was able to get it to be able to read at home around 300. Today it is 300 which is around where it has  been the last several times that she's been here. We'll have her continue to manage her diabetes at home with instructions from her primary care physician. Given that her sinus pain has been lasting several days and the appearance of likely infection in her eardrums we will give her a prescription for antibiotics and have her follow-up with her primary care physician as needed.  I personally performed the services described in this documentation, which was scribed in my presence. The recorded information has been reviewed and is accurate.     Final Clinical Impressions(s) / ED Diagnoses   Final diagnoses:  None    New Prescriptions New Prescriptions   No medications on file     Clella Mckeel Julio Alm, MD 09/30/16 1353

## 2016-09-30 NOTE — ED Notes (Signed)
Patient brought back to room with family in tow

## 2016-10-03 ENCOUNTER — Telehealth: Payer: Self-pay | Admitting: Gastroenterology

## 2016-10-03 ENCOUNTER — Encounter (HOSPITAL_COMMUNITY): Payer: Self-pay

## 2016-10-03 ENCOUNTER — Encounter: Payer: BLUE CROSS/BLUE SHIELD | Attending: Orthopaedic Surgery

## 2016-10-03 NOTE — Anesthesia Preprocedure Evaluation (Addendum)
Anesthesia Evaluation  Patient identified by MRN, date of birth, ID band Patient awake    Reviewed: Allergy & Precautions, H&P , Patient's Chart, lab work & pertinent test results, reviewed documented beta blocker date and time   Airway Mallampati: IV  TM Distance: >3 FB Neck ROM: full    Dental no notable dental hx.    Pulmonary Current Smoker,    Pulmonary exam normal breath sounds clear to auscultation       Cardiovascular hypertension,  Rhythm:regular Rate:Normal     Neuro/Psych    GI/Hepatic   Endo/Other  diabetes  Renal/GU      Musculoskeletal   Abdominal   Peds  Hematology   Anesthesia Other Findings Hyperlipidemia    Cushing's disease ([place])   Morbid obesity ([place])    Diabetes ([place])      Hypertension   diabetes mellitus with diabetic neuropathy, with long-term current use of insulin Complication of anesthesia Pt. states takes long time to wake up from it.   Sleep apnea       Reproductive/Obstetrics                            Anesthesia Physical Anesthesia Plan  ASA: III  Anesthesia Plan: MAC   Post-op Pain Management:    Induction: Intravenous  Airway Management Planned: Mask and Natural Airway  Additional Equipment:   Intra-op Plan:   Post-operative Plan:   Informed Consent: I have reviewed the patients History and Physical, chart, labs and discussed the procedure including the risks, benefits and alternatives for the proposed anesthesia with the patient or authorized representative who has indicated his/her understanding and acceptance.   Dental Advisory Given  Plan Discussed with: CRNA and Surgeon  Anesthesia Plan Comments: (Discussed sedation and potential to need to place airway or ETT if warranted by clinical changes intra-operatively. We will start procedure as MAC.)        Anesthesia Quick Evaluation

## 2016-10-03 NOTE — Telephone Encounter (Signed)
Pt has been notified that she should arrive for procedure as scheduled and her blood sugar will be checked and if it is not at a safe range they will let her know.

## 2016-10-04 ENCOUNTER — Ambulatory Visit (HOSPITAL_COMMUNITY): Payer: Medicaid Other | Admitting: Anesthesiology

## 2016-10-04 ENCOUNTER — Ambulatory Visit (HOSPITAL_COMMUNITY)
Admission: RE | Admit: 2016-10-04 | Discharge: 2016-10-04 | Disposition: A | Discharge status: Home / Self Care | Payer: Medicaid Other | Source: Ambulatory Visit | Attending: Gastroenterology | Admitting: Gastroenterology

## 2016-10-04 ENCOUNTER — Encounter (HOSPITAL_COMMUNITY)
Admission: RE | Disposition: A | Discharge status: Home / Self Care | Payer: Self-pay | Source: Ambulatory Visit | Attending: Gastroenterology

## 2016-10-04 ENCOUNTER — Encounter (HOSPITAL_COMMUNITY): Payer: Self-pay

## 2016-10-04 DIAGNOSIS — K3184 Gastroparesis: Secondary | ICD-10-CM | POA: Insufficient documentation

## 2016-10-04 DIAGNOSIS — Z79899 Other long term (current) drug therapy: Secondary | ICD-10-CM | POA: Diagnosis not present

## 2016-10-04 DIAGNOSIS — R112 Nausea with vomiting, unspecified: Secondary | ICD-10-CM

## 2016-10-04 DIAGNOSIS — R1013 Epigastric pain: Secondary | ICD-10-CM

## 2016-10-04 DIAGNOSIS — I10 Essential (primary) hypertension: Secondary | ICD-10-CM | POA: Diagnosis not present

## 2016-10-04 DIAGNOSIS — Z794 Long term (current) use of insulin: Secondary | ICD-10-CM | POA: Insufficient documentation

## 2016-10-04 DIAGNOSIS — R1012 Left upper quadrant pain: Secondary | ICD-10-CM | POA: Insufficient documentation

## 2016-10-04 DIAGNOSIS — Z880 Allergy status to penicillin: Secondary | ICD-10-CM | POA: Diagnosis not present

## 2016-10-04 DIAGNOSIS — F17210 Nicotine dependence, cigarettes, uncomplicated: Secondary | ICD-10-CM | POA: Insufficient documentation

## 2016-10-04 DIAGNOSIS — Z6841 Body Mass Index (BMI) 40.0 and over, adult: Secondary | ICD-10-CM | POA: Diagnosis not present

## 2016-10-04 DIAGNOSIS — E1143 Type 2 diabetes mellitus with diabetic autonomic (poly)neuropathy: Secondary | ICD-10-CM | POA: Diagnosis not present

## 2016-10-04 DIAGNOSIS — E785 Hyperlipidemia, unspecified: Secondary | ICD-10-CM | POA: Insufficient documentation

## 2016-10-04 DIAGNOSIS — Z9049 Acquired absence of other specified parts of digestive tract: Secondary | ICD-10-CM | POA: Insufficient documentation

## 2016-10-04 DIAGNOSIS — M810 Age-related osteoporosis without current pathological fracture: Secondary | ICD-10-CM | POA: Insufficient documentation

## 2016-10-04 DIAGNOSIS — E249 Cushing's syndrome, unspecified: Secondary | ICD-10-CM | POA: Insufficient documentation

## 2016-10-04 DIAGNOSIS — E559 Vitamin D deficiency, unspecified: Secondary | ICD-10-CM | POA: Insufficient documentation

## 2016-10-04 HISTORY — PX: ESOPHAGOGASTRODUODENOSCOPY (EGD) WITH PROPOFOL: SHX5813

## 2016-10-04 HISTORY — DX: Other complications of anesthesia, initial encounter: T88.59XA

## 2016-10-04 HISTORY — DX: Adverse effect of unspecified anesthetic, initial encounter: T41.45XA

## 2016-10-04 LAB — GLUCOSE, CAPILLARY: Glucose-Capillary: 89 mg/dL (ref 65–99)

## 2016-10-04 SURGERY — ESOPHAGOGASTRODUODENOSCOPY (EGD) WITH PROPOFOL
Anesthesia: Monitor Anesthesia Care

## 2016-10-04 MED ORDER — LIDOCAINE 2% (20 MG/ML) 5 ML SYRINGE
INTRAMUSCULAR | Status: AC
  Filled 2016-10-04: qty 5

## 2016-10-04 MED ORDER — PROPOFOL 500 MG/50ML IV EMUL
INTRAVENOUS | Status: DC | PRN
  Administered 2016-10-04: 150 ug/kg/min via INTRAVENOUS

## 2016-10-04 MED ORDER — METOCLOPRAMIDE HCL 10 MG PO TABS: 10.0000 mg | ORAL_TABLET | Freq: Every day | ORAL | 5 refills | Status: DC

## 2016-10-04 MED ORDER — GLYCOPYRROLATE 0.2 MG/ML IJ SOLN
INTRAMUSCULAR | Status: DC | PRN
  Administered 2016-10-04: 0.2 mg via INTRAVENOUS

## 2016-10-04 MED ORDER — LACTATED RINGERS IV SOLN
INTRAVENOUS | Status: DC
  Administered 2016-10-04: 07:00:00 via INTRAVENOUS

## 2016-10-04 MED ORDER — LIDOCAINE HCL (CARDIAC) 20 MG/ML IV SOLN
INTRAVENOUS | Status: DC | PRN
  Administered 2016-10-04: 75 mg via INTRAVENOUS

## 2016-10-04 MED ORDER — SODIUM CHLORIDE 0.9 % IV SOLN: INTRAVENOUS | Status: DC

## 2016-10-04 MED ORDER — PROPOFOL 10 MG/ML IV BOLUS
INTRAVENOUS | Status: AC
  Filled 2016-10-04: qty 40

## 2016-10-04 SURGICAL SUPPLY — 15 items

## 2016-10-04 NOTE — Discharge Instructions (Signed)
YOU HAD AN ENDOSCOPIC PROCEDURE TODAY: Refer to the procedure report and other information in the discharge instructions given to you for any specific questions about what was found during the examination. If this information does not answer your questions, please call [facility] office at [phone] to clarify.   YOU SHOULD EXPECT: Some feelings of bloating in the abdomen. Passage of more gas than usual. Walking can help get rid of the air that was put into your GI tract during the procedure and reduce the bloating. If you had a lower endoscopy (such as a colonoscopy or flexible sigmoidoscopy) you may notice spotting of blood in your stool or on the toilet paper. Some abdominal soreness may be present for a day or two, also.  DIET: Your first meal following the procedure should be a light meal and then it is ok to progress to your normal diet. A half-sandwich or bowl of soup is an example of a good first meal. Heavy or fried foods are harder to digest and may make you feel nauseous or bloated. Drink plenty of fluids but you should avoid alcoholic beverages for 24 hours. If you had a esophageal dilation, please see attached instructions for diet.    ACTIVITY: Your care partner should take you home directly after the procedure. You should plan to take it easy, moving slowly for the rest of the day. You can resume normal activity the day after the procedure however YOU SHOULD NOT DRIVE, use power tools, machinery or perform tasks that involve climbing or major physical exertion for 24 hours (because of the sedation medicines used during the test).   SYMPTOMS TO REPORT IMMEDIATELY: A gastroenterologist can be reached at any hour. Please call [phone]  for any of the following symptoms:  Following lower endoscopy (colonoscopy, flexible sigmoidoscopy) Excessive amounts of blood in the stool  Significant tenderness, worsening of abdominal pains  Swelling of the abdomen that is new, acute  Fever of 100 or  higher  Following upper endoscopy (EGD, EUS, ERCP, esophageal dilation) Vomiting of blood or coffee ground material  New, significant abdominal pain  New, significant chest pain or pain under the shoulder blades  Painful or persistently difficult swallowing  New shortness of breath  Black, tarry-looking or red, bloody stools  FOLLOW UP:  If any biopsies were taken you will be contacted by phone or by letter within the next 1-3 weeks. Call [phone]  if you have not heard about the biopsies in 3 weeks.  Please also call with any specific questions about appointments or follow up tests.YOU HAD AN ENDOSCOPIC PROCEDURE TODAY: Refer to the procedure report and other information in the discharge instructions given to you for any specific questions about what was found during the examination. If this information does not answer your questions, please call [place] office at [phone] to clarify.   YOU SHOULD EXPECT: Some feelings of bloating in the abdomen. Passage of more gas than usual. Walking can help get rid of the air that was put into your GI tract during the procedure and reduce the bloating. If you had a lower endoscopy (such as a colonoscopy or flexible sigmoidoscopy) you may notice spotting of blood in your stool or on the toilet paper. Some abdominal soreness may be present for a day or two, also.  DIET: Your first meal following the procedure should be a light meal and then it is ok to progress to your normal diet. A half-sandwich or bowl of soup is an example of a   good first meal. Heavy or fried foods are harder to digest and may make you feel nauseous or bloated. Drink plenty of fluids but you should avoid alcoholic beverages for 24 hours. If you had a esophageal dilation, please see attached instructions for diet.    ACTIVITY: Your care partner should take you home directly after the procedure. You should plan to take it easy, moving slowly for the rest of the day. You can resume  normal activity the day after the procedure however YOU SHOULD NOT DRIVE, use power tools, machinery or perform tasks that involve climbing or major physical exertion for 24 hours (because of the sedation medicines used during the test).   SYMPTOMS TO REPORT IMMEDIATELY: A gastroenterologist can be reached at any hour. Please call [phone]  for any of the following symptoms:  Following lower endoscopy (colonoscopy, flexible sigmoidoscopy) Excessive amounts of blood in the stool  Significant tenderness, worsening of abdominal pains  Swelling of the abdomen that is new, acute  Fever of 100 or higher  Following upper endoscopy (EGD, EUS, ERCP, esophageal dilation) Vomiting of blood or coffee ground material  New, significant abdominal pain  New, significant chest pain or pain under the shoulder blades  Painful or persistently difficult swallowing  New shortness of breath  Black, tarry-looking or red, bloody stools  FOLLOW UP:  If any biopsies were taken you will be contacted by phone or by letter within the next 1-3 weeks. Call [phone]  if you have not heard about the biopsies in 3 weeks.  Please also call with any specific questions about appointments or follow up tests. 

## 2016-10-04 NOTE — Interval H&P Note (Signed)
History and Physical Interval Note:  10/04/2016 7:15 AM  [name]  has presented today for surgery, with the diagnosis of nausea, vomiting, epigastric pain, LUQ abd pain/db  The various methods of treatment have been discussed with the patient and family. After consideration of risks, benefits and other options for treatment, the patient has consented to  Procedure(s): ESOPHAGOGASTRODUODENOSCOPY (EGD) WITH PROPOFOL (N/A) as a surgical intervention .  The patient's history has been reviewed, patient examined, no change in status, stable for surgery.  I have reviewed the patient's chart and labs.  Questions were answered to the patient's satisfaction.     [name]

## 2016-10-04 NOTE — H&P (View-Only) (Signed)
09/11/2016 [name] [id number] November 03, 1975   HISTORY OF PRESENT ILLNESS:  This is a 41 year old female who is known to Dr. Ardis Hughs in 2016 for complaints of diarrhea. She uses [facility] for this issue. She presents to our office today with complaints of epigastric and left upper quadrant abdominal pain with associated nausea and a couple episodes of vomiting. She says that these issues began a couple of months ago and initially would come and go but now they have been constant. She has constant discomfort in her upper abdomen and constant nausea. Says that when she belches it smells like rotten eggs. She's had a couple episodes of vomiting. Symptoms are present all the time, does not matter whether she eats or not. Symptoms not necessarily worsened by eating. She tells me that she has not really been able to eat much at all; has been able to drink liquids, however.  She is diabetic, insulin-dependent, but tells me that her last hemoglobin A1c was around 7.  She had a CT scan of the abdomen and pelvis with contrast in mid December with no acute abnormalities.  This was done in the emergency department.  They told her that she may have ulcers and placed her on Carafate tablets 4 times a day, but no PPI. She denies any frequent heartburn/reflux/indigestion type symptoms. She denies NSAID use. CBC, CMP, lipase were relatively unremarkable on December 21.   Past Medical History:  Diagnosis Date  . Cushing's disease ([place])   . Diabetes ([place])   . Hyperlipidemia   . Hyperlipidemia   . Hypertension   . Morbid obesity ([place])   . Osteoporosis 07/19/2015  . 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 Uncontrolled diabetes mellitus with diabetic neuropathy, with long-term current use of insulin ([place]) 07/16/2015  . Vitamin D deficiency 07/19/2015   Past Surgical History:  Procedure Laterality Date  . ANTERIOR TALOFIBULAR LIGAMENT REPAIR Left 11/15/2014   Procedure: ANTERIOR TALOFIBULAR LIGAMENT REPAIR;  Surgeon: Jana Half, DPM;  Location: [facility];  Service: Podiatry;  Laterality: Left;  . CESAREAN SECTION  dec 1997/  06-03-2001/   01-01-2005   BILATERAL TUBAL LIGATION WITH LAST ONE  . DILATION AND CURETTAGE OF UTERUS  1995   WITH SUCTION  . LAPAROSCOPIC CHOLECYSTECTOMY  09-25-2005  . ORIF TIBIA PLATEAU Right 07/19/2015   Procedure: OPEN REDUCTION INTERNAL FIXATION (ORIF) RIGHT TIBIAL PLATEAU;  Surgeon: Altamese Sandia Park, MD;  Location: [facility];  Service: Orthopedics;  Laterality: Right;  . PARTIAL KNEE ARTHROPLASTY Right 04/19/2014   Procedure: RIGHT UNI KNEE ARTHROPLASTY MEDIALLY ;  Surgeon: Mauri Pole, MD;  Location: [facility];  Service: Orthopedics;  Laterality: Right;    reports that she has been smoking Cigarettes.  She has smoked for the past 4.00 years. She has never used smokeless tobacco. She reports that she drinks alcohol. She reports that she does not use drugs. family history includes ADD / ADHD in her son; Apraxia in her son; Autism in her son. She was adopted. Allergies  Allergen Reactions  . Penicillins Hives    Has patient had a PCN reaction causing immediate rash, facial/tongue/throat swelling, SOB or lightheadedness with hypotension: Yes Has patient had a PCN reaction causing severe rash involving mucus membranes or skin necrosis: Yes Has patient had a PCN reaction that required hospitalization No Has patient had a PCN reaction occurring within the last 10 years: No If all of the above answers are "NO",  then may proceed with Cephalosporin use.       Outpatient Encounter Prescriptions as of 09/11/2016  Medication Sig  . acetaminophen (TYLENOL) 325 MG tablet Take 2 tablets (650 mg total) by mouth every 6 (six) hours as needed for mild pain (or Fever >/= 101).  Marland Kitchen buPROPion (WELLBUTRIN XL) 150 MG 24 hr tablet Take 150 mg by mouth daily.  . Cetirizine HCl (ZYRTEC ALLERGY) 10 MG CAPS Take 1 capsule (10 mg  total) by mouth daily. (Patient taking differently: Take 10 mg by mouth daily as needed (allergies). )  . cholecalciferol 1000 UNITS tablet Take 1 tablet (1,000 Units total) by mouth 2 (two) times daily. (Patient taking differently: Take 1,000-5,000 Units by mouth daily. Take 5000 units for the first week then 1000 units)  . cholestyramine (QUESTRAN) 4 g packet TAKE 1 PACKET (4 G TOTAL) BY MOUTH 2 (TWO) TIMES DAILY WITH A MEAL.  . cyclobenzaprine (FLEXERIL) 10 MG tablet Take 1 tablet (10 mg total) by mouth 2 (two) times daily as needed for muscle spasms.  . Exenatide ER (BYDUREON) 2 MG PEN Inject 2 mg into the skin once a week.  . fenofibrate (TRICOR) 48 MG tablet Take 48 mg by mouth daily.  . fluticasone (FLONASE) 50 MCG/ACT nasal spray 1 spray each nares bid (Patient taking differently: Place 1-2 sprays into both nostrils daily as needed for allergies. 1 spray each nares bid)  . gabapentin (NEURONTIN) 300 MG capsule Take 600 mg by mouth 2 (two) times daily.   Marland Kitchen gemfibrozil (LOPID) 600 MG tablet Take 600 mg by mouth 2 (two) times daily before a meal.  . HYDROcodone-acetaminophen (NORCO/VICODIN) 5-325 MG tablet Take 2 tablets by mouth every 4 (four) hours as needed.  . hydrOXYzine (VISTARIL) 50 MG capsule Take 100 mg by mouth at bedtime. sleep  . insulin detemir (LEVEMIR) 100 unit/ml SOLN Inject 50 Units into the skin every morning.  . insulin NPH-regular Human (NOVOLIN 70/30) (70-30) 100 UNIT/ML injection Inject 28 Units into the skin 2 (two) times daily with a meal. (Patient taking differently: Inject 25 Units into the skin at bedtime. )  . levothyroxine (SYNTHROID, LEVOTHROID) 75 MCG tablet Take 1 tablet (75 mcg total) by mouth every morning.  . meclizine (ANTIVERT) 25 MG tablet Take 25 mg by mouth at bedtime.  . ondansetron (ZOFRAN ODT) 4 MG disintegrating tablet Take 1 tablet (4 mg total) by mouth every 8 (eight) hours as needed for nausea or vomiting.  . pioglitazone (ACTOS) 45 MG tablet Take  45 mg by mouth daily.  . pravastatin (PRAVACHOL) 80 MG tablet Take 80 mg by mouth daily.  . quinapril (ACCUPRIL) 5 MG tablet Take 2.5 mg by mouth every morning.   . sucralfate (CARAFATE) 1 g tablet Take 1 tablet (1 g total) by mouth 4 (four) times daily -  with meals and at bedtime.  . traZODone (DESYREL) 50 MG tablet Take 50 mg by mouth at bedtime.  Marland Kitchen venlafaxine XR (EFFEXOR-XR) 37.5 MG 24 hr capsule Take 37.5-75 mg by mouth 2 (two) times daily. Take 2 capsules= 75mg  in the morning and take 1 capsule = 37.5mg  at bedtime   No facility-administered encounter medications on file as of 09/11/2016.      REVIEW OF SYSTEMS  : All other systems reviewed and negative except where noted in the History of Present Illness.   PHYSICAL EXAM: BP 110/70 (Cuff Size: Normal)   Pulse 84   Ht 5\' 2"  (1.575 m)   Wt (!) 321 lb (  145.6 kg)   LMP 08/22/2016   BMI 58.71 kg/m  General: Well developed white female in no acute distress Head: Normocephalic and atraumatic Eyes:  Sclerae anicteric, conjunctiva pink. Ears: Normal auditory acuity Lungs: Clear throughout to auscultation Heart: Regular rate and rhythm Abdomen: Soft, obese, non-distended.  Normal bowel sounds.  Epigastric and LUQ TTP. Musculoskeletal: Symmetrical with no gross deformities  Skin: No lesions on visible extremities Extremities: No edema  Neurological: Alert oriented x 4, grossly non-focal Psychological:  Alert and cooperative. Normal mood and affect  ASSESSMENT AND PLAN: -41 year old female with complaints of left upper quadrant and epigastric abdominal pain with associated constant nausea, couple episodes of vomiting.  CT scan ok.  ? Ulcer vs diabetic gastroparesis.  Will schedule for EGD with Dr. Ardis Hughs. This has to be done in [facility] due to high BMI.  The risks, benefits, and alternatives to EGD were discussed with the patient and she consents to proceed.  In the meantime I am going to place her on pantoprazole 40 mg twice  daily.  ? If she will need a GES pending on results of EGD.   CC:  Vladimir Creeks, MD

## 2016-10-04 NOTE — Transfer of Care (Signed)
Immediate Anesthesia Transfer of Care Note  Patient: [name]  Procedure(s) Performed: Procedure(s): ESOPHAGOGASTRODUODENOSCOPY (EGD) WITH PROPOFOL (N/A)  Patient Location: PACU  Anesthesia Type:MAC  Level of Consciousness: awake, alert , oriented and patient cooperative  Airway & Oxygen Therapy: Patient Spontanous Breathing and Patient connected to nasal cannula oxygen  Post-op Assessment: Report given to RN, Post -op Vital signs reviewed and stable and Patient moving all extremities X 4  Post vital signs: stable  Last Vitals:  Vitals:   10/04/16 0629  BP: 134/61  Pulse: 87  Resp: (!) 24  Temp: 36.4 C    Last Pain:  Vitals:   10/04/16 0629  TempSrc: Oral         Complications: No apparent anesthesia complications

## 2016-10-04 NOTE — Anesthesia Postprocedure Evaluation (Signed)
Anesthesia Post Note  Patient: [name]  Procedure(s) Performed: Procedure(s) (LRB): ESOPHAGOGASTRODUODENOSCOPY (EGD) WITH PROPOFOL (N/A)  Patient location during evaluation: PACU Anesthesia Type: MAC Level of consciousness: awake and alert Pain management: satisfactory to patient Vital Signs Assessment: post-procedure vital signs reviewed and stable Respiratory status: spontaneous breathing, nonlabored ventilation, respiratory function stable and patient connected to nasal cannula oxygen Cardiovascular status: stable and blood pressure returned to baseline Anesthetic complications: no       Last Vitals:  Vitals:   10/04/16 0810 10/04/16 0820  BP: (!) 116/54 119/61  Pulse: 85 88  Resp: (!) 21 20  Temp:      Last Pain:  Vitals:   10/04/16 0747  TempSrc: Oral                 Antwion Carpenter EDWARD

## 2016-10-04 NOTE — Op Note (Signed)
[facility] Patient Name: [name] Procedure Date: 10/04/2016 MRN: [id number] Attending MD: [name] , MD Date of Birth: 1976/06/06 CSN: [id number] Age: 41 Admit Type: Inpatient Procedure:                Upper GI endoscopy Indications:              Epigastric abdominal pain, Abdominal pain in the                            left upper quadrant Providers:                [name], MD, Vista Lawman, RN, William Dalton, Technician Referring MD:              Medicines:                Monitored Anesthesia Care Complications:            No immediate complications. Estimated blood loss:                            None. Estimated Blood Loss:     Estimated blood loss: none. Procedure:                Pre-Anesthesia Assessment:                           - Prior to the procedure, a History and Physical                            was performed, and patient medications and                            allergies were reviewed. The patient's tolerance of                            previous anesthesia was also reviewed. The risks                            and benefits of the procedure and the sedation                            options and risks were discussed with the patient.                            All questions were answered, and informed consent                            was obtained. Prior Anticoagulants: The patient has                            taken no previous anticoagulant or antiplatelet                            agents. ASA Grade  Assessment: IV - A patient with                            severe systemic disease that is a constant threat                            to life. After reviewing the risks and benefits,                            the patient was deemed in satisfactory condition to                            undergo the procedure.                           After obtaining informed consent, the endoscope was                      passed under direct vision. Throughout the                            procedure, the patient's blood pressure, pulse, and                            oxygen saturations were monitored continuously. The                            [id number]) scope was introduced through the                            mouth, and advanced to the second part of duodenum.                            The upper GI endoscopy was accomplished without                            difficulty. The patient tolerated the procedure                            well. Scope In: Scope Out: Findings:      The esophagus was normal.      A large amount of food (residue) was found in the gastric body.      The examined duodenum was normal. Impression:               - Normal esophagus.                           - A large amount of food (residue) in the stomach                            without evidence of gastric outlet obstruction.                            This is most likely due to gastroparesis, perhaps  from long standing DM.                           - Normal examined duodenum.                           - No specimens collected. Moderate Sedation:      N/A- Per Anesthesia Care Recommendation:           - Patient has a contact number available for                            emergencies. The signs and symptoms of potential                            delayed complications were discussed with the                            patient. Return to normal activities tomorrow.                            Written discharge instructions were provided to the                            patient.                           - You need to eat multiple small meals per day (4-5                            small meals rather than 1-2 larger meals daily)                           - Continue present medications. Also new                            prescription written for reglan 10mg  pill, take one                             pill at bedtime nightly.                           - No repeat upper endoscopy.                           - Will order Hb A1c level today.                           - Return to see Dr. Ardis Hughs in 6-8 weeks. Procedure Code(s):        --- Professional ---                           [phone], Esophagogastroduodenoscopy, flexible,                            transoral; diagnostic, including collection of  specimen(s) by brushing or washing, when performed                            (separate procedure) Diagnosis Code(s):        --- Professional ---                           R10.13, Epigastric pain                           R10.12, Left upper quadrant pain CPT copyright 2016 [employer]. All rights reserved. The codes documented in this report are preliminary and upon coder review may  be revised to meet current compliance requirements. [name], MD 10/04/2016 7:51:19 AM This report has been signed electronically. Number of Addenda: 0

## 2016-10-05 ENCOUNTER — Encounter (HOSPITAL_COMMUNITY): Payer: Self-pay | Admitting: Gastroenterology

## 2016-10-05 LAB — HEMOGLOBIN A1C
HEMOGLOBIN A1C: 10.5 % — AB (ref 4.8–5.6)
MEAN PLASMA GLUCOSE: 255 mg/dL

## 2016-10-08 ENCOUNTER — Encounter: Payer: BLUE CROSS/BLUE SHIELD | Attending: Orthopaedic Surgery

## 2016-10-31 ENCOUNTER — Encounter (HOSPITAL_COMMUNITY): Payer: Self-pay | Admitting: Emergency Medicine

## 2016-10-31 ENCOUNTER — Emergency Department (HOSPITAL_COMMUNITY)
Admission: EM | Admit: 2016-10-31 | Discharge: 2016-10-31 | Disposition: A | Discharge status: Home / Self Care | Payer: Medicaid Other | Attending: Emergency Medicine | Admitting: Emergency Medicine

## 2016-10-31 ENCOUNTER — Emergency Department (HOSPITAL_COMMUNITY): Payer: Medicaid Other

## 2016-10-31 DIAGNOSIS — R1030 Lower abdominal pain, unspecified: Secondary | ICD-10-CM | POA: Diagnosis present

## 2016-10-31 DIAGNOSIS — R319 Hematuria, unspecified: Secondary | ICD-10-CM

## 2016-10-31 DIAGNOSIS — N939 Abnormal uterine and vaginal bleeding, unspecified: Secondary | ICD-10-CM | POA: Diagnosis not present

## 2016-10-31 LAB — CBC
HCT: 39.6 % (ref 36.0–46.0)
Hemoglobin: 13.5 g/dL (ref 12.0–15.0)
MCH: 28.6 pg (ref 26.0–34.0)
MCHC: 34.1 g/dL (ref 30.0–36.0)
MCV: 83.9 fL (ref 78.0–100.0)
PLATELETS: 434 10*3/uL — AB (ref 150–400)
RBC: 4.72 MIL/uL (ref 3.87–5.11)
RDW: 13.6 % (ref 11.5–15.5)
WBC: 10 10*3/uL (ref 4.0–10.5)

## 2016-10-31 LAB — URINALYSIS, ROUTINE W REFLEX MICROSCOPIC
Bacteria, UA: NONE SEEN
Bilirubin Urine: NEGATIVE
Ketones, ur: NEGATIVE mg/dL
Leukocytes, UA: NEGATIVE
Nitrite: NEGATIVE
PH: 6 (ref 5.0–8.0)
Protein, ur: 100 mg/dL — AB
SQUAMOUS EPITHELIAL / LPF: NONE SEEN
Specific Gravity, Urine: 1.027 (ref 1.005–1.030)
WBC, UA: NONE SEEN WBC/hpf (ref 0–5)

## 2016-10-31 LAB — COMPREHENSIVE METABOLIC PANEL
ALK PHOS: 78 U/L (ref 38–126)
ALT: 19 U/L (ref 14–54)
AST: 16 U/L (ref 15–41)
Albumin: 3.7 g/dL (ref 3.5–5.0)
Anion gap: 9 (ref 5–15)
BUN: 20 mg/dL (ref 6–20)
CALCIUM: 9.9 mg/dL (ref 8.9–10.3)
CHLORIDE: 102 mmol/L (ref 101–111)
CO2: 25 mmol/L (ref 22–32)
CREATININE: 0.75 mg/dL (ref 0.44–1.00)
Glucose, Bld: 346 mg/dL — ABNORMAL HIGH (ref 65–99)
Potassium: 4 mmol/L (ref 3.5–5.1)
Sodium: 136 mmol/L (ref 135–145)
Total Bilirubin: 0.4 mg/dL (ref 0.3–1.2)
Total Protein: 7.6 g/dL (ref 6.5–8.1)

## 2016-10-31 LAB — LIPASE, BLOOD: Lipase: 34 U/L (ref 11–51)

## 2016-10-31 LAB — I-STAT BETA HCG BLOOD, ED (MC, WL, AP ONLY): I-stat hCG, quantitative: <5 m[IU]/mL (ref <5)

## 2016-10-31 MED ORDER — ONDANSETRON HCL 4 MG/2ML IJ SOLN
4.0000 mg | Freq: Once | INTRAMUSCULAR | Status: AC
  Administered 2016-10-31: 4 mg via INTRAVENOUS
  Filled 2016-10-31: qty 2

## 2016-10-31 MED ORDER — TRAMADOL HCL 50 MG PO TABS: 50.0000 mg | ORAL_TABLET | Freq: Four times a day (QID) | ORAL | 0 refills | Status: DC | PRN

## 2016-10-31 MED ORDER — SODIUM CHLORIDE 0.9 % IV BOLUS (SEPSIS)
1000.0000 mL | Freq: Once | INTRAVENOUS | Status: AC
  Administered 2016-10-31: 1000 mL via INTRAVENOUS

## 2016-10-31 MED ORDER — MORPHINE SULFATE (PF) 4 MG/ML IV SOLN
4.0000 mg | INTRAVENOUS | Status: DC | PRN
  Administered 2016-10-31: 4 mg via INTRAVENOUS
  Filled 2016-10-31: qty 1

## 2016-10-31 MED ORDER — ONDANSETRON 4 MG PO TBDP
4.0000 mg | ORAL_TABLET | Freq: Once | ORAL | Status: AC | PRN
  Administered 2016-10-31: 4 mg via ORAL
  Filled 2016-10-31: qty 1

## 2016-10-31 MED ORDER — NAPROXEN 500 MG PO TABS: 500.0000 mg | ORAL_TABLET | Freq: Two times a day (BID) | ORAL | 0 refills | Status: DC

## 2016-10-31 NOTE — Discharge Instructions (Signed)
Follow-up with GYN Re: Vaginal bleeding  Follow-up with urology if the blood in your urine persists beyond your.  Naproxen for mild pain associated with bleeding. Ultram for worsening pain.Marland Kitchen

## 2016-10-31 NOTE — ED Triage Notes (Signed)
Pt comes from [facility] with complaints of abdominal pain and heavy bleeding.  Is unsure wether it is vaginal or uretheral bleeding or both.  Pt does not normally have much of a period and it is normally light and only lasts 24 hours.  She has been having heavy bleeding since Sunday.  Endorses cramping RUQ pain radiating to her back. Nausea but denies vomiting.

## 2016-10-31 NOTE — ED Provider Notes (Signed)
[facility] DEPT Provider Note   CSN: [id number] Arrival date & time: 10/31/16  1651     History   Chief Complaint Chief Complaint  Patient presents with  . Abdominal Pain  . Bleeding from uknown source    HPI [name] is a 41 y.o. female.Chief complaint is abdominal pain and vaginal bleeding.  HPI:  Patient describes abdominal cramping. Is somewhat suprapubic. Occasional" under both ribs". States it become worse over the last few days. States she typically has one to 2 days of bleeding per cycle. However now she's been having bleeding for the last week. Worsening pain, presents here. Blood in urine at times.   Past Medical History:  Diagnosis Date  . Anxiety   . Complication of anesthesia    Pt. states takes long time to wake up from it.   . Cushing's disease ([place])   . Depression   . Diabetes ([place])   . Hyperlipidemia   . Hyperlipidemia   . Hypertension   . Morbid obesity ([place])   . Osteoporosis 07/19/2015  . 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 Sleep apnea   . Uncontrolled diabetes mellitus with diabetic neuropathy, with long-term current use of insulin ([place]) 07/16/2015  . Vitamin D deficiency 07/19/2015    Patient Active Problem List   Diagnosis Date Noted  . Abdominal pain, epigastric   . LUQ abdominal pain   . Nausea and vomiting   . Gastroparesis   . Hypertriglyceridemia 07/22/2015  . Diabetes mellitus due to underlying condition, uncontrolled, with diabetic neuropathy, with long-term current use of insulin ([place])   . Vitamin D deficiency 07/19/2015  . 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 Osteoporosis 07/19/2015  . 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 Fracture, tibial plateau 07/16/2015  . Uncontrolled diabetes mellitus with diabetic neuropathy, with long-term current use of insulin ([place])  07/16/2015  . Leukocytosis 07/16/2015  . Essential hypertension 07/16/2015  . Morbid obesity ([place]) 04/20/2014  . Hyperlipidemia 07/31/2012  . Cushing disease ([place]) 07/31/2012  . Hypothyroid 07/31/2012  . Major depressive disorder, recurrent episode, mild ([place]) 04/08/2012    Past Surgical History:  Procedure Laterality Date  . ANTERIOR TALOFIBULAR LIGAMENT REPAIR Left 11/15/2014   Procedure: ANTERIOR TALOFIBULAR LIGAMENT REPAIR;  Surgeon: Jana Half, DPM;  Location: [facility];  Service: Podiatry;  Laterality: Left;  . CESAREAN SECTION  dec 1997/  06-03-2001/   01-01-2005   BILATERAL TUBAL LIGATION WITH LAST ONE  . DILATION AND CURETTAGE OF UTERUS  1995   WITH SUCTION  . ESOPHAGOGASTRODUODENOSCOPY (EGD) WITH PROPOFOL N/A 10/04/2016   Procedure: ESOPHAGOGASTRODUODENOSCOPY (EGD) WITH PROPOFOL;  Surgeon: [name], MD;  [facility];  Service: Endoscopy;  Laterality: N/A;  . LAPAROSCOPIC CHOLECYSTECTOMY  09-25-2005  . ORIF TIBIA PLATEAU Right 07/19/2015   Procedure: OPEN REDUCTION INTERNAL FIXATION (ORIF) RIGHT TIBIAL PLATEAU;  Surgeon: Altamese [place], MD;  Location: [facility];  Service: Orthopedics;  Laterality: Right;  . PARTIAL KNEE ARTHROPLASTY Right 04/19/2014   Procedure: RIGHT UNI KNEE ARTHROPLASTY MEDIALLY ;  Surgeon: Mauri Pole, MD;  [facility];  Service: Orthopedics;  Laterality: Right;  . TUBAL LIGATION      OB History    No data available       Home Medications    Prior to Admission medications   Medication Sig Start Date End Date Taking? Authorizing Provider  buPROPion (WELLBUTRIN XL) 150 MG 24 hr tablet Take 150 mg by mouth daily.   Yes Historical Provider, MD  fenofibrate (TRICOR) 48 MG tablet Take 48 mg by mouth daily.   Yes Historical Provider, MD  gabapentin (NEURONTIN) 300 MG capsule Take 600 mg by mouth 2 (two) times daily.    Yes Historical Provider, MD  gemfibrozil (LOPID) 600 MG tablet Take 600 mg by mouth 2 (two) times  daily before a meal.   Yes Historical Provider, MD  insulin detemir (LEVEMIR) 100 unit/ml SOLN Inject 60 Units into the skin every morning.    Yes Historical Provider, MD  insulin NPH-regular Human (NOVOLIN 70/30) (70-30) 100 UNIT/ML injection Inject 28 Units into the skin 2 (two) times daily with a meal. Patient taking differently: Inject 50 Units into the skin at bedtime.  07/21/15  Yes Delfina Redwood, MD  levothyroxine (SYNTHROID, LEVOTHROID) 75 MCG tablet Take 1 tablet (75 mcg total) by mouth every morning. 07/21/15  Yes Delfina Redwood, MD  meclizine (ANTIVERT) 25 MG tablet Take 25 mg by mouth at bedtime.   Yes Historical Provider, MD  metoCLOPramide (REGLAN) 10 MG tablet Take 1 tablet (10 mg total) by mouth at bedtime. 10/04/16  Yes [name], MD  pantoprazole (PROTONIX) 40 MG tablet Take 1 tablet (40 mg total) by mouth 2 (two) times daily before a meal. 09/11/16  Yes Jessica D Zehr, PA-C  pravastatin (PRAVACHOL) 80 MG tablet Take 80 mg by mouth daily.   Yes Historical Provider, MD  quinapril (ACCUPRIL) 5 MG tablet Take 2.5 mg by mouth every morning.    Yes Historical Provider, MD  sucralfate (CARAFATE) 1 g tablet Take 1 tablet (1 g total) by mouth 4 (four) times daily -  with meals and at bedtime. 08/25/16  Yes Theodosia Quay, MD  traZODone (DESYREL) 50 MG tablet Take 50 mg by mouth at bedtime.   Yes Historical Provider, MD  venlafaxine XR (EFFEXOR-XR) 37.5 MG 24 hr capsule Take 37.5-75 mg by mouth 2 (two) times daily. Take 2 capsules= 75mg  in the morning and take 1 capsule = 37.5mg  at bedtime   Yes Historical Provider, MD  acetaminophen (TYLENOL) 325 MG tablet Take 2 tablets (650 mg total) by mouth every 6 (six) hours as needed for mild pain (or Fever >/= 101). Patient not taking: Reported on 10/31/2016 07/21/15   Delfina Redwood, MD  Cetirizine HCl (ZYRTEC ALLERGY) 10 MG CAPS Take 1 capsule (10 mg total) by mouth daily. Patient not taking: Reported on 10/31/2016 12/12/14   Tanna Furry,  MD  Exenatide ER (BYDUREON) 2 MG PEN Inject 2 mg into the skin every Sunday.     Historical Provider, MD  fluticasone Asencion Islam) 50 MCG/ACT nasal spray 1 spray each nares bid Patient not taking: Reported on 10/31/2016 12/12/14   Tanna Furry, MD  naproxen (NAPROSYN) 500 MG tablet Take 1 tablet (500 mg total) by mouth 2 (two) times daily. 10/31/16   Tanna Furry, MD  ondansetron (ZOFRAN ODT) 4 MG disintegrating tablet Take 1 tablet (4 mg total) by mouth every 8 (eight) hours as needed for nausea or vomiting. 08/25/16   Theodosia Quay, MD  traMADol (ULTRAM) 50 MG tablet Take 1 tablet (50 mg total) by mouth every 6 (six) hours as needed. 10/31/16   Tanna Furry, MD    Family History Family History  Problem Relation Age of Onset  . Adopted: Yes  . Autism Son   . ADD / ADHD Son   . Apraxia  Son     Social History Social History  Substance Use Topics  . Smoking status: Current Every Day Smoker    Years: 4.00    Types: Cigarettes  . Smokeless tobacco: Never Used  . Alcohol use Yes     Comment: RARE     Allergies   Penicillins   Review of Systems Review of Systems  Constitutional: Negative for appetite change, chills, diaphoresis, fatigue and fever.  HENT: Negative for mouth sores, sore throat and trouble swallowing.   Eyes: Negative for visual disturbance.  Respiratory: Negative for cough, chest tightness, shortness of breath and wheezing.   Cardiovascular: Negative for chest pain.  Gastrointestinal: Positive for abdominal pain. Negative for abdominal distention, diarrhea, nausea and vomiting.  Endocrine: Negative for polydipsia, polyphagia and polyuria.  Genitourinary: Positive for hematuria, pelvic pain and vaginal bleeding. Negative for dysuria and frequency.  Musculoskeletal: Negative for gait problem.  Skin: Negative for color change, pallor and rash.  Neurological: Negative for dizziness, syncope, light-headedness and headaches.  Hematological: Does not bruise/bleed easily.    Psychiatric/Behavioral: Negative for behavioral problems and confusion.     Physical Exam Updated Vital Signs BP 120/70 (BP Location: Left Arm)   Pulse 98   Temp 98.6 F (37 C) (Oral)   Resp 16   LMP 10/28/2016   SpO2 98%   Physical Exam  Constitutional: She is oriented to person, place, and time. She appears well-developed and well-nourished. No distress.  HENT:  Head: Normocephalic.  Eyes: Conjunctivae are normal. Pupils are equal, round, and reactive to light. No scleral icterus.  Neck: Normal range of motion. Neck supple. No thyromegaly present.  Cardiovascular: Normal rate and regular rhythm.  Exam reveals no gallop and no friction rub.   No murmur heard. Pulmonary/Chest: Effort normal and breath sounds normal. No respiratory distress. She has no wheezes. She has no rales.  Abdominal: Soft. Bowel sounds are normal. She exhibits no distension. There is tenderness. There is no rebound.  Musculoskeletal: Normal range of motion.  Neurological: She is alert and oriented to person, place, and time.  Skin: Skin is warm and dry. No rash noted.  Psychiatric: She has a normal mood and affect. Her behavior is normal.     ED Treatments / Results  Labs (all labs ordered are listed, but only abnormal results are displayed) Labs Reviewed  COMPREHENSIVE METABOLIC PANEL - Abnormal; Notable for the following:       Result Value   Glucose, Bld 346 (*)    All other components within normal limits  CBC - Abnormal; Notable for the following:    Platelets 434 (*)    All other components within normal limits  URINALYSIS, ROUTINE W REFLEX MICROSCOPIC - Abnormal; Notable for the following:    Color, Urine RED (*)    APPearance CLOUDY (*)    Glucose, UA >=500 (*)    Hgb urine dipstick LARGE (*)    Protein, ur 100 (*)    All other components within normal limits  LIPASE, BLOOD  I-STAT BETA HCG BLOOD, ED ([facility], [facility], [facility] ONLY)    EKG  EKG Interpretation None       Radiology Ct Renal  Stone Study  Result Date: 10/31/2016 CLINICAL DATA:  Abdominal pain with bleeding EXAM: CT ABDOMEN AND PELVIS WITHOUT CONTRAST TECHNIQUE: Multidetector CT imaging of the abdomen and pelvis was performed following the standard protocol without IV contrast. COMPARISON:  08/25/2016 FINDINGS: Lower chest: Lung bases demonstrate no acute infiltrate or effusion. Linear atelectasis in  the left lower lobe. Normal heart size. Hepatobiliary: A focal hypodensity seen in the caudate lobe, measuring 2.8 cm with fat density units, suggesting prominent focal infiltration, this finding is more circumscribed compared with the prior study. Status post cholecystectomy. No biliary dilatation. Pancreas: Unremarkable. No pancreatic ductal dilatation or surrounding inflammatory changes. Spleen: Normal in size without focal abnormality. Adrenals/Urinary Tract: Adrenal glands are unremarkable. Kidneys are normal, without renal calculi, focal lesion, or hydronephrosis. Bladder is unremarkable. Stomach/Bowel: Stomach is within normal limits. Appendix appears normal. No evidence of bowel wall thickening, distention, or inflammatory changes. Vascular/Lymphatic: Aortic atherosclerosis. No enlarged abdominal or pelvic lymph nodes. Reproductive: Uterus and bilateral adnexa are unremarkable. Other: Small fat containing ventral hernias. No free air or free fluid. Musculoskeletal: No acute or significant osseous findings. Mild degenerative changes IMPRESSION: 1. Negative for nephrolithiasis, hydronephrosis or ureteral stone. 2. Uterus and adnexal structures are grossly unremarkable 3. Focal low-attenuation lesion in the caudate lobe suggesting focal fat infiltration. Electronically Signed   By: Donavan Foil M.D.   On: 10/31/2016 21:29    Procedures Procedures (including critical care time)  Medications Ordered in ED Medications  morphine 4 MG/ML injection 4 mg (4 mg Intravenous Given 10/31/16 2126)  ondansetron (ZOFRAN-ODT) disintegrating  tablet 4 mg (4 mg Oral Given 10/31/16 1714)  ondansetron (ZOFRAN) injection 4 mg (4 mg Intravenous Given 10/31/16 2126)  sodium chloride 0.9 % bolus 1,000 mL (0 mLs Intravenous Stopped 10/31/16 2301)     Initial Impression / Assessment and Plan / ED Course  I have reviewed the triage vital signs and the nursing notes.  Pertinent labs & imaging results that were available during my care of the patient were reviewed by me and considered in my medical decision making (see chart for details).    Not pregnant. Negative CT. Bloody urine, but pt declines cath urine. Will Dc c Naproxen, and f/u with Urology and gyn.   Final Clinical Impressions(s) / ED Diagnoses   Final diagnoses:  Hematuria, unspecified type  Vaginal bleeding    New Prescriptions Discharge Medication List as of 10/31/2016 10:56 PM    START taking these medications   Details  naproxen (NAPROSYN) 500 MG tablet Take 1 tablet (500 mg total) by mouth 2 (two) times daily., Starting Wed 10/31/2016, Print    traMADol (ULTRAM) 50 MG tablet Take 1 tablet (50 mg total) by mouth every 6 (six) hours as needed., Starting Wed 10/31/2016, Print         Tanna Furry, MD 10/31/16 [phone]

## 2016-11-23 NOTE — Telephone Encounter (Signed)
WAITING ON BILLING STATEMENTS FROM [facility] 06/24/16 TO PRESENT FOR [facility]

## 2016-11-28 NOTE — Telephone Encounter (Signed)
GAVE [facility] MEDICAL RECORDS & ITEMIZED BILLING  06/24/2016 TO PRESENT TO JENNY BRAUN TO SCAN IN MRO TO [facility]

## 2016-11-28 NOTE — Telephone Encounter (Signed)
Scanned [facility] medical records / billing records for 06/24/16 to present into MRO for [facility].

## 2016-11-30 ENCOUNTER — Ambulatory Visit (INDEPENDENT_AMBULATORY_CARE_PROVIDER_SITE_OTHER): Payer: Medicaid Other | Admitting: Gastroenterology

## 2016-11-30 ENCOUNTER — Encounter: Payer: Self-pay | Admitting: Gastroenterology

## 2016-11-30 VITALS — BP 98/58 | HR 82 | Resp 16 | Ht 62.0 in | Wt 320.0 lb

## 2016-11-30 DIAGNOSIS — R112 Nausea with vomiting, unspecified: Secondary | ICD-10-CM

## 2016-11-30 DIAGNOSIS — K3184 Gastroparesis: Secondary | ICD-10-CM

## 2016-11-30 DIAGNOSIS — E1143 Type 2 diabetes mellitus with diabetic autonomic (poly)neuropathy: Secondary | ICD-10-CM | POA: Diagnosis not present

## 2016-11-30 NOTE — Patient Instructions (Signed)
Continue reglan 10mg  pill at bedtime every night. Tight blood sugar control is the best way to help your stomach function long term.

## 2016-11-30 NOTE — Progress Notes (Signed)
Review of pertinent gastrointestinal problems: 1. Gastroparesis (clinically).  Intermittent nausea, vomiting. EGD 09/2016 Dr. Ardis Hughs found a large amount of food in the stomach without any anatomic etiology.  Long standing DM.  Labs 09/2016 Hb A1C 10.5.  Trial of reglan 10mg  nightly helped and she is going to be seeing a DM specialist.   HPI: This is a  very pleasant 41 year old woman whom I last saw the time of an EGD about 2 months ago.  Chief complaint is gastroparesis  Since her EGD  She's been taking the reglan 10mg  pills.  Nasuea and vomiting are much better.  Bedtime only.  Pains in stomach completely  went away.  She sees PCP for her DM. And she is being referred to [facility] diabetic specialist for further help    ROS: complete GI ROS as described in HPI.  Constitutional:  No unintentional weight loss   Past Medical History:  Diagnosis Date  . Anxiety   . Complication of anesthesia    Pt. states takes long time to wake up from it.   . Cushing's disease ([place])   . Depression   . Diabetes ([place])   . Hyperlipidemia   . Hyperlipidemia   . Hypertension   . Morbid obesity ([place])   . Osteoporosis 07/19/2015  . 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 Sleep apnea   . Uncontrolled diabetes mellitus with diabetic neuropathy, with long-term current use of insulin ([place]) 07/16/2015  . Vitamin D deficiency 07/19/2015    Past Surgical History:  Procedure Laterality Date  . ANTERIOR TALOFIBULAR LIGAMENT REPAIR Left 11/15/2014   Procedure: ANTERIOR TALOFIBULAR LIGAMENT REPAIR;  Surgeon: Jana Half, DPM;  Location: [facility];  Service: Podiatry;  Laterality: Left;  . CESAREAN SECTION  dec 1997/  06-03-2001/   01-01-2005   BILATERAL TUBAL LIGATION WITH LAST ONE  . DILATION AND CURETTAGE OF UTERUS  1995   WITH SUCTION  . ESOPHAGOGASTRODUODENOSCOPY (EGD) WITH PROPOFOL N/A 10/04/2016   Procedure:  ESOPHAGOGASTRODUODENOSCOPY (EGD) WITH PROPOFOL;  Surgeon: [name], MD;  [facility];  Service: Endoscopy;  Laterality: N/A;  . LAPAROSCOPIC CHOLECYSTECTOMY  09-25-2005  . ORIF TIBIA PLATEAU Right 07/19/2015   Procedure: OPEN REDUCTION INTERNAL FIXATION (ORIF) RIGHT TIBIAL PLATEAU;  Surgeon: Altamese Troy, MD;  Location: [facility];  Service: Orthopedics;  Laterality: Right;  . PARTIAL KNEE ARTHROPLASTY Right 04/19/2014   Procedure: RIGHT UNI KNEE ARTHROPLASTY MEDIALLY ;  Surgeon: Mauri Pole, MD;  [facility];  Service: Orthopedics;  Laterality: Right;  . TUBAL LIGATION      Current Outpatient Prescriptions  Medication Sig Dispense Refill  . buPROPion (WELLBUTRIN XL) 150 MG 24 hr tablet Take 150 mg by mouth daily.    . Exenatide ER (BYDUREON) 2 MG PEN Inject 2 mg into the skin every Sunday.     . fenofibrate (TRICOR) 48 MG tablet Take 48 mg by mouth daily.    Marland Kitchen gabapentin (NEURONTIN) 300 MG capsule Take 600 mg by mouth 2 (two) times daily.     Marland Kitchen gemfibrozil (LOPID) 600 MG tablet Take 600 mg by mouth 2 (two) times daily before a meal.    . insulin detemir (LEVEMIR) 100 unit/ml SOLN Inject 60 Units into the skin every morning.     . insulin NPH-regular Human (NOVOLIN 70/30) (70-30) 100 UNIT/ML injection Inject 28 Units into the skin 2 (two) times daily with a meal. (Patient taking differently: Inject 50 Units into  the skin at bedtime. ) 10 mL 11  . levothyroxine (SYNTHROID, LEVOTHROID) 75 MCG tablet Take 1 tablet (75 mcg total) by mouth every morning.    . meclizine (ANTIVERT) 25 MG tablet Take 25 mg by mouth at bedtime.    . metoCLOPramide (REGLAN) 10 MG tablet Take 1 tablet (10 mg total) by mouth at bedtime. 30 tablet 5  . naproxen (NAPROSYN) 500 MG tablet Take 1 tablet (500 mg total) by mouth 2 (two) times daily. 30 tablet 0  . ondansetron (ZOFRAN ODT) 4 MG disintegrating tablet Take 1 tablet (4 mg total) by mouth every 8 (eight) hours as needed for nausea or vomiting. 20  tablet 0  . pantoprazole (PROTONIX) 40 MG tablet Take 1 tablet (40 mg total) by mouth 2 (two) times daily before a meal. 60 tablet 3  . pravastatin (PRAVACHOL) 80 MG tablet Take 80 mg by mouth daily.    . quinapril (ACCUPRIL) 5 MG tablet Take 2.5 mg by mouth every morning.     . sucralfate (CARAFATE) 1 g tablet Take 1 tablet (1 g total) by mouth 4 (four) times daily -  with meals and at bedtime. 30 tablet 0  . traMADol (ULTRAM) 50 MG tablet Take 1 tablet (50 mg total) by mouth every 6 (six) hours as needed. 15 tablet 0  . traZODone (DESYREL) 50 MG tablet Take 50 mg by mouth at bedtime.    Marland Kitchen venlafaxine XR (EFFEXOR-XR) 37.5 MG 24 hr capsule Take 37.5-75 mg by mouth 2 (two) times daily. Take 2 capsules= 75mg  in the morning and take 1 capsule = 37.5mg  at bedtime     No current facility-administered medications for this visit.     Allergies as of 11/30/2016 - Review Complete 11/30/2016  Allergen Reaction Noted  . Penicillins Hives 01/11/2012    Family History  Problem Relation Age of Onset  . Adopted: Yes  . Autism Son   . ADD / ADHD Son   . Apraxia Son     Social History   Social History  . Marital status: Married    Spouse name: [name]  . Number of children: 2  . Years of education: N/A   Occupational History  . Not on file.   Social History Main Topics  . Smoking status: Current Every Day Smoker    Years: 4.00    Types: Cigarettes  . Smokeless tobacco: Never Used  . Alcohol use Yes     Comment: RARE  . Drug use: No  . Sexual activity: Yes   Other Topics Concern  . Not on file   Social History Narrative  . No narrative on file     Physical Exam: BP (!) 98/58   Pulse 82   Resp 16   Ht 5\' 2"  (1.575 m)   Wt (!) 320 lb (145.2 kg)   BMI 58.53 kg/m  Constitutional morbidly obese ic: alert and oriented x3 Abdomen: soft, nontender, nondistended, no obvious ascites, no peritoneal signs, normal bowel sounds No peripheral edema noted in lower  extremities  Assessment and plan: 41 y.o. female with  gastroparesis related to poorly controlled diabetes  she seems to understand that her best treatment long-term for her gastroparesis is tighter diabetic control and weight loss. She has already been referred to a diabetic specialist at [facility] by her primary care physician. She will continue taking Reglan 10 mg at bedtime nightly since this seems to be helping her intermittent nausea vomiting and abdominal pains. I see no reason  for any further blood tests or imaging studies. She will follow-up as needed.   Please see the "Patient Instructions" section for addition details about the plan.  Owens Loffler, MD [facility] 11/30/2016, 3:35 PM

## 2017-01-05 ENCOUNTER — Other Ambulatory Visit: Payer: Self-pay | Admitting: Gastroenterology

## 2017-01-09 ENCOUNTER — Other Ambulatory Visit (HOSPITAL_COMMUNITY): Payer: Self-pay | Admitting: Internal Medicine

## 2017-01-09 DIAGNOSIS — R1901 Right upper quadrant abdominal swelling, mass and lump: Secondary | ICD-10-CM

## 2017-01-16 ENCOUNTER — Encounter (HOSPITAL_COMMUNITY): Payer: Self-pay

## 2017-01-16 ENCOUNTER — Ambulatory Visit (HOSPITAL_COMMUNITY)
Admission: RE | Admit: 2017-01-16 | Discharge: 2017-01-16 | Disposition: A | Discharge status: Home / Self Care | Payer: Medicaid Other | Source: Ambulatory Visit | Attending: Internal Medicine | Admitting: Internal Medicine

## 2017-01-16 DIAGNOSIS — N200 Calculus of kidney: Secondary | ICD-10-CM | POA: Insufficient documentation

## 2017-01-16 DIAGNOSIS — R1901 Right upper quadrant abdominal swelling, mass and lump: Secondary | ICD-10-CM | POA: Diagnosis present

## 2017-01-16 DIAGNOSIS — K429 Umbilical hernia without obstruction or gangrene: Secondary | ICD-10-CM | POA: Insufficient documentation

## 2017-01-16 LAB — POCT I-STAT CREATININE: CREATININE: 0.7 mg/dL (ref 0.44–1.00)

## 2017-01-16 MED ORDER — IOPAMIDOL (ISOVUE-300) INJECTION 61%
100.0000 mL | Freq: Once | INTRAVENOUS | Status: AC | PRN
  Administered 2017-01-16: 100 mL via INTRAVENOUS

## 2017-01-16 MED ORDER — IOPAMIDOL (ISOVUE-300) INJECTION 61%
INTRAVENOUS | Status: AC
  Filled 2017-01-16: qty 100

## 2017-02-03 ENCOUNTER — Other Ambulatory Visit: Payer: Self-pay | Admitting: Gastroenterology

## 2017-03-04 ENCOUNTER — Other Ambulatory Visit: Payer: Self-pay | Admitting: Gastroenterology

## 2017-03-30 ENCOUNTER — Other Ambulatory Visit: Payer: Self-pay | Admitting: Gastroenterology

## 2017-08-25 ENCOUNTER — Encounter (HOSPITAL_COMMUNITY): Payer: Self-pay | Admitting: Emergency Medicine

## 2017-08-25 ENCOUNTER — Other Ambulatory Visit: Payer: Self-pay

## 2017-08-25 ENCOUNTER — Emergency Department (HOSPITAL_COMMUNITY)
Admission: EM | Admit: 2017-08-25 | Discharge: 2017-08-26 | Disposition: A | Discharge status: Home / Self Care | Payer: Medicaid Other | Attending: Emergency Medicine | Admitting: Emergency Medicine

## 2017-08-25 DIAGNOSIS — M542 Cervicalgia: Secondary | ICD-10-CM | POA: Diagnosis not present

## 2017-08-25 DIAGNOSIS — F17210 Nicotine dependence, cigarettes, uncomplicated: Secondary | ICD-10-CM | POA: Insufficient documentation

## 2017-08-25 DIAGNOSIS — Z79899 Other long term (current) drug therapy: Secondary | ICD-10-CM | POA: Insufficient documentation

## 2017-08-25 DIAGNOSIS — M79601 Pain in right arm: Secondary | ICD-10-CM | POA: Diagnosis not present

## 2017-08-25 DIAGNOSIS — E1140 Type 2 diabetes mellitus with diabetic neuropathy, unspecified: Secondary | ICD-10-CM | POA: Diagnosis not present

## 2017-08-25 DIAGNOSIS — R1110 Vomiting, unspecified: Secondary | ICD-10-CM | POA: Diagnosis not present

## 2017-08-25 DIAGNOSIS — M79621 Pain in right upper arm: Secondary | ICD-10-CM | POA: Diagnosis present

## 2017-08-25 DIAGNOSIS — Z794 Long term (current) use of insulin: Secondary | ICD-10-CM | POA: Diagnosis not present

## 2017-08-25 DIAGNOSIS — I10 Essential (primary) hypertension: Secondary | ICD-10-CM | POA: Diagnosis not present

## 2017-08-25 DIAGNOSIS — E039 Hypothyroidism, unspecified: Secondary | ICD-10-CM | POA: Insufficient documentation

## 2017-08-25 NOTE — ED Triage Notes (Signed)
Pt reports right arm pain that started before Thanksgiving that has gotten progressively worse.  She is now unable to sleep due to the pain.  She was seen at urgent care Thursday and given medications.  Since starting the medications she has not been able to keep any food down.  Multiple complaints

## 2017-08-26 ENCOUNTER — Emergency Department (HOSPITAL_COMMUNITY): Payer: Medicaid Other

## 2017-08-26 MED ORDER — ONDANSETRON 4 MG PO TBDP
8.0000 mg | ORAL_TABLET | Freq: Once | ORAL | Status: AC
  Administered 2017-08-26: 8 mg via ORAL
  Filled 2017-08-26: qty 2

## 2017-08-26 MED ORDER — KETOROLAC TROMETHAMINE 60 MG/2ML IM SOLN
60.0000 mg | Freq: Once | INTRAMUSCULAR | Status: AC
  Administered 2017-08-26: 60 mg via INTRAMUSCULAR
  Filled 2017-08-26: qty 2

## 2017-08-26 NOTE — ED Provider Notes (Signed)
[facility] EMERGENCY DEPARTMENT Provider Note   CSN: [id number] Arrival date & time: 08/25/17  1954     History   Chief Complaint Chief Complaint  Patient presents with  . Arm Pain  . Emesis    HPI [name] is a 41 y.o. female.  HPI Patient is a 41 year old female who presents with ongoing neck right shoulder and right humerus pain since a fall just after Thanksgiving.  She reports being seen 3 days ago by her primary care provider and prescribed muscle relaxant and hydrocodone without improvement in her symptoms.  She has been given a referral to orthopedics but was concerned about how long it will be until she can see the orthopedic team.  She is concerned about the possibility of a herniated disc.  She reports some tingling down her right upper extremity.  She denies weakness of her right upper extremity.  She reports pain with range of motion of her right shoulder and palpation of her proximal humerus.   Past Medical History:  Diagnosis Date  . Anxiety   . Complication of anesthesia    Pt. states takes long time to wake up from it.   . Cushing's disease ([place])   . Depression   . Diabetes ([place])   . Hyperlipidemia   . Hyperlipidemia   . Hypertension   . Morbid obesity ([place])   . Osteoporosis 07/19/2015  . 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 Sleep apnea   . Uncontrolled diabetes mellitus with diabetic neuropathy, with long-term current use of insulin ([place]) 07/16/2015  . Vitamin D deficiency 07/19/2015    Patient Active Problem List   Diagnosis Date Noted  . Abdominal pain, epigastric   . LUQ abdominal pain   . Nausea and vomiting   . Gastroparesis   . Hypertriglyceridemia 07/22/2015  . Diabetes mellitus due to underlying condition, uncontrolled, with diabetic neuropathy, with long-term current use of insulin ([place])   . Vitamin D deficiency 07/19/2015  . Pathological fracture due to  secondary osteoporosis, R tibial plateau  07/19/2015  . Osteoporosis 07/19/2015  . 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 Fracture, tibial plateau 07/16/2015  . Uncontrolled diabetes mellitus with diabetic neuropathy, with long-term current use of insulin ([place]) 07/16/2015  . Leukocytosis 07/16/2015  . Essential hypertension 07/16/2015  . Morbid obesity ([place]) 04/20/2014  . Hyperlipidemia 07/31/2012  . Cushing disease ([place]) 07/31/2012  . Hypothyroid 07/31/2012  . Major depressive disorder, recurrent episode, mild ([place]) 04/08/2012    Past Surgical History:  Procedure Laterality Date  . ANTERIOR TALOFIBULAR LIGAMENT REPAIR Left 11/15/2014   Procedure: ANTERIOR TALOFIBULAR LIGAMENT REPAIR;  Surgeon: Jana Half, DPM;  Location: [facility];  Service: Podiatry;  Laterality: Left;  . CESAREAN SECTION  dec 1997/  06-03-2001/   01-01-2005   BILATERAL TUBAL LIGATION WITH LAST ONE  . DILATION AND CURETTAGE OF UTERUS  1995   WITH SUCTION  . ESOPHAGOGASTRODUODENOSCOPY (EGD) WITH PROPOFOL N/A 10/04/2016   Procedure: ESOPHAGOGASTRODUODENOSCOPY (EGD) WITH PROPOFOL;  Surgeon: [name], MD;  [facility];  Service: Endoscopy;  Laterality: N/A;  . LAPAROSCOPIC CHOLECYSTECTOMY  09-25-2005  . ORIF TIBIA PLATEAU Right 07/19/2015   Procedure: OPEN REDUCTION INTERNAL FIXATION (ORIF) RIGHT TIBIAL PLATEAU;  Surgeon: Altamese Teresita, MD;  Location: [facility];  Service: Orthopedics;  Laterality: Right;  . PARTIAL KNEE ARTHROPLASTY Right 04/19/2014   Procedure: RIGHT UNI KNEE ARTHROPLASTY  MEDIALLY ;  Surgeon: Mauri Pole, MD;  [facility];  Service: Orthopedics;  Laterality: Right;  . TUBAL LIGATION      OB History    No data available       Home Medications    Prior to Admission medications   Medication Sig Start Date End Date Taking? Authorizing Provider  buPROPion (WELLBUTRIN XL) 150 MG 24 hr tablet Take 150 mg  by mouth daily.    [provider]  Exenatide ER (BYDUREON) 2 MG PEN Inject 2 mg into the skin every Sunday.     [provider]  fenofibrate (TRICOR) 48 MG tablet Take 48 mg by mouth daily.    [provider]  gabapentin (NEURONTIN) 300 MG capsule Take 600 mg by mouth 2 (two) times daily.     [provider]  gemfibrozil (LOPID) 600 MG tablet Take 600 mg by mouth 2 (two) times daily before a meal.    [provider]  insulin detemir (LEVEMIR) 100 unit/ml SOLN Inject 60 Units into the skin every morning.     [provider]  insulin NPH-regular Human (NOVOLIN 70/30) (70-30) 100 UNIT/ML injection Inject 28 Units into the skin 2 (two) times daily with a meal. Patient taking differently: Inject 50 Units into the skin at bedtime.  07/21/15   Delfina Redwood, MD  levothyroxine (SYNTHROID, LEVOTHROID) 75 MCG tablet Take 1 tablet (75 mcg total) by mouth every morning. 07/21/15   Delfina Redwood, MD  meclizine (ANTIVERT) 25 MG tablet Take 25 mg by mouth at bedtime.    [provider]  metoCLOPramide (REGLAN) 10 MG tablet TAKE ONE TABLET BY MOUTH AT BEDTIME 04/01/17   [name], MD  naproxen (NAPROSYN) 500 MG tablet Take 1 tablet (500 mg total) by mouth 2 (two) times daily. 10/31/16   Tanna Furry, MD  ondansetron (ZOFRAN ODT) 4 MG disintegrating tablet Take 1 tablet (4 mg total) by mouth every 8 (eight) hours as needed for nausea or vomiting. 08/25/16   Theodosia Quay, MD  pantoprazole (PROTONIX) 40 MG tablet TAKE ONE TABLET BY MOUTH TWICE A DAY BEFORE MEALS 04/01/17   [name], MD  pravastatin (PRAVACHOL) 80 MG tablet Take 80 mg by mouth daily.    [provider]  quinapril (ACCUPRIL) 5 MG tablet Take 2.5 mg by mouth every morning.     [provider]  sucralfate (CARAFATE) 1 g tablet Take 1 tablet (1 g total) by mouth 4 (four) times daily -  with meals and at bedtime. 08/25/16   Theodosia Quay, MD  traMADol  (ULTRAM) 50 MG tablet Take 1 tablet (50 mg total) by mouth every 6 (six) hours as needed. 10/31/16   Tanna Furry, MD  traZODone (DESYREL) 50 MG tablet Take 50 mg by mouth at bedtime.    [provider]  venlafaxine XR (EFFEXOR-XR) 37.5 MG 24 hr capsule Take 37.5-75 mg by mouth 2 (two) times daily. Take 2 capsules= 75mg  in the morning and take 1 capsule = 37.5mg  at bedtime    [provider]    Family History Family History  Adopted: Yes  Problem Relation Age of Onset  . Autism Son   . ADD / ADHD Son   . Apraxia Son     Social History Social History   Tobacco Use  . Smoking status: Current Every Day Smoker    Years: 4.00    Types: Cigarettes  . Smokeless tobacco: Never Used  Substance Use  Topics  . Alcohol use: Yes    Comment: RARE  . Drug use: No     Allergies   Penicillins   Review of Systems Review of Systems  All other systems reviewed and are negative.    Physical Exam Updated Vital Signs BP (!) 156/77 (BP Location: Right Arm)   Pulse (!) 110   Temp 98.6 F (37 C) (Oral)   Resp 18   Ht 5\' 2"  (1.575 m)   Wt (!) 146.5 kg (323 lb)   SpO2 97%   BMI 59.08 kg/m   Physical Exam  Constitutional: She is oriented to person, place, and time. She appears well-developed and well-nourished. No distress.  HENT:  Head: Normocephalic and atraumatic.  Eyes: EOM are normal.  Neck: Normal range of motion.  Cardiovascular: Normal rate and regular rhythm.  Pulmonary/Chest: Effort normal and breath sounds normal.  Abdominal: Soft. She exhibits no distension. There is no tenderness.  Musculoskeletal: Normal range of motion.  Mild painful range of motion the right shoulder and tenderness of the right proximal humerus.  No cervical spine tenderness.  Normal right radial pulse.  Normal grip strength in the right hand  Neurological: She is alert and oriented to person, place, and time.  Skin: Skin is warm and dry.  Psychiatric: She has a normal mood and  affect. Judgment normal.  Nursing note and vitals reviewed.    ED Treatments / Results  Labs (all labs ordered are listed, but only abnormal results are displayed) Labs Reviewed - No data to display  EKG  EKG Interpretation None       Radiology No results found.  Procedures Procedures (including critical care time)  Medications Ordered in ED Medications  ondansetron (ZOFRAN-ODT) disintegrating tablet 8 mg (8 mg Oral Given 08/26/17 0106)  ketorolac (TORADOL) injection 60 mg (60 mg Intramuscular Given 08/26/17 0106)     Initial Impression / Assessment and Plan / ED Course  I have reviewed the triage vital signs and the nursing notes.  Pertinent labs & imaging results that were available during my care of the patient were reviewed by me and considered in my medical decision making (see chart for details).    Orthopedic follow-up.  Patient will continue her hydrocodone and muscle relaxants.  Sling for comfort.  Final Clinical Impressions(s) / ED Diagnoses   Final diagnoses:  None    ED Discharge Orders    None       Jola Schmidt, MD 08/26/17 [phone]

## 2017-08-27 ENCOUNTER — Encounter (INDEPENDENT_AMBULATORY_CARE_PROVIDER_SITE_OTHER): Payer: Self-pay | Admitting: Orthopaedic Surgery

## 2017-08-27 ENCOUNTER — Ambulatory Visit (INDEPENDENT_AMBULATORY_CARE_PROVIDER_SITE_OTHER): Payer: Medicaid Other | Admitting: Orthopaedic Surgery

## 2017-08-27 VITALS — BP 141/86 | HR 90 | Ht 62.0 in | Wt 323.0 lb

## 2017-08-27 DIAGNOSIS — M7541 Impingement syndrome of right shoulder: Secondary | ICD-10-CM | POA: Diagnosis not present

## 2017-08-27 NOTE — Progress Notes (Signed)
Office Visit Note   Patient: [name]           Date of Birth: [birth year]           MRN: [id number] Visit Date: 08/27/2017              Requested by: Vladimir Creeks, MD [place], [place] [address] PCP: Sandi Mariscal, MD   Assessment & Plan: Visit Diagnoses:  1. Impingement syndrome of right shoulder     Plan: Injection performed with improvement in her pain.  We will check her back again in 3 weeks.  If she has persistent problems will consider diagnostic imaging.  We discussed the importance of checking her sugars and increasing her insulin if she has hyperglycemia secondary to the cortisone injection which could possibly last 5-6 days.  Follow-Up Instructions: Return in about 3 weeks (around 09/17/2017).   Orders:  Orders Placed This Encounter  Procedures  . Large Joint Inj: R subacromial bursa   No orders of the defined types were placed in this encounter.     Procedures: Large Joint Inj: R subacromial bursa on 08/27/2017 10:44 AM Indications: pain Details: 22 G 1.5 in needle  Arthrogram: No  Medications: 4 mL bupivacaine 0.25 %; 40 mg methylPREDNISolone acetate 40 MG/ML; 0.5 mL lidocaine 1 % Outcome: tolerated well, no immediate complications Procedure, treatment alternatives, risks and benefits explained, specific risks discussed. Consent was given by the patient. Immediately prior to procedure a time out was called to verify the correct patient, procedure, equipment, support staff and site/side marked as required. Patient was prepped and draped in the usual sterile fashion.       Clinical Data: No additional findings.   Subjective: Chief Complaint  Patient presents with  . Neck - Pain  . Right Shoulder - Pain    HPI 41 year old female seen with right arm after a fall that occurred before Thanksgiving.  Side of her neck.  Baclofen and hydrocodone 10 and states she has nausea and vomiting and cannot keep anything down.  The date  of injury was 11/17 and has been seen at urgent care as well as the hospital she states she cannot lay on her right side due to shoulder pain. Pain with dressing and lifting her arm up.  Cervical spine showed spondylosis cervical negative for fracture.  Shoulder x-ray showed no fracture or arthritis.  His x-rays were normal.  Patient is a diabetic on insulin.  Review of Systems positive for previous tibial plateau fracture, C-section x3 gallbladder removal.,  Depression ,hyperlipidemia, Cushing's disease, morbid obesity, hypothyroidism, osteoporosis hypertension, anxiety, cataracts, migraines, sleep apnea otherwise negative as it pertains to HPI.   Objective: Vital Signs: BP (!) 141/86   Pulse 90   Ht 5\' 2"  (1.575 m)   Wt (!) 323 lb (146.5 kg)   BMI 59.08 kg/m   Physical Exam  Constitutional: She is oriented to person, place, and time. She appears well-developed.  HENT:  Head: Normocephalic.  Right Ear: External ear normal.  Left Ear: External ear normal.  Eyes: Pupils are equal, round, and reactive to light.  Neck: No tracheal deviation present. No thyromegaly present.  Cardiovascular: Normal rate.  Pulmonary/Chest: Effort normal.  Abdominal: Soft.  Neurological: She is alert and oriented to person, place, and time.  Skin: Skin is warm and dry.  Psychiatric: She has a normal mood and affect. Her behavior is normal.    Ortho Exam healed incisions on the knee.Marland Kitchen  Positive impingement  right shoulder.  The biceps is minimally tender.  Cervical range of motion.  Upper extremity reflexes are 2+ and symmetrical.  Drop arm test.  Left shoulder shows no impingement.  Negative subluxation with external rotation.  Minimal brachial plexus tenderness negative Spurling.  Reflexes are 2+ lower extremities. Specialty Comments:  No specialty comments available.  Imaging: No results found.   PMFS History: Patient Active Problem List   Diagnosis Date Noted  . Abdominal pain, epigastric   . LUQ  abdominal pain   . Nausea and vomiting   . Gastroparesis   . Hypertriglyceridemia 07/22/2015  . Diabetes mellitus due to underlying condition, uncontrolled, with diabetic neuropathy, with long-term current use of insulin ([place])   . Vitamin D deficiency 07/19/2015  . 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 Osteoporosis 07/19/2015  . 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 Fracture, tibial plateau 07/16/2015  . Uncontrolled diabetes mellitus with diabetic neuropathy, with long-term current use of insulin ([place]) 07/16/2015  . Leukocytosis 07/16/2015  . Essential hypertension 07/16/2015  . Morbid obesity ([place]) 04/20/2014  . Hyperlipidemia 07/31/2012  . Cushing disease ([place]) 07/31/2012  . Hypothyroid 07/31/2012  . Major depressive disorder, recurrent episode, mild ([place]) 04/08/2012   Past Medical History:  Diagnosis Date  . Anxiety   . Complication of anesthesia    Pt. states takes long time to wake up from it.   . Cushing's disease ([place])   . Depression   . Diabetes ([place])   . Hyperlipidemia   . Hyperlipidemia   . Hypertension   . Morbid obesity ([place])   . Osteoporosis 07/19/2015  . 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 Sleep apnea   . Uncontrolled diabetes mellitus with diabetic neuropathy, with long-term current use of insulin ([place]) 07/16/2015  . Vitamin D deficiency 07/19/2015    Family History  Adopted: Yes  Problem Relation Age of Onset  . Autism Son   . ADD / ADHD Son   . Apraxia Son     Past Surgical History:  Procedure Laterality Date  . ANTERIOR TALOFIBULAR LIGAMENT REPAIR Left 11/15/2014   Procedure: ANTERIOR TALOFIBULAR LIGAMENT REPAIR;  Surgeon: Jana Half, DPM;  Location: [facility];  Service: Podiatry;  Laterality: Left;  . CESAREAN SECTION  dec 1997/  06-03-2001/   01-01-2005   BILATERAL  TUBAL LIGATION WITH LAST ONE  . DILATION AND CURETTAGE OF UTERUS  1995   WITH SUCTION  . ESOPHAGOGASTRODUODENOSCOPY (EGD) WITH PROPOFOL N/A 10/04/2016   Procedure: ESOPHAGOGASTRODUODENOSCOPY (EGD) WITH PROPOFOL;  Surgeon: [name], MD;  [facility];  Service: Endoscopy;  Laterality: N/A;  . LAPAROSCOPIC CHOLECYSTECTOMY  09-25-2005  . ORIF TIBIA PLATEAU Right 07/19/2015   Procedure: OPEN REDUCTION INTERNAL FIXATION (ORIF) RIGHT TIBIAL PLATEAU;  Surgeon: Altamese Jessup, MD;  Location: [facility];  Service: Orthopedics;  Laterality: Right;  . PARTIAL KNEE ARTHROPLASTY Right 04/19/2014   Procedure: RIGHT UNI KNEE ARTHROPLASTY MEDIALLY ;  Surgeon: Mauri Pole, MD;  [facility];  Service: Orthopedics;  Laterality: Right;  . TUBAL LIGATION     Social History   Occupational History  . Not on file  Tobacco Use  . Smoking status: Current Every Day Smoker    Years: 4.00    Types: Cigarettes  . Smokeless tobacco: Never Used  Substance and Sexual Activity  . Alcohol use: Yes    Comment: RARE  .  Drug use: No  . Sexual activity: Yes

## 2017-09-08 ENCOUNTER — Encounter (INDEPENDENT_AMBULATORY_CARE_PROVIDER_SITE_OTHER): Payer: Self-pay | Admitting: Orthopaedic Surgery

## 2017-09-08 MED ORDER — BUPIVACAINE HCL 0.25 % IJ SOLN
4.0000 mL | INTRAMUSCULAR | Status: AC | PRN
  Administered 2017-08-27: 4 mL via INTRA_ARTICULAR

## 2017-09-08 MED ORDER — METHYLPREDNISOLONE ACETATE 40 MG/ML IJ SUSP
40.0000 mg | INTRAMUSCULAR | Status: AC | PRN
  Administered 2017-08-27: 40 mg via INTRA_ARTICULAR

## 2017-09-08 MED ORDER — LIDOCAINE HCL 1 % IJ SOLN
0.5000 mL | INTRAMUSCULAR | Status: AC | PRN
  Administered 2017-08-27: .5 mL

## 2017-09-17 ENCOUNTER — Encounter (INDEPENDENT_AMBULATORY_CARE_PROVIDER_SITE_OTHER): Payer: Self-pay | Admitting: Orthopaedic Surgery

## 2017-09-17 ENCOUNTER — Ambulatory Visit (INDEPENDENT_AMBULATORY_CARE_PROVIDER_SITE_OTHER): Payer: Medicaid Other | Admitting: Orthopaedic Surgery

## 2017-09-17 VITALS — BP 136/92 | HR 98 | Ht 62.0 in | Wt 323.0 lb

## 2017-09-17 DIAGNOSIS — M7541 Impingement syndrome of right shoulder: Secondary | ICD-10-CM | POA: Diagnosis not present

## 2017-09-17 DIAGNOSIS — M5030 Other cervical disc degeneration, unspecified cervical region: Secondary | ICD-10-CM

## 2017-09-17 NOTE — Progress Notes (Signed)
Office Visit Note   Patient: [name]           Date of Birth: 1975/12/26           MRN: [id number] Visit Date: 09/17/2017              Requested by: Sandi Mariscal, [place], [place] [address] PCP: Sandi Mariscal, MD   Assessment & Plan: Visit Diagnoses:  1. Other cervical disc degeneration, unspecified cervical region     Plan: Patient can do some anti-inflammatory treatment as long as it does not bother her stomach.  She can use some topical cream.  I will check her back again in 6 weeks for recheck if she has persistent problems will consider proceeding with cervical MRI scan for evaluation of her disc degeneration.  Follow-Up Instructions: No Follow-up on file.   Orders:  No orders of the defined types were placed in this encounter.  No orders of the defined types were placed in this encounter.     Procedures: No procedures performed   Clinical Data: No additional findings.   Subjective: Chief Complaint  Patient presents with  . Neck - Pain, Follow-up  . Right Shoulder - Pain, Follow-up    HPI 42 year old female returns with ongoing problems after a fall around Thanksgiving with only temporary relief for a few days with the right shoulder subacromial injection.  She still has pain when she gets her arm up overhead but is also having problems with flaring of her cervical disc degeneration noted on previous x-rays with pain that radiates into her right shoulder down to the radial 3 fingers of her hand.  It does not wake her up at night.  She can turn to the left without pain.  She is had some problems with sleeping has a CPAP machine.  She states she had minimal problems with increased glucose after the subacromial injection.  Patient does not have problems with radial hand numbness at night but is taking sleeping medicine at night she states it knocks her out.  Review of Systems unchanged from 08/27/2017 office visit 14 point review of systems updated  other than as mentioned in HPI.   Objective: Vital Signs: BP (!) 136/92 (BP Location: Other (Comment)) Comment (BP Location): left forearm  Pulse 98   Ht 5\' 2"  (1.575 m)   Wt (!) 323 lb (146.5 kg)   BMI 59.08 kg/m   Physical Exam  Constitutional: She is oriented to person, place, and time. She appears well-developed.  HENT:  Head: Normocephalic.  Right Ear: External ear normal.  Left Ear: External ear normal.  Eyes: Pupils are equal, round, and reactive to light.  Neck: No tracheal deviation present. No thyromegaly present.  Cardiovascular: Normal rate.  Pulmonary/Chest: Effort normal.  Abdominal: Soft.  Neurological: She is alert and oriented to person, place, and time.  Skin: Skin is warm and dry.  Psychiatric: She has a normal mood and affect. Her behavior is normal.    Ortho Exam patient has pain with abduction right shoulder at 140 degrees.  Positive Neer test.  She has pain with rotation of her neck to the right.  Good forward flexion some brachial plexus tenderness on the right negative on the left no pain with rotation of the left.  She has some subjective decreased sensation radial 3 fingers with cervical rotation and Spurling test.   Specialty Comments:  No specialty comments available.  Imaging: No results found.   PMFS History: Patient  Active Problem List   Diagnosis Date Noted  . Abdominal pain, epigastric   . LUQ abdominal pain   . Nausea and vomiting   . Gastroparesis   . Hypertriglyceridemia 07/22/2015  . Diabetes mellitus due to underlying condition, uncontrolled, with diabetic neuropathy, with long-term current use of insulin ([place])   . Vitamin D deficiency 07/19/2015  . 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 Osteoporosis 07/19/2015  . 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 Fracture, tibial plateau 07/16/2015  . Uncontrolled diabetes mellitus with  diabetic neuropathy, with long-term current use of insulin ([place]) 07/16/2015  . Leukocytosis 07/16/2015  . Essential hypertension 07/16/2015  . Morbid obesity ([place]) 04/20/2014  . Hyperlipidemia 07/31/2012  . Cushing disease ([place]) 07/31/2012  . Hypothyroid 07/31/2012  . Major depressive disorder, recurrent episode, mild ([place]) 04/08/2012   Past Medical History:  Diagnosis Date  . Anxiety   . Complication of anesthesia    Pt. states takes long time to wake up from it.   . Cushing's disease ([place])   . Depression   . Diabetes ([place])   . Hyperlipidemia   . Hyperlipidemia   . Hypertension   . Morbid obesity ([place])   . Osteoporosis 07/19/2015  . 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 Sleep apnea   . Uncontrolled diabetes mellitus with diabetic neuropathy, with long-term current use of insulin ([place]) 07/16/2015  . Vitamin D deficiency 07/19/2015    Family History  Adopted: Yes  Problem Relation Age of Onset  . Autism Son   . ADD / ADHD Son   . Apraxia Son     Past Surgical History:  Procedure Laterality Date  . ANTERIOR TALOFIBULAR LIGAMENT REPAIR Left 11/15/2014   Procedure: ANTERIOR TALOFIBULAR LIGAMENT REPAIR;  Surgeon: Jana Half, DPM;  Location: [facility];  Service: Podiatry;  Laterality: Left;  . CESAREAN SECTION  dec 1997/  06-03-2001/   01-01-2005   BILATERAL TUBAL LIGATION WITH LAST ONE  . DILATION AND CURETTAGE OF UTERUS  1995   WITH SUCTION  . ESOPHAGOGASTRODUODENOSCOPY (EGD) WITH PROPOFOL N/A 10/04/2016   Procedure: ESOPHAGOGASTRODUODENOSCOPY (EGD) WITH PROPOFOL;  Surgeon: [name], MD;  [facility];  Service: Endoscopy;  Laterality: N/A;  . LAPAROSCOPIC CHOLECYSTECTOMY  09-25-2005  . ORIF TIBIA PLATEAU Right 07/19/2015   Procedure: OPEN REDUCTION INTERNAL FIXATION (ORIF) RIGHT TIBIAL PLATEAU;  Surgeon: Altamese Adel, MD;  Location: [facility];  Service: Orthopedics;  Laterality: Right;  .  PARTIAL KNEE ARTHROPLASTY Right 04/19/2014   Procedure: RIGHT UNI KNEE ARTHROPLASTY MEDIALLY ;  Surgeon: Mauri Pole, MD;  [facility];  Service: Orthopedics;  Laterality: Right;  . TUBAL LIGATION     Social History   Occupational History  . Not on file  Tobacco Use  . Smoking status: Current Every Day Smoker    Years: 4.00    Types: Cigarettes  . Smokeless tobacco: Never Used  Substance and Sexual Activity  . Alcohol use: Yes    Comment: RARE  . Drug use: No  . Sexual activity: Yes

## 2017-10-27 ENCOUNTER — Emergency Department (HOSPITAL_COMMUNITY)
Admission: EM | Admit: 2017-10-27 | Discharge: 2017-10-27 | Disposition: A | Discharge status: Home / Self Care | Payer: Medicaid Other | Attending: Emergency Medicine | Admitting: Emergency Medicine

## 2017-10-27 ENCOUNTER — Encounter (HOSPITAL_COMMUNITY): Payer: Self-pay

## 2017-10-27 ENCOUNTER — Emergency Department (HOSPITAL_COMMUNITY): Payer: Medicaid Other

## 2017-10-27 DIAGNOSIS — S8001XA Contusion of right knee, initial encounter: Secondary | ICD-10-CM

## 2017-10-27 DIAGNOSIS — S91112A Laceration without foreign body of left great toe without damage to nail, initial encounter: Secondary | ICD-10-CM | POA: Diagnosis not present

## 2017-10-27 DIAGNOSIS — F17210 Nicotine dependence, cigarettes, uncomplicated: Secondary | ICD-10-CM | POA: Diagnosis not present

## 2017-10-27 DIAGNOSIS — Y9389 Activity, other specified: Secondary | ICD-10-CM | POA: Insufficient documentation

## 2017-10-27 DIAGNOSIS — E1140 Type 2 diabetes mellitus with diabetic neuropathy, unspecified: Secondary | ICD-10-CM | POA: Diagnosis not present

## 2017-10-27 DIAGNOSIS — Z79899 Other long term (current) drug therapy: Secondary | ICD-10-CM | POA: Diagnosis not present

## 2017-10-27 DIAGNOSIS — S8991XA Unspecified injury of right lower leg, initial encounter: Secondary | ICD-10-CM | POA: Diagnosis present

## 2017-10-27 DIAGNOSIS — I10 Essential (primary) hypertension: Secondary | ICD-10-CM | POA: Diagnosis not present

## 2017-10-27 DIAGNOSIS — Z96651 Presence of right artificial knee joint: Secondary | ICD-10-CM | POA: Diagnosis not present

## 2017-10-27 DIAGNOSIS — W51XXXA Accidental striking against or bumped into by another person, initial encounter: Secondary | ICD-10-CM | POA: Diagnosis not present

## 2017-10-27 DIAGNOSIS — Y929 Unspecified place or not applicable: Secondary | ICD-10-CM | POA: Diagnosis not present

## 2017-10-27 DIAGNOSIS — Z23 Encounter for immunization: Secondary | ICD-10-CM | POA: Diagnosis not present

## 2017-10-27 DIAGNOSIS — Z794 Long term (current) use of insulin: Secondary | ICD-10-CM | POA: Insufficient documentation

## 2017-10-27 DIAGNOSIS — Y999 Unspecified external cause status: Secondary | ICD-10-CM | POA: Diagnosis not present

## 2017-10-27 DIAGNOSIS — Z7902 Long term (current) use of antithrombotics/antiplatelets: Secondary | ICD-10-CM | POA: Insufficient documentation

## 2017-10-27 MED ORDER — TETANUS-DIPHTH-ACELL PERTUSSIS 5-2.5-18.5 LF-MCG/0.5 IM SUSP
0.5000 mL | Freq: Once | INTRAMUSCULAR | Status: AC
  Administered 2017-10-27: 0.5 mL via INTRAMUSCULAR
  Filled 2017-10-27: qty 0.5

## 2017-10-27 MED ORDER — TRAMADOL HCL 50 MG PO TABS: 50.0000 mg | ORAL_TABLET | Freq: Four times a day (QID) | ORAL | 0 refills | Status: DC | PRN

## 2017-10-27 MED ORDER — DOXYCYCLINE HYCLATE 100 MG PO CAPS: 100.0000 mg | ORAL_CAPSULE | Freq: Two times a day (BID) | ORAL | 0 refills | Status: DC

## 2017-10-27 MED ORDER — BACITRACIN ZINC 500 UNIT/GM EX OINT
TOPICAL_OINTMENT | Freq: Two times a day (BID) | CUTANEOUS | Status: DC
  Administered 2017-10-27: 1 via TOPICAL
  Filled 2017-10-27: qty 0.9
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
## 2017-10-27 NOTE — Discharge Instructions (Signed)
Follow up with your doctor. Return here as needed for worsening symptoms.

## 2017-10-29 ENCOUNTER — Ambulatory Visit (INDEPENDENT_AMBULATORY_CARE_PROVIDER_SITE_OTHER): Payer: Medicaid Other | Admitting: Orthopaedic Surgery

## 2017-10-29 ENCOUNTER — Encounter (INDEPENDENT_AMBULATORY_CARE_PROVIDER_SITE_OTHER): Payer: Self-pay | Admitting: Orthopaedic Surgery

## 2017-10-29 VITALS — BP 139/86 | HR 102 | Ht 62.0 in | Wt 312.0 lb

## 2017-10-29 DIAGNOSIS — M47812 Spondylosis without myelopathy or radiculopathy, cervical region: Secondary | ICD-10-CM

## 2017-10-29 DIAGNOSIS — S8011XA Contusion of right lower leg, initial encounter: Secondary | ICD-10-CM | POA: Diagnosis not present

## 2017-10-29 DIAGNOSIS — S8001XA Contusion of right knee, initial encounter: Secondary | ICD-10-CM | POA: Diagnosis not present

## 2017-10-29 NOTE — Addendum Note (Signed)
Addended by: Meyer Cory on: 10/29/2017 10:01 AM   Modules accepted: Orders
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
## 2017-11-03 ENCOUNTER — Emergency Department: Admit: 2017-11-04 | Payer: BLUE CROSS/BLUE SHIELD

## 2017-11-03 DIAGNOSIS — R05 Cough: Secondary | ICD-10-CM

## 2017-11-03 NOTE — ED Provider Notes (Signed)
[facility]  ED  eMERGENCYdEPARTMENT eNCOUnter      Pt Name: [name] [name]  MRN: [id number]  Birthdate 10-Mar-1976  Date of evaluation: 11/03/2017  Provider:Damaris Abeln Selena BattenKim, MD    CHIEF COMPLAINT       Chief Complaint   Patient presents with   . Cough     has had a cold past few days.  cough worse yesterday.  went to urgent care and given breathing treatment and prescribed meds.  States not feeling any better and can't get any sleep          HISTORY OF PRESENT ILLNESS   (Location/Symptom, Timing/Onset,Context/Setting, Quality, Duration, Modifying Factors, Severity)  Note limiting factors.   [name] [name] is a 42 y.o. female with no significant medical history who presents to the emergency department for cough. Patient reports sinus headache, cough, rhinorrhea x 1 month. Most of the symptoms have resolved, though cough continues. States cough is keeping her up at night, has tried cough drops and nyquil/dayquil with no improvement. Endorses fever for past few days (tmax 101), took ibuprofen at 9pm. Was seen at urgent care, given prescriptions (prednisone, tessalon perls, doxycycline, albuterol inhaler) and breathing treatment with no improvement.     HPI    Nursing Notes were reviewed.    REVIEW OF SYSTEMS    (2-9 systems for level 4, 10 or more for level 5)     Review of Systems   Constitutional: Positive for fever. Negative for activity change, appetite change and diaphoresis.   HENT: Negative for congestion, rhinorrhea, sore throat and trouble swallowing.    Respiratory: Positive for cough. Negative for chest tightness, shortness of breath, wheezing and stridor.    Cardiovascular: Negative for chest pain and palpitations.   Gastrointestinal: Negative for abdominal pain, diarrhea, nausea and vomiting.   Genitourinary: Negative for dysuria and flank pain.   Skin: Negative for rash and wound.   Neurological: Negative for dizziness, weakness, light-headedness, numbness and headaches.       Except as noted above the  remainder of the review of systems was reviewedand negative.       PAST MEDICAL HISTORY     Past Medical History:   Diagnosis Date   . Hyperlipidemia 04/16/2014   . Hypothyroid 11/06/2012         SURGICAL HISTORY     History reviewed. No pertinent surgical history.      CURRENT MEDICATIONS       Discharge Medication List as of 11/03/2017 11:42 PM      CONTINUE these medications which have NOT CHANGED    Details   albuterol sulfate HFA 108 (90 Base) MCG/ACT inhaler Historical Med      benzonatate (TESSALON) 100 MG capsule Historical Med      doxycycline hyclate (VIBRAMYCIN) 100 MG capsule Historical Med      predniSONE (DELTASONE) 20 MG tablet Historical Med      diclofenac (VOLTAREN) 75 MG EC tablet Take 1 tablet by mouth 2 times daily, Disp-60 tablet, R-3Normal             ALLERGIES     Phenergan [promethazine hcl]    FAMILY HISTORY       Family History   Problem Relation Age of Onset   . High Blood Pressure Father    . Stroke Father    . Substance Abuse Father         Alcohol   . Cancer Maternal Aunt         Pancreatic   .  Heart Disease Maternal Aunt    . Diabetes Paternal Grandmother    . Heart Disease Paternal Grandfather         MI   . Stroke Paternal Grandfather           SOCIAL HISTORY       Social History     Social History   . Marital status: Married     Spouse name: N/A   . Number of children: N/A   . Years of education: N/A     Social History Main Topics   . Smoking status: Never Smoker   . Smokeless tobacco: Never Used   . Alcohol use Yes      Comment: socially    . Drug use: No   . Sexual activity: Yes     Partners: Male     Other Topics Concern   . None     Social History Narrative   . None       SCREENINGS             PHYSICAL EXAM    (up to 7 for level 4, 8 ormore for level 5)     ED Triage Vitals [11/03/17 2151]   BP Temp Temp Source Pulse Resp SpO2 Height Weight   124/66 (S) 98.4 F (36.9 C) Oral 96 18 94 % 5\' 9"  (1.753 m) 190 lb (86.2 kg)       Physical Exam   Constitutional: She is oriented to  person, place, and time. Vital signs are normal. She appears well-developed and well-nourished. She is cooperative.  Non-toxic appearance. She does not have a sickly appearance. She does not appear ill. No distress.   Sitting in bed comfortably, speaking in full sentences, following verbal commands appropriately. Not in acute distress     HENT:   Head: Normocephalic and atraumatic.   Nose: Right sinus exhibits frontal sinus tenderness. Left sinus exhibits frontal sinus tenderness.   Mouth/Throat: Oropharynx is clear and moist and mucous membranes are normal.   Eyes: Pupils are equal, round, and reactive to light. Conjunctivae and EOM are normal.   Neck: Normal range of motion. Neck supple.   Cardiovascular: Normal rate, regular rhythm, normal heart sounds and intact distal pulses.  Exam reveals no gallop and no friction rub.    No murmur heard.  Pulmonary/Chest: Effort normal and breath sounds normal. No respiratory distress. She has no decreased breath sounds. She has no wheezes. She has no rhonchi. She has no rales.   Abdominal: Soft. Normal appearance and bowel sounds are normal. She exhibits no distension. There is no tenderness. There is no rebound and no guarding.   Musculoskeletal: Normal range of motion. She exhibits no edema, tenderness or deformity.   Neurological: She is alert and oriented to person, place, and time. GCS eye subscore is 4. GCS verbal subscore is 5. GCS motor subscore is 6.   Skin: Skin is warm and dry. No rash noted.       DIAGNOSTIC RESULTS     EKG: All EKG's are interpreted by the Emergency Department Physicianwho either signs or Co-signs this chart in the absence of a cardiologist.      RADIOLOGY:   Non-plain film images such as CT, Ultrasound and MRI are read by the radiologist. Plain radiographic images are visualized and preliminarily interpreted by the emergency physician with the below findings:      Interpretation per the Radiologist below, if available at the time of this  note:  XR CHEST STANDARD (2 VW)   Final Result   No acute disease.               ED BEDSIDE ULTRASOUND:   Performed by ED Physician - none    LABS:  Labs Reviewed   RSV RAPID ANTIGEN    Narrative:     Performed at:  [facility]  [address]  [place], [place] [address]   Phone [phone]       All other labs were within normal range ornot returned as of this dictation.    EMERGENCY DEPARTMENT COURSE and DIFFERENTIAL DIAGNOSIS/MDM:   Vitals:    Vitals:    11/03/17 2151 11/03/17 2245 11/03/17 2358   BP: 124/66 127/69    Pulse: 96 93 92   Resp: 18 20 19    Temp: (S) 98.4 F (36.9 C)     TempSrc: Oral     SpO2: 94% 97% 98%   Weight: 190 lb (86.2 kg)     Height: 5\' 9"  (1.753 m)           MDM    ED COURSE/MDM    -[name] [name] is a 43 y.o. female with no significant medical history presents to ED for cough x 1 month  -was seen at urgent care yesterday and discharged home with prednisone, tessalon perl, doxycycline and albuterol inhaler. Has taken one dose of medications  -CXR: no acute cardiopulmonary process  -flu negative yesterday at urgent care  -Patient was given guaifenesin, tessalon perls in the ED with good symptomatic relief. Patient was reassessed as noted above.  -RSV negative  -Explained to patient that sometimes medications can take some time to work. Cough may be due to viral syndrome and will need time to pass, however encouraged patient to continue taking medications as prescribed yesterday.   -Noacute pathology was noted and plan for discharge home with close follow up with PCP was discussed with patient and family. Strict ED return precautions given for new/worsending symptoms. Patient and family in agreement withplan, verbally confirm understanding and have no further questions/concerns.         REASSESSMENT      Well appearing, non toxic, alert, oriented speaking in full sentences and hemodynamically stable upon discharge      CRITICAL CARE TIME   Total Critical Care  time was 0 minutes, excluding separately reportableprocedures.  There was a high probability of clinicallysignificant/life threatening deterioration in the patient's condition which required my urgent intervention.      CONSULTS:  None    PROCEDURES:  Unless otherwise noted below, none     Procedures    FINAL IMPRESSION      1. Cough          DISPOSITION/PLAN   DISPOSITION        PATIENT REFERREDTO:  Randell Patient Ngu, MD  [address]  [address]  [place] [address]  [phone]    Call in 2 days        DISCHARGE MEDICATIONS:  Discharge Medication List as of 11/03/2017 11:42 PM             (Please note that portions of this note were completed with a voice recognition program.  Efforts were made to edit the dictations but occasionally wordsare mis-transcribed.)    Gweneth Dimitri, MD (electronically signed)  Attending Emergency Physician            Gweneth Dimitri, MD  11/04/17 [phone]

## 2017-11-03 NOTE — ED Notes (Signed)
Findings and plan of care discussed at bedside by provider.  Pt verbalized understanding of plan of care, pt discharged with all instructions reviewed and any prescriptions as applicable. All belongings in hand including discharge instructions, work note, and personal belongings. Pt in no acute distress.     Rosita FireKristin Lee Kaulin Chaves, RN  11/03/17 [phone]

## 2017-11-03 NOTE — ED Notes (Signed)
All results in      Doroteo GlassmanKristin Lee Pine Hill[place], [place]RN  11/03/17 2220

## 2017-11-04 ENCOUNTER — Inpatient Hospital Stay
Admit: 2017-11-04 | Discharge: 2017-11-04 | Disposition: A | Discharge status: Home / Self Care | Payer: BLUE CROSS/BLUE SHIELD | Attending: Emergency Medicine

## 2017-11-04 LAB — RSV RAPID ANTIGEN: RSV Rapid Ag: NEGATIVE

## 2017-11-04 MED ORDER — GUAIFENESIN 100 MG/5ML PO SOLN
100 MG/5ML | Freq: Once | ORAL | Status: AC
Start: 2017-11-04 — End: 2017-11-03
  Administered 2017-11-04: 04:00:00 200 mg via ORAL

## 2017-11-04 MED ORDER — BENZONATATE 100 MG PO CAPS
100 MG | Freq: Three times a day (TID) | ORAL | Status: DC | PRN
Start: 2017-11-04 — End: 2017-11-04
  Administered 2017-11-03: 06:00:00 100 mg via ORAL

## 2017-11-04 MED FILL — GUAIFENESIN 100 MG/5ML PO SOLN: 100 MG/5ML | ORAL | Qty: 10

## 2017-11-04 MED FILL — BENZONATATE 100 MG PO CAPS: 100 mg | ORAL | Qty: 1

## 2017-11-16 ENCOUNTER — Other Ambulatory Visit: Payer: Medicaid Other

## 2017-11-20 ENCOUNTER — Emergency Department (HOSPITAL_COMMUNITY): Payer: Medicaid Other

## 2017-11-20 ENCOUNTER — Other Ambulatory Visit: Payer: Self-pay

## 2017-11-20 ENCOUNTER — Inpatient Hospital Stay (HOSPITAL_COMMUNITY): Payer: Medicaid Other

## 2017-11-20 ENCOUNTER — Inpatient Hospital Stay (HOSPITAL_COMMUNITY)
Admission: EM | Admit: 2017-11-20 | Discharge: 2018-01-07 | DRG: 004 | Disposition: A | Discharge status: Other Acute Inpatient Hospital | Payer: Medicaid Other | Attending: Family Medicine | Admitting: Family Medicine

## 2017-11-20 ENCOUNTER — Ambulatory Visit (INDEPENDENT_AMBULATORY_CARE_PROVIDER_SITE_OTHER): Payer: Medicaid Other | Admitting: Orthopaedic Surgery

## 2017-11-20 ENCOUNTER — Encounter (HOSPITAL_COMMUNITY): Payer: Self-pay | Admitting: Emergency Medicine

## 2017-11-20 ENCOUNTER — Other Ambulatory Visit (HOSPITAL_COMMUNITY): Payer: Medicaid Other

## 2017-11-20 DIAGNOSIS — F1290 Cannabis use, unspecified, uncomplicated: Secondary | ICD-10-CM | POA: Diagnosis not present

## 2017-11-20 DIAGNOSIS — I10 Essential (primary) hypertension: Secondary | ICD-10-CM | POA: Diagnosis present

## 2017-11-20 DIAGNOSIS — Z794 Long term (current) use of insulin: Secondary | ICD-10-CM

## 2017-11-20 DIAGNOSIS — E249 Cushing's syndrome, unspecified: Secondary | ICD-10-CM | POA: Diagnosis present

## 2017-11-20 DIAGNOSIS — J9621 Acute and chronic respiratory failure with hypoxia: Secondary | ICD-10-CM | POA: Diagnosis not present

## 2017-11-20 DIAGNOSIS — J9601 Acute respiratory failure with hypoxia: Secondary | ICD-10-CM

## 2017-11-20 DIAGNOSIS — F411 Generalized anxiety disorder: Secondary | ICD-10-CM | POA: Diagnosis present

## 2017-11-20 DIAGNOSIS — Z79899 Other long term (current) drug therapy: Secondary | ICD-10-CM

## 2017-11-20 DIAGNOSIS — N179 Acute kidney failure, unspecified: Secondary | ICD-10-CM | POA: Diagnosis present

## 2017-11-20 DIAGNOSIS — J189 Pneumonia, unspecified organism: Secondary | ICD-10-CM

## 2017-11-20 DIAGNOSIS — I959 Hypotension, unspecified: Secondary | ICD-10-CM | POA: Diagnosis present

## 2017-11-20 DIAGNOSIS — M7989 Other specified soft tissue disorders: Secondary | ICD-10-CM | POA: Diagnosis not present

## 2017-11-20 DIAGNOSIS — E1165 Type 2 diabetes mellitus with hyperglycemia: Secondary | ICD-10-CM | POA: Diagnosis present

## 2017-11-20 DIAGNOSIS — M810 Age-related osteoporosis without current pathological fracture: Secondary | ICD-10-CM | POA: Diagnosis present

## 2017-11-20 DIAGNOSIS — T43212A Poisoning by selective serotonin and norepinephrine reuptake inhibitors, intentional self-harm, initial encounter: Secondary | ICD-10-CM | POA: Diagnosis present

## 2017-11-20 DIAGNOSIS — J810 Acute pulmonary edema: Secondary | ICD-10-CM | POA: Diagnosis present

## 2017-11-20 DIAGNOSIS — M549 Dorsalgia, unspecified: Secondary | ICD-10-CM | POA: Diagnosis not present

## 2017-11-20 DIAGNOSIS — E1140 Type 2 diabetes mellitus with diabetic neuropathy, unspecified: Secondary | ICD-10-CM | POA: Diagnosis present

## 2017-11-20 DIAGNOSIS — G92 Toxic encephalopathy: Secondary | ICD-10-CM | POA: Diagnosis present

## 2017-11-20 DIAGNOSIS — Z818 Family history of other mental and behavioral disorders: Secondary | ICD-10-CM | POA: Diagnosis not present

## 2017-11-20 DIAGNOSIS — E039 Hypothyroidism, unspecified: Secondary | ICD-10-CM | POA: Diagnosis present

## 2017-11-20 DIAGNOSIS — J9602 Acute respiratory failure with hypercapnia: Secondary | ICD-10-CM | POA: Insufficient documentation

## 2017-11-20 DIAGNOSIS — E871 Hypo-osmolality and hyponatremia: Secondary | ICD-10-CM | POA: Diagnosis not present

## 2017-11-20 DIAGNOSIS — J811 Chronic pulmonary edema: Secondary | ICD-10-CM | POA: Diagnosis present

## 2017-11-20 DIAGNOSIS — R4587 Impulsiveness: Secondary | ICD-10-CM | POA: Diagnosis not present

## 2017-11-20 DIAGNOSIS — G4733 Obstructive sleep apnea (adult) (pediatric): Secondary | ICD-10-CM | POA: Diagnosis present

## 2017-11-20 DIAGNOSIS — M79609 Pain in unspecified limb: Secondary | ICD-10-CM | POA: Diagnosis not present

## 2017-11-20 DIAGNOSIS — J383 Other diseases of vocal cords: Secondary | ICD-10-CM | POA: Diagnosis not present

## 2017-11-20 DIAGNOSIS — G40401 Other generalized epilepsy and epileptic syndromes, not intractable, with status epilepticus: Secondary | ICD-10-CM | POA: Diagnosis present

## 2017-11-20 DIAGNOSIS — F329 Major depressive disorder, single episode, unspecified: Secondary | ICD-10-CM

## 2017-11-20 DIAGNOSIS — G40901 Epilepsy, unspecified, not intractable, with status epilepticus: Secondary | ICD-10-CM | POA: Diagnosis present

## 2017-11-20 DIAGNOSIS — R569 Unspecified convulsions: Secondary | ICD-10-CM | POA: Insufficient documentation

## 2017-11-20 DIAGNOSIS — R34 Anuria and oliguria: Secondary | ICD-10-CM

## 2017-11-20 DIAGNOSIS — G4700 Insomnia, unspecified: Secondary | ICD-10-CM | POA: Diagnosis present

## 2017-11-20 DIAGNOSIS — F332 Major depressive disorder, recurrent severe without psychotic features: Secondary | ICD-10-CM | POA: Diagnosis not present

## 2017-11-20 DIAGNOSIS — J386 Stenosis of larynx: Secondary | ICD-10-CM | POA: Diagnosis not present

## 2017-11-20 DIAGNOSIS — I872 Venous insufficiency (chronic) (peripheral): Secondary | ICD-10-CM | POA: Diagnosis present

## 2017-11-20 DIAGNOSIS — R0600 Dyspnea, unspecified: Secondary | ICD-10-CM

## 2017-11-20 DIAGNOSIS — T50902A Poisoning by unspecified drugs, medicaments and biological substances, intentional self-harm, initial encounter: Secondary | ICD-10-CM | POA: Diagnosis present

## 2017-11-20 DIAGNOSIS — E875 Hyperkalemia: Secondary | ICD-10-CM | POA: Diagnosis not present

## 2017-11-20 DIAGNOSIS — J384 Edema of larynx: Secondary | ICD-10-CM | POA: Diagnosis not present

## 2017-11-20 DIAGNOSIS — F32A Depression, unspecified: Secondary | ICD-10-CM

## 2017-11-20 DIAGNOSIS — J387 Other diseases of larynx: Secondary | ICD-10-CM | POA: Diagnosis present

## 2017-11-20 DIAGNOSIS — T404X2A Poisoning by other synthetic narcotics, intentional self-harm, initial encounter: Secondary | ICD-10-CM | POA: Diagnosis present

## 2017-11-20 DIAGNOSIS — F17210 Nicotine dependence, cigarettes, uncomplicated: Secondary | ICD-10-CM | POA: Diagnosis present

## 2017-11-20 DIAGNOSIS — Z6841 Body Mass Index (BMI) 40.0 and over, adult: Secondary | ICD-10-CM | POA: Diagnosis not present

## 2017-11-20 DIAGNOSIS — Z96651 Presence of right artificial knee joint: Secondary | ICD-10-CM | POA: Diagnosis present

## 2017-11-20 DIAGNOSIS — F419 Anxiety disorder, unspecified: Secondary | ICD-10-CM | POA: Diagnosis not present

## 2017-11-20 DIAGNOSIS — R490 Dysphonia: Secondary | ICD-10-CM

## 2017-11-20 DIAGNOSIS — R451 Restlessness and agitation: Secondary | ICD-10-CM | POA: Diagnosis not present

## 2017-11-20 DIAGNOSIS — R51 Headache: Secondary | ICD-10-CM | POA: Diagnosis not present

## 2017-11-20 DIAGNOSIS — J9690 Respiratory failure, unspecified, unspecified whether with hypoxia or hypercapnia: Secondary | ICD-10-CM

## 2017-11-20 DIAGNOSIS — R609 Edema, unspecified: Secondary | ICD-10-CM | POA: Diagnosis not present

## 2017-11-20 DIAGNOSIS — T428X2A Poisoning by antiparkinsonism drugs and other central muscle-tone depressants, intentional self-harm, initial encounter: Secondary | ICD-10-CM | POA: Diagnosis not present

## 2017-11-20 DIAGNOSIS — Z810 Family history of intellectual disabilities: Secondary | ICD-10-CM | POA: Diagnosis not present

## 2017-11-20 DIAGNOSIS — Z930 Tracheostomy status: Secondary | ICD-10-CM

## 2017-11-20 DIAGNOSIS — Z9119 Patient's noncompliance with other medical treatment and regimen: Secondary | ICD-10-CM

## 2017-11-20 DIAGNOSIS — T1491XA Suicide attempt, initial encounter: Secondary | ICD-10-CM | POA: Diagnosis not present

## 2017-11-20 DIAGNOSIS — E8770 Fluid overload, unspecified: Secondary | ICD-10-CM | POA: Diagnosis present

## 2017-11-20 DIAGNOSIS — E785 Hyperlipidemia, unspecified: Secondary | ICD-10-CM | POA: Diagnosis present

## 2017-11-20 DIAGNOSIS — R450 Nervousness: Secondary | ICD-10-CM | POA: Diagnosis not present

## 2017-11-20 DIAGNOSIS — Z880 Allergy status to penicillin: Secondary | ICD-10-CM

## 2017-11-20 DIAGNOSIS — R0602 Shortness of breath: Secondary | ICD-10-CM

## 2017-11-20 LAB — HEPATIC FUNCTION PANEL
ALT: 50 U/L (ref 14–54)
AST: 46 U/L — ABNORMAL HIGH (ref 15–41)
Albumin: 3.3 g/dL — ABNORMAL LOW (ref 3.5–5.0)
Alkaline Phosphatase: 123 U/L (ref 38–126)
BILIRUBIN DIRECT: 0.2 mg/dL (ref 0.1–0.5)
BILIRUBIN INDIRECT: 0.7 mg/dL (ref 0.3–0.9)
BILIRUBIN TOTAL: 0.9 mg/dL (ref 0.3–1.2)
Total Protein: 6.3 g/dL — ABNORMAL LOW (ref 6.5–8.1)

## 2017-11-20 LAB — SALICYLATE LEVEL

## 2017-11-20 LAB — BLOOD GAS, ARTERIAL
Acid-base deficit: 3.4 mmol/L — ABNORMAL HIGH (ref 0.0–2.0)
Bicarbonate: 20.7 mmol/L (ref 20.0–28.0)
DRAWN BY: 51147
FIO2: 50
O2 Saturation: 98.2 %
PEEP: 5 cmH2O
PH ART: 7.392 (ref 7.350–7.450)
Patient temperature: 98.6
RATE: 24 resp/min
VT: 420 mL
pCO2 arterial: 34.7 mmHg (ref 32.0–48.0)
pO2, Arterial: 115 mmHg — ABNORMAL HIGH (ref 83.0–108.0)

## 2017-11-20 LAB — COMPREHENSIVE METABOLIC PANEL
ALK PHOS: 111 U/L (ref 38–126)
ALT: 26 U/L (ref 14–54)
AST: 33 U/L (ref 15–41)
Albumin: 3.9 g/dL (ref 3.5–5.0)
Anion gap: 14 (ref 5–15)
BILIRUBIN TOTAL: 0.8 mg/dL (ref 0.3–1.2)
BUN: 21 mg/dL — AB (ref 6–20)
CALCIUM: 9.7 mg/dL (ref 8.9–10.3)
CHLORIDE: 105 mmol/L (ref 101–111)
CO2: 19 mmol/L — ABNORMAL LOW (ref 22–32)
CREATININE: 1.27 mg/dL — AB (ref 0.44–1.00)
GFR, EST AFRICAN AMERICAN: 59 mL/min — AB (ref >60)
GFR, EST NON AFRICAN AMERICAN: 51 mL/min — AB (ref >60)
Glucose, Bld: 224 mg/dL — ABNORMAL HIGH (ref 65–99)
Potassium: 4 mmol/L (ref 3.5–5.1)
Sodium: 138 mmol/L (ref 135–145)
Total Protein: 7.4 g/dL (ref 6.5–8.1)

## 2017-11-20 LAB — CBC
HCT: 42.5 % (ref 36.0–46.0)
HCT: 39.3 % (ref 36.0–46.0)
Hemoglobin: 14.2 g/dL (ref 12.0–15.0)
Hemoglobin: 12.6 g/dL (ref 12.0–15.0)
MCH: 30 pg (ref 26.0–34.0)
MCH: 28.5 pg (ref 26.0–34.0)
MCHC: 33.4 g/dL (ref 30.0–36.0)
MCHC: 32.1 g/dL (ref 30.0–36.0)
MCV: 89.9 fL (ref 78.0–100.0)
MCV: 88.9 fL (ref 78.0–100.0)
PLATELETS: 402 10*3/uL — AB (ref 150–400)
Platelets: 338 10*3/uL (ref 150–400)
RBC: 4.73 MIL/uL (ref 3.87–5.11)
RBC: 4.42 MIL/uL (ref 3.87–5.11)
RDW: 12.7 % (ref 11.5–15.5)
RDW: 12.8 % (ref 11.5–15.5)
WBC: 13.3 10*3/uL — AB (ref 4.0–10.5)
WBC: 9.5 10*3/uL (ref 4.0–10.5)

## 2017-11-20 LAB — RENAL FUNCTION PANEL
Albumin: 3.3 g/dL — ABNORMAL LOW (ref 3.5–5.0)
Anion gap: 11 (ref 5–15)
BUN: 24 mg/dL — AB (ref 6–20)
CHLORIDE: 108 mmol/L (ref 101–111)
CO2: 19 mmol/L — AB (ref 22–32)
CREATININE: 1.57 mg/dL — AB (ref 0.44–1.00)
Calcium: 8.7 mg/dL — ABNORMAL LOW (ref 8.9–10.3)
GFR calc Af Amer: 46 mL/min — ABNORMAL LOW (ref >60)
GFR calc non Af Amer: 40 mL/min — ABNORMAL LOW (ref >60)
Glucose, Bld: 211 mg/dL — ABNORMAL HIGH (ref 65–99)
Phosphorus: 3.2 mg/dL (ref 2.5–4.6)
Potassium: 4.7 mmol/L (ref 3.5–5.1)
Sodium: 138 mmol/L (ref 135–145)

## 2017-11-20 LAB — I-STAT ARTERIAL BLOOD GAS, ED
ACID-BASE DEFICIT: 11 mmol/L — AB (ref 0.0–2.0)
BICARBONATE: 17.7 mmol/L — AB (ref 20.0–28.0)
O2 Saturation: 100 %
PO2 ART: 229 mmHg — AB (ref 83.0–108.0)
Patient temperature: 98.5
TCO2: 19 mmol/L — ABNORMAL LOW (ref 22–32)
pCO2 arterial: 52 mmHg — ABNORMAL HIGH (ref 32.0–48.0)
pH, Arterial: 7.14 — CL (ref 7.350–7.450)

## 2017-11-20 LAB — RAPID URINE DRUG SCREEN, HOSP PERFORMED
Amphetamines: NOT DETECTED
Barbiturates: NOT DETECTED
Benzodiazepines: NOT DETECTED
COCAINE: NOT DETECTED
Opiates: NOT DETECTED
Tetrahydrocannabinol: NOT DETECTED

## 2017-11-20 LAB — I-STAT BETA HCG BLOOD, ED (MC, WL, AP ONLY)

## 2017-11-20 LAB — MAGNESIUM
MAGNESIUM: 1.9 mg/dL (ref 1.7–2.4)
Magnesium: 1.7 mg/dL (ref 1.7–2.4)

## 2017-11-20 LAB — HIV ANTIBODY (ROUTINE TESTING W REFLEX): HIV SCREEN 4TH GENERATION: NONREACTIVE

## 2017-11-20 LAB — GLUCOSE, CAPILLARY
GLUCOSE-CAPILLARY: 207 mg/dL — AB (ref 65–99)
Glucose-Capillary: 224 mg/dL — ABNORMAL HIGH (ref 65–99)
Glucose-Capillary: 200 mg/dL — ABNORMAL HIGH (ref 65–99)
Glucose-Capillary: 243 mg/dL — ABNORMAL HIGH (ref 65–99)
Glucose-Capillary: 262 mg/dL — ABNORMAL HIGH (ref 65–99)

## 2017-11-20 LAB — HEMOGLOBIN A1C
HEMOGLOBIN A1C: 7.2 % — AB (ref 4.8–5.6)
Mean Plasma Glucose: 159.94 mg/dL

## 2017-11-20 LAB — ETHANOL

## 2017-11-20 LAB — CK: CK TOTAL: 53 U/L (ref 38–234)

## 2017-11-20 LAB — CORTISOL: CORTISOL PLASMA: 10.6 ug/dL

## 2017-11-20 LAB — TSH: TSH: 3.587 u[IU]/mL (ref 0.350–4.500)

## 2017-11-20 LAB — PHOSPHORUS: PHOSPHORUS: 3.9 mg/dL (ref 2.5–4.6)

## 2017-11-20 LAB — ACETAMINOPHEN LEVEL

## 2017-11-20 LAB — CBG MONITORING, ED: GLUCOSE-CAPILLARY: 199 mg/dL — AB (ref 65–99)

## 2017-11-20 LAB — MRSA PCR SCREENING: MRSA BY PCR: NEGATIVE

## 2017-11-20 MED ORDER — PRO-STAT SUGAR FREE PO LIQD
30.0000 mL | Freq: Three times a day (TID) | ORAL | Status: DC
  Administered 2017-11-20 – 2017-11-21 (×5): 30 mL
  Filled 2017-11-20 (×5): qty 30

## 2017-11-20 MED ORDER — METHYLPREDNISOLONE SODIUM SUCC 40 MG IJ SOLR
40.0000 mg | Freq: Two times a day (BID) | INTRAMUSCULAR | Status: DC
  Administered 2017-11-20 – 2017-11-21 (×2): 40 mg via INTRAVENOUS
  Filled 2017-11-20 (×2): qty 1

## 2017-11-20 MED ORDER — CHLORHEXIDINE GLUCONATE 0.12% ORAL RINSE (MEDLINE KIT)
15.0000 mL | Freq: Two times a day (BID) | OROMUCOSAL | Status: DC
  Administered 2017-11-20: 15 mL via OROMUCOSAL

## 2017-11-20 MED ORDER — MIDAZOLAM HCL 2 MG/2ML IJ SOLN: 2.0000 mg | INTRAMUSCULAR | Status: DC | PRN

## 2017-11-20 MED ORDER — METHYLPREDNISOLONE SODIUM SUCC 125 MG IJ SOLR
INTRAMUSCULAR | Status: AC
  Administered 2017-11-20: 40 mg
  Filled 2017-11-20: qty 2

## 2017-11-20 MED ORDER — ORAL CARE MOUTH RINSE: 15.0000 mL | Freq: Four times a day (QID) | OROMUCOSAL | Status: DC

## 2017-11-20 MED ORDER — CHLORHEXIDINE GLUCONATE 0.12% ORAL RINSE (MEDLINE KIT)
15.0000 mL | Freq: Two times a day (BID) | OROMUCOSAL | Status: DC
  Administered 2017-11-20 – 2017-11-22 (×4): 15 mL via OROMUCOSAL

## 2017-11-20 MED ORDER — FENTANYL 2500MCG IN NS 250ML (10MCG/ML) PREMIX INFUSION
25.0000 ug/h | INTRAVENOUS | Status: DC
  Administered 2017-11-21: 50 ug/h via INTRAVENOUS
  Filled 2017-11-20: qty 250

## 2017-11-20 MED ORDER — PROPOFOL 1000 MG/100ML IV EMUL
5.0000 ug/kg/min | Freq: Once | INTRAVENOUS | Status: AC
  Administered 2017-11-20: 10 ug/kg/min via INTRAVENOUS

## 2017-11-20 MED ORDER — SODIUM CHLORIDE 0.9 % IV BOLUS (SEPSIS)
500.0000 mL | Freq: Once | INTRAVENOUS | Status: AC
  Administered 2017-11-20: 500 mL via INTRAVENOUS

## 2017-11-20 MED ORDER — DEXMEDETOMIDINE HCL IN NACL 200 MCG/50ML IV SOLN
0.4000 ug/kg/h | INTRAVENOUS | Status: DC
  Administered 2017-11-20: 0.4 ug/kg/h via INTRAVENOUS
  Administered 2017-11-21: 0.6 ug/kg/h via INTRAVENOUS
  Administered 2017-11-21 (×2): 0.4 ug/kg/h via INTRAVENOUS
  Filled 2017-11-20 (×5): qty 50

## 2017-11-20 MED ORDER — PANTOPRAZOLE SODIUM 40 MG IV SOLR
40.0000 mg | Freq: Every day | INTRAVENOUS | Status: DC
  Administered 2017-11-20: 40 mg via INTRAVENOUS

## 2017-11-20 MED ORDER — HYDROCORTISONE NA SUCCINATE PF 100 MG IJ SOLR: 50.0000 mg | Freq: Two times a day (BID) | INTRAMUSCULAR | Status: DC

## 2017-11-20 MED ORDER — ETOMIDATE 2 MG/ML IV SOLN
INTRAVENOUS | Status: AC | PRN
  Administered 2017-11-20: 20 mg via INTRAVENOUS

## 2017-11-20 MED ORDER — SODIUM CHLORIDE 0.9 % IV SOLN
INTRAVENOUS | Status: AC | PRN
  Administered 2017-11-20: 1000 mL via INTRAVENOUS

## 2017-11-20 MED ORDER — SODIUM CHLORIDE 0.9 % IV SOLN: INTRAVENOUS | Status: DC

## 2017-11-20 MED ORDER — INSULIN ASPART 100 UNIT/ML ~~LOC~~ SOLN
0.0000 [IU] | SUBCUTANEOUS | Status: DC
Start: 2017-11-20 — End: 2017-11-21
  Administered 2017-11-20 (×2): 5 [IU] via SUBCUTANEOUS
  Administered 2017-11-21 (×2): 8 [IU] via SUBCUTANEOUS
  Administered 2017-11-21: 11 [IU] via SUBCUTANEOUS

## 2017-11-20 MED ORDER — VITAL HIGH PROTEIN PO LIQD
1000.0000 mL | ORAL | Status: DC
  Administered 2017-11-20: 1000 mL
  Filled 2017-11-20 (×2): qty 1000

## 2017-11-20 MED ORDER — SODIUM CHLORIDE 0.9 % IV SOLN
INTRAVENOUS | Status: DC
  Administered 2017-11-21 – 2017-11-28 (×2): via INTRAVENOUS

## 2017-11-20 MED ORDER — HEPARIN SODIUM (PORCINE) 5000 UNIT/ML IJ SOLN
5000.0000 [IU] | Freq: Three times a day (TID) | INTRAMUSCULAR | Status: DC
  Administered 2017-11-20 – 2017-11-24 (×13): 5000 [IU] via SUBCUTANEOUS
  Filled 2017-11-20 (×13): qty 1

## 2017-11-20 MED ORDER — ORAL CARE MOUTH RINSE
15.0000 mL | OROMUCOSAL | Status: DC
  Administered 2017-11-20 – 2017-11-22 (×24): 15 mL via OROMUCOSAL

## 2017-11-20 MED ORDER — MIDAZOLAM HCL 2 MG/2ML IJ SOLN
2.0000 mg | INTRAMUSCULAR | Status: DC | PRN
  Administered 2017-11-20 – 2017-11-21 (×3): 2 mg via INTRAVENOUS
  Filled 2017-11-20 (×5): qty 2

## 2017-11-20 MED ORDER — PROPOFOL 1000 MG/100ML IV EMUL
INTRAVENOUS | Status: AC
  Filled 2017-11-20: qty 100

## 2017-11-20 MED ORDER — FENTANYL CITRATE (PF) 100 MCG/2ML IJ SOLN: 50.0000 ug | Freq: Once | INTRAMUSCULAR | Status: DC

## 2017-11-20 MED ORDER — MAGNESIUM SULFATE 2 GM/50ML IV SOLN
2.0000 g | Freq: Once | INTRAVENOUS | Status: AC
  Administered 2017-11-20: 2 g via INTRAVENOUS
  Filled 2017-11-20: qty 50

## 2017-11-20 MED ORDER — FENTANYL BOLUS VIA INFUSION
50.0000 ug | INTRAVENOUS | Status: DC | PRN
  Filled 2017-11-20: qty 50

## 2017-11-20 MED ORDER — SUCCINYLCHOLINE CHLORIDE 20 MG/ML IJ SOLN
INTRAMUSCULAR | Status: AC | PRN
  Administered 2017-11-20: 200 mg via INTRAVENOUS

## 2017-11-20 MED ORDER — SODIUM CHLORIDE 0.9 % IV BOLUS (SEPSIS)
1000.0000 mL | Freq: Once | INTRAVENOUS | Status: AC
  Administered 2017-11-20: 1000 mL via INTRAVENOUS

## 2017-11-20 NOTE — Progress Notes (Signed)
Nursing Note: Advised by [employer] [phone] that based on the medication that she took (baclofen) her symptoms may mimic brain death for up to 72 hours.  Will advise other care givers and continue to assess neuro status frequently.

## 2017-11-20 NOTE — Progress Notes (Signed)
eLink Physician-Brief Progress Note Patient Name: [name] DOB: 06/20/1976 MRN: [id number]   Date of Service  11/20/2017  HPI/Events of Note  Intermittent agitation alternating with somnolence on the ventilator. Pt is not yet appropriate for extubation. Hypomagnesemia.  eICU Interventions  Precedex gtts until pt is appropriate for extubation. Mg So4 2gm iv x 1        Shauntelle Jamerson U Ulyess Muto 11/20/2017, 8:21 PM

## 2017-11-20 NOTE — Progress Notes (Addendum)
I was asked to look in on the patient by her nurse. The patient had been stablle hemodynamically and than started to drop her BP. It is presently 80/50. Her heart rate is ion the 80s, NSR. Her QT interval is 440-450 m sec. The patient has an 02 sat if 100% on 40% Fi02. I noticed her admit cortisol level was 10 which appears to be inapprpropriatelty low. I am going to give the patient a fluid bolus and than increase her maintenance fluids.] As far as her neuological status the patient does not move at all to pain. She is not breathing over the vent. She does not gag on suctioning. Poison control said to expect this for the next couple of days given the nature of her ingestion.

## 2017-11-20 NOTE — Progress Notes (Signed)
RT NOTE:  Pt transported to CT and back to ER without event 

## 2017-11-20 NOTE — Consult Note (Signed)
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 Wound type: intertrigenous Pressure Injury POA: NA Measurement: abdominal panus measure 20cm round including area on lower abd fold and pubic area. 10cm areas under bilateral breasts, left more damaged than right. Wound bed: reddened, moist Drainage (amount, consistency, odor) moist, no odor noted Periwound: intact Dressing procedure/placement/frequency: I have provided nurses with orders for use of house antimicrobial fabric, [employer] for MASD in areas noted above. We will not follow, but will remain available to this patient, to nursing, and the medical and/or surgical teams.  Please re-consult if we need to assist further.   Fara Olden, RN-C, WTA-C, [place] Wound Treatment Associate Ostomy Care Associate

## 2017-11-20 NOTE — Progress Notes (Signed)
RT NOTE:  Pt transported to 14M without event. Report given to [facility], RRT.

## 2017-11-20 NOTE — ED Notes (Signed)
Patient had a grand mal seizure while at triage room 2 , assisted to a stretcher and moved to trauma B.

## 2017-11-20 NOTE — Progress Notes (Signed)
Nursing Note: During bedside rounds with on coming shift and patient started to cough.  Followed by opening her eyes to and sticking out her tongue to command.  Patient with clear secretions orally but new bloody secretions from subglottic tube noted.  E-Link RN notified.  Dr Ursula Beath with CCM on camera would like for nursing to initiate at precedex infusion to assist with baclofen withdrawal as patient is expected to have issues ranging from unresponsiveness to agitation.  Husband notified.

## 2017-11-20 NOTE — Progress Notes (Signed)
Patient suddenly woke up and became agitated again around 2200 11/20/2017. Tube feed was going at the moment, and began to vomit. Tube feed was held and suctioned vomit immediatly.  Dr. Lucile Shutters notified about her second agitation episode. Dr. Lucile Shutters also suggested to cut down on her Tube feed to minimize vomiting.

## 2017-11-20 NOTE — Progress Notes (Signed)
EEG Completed; Results Pending  

## 2017-11-20 NOTE — H&P (Signed)
PULMONARY / CRITICAL CARE MEDICINE   Name: [name] MRN: [id number] DOB: 1976/09/04    ADMISSION DATE:  11/20/2017 CONSULTATION DATE:  3/13  REFERRING MD:  Dr. Christy Gentles  CHIEF COMPLAINT:  Encephalopathy/Overdose (intentional)  HISTORY OF PRESENT ILLNESS:   42 year old female with PMH as below, which is significant for cushing's disease, DM, depression, HTN, morbid obesity, and OSA. Within the past few days she has had a complicated family situation at home, which has caused a lot of distress. Otherwise her health has been in its usual state. In the evening hours of 3/12 she contacted her husband while he was working to inform him she was going to overdose. He went home and she had already taken approximately #15 10mg  Baclofen and 100mg  trazadone. She then went on to take "at least #30" 50mg  tramadol. She would not initially come to the ED. He called poison control and watched her. When she became encephalopathic and tremulous she agreed to come to ED. She suffered a generalized seizure in triage and was brought back to ED where she was emergently intubated. PCCM asked to admit.   PAST MEDICAL HISTORY :  She  has a past medical history of Anxiety, Complication of anesthesia, Cushing's disease ([place]), Depression, Diabetes ([place]), Hyperlipidemia, Hyperlipidemia, Hypertension, Morbid obesity ([place]), Osteoporosis (07/19/2015), Periprosthetic fracture 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[place]), R tibial plateau  (07/18/2015), Sleep apnea, Uncontrolled diabetes mellitus with diabetic neuropathy, with long-term current use of insulin ([place]) (07/16/2015), and Vitamin D deficiency (07/19/2015).  PAST SURGICAL HISTORY: She  has a past surgical history that includes Cesarean section (dec 1997/  06-03-2001/   01-01-2005); Partial knee arthroplasty (Right, 04/19/2014); Laparoscopic cholecystectomy (09-25-2005); Dilation and curettage of uterus (1995); Anterior talofibular ligament repair (Left, 11/15/2014); ORIF tibia  plateau (Right, 07/19/2015); Tubal ligation; and Esophagogastroduodenoscopy (egd) with propofol (N/A, 10/04/2016).  Allergies  Allergen Reactions  . Penicillins Hives    Has patient had a PCN reaction causing immediate rash, facial/tongue/throat swelling, SOB or lightheadedness with hypotension: Yes Has patient had a PCN reaction causing severe rash involving mucus membranes or skin necrosis: Yes Has patient had a PCN reaction that required hospitalization No Has patient had a PCN reaction occurring within the last 10 years: No If all of the above answers are "NO", then may proceed with Cephalosporin use.     No current facility-administered medications on file prior to encounter.    Current Outpatient Medications on File Prior to Encounter  Medication Sig  . baclofen (LIORESAL) 10 MG tablet Take 10 mg by mouth 3 (three) times daily.  . insulin NPH-regular Human (NOVOLIN 70/30) (70-30) 100 UNIT/ML injection Inject 28 Units into the skin at bedtime.  . traMADol (ULTRAM) 50 MG tablet Take 1 tablet (50 mg total) by mouth every 6 (six) hours as needed.  Marland Kitchen buPROPion (WELLBUTRIN XL) 150 MG 24 hr tablet Take 150 mg by mouth daily.  Marland Kitchen doxycycline (VIBRAMYCIN) 100 MG capsule Take 1 capsule (100 mg total) by mouth 2 (two) times daily.  . Exenatide ER (BYDUREON) 2 MG PEN Inject 2 mg into the skin every Sunday.   . fenofibrate (TRICOR) 48 MG tablet Take 48 mg by mouth daily.  Marland Kitchen gabapentin (NEURONTIN) 300 MG capsule Take 600 mg by mouth 2 (two) times daily.   Marland Kitchen gemfibrozil (LOPID) 600 MG tablet Take 600 mg by mouth 2 (two) times daily before a meal.  . HYDROcodone-acetaminophen (NORCO) 10-325 MG tablet Take 1 tablet by mouth every 6 (six) hours as needed.  Marland Kitchen  insulin detemir (LEVEMIR) 100 unit/ml SOLN Inject 60 Units into the skin every morning.   . insulin NPH-regular Human (NOVOLIN 70/30) (70-30) 100 UNIT/ML injection Inject 28 Units into the skin 2 (two) times daily with a meal. (Patient taking  differently: Inject 50 Units into the skin at bedtime. )  . levothyroxine (SYNTHROID, LEVOTHROID) 75 MCG tablet Take 1 tablet (75 mcg total) by mouth every morning.  . meclizine (ANTIVERT) 25 MG tablet Take 25 mg by mouth at bedtime.  . metoCLOPramide (REGLAN) 10 MG tablet TAKE ONE TABLET BY MOUTH AT BEDTIME  . naproxen (NAPROSYN) 500 MG tablet Take 1 tablet (500 mg total) by mouth 2 (two) times daily.  . ondansetron (ZOFRAN ODT) 4 MG disintegrating tablet Take 1 tablet (4 mg total) by mouth every 8 (eight) hours as needed for nausea or vomiting.  . pantoprazole (PROTONIX) 40 MG tablet TAKE ONE TABLET BY MOUTH TWICE A DAY BEFORE MEALS  . pravastatin (PRAVACHOL) 80 MG tablet Take 80 mg by mouth daily.  . quinapril (ACCUPRIL) 5 MG tablet Take 2.5 mg by mouth every morning.   . sucralfate (CARAFATE) 1 g tablet Take 1 tablet (1 g total) by mouth 4 (four) times daily -  with meals and at bedtime.  . traZODone (DESYREL) 50 MG tablet Take 50 mg by mouth at bedtime.  Marland Kitchen venlafaxine XR (EFFEXOR-XR) 37.5 MG 24 hr capsule Take 37.5-75 mg by mouth See admin instructions. Take 2 capsules= 75mg  in the morning and take 1 capsule = 37.5mg  at bedtime    FAMILY HISTORY:  Her is adopted.    SOCIAL HISTORY: She  reports that she has been smoking cigarettes.  She has smoked for the past 4.00 years. she has never used smokeless tobacco. She reports that she drinks alcohol. She reports that she does not use drugs.  REVIEW OF SYSTEMS:   unable  SUBJECTIVE:    VITAL SIGNS: BP (!) 100/58   Pulse (!) 117   Resp (!) 21   Ht 5\' 3"  (1.6 m)   Wt (!) 144.2 kg (318 lb)   SpO2 100%   BMI 56.33 kg/m   HEMODYNAMICS:    VENTILATOR SETTINGS: Vent Mode: PRVC FiO2 (%):  [100 %] 100 % Set Rate:  [20 bmp-24 bmp] 24 bmp Vt Set:  [400 mL-420 mL] 420 mL PEEP:  [5 cmH20] 5 cmH20 Plateau Pressure:  [22 cmH20] 22 cmH20  INTAKE / OUTPUT: No intake/output data recorded.  PHYSICAL EXAMINATION: General:  Morbidly  obese female on vent Neuro:  Sedated. GCS 3T HEENT:  [place]/AT, PERRL, no JVD Cardiovascular:  RRR, no MRG. No edema.  Lungs:  Clear bilateral breath sounds Abdomen:  Soft, non-distended. Hypoactive BS Musculoskeletal:  No acute deformity Skin:  Grossly intact  LABS:  BMET Recent Labs  Lab 11/20/17 0219  NA 138  K 4.0  CL 105  CO2 19*  BUN 21*  CREATININE 1.27*  GLUCOSE 224*    Electrolytes Recent Labs  Lab 11/20/17 0219  CALCIUM 9.7    CBC Recent Labs  Lab 11/20/17 0219  WBC 13.3*  HGB 14.2  HCT 42.5  PLT 402*    Coag's No results for input(s): APTT, INR in the last 168 hours.  Sepsis Markers No results for input(s): LATICACIDVEN, PROCALCITON, O2SATVEN in the last 168 hours.  ABG Recent Labs  Lab 11/20/17 0302  PHART 7.140*  PCO2ART 52.0*  PO2ART 229.0*    Liver Enzymes Recent Labs  Lab 11/20/17 0219  AST 33  ALT  26  ALKPHOS 111  BILITOT 0.8  ALBUMIN 3.9    Cardiac Enzymes No results for input(s): TROPONINI, PROBNP in the last 168 hours.  Glucose Recent Labs  Lab 11/20/17 0220  GLUCAP 199*    Imaging Dg Chest Port 1 View  Result Date: 11/20/2017 CLINICAL DATA:  Endotracheal tube and orogastric tube placement. EXAM: PORTABLE CHEST 1 VIEW COMPARISON:  Chest radiograph performed 12/12/2014 FINDINGS: The patient's endotracheal tube is seen ending 2 cm above the carina. An enteric tube is noted extending below the diaphragm. Vascular congestion is noted. A small left pleural effusion is seen. Bilateral airspace opacification is concerning for pulmonary edema. Superimposed pneumonia cannot be excluded. No pneumothorax is seen. The cardiomediastinal silhouette is normal in size. No acute osseous abnormalities are identified. IMPRESSION: 1. Endotracheal tube noted ending 2 cm above the carina. 2. Vascular congestion. Small left pleural effusion. Bilateral airspace opacification is concerning for pulmonary edema. Superimposed pneumonia cannot be  excluded. Electronically Signed   By: Garald Balding M.D.   On: 11/20/2017 03:00   Dg Abd Portable 1 View  Result Date: 11/20/2017 CLINICAL DATA:  Orogastric tube placement. EXAM: PORTABLE ABDOMEN - 1 VIEW COMPARISON:  CT of the abdomen and pelvis performed 01/16/2017 FINDINGS: The patient's enteric tube is noted ending overlying the body of the stomach, with the side port about the fundus of the stomach. The visualized bowel gas pattern is grossly unremarkable, with a small amount of air noted in the stomach. No free intra-abdominal air is seen, though evaluation for free air is limited on a single supine view. No acute osseous abnormalities are identified. IMPRESSION: Enteric tube noted ending overlying the body of the stomach. Electronically Signed   By: Garald Balding M.D.   On: 11/20/2017 03:01     STUDIES:  CT head 3/13 > unremarkable  CULTURES:   ANTIBIOTICS:   SIGNIFICANT EVENTS: 3/13 admit after intentional overdose of baclofen and tramadol.   LINES/TUBES: ETT 3/13 >  DISCUSSION: 42 year old morbidly obese female with complex medical history. Overdosed intentionally on   ASSESSMENT / PLAN:  PULMONARY A: Inability to protect airway in the setting of intentional overdose OSA  P:   Full vent support ABG CXR VAP bundle Will need to learn more about her CPAP use once extubated.   CARDIOVASCULAR A:  Hypotension H/o HTN, HLD  P:  Telemetry monitoring Aggressive volume resuscitation Holding home fenofibrate, gemfibrozil, quinapril, until able to take PO  RENAL A:   AKI  P:   Hydrate Follow BMP  GASTROINTESTINAL A:   No acute issues  P:   NPO PPI  HEMATOLOGIC A:   No acute issues  P:  Follow CBC subQ heparin  INFECTIOUS A:   No acute issues  P:     ENDOCRINE A:   DM Cushings Hypothyroid    P:   CBG monitoring and SSI q 4 hours Assess cortisol, TSH Continue synthroid at half home dose for IV  NEUROLOGIC A:   Acute toxic  encephalopathy - secondary to overdose of baclofen (at least 150mg  estimated) and tramadol (at least 1500mg  estimated). Co-ingestants negative Seizure: likely secondary to overdose Depression P:   RASS goal: 0 to -1 DC propofol Fentanyl infusion PRN versed WUA daily Hold off on antiepileptics for now EEG Holding home baclofen, tramadol, venlafaxine   FAMILY  - Updates: Husband updated via telephone 3/13 0400  - Inter-disciplinary family meet or Palliative Care meeting due by:  3/20   Georgann Housekeeper, AGACNP-BC [place]  Pulmonology/Critical Care Pager [phone] or [phone]  11/20/2017 3:57 AM

## 2017-11-20 NOTE — Progress Notes (Signed)
RT NOTE:  ISTAT ABG results reported to Dr. Christy Gentles

## 2017-11-20 NOTE — Code Documentation (Signed)
Pt on nonrebreather, postictal, snoring respirations

## 2017-11-20 NOTE — Progress Notes (Signed)
Initial Nutrition Assessment  DOCUMENTATION CODES:   Morbid obesity  INTERVENTION:   -If unable to extubate within 24-48 hours, recommend initiation of tube feeding  Tube Feeding:  Vital High Protein @ 40 ml/hr Pro-Stat 30 mL TID Provides 130 g of protein, 1260 kcals, 806 mL of water Meets 100% estimated calorie and protein needs  NUTRITION DIAGNOSIS:   Inadequate oral intake related to acute illness as evidenced by NPO status.  GOAL:   Provide needs based on ASPEN/SCCM guidelines  MONITOR:   Vent status, Labs, Weight trends, Skin  REASON FOR ASSESSMENT:   Ventilator    ASSESSMENT:   42 yo female admitted with acute toxic encephalopathy secondary to intentional OD on baclofan, trazadone and  tramadol, seizures. Pt intubate for airway protection. Pt with hx of Cushing's, DM, OSA  Pt currently on vent support OG tube in stomach  Unable to obtain diet and weight history on visit today. Per weight encounters, no significant weight changes recently.   Labs: CBGs 199-224 Meds: NS at 75 ml/hr  NUTRITION - FOCUSED PHYSICAL EXAM:    Most Recent Value  Orbital Region  No depletion  Upper Arm Region  No depletion  Thoracic and Lumbar Region  No depletion  Buccal Region  No depletion  Temple Region  No depletion  Clavicle Bone Region  No depletion  Clavicle and Acromion Bone Region  No depletion  Scapular Bone Region  No depletion  Dorsal Hand  No depletion  Patellar Region  No depletion  Anterior Thigh Region  No depletion  Posterior Calf Region  No depletion  Edema (RD Assessment)  Mild       Diet Order:  Diet NPO time specified  EDUCATION NEEDS:   Not appropriate for education at this time  Skin:  Skin Assessment: Skin Integrity Issues: Skin Integrity Issues:: Other (Comment) Other: MASD: abdomen, bilateral groin, under breasts  Last BM:  3/12  Height:   Ht Readings from Last 1 Encounters:  11/20/17 5\' 3"  (1.6 m)    Weight:   Wt Readings  from Last 1 Encounters:  11/20/17 (!) 308 lb 3.3 oz (139.8 kg)    Ideal Body Weight:  52.3 kg  BMI:  Body mass index is 54.6 kg/m.  Estimated Nutritional Needs:   Kcal:  1884-1660 kcals   Protein:  131 g   Fluid:  >/= 1.8 L   Kerman Passey MS, RD, LDN, CNSC [phone] Pager  [phone] Weekend/On-Call Pager

## 2017-11-20 NOTE — ED Provider Notes (Signed)
[facility] EMERGENCY DEPARTMENT Provider Note   CSN: [id number] Arrival date & time: 11/20/17  0155     History   Chief Complaint Chief Complaint  Patient presents with  . Drug Overdose    Baclofen/Tramadol  Level 5 caveat due to acuity of condition  HPI [name] is a 42 y.o. female.  The history is provided by the spouse. The history is limited by the condition of the patient.  Drug Overdose  This is a new problem. The problem occurs constantly. The problem has been rapidly worsening. Nothing aggravates the symptoms. Nothing relieves the symptoms. She has tried nothing for the symptoms.  Patient presents for overdose.  Husband reports that he she called him several hours ago while he was at work and told him that she was trying to harm himself.  When he arrived home she was taking large quantities of tramadol and baclofen, he is unsure of how many pills that she took but the bottles are empty.  She refused to go to the hospital.  He called poison center, who advised to continue to monitor and if any somnolence to take her to the hospital  she became increasingly somnolent was reporting dizziness, and on the way to the hospital she began having generalized tonic-clonic seizures.  She has no history of seizures.  This was a self harm attempt.  No other details are known at this time.  Past Medical History:  Diagnosis Date  . Anxiety   . Complication of anesthesia    Pt. states takes long time to wake up from it.   . Cushing's disease ([place])   . Depression   . Diabetes ([place])   . Hyperlipidemia   . Hyperlipidemia   . Hypertension   . Morbid obesity ([place])   . Osteoporosis 07/19/2015  . 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 Sleep apnea   . Uncontrolled diabetes mellitus with diabetic neuropathy, with long-term current use of insulin ([place]) 07/16/2015  . Vitamin D deficiency 07/19/2015    Patient  Active Problem List   Diagnosis Date Noted  . Spondylosis without myelopathy or radiculopathy, cervical region 10/29/2017  . Abdominal pain, epigastric   . LUQ abdominal pain   . Nausea and vomiting   . Gastroparesis   . Hypertriglyceridemia 07/22/2015  . Diabetes mellitus due to underlying condition, uncontrolled, with diabetic neuropathy, with long-term current use of insulin ([place])   . Vitamin D deficiency 07/19/2015  . 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 Osteoporosis 07/19/2015  . 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 Fracture, tibial plateau 07/16/2015  . Uncontrolled diabetes mellitus with diabetic neuropathy, with long-term current use of insulin ([place]) 07/16/2015  . Leukocytosis 07/16/2015  . Essential hypertension 07/16/2015  . Morbid obesity ([place]) 04/20/2014  . Hyperlipidemia 07/31/2012  . Cushing disease ([place]) 07/31/2012  . Hypothyroid 07/31/2012  . 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 ANTERIOR TALOFIBULAR LIGAMENT REPAIR Left 11/15/2014   Procedure: ANTERIOR TALOFIBULAR LIGAMENT REPAIR;  Surgeon: Jana Half, DPM;  Location: [facility];  Service: Podiatry;  Laterality: Left;  . CESAREAN SECTION  dec 1997/  06-03-2001/   01-01-2005   BILATERAL TUBAL LIGATION WITH LAST ONE  . DILATION AND CURETTAGE OF UTERUS  1995   WITH SUCTION  . ESOPHAGOGASTRODUODENOSCOPY (  EGD) WITH PROPOFOL N/A 10/04/2016   Procedure: ESOPHAGOGASTRODUODENOSCOPY (EGD) WITH PROPOFOL;  Surgeon: [name], MD;  Location: [facility];  Service: Endoscopy;  Laterality: N/A;  . LAPAROSCOPIC CHOLECYSTECTOMY  09-25-2005  . ORIF TIBIA PLATEAU Right 07/19/2015   Procedure: OPEN REDUCTION INTERNAL FIXATION (ORIF) RIGHT TIBIAL PLATEAU;  Surgeon: Altamese Wellman, MD;  Location: [facility];  Service:  Orthopedics;  Laterality: Right;  . PARTIAL KNEE ARTHROPLASTY Right 04/19/2014   Procedure: RIGHT UNI KNEE ARTHROPLASTY MEDIALLY ;  Surgeon: Mauri Pole, MD;  Location: [facility];  Service: Orthopedics;  Laterality: Right;  . TUBAL LIGATION      OB History    No data available       Home Medications    Prior to Admission medications   Medication Sig Start Date End Date Taking? Authorizing Provider  baclofen (LIORESAL) 10 MG tablet Take 10 mg by mouth 3 (three) times daily.   Yes [provider]  insulin NPH-regular Human (NOVOLIN 70/30) (70-30) 100 UNIT/ML injection Inject 28 Units into the skin at bedtime. 07/21/15  Yes [provider]  traMADol (ULTRAM) 50 MG tablet Take 1 tablet (50 mg total) by mouth every 6 (six) hours as needed. 10/27/17  Yes Neese, Bethany, NP  buPROPion (WELLBUTRIN XL) 150 MG 24 hr tablet Take 150 mg by mouth daily.    [provider]  doxycycline (VIBRAMYCIN) 100 MG capsule Take 1 capsule (100 mg total) by mouth 2 (two) times daily. 10/27/17   Ashley Murrain, NP  Exenatide ER (BYDUREON) 2 MG PEN Inject 2 mg into the skin every Sunday.     [provider]  fenofibrate (TRICOR) 48 MG tablet Take 48 mg by mouth daily.    [provider]  gabapentin (NEURONTIN) 300 MG capsule Take 600 mg by mouth 2 (two) times daily.     [provider]  gemfibrozil (LOPID) 600 MG tablet Take 600 mg by mouth 2 (two) times daily before a meal.    [provider]  HYDROcodone-acetaminophen (NORCO) 10-325 MG tablet Take 1 tablet by mouth every 6 (six) hours as needed.    [provider]  insulin detemir (LEVEMIR) 100 unit/ml SOLN Inject 60 Units into the skin every morning.     [provider]  insulin NPH-regular Human (NOVOLIN 70/30) (70-30) 100 UNIT/ML injection Inject 28 Units into the skin 2 (two) times daily with a meal. Patient taking differently: Inject 50 Units into the skin at bedtime.  07/21/15    Delfina Redwood, MD  levothyroxine (SYNTHROID, LEVOTHROID) 75 MCG tablet Take 1 tablet (75 mcg total) by mouth every morning. 07/21/15   Delfina Redwood, MD  meclizine (ANTIVERT) 25 MG tablet Take 25 mg by mouth at bedtime.    [provider]  metoCLOPramide (REGLAN) 10 MG tablet TAKE ONE TABLET BY MOUTH AT BEDTIME 04/01/17   [name], MD  naproxen (NAPROSYN) 500 MG tablet Take 1 tablet (500 mg total) by mouth 2 (two) times daily. 10/31/16   Tanna Furry, MD  ondansetron (ZOFRAN ODT) 4 MG disintegrating tablet Take 1 tablet (4 mg total) by mouth every 8 (eight) hours as needed for nausea or vomiting. 08/25/16   Theodosia Quay, MD  pantoprazole (PROTONIX) 40 MG tablet TAKE ONE TABLET BY MOUTH TWICE A DAY BEFORE MEALS 04/01/17   [name], MD  pravastatin (PRAVACHOL) 80 MG tablet Take 80 mg by mouth daily.    [provider]  quinapril (ACCUPRIL) 5 MG  tablet Take 2.5 mg by mouth every morning.     [provider]  sucralfate (CARAFATE) 1 g tablet Take 1 tablet (1 g total) by mouth 4 (four) times daily -  with meals and at bedtime. 08/25/16   Theodosia Quay, MD  traZODone (DESYREL) 50 MG tablet Take 50 mg by mouth at bedtime.    [provider]  venlafaxine XR (EFFEXOR-XR) 37.5 MG 24 hr capsule Take 37.5-75 mg by mouth See admin instructions. Take 2 capsules= 75mg  in the morning and take 1 capsule = 37.5mg  at bedtime    [provider]    Family History Family History  Adopted: Yes  Problem Relation Age of Onset  . Autism Son   . ADD / ADHD Son   . Apraxia Son     Social History Social History   Tobacco Use  . Smoking status: Current Every Day Smoker    Years: 4.00    Types: Cigarettes  . Smokeless tobacco: Never Used  Substance Use Topics  . Alcohol use: Yes    Comment: RARE  . Drug use: No     Allergies   Penicillins   Review of Systems Review of Systems  Unable to perform ROS: Acuity of condition      Physical Exam Updated Vital Signs BP (!) 100/58   Pulse (!) 117   Resp (!) 21   Ht 1.6 m (5\' 3" )   Wt (!) 144.2 kg (318 lb)   SpO2 100%   BMI 56.33 kg/m   Physical Exam CONSTITUTIONAL: Mildly obese, somnolent HEAD: Normocephalic/atraumatic EYES: EOMI/PERRL ENMT: Mucous membranes moist NECK: supple no meningeal signs SPINE/BACK: No bruising/crepitance/stepoffs noted to spine CV: S1/S2 noted, no murmurs/rubs/gallops noted, tachycardic LUNGS: Coarse breath sounds bilaterally ABDOMEN: soft, obese NEURO: Pt is unresponsive, GCS is 3 EXTREMITIES: pulses normal/equal, full ROM SKIN: warm, color normal PSYCH: unAble to assess  ED Treatments / Results  Labs (all labs ordered are listed, but only abnormal results are displayed) Labs Reviewed  COMPREHENSIVE METABOLIC PANEL - Abnormal; Notable for the following components:      Result Value   CO2 19 (*)    Glucose, Bld 224 (*)    BUN 21 (*)    Creatinine, Ser 1.27 (*)    GFR calc non Af Amer 51 (*)    GFR calc Af Amer 59 (*)    All other components within normal limits  CBC - Abnormal; Notable for the following components:   WBC 13.3 (*)    Platelets 402 (*)    All other components within normal limits  ACETAMINOPHEN LEVEL - Abnormal; Notable for the following components:   Acetaminophen (Tylenol), Serum <10 (*)    All other components within normal limits  CBG MONITORING, ED - Abnormal; Notable for the following components:   Glucose-Capillary 199 (*)    All other components within normal limits  I-STAT ARTERIAL BLOOD GAS, ED - Abnormal; Notable for the following components:   pH, Arterial 7.140 (*)    pCO2 arterial 52.0 (*)    pO2, Arterial 229.0 (*)    Bicarbonate 17.7 (*)    TCO2 19 (*)    Acid-base deficit 11.0 (*)    All other components within normal limits  ETHANOL  SALICYLATE LEVEL  RAPID URINE DRUG SCREEN, HOSP PERFORMED  I-STAT BETA HCG BLOOD, ED ([facility], [facility], [facility] ONLY)  I-STAT BETA HCG BLOOD, ED ([facility], [facility],  [facility] ONLY)    EKG  EKG Interpretation  Date/Time:  Wednesday November 20 2017 02:00:47 EDT Ventricular Rate:  124 PR Interval:  154 QRS Duration: 86 QT Interval:  308 QTC Calculation: 442 R Axis:   23 Text Interpretation:  Sinus tachycardia Otherwise normal ECG Confirmed by Ripley Fraise [phone]) on 11/20/2017 2:28:18 AM       Radiology Ct Head Wo Contrast  Result Date: 11/20/2017 CLINICAL DATA:  42 year old female with altered mental status. EXAM: CT HEAD WITHOUT CONTRAST TECHNIQUE: Contiguous axial images were obtained from the base of the skull through the vertex without intravenous contrast. COMPARISON:  None FINDINGS: Brain: The ventricles and sulci appropriate size for patient's age. The gray-white matter discrimination is preserved. There is no acute intracranial hemorrhage. No mass effect or midline shift. No extra-axial fluid collection. Vascular: No hyperdense vessel or unexpected calcification. Skull: Normal. Negative for fracture or focal lesion. Sinuses/Orbits: No acute finding. Other: Partially visualized endotracheal and enteric tubes. IMPRESSION: Unremarkable noncontrast CT of the brain. Electronically Signed   By: Anner Crete M.D.   On: 11/20/2017 03:23   Dg Chest Port 1 View  Result Date: 11/20/2017 CLINICAL DATA:  Endotracheal tube and orogastric tube placement. EXAM: PORTABLE CHEST 1 VIEW COMPARISON:  Chest radiograph performed 12/12/2014 FINDINGS: The patient's endotracheal tube is seen ending 2 cm above the carina. An enteric tube is noted extending below the diaphragm. Vascular congestion is noted. A small left pleural effusion is seen. Bilateral airspace opacification is concerning for pulmonary edema. Superimposed pneumonia cannot be excluded. No pneumothorax is seen. The cardiomediastinal silhouette is normal in size. No acute osseous abnormalities are identified. IMPRESSION: 1. Endotracheal tube noted ending 2 cm above the carina. 2. Vascular congestion. Small  left pleural effusion. Bilateral airspace opacification is concerning for pulmonary edema. Superimposed pneumonia cannot be excluded. Electronically Signed   By: Garald Balding M.D.   On: 11/20/2017 03:00   Dg Abd Portable 1 View  Result Date: 11/20/2017 CLINICAL DATA:  Orogastric tube placement. EXAM: PORTABLE ABDOMEN - 1 VIEW COMPARISON:  CT of the abdomen and pelvis performed 01/16/2017 FINDINGS: The patient's enteric tube is noted ending overlying the body of the stomach, with the side port about the fundus of the stomach. The visualized bowel gas pattern is grossly unremarkable, with a small amount of air noted in the stomach. No free intra-abdominal air is seen, though evaluation for free air is limited on a single supine view. No acute osseous abnormalities are identified. IMPRESSION: Enteric tube noted ending overlying the body of the stomach. Electronically Signed   By: Garald Balding M.D.   On: 11/20/2017 03:01    Procedures Procedure Name: Intubation Date/Time: 11/20/2017 3:00 AM Performed by: Ripley Fraise, MD Pre-anesthesia Checklist: Patient identified Preoxygenation: Pre-oxygenation with 100% oxygen Induction Type: Rapid sequence Ventilation: Mask ventilation with difficulty Laryngoscope Size: Glidescope and 4 Grade View: Grade II Tube size: 7.5 mm Number of attempts: 1 Airway Equipment and Method: Video-laryngoscopy Placement Confirmation: ETT inserted through vocal cords under direct vision,  CO2 detector and Breath sounds checked- equal and bilateral Secured at: 22 cm Tube secured with: ETT holder Difficulty Due To: Difficulty was anticipated and Difficult Airway- due to reduced neck mobility     CRITICAL CARE Performed by: Sharyon Cable Total critical care time: 33 minutes Critical care time was exclusive of separately billable procedures and treating other patients. Critical care was necessary to treat or prevent imminent or life-threatening  deterioration. Critical care was time spent personally by me on the following activities: development of treatment plan with patient and/or surrogate  as well as nursing, discussions with consultants, evaluation of patient's response to treatment, examination of patient, obtaining history from patient or surrogate, ordering and performing treatments and interventions, ordering and review of laboratory studies, ordering and review of radiographic studies, pulse oximetry and re-evaluation of patient's condition. Patient with polysubstance overdose requiring intubation and admission to the ICU  Medications Ordered in ED Medications  fentaNYL (SUBLIMAZE) injection 50 mcg (not administered)  fentaNYL 2551mcg in NS 256mL (25mcg/ml) infusion-PREMIX (not administered)  fentaNYL (SUBLIMAZE) bolus via infusion 50 mcg (not administered)  midazolam (VERSED) injection 2 mg (not administered)  midazolam (VERSED) injection 2 mg (not administered)  0.9 %  sodium chloride infusion (1,000 mLs Intravenous New Bag/Given 11/20/17 0225)  etomidate (AMIDATE) injection (20 mg Intravenous Given 11/20/17 0230)  succinylcholine (ANECTINE) injection (200 mg Intravenous Given 11/20/17 0231)  propofol (DIPRIVAN) 1000 MG/100ML infusion (10 mcg/kg/min  144.2 kg Intravenous New Bag/Given 11/20/17 0238)     Initial Impression / Assessment and Plan / ED Course  I have reviewed the triage vital signs and the nursing notes.  Pertinent labs & imaging results that were available during my care of the patient were reviewed by me and considered in my medical decision making (see chart for details).     Seen on arrival, patient was actively seizing.  The seizure terminated spontaneously.  Husband told me that she took a large quantity of tramadol baclofen, which is likely causing her seizures.  Due to seizures and somnolence, I elected to intubate patient. Patient now intubated.  I discussed the case with critical care for admission.   She is acidotic, her vent settings have been changed I discussed the case with poison center, as they were already aware of the patient.  They have been updated.  4:01 AM Seen by critical-care, will be admitted to the ICU  Final Clinical Impressions(s) / ED Diagnoses   Final diagnoses:  Intentional drug overdose, initial encounter [facility])  Acute respiratory failure with hypercapnia [facility])    ED Discharge Orders    None       Ripley Fraise, MD 11/20/17 0401

## 2017-11-20 NOTE — Procedures (Signed)
ELECTROENCEPHALOGRAM REPORT  Date of Study: 11/20/2017  Patient's Name: [name] MRN: [id number] Date of Birth: [birth year]  Referring Provider: Georgann Housekeeper, NP  Clinical History: This is a 42 year old woman with witnessed seizure after OD.  Medications: No sedating medications listed  Technical Summary: A multichannel digital EEG recording measured by the international 10-20 system with electrodes applied with paste and impedances below 5000 ohms performed as portable with EKG monitoring in an intubated and unresponsive patient.  Hyperventilation and photic stimulation were not performed.  The digital EEG was referentially recorded, reformatted, and digitally filtered in a variety of bipolar and referential montages for optimal display.   Description: The patient is intubated and unresponsive during the recording. There is no clear posterior dominant rhythm seen. The background consists of a large amount of diffuse 4-5 Hz theta and 2-3 Hz delta slowing, at times with triphasic-like appearance. There are occasional 1-second periods of diffuse suppression. Normal sleep architecture was not seen. No reactivity noted with noxious stimulation. Hyperventilation and photic stimulation were not performed. There were no epileptiform discharges or electrographic seizures seen.    EKG lead was unremarkable.  Impression: This EEG is abnormal due to moderate diffuse background slowing.  Clinical Correlation of the above findings indicates diffuse cerebral dysfunction that is non-specific in etiology and can be seen with hypoxic/ischemic injury, toxic/metabolic encephalopathies, or medication effect.  The absence of epileptiform discharges does not rule out a clinical diagnosis of epilepsy.  Clinical correlation is advised.   Ellouise Newer, M.D.

## 2017-11-20 NOTE — ED Notes (Addendum)
Pt began seizing in triage, grand mal

## 2017-11-20 NOTE — ED Triage Notes (Signed)
Spouse reported pt. intentionally overdosed ( unknown amount) on her Baclofen and Tramadol this evening , somnolent at arrival , respirations unlabored .

## 2017-11-20 NOTE — Care Management Note (Addendum)
Case Management Note  Patient Details  Name: [name] MRN: [id number] Date of Birth: 1975-11-02  Subjective/Objective:  From home with spouse and children, pta indep, she presents with acute toxice encephalopathy secondary to intentional overdose of baclofen (at least 150mg  estimated) and tramadol (at least 1500mg  estimated).  Also with seizure secondary to overdose. Conts on vent, per RN, spouse states poison control states due to the pills she took , she will most likely be in comatose state for 72 hrs.   3/15 1519 Tomi Bamberger RN, BSN - plan for extubation from vent today.                      Action/Plan: NCM will follow for transition of care needs.   Expected Discharge Date:                  Expected Discharge Plan:     In-House Referral:     Discharge planning Services  CM Consult  Post Acute Care Choice:    Choice offered to:     DME Arranged:    DME Agency:     HH Arranged:    HH Agency:     Status of Service:  In process, will continue to follow  If discussed at Long Length of Stay Meetings, dates discussed:    Additional Comments:  Zenon Mayo, RN 11/20/2017, 4:15 PM

## 2017-11-20 NOTE — Progress Notes (Signed)
PULMONARY / CRITICAL CARE MEDICINE   Name: [name] MRN: [id number] DOB: 1976/08/17    ADMISSION DATE:  11/20/2017 CONSULTATION DATE:  3/13  REFERRING MD:  Dr. Christy Gentles  CHIEF COMPLAINT:  Encephalopathy/Overdose (intentional)  HISTORY OF PRESENT ILLNESS:   42 year old female with PMH as below, which is significant for cushing's disease, DM, depression, HTN, morbid obesity, and OSA. Within the past few days she has had a complicated family situation at home, which has caused a lot of distress. Otherwise her health has been in its usual state. In the evening hours of 3/12 she contacted her husband while he was working to inform him she was going to overdose. He went home and she had already taken approximately #15 10mg  Baclofen and 100mg  trazadone. She then went on to take "at least #30" 50mg  tramadol. She would not initially come to the ED. He called poison control and watched her. When she became encephalopathic and tremulous she agreed to come to ED. She suffered a generalized seizure in triage and was brought back to ED where she was emergently intubated. PCCM asked to admit.     SUBJECTIVE:  Obese female intubated secondary to overdose assess for possible extubation.  VITAL SIGNS: BP 134/89   Pulse 93   Temp 98.1 F (36.7 C) (Oral)   Resp (!) 23   Ht 5\' 3"  (1.6 m)   Wt (!) 139.8 kg (308 lb 3.3 oz)   SpO2 100%   BMI 54.60 kg/m   HEMODYNAMICS:    VENTILATOR SETTINGS: Vent Mode: PRVC FiO2 (%):  [40 %-100 %] 40 % Set Rate:  [20 bmp-24 bmp] 24 bmp Vt Set:  [400 mL-420 mL] 420 mL PEEP:  [5 cmH20] 5 cmH20 Plateau Pressure:  [22 cmH20] 22 cmH20  INTAKE / OUTPUT: I/O last 3 completed shifts: In: 2101.4 [I.V.:1101.4; IV Piggyback:1000] Out: 100 [Urine:100]  PHYSICAL EXAMINATION: General: Morbidly obese at 318 pounds HEENT: Endotracheal tube connected to ventilator PSY: Sedated  neuro: Moves all extremities with noxious stimuli CV: s1s2 rrr, no m/r/g PULM:  even/non-labored, lungs bilaterally decreased at the bases OJ:JKKX, non-tender, bsx4 active obese Extremities: warm/dry, 1+ edema  Skin: no rashes or lesions  LABS:  BMET Recent Labs  Lab 11/20/17 0219  NA 138  K 4.0  CL 105  CO2 19*  BUN 21*  CREATININE 1.27*  GLUCOSE 224*    Electrolytes Recent Labs  Lab 11/20/17 0219  CALCIUM 9.7    CBC Recent Labs  Lab 11/20/17 0219  WBC 13.3*  HGB 14.2  HCT 42.5  PLT 402*    Coag's No results for input(s): APTT, INR in the last 168 hours.  Sepsis Markers No results for input(s): LATICACIDVEN, PROCALCITON, O2SATVEN in the last 168 hours.  ABG Recent Labs  Lab 11/20/17 0302 11/20/17 0610  PHART 7.140* 7.392  PCO2ART 52.0* 34.7  PO2ART 229.0* 115*    Liver Enzymes Recent Labs  Lab 11/20/17 0219  AST 33  ALT 26  ALKPHOS 111  BILITOT 0.8  ALBUMIN 3.9    Cardiac Enzymes No results for input(s): TROPONINI, PROBNP in the last 168 hours.  Glucose Recent Labs  Lab 11/20/17 0220 11/20/17 0754  GLUCAP 199* 224*    Imaging Ct Head Wo Contrast  Result Date: 11/20/2017 CLINICAL DATA:  42 year old female with altered mental status. EXAM: CT HEAD WITHOUT CONTRAST TECHNIQUE: Contiguous axial images were obtained from the base of the skull through the vertex without intravenous contrast. COMPARISON:  None FINDINGS: Brain: The  ventricles and sulci appropriate size for patient's age. The gray-white matter discrimination is preserved. There is no acute intracranial hemorrhage. No mass effect or midline shift. No extra-axial fluid collection. Vascular: No hyperdense vessel or unexpected calcification. Skull: Normal. Negative for fracture or focal lesion. Sinuses/Orbits: No acute finding. Other: Partially visualized endotracheal and enteric tubes. IMPRESSION: Unremarkable noncontrast CT of the brain. Electronically Signed   By: Anner Crete M.D.   On: 11/20/2017 03:23   Dg Chest Port 1 View  Result Date:  11/20/2017 CLINICAL DATA:  Endotracheal tube and orogastric tube placement. EXAM: PORTABLE CHEST 1 VIEW COMPARISON:  Chest radiograph performed 12/12/2014 FINDINGS: The patient's endotracheal tube is seen ending 2 cm above the carina. An enteric tube is noted extending below the diaphragm. Vascular congestion is noted. A small left pleural effusion is seen. Bilateral airspace opacification is concerning for pulmonary edema. Superimposed pneumonia cannot be excluded. No pneumothorax is seen. The cardiomediastinal silhouette is normal in size. No acute osseous abnormalities are identified. IMPRESSION: 1. Endotracheal tube noted ending 2 cm above the carina. 2. Vascular congestion. Small left pleural effusion. Bilateral airspace opacification is concerning for pulmonary edema. Superimposed pneumonia cannot be excluded. Electronically Signed   By: Garald Balding M.D.   On: 11/20/2017 03:00   Dg Abd Portable 1 View  Result Date: 11/20/2017 CLINICAL DATA:  Orogastric tube placement. EXAM: PORTABLE ABDOMEN - 1 VIEW COMPARISON:  CT of the abdomen and pelvis performed 01/16/2017 FINDINGS: The patient's enteric tube is noted ending overlying the body of the stomach, with the side port about the fundus of the stomach. The visualized bowel gas pattern is grossly unremarkable, with a small amount of air noted in the stomach. No free intra-abdominal air is seen, though evaluation for free air is limited on a single supine view. No acute osseous abnormalities are identified. IMPRESSION: Enteric tube noted ending overlying the body of the stomach. Electronically Signed   By: Garald Balding M.D.   On: 11/20/2017 03:01     STUDIES:  CT head 3/13 > unremarkable  CULTURES:   ANTIBIOTICS:   SIGNIFICANT EVENTS: 3/13 admit after intentional overdose of baclofen and tramadol.   LINES/TUBES: ETT 3/13 >  DISCUSSION: 42 year old morbidly obese female with complex medical history. Overdosed intentionally on    ASSESSMENT / PLAN:  PULMONARY A: Inability to protect airway in the setting of intentional overdose OSA  P:   Full vent support ABG CXR VAP bundle Will need to learn more about her CPAP use once extubated.  After family bring in mass from home Attempt workup assessment and extubation 11/20/2017  CARDIOVASCULAR A:  Hypotension H/o HTN, HLD  P:  Telemetry monitoring Aggressive volume resuscitation Holding home fenofibrate, gemfibrozil, quinapril, until able to take PO  RENAL Lab Results  Component Value Date   CREATININE 1.27 (H) 11/20/2017   CREATININE 0.70 01/16/2017   CREATININE 0.75 10/31/2016   Recent Labs  Lab 11/20/17 0219  K 4.0    A:   AKI  P:   Hydrate Follow BMP  GASTROINTESTINAL A:   No acute issues  P:   NPO PPI  HEMATOLOGIC Recent Labs    11/20/17 0219  HGB 14.2    A:   No acute issues  P:  Follow CBC subQ heparin  INFECTIOUS A:   No acute issues  P:   Monitor temperature and white blood count  ENDOCRINE CBG (last 3)  Recent Labs    11/20/17 0220 11/20/17 0754  GLUCAP 199*  224*    A:   DM Cushings Hypothyroid    P:   CBG monitoring and SSI q 4 hours Assess cortisol, TSH Continue synthroid at half home dose for IV  NEUROLOGIC A:   Acute toxic encephalopathy - secondary to overdose of baclofen (at least 150mg  estimated) and tramadol (at least 1500mg  estimated). Co-ingestants negative Seizure: likely secondary to overdose Depression P:   RASS goal: 0 to -1 DC propofol Fentanyl infusion DC 11/20/2017 PRN versed WUA daily Hold off on antiepileptics for now EEG Holding home baclofen, tramadol, venlafaxine   FAMILY  - Updates: 11/20/2017 family updated at bedside  - Inter-disciplinary family meet or Palliative Care meeting due by:  3/20   App cct 30 min   Richardson Landry Caydan Mctavish ACNP Maryanna Shape PCCM Pager [phone] till 1 pm If no answer page [phone]- [phone] 11/20/2017, 9:49 AM

## 2017-11-20 NOTE — Progress Notes (Signed)
Nurse helped me understand some of dynamics.  Not sure that patient was aware of anything, but had prayer with her and pastoral presence,  As prayer ended, mother of patient Fraser Din) came in.  I shared some time with her mother who expressed son in law has taken off week to deal with this, special needs son will be greatly affected by this and another child also.  This evidently is a cry for help.  Lots of health problems-joint problems requiring replacement, likely also unknown struggles she maybe didn't know how to express.  Hopefully the psychological testing will show what she needs and she can get the help she needs.  Inpatient would be helpful because otherwise she goes right back home to the burdens(unknown) she is carrying with the responsibilities. She may need relief from all of this and focus on herself.  Had prayer with mother-Support in place for patient. Conard Novak, Chaplain   11/20/17 1300  Clinical Encounter Type  Visited With Patient;Family  Visit Type Initial;Spiritual support;Social support  Referral From Nurse  Consult/Referral To Chaplain  Spiritual Encounters  Spiritual Needs Prayer;Emotional  Stress Factors  Patient Stress Factors Other (Comment) (patient took bunch of pills)  Family Stress Factors Health changes;Major life changes;Other (Comment) (suicude attempt)

## 2017-11-21 ENCOUNTER — Encounter (HOSPITAL_COMMUNITY): Payer: Self-pay

## 2017-11-21 ENCOUNTER — Inpatient Hospital Stay (HOSPITAL_COMMUNITY): Payer: Medicaid Other

## 2017-11-21 ENCOUNTER — Other Ambulatory Visit: Payer: Self-pay

## 2017-11-21 LAB — CBC WITH DIFFERENTIAL/PLATELET
BASOS ABS: 0 10*3/uL (ref 0.0–0.1)
BASOS PCT: 0 %
Eosinophils Absolute: 0 10*3/uL (ref 0.0–0.7)
Eosinophils Relative: 0 %
HEMATOCRIT: 41.1 % (ref 36.0–46.0)
Hemoglobin: 13.5 g/dL (ref 12.0–15.0)
Lymphocytes Relative: 4 %
Lymphs Abs: 0.4 10*3/uL — ABNORMAL LOW (ref 0.7–4.0)
MCH: 29.5 pg (ref 26.0–34.0)
MCHC: 32.8 g/dL (ref 30.0–36.0)
MCV: 89.7 fL (ref 78.0–100.0)
MONO ABS: 0.1 10*3/uL (ref 0.1–1.0)
Monocytes Relative: 2 %
NEUTROS ABS: 9.1 10*3/uL — AB (ref 1.7–7.7)
NEUTROS PCT: 94 %
Platelets: 322 10*3/uL (ref 150–400)
RBC: 4.58 MIL/uL (ref 3.87–5.11)
RDW: 13 % (ref 11.5–15.5)
WBC: 9.7 10*3/uL (ref 4.0–10.5)

## 2017-11-21 LAB — PHOSPHORUS
PHOSPHORUS: 2.4 mg/dL — AB (ref 2.5–4.6)
Phosphorus: 2.7 mg/dL (ref 2.5–4.6)

## 2017-11-21 LAB — GLUCOSE, CAPILLARY
Glucose-Capillary: 205 mg/dL — ABNORMAL HIGH (ref 65–99)
Glucose-Capillary: 222 mg/dL — ABNORMAL HIGH (ref 65–99)
Glucose-Capillary: 283 mg/dL — ABNORMAL HIGH (ref 65–99)
Glucose-Capillary: 286 mg/dL — ABNORMAL HIGH (ref 65–99)
Glucose-Capillary: 306 mg/dL — ABNORMAL HIGH (ref 65–99)
Glucose-Capillary: 337 mg/dL — ABNORMAL HIGH (ref 65–99)

## 2017-11-21 LAB — BASIC METABOLIC PANEL
ANION GAP: 11 (ref 5–15)
BUN: 24 mg/dL — ABNORMAL HIGH (ref 6–20)
CALCIUM: 9 mg/dL (ref 8.9–10.3)
CO2: 20 mmol/L — AB (ref 22–32)
Chloride: 106 mmol/L (ref 101–111)
Creatinine, Ser: 1.23 mg/dL — ABNORMAL HIGH (ref 0.44–1.00)
GFR, EST NON AFRICAN AMERICAN: 53 mL/min — AB (ref >60)
Glucose, Bld: 326 mg/dL — ABNORMAL HIGH (ref 65–99)
Potassium: 5.3 mmol/L — ABNORMAL HIGH (ref 3.5–5.1)
Sodium: 137 mmol/L (ref 135–145)

## 2017-11-21 LAB — MAGNESIUM
MAGNESIUM: 1.9 mg/dL (ref 1.7–2.4)
Magnesium: 2.1 mg/dL (ref 1.7–2.4)

## 2017-11-21 MED ORDER — ACETAMINOPHEN 160 MG/5ML PO SOLN
650.0000 mg | ORAL | Status: DC | PRN
Start: 2017-11-21 — End: 2017-11-23
  Administered 2017-11-21 – 2017-11-22 (×2): 650 mg
  Filled 2017-11-21 (×2): qty 20.3

## 2017-11-21 MED ORDER — PANTOPRAZOLE SODIUM 40 MG PO PACK
40.0000 mg | PACK | Freq: Every day | ORAL | Status: DC
  Administered 2017-11-21 – 2017-11-23 (×2): 40 mg
  Filled 2017-11-21 (×4): qty 20

## 2017-11-21 MED ORDER — LEVOTHYROXINE SODIUM 75 MCG PO TABS
75.0000 ug | ORAL_TABLET | Freq: Every day | ORAL | Status: DC
  Administered 2017-11-22 – 2017-11-27 (×6): 75 ug via ORAL
  Filled 2017-11-21 (×6): qty 1

## 2017-11-21 MED ORDER — VITAL HIGH PROTEIN PO LIQD
1000.0000 mL | ORAL | Status: DC
  Filled 2017-11-21 (×3): qty 1000

## 2017-11-21 MED ORDER — METHYLPREDNISOLONE SODIUM SUCC 40 MG IJ SOLR
40.0000 mg | INTRAMUSCULAR | Status: DC
  Administered 2017-11-22: 40 mg via INTRAVENOUS
  Filled 2017-11-21: qty 1

## 2017-11-21 MED ORDER — INSULIN ASPART 100 UNIT/ML ~~LOC~~ SOLN
3.0000 [IU] | SUBCUTANEOUS | Status: DC
  Administered 2017-11-21 – 2017-11-22 (×7): 9 [IU] via SUBCUTANEOUS

## 2017-11-21 MED ORDER — INSULIN GLARGINE 100 UNIT/ML ~~LOC~~ SOLN
10.0000 [IU] | Freq: Every day | SUBCUTANEOUS | Status: DC
  Administered 2017-11-21: 10 [IU] via SUBCUTANEOUS
  Filled 2017-11-21: qty 0.1

## 2017-11-21 MED ORDER — HYDRALAZINE HCL 20 MG/ML IJ SOLN
10.0000 mg | INTRAMUSCULAR | Status: DC | PRN
  Administered 2017-11-21 – 2017-11-22 (×3): 10 mg via INTRAVENOUS
  Filled 2017-11-21 (×3): qty 1

## 2017-11-21 NOTE — Progress Notes (Signed)
PULMONARY / CRITICAL CARE MEDICINE   Name: [name] MRN: [id number] DOB: Oct 10, 1975    ADMISSION DATE:  11/20/2017 CONSULTATION DATE:  3/13  REFERRING MD:  Dr. Christy Gentles  CHIEF COMPLAINT:  Encephalopathy/Overdose (intentional)  HISTORY OF PRESENT ILLNESS:   42 year old female with PMH as below, which is significant for cushing's disease, DM, depression, HTN, morbid obesity, and OSA. Within the past few days she has had a complicated family situation at home, which has caused a lot of distress. Otherwise her health has been in its usual state. In the evening hours of 3/12 she contacted her husband while he was working to inform him she was going to overdose. He went home and she had already taken approximately #15 10mg  Baclofen and 100mg  trazadone. She then went on to take "at least #30" 50mg  tramadol. She would not initially come to the ED. He called poison control and watched her. When she became encephalopathic and tremulous she agreed to come to ED. She suffered a generalized seizure in triage and was brought back to ED where she was emergently intubated. PCCM asked to admit.     SUBJECTIVE:  Hypotensive yesterday afternoon.  Resolved - now HYPERtensive  On PS wean but still requiring PS 12/5.  Awake, follows some commands but drowsy  VITAL SIGNS: BP (!) 175/102   Pulse (!) 102   Temp (!) 101.2 F (38.4 C) (Oral)   Resp (!) 26   Ht 5\' 3"  (1.6 m)   Wt (!) 142.6 kg (314 lb 6 oz)   SpO2 98%   BMI 55.69 kg/m   HEMODYNAMICS:    VENTILATOR SETTINGS: Vent Mode: PSV;CPAP FiO2 (%):  [40 %] 40 % Set Rate:  [24 bmp] 24 bmp Vt Set:  [420 mL] 420 mL PEEP:  [5 cmH20] 5 cmH20 Pressure Support:  [12 cmH20] 12 cmH20 Plateau Pressure:  [18 cmH20-23 cmH20] 23 cmH20  INTAKE / OUTPUT: I/O last 3 completed shifts: In: 5695.3 [I.V.:3825.3; NG/GT:370; IV Piggyback:1500] Out: 2165 [Urine:1605; Emesis/NG output:560]  PHYSICAL EXAMINATION: General: Morbidly obese female, NAD on vent   HEENT: ETT, mm moist Neuro: drowsy on precedex, follows some commands Cardiac: s1s2 distant  PULM: resps even/non-labored on PS 12/5, diminished throughout  WN:IOEV, non-tender, bsx4 active obese Extremities: warm/dry, 1+ edema  Skin: no rashes or lesions  LABS:  BMET Recent Labs  Lab 11/20/17 0219 11/20/17 1819 11/21/17 0543  NA 138 138 137  K 4.0 4.7 5.3*  CL 105 108 106  CO2 19* 19* 20*  BUN 21* 24* 24*  CREATININE 1.27* 1.57* 1.23*  GLUCOSE 224* 211* 326*    Electrolytes Recent Labs  Lab 11/20/17 0219 11/20/17 1410 11/20/17 1819 11/21/17 0543  CALCIUM 9.7  --  8.7* 9.0  MG  --  1.9 1.7 2.1  PHOS  --  3.9 3.2 2.7    CBC Recent Labs  Lab 11/20/17 0219 11/20/17 1410 11/21/17 0543  WBC 13.3* 9.5 9.7  HGB 14.2 12.6 13.5  HCT 42.5 39.3 41.1  PLT 402* 338 322    Coag's No results for input(s): APTT, INR in the last 168 hours.  Sepsis Markers No results for input(s): LATICACIDVEN, PROCALCITON, O2SATVEN in the last 168 hours.  ABG Recent Labs  Lab 11/20/17 0302 11/20/17 0610  PHART 7.140* 7.392  PCO2ART 52.0* 34.7  PO2ART 229.0* 115*    Liver Enzymes Recent Labs  Lab 11/20/17 0219 11/20/17 1410 11/20/17 1819  AST 33 46*  --   ALT 26 50  --  ALKPHOS 111 123  --   BILITOT 0.8 0.9  --   ALBUMIN 3.9 3.3* 3.3*    Cardiac Enzymes No results for input(s): TROPONINI, PROBNP in the last 168 hours.  Glucose Recent Labs  Lab 11/20/17 1158 11/20/17 1507 11/20/17 1925 11/20/17 2327 11/21/17 0318 11/21/17 0752  GLUCAP 200* 207* 243* 262* 283* 306*    Imaging US Renal  Result Date: 11/20/2017 CLINICAL DATA:  42 year old female with decreased urinary output. History of diabetes. EXAM: RENAL / URINARY TRACT ULTRASOUND COMPLETE COMPARISON:  CT of the abdomen pelvis dated 01/16/2017 FINDINGS: Right Kidney: Length: 12.4 cm. Normal echogenicity. No hydronephrosis or shadowing stone. Left Kidney: Length: 12.7 cm. Normal echogenicity. No  hydronephrosis or shadowing stone. Focal area of prominence of the superior pole of the left kidney may be related to lobulated contour. This area is poorly visualized and not well evaluated. A mass is less likely but not entirely excluded. MRI may provide better characterization if clinically indicated. Bladder: The urinary bladder is collapsed. IMPRESSION: 1. No hydronephrosis or shadowing stone. 2. Focal area of lobulation and irregularity of the superior pole of the left kidney, incompletely characterized. MRI may provide better evaluation if clinically indicated. Electronically Signed   By: Anner Crete M.D.   On: 11/20/2017 18:46   Dg Chest Port 1 View  Result Date: 11/21/2017 CLINICAL DATA:  Respiratory failure EXAM: PORTABLE CHEST 1 VIEW COMPARISON:  11/20/2017 FINDINGS: Endotracheal tube and nasogastric catheter are again seen and stable. The cardiac shadow is mildly prominent but stable. Stable vascular congestion is identified. No focal infiltrate is identified. No bony abnormality is noted. IMPRESSION: Stable vascular congestion when compare with the prior exam. Electronically Signed   By: Inez Catalina M.D.   On: 11/21/2017 09:11     STUDIES:  CT head 3/13 > unremarkable  CULTURES:   ANTIBIOTICS:   SIGNIFICANT EVENTS: 3/13 admit after intentional overdose of baclofen and tramadol.   LINES/TUBES: ETT 3/13 >  DISCUSSION: 42 year old morbidly obese female with complex medical history. Overdosed intentionally on   ASSESSMENT / PLAN:  PULMONARY A: Inability to protect airway in the setting of intentional overdose OSA/OHS on home CPAP   P:   Cont PS wean - decrease PS to 8  May be able to extubate later today  qhs CPAP once extubated  F/u CXR    CARDIOVASCULAR A:  Hypotension - resolved  H/o HTN, HLD  P:  Telemetry monitoring Aggressive volume resuscitation Holding home meds  Will add PRN hydralazine   RENAL Hyperkalemia - mild  AKI - improved   P:    KVO IVF  Follow BMP  GASTROINTESTINAL A:   No acute issues  P:   NPO PPI  HEMATOLOGIC No acute issues  P:  Follow CBC subQ heparin  INFECTIOUS A:   No acute issues  P:   Monitor temperature and white blood count off abx   ENDOCRINE A:   DM Cushings Hypothyroid    P:   CBG monitoring and SSI q 4 hours Assess cortisol, TSH Resume home synthroid  ICU hyperglycemia protocol  Holding home oral DM meds   NEUROLOGIC A:   Acute toxic encephalopathy - secondary to overdose of baclofen (at least 150mg  estimated) and tramadol (at least 1500mg  estimated). Co-ingestants negative Seizure: likely secondary to overdose. None further  Depression P:   RASS goal: 0 to -1 fent gtt off  Wean precedex as able  Daily WUA  Holding home baclofen, tramadol, venlafaxine Will need  psych eval once extubated   FAMILY  - Updates: 11/21/2017 husband and mom updated at bedside  - Inter-disciplinary family meet or Palliative Care meeting due by:  3/20   App cct 30 min   Nickolas Madrid, NP 11/21/2017  11:21 AM Pager: ([phone]) [phone] or (336) [phone]

## 2017-11-21 NOTE — Progress Notes (Signed)
Inpatient Diabetes Program Recommendations  AACE/ADA: New Consensus Statement on Inpatient Glycemic Control (2015)  Target Ranges:  Prepandial:   less than 140 mg/dL      Peak postprandial:   less than 180 mg/dL (1-2 hours)      Critically ill patients:  140 - 180 mg/dL   Lab Results  Component Value Date   GLUCAP 306 (H) 11/21/2017   HGBA1C 7.2 (H) 11/20/2017    Review of Glycemic Control  Diabetes history: Type 2 Outpatient Diabetes medications: 70/30 28 U BID, Metformin, Actos Current orders for Inpatient glycemic control: Novolog MODERATE every 4 hours.  Inpatient Diabetes Program Recommendations:    Note that patient has blood sugars greater than 180 mg/dl. Recommend the ICU hyperglycemia protocol while in the ICU, on tube feedings, and intubated.  Patient needs basal insulin. Lantus 30 units daily would equal her home dosage of 70/30 28 units BID. Will continue to monitor blood sugars while in the hospital.  Harvel Ricks RN BSN CDE Diabetes Coordinator Pager: [phone]  8am-5pm

## 2017-11-21 NOTE — Progress Notes (Signed)
Pt became alert awake and very agitated when I changed out HME and suction cath came out of both mittens RN and myself both at bedside trying to keep patient from pulling et tube out etc... Warren Lacy was called order for restraints was given and sedation.. pt settling down now SP02 is 96 RT will continue to monitor

## 2017-11-21 NOTE — Progress Notes (Signed)
Pt became very agitated when Respiratory tried to change her breathing tubes and HME. Elink MD notified, and restraints were ordered. Versed PRN was given to help with agitation.

## 2017-11-22 ENCOUNTER — Inpatient Hospital Stay (HOSPITAL_COMMUNITY): Payer: Medicaid Other

## 2017-11-22 DIAGNOSIS — T43212A Poisoning by selective serotonin and norepinephrine reuptake inhibitors, intentional self-harm, initial encounter: Secondary | ICD-10-CM

## 2017-11-22 DIAGNOSIS — F419 Anxiety disorder, unspecified: Secondary | ICD-10-CM

## 2017-11-22 DIAGNOSIS — T404X2A Poisoning by other synthetic narcotics, intentional self-harm, initial encounter: Secondary | ICD-10-CM

## 2017-11-22 DIAGNOSIS — R450 Nervousness: Secondary | ICD-10-CM

## 2017-11-22 DIAGNOSIS — F332 Major depressive disorder, recurrent severe without psychotic features: Secondary | ICD-10-CM | POA: Diagnosis present

## 2017-11-22 DIAGNOSIS — T428X2A Poisoning by antiparkinsonism drugs and other central muscle-tone depressants, intentional self-harm, initial encounter: Principal | ICD-10-CM

## 2017-11-22 DIAGNOSIS — T1491XA Suicide attempt, initial encounter: Secondary | ICD-10-CM

## 2017-11-22 LAB — BASIC METABOLIC PANEL
ANION GAP: 9 (ref 5–15)
BUN: 22 mg/dL — ABNORMAL HIGH (ref 6–20)
CHLORIDE: 111 mmol/L (ref 101–111)
CO2: 22 mmol/L (ref 22–32)
Calcium: 9 mg/dL (ref 8.9–10.3)
Creatinine, Ser: 0.79 mg/dL (ref 0.44–1.00)
GFR calc non Af Amer: >60 mL/min (ref >60)
Glucose, Bld: 241 mg/dL — ABNORMAL HIGH (ref 65–99)
POTASSIUM: 3.9 mmol/L (ref 3.5–5.1)
Sodium: 142 mmol/L (ref 135–145)

## 2017-11-22 LAB — BLOOD GAS, ARTERIAL
Acid-base deficit: 0.8 mmol/L (ref 0.0–2.0)
Bicarbonate: 23.8 mmol/L (ref 20.0–28.0)
FIO2: 0.4
LHR: 24 {breaths}/min
MECHVT: 420 mL
O2 SAT: 96.9 %
PATIENT TEMPERATURE: 98.6
PCO2 ART: 42.5 mmHg (ref 32.0–48.0)
PEEP: 5 cmH2O
PH ART: 7.367 (ref 7.350–7.450)
PO2 ART: 95.2 mmHg (ref 83.0–108.0)

## 2017-11-22 LAB — GLUCOSE, CAPILLARY
GLUCOSE-CAPILLARY: 219 mg/dL — AB (ref 65–99)
Glucose-Capillary: 175 mg/dL — ABNORMAL HIGH (ref 65–99)
Glucose-Capillary: 222 mg/dL — ABNORMAL HIGH (ref 65–99)
Glucose-Capillary: 260 mg/dL — ABNORMAL HIGH (ref 65–99)
Glucose-Capillary: 307 mg/dL — ABNORMAL HIGH (ref 65–99)
Glucose-Capillary: 310 mg/dL — ABNORMAL HIGH (ref 65–99)

## 2017-11-22 MED ORDER — INSULIN ASPART 100 UNIT/ML ~~LOC~~ SOLN
0.0000 [IU] | SUBCUTANEOUS | Status: DC
  Administered 2017-11-22: 11 [IU] via SUBCUTANEOUS
  Administered 2017-11-22: 15 [IU] via SUBCUTANEOUS
  Administered 2017-11-23 (×3): 4 [IU] via SUBCUTANEOUS

## 2017-11-22 MED ORDER — FUROSEMIDE 10 MG/ML IJ SOLN
40.0000 mg | Freq: Two times a day (BID) | INTRAMUSCULAR | Status: AC
  Administered 2017-11-22 – 2017-11-23 (×2): 40 mg via INTRAVENOUS
  Filled 2017-11-22 (×2): qty 4

## 2017-11-22 MED ORDER — INSULIN ASPART 100 UNIT/ML ~~LOC~~ SOLN
4.0000 [IU] | SUBCUTANEOUS | Status: DC
  Administered 2017-11-22: 4 [IU] via SUBCUTANEOUS

## 2017-11-22 MED ORDER — INSULIN GLARGINE 100 UNIT/ML ~~LOC~~ SOLN
28.0000 [IU] | Freq: Every day | SUBCUTANEOUS | Status: DC
  Administered 2017-11-22: 28 [IU] via SUBCUTANEOUS
  Filled 2017-11-22: qty 0.28

## 2017-11-22 MED ORDER — INSULIN GLARGINE 100 UNIT/ML ~~LOC~~ SOLN
15.0000 [IU] | Freq: Every day | SUBCUTANEOUS | Status: DC
  Filled 2017-11-22: qty 0.15

## 2017-11-22 NOTE — Procedures (Signed)
Extubation Procedure Note  Patient Details:   Name: [name] DOB: 08-Jul-1976 MRN: [id number]   Airway Documentation:  AIRWAYS (Active)     AIRWAYS (Active)     Airway 7.5 mm (Active)  Secured at (cm) 23 cm 11/22/2017 11:15 AM  Measured From Lips 11/22/2017 11:15 AM  Secured Location Left 11/22/2017 11:15 AM  Secured By [employer] 11/22/2017 11:15 AM  Tube [name] Repositioned Yes 11/22/2017 11:15 AM  Cuff Pressure (cm H2O) 28 cm H2O 11/22/2017  7:20 AM  Site Condition Dry 11/22/2017 11:15 AM    Evaluation  O2 sats: stable throughout Complications: No apparent complications Patient did tolerate procedure well. Bilateral Breath Sounds: Clear, Diminished   Yes   Patient extubated per order to 4L [place] with no apparent complications. Positive cuff leak was noted prior to extubation. Patient is able to speak and is alert and oriented to place. Vitals are stable. RT will continue to monitor.   Kippy Clyda Greener 11/22/2017, 2:18 PM

## 2017-11-22 NOTE — Progress Notes (Signed)
Patient has home unit at bedside. RT put sterile water in water chamber. Patient tolerating well at this time.

## 2017-11-22 NOTE — Progress Notes (Signed)
PULMONARY / CRITICAL CARE MEDICINE   Name: [name] MRN: [id number] DOB: 28-May-1976    ADMISSION DATE:  11/20/2017 CONSULTATION DATE:  3/13  REFERRING MD:  Dr. Christy Gentles  CHIEF COMPLAINT:  Encephalopathy/Overdose (intentional)  HISTORY OF PRESENT ILLNESS:   42 year old female with PMH as below, which is significant for cushing's disease, DM, depression, HTN, morbid obesity, and OSA. Within the past few days she has had a complicated family situation at home, which has caused a lot of distress. Otherwise her health has been in its usual state. In the evening hours of 3/12 she contacted her husband while he was working to inform him she was going to overdose. He went home and she had already taken approximately #15 10mg  Baclofen and 100mg  trazadone. She then went on to take "at least #30" 50mg  tramadol. She would not initially come to the ED. Her husband called poison control and watched her. When she became encephalopathic and tremulous she agreed to come to ED. She suffered a generalized seizure in triage and was brought back to ED where she was emergently intubated. PCCM asked to admit.     SUBJECTIVE:  Awake and alert On PS wean, 40 %, 5/10, following commands off sedation T max 101.2 + 5L    VITAL SIGNS: BP (!) 160/82   Pulse (!) 119   Temp (!) 100.9 F (38.3 C) (Oral)   Resp (!) 23   Ht 4\' 11"  (1.499 m)   Wt (!) 316 lb 12.8 oz (143.7 kg)   SpO2 100%   BMI 63.99 kg/m   HEMODYNAMICS:    VENTILATOR SETTINGS: Vent Mode: PSV;CPAP FiO2 (%):  [40 %] 40 % Set Rate:  [16 bmp] 16 bmp Vt Set:  [420 mL] 420 mL PEEP:  [5 cmH20] 5 cmH20 Pressure Support:  [8 cmH20-10 cmH20] 10 cmH20  INTAKE / OUTPUT: I/O last 3 completed shifts: In: 6440.1 [I.V.:5580.1; NG/GT:860] Out: 4401 [Urine:3675; Emesis/NG output:50]  PHYSICAL EXAMINATION: General: Morbidly obese female, weaning on vent, awake and alert HEENT: ETT, mm moist, thick neck. OG tube Neuro: MAE x 4, Alert, lip speaks  , coughs on command Cardiac: s1s2 distant, RRR, No RMG  PULM: resps even/non-labored on PS 5/10, diminished throughout, sats 97%  UU:VOZD, non-tender, non-distended, bsx4 active obese Extremities: warm/dry, 1+ edema  Skin: no rashes or lesions, warm dry and intact  LABS:  BMET Recent Labs  Lab 11/20/17 1819 11/21/17 0543 11/22/17 0433  NA 138 137 142  K 4.7 5.3* 3.9  CL 108 106 111  CO2 19* 20* 22  BUN 24* 24* 22*  CREATININE 1.57* 1.23* 0.79  GLUCOSE 211* 326* 241*    Electrolytes Recent Labs  Lab 11/20/17 1819 11/21/17 0543 11/21/17 1752 11/22/17 0433  CALCIUM 8.7* 9.0  --  9.0  MG 1.7 2.1 1.9  --   PHOS 3.2 2.7 2.4*  --     CBC Recent Labs  Lab 11/20/17 0219 11/20/17 1410 11/21/17 0543  WBC 13.3* 9.5 9.7  HGB 14.2 12.6 13.5  HCT 42.5 39.3 41.1  PLT 402* 338 322    Coag's No results for input(s): APTT, INR in the last 168 hours.  Sepsis Markers No results for input(s): LATICACIDVEN, PROCALCITON, O2SATVEN in the last 168 hours.  ABG Recent Labs  Lab 11/20/17 0302 11/20/17 0610 11/22/17 0257  PHART 7.140* 7.392 7.367  PCO2ART 52.0* 34.7 42.5  PO2ART 229.0* 115* 95.2    Liver Enzymes Recent Labs  Lab 11/20/17 0219 11/20/17 1410 11/20/17  1819  AST 33 46*  --   ALT 26 50  --   ALKPHOS 111 123  --   BILITOT 0.8 0.9  --   ALBUMIN 3.9 3.3* 3.3*    Cardiac Enzymes No results for input(s): TROPONINI, PROBNP in the last 168 hours.  Glucose Recent Labs  Lab 11/21/17 1148 11/21/17 1612 11/21/17 1953 11/21/17 2341 11/22/17 0348 11/22/17 0741  GLUCAP 337* 286* 222* 205* 222* 219*    Imaging No results found.   STUDIES:  CT head 3/13 > unremarkable  CULTURES:   ANTIBIOTICS:   SIGNIFICANT EVENTS: 3/13 admit after intentional overdose of baclofen and tramadol.  3/15 weaning awake and alert, strong cough >>> looks ready for extubation  LINES/TUBES: ETT 3/13 >  DISCUSSION: 42 year old morbidly obese female with complex  medical history. Overdosed intentionally intubated for airway protection.  ASSESSMENT / PLAN:  PULMONARY A: Inability to protect airway in the setting of intentional overdose OSA/OHS on home CPAP   P:   Cont PS wean - decrease PS to 8 and 30%  Hopeful to extubate 3/15  qhs CPAP/ BiPAP  once extubated  CXR now F/u CXR    CARDIOVASCULAR A:  Hypotension - resolved  HTN H/o HTN, HLD + 5 L since admission  P:  Telemetry monitoring Decrease IVF to 75 cc Holding home meds  Continue PRN hydralazine  Consider Lasix 20 x 1  RENAL Hyperkalemia - resolved  AKI - improved   P:   KVO IVF  Follow BMP/ UO Avoid nephrotoxic medications Maintain renal perfusion MAP goal > 65  GASTROINTESTINAL A:   Gastric motility issues Takes reglan at home Vomited TF 3/14  P:   TF at 20 cc / hr on hold for possible extubation PPI Consider adding reglan if QTc is WNL  HEMATOLOGIC No acute issues  P:  Follow CBC subQ heparin Monitor for active bleeding Transfuse for HGB < 7  INFECTIOUS A:   T Max of 101.2 No CBC/ WBC on 3/15  P:   CBC 3/16 Monitor temperature and white blood count off abx    ENDOCRINE A:   DM Cushings Hypothyroid    P:   CBG monitoring and SSI q 4 hours Resume home synthroid  ICU hyperglycemia protocol  Will increase Lantus to 15 U Q hs Holding home oral DM meds   NEUROLOGIC A:   Acute toxic encephalopathy - secondary to overdose of baclofen (at least 150mg  estimated) and tramadol (at least 1500mg  estimated). Co-ingestants negative>> resolved Seizure: likely secondary to overdose. None further  Depression Awake and alert 3/15 P:   RASS goal: 0 to -1 fent gtt off  precedex off  Daily WUA  Holding home baclofen, tramadol, venlafaxine Will need psych eval once extubated Will need safety sitter once extubated   FAMILY  - Updates: 11/22/2017 mom updated at bedside  - Inter-disciplinary family meet or Palliative Care meeting due by:   3/20   App cct 30 min   Magdalen Spatz, AGACNP-BC [place] Pager # [phone] 11/22/2017  9:33 AM

## 2017-11-22 NOTE — Plan of Care (Signed)
Discussed care plans with patient, no particular level of understanding noted

## 2017-11-22 NOTE — Consult Note (Signed)
[facility] Face-to-Face Psychiatry Consult   Reason for Consult:  Intentional overdose Referring Physician:  Dr. Gilford Raid Patient Identification: [name] MRN:  [id number] Principal Diagnosis: Severe episode of recurrent major depressive disorder, without psychotic features ([place]) Diagnosis:   Patient Active Problem List   Diagnosis Date Noted  . Intentional overdose of drug in tablet form ([place]) [T50.902A] 11/20/2017  . Intentional drug overdose ([place]) [T50.902A]   . Seizure ([place]) [R56.9]   . Acute respiratory failure with hypercapnia ([place]) [J96.02]   . Status epilepticus ([place]) [G40.901]   . 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 Abdominal pain, epigastric [R10.13]   . LUQ abdominal pain [R10.12]   . Nausea and vomiting [R11.2]   . Gastroparesis [K31.84]   . Hypertriglyceridemia [E78.1] 07/22/2015  . Diabetes mellitus due to underlying condition, uncontrolled, with diabetic neuropathy, with long-term current use of insulin (HCC) [E08.40, E08.65, Z79.4]   . Vitamin D deficiency [E55.9] 07/19/2015  . 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 Osteoporosis [M81.0] 07/19/2015  . 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 Fracture, tibial plateau [S82.143A] 07/16/2015  . Uncontrolled diabetes mellitus with diabetic neuropathy, with long-term current use of insulin ([place]) [E11.40, Z79.4, E11.65] 07/16/2015  . Leukocytosis [D72.829] 07/16/2015  . Essential hypertension [I10] 07/16/2015  . Morbid obesity ([place]) [E66.01] 04/20/2014  . Hyperlipidemia [E78.5] 07/31/2012  . Cushing disease ([place]) [E24.0] 07/31/2012  . Hypothyroid [E03.9] 07/31/2012  . Major depressive disorder, recurrent episode, mild ([place]) [F33.0] 04/08/2012    Total Time spent with patient: 1 hour  Subjective:   [name] is a 42 y.o. female patient  admitted with acute on chronic respiratory failure secondary to drug overdose.  HPI:   Per chart review, patient has a history of depression. She overdosed on Baclofen 10 mg tablets (#15), Trazodone 100 mg tablets (#15) and Tramadol 50 mg tablets (#30) while at home alone. She contacted her husband prior to her overdose to inform him that she was going to overdose. He came home and tried to get her to go to the hospital but she refused. He called poison control and closely monitored her. She became encephalopathic and tremulous and then agreed to come to the hospital. She had a seizure en route to the hospital. She required intubation for airway protection. She has been dealing with family stressors. Home medications include Wellbutrin SR 100 mg BID, Gabapentin 600 mg BID and Atarax 50 mg qhs. UDS and BAL were negative on admission. Salicylate level was negative.   On interview, [name] was withdrawn and provided minimal responses to questions. She reports overdosing to end her life. She ingested Baclofen, Trazodone and Tramadol. She denies ingesting other drugs or concurrent alcohol or illicit substance use. She reports current SI. She reports that it has been stressful caring for a special needs child. She reports depression for several months with intermittent SI. She denies a history of prior suicide attempts or self-injurious behaviors. She is unsure why she acted on these thoughts this time. She also reports anxiety and poor sleep with problems falling asleep. She denies anhedonia. She enjoys dancing. She denies problems with appetite. She denies HI or AVH. She reports compliance with Wellbutrin and Atarax. She has been on these medications for several years with no recent changes.   Past Psychiatric History: MDD   Risk to Self: Is patient at risk for suicide?: Yes Risk to  Others:  None. Denies HI. Prior Inpatient Therapy:  Denies  Prior Outpatient Therapy:   Her medications are managed by her  outpatient provider.    Past Medical History:  Past Medical History:  Diagnosis Date  . Anxiety   . Complication of anesthesia    Pt. states takes long time to wake up from it.   . Cushing's disease ([place])   . Depression   . Diabetes ([place])   . Hyperlipidemia   . Hyperlipidemia   . Hypertension   . Morbid obesity ([place])   . Osteoporosis 07/19/2015  . 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 Sleep apnea   . Uncontrolled diabetes mellitus with diabetic neuropathy, with long-term current use of insulin ([place]) 07/16/2015  . Vitamin D deficiency 07/19/2015    Past Surgical History:  Procedure Laterality Date  . ANTERIOR TALOFIBULAR LIGAMENT REPAIR Left 11/15/2014   Procedure: ANTERIOR TALOFIBULAR LIGAMENT REPAIR;  Surgeon: Jana Half, DPM;  Location: [place];  Service: Podiatry;  Laterality: Left;  . CESAREAN SECTION  dec 1997/  06-03-2001/   01-01-2005   BILATERAL TUBAL LIGATION WITH LAST ONE  . DILATION AND CURETTAGE OF UTERUS  1995   WITH SUCTION  . ESOPHAGOGASTRODUODENOSCOPY (EGD) WITH PROPOFOL N/A 10/04/2016   Procedure: ESOPHAGOGASTRODUODENOSCOPY (EGD) WITH PROPOFOL;  Surgeon: [name], MD;  Location: [facility];  Service: Endoscopy;  Laterality: N/A;  . LAPAROSCOPIC CHOLECYSTECTOMY  09-25-2005  . ORIF TIBIA PLATEAU Right 07/19/2015   Procedure: OPEN REDUCTION INTERNAL FIXATION (ORIF) RIGHT TIBIAL PLATEAU;  Surgeon: Altamese Ballou, MD;  Location: [facility];  Service: Orthopedics;  Laterality: Right;  . PARTIAL KNEE ARTHROPLASTY Right 04/19/2014   Procedure: RIGHT UNI KNEE ARTHROPLASTY MEDIALLY ;  Surgeon: Mauri Pole, MD;  [facility];  Service: Orthopedics;  Laterality: Right;  . TUBAL LIGATION     Family History:  Family History  Adopted: Yes  Problem Relation Age of Onset  . Autism Son   . ADD / ADHD Son   . Apraxia Son    Family Psychiatric  History: Son-Autism and ADHD.  Social History:   Social History   Substance and Sexual Activity  Alcohol Use Yes   Comment: RARE     Social History   Substance and Sexual Activity  Drug Use No    Social History   Socioeconomic History  . Marital status: Married    Spouse name: [name]  . Number of children: 2  . Years of education: None  . Highest education level: None  Social Needs  . Financial resource strain: None  . Food insecurity - worry: None  . Food insecurity - inability: None  . Transportation needs - medical: None  . Transportation needs - non-medical: None  Occupational History  . None  Tobacco Use  . Smoking status: Current Every Day Smoker    Years: 4.00    Types: Cigarettes  . Smokeless tobacco: Never Used  Substance and Sexual Activity  . Alcohol use: Yes    Comment: RARE  . Drug use: No  . Sexual activity: Yes    Partners: Male    Birth control/protection: Pill  Other Topics Concern  . None  Social History Narrative  . None   Additional Social History: She lives at home with her husband and young son. She is unemployed and receives disability. She denies alcohol or illicit substance use.    Allergies:   Allergies  Allergen Reactions  .  Penicillins Hives    Has patient had a PCN reaction causing immediate rash, facial/tongue/throat swelling, SOB or lightheadedness with hypotension: Yes Has patient had a PCN reaction causing severe rash involving mucus membranes or skin necrosis: Yes Has patient had a PCN reaction that required hospitalization No Has patient had a PCN reaction occurring within the last 10 years: No If all of the above answers are "NO", then may proceed with Cephalosporin use.     Labs:  Results for orders placed or performed during the hospital encounter of 11/20/17 (from the past 48 hour(s))  Hemoglobin A1c     Status: Abnormal   Collection Time: 11/20/17  2:10 PM  Result Value Ref Range   Hgb A1c MFr Bld 7.2 (H) 4.8 - 5.6 %    Comment: (NOTE) Pre diabetes:           5.7%-6.4% Diabetes:              >6.4% Glycemic control for   <7.0% adults with diabetes    Mean Plasma Glucose 159.94 mg/dL    Comment: Performed at [facility], [place] [address] [place], [place] [address]  Hepatic function panel     Status: Abnormal   Collection Time: 11/20/17  2:10 PM  Result Value Ref Range   Total Protein 6.3 (L) 6.5 - 8.1 g/dL   Albumin 3.3 (L) 3.5 - 5.0 g/dL   AST 46 (H) 15 - 41 U/L   ALT 50 14 - 54 U/L   Alkaline Phosphatase 123 38 - 126 U/L   Total Bilirubin 0.9 0.3 - 1.2 mg/dL   Bilirubin, Direct 0.2 0.1 - 0.5 mg/dL   Indirect Bilirubin 0.7 0.3 - 0.9 mg/dL    Comment: Performed at [facility] [address], [place], [place] [address]  Magnesium     Status: None   Collection Time: 11/20/17  2:10 PM  Result Value Ref Range   Magnesium 1.9 1.7 - 2.4 mg/dL    Comment: Performed at [facility], [place] [address], [place], [place] [address]  Phosphorus     Status: None   Collection Time: 11/20/17  2:10 PM  Result Value Ref Range   Phosphorus 3.9 2.5 - 4.6 mg/dL    Comment: Performed at [facility] [address], [place], [place] [address]  CBC     Status: None   Collection Time: 11/20/17  2:10 PM  Result Value Ref Range   WBC 9.5 4.0 - 10.5 K/uL   RBC 4.42 3.87 - 5.11 MIL/uL   Hemoglobin 12.6 12.0 - 15.0 g/dL   HCT 39.3 36.0 - 46.0 %   MCV 88.9 78.0 - 100.0 fL   MCH 28.5 26.0 - 34.0 pg   MCHC 32.1 30.0 - 36.0 g/dL   RDW 12.8 11.5 - 15.5 %   Platelets 338 150 - 400 K/uL    Comment: Performed at [facility], [place] [address] [place], [place] [address]  Glucose, capillary     Status: Abnormal   Collection Time: 11/20/17  3:07 PM  Result Value Ref Range   Glucose-Capillary 207 (H) 65 - 99 mg/dL  Magnesium     Status: None   Collection Time: 11/20/17  6:19 PM  Result Value Ref Range   Magnesium 1.7 1.7 - 2.4 mg/dL    Comment: Performed at [facility], [place] [address], [place],  [address]  Renal function  panel     Status:  Abnormal   Collection Time: 11/20/17  6:19 PM  Result Value Ref Range   Sodium 138 135 - 145 mmol/L   Potassium 4.7 3.5 - 5.1 mmol/L   Chloride 108 101 - 111 mmol/L   CO2 19 (L) 22 - 32 mmol/L   Glucose, Bld 211 (H) 65 - 99 mg/dL   BUN 24 (H) 6 - 20 mg/dL   Creatinine, Ser 1.57 (H) 0.44 - 1.00 mg/dL   Calcium 8.7 (L) 8.9 - 10.3 mg/dL   Phosphorus 3.2 2.5 - 4.6 mg/dL   Albumin 3.3 (L) 3.5 - 5.0 g/dL   GFR calc non Af Amer 40 (L) >60 mL/min   GFR calc Af Amer 46 (L) >60 mL/min    Comment: (NOTE) The eGFR has been calculated using the CKD EPI equation. This calculation has not been validated in all clinical situations. eGFR's persistently <60 mL/min signify possible Chronic Kidney Disease.    Anion gap 11 5 - 15    Comment: Performed at [facility] [address], [place], [place] [address]  Glucose, capillary     Status: Abnormal   Collection Time: 11/20/17  7:25 PM  Result Value Ref Range   Glucose-Capillary 243 (H) 65 - 99 mg/dL  Glucose, capillary     Status: Abnormal   Collection Time: 11/20/17 11:27 PM  Result Value Ref Range   Glucose-Capillary 262 (H) 65 - 99 mg/dL  Glucose, capillary     Status: Abnormal   Collection Time: 11/21/17  3:18 AM  Result Value Ref Range   Glucose-Capillary 283 (H) 65 - 99 mg/dL  CBC with Differential/Platelet     Status: Abnormal   Collection Time: 11/21/17  5:43 AM  Result Value Ref Range   WBC 9.7 4.0 - 10.5 K/uL   RBC 4.58 3.87 - 5.11 MIL/uL   Hemoglobin 13.5 12.0 - 15.0 g/dL   HCT 41.1 36.0 - 46.0 %   MCV 89.7 78.0 - 100.0 fL   MCH 29.5 26.0 - 34.0 pg   MCHC 32.8 30.0 - 36.0 g/dL   RDW 13.0 11.5 - 15.5 %   Platelets 322 150 - 400 K/uL   Neutrophils Relative % 94 %   Neutro Abs 9.1 (H) 1.7 - 7.7 K/uL   Lymphocytes Relative 4 %   Lymphs Abs 0.4 (L) 0.7 - 4.0 K/uL   Monocytes Relative 2 %   Monocytes Absolute 0.1 0.1 - 1.0 K/uL   Eosinophils Relative 0 %   Eosinophils Absolute 0.0 0.0 - 0.7 K/uL    Basophils Relative 0 %   Basophils Absolute 0.0 0.0 - 0.1 K/uL    Comment: Performed at [facility], [address] [place], [place] [address]  Basic metabolic panel     Status: Abnormal   Collection Time: 11/21/17  5:43 AM  Result Value Ref Range   Sodium 137 135 - 145 mmol/L   Potassium 5.3 (H) 3.5 - 5.1 mmol/L   Chloride 106 101 - 111 mmol/L   CO2 20 (L) 22 - 32 mmol/L   Glucose, Bld 326 (H) 65 - 99 mg/dL   BUN 24 (H) 6 - 20 mg/dL   Creatinine, Ser 1.23 (H) 0.44 - 1.00 mg/dL   Calcium 9.0 8.9 - 10.3 mg/dL   GFR calc non Af Amer 53 (L) >60 mL/min   GFR calc Af Amer >60 >60 mL/min    Comment: (NOTE) The eGFR has been calculated using the CKD EPI equation. This calculation has not been  validated in all clinical situations. eGFR's persistently <60 mL/min signify possible Chronic Kidney Disease.    Anion gap 11 5 - 15    Comment: Performed at [facility] [address] [place], [place] [address]  Magnesium     Status: None   Collection Time: 11/21/17  5:43 AM  Result Value Ref Range   Magnesium 2.1 1.7 - 2.4 mg/dL    Comment: Performed at [facility] [address] [place], [place] [address]  Phosphorus     Status: None   Collection Time: 11/21/17  5:43 AM  Result Value Ref Range   Phosphorus 2.7 2.5 - 4.6 mg/dL    Comment: Performed at [facility] [address], [place], [place] [address]  Glucose, capillary     Status: Abnormal   Collection Time: 11/21/17  7:52 AM  Result Value Ref Range   Glucose-Capillary 306 (H) 65 - 99 mg/dL  Glucose, capillary     Status: Abnormal   Collection Time: 11/21/17 11:48 AM  Result Value Ref Range   Glucose-Capillary 337 (H) 65 - 99 mg/dL  Glucose, capillary     Status: Abnormal   Collection Time: 11/21/17  4:12 PM  Result Value Ref Range   Glucose-Capillary 286 (H) 65 - 99 mg/dL  Magnesium     Status: None   Collection Time: 11/21/17  5:52 PM  Result Value Ref Range   Magnesium 1.9 1.7 - 2.4 mg/dL     Comment: Performed at [facility], [place] [address], [place], [place] [address]  Phosphorus     Status: Abnormal   Collection Time: 11/21/17  5:52 PM  Result Value Ref Range   Phosphorus 2.4 (L) 2.5 - 4.6 mg/dL    Comment: Performed at [facility] [address] [place], [place] [address]  Glucose, capillary     Status: Abnormal   Collection Time: 11/21/17  7:53 PM  Result Value Ref Range   Glucose-Capillary 222 (H) 65 - 99 mg/dL  Glucose, capillary     Status: Abnormal   Collection Time: 11/21/17 11:41 PM  Result Value Ref Range   Glucose-Capillary 205 (H) 65 - 99 mg/dL  Blood gas, arterial     Status: None   Collection Time: 11/22/17  2:57 AM  Result Value Ref Range   FIO2 0.40    Delivery systems VENTILATOR    Mode PRESSURE REGULATED VOLUME CONTROL    VT 420 mL   LHR 24 resp/min   Peep/cpap 5.0 cm H20   pH, Arterial 7.367 7.350 - 7.450   pCO2 arterial 42.5 32.0 - 48.0 mmHg   pO2, Arterial 95.2 83.0 - 108.0 mmHg   Bicarbonate 23.8 20.0 - 28.0 mmol/L   Acid-base deficit 0.8 0.0 - 2.0 mmol/L   O2 Saturation 96.9 %   Patient temperature 98.6    Collection site LEFT RADIAL    Sample type ARTERIAL    Allens test (pass/fail) PASS PASS   Mechanical Rate ARTERIAL   Glucose, capillary     Status: Abnormal   Collection Time: 11/22/17  3:48 AM  Result Value Ref Range   Glucose-Capillary 222 (H) 65 - 99 mg/dL  Basic metabolic panel     Status: Abnormal   Collection Time: 11/22/17  4:33 AM  Result Value Ref Range   Sodium 142 135 - 145 mmol/L   Potassium 3.9 3.5 - 5.1 mmol/L   Chloride 111 101 - 111 mmol/L   CO2 22  22 - 32 mmol/L   Glucose, Bld 241 (H) 65 - 99 mg/dL   BUN 22 (H) 6 - 20 mg/dL   Creatinine, Ser 0.79 0.44 - 1.00 mg/dL   Calcium 9.0 8.9 - 10.3 mg/dL   GFR calc non Af Amer >60 >60 mL/min   GFR calc Af Amer >60 >60 mL/min    Comment: (NOTE) The eGFR has been calculated using the CKD EPI equation. This calculation has not been validated in all  clinical situations. eGFR's persistently <60 mL/min signify possible Chronic Kidney Disease.    Anion gap 9 5 - 15    Comment: Performed at [facility] [address] [place], [place] [address]  Glucose, capillary     Status: Abnormal   Collection Time: 11/22/17  7:41 AM  Result Value Ref Range   Glucose-Capillary 219 (H) 65 - 99 mg/dL  Glucose, capillary     Status: Abnormal   Collection Time: 11/22/17 11:46 AM  Result Value Ref Range   Glucose-Capillary 307 (H) 65 - 99 mg/dL   Comment 1 Notify RN     Current Facility-Administered Medications  Medication Dose Route Frequency Provider Last Rate Last Dose  . 0.9 %  sodium chloride infusion   Intravenous Continuous Collene Gobble, MD 75 mL/hr at 11/22/17 1000    . acetaminophen (TYLENOL) solution 650 mg  650 mg Per Tube Q4H PRN Frederik Pear, MD   650 mg at 11/22/17 0851  . chlorhexidine gluconate (MEDLINE KIT) (PERIDEX) 0.12 % solution 15 mL  15 mL Mouth Rinse BID Collene Gobble, MD   15 mL at 11/22/17 0800  . dexmedetomidine (PRECEDEX) 200 MCG/50ML (4 mcg/mL) infusion  0.4-1.2 mcg/kg/hr Intravenous Titrated Frederik Pear, MD   Stopped at 11/21/17 1200  . feeding supplement (PRO-STAT SUGAR FREE 64) liquid 30 mL  30 mL Per Tube TID Minor, Grace Bushy, NP   30 mL at 11/21/17 2211  . feeding supplement (VITAL HIGH PROTEIN) liquid 1,000 mL  1,000 mL Per Tube Continuous Ogan, Okoronkwo U, MD 20 mL/hr at 11/22/17 0800 1,000 mL at 11/22/17 0800  . furosemide (LASIX) injection 40 mg  40 mg Intravenous Q12H Collene Gobble, MD      . heparin injection 5,000 Units  5,000 Units Subcutaneous Q8H Corey Harold, NP   5,000 Units at 11/22/17 0606  . hydrALAZINE (APRESOLINE) injection 10 mg  10 mg Intravenous Q4H PRN Marijean Heath, NP   10 mg at 11/22/17 0946  . insulin aspart (novoLOG) injection 3-9 Units  3-9 Units Subcutaneous Q4H Marijean Heath, NP   9 Units at 11/22/17 0851  . insulin aspart (novoLOG) injection 4  Units  4 Units Subcutaneous Q4H Magdalen Spatz, NP      . insulin glargine (LANTUS) injection 15 Units  15 Units Subcutaneous QHS Magdalen Spatz, NP      . levothyroxine (SYNTHROID, LEVOTHROID) tablet 75 mcg  75 mcg Oral QAC breakfast Darlina Sicilian A, NP   75 mcg at 11/22/17 0851  . MEDLINE mouth rinse  15 mL Mouth Rinse 10 times per day Collene Gobble, MD   15 mL at 11/22/17 1000  . pantoprazole sodium (PROTONIX) 40 mg/20 mL oral suspension 40 mg  40 mg Per Tube QHS Scatliffe, Rise Paganini, MD   40 mg at 11/21/17 2211    Musculoskeletal: Strength & Muscle Tone: decreased due to physical deconditioning.  Gait & Station: UTA since lying in bed. Patient leans: N/A  Psychiatric Specialty Exam: Physical Exam  Nursing note and vitals reviewed. Constitutional: She is oriented to person, place, and time. She appears well-developed and well-nourished.  HENT:  Head: Normocephalic and atraumatic.  Neck: Normal range of motion.  Respiratory: Effort normal.  Musculoskeletal: Normal range of motion.  Neurological: She is alert and oriented to person, place, and time.  Skin: No rash noted.  Psychiatric: Her speech is normal. Thought content normal. She is slowed and withdrawn. Cognition and memory are normal. She expresses impulsivity. She exhibits a depressed mood.    Review of Systems  Cardiovascular: Negative for chest pain.  Gastrointestinal: Positive for nausea. Negative for abdominal pain, constipation, diarrhea and vomiting.  Psychiatric/Behavioral: Positive for depression and suicidal ideas. Negative for hallucinations and substance abuse. The patient is nervous/anxious and has insomnia.   All other systems reviewed and are negative.   Blood pressure (!) 144/82, pulse (!) 127, temperature 99.5 F (37.5 C), temperature source Oral, resp. rate (!) 32, height 4' 11"  (1.499 m), weight (!) 143.7 kg (316 lb 12.8 oz), SpO2 97 %.Body mass index is 63.99 kg/m.  General Appearance: Disheveled,  morbidly obese, Caucasian female, wearing a hospital gown with nasal cannula and lying in bed. NAD.  Eye Contact:  Poor  Speech:  Slow with hoarse voice.   Volume:  Decreased  Mood:  Anxious and Depressed  Affect:  Depressed and Tearful  Thought Process:  Goal Directed, Linear and Descriptions of Associations: Intact  Orientation:  Full (Time, Place, and Person)  Thought Content:  Logical  Suicidal Thoughts:  Yes.  with intent/plan  Homicidal Thoughts:  No  Memory:  Immediate;   Fair Recent;   Fair Remote;   Fair  Judgement:  Poor  Insight:  Fair  Psychomotor Activity:  Psychomotor Retardation  Concentration:  Concentration: Good and Attention Span: Good  Recall:  Good  Fund of Knowledge:  Fair  Language:  Good  Akathisia:  No  Handed:  Right  AIMS (if indicated):   N/A  Assets:  Housing Social Support  ADL's:  Impaired  Cognition:  WNL  Sleep:   Poor   Assessment:  [name] is a 42 y.o. female who was admitted for suicide attempt by drug overdose. She reports severe depression due to family stressors. She endorses current SI, anxiety and poor sleep. She denies HI or AVH. She warrants psychiatric hospitalization for stabilization and treatment.   Treatment Plan Summary: -Patient warrants inpatient psychiatric hospitalization given high risk of harm to self. -Continue bedside sitter.  -Continue to hold psychotropic medications until patient is medically stable to restart medications.  -Please pursue involuntary commitment if patient refuses voluntary psychiatric hospitalization or attempts to leave the hospital.  -Will sign off on patient at this time. Please consult psychiatry again as needed.    Disposition: Recommend psychiatric Inpatient admission when medically cleared.  Faythe Dingwall, DO 11/22/2017 1:42 PM

## 2017-11-22 NOTE — Progress Notes (Addendum)
Results for ANABETH, CHILCOTT (MRN [id number]) as of 11/22/2017 12:57  Ref. Range 11/21/2017 19:53 11/21/2017 23:41 11/22/2017 03:48 11/22/2017 07:41 11/22/2017 11:46  Glucose-Capillary Latest Ref Range: 65 - 99 mg/dL 222 (H) 205 (H) 222 (H) 219 (H) 307 (H)  Received Diabetes Coordinator consult in regards to insulin dosages. Spoke with staff RN about this patient.   Patient is on ICU hyperglycemia protocol Phase 1 at present. Recommend that patient be on IV insulin per protocol (Phase 2)  since CBGs have been greater than 200 mg/dl X2 and greater than 250 mg/dl X1. If patient is extubated and IV insulin is not used,  recommend increasing Lantus to 28 units (143.7 kg X 0.2 units/kg=28.74 units), Novolog 0-20 units (RESISTANT) correction scale every 4 hours, and adding Novolog 4 units every 4 hours if continuing on continuous tube feedings. If able to eat, change Novolog to 4 units TID as meal coverage, if eating at least 50% of meal, and correction scale TID & HS.  Will continue to monitor blood sugars while in the hospital.  Harvel Ricks RN BSN CDE Diabetes Coordinator Pager: [phone]  8am-5pm

## 2017-11-23 ENCOUNTER — Other Ambulatory Visit: Payer: Self-pay

## 2017-11-23 ENCOUNTER — Inpatient Hospital Stay (HOSPITAL_COMMUNITY): Payer: Medicaid Other

## 2017-11-23 ENCOUNTER — Encounter (HOSPITAL_COMMUNITY): Payer: Self-pay | Admitting: Surgery

## 2017-11-23 LAB — CBC
HEMATOCRIT: 41.3 % (ref 36.0–46.0)
Hemoglobin: 13.1 g/dL (ref 12.0–15.0)
MCH: 28.9 pg (ref 26.0–34.0)
MCHC: 31.7 g/dL (ref 30.0–36.0)
MCV: 91 fL (ref 78.0–100.0)
PLATELETS: 344 10*3/uL (ref 150–400)
RBC: 4.54 MIL/uL (ref 3.87–5.11)
RDW: 13.2 % (ref 11.5–15.5)
WBC: 12.5 10*3/uL — AB (ref 4.0–10.5)

## 2017-11-23 LAB — BASIC METABOLIC PANEL
ANION GAP: 12 (ref 5–15)
BUN: 20 mg/dL (ref 6–20)
CO2: 22 mmol/L (ref 22–32)
Calcium: 9.7 mg/dL (ref 8.9–10.3)
Chloride: 108 mmol/L (ref 101–111)
Creatinine, Ser: 0.84 mg/dL (ref 0.44–1.00)
GFR calc Af Amer: >60 mL/min (ref >60)
GLUCOSE: 207 mg/dL — AB (ref 65–99)
POTASSIUM: 4 mmol/L (ref 3.5–5.1)
Sodium: 142 mmol/L (ref 135–145)

## 2017-11-23 LAB — GLUCOSE, CAPILLARY
Glucose-Capillary: 186 mg/dL — ABNORMAL HIGH (ref 65–99)
Glucose-Capillary: 200 mg/dL — ABNORMAL HIGH (ref 65–99)
Glucose-Capillary: 300 mg/dL — ABNORMAL HIGH (ref 65–99)
Glucose-Capillary: 282 mg/dL — ABNORMAL HIGH (ref 65–99)
Glucose-Capillary: 222 mg/dL — ABNORMAL HIGH (ref 65–99)

## 2017-11-23 LAB — MAGNESIUM: Magnesium: 1.4 mg/dL — ABNORMAL LOW (ref 1.7–2.4)

## 2017-11-23 MED ORDER — INSULIN ASPART 100 UNIT/ML ~~LOC~~ SOLN
0.0000 [IU] | Freq: Every day | SUBCUTANEOUS | Status: DC
  Administered 2017-11-23: 2 [IU] via SUBCUTANEOUS
  Administered 2017-11-25: 11 [IU] via SUBCUTANEOUS
  Administered 2017-11-26: 4 [IU] via SUBCUTANEOUS
  Administered 2017-11-27 – 2017-11-28 (×2): 3 [IU] via SUBCUTANEOUS
  Administered 2017-11-29: 2 [IU] via SUBCUTANEOUS

## 2017-11-23 MED ORDER — INSULIN ASPART 100 UNIT/ML ~~LOC~~ SOLN
4.0000 [IU] | Freq: Three times a day (TID) | SUBCUTANEOUS | Status: DC
  Administered 2017-11-23 – 2017-11-26 (×9): 4 [IU] via SUBCUTANEOUS

## 2017-11-23 MED ORDER — FUROSEMIDE 10 MG/ML IJ SOLN
40.0000 mg | Freq: Two times a day (BID) | INTRAMUSCULAR | Status: AC
  Administered 2017-11-23 – 2017-11-24 (×2): 40 mg via INTRAVENOUS
  Filled 2017-11-23 (×2): qty 4

## 2017-11-23 MED ORDER — INSULIN GLARGINE 100 UNIT/ML ~~LOC~~ SOLN
28.0000 [IU] | Freq: Every day | SUBCUTANEOUS | Status: DC
  Administered 2017-11-23 – 2017-11-25 (×3): 28 [IU] via SUBCUTANEOUS
  Filled 2017-11-23 (×3): qty 0.28

## 2017-11-23 MED ORDER — SODIUM CHLORIDE 0.9 % IV SOLN
6.0000 g | Freq: Once | INTRAVENOUS | Status: AC
  Administered 2017-11-23: 6 g via INTRAVENOUS
  Filled 2017-11-23: qty 12

## 2017-11-23 MED ORDER — CHOLESTYRAMINE 4 G PO PACK
4.0000 g | PACK | ORAL | Status: DC
  Administered 2017-11-23 – 2018-01-06 (×16): 4 g via ORAL
  Filled 2017-11-23 (×24): qty 1

## 2017-11-23 MED ORDER — ACETAMINOPHEN 325 MG PO TABS
650.0000 mg | ORAL_TABLET | Freq: Once | ORAL | Status: AC
  Administered 2017-11-23: 650 mg via ORAL
  Filled 2017-11-23: qty 2

## 2017-11-23 MED ORDER — INSULIN ASPART 100 UNIT/ML ~~LOC~~ SOLN
0.0000 [IU] | Freq: Three times a day (TID) | SUBCUTANEOUS | Status: DC
  Administered 2017-11-23: 0 [IU] via SUBCUTANEOUS
  Administered 2017-11-23 – 2017-11-24 (×3): 11 [IU] via SUBCUTANEOUS
  Administered 2017-11-24: 7 [IU] via SUBCUTANEOUS
  Administered 2017-11-24: 15 [IU] via SUBCUTANEOUS
  Administered 2017-11-25: 11 [IU] via SUBCUTANEOUS
  Administered 2017-11-25: 7 [IU] via SUBCUTANEOUS
  Administered 2017-11-25: 11 [IU] via SUBCUTANEOUS
  Administered 2017-11-26 – 2017-11-27 (×4): 15 [IU] via SUBCUTANEOUS
  Administered 2017-11-27: 20 [IU] via SUBCUTANEOUS
  Administered 2017-11-27: 7 [IU] via SUBCUTANEOUS
  Administered 2017-11-28: 20 [IU] via SUBCUTANEOUS
  Administered 2017-11-29 (×2): 11 [IU] via SUBCUTANEOUS
  Administered 2017-11-29: 7 [IU] via SUBCUTANEOUS
  Administered 2017-11-30: 11 [IU] via SUBCUTANEOUS

## 2017-11-23 NOTE — Progress Notes (Signed)
[facility] ADULT ICU REPLACEMENT PROTOCOL FOR AM LAB REPLACEMENT ONLY  The patient does apply for the [facility] Adult ICU Electrolyte Replacment Protocol based on the criteria listed below:   1. Is GFR >/= 40 ml/min? Yes.    Patient's GFR today is >60 2. Is urine output >/= 0.5 ml/kg/hr for the last 6 hours? Yes.   Patient's UOP is .9 ml/kg/hr 3. Is BUN < 60 mg/dL? Yes.    Patient's BUN today is 20 4. Abnormal electrolyte(s): Mg-1.4 5. Ordered repletion with: per protocol 6. If a panic level lab has been reported, has the CCM MD in charge been notified? Yes.  .   Physician:  Dr. Harlene Ramus, Philis Nettle 11/23/2017 6:53 AM

## 2017-11-23 NOTE — Evaluation (Signed)
Physical Therapy Evaluation Patient Details Name: [name] MRN: [id number] DOB: 1975/09/19 Today's Date: 11/23/2017   History of Present Illness  Patient is a 42 y/o female who presents with drug overdose and seizure. Intubated 3/13-3/15. PMH includes ORIF RLE, depression, anxiety, morbid obesity, DM, Cushing's, Rt uni knee replacement, HTN, left ankle surgery, dyslipidemia, hypothyroidism.   Clinical Impression  Patient presents with generalized weakness, tachycardia, impaired balance, decreased activity tolerance and impaired mobility s/p above. Tolerated short distance ambulation as pt motivated to get to bathroom with Min guard assist. HR ranged from 130-158 bpm. 2/4 DOE noted. No family present to determine to pt's baseline cognition. Very flat affect and delayed processing to questions asked. Pt has spouse who works and cares for autistic child PTA. Plans to d/c to behavorial health due to suicidal ideations. Will follow acutely to maximize independence and mobility while in the hospital and improve overall endurance.     Follow Up Recommendations Other (comment);Supervision for mobility/OOB(being d/ced to behavorial health)    Equipment Recommendations  None recommended by PT    Recommendations for Other Services OT consult     Precautions / Restrictions Precautions Precautions: Fall Precaution Comments: tachycardia; suicide precautions Restrictions Weight Bearing Restrictions: No      Mobility  Bed Mobility               General bed mobility comments: Up in chair upon PT arrival.  Transfers Overall transfer level: Needs assistance Equipment used: Rolling walker (2 wheeled) Transfers: Sit to/from Stand Sit to Stand: Min guard;Min assist         General transfer comment: Min guard for safety. Stood from chair x2, from toilet x1 requiring more assist from lower surfaces.  Ambulation/Gait Ambulation/Gait assistance: Min guard Ambulation Distance (Feet): 20  Feet(x2 bouts) Assistive device: Rolling walker (2 wheeled) Gait Pattern/deviations: Step-through pattern;Decreased stride length;Trunk flexed Gait velocity: decreased Gait velocity interpretation: Below normal speed for age/gender General Gait Details: Slow, mildly unsteady gait with RW for support. HR ranged from 130-158 bpm. 1/4 DOE.   Stairs            Wheelchair Mobility    Modified Rankin (Stroke Patients Only)       Balance Overall balance assessment: Needs assistance Sitting-balance support: Feet supported;No upper extremity supported Sitting balance-Leahy Scale: Fair     Standing balance support: During functional activity;Bilateral upper extremity supported Standing balance-Leahy Scale: Poor Standing balance comment: Requires UE support in standing. Total A for pericare.                              Pertinent Vitals/Pain Pain Assessment: No/denies pain    Home Living Family/patient expects to be discharged to:: Private residence Living Arrangements: Spouse/significant other;Children(16 and 90 y/o (one is autistic)) Available Help at Discharge: Family;Available PRN/intermittently Type of Home: House Home Access: Stairs to enter Entrance Stairs-Rails: None Entrance Stairs-Number of Steps: 2 Home Layout: One level Home Equipment: Walker - 2 wheels;Bedside commode;Shower seat;Wheelchair - manual      Prior Function Level of Independence: Independent         Comments: Does not work, 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 Drives.      Hand Dominance        Extremity/Trunk Assessment   Upper Extremity Assessment Upper Extremity Assessment: Defer to OT evaluation    Lower Extremity Assessment Lower Extremity Assessment: Generalized weakness    Cervical / Trunk Assessment Cervical / Trunk Assessment: Normal  Communication   Communication: No difficulties  Cognition Arousal/Alertness: Awake/alert Behavior During Therapy: Flat affect Overall  Cognitive Status: No family/caregiver present to determine baseline cognitive functioning                                 General Comments: A&Ox4. At first, stated she was at home but with cues changes answer to hospital, [facility]. Not sure if she was joking??      General Comments      Exercises     Assessment/Plan    PT Assessment Patient needs continued PT services  PT Problem List Decreased strength;Decreased mobility;Decreased balance;Decreased cognition;Cardiopulmonary status limiting activity;Decreased activity tolerance       PT Treatment Interventions Functional mobility training;Balance training;Patient/family education;Gait training;Therapeutic activities;Therapeutic exercise;Stair training;DME instruction    PT Goals (Current goals can be found in the Care Plan section)  Acute Rehab PT Goals Patient Stated Goal: to feel better PT Goal Formulation: With patient Time For Goal Achievement: 12/07/17 Potential to Achieve Goals: Good    Frequency Min 3X/week   Barriers to discharge Decreased caregiver support not sure of level of support of spouse as he works    Co-evaluation               AM-PAC PT "6 Clicks" Daily Activity  Outcome Measure Difficulty turning over in bed (including adjusting bedclothes, sheets and blankets)?: None Difficulty moving from lying on back to sitting on the side of the bed? : None Difficulty sitting down on and standing up from a chair with arms (e.g., wheelchair, bedside commode, etc,.)?: Unable Help needed moving to and from a bed to chair (including a wheelchair)?: A Little Help needed walking in hospital room?: A Little Help needed climbing 3-5 steps with a railing? : A Lot 6 Click Score: 17    End of Session Equipment Utilized During Treatment: Oxygen;Gait belt Activity Tolerance: Patient limited by fatigue;Treatment limited secondary to medical complications (Comment)(tachycardic) Patient left: in  chair;with call bell/phone within reach;with nursing/sitter in room Nurse Communication: Mobility status PT Visit Diagnosis: Muscle weakness (generalized) (M62.81);Unsteadiness on feet (R26.81);Difficulty in walking, not elsewhere classified (R26.2)    Time: 1028-1100 PT Time Calculation (min) (ACUTE ONLY): 32 min   Charges:   PT Evaluation $PT Eval Moderate Complexity: 1 Mod PT Treatments $Therapeutic Activity: 8-22 mins   PT G Codes:        Wray Kearns, PT, DPT [phone]    Marguarite Arbour A Jonhatan Hearty 11/23/2017, 12:12 PM

## 2017-11-23 NOTE — Progress Notes (Signed)
Nursing Note:  Husband and Patient acknowledge that Questran 1 packet every other day mixed with 6-8 oz of orange juice is the medication that she has been taking since her gallbladder removal for diarrhea.  Will notify MD and ask for prescription while currently hospitalized.

## 2017-11-23 NOTE — Progress Notes (Signed)
Uneventful night shift.  Sitter at bedside.  Patient on cpap.  Patient spoke only a few words all night and has very flat affect.  Patient turning self in bed.  Lasix given and patient has responded well by diuresing.

## 2017-11-23 NOTE — Progress Notes (Signed)
PULMONARY / CRITICAL CARE MEDICINE   Name: [name] MRN: [id number] DOB: Oct 24, 1975    ADMISSION DATE:  11/20/2017 CONSULTATION DATE:  3/13  REFERRING MD:  Dr. Christy Gentles  CHIEF COMPLAINT:  Encephalopathy/Overdose (intentional)  HISTORY OF PRESENT ILLNESS:   42 year old female with PMH as below, which is significant for cushing's disease, DM, depression, HTN, morbid obesity, and OSA. Within the past few days she has had a complicated family situation at home, which has caused a lot of distress. Otherwise her health has been in its usual state. In the evening hours of 3/12 she contacted her husband while he was working to inform him she was going to overdose. He went home and she had already taken approximately #15 10mg  Baclofen and 100mg  trazadone. She then went on to take "at least #30" 50mg  tramadol. She would not initially come to the ED. Her husband called poison control and watched her. When she became encephalopathic and tremulous she agreed to come to ED. She suffered a generalized seizure in triage and was brought back to ED where she was emergently intubated. PCCM asked to admit.     SUBJECTIVE:  No issues post extubation Up to chair, comfortable Tolerated CPAP overnight last night (her machine) Chest x-ray today with some increased vascular congestion   VITAL SIGNS: BP 123/61   Pulse (!) 115   Temp 98.3 F (36.8 C) (Oral)   Resp (!) 29   Ht 4\' 11"  (1.499 m)   Wt (!) 139.9 kg (308 lb 6.8 oz)   SpO2 93%   BMI 62.29 kg/m   HEMODYNAMICS:    VENTILATOR SETTINGS: FiO2 (%):  [36 %] 36 %  INTAKE / OUTPUT: I/O last 3 completed shifts: In: 2572.3 [I.V.:2282.3; NG/GT:290] Out: 3385 [Urine:3385]  PHYSICAL EXAMINATION: General: Obese woman up to chair, comfortable, oxygen in place HEENT: Oropharynx clear, some hoarse voice, very large neck Neuro: Awake, alert, appropriate, answers all questions and follows instructions Cardiac: Regular, distant, no murmur PULM:  Decreased at both bases, no wheezing GI: Soft, nontender, positive bowel sounds Extremities: 1+ lower extremity edema Skin: No rash  LABS:  BMET Recent Labs  Lab 11/21/17 0543 11/22/17 0433 11/23/17 0350  NA 137 142 142  K 5.3* 3.9 4.0  CL 106 111 108  CO2 20* 22 22  BUN 24* 22* 20  CREATININE 1.23* 0.79 0.84  GLUCOSE 326* 241* 207*    Electrolytes Recent Labs  Lab 11/20/17 1819 11/21/17 0543 11/21/17 1752 11/22/17 0433 11/23/17 0350  CALCIUM 8.7* 9.0  --  9.0 9.7  MG 1.7 2.1 1.9  --  1.4*  PHOS 3.2 2.7 2.4*  --   --     CBC Recent Labs  Lab 11/20/17 1410 11/21/17 0543 11/23/17 0350  WBC 9.5 9.7 12.5*  HGB 12.6 13.5 13.1  HCT 39.3 41.1 41.3  PLT 338 322 344    Coag's No results for input(s): APTT, INR in the last 168 hours.  Sepsis Markers No results for input(s): LATICACIDVEN, PROCALCITON, O2SATVEN in the last 168 hours.  ABG Recent Labs  Lab 11/20/17 0302 11/20/17 0610 11/22/17 0257  PHART 7.140* 7.392 7.367  PCO2ART 52.0* 34.7 42.5  PO2ART 229.0* 115* 95.2    Liver Enzymes Recent Labs  Lab 11/20/17 0219 11/20/17 1410 11/20/17 1819  AST 33 46*  --   ALT 26 50  --   ALKPHOS 111 123  --   BILITOT 0.8 0.9  --   ALBUMIN 3.9 3.3* 3.3*  Cardiac Enzymes No results for input(s): TROPONINI, PROBNP in the last 168 hours.  Glucose Recent Labs  Lab 11/22/17 1146 11/22/17 1459 11/22/17 2008 11/22/17 2332 11/23/17 0404 11/23/17 0753  GLUCAP 307* 310* 260* 175* 186* 200*    Imaging Dg Chest Port 1 View  Result Date: 11/23/2017 CLINICAL DATA:  Respiratory failure EXAM: PORTABLE CHEST 1 VIEW COMPARISON:  11/22/2017 FINDINGS: Endotracheal and NG tubes removed. Worsening vascular congestion. No sign of interstitial edema. Cardiomegaly. No pneumothorax. No new consolidation. IMPRESSION: Extubated. Worsening vascular congestion without signs of interstitial edema. Electronically Signed   By: Marybelle Killings M.D.   On: 11/23/2017 08:53      STUDIES:  CT head 3/13 > unremarkable  CULTURES:   ANTIBIOTICS:   SIGNIFICANT EVENTS: 3/13 admit after intentional overdose of baclofen and tramadol.  3/15 weaning awake and alert, strong cough >>> looks ready for extubation  LINES/TUBES: ETT 3/13 >  DISCUSSION: 42 year old morbidly obese female with complex medical history. Overdosed intentionally intubated for airway protection.  ASSESSMENT / PLAN:  PULMONARY A: Inability to protect airway in the setting of intentional overdose OSA/OHS on home CPAP   P:   Push pulmonary hygiene Repeat empiric diuresis 3/16 given some vascular congestion on chest x-ray Continue nightly CPAP Follow chest x-ray intermittently  CARDIOVASCULAR A:  Hypotension - resolved  HTN P:  IV fluids discontinued Hydralazine as needed Empiric Lasix as above Continue telemetry  RENAL Hyperkalemia - resolved  AKI - improved  P:   KVO IVF  Repeat empiric diuresis on 3/16 given some vascular congestion on chest x-ray Follow BMP, urine output Avoid nephrotoxic medications Maintain renal perfusion MAP goal > 65  GASTROINTESTINAL A:   Gastric motility issues Takes reglan at home Vomited TF 3/14  P:   Hold tube feeds, n.p.o. post extubation until swallow evaluation PPI  HEMATOLOGIC No acute issues  P:  Continue heparin prophylaxis Follow CBC  INFECTIOUS A:   No evidence for active infection  P:   Following clinically   ENDOCRINE A:   DM Cushings Hypothyroid    P:   Sliding scale insulin and CBG as ordered Continue home Synthroid Lantus Appreciate assistance from diabetes coordinator  NEUROLOGIC A:   Acute toxic encephalopathy - secondary to overdose of baclofen (at least 150mg  estimated) and tramadol (at least 1500mg  estimated). Co-ingestants negative>> resolved Seizure: likely secondary to overdose. None further  Depression, intentional overdose Awake and alert 3/15 P:   [place] psychiatry  assistance.  They have recommended inpatient psychiatric hospitalization given her continued suicidal ideation.  We are to continue bedside sitter.  No psychotropic medications until stable medically Holding home baclofen, tramadol, venlafaxine   FAMILY  - Updates: 3/16 updated patient and husband at [place] family meet or Palliative Care meeting due by:  3/20   Baltazar Apo, MD, PhD 11/23/2017, 12:13 PM [facility] [phone] or if no answer [phone]

## 2017-11-23 NOTE — Progress Notes (Signed)
Patient placed on home cpap herself and doing well. 2l o2 bleed in.

## 2017-11-23 NOTE — Progress Notes (Signed)
1945:  Questran given in OJ along with Tylenol 650mg  for headache.  Husband left to go home, patient being transferred to 49.  Report given to 5W RN.  VSS

## 2017-11-24 LAB — CBC
HCT: 42.3 % (ref 36.0–46.0)
HEMOGLOBIN: 13.5 g/dL (ref 12.0–15.0)
MCH: 28.7 pg (ref 26.0–34.0)
MCHC: 31.9 g/dL (ref 30.0–36.0)
MCV: 89.8 fL (ref 78.0–100.0)
PLATELETS: 310 10*3/uL (ref 150–400)
RBC: 4.71 MIL/uL (ref 3.87–5.11)
RDW: 12.9 % (ref 11.5–15.5)
WBC: 12.4 10*3/uL — AB (ref 4.0–10.5)

## 2017-11-24 LAB — BASIC METABOLIC PANEL
ANION GAP: 14 (ref 5–15)
BUN: 25 mg/dL — ABNORMAL HIGH (ref 6–20)
CALCIUM: 9.4 mg/dL (ref 8.9–10.3)
CO2: 23 mmol/L (ref 22–32)
CREATININE: 1 mg/dL (ref 0.44–1.00)
Chloride: 103 mmol/L (ref 101–111)
Glucose, Bld: 225 mg/dL — ABNORMAL HIGH (ref 65–99)
Potassium: 4 mmol/L (ref 3.5–5.1)
SODIUM: 140 mmol/L (ref 135–145)

## 2017-11-24 LAB — MAGNESIUM: MAGNESIUM: 1.6 mg/dL — AB (ref 1.7–2.4)

## 2017-11-24 LAB — GLUCOSE, CAPILLARY
GLUCOSE-CAPILLARY: 341 mg/dL — AB (ref 65–99)
Glucose-Capillary: 235 mg/dL — ABNORMAL HIGH (ref 65–99)
Glucose-Capillary: 260 mg/dL — ABNORMAL HIGH (ref 65–99)
Glucose-Capillary: 190 mg/dL — ABNORMAL HIGH (ref 65–99)

## 2017-11-24 MED ORDER — MENTHOL 3 MG MT LOZG
1.0000 | LOZENGE | OROMUCOSAL | Status: DC | PRN
  Administered 2017-11-24 – 2017-11-27 (×5): 3 mg via ORAL
  Filled 2017-11-24 (×3): qty 9

## 2017-11-24 MED ORDER — FUROSEMIDE 10 MG/ML IJ SOLN
40.0000 mg | Freq: Every day | INTRAMUSCULAR | Status: AC
  Administered 2017-11-24 – 2017-11-25 (×2): 40 mg via INTRAVENOUS
  Filled 2017-11-24 (×3): qty 4

## 2017-11-24 MED ORDER — PANTOPRAZOLE SODIUM 40 MG PO TBEC
40.0000 mg | DELAYED_RELEASE_TABLET | Freq: Every day | ORAL | Status: DC
  Administered 2017-11-24 – 2017-11-26 (×3): 40 mg via ORAL
  Filled 2017-11-24 (×3): qty 1

## 2017-11-24 MED ORDER — MAGNESIUM SULFATE 2 GM/50ML IV SOLN
2.0000 g | Freq: Once | INTRAVENOUS | Status: AC
  Administered 2017-11-24: 2 g via INTRAVENOUS
  Filled 2017-11-24: qty 50

## 2017-11-24 MED ORDER — ENOXAPARIN SODIUM 60 MG/0.6ML ~~LOC~~ SOLN
60.0000 mg | SUBCUTANEOUS | Status: DC
  Administered 2017-11-24 – 2017-12-06 (×13): 60 mg via SUBCUTANEOUS
  Filled 2017-11-24 (×14): qty 0.6

## 2017-11-24 MED ORDER — ACETAMINOPHEN 325 MG PO TABS
650.0000 mg | ORAL_TABLET | Freq: Four times a day (QID) | ORAL | Status: DC | PRN
  Administered 2017-11-24 – 2018-01-06 (×11): 650 mg via ORAL
  Filled 2017-11-24 (×11): qty 2

## 2017-11-24 NOTE — Progress Notes (Signed)
PROGRESS NOTE    [name]  NID:[id number] DOB: [birth year] DOA: 11/20/2017 PCP: Sandi Mariscal, MD   Brief Narrative: 42 year old morbidly obese female with history of Cushing's disease, diabetes on insulin, depression, hypertension, OSA who was admitted on 3/13 to ICU for intestinal drug overdose.  Patient took extra pills of baclofen, trazodone and tramadol.  Patient became encephalopathic and had generalized seizure in ER therefore patient was intubated and admitted by pulmonary critical care.  Patient was evaluated by psychiatrist who recommended inpatient psych on discharge.  Started on diuretics for pulmonary congestion.  Transferred to [facility] on 11/24/2017.  Assessment & Plan:  #Intentional overdose with suicidal ideation: Patient is now extubated.  Mood is stable.  Seen by psychiatrist.  Recommended inpatient psych on discharge.  Continue bed sitter.  Holding psychotropic agent.  Social worker consulted for discharge planning.  #Acute toxic encephalopathy with inability to protect airway/in the setting of intentional overdose requiring intubation.  Currently extubated.  Chest x-ray with vascular congestion/pulmonary edema therefore on IV Lasix.  Monitor labs. -Mental status improved.  Continue to monitor.  #Hypotension improved now.  Patient has history of hypertension.  She is on Lasix.  #Acute kidney injury and hyperkalemia: Improved now.  #Hypomagnesemia: Replete magnesium.  Monitor labs.  #Seizure disorder in the setting of overdose.  Close observation.  #Anxiety depression: Psych follow-up.  #Chronic diabetes with hyperglycemia in obese patient: Continue insulin.  Monitor blood sugar level.  DVT prophylaxis: Lovenox subcutaneous Code Status: Full code Family Communication: No family at bedside Disposition Plan: Inpatient behavioral center when bed is available.  Social worker consulted    Consultants:   Psychiatric  Admitted by pulmonary critical care  service  Procedures: Mechanical ventilation Antimicrobials: None  Subjective: Seen and examined at bedside.  Patient was sitting on chair comfortable.  Denied headache, dizziness, nausea vomiting.  No chest pain or cough.  Objective: Vitals:   11/23/17 2046 11/23/17 2146 11/24/17 0458 11/24/17 0919  BP: (!) 116/55  120/67   Pulse: (!) 112 (!) 121 (!) 101   Resp: 18 20 18    Temp: 97.7 F (36.5 C)  98.5 F (36.9 C)   TempSrc: Oral  Oral   SpO2: 97% 98% 93%   Weight:    (!) 139 kg (306 lb 8 oz)  Height:        Intake/Output Summary (Last 24 hours) at 11/24/2017 1151 Last data filed at 11/23/2017 1921 Gross per 24 hour  Intake 972 ml  Output -  Net 972 ml   Filed Weights   11/22/17 0416 11/23/17 0428 11/24/17 0919  Weight: (!) 143.7 kg (316 lb 12.8 oz) (!) 139.9 kg (308 lb 6.8 oz) (!) 139 kg (306 lb 8 oz)    Examination:  General exam: Morbidly obese cushingoid looking female sitting on chair Respiratory system: Bibasal decreased breath sound, respiratory for normal Cardiovascular system: S1 & S2 heard, RRR.  No pedal edema. Gastrointestinal system: Abdomen is nondistended, soft and nontender. Normal bowel sounds heard. Central nervous system: Alert and oriented. No focal neurological deficits. Extremities: Symmetric 5 x 5 power. Skin: No rashes, lesions or ulcers Psychiatry: Denied suicidal or homicidal ideation.  Reported her mood is good today.    Data Reviewed: I have personally reviewed following labs and imaging studies  CBC: Recent Labs  Lab 11/20/17 0219 11/20/17 1410 11/21/17 0543 11/23/17 0350 11/24/17 0448  WBC 13.3* 9.5 9.7 12.5* 12.4*  NEUTROABS  --   --  9.1*  --   --  HGB 14.2 12.6 13.5 13.1 13.5  HCT 42.5 39.3 41.1 41.3 42.3  MCV 89.9 88.9 89.7 91.0 89.8  PLT 402* 338 322 344 149   Basic Metabolic Panel: Recent Labs  Lab 11/20/17 1410 11/20/17 1819 11/21/17 0543 11/21/17 1752 11/22/17 0433 11/23/17 0350 11/24/17 0448  NA  --  138  137  --  142 142 140  K  --  4.7 5.3*  --  3.9 4.0 4.0  CL  --  108 106  --  111 108 103  CO2  --  19* 20*  --  22 22 23   GLUCOSE  --  211* 326*  --  241* 207* 225*  BUN  --  24* 24*  --  22* 20 25*  CREATININE  --  1.57* 1.23*  --  0.79 0.84 1.00  CALCIUM  --  8.7* 9.0  --  9.0 9.7 9.4  MG 1.9 1.7 2.1 1.9  --  1.4* 1.6*  PHOS 3.9 3.2 2.7 2.4*  --   --   --    GFR: Estimated Creatinine Clearance: 94.3 mL/min (by C-G formula based on SCr of 1 mg/dL). Liver Function Tests: Recent Labs  Lab 11/20/17 0219 11/20/17 1410 11/20/17 1819  AST 33 46*  --   ALT 26 50  --   ALKPHOS 111 123  --   BILITOT 0.8 0.9  --   PROT 7.4 6.3*  --   ALBUMIN 3.9 3.3* 3.3*   No results for input(s): LIPASE, AMYLASE in the last 168 hours. No results for input(s): AMMONIA in the last 168 hours. Coagulation Profile: No results for input(s): INR, PROTIME in the last 168 hours. Cardiac Enzymes: Recent Labs  Lab 11/20/17 0219  CKTOTAL 53   BNP (last 3 results) No results for input(s): PROBNP in the last 8760 hours. HbA1C: No results for input(s): HGBA1C in the last 72 hours. CBG: Recent Labs  Lab 11/23/17 0753 11/23/17 1226 11/23/17 1850 11/23/17 2251 11/24/17 0753  GLUCAP 200* 300* 282* 222* 235*   Lipid Profile: No results for input(s): CHOL, HDL, LDLCALC, TRIG, CHOLHDL, LDLDIRECT in the last 72 hours. Thyroid Function Tests: No results for input(s): TSH, T4TOTAL, FREET4, T3FREE, THYROIDAB in the last 72 hours. Anemia Panel: No results for input(s): VITAMINB12, FOLATE, FERRITIN, TIBC, IRON, RETICCTPCT in the last 72 hours. Sepsis Labs: No results for input(s): PROCALCITON, LATICACIDVEN in the last 168 hours.  Recent Results (from the past 240 hour(s))  MRSA PCR Screening     Status: None   Collection Time: 11/20/17  5:13 AM  Result Value Ref Range Status   MRSA by PCR NEGATIVE NEGATIVE Final    Comment:        The GeneXpert MRSA Assay (FDA approved for NASAL specimens only), is  one component of a comprehensive MRSA colonization surveillance program. It is not intended to diagnose MRSA infection nor to guide or monitor treatment for MRSA infections. Performed at [facility], [place] [address], [place], [place] [address]          Radiology Studies: Dg Chest Port 1 View  Result Date: 11/23/2017 CLINICAL DATA:  Respiratory failure EXAM: PORTABLE CHEST 1 VIEW COMPARISON:  11/22/2017 FINDINGS: Endotracheal and NG tubes removed. Worsening vascular congestion. No sign of interstitial edema. Cardiomegaly. No pneumothorax. No new consolidation. IMPRESSION: Extubated. Worsening vascular congestion without signs of interstitial edema. Electronically Signed   By: Marybelle Killings M.D.   On: 11/23/2017 08:53        Scheduled  Meds: . cholestyramine  4 g Oral QODAY  . furosemide  40 mg Intravenous Daily  . heparin  5,000 Units Subcutaneous Q8H  . insulin aspart  0-20 Units Subcutaneous TID WC  . insulin aspart  0-5 Units Subcutaneous QHS  . insulin aspart  4 Units Subcutaneous TID WC  . insulin glargine  28 Units Subcutaneous QHS  . levothyroxine  75 mcg Oral QAC breakfast  . pantoprazole  40 mg Oral Daily   Continuous Infusions: . sodium chloride Stopped (11/22/17 1352)  . magnesium sulfate 1 - 4 g bolus IVPB       LOS: 4 days    Giorgio Chabot Tanna Furry, MD [facility] Pager [phone]  If 7PM-7AM, please contact night-coverage [email or web address] Password [facility] 11/24/2017, 11:51 AM

## 2017-11-24 NOTE — Progress Notes (Signed)
Inpatient Diabetes Program Recommendations  AACE/ADA: New Consensus Statement on Inpatient Glycemic Control (2015)  Target Ranges:  Prepandial:   less than 140 mg/dL      Peak postprandial:   less than 180 mg/dL (1-2 hours)      Critically ill patients:  140 - 180 mg/dL   Lab Results  Component Value Date   GLUCAP 235 (H) 11/24/2017   HGBA1C 7.2 (H) 11/20/2017    Review of Glycemic Control Results for Rickert, [name] (MRN [id number]) as of 11/24/2017 10:37  Ref. Range 11/23/2017 07:53 11/23/2017 12:26 11/23/2017 18:50 11/23/2017 22:51 11/24/2017 07:53  Glucose-Capillary Latest Ref Range: 65 - 99 mg/dL 200 (H) 300 (H) 282 (H) 222 (H) 235 (H)   Diabetes history: Type 2 Outpatient Diabetes medications: 70/30 28 U BID, Metformin, Actos Current orders for Inpatient glycemic control: lantus 28 units + Novolog meal coverage 4 units tid Novolog Resistant scale tid + hs  Inpatient Diabetes Program Recommendations:   -increase Lantus to 32 units daily -Increase Novolog meal coverage to 6 units tid if eats 50%  Thank you, Bethena Roys E. Soleil Mas, RN, MSN, CDE  Diabetes Coordinator Inpatient Glycemic Control Team Team Pager [phone] (8am-5pm) 11/24/2017 10:40 AM

## 2017-11-25 ENCOUNTER — Inpatient Hospital Stay (HOSPITAL_COMMUNITY): Payer: Medicaid Other

## 2017-11-25 DIAGNOSIS — R0602 Shortness of breath: Secondary | ICD-10-CM

## 2017-11-25 LAB — BASIC METABOLIC PANEL
Anion gap: 13 (ref 5–15)
BUN: 25 mg/dL — ABNORMAL HIGH (ref 6–20)
CALCIUM: 9.3 mg/dL (ref 8.9–10.3)
CO2: 24 mmol/L (ref 22–32)
CREATININE: 0.87 mg/dL (ref 0.44–1.00)
Chloride: 101 mmol/L (ref 101–111)
GFR calc Af Amer: >60 mL/min (ref >60)
GFR calc non Af Amer: >60 mL/min (ref >60)
GLUCOSE: 290 mg/dL — AB (ref 65–99)
Potassium: 3.9 mmol/L (ref 3.5–5.1)
Sodium: 138 mmol/L (ref 135–145)

## 2017-11-25 LAB — TROPONIN I

## 2017-11-25 LAB — GLUCOSE, CAPILLARY
GLUCOSE-CAPILLARY: 286 mg/dL — AB (ref 65–99)
GLUCOSE-CAPILLARY: 250 mg/dL — AB (ref 65–99)
GLUCOSE-CAPILLARY: 273 mg/dL — AB (ref 65–99)
Glucose-Capillary: 259 mg/dL — ABNORMAL HIGH (ref 65–99)

## 2017-11-25 LAB — MAGNESIUM: Magnesium: 1.7 mg/dL (ref 1.7–2.4)

## 2017-11-25 LAB — D-DIMER, QUANTITATIVE: D-Dimer, Quant: 0.31 ug/mL-FEU (ref 0.00–0.50)

## 2017-11-25 MED ORDER — BUPROPION HCL 100 MG PO TABS
100.0000 mg | ORAL_TABLET | Freq: Two times a day (BID) | ORAL | Status: DC
  Administered 2017-11-25 – 2017-12-15 (×24): 100 mg via ORAL
  Filled 2017-11-25 (×43): qty 1

## 2017-11-25 MED ORDER — DEXAMETHASONE SODIUM PHOSPHATE 10 MG/ML IJ SOLN
10.0000 mg | Freq: Once | INTRAMUSCULAR | Status: AC
  Administered 2017-11-26: 10 mg via INTRAVENOUS
  Filled 2017-11-25: qty 1

## 2017-11-25 MED ORDER — IPRATROPIUM-ALBUTEROL 0.5-2.5 (3) MG/3ML IN SOLN
3.0000 mL | Freq: Four times a day (QID) | RESPIRATORY_TRACT | Status: DC
  Administered 2017-11-26 – 2017-11-27 (×7): 3 mL via RESPIRATORY_TRACT
  Filled 2017-11-25 (×8): qty 3

## 2017-11-25 MED ORDER — HYDROXYZINE HCL 25 MG PO TABS
25.0000 mg | ORAL_TABLET | Freq: Three times a day (TID) | ORAL | Status: DC | PRN
  Administered 2017-11-25: 25 mg via ORAL
  Filled 2017-11-25: qty 1

## 2017-11-25 MED ORDER — IPRATROPIUM-ALBUTEROL 0.5-2.5 (3) MG/3ML IN SOLN
3.0000 mL | Freq: Four times a day (QID) | RESPIRATORY_TRACT | Status: DC
  Administered 2017-11-25 (×3): 3 mL via RESPIRATORY_TRACT
  Filled 2017-11-25 (×3): qty 3

## 2017-11-25 MED ORDER — RACEPINEPHRINE HCL 2.25 % IN NEBU
0.5000 mL | INHALATION_SOLUTION | Freq: Once | RESPIRATORY_TRACT | Status: AC
  Administered 2017-11-26: 0.5 mL via RESPIRATORY_TRACT
  Filled 2017-11-25: qty 0.5

## 2017-11-25 MED ORDER — PREMIER PROTEIN SHAKE
11.0000 [oz_av] | Freq: Two times a day (BID) | ORAL | Status: DC
  Administered 2017-11-27 – 2017-12-09 (×2): 11 [oz_av] via ORAL
  Filled 2017-11-25 (×40): qty 325.31

## 2017-11-25 MED ORDER — MAGNESIUM SULFATE 2 GM/50ML IV SOLN
2.0000 g | Freq: Once | INTRAVENOUS | Status: AC
  Administered 2017-11-25: 2 g via INTRAVENOUS
  Filled 2017-11-25: qty 50

## 2017-11-25 MED ORDER — GABAPENTIN 300 MG PO CAPS
300.0000 mg | ORAL_CAPSULE | Freq: Two times a day (BID) | ORAL | Status: DC
  Administered 2017-11-25 – 2017-12-15 (×25): 300 mg via ORAL
  Filled 2017-11-25 (×28): qty 1

## 2017-11-25 MED ORDER — TRAZODONE HCL 50 MG PO TABS
50.0000 mg | ORAL_TABLET | Freq: Every evening | ORAL | Status: DC | PRN
  Administered 2017-11-25 – 2018-01-06 (×10): 50 mg via ORAL
  Filled 2017-11-25 (×11): qty 1

## 2017-11-25 NOTE — Progress Notes (Signed)
PROGRESS NOTE    [name]  OZD:[id number] DOB: 1976/05/26 DOA: 11/20/2017 PCP: Sandi Mariscal, MD   Brief Narrative: 42 year old morbidly obese female with history of Cushing's disease, diabetes on insulin, depression, hypertension, OSA who was admitted on 3/13 to ICU for intestinal drug overdose.  Patient took extra pills of baclofen, trazodone and tramadol.  Patient became encephalopathic and had generalized seizure in ER therefore patient was intubated and admitted by pulmonary critical care.  Patient was evaluated by psychiatrist who recommended inpatient psych on discharge.  Started on diuretics for pulmonary congestion.  Transferred to [facility] on 11/24/2017.  Assessment & Plan:  #Intentional overdose with suicidal ideation: Patient is now extubated.  Seen by psychiatrist.  Recommended inpatient psych on discharge.  Continue bed sitter.   -Patient looked more anxious today.  I resume Neurontin and Wellbutrin.  Psychiatry was reconsulted for the evaluation of psych medication.  Continue supportive care. -Discussed with the social worker for safe discharge to inpatient behavioral center.  #Acute toxic encephalopathy with inability to protect airway/in the setting of intentional overdose requiring intubation.  Currently extubated.   -Mental status improved.  Continue to monitor.  #Acute pulmonary edema: Currently on IV Lasix.  Repeat chest x-ray with cardiomegaly.  #Shortness of breath and chest pain: Patient reported having shortness of breath.  Chest auscultation with distant breath sound.  Repeat chest x-ray today with cardiomegaly with no other acute finding.  Continue IV Lasix.  I will order EKG and d-dimer.  Patient symptoms may be contributed by anxiety and not receiving her psych medication.  DuoNeb nebulization ordered.  Discussed with the nurse.  Continue to follow-up.  #Hypotension improved now.  Patient has history of hypertension.  She is on Lasix.  #Acute kidney injury and  hyperkalemia: Improved now.  #Hypomagnesemia: Replete magnesium.  Monitor labs.  #Seizure disorder in the setting of overdose.  Close observation.  #Anxiety depression: Psych follow-up.  #Chronic diabetes with hyperglycemia in obese patient: Continue insulin.  Monitor blood sugar level.  DVT prophylaxis: Lovenox subcutaneous Code Status: Full code Family Communication: Discussed with the multiple family members regarding father mother and husband yesterday.  I will discussed with the family member again today. Disposition Plan: Inpatient behavioral center when bed is available.  Social worker consulted    Consultants:   Psychiatric  Admitted by pulmonary critical care service  Procedures: Mechanical ventilation Antimicrobials: None  Subjective: Seen and examined at bedside.  Patient reported shortness of breath and later on she reported chest discomfort to the nurse.  Denied headache, dizziness, nausea vomiting.  Reported mood is good.  Objective: Vitals:   11/24/17 1415 11/24/17 2100 11/25/17 0542 11/25/17 1042  BP: 124/74 117/60 137/80 137/62  Pulse: (!) 107 (!) 111 100 (!) 103  Resp: 20 18 20  (!) 26  Temp: 98.1 F (36.7 C) 98 F (36.7 C) 98.1 F (36.7 C)   TempSrc: Oral Oral Oral   SpO2: 96% 96% 98% 96%  Weight:      Height:        Intake/Output Summary (Last 24 hours) at 11/25/2017 1058 Last data filed at 11/24/2017 1355 Gross per 24 hour  Intake 305 ml  Output -  Net 305 ml   Filed Weights   11/22/17 0416 11/23/17 0428 11/24/17 0919  Weight: (!) 143.7 kg (316 lb 12.8 oz) (!) 139.9 kg (308 lb 6.8 oz) (!) 139 kg (306 lb 8 oz)    Examination:  General exam: Morbidly obese cushingoid looking female sitting on. Respiratory  system: Bilateral distant breath sound, respiratory rate mildly increased, no wheezing Cardiovascular system: Regular rate rhythm S1-S2 normal.  No pedal edema. Gastrointestinal system: Abdomen soft, nontender.  Bowel sounds  positive Central nervous system: Alert awake and oriented. Skin: No rashes, lesions or ulcers Psychiatry: Denies suicidal or homicidal ideation.    Data Reviewed: I have personally reviewed following labs and imaging studies  CBC: Recent Labs  Lab 11/20/17 0219 11/20/17 1410 11/21/17 0543 11/23/17 0350 11/24/17 0448  WBC 13.3* 9.5 9.7 12.5* 12.4*  NEUTROABS  --   --  9.1*  --   --   HGB 14.2 12.6 13.5 13.1 13.5  HCT 42.5 39.3 41.1 41.3 42.3  MCV 89.9 88.9 89.7 91.0 89.8  PLT 402* 338 322 344 921   Basic Metabolic Panel: Recent Labs  Lab 11/20/17 1410 11/20/17 1819 11/21/17 0543 11/21/17 1752 11/22/17 0433 11/23/17 0350 11/24/17 0448 11/25/17 0733  NA  --  138 137  --  142 142 140 138  K  --  4.7 5.3*  --  3.9 4.0 4.0 3.9  CL  --  108 106  --  111 108 103 101  CO2  --  19* 20*  --  22 22 23 24   GLUCOSE  --  211* 326*  --  241* 207* 225* 290*  BUN  --  24* 24*  --  22* 20 25* 25*  CREATININE  --  1.57* 1.23*  --  0.79 0.84 1.00 0.87  CALCIUM  --  8.7* 9.0  --  9.0 9.7 9.4 9.3  MG 1.9 1.7 2.1 1.9  --  1.4* 1.6* 1.7  PHOS 3.9 3.2 2.7 2.4*  --   --   --   --    GFR: Estimated Creatinine Clearance: 108.4 mL/min (by C-G formula based on SCr of 0.87 mg/dL). Liver Function Tests: Recent Labs  Lab 11/20/17 0219 11/20/17 1410 11/20/17 1819  AST 33 46*  --   ALT 26 50  --   ALKPHOS 111 123  --   BILITOT 0.8 0.9  --   PROT 7.4 6.3*  --   ALBUMIN 3.9 3.3* 3.3*   No results for input(s): LIPASE, AMYLASE in the last 168 hours. No results for input(s): AMMONIA in the last 168 hours. Coagulation Profile: No results for input(s): INR, PROTIME in the last 168 hours. Cardiac Enzymes: Recent Labs  Lab 11/20/17 0219  CKTOTAL 53   BNP (last 3 results) No results for input(s): PROBNP in the last 8760 hours. HbA1C: No results for input(s): HGBA1C in the last 72 hours. CBG: Recent Labs  Lab 11/24/17 0753 11/24/17 1300 11/24/17 1703 11/24/17 2135 11/25/17 0839   GLUCAP 235* 341* 260* 190* 259*   Lipid Profile: No results for input(s): CHOL, HDL, LDLCALC, TRIG, CHOLHDL, LDLDIRECT in the last 72 hours. Thyroid Function Tests: No results for input(s): TSH, T4TOTAL, FREET4, T3FREE, THYROIDAB in the last 72 hours. Anemia Panel: No results for input(s): VITAMINB12, FOLATE, FERRITIN, TIBC, IRON, RETICCTPCT in the last 72 hours. Sepsis Labs: No results for input(s): PROCALCITON, LATICACIDVEN in the last 168 hours.  Recent Results (from the past 240 hour(s))  MRSA PCR Screening     Status: None   Collection Time: 11/20/17  5:13 AM  Result Value Ref Range Status   MRSA by PCR NEGATIVE NEGATIVE Final    Comment:        The GeneXpert MRSA Assay (FDA approved for NASAL specimens only), is one component of a comprehensive MRSA colonization  surveillance program. It is not intended to diagnose MRSA infection nor to guide or monitor treatment for MRSA infections. Performed at [facility], [place] [address] [place], [place] [address]          Radiology Studies: Dg Chest Port 1 View  Result Date: 11/25/2017 CLINICAL DATA:  Shortness of breath. Decreased breath sounds in the lung bases bilaterally. EXAM: PORTABLE CHEST 1 VIEW COMPARISON:  Single-view of the chest 11/23/2017 and 11/20/2017. PA and lateral chest 12/12/2014. FINDINGS: There is cardiomegaly without edema. The lungs are clear. No pneumothorax or pleural fluid. No acute bony abnormality. IMPRESSION: Cardiomegaly without acute disease. Electronically Signed   By: Inge Rise M.D.   On: 11/25/2017 09:28        Scheduled Meds: . buPROPion  100 mg Oral BID  . cholestyramine  4 g Oral QODAY  . enoxaparin (LOVENOX) injection  60 mg Subcutaneous Q24H  . gabapentin  300 mg Oral BID  . insulin aspart  0-20 Units Subcutaneous TID WC  . insulin aspart  0-5 Units Subcutaneous QHS  . insulin aspart  4 Units Subcutaneous TID WC  . insulin glargine  28 Units Subcutaneous QHS  .  ipratropium-albuterol  3 mL Nebulization Q6H  . levothyroxine  75 mcg Oral QAC breakfast  . pantoprazole  40 mg Oral Daily   Continuous Infusions: . sodium chloride Stopped (11/22/17 1352)     LOS: 5 days    Naz Denunzio Tanna Furry, MD [facility] Pager [phone]  If 7PM-7AM, please contact night-coverage [email or web address] Password [facility] 11/25/2017, 10:58 AM

## 2017-11-25 NOTE — Plan of Care (Signed)
Progressing

## 2017-11-25 NOTE — Progress Notes (Signed)
Pt. Having new co of chest pain. VSS with the exception of mild tachycardia and tachypnea. 12 lead EKG done. Placed in chart. MD notified. Will continue to monitor.

## 2017-11-25 NOTE — Progress Notes (Signed)
Pt family (spouse) requesting to speak with CSW regarding psych placement when stable.  CSW met with pt and pt spouse at bedside and answered questions.  Explained that referral process could not be started until patient considered medically stable for DC- also explained that we cannot predict how long her stay will be since that will be determined by her psychiatrist during treatment.  CSW will continue to follow and assist with DC to inpatient psych facility when stable- per MD anticipate tomorrow.  Jorge Ny, LCSW Clinical Social Worker [phone]

## 2017-11-25 NOTE — Progress Notes (Signed)
Nutrition Follow-up  DOCUMENTATION CODES:   Morbid obesity  INTERVENTION:  Premier Protein BID, each supplement provides 160 calories and 30 grams of protein  NUTRITION DIAGNOSIS:   Increased nutrient needs related to acute illness as evidenced by estimated needs. -new dx  GOAL:   Patient will meet greater than or equal to 90% of their needs -ongoing  MONITOR:   PO intake, I & O's, Weight trends, Labs, Supplement acceptance  ASSESSMENT:   42 yo female admitted with acute toxic encephalopathy secondary to intentional OD on baclofan, trazadone and  tramadol, seizures. Pt intubate for airway protection. Pt with hx of Cushing's, DM, OSA  3/15 - Extubated PO Averaging 59%, ate half of tuna salad, and ice cream for lunch. Will need inpt psych following discharge per psych  Labs reviewed:  CBGs 286, 259, 190 Medications reviewed and include:  Insulin  Diet Order:  Diet Carb Modified Fluid consistency: Thin; Room service appropriate? Yes  EDUCATION NEEDS:   Not appropriate for education at this time  Skin:  Skin Assessment: Skin Integrity Issues: Skin Integrity Issues:: Other (Comment) Other: MASD: abdomen, bilateral groin, under breasts  Last BM:  3/12  Height:   Ht Readings from Last 1 Encounters:  11/22/17 4\' 11"  (1.499 m)    Weight:   Wt Readings from Last 1 Encounters:  11/25/17 (!) 305 lb 12.5 oz (138.7 kg)    Ideal Body Weight:  52.3 kg  BMI:  Body mass index is 61.76 kg/m.  Estimated Nutritional Needs:   Kcal:  3086-5784 kcals   Protein:  131 g   Fluid:  >/= 1.8 L   Satira Anis. Colsen Modi, MS, RD LDN Inpatient Clinical Dietitian Pager [phone]

## 2017-11-25 NOTE — Progress Notes (Signed)
Called by RT Sharyn Lull for patient with stridor for additional assessement. Upon arrival, patient sitting upright on the side of the bed.  Audilble inspiratory and expiratory stridor from the doorway.  All upper airway stridor.  No auscultated wheezing.  Pt states she also has trouble swallowing.  States it is sore and burns.  RR 20 with RA sats 98-100%.  Large short neck which is red and warm to the touch.  Temp is normal.  Notified C. Bodenheimer of pt status.  Orders received for Decadron 10 mg IV, racemic epinephrine tx, and full face Bipap if needed/tolerated.  Contracted with RN for care and orders.  Will continue to monitor.  Michelle RT at bedside to give racemic epi.

## 2017-11-25 NOTE — Consult Note (Signed)
[name] Psych Consult Progress Note  11/25/2017 1:16 PM [name]  MRN:  [id number] Subjective:   [name] was last seen on 3/15 for suicide attempt by overdose on Baclofen, Trazodone and Tramadol. She was recommended for inpatient psychiatric hospitalization. Wellbutrin 100 mg BID and Gabapentin 300 mg BID were restarted.   On interview, [name] reports that she is doing okay. She feels like hospitalization will be beneficial so that she can talk to people that have similar problems. She reports generalized anxiety. She denies SI today. She denies HI or AVH. She reports difficulty sleeping due to not having Trazodone. She denies problems with appetite. She believes that she has lost 25 pounds since hospitalization.   Principal Problem: Severe episode of recurrent major depressive disorder, without psychotic features ([place]) Diagnosis:   Patient Active Problem List   Diagnosis Date Noted  . SOB (shortness of breath) [R06.02]   . Severe episode of recurrent major depressive disorder, without psychotic features ([place]) [F33.2]   . Intentional overdose of drug in tablet form ([place]) [T50.902A] 11/20/2017  . Intentional drug overdose ([place]) [T50.902A]   . Seizure ([place]) [R56.9]   . Acute respiratory failure with hypercapnia ([place]) [J96.02]   . Status epilepticus ([place]) [G40.901]   . 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 Abdominal pain, epigastric [R10.13]   . LUQ abdominal pain [R10.12]   . Nausea and vomiting [R11.2]   . Gastroparesis [K31.84]   . Hypertriglyceridemia [E78.1] 07/22/2015  . Diabetes mellitus due to underlying condition, uncontrolled, with diabetic neuropathy, with long-term current use of insulin (HCC) [E08.40, E08.65, Z79.4]   . Vitamin D deficiency [E55.9] 07/19/2015  . 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 Osteoporosis [M81.0] 07/19/2015  . 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 Fracture, tibial plateau [S82.143A] 07/16/2015  . Uncontrolled diabetes mellitus with diabetic neuropathy, with long-term current use of insulin ([place]) [E11.40, Z79.4, E11.65] 07/16/2015  . Leukocytosis [D72.829] 07/16/2015  . Essential hypertension [I10] 07/16/2015  . Morbid obesity ([place]) [E66.01] 04/20/2014  . Hyperlipidemia [E78.5] 07/31/2012  . Cushing disease ([place]) [E24.0] 07/31/2012  . Hypothyroid [E03.9] 07/31/2012  . Major depressive disorder, recurrent episode, mild ([place]) [F33.0] 04/08/2012   Total Time spent with patient: 15 minutes  Past Psychiatric History: MDD  Past Medical History:  Past Medical History:  Diagnosis Date  . Anxiety   . Complication of anesthesia    Pt. states takes long time to wake up from it.   . Cushing's disease ([place])   . Depression   . Diabetes ([place])   . Hyperlipidemia   . Hyperlipidemia   . Hypertension   . Morbid obesity ([place])   . Osteoporosis 07/19/2015  . Periprosthetic fracture 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[place]), R tibial plateau  07/18/2015  . Sleep apnea   . Uncontrolled diabetes mellitus with diabetic neuropathy, with long-term current use of insulin ([place]) 07/16/2015  . Vitamin D deficiency 07/19/2015    Past Surgical History:  Procedure Laterality Date  . ANTERIOR TALOFIBULAR LIGAMENT REPAIR Left 11/15/2014   Procedure: ANTERIOR TALOFIBULAR LIGAMENT REPAIR;  Surgeon: Jana Half, DPM;  Location: [facility];  Service: Podiatry;  Laterality: Left;  . CESAREAN SECTION  dec 1997/  06-03-2001/   01-01-2005   BILATERAL TUBAL LIGATION WITH LAST ONE  . DILATION AND CURETTAGE OF UTERUS  1995   WITH SUCTION  . ESOPHAGOGASTRODUODENOSCOPY (EGD) WITH  PROPOFOL N/A 10/04/2016   Procedure: ESOPHAGOGASTRODUODENOSCOPY (EGD) WITH PROPOFOL;  Surgeon: [name], MD;  Location: [facility];  Service: Endoscopy;  Laterality: N/A;  . LAPAROSCOPIC  CHOLECYSTECTOMY  09-25-2005  . ORIF TIBIA PLATEAU Right 07/19/2015   Procedure: OPEN REDUCTION INTERNAL FIXATION (ORIF) RIGHT TIBIAL PLATEAU;  Surgeon: Altamese Silverton, MD;  Location: [facility];  Service: Orthopedics;  Laterality: Right;  . PARTIAL KNEE ARTHROPLASTY Right 04/19/2014   Procedure: RIGHT UNI KNEE ARTHROPLASTY MEDIALLY ;  Surgeon: Mauri Pole, MD;  Location: [facility];  Service: Orthopedics;  Laterality: Right;  . TUBAL LIGATION     Family History:  Family History  Adopted: Yes  Problem Relation Age of Onset  . Autism Son   . ADD / ADHD Son   . Apraxia Son    Family Psychiatric  History: Son-autism and ADHD.  Social History:  Social History   Substance and Sexual Activity  Alcohol Use Yes   Comment: RARE     Social History   Substance and Sexual Activity  Drug Use No    Social History   Socioeconomic History  . Marital status: Married    Spouse name: [name]  . Number of children: 2  . Years of education: None  . Highest education level: None  Social Needs  . Financial resource strain: None  . Food insecurity - worry: None  . Food insecurity - inability: None  . Transportation needs - medical: None  . Transportation needs - non-medical: None  Occupational History  . None  Tobacco Use  . Smoking status: Current Every Day Smoker    Years: 4.00    Types: Cigarettes  . Smokeless tobacco: Never Used  Substance and Sexual Activity  . Alcohol use: Yes    Comment: RARE  . Drug use: No  . Sexual activity: Yes    Partners: Male    Birth control/protection: Pill  Other Topics Concern  . None  Social History Narrative  . None    Sleep: Poor  Appetite:  Good  Current Medications: Current Facility-Administered Medications  Medication Dose Route Frequency Provider Last Rate Last Dose  . 0.9 %  sodium chloride infusion   Intravenous Continuous Collene Gobble, MD   Stopped at 11/22/17 1352  . acetaminophen (TYLENOL) tablet 650 mg  650 mg Oral Q6H PRN  Vertis Kelch, NP   650 mg at 11/24/17 2327  . buPROPion [name]) tablet 100 mg  100 mg Oral BID Rosita Fire, MD   100 mg at 11/25/17 1208  . cholestyramine (QUESTRAN) packet 4 g  4 g Oral Georgeanna Lea, MD   4 g at 11/25/17 1304  . enoxaparin (LOVENOX) injection 60 mg  60 mg Subcutaneous Q24H Rosita Fire, MD   60 mg at 11/25/17 1208  . gabapentin (NEURONTIN) capsule 300 mg  300 mg Oral BID Rosita Fire, MD   300 mg at 11/25/17 1208  . hydrALAZINE (APRESOLINE) injection 10 mg  10 mg Intravenous Q4H PRN Darlina Sicilian A, NP   10 mg at 11/22/17 1358  . insulin aspart (novoLOG) injection 0-20 Units  0-20 Units Subcutaneous TID WC Collene Gobble, MD   11 Units at 11/25/17 1304  . insulin aspart (novoLOG) injection 0-5 Units  0-5 Units Subcutaneous QHS Collene Gobble, MD   2 Units at 11/23/17 2239  . insulin aspart (novoLOG) injection 4 Units  4 Units Subcutaneous TID WC Byrum, Rose Fillers, MD   4 Units at  11/25/17 1306  . insulin glargine (LANTUS) injection 28 Units  28 Units Subcutaneous QHS Collene Gobble, MD   28 Units at 11/24/17 2230  . ipratropium-albuterol (DUONEB) 0.5-2.5 (3) MG/3ML nebulizer solution 3 mL  3 mL Nebulization Q6H Rosita Fire, MD   3 mL at 11/25/17 1001  . levothyroxine (SYNTHROID, LEVOTHROID) tablet 75 mcg  75 mcg Oral QAC breakfast Darlina Sicilian A, NP   75 mcg at 11/25/17 0907  . menthol-cetylpyridinium (CEPACOL) lozenge 3 mg  1 lozenge Oral PRN Deterding, Guadelupe Sabin, MD   3 mg at 11/25/17 0646  . pantoprazole (PROTONIX) EC tablet 40 mg  40 mg Oral Daily Rosita Fire, MD   40 mg at 11/25/17 9242    Lab Results:  Results for orders placed or performed during the hospital encounter of 11/20/17 (from the past 48 hour(s))  Glucose, capillary     Status: Abnormal   Collection Time: 11/23/17  6:50 PM  Result Value Ref Range   Glucose-Capillary 282 (H) 65 - 99 mg/dL   Comment 1 Notify RN   Glucose,  capillary     Status: Abnormal   Collection Time: 11/23/17 10:51 PM  Result Value Ref Range   Glucose-Capillary 222 (H) 65 - 99 mg/dL  Magnesium in AM     Status: Abnormal   Collection Time: 11/24/17  4:48 AM  Result Value Ref Range   Magnesium 1.6 (L) 1.7 - 2.4 mg/dL    Comment: Performed at [facility] [address] [place], [place] [address]  Basic metabolic panel     Status: Abnormal   Collection Time: 11/24/17  4:48 AM  Result Value Ref Range   Sodium 140 135 - 145 mmol/L   Potassium 4.0 3.5 - 5.1 mmol/L    Comment: SPECIMEN HEMOLYZED. HEMOLYSIS MAY AFFECT INTEGRITY OF RESULTS.   Chloride 103 101 - 111 mmol/L   CO2 23 22 - 32 mmol/L   Glucose, Bld 225 (H) 65 - 99 mg/dL   BUN 25 (H) 6 - 20 mg/dL   Creatinine, Ser 1.00 0.44 - 1.00 mg/dL   Calcium 9.4 8.9 - 10.3 mg/dL   GFR calc non Af Amer >60 >60 mL/min   GFR calc Af Amer >60 >60 mL/min    Comment: (NOTE) The eGFR has been calculated using the CKD EPI equation. This calculation has not been validated in all clinical situations. eGFR's persistently <60 mL/min signify possible Chronic Kidney Disease.    Anion gap 14 5 - 15    Comment: Performed at [facility] [address] [place], [place] [address]  CBC     Status: Abnormal   Collection Time: 11/24/17  4:48 AM  Result Value Ref Range   WBC 12.4 (H) 4.0 - 10.5 K/uL   RBC 4.71 3.87 - 5.11 MIL/uL   Hemoglobin 13.5 12.0 - 15.0 g/dL   HCT 42.3 36.0 - 46.0 %   MCV 89.8 78.0 - 100.0 fL   MCH 28.7 26.0 - 34.0 pg   MCHC 31.9 30.0 - 36.0 g/dL   RDW 12.9 11.5 - 15.5 %   Platelets 310 150 - 400 K/uL    Comment: Performed at [facility], [place] [address] [place], [place] [address]  Glucose, capillary     Status: Abnormal   Collection Time: 11/24/17  7:53 AM  Result Value Ref Range   Glucose-Capillary 235 (H) 65 - 99 mg/dL  Glucose, capillary     Status: Abnormal  Collection Time: 11/24/17  1:00 PM  Result Value Ref Range   Glucose-Capillary 341 (H)  65 - 99 mg/dL  Glucose, capillary     Status: Abnormal   Collection Time: 11/24/17  5:03 PM  Result Value Ref Range   Glucose-Capillary 260 (H) 65 - 99 mg/dL  Glucose, capillary     Status: Abnormal   Collection Time: 11/24/17  9:35 PM  Result Value Ref Range   Glucose-Capillary 190 (H) 65 - 99 mg/dL   Comment 1 Notify RN    Comment 2 Document in Chart   Basic metabolic panel     Status: Abnormal   Collection Time: 11/25/17  7:33 AM  Result Value Ref Range   Sodium 138 135 - 145 mmol/L   Potassium 3.9 3.5 - 5.1 mmol/L   Chloride 101 101 - 111 mmol/L   CO2 24 22 - 32 mmol/L   Glucose, Bld 290 (H) 65 - 99 mg/dL   BUN 25 (H) 6 - 20 mg/dL   Creatinine, Ser 0.87 0.44 - 1.00 mg/dL   Calcium 9.3 8.9 - 10.3 mg/dL   GFR calc non Af Amer >60 >60 mL/min   GFR calc Af Amer >60 >60 mL/min    Comment: (NOTE) The eGFR has been calculated using the CKD EPI equation. This calculation has not been validated in all clinical situations. eGFR's persistently <60 mL/min signify possible Chronic Kidney Disease.    Anion gap 13 5 - 15    Comment: Performed at [facility] [address], [place], [place] [address]  Magnesium     Status: None   Collection Time: 11/25/17  7:33 AM  Result Value Ref Range   Magnesium 1.7 1.7 - 2.4 mg/dL    Comment: Performed at [facility] [address], [place], [place] [address]  Glucose, capillary     Status: Abnormal   Collection Time: 11/25/17  8:39 AM  Result Value Ref Range   Glucose-Capillary 259 (H) 65 - 99 mg/dL   Comment 1 Notify RN   Glucose, capillary     Status: Abnormal   Collection Time: 11/25/17 12:48 PM  Result Value Ref Range   Glucose-Capillary 286 (H) 65 - 99 mg/dL    Blood Alcohol level:  Lab Results  Component Value Date   ETH <10 11/20/2017    Musculoskeletal: Strength & Muscle Tone: within normal limits Gait & Station: UTA since sitting in a chair.  Patient leans: N/A  Psychiatric Specialty Exam: Physical Exam   Nursing note and vitals reviewed. Constitutional: She is oriented to person, place, and time. She appears well-developed and well-nourished.  HENT:  Head: Normocephalic and atraumatic.  Neck: Normal range of motion.  Respiratory: Effort normal.  Musculoskeletal: Normal range of motion.  Neurological: She is alert and oriented to person, place, and time.  Skin: No rash noted.  Psychiatric: She has a normal mood and affect. Her speech is normal and behavior is normal. Judgment and thought content normal. Cognition and memory are normal.    Review of Systems  Psychiatric/Behavioral: Positive for depression. Negative for hallucinations, substance abuse and suicidal ideas. The patient is nervous/anxious and has insomnia.   All other systems reviewed and are negative.   Blood pressure 137/62, pulse (!) 103, temperature 98.1 F (36.7 C), temperature source Oral, resp. rate (!) 26, height 4' 11"  (1.499 m), weight (!) 138.7 kg (305 lb 12.5 oz), SpO2 96 %.Body mass index is 61.76 kg/m.  General Appearance: Fairly Groomed, middle  aged, Caucasian female, wearing a hospital gown with a horse and low voice and oily hair. She is sitting in a chair. NAD.   Eye Contact:  Good  Speech:  Clear and Coherent and Normal Rate  Volume:  Normal  Mood:  "Okay"  Affect:  Appropriate and comical today.   Thought Process:  Goal Directed, Linear and Descriptions of Associations: Intact  Orientation:  Full (Time, Place, and Person)  Thought Content:  Logical  Suicidal Thoughts:  No  Homicidal Thoughts:  No  Memory:  Immediate;   Good Recent;   Good Remote;   Good  Judgement:  Fair  Insight:  Fair  Psychomotor Activity:  Normal  Concentration:  Concentration: Good and Attention Span: Good  Recall:  Good  Fund of Knowledge:  Good  Language:  Good  Akathisia:  No  Handed:  Right  AIMS (if indicated):   N/A  Assets:  Communication Skills Desire for Improvement Financial  Resources/Insurance Housing Social Support  ADL's:  Intact  Cognition:  WNL  Sleep:   Poor   Assessment:  [name] is a 42 y.o. female who was admitted for suicide attempt by drug overdose in the setting of depression and family stressors. She denies SI today but complains of anxiety and insomnia. She is agreeable to restarting Atarax and Trazodone for anxiety and insomnia respectively. She continues to warrant inpatient psychiatric hospitalization for stabilization and treatment due to the severity of her almost lethal suicide attempt.    Treatment Plan Summary: -Patient warrants inpatient psychiatric hospitalization given high risk of harm to self. -Continue Engineer, materials.  -Continue home medications: Continue Wellbutrin 100 mg BID for depression. Restart Trazodone 50 mg qhs PRN for insomnia. Can provide an additional 50 mg qhs PRN if not sleeping within 1 hour after initial dose. Restart Atarax 25 mg TID PRN for anxiety.  -Please pursue involuntary commitment if patient refuses voluntary psychiatric hospitalization or attempts to leave the hospital.  -Will sign off on patient at this time. Please consult psychiatry again as needed.     Faythe Dingwall, DO 11/25/2017, 1:16 PM

## 2017-11-25 NOTE — Progress Notes (Signed)
Inpatient Diabetes Program Recommendations  AACE/ADA: New Consensus Statement on Inpatient Glycemic Control (2015)  Target Ranges:  Prepandial:   less than 140 mg/dL      Peak postprandial:   less than 180 mg/dL (1-2 hours)      Critically ill patients:  140 - 180 mg/dL   Lab Results  Component Value Date   GLUCAP 259 (H) 11/25/2017   HGBA1C 7.2 (H) 11/20/2017    Review of Glycemic Control  Blood sugars continue > 180 mg/dL.  Inpatient Diabetes Program Recommendations:   -Increase Lantus to 32 units daily -Increase Novolog meal coverage to 6 units tid if eats 50%  Continue to follow.   Thank you. Lorenda Peck, RD, LDN, CDE Inpatient Diabetes Coordinator [phone]

## 2017-11-25 NOTE — Progress Notes (Signed)
Physical Therapy Treatment Patient Details Name: [name] MRN: [id number] DOB: [birth year] Today's Date: 11/25/2017    History of Present Illness Patient is a 42 y/o female who presents with drug overdose and seizure. Intubated 3/13-3/15. PMH includes ORIF RLE, depression, anxiety, morbid obesity, DM, Cushing's, Rt uni knee replacement, HTN, left ankle surgery, dyslipidemia, hypothyroidism.     PT Comments    Patient progressing well towards PT goals. Improved ambulation distance today with supervision for safety. Started using RW for support for energy conservation due to DOE and wheezing progressing to no DME. Difficulty with walking and talking. Sp02 >95% on RA and HR up to 131 bpm. Seems to be closer to her cognitive baseline. Waiting for a bed on behavorial health. Will follow.    Follow Up Recommendations  Other (comment);Supervision - Intermittent(behavorial health)     Equipment Recommendations  None recommended by PT    Recommendations for Other Services       Precautions / Restrictions Precautions Precautions: Fall Precaution Comments: tachycardia; suicide precautions Restrictions Weight Bearing Restrictions: No    Mobility  Bed Mobility Overal bed mobility: Modified Independent             General bed mobility comments: Able to get to EOB and return to bed without assist or difficulty.   Transfers Overall transfer level: Needs assistance Equipment used: Rolling walker (2 wheeled) Transfers: Sit to/from Stand Sit to Stand: Supervision         General transfer comment: Supervision for safety. Stood from [employer].   Ambulation/Gait Ambulation/Gait assistance: Supervision Ambulation Distance (Feet): 120 Feet Assistive device: None;Rolling walker (2 wheeled) Gait Pattern/deviations: Step-through pattern;Decreased stride length;Wide base of support Gait velocity: decreased Gait velocity interpretation: Below normal speed for age/gender General Gait  Details: Slow, steady gait with right knee soreness. Does not need RW. Started for energy conservation progressed to no DME. HR up to 131 bpm. Wheezing present throughout with difficulty walking and talking but Sp02 >95% on RA.    Stairs            Wheelchair Mobility    Modified Rankin (Stroke Patients Only)       Balance Overall balance assessment: Needs assistance Sitting-balance support: Feet supported;No upper extremity supported Sitting balance-Leahy Scale: Good     Standing balance support: During functional activity Standing balance-Leahy Scale: Good                              Cognition Arousal/Alertness: Awake/alert Behavior During Therapy: WFL for tasks assessed/performed Overall Cognitive Status: No family/caregiver present to determine baseline cognitive functioning                                 General Comments: Seems to be [facility] for basic tasks today. Joking appropriately. Swearing- "I am a yankee"      Exercises      General Comments General comments (skin integrity, edema, etc.): mother and spouse present during session.      Pertinent Vitals/Pain Pain Assessment: No/denies pain    Home Living                      Prior Function            PT Goals (current goals can now be found in the care plan section) Progress towards PT goals: Progressing toward goals  Frequency    Min 3X/week      PT Plan Current plan remains appropriate    Co-evaluation              AM-PAC PT "6 Clicks" Daily Activity  Outcome Measure  Difficulty turning over in bed (including adjusting bedclothes, sheets and blankets)?: None Difficulty moving from lying on back to sitting on the side of the bed? : None Difficulty sitting down on and standing up from a chair with arms (e.g., wheelchair, bedside commode, etc,.)?: None Help needed moving to and from a bed to chair (including a wheelchair)?: None Help needed  walking in hospital room?: A Little Help needed climbing 3-5 steps with a railing? : A Little 6 Click Score: 22    End of Session   Activity Tolerance: Patient tolerated treatment well;Other (comment)(wheezing, dyspnea) Patient left: in bed;with call bell/phone within reach;with nursing/sitter in room;with family/visitor present Nurse Communication: Mobility status PT Visit Diagnosis: Muscle weakness (generalized) (M62.81);Unsteadiness on feet (R26.81);Difficulty in walking, not elsewhere classified (R26.2)     Time: 5732-2025 PT Time Calculation (min) (ACUTE ONLY): 19 min  Charges:  $Therapeutic Exercise: 8-22 mins                    G Codes:       Wray Kearns, PT, DPT [phone]     Marguarite Arbour A Gaetano Romberger 11/25/2017, 11:47 AM

## 2017-11-26 DIAGNOSIS — R490 Dysphonia: Secondary | ICD-10-CM

## 2017-11-26 LAB — BASIC METABOLIC PANEL
ANION GAP: 13 (ref 5–15)
BUN: 24 mg/dL — AB (ref 6–20)
CALCIUM: 9.5 mg/dL (ref 8.9–10.3)
CO2: 23 mmol/L (ref 22–32)
CREATININE: 0.91 mg/dL (ref 0.44–1.00)
Chloride: 102 mmol/L (ref 101–111)
GFR calc Af Amer: >60 mL/min (ref >60)
GFR calc non Af Amer: >60 mL/min (ref >60)
GLUCOSE: 317 mg/dL — AB (ref 65–99)
Potassium: 4.6 mmol/L (ref 3.5–5.1)
Sodium: 138 mmol/L (ref 135–145)

## 2017-11-26 LAB — CBC
HEMATOCRIT: 42.3 % (ref 36.0–46.0)
Hemoglobin: 13.5 g/dL (ref 12.0–15.0)
MCH: 29.1 pg (ref 26.0–34.0)
MCHC: 31.9 g/dL (ref 30.0–36.0)
MCV: 91.2 fL (ref 78.0–100.0)
PLATELETS: 372 10*3/uL (ref 150–400)
RBC: 4.64 MIL/uL (ref 3.87–5.11)
RDW: 13.2 % (ref 11.5–15.5)
WBC: 12.2 10*3/uL — AB (ref 4.0–10.5)

## 2017-11-26 LAB — GLUCOSE, CAPILLARY
Glucose-Capillary: 318 mg/dL — ABNORMAL HIGH (ref 65–99)
Glucose-Capillary: 328 mg/dL — ABNORMAL HIGH (ref 65–99)
Glucose-Capillary: 319 mg/dL — ABNORMAL HIGH (ref 65–99)
Glucose-Capillary: 307 mg/dL — ABNORMAL HIGH (ref 65–99)

## 2017-11-26 LAB — MAGNESIUM: Magnesium: 1.6 mg/dL — ABNORMAL LOW (ref 1.7–2.4)

## 2017-11-26 MED ORDER — DEXAMETHASONE SODIUM PHOSPHATE 10 MG/ML IJ SOLN
10.0000 mg | Freq: Two times a day (BID) | INTRAMUSCULAR | Status: AC
  Administered 2017-11-26 (×2): 10 mg via INTRAVENOUS
  Filled 2017-11-26 (×2): qty 1

## 2017-11-26 MED ORDER — INSULIN GLARGINE 100 UNIT/ML ~~LOC~~ SOLN
15.0000 [IU] | Freq: Once | SUBCUTANEOUS | Status: AC
  Administered 2017-11-26: 15 [IU] via SUBCUTANEOUS
  Filled 2017-11-26: qty 0.15

## 2017-11-26 MED ORDER — PANTOPRAZOLE SODIUM 40 MG PO TBEC
40.0000 mg | DELAYED_RELEASE_TABLET | Freq: Two times a day (BID) | ORAL | Status: DC
  Administered 2017-11-26 – 2017-11-27 (×3): 40 mg via ORAL
  Filled 2017-11-26 (×3): qty 1

## 2017-11-26 MED ORDER — MAGNESIUM SULFATE 2 GM/50ML IV SOLN
2.0000 g | Freq: Once | INTRAVENOUS | Status: AC
  Administered 2017-11-26: 2 g via INTRAVENOUS
  Filled 2017-11-26 (×2): qty 50

## 2017-11-26 MED ORDER — INSULIN GLARGINE 100 UNIT/ML ~~LOC~~ SOLN
35.0000 [IU] | Freq: Every day | SUBCUTANEOUS | Status: DC
  Administered 2017-11-26: 35 [IU] via SUBCUTANEOUS
  Filled 2017-11-26 (×2): qty 0.35

## 2017-11-26 MED ORDER — INSULIN ASPART 100 UNIT/ML ~~LOC~~ SOLN
7.0000 [IU] | Freq: Three times a day (TID) | SUBCUTANEOUS | Status: DC
  Administered 2017-11-26 – 2017-11-27 (×4): 7 [IU] via SUBCUTANEOUS

## 2017-11-26 NOTE — Procedures (Signed)
Preop diagnosis: Dysphonia, stridor Postop diagnosis: same Procedure: Transnasal fiberoptic laryngoscopy Surgeon: Redmond Baseman Anesth: Topical with 4% lidocaine Compl: None Findings: The vocal folds are mobile but with limited abduction.  The posterior vocal folds have irregular inflammatory changes at the vocal processes. Description:  After discussing risks, benefits, and alternatives, the patient was placed in a seated position and the right nasal passage was sprayed with topical anesthetic.  The fiberoptic scope was passed through the right nasal passage to view the pharynx and larynx.  Findings are noted above.  The scope was then removed and he was returned to nursing care in stable condition.

## 2017-11-26 NOTE — Progress Notes (Signed)
Physical Therapy Treatment Patient Details Name: [name] MRN: [id number] DOB: 1975-12-17 Today's Date: 11/26/2017    History of Present Illness Patient is a 42 y/o female who presents with drug overdose and seizure. Intubated 3/13-3/15. PMH includes ORIF RLE, depression, anxiety, morbid obesity, DM, Cushing's, Rt uni knee replacement, HTN, left ankle surgery, dyslipidemia, hypothyroidism.     PT Comments    Patient progressing to meet all goals as initially set for PT.  Upgraded goals this session to independent for mobility.  Continues to develop dyspnea with mobility and elevated HR, but improved per pt with lessening SOB.  She is eager and remains appropriate for skilled PT while in acute stay.    Follow Up Recommendations  Other (comment);Supervision - Intermittent(inpatient psych stay)     Equipment Recommendations  None recommended by PT    Recommendations for Other Services       Precautions / Restrictions Precautions Precautions: Fall Precaution Comments: tachycardia; suicide precautions    Mobility  Bed Mobility Overal bed mobility: Modified Independent                Transfers     Transfers: Sit to/from Stand Sit to Stand: Supervision            Ambulation/Gait Ambulation/Gait assistance: Supervision;Min guard Ambulation Distance (Feet): 200 Feet Assistive device: None Gait Pattern/deviations: Step-through pattern     General Gait Details: SpO2 93-95% ambulating on RA, audible wheezing, HR up to 136, no physical help needed for ambulation, but dyspnea 2-3/4   Stairs            Wheelchair Mobility    Modified Rankin (Stroke Patients Only)       Balance Overall balance assessment: Needs assistance   Sitting balance-Leahy Scale: Good       Standing balance-Leahy Scale: Good                 High Level Balance Comments: side stepping at sink no UE support, semi tandem stand x 30 sec bilateral              Cognition Arousal/Alertness: Awake/alert Behavior During Therapy: WFL for tasks assessed/performed Overall Cognitive Status: Within Functional Limits for tasks assessed                                        Exercises General Exercises - Lower Extremity Hip ABduction/ADduction: 10 reps;Strengthening;Standing Heel Raises: 10 reps;Strengthening;Standing Other Exercises Other Exercises: sit<>stand x 5 no UE support    General Comments        Pertinent Vitals/Pain Pain Assessment: No/denies pain    Home Living                      Prior Function            PT Goals (current goals can now be found in the care plan section) Progress towards PT goals: Goals met and updated - see care plan    Frequency    Min 3X/week      PT Plan Current plan remains appropriate    Co-evaluation              AM-PAC PT "6 Clicks" Daily Activity  Outcome Measure  Difficulty turning over in bed (including adjusting bedclothes, sheets and blankets)?: None Difficulty moving from lying on back to sitting on the side of the bed? : A Little  Difficulty sitting down on and standing up from a chair with arms (e.g., wheelchair, bedside commode, etc,.)?: None Help needed moving to and from a bed to chair (including a wheelchair)?: None Help needed walking in hospital room?: A Little Help needed climbing 3-5 steps with a railing? : A Little 6 Click Score: 21    End of Session   Activity Tolerance: Patient tolerated treatment well Patient left: in bed;with call bell/phone within reach;with nursing/sitter in room   PT Visit Diagnosis: Muscle weakness (generalized) (M62.81);Unsteadiness on feet (R26.81);Difficulty in walking, not elsewhere classified (R26.2)     Time: 4098-1191 PT Time Calculation (min) (ACUTE ONLY): 16 min  Charges:  $Gait Training: 8-22 mins                    G CodesMagda Kiel, [place] 11/26/2017   Reginia Naas 11/26/2017, 12:53 PM

## 2017-11-26 NOTE — Consult Note (Signed)
Reason for Consult: Hoarseness and stridor Referring Physician: Hospitalist  [name] is an 42 y.o. female.  HPI: 42 year old female was admitted 3/13 following a drug overdose and required intubation.  She was extubated two days later and has had a poor voice since then.  She has had some noisy breathing and had an acute airway problem last night and required rapid response and IV steroids and racemic epinephrine.  She has been breathing better since then.  Past Medical History:  Diagnosis Date  . Anxiety   . Complication of anesthesia    Pt. states takes long time to wake up from it.   . Cushing's disease ([place])   . Depression   . Diabetes ([place])   . Hyperlipidemia   . Hyperlipidemia   . Hypertension   . Morbid obesity ([place])   . Osteoporosis 07/19/2015  . Periprosthetic fracture 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[place]), R tibial plateau  07/18/2015  . Sleep apnea   . Uncontrolled diabetes mellitus with diabetic neuropathy, with long-term current use of insulin ([place]) 07/16/2015  . Vitamin D deficiency 07/19/2015    Past Surgical History:  Procedure Laterality Date  . ANTERIOR TALOFIBULAR LIGAMENT REPAIR Left 11/15/2014   Procedure: ANTERIOR TALOFIBULAR LIGAMENT REPAIR;  Surgeon: Jana Half, DPM;  Location: [facility];  Service: Podiatry;  Laterality: Left;  . CESAREAN SECTION  dec 1997/  06-03-2001/   01-01-2005   BILATERAL TUBAL LIGATION WITH LAST ONE  . DILATION AND CURETTAGE OF UTERUS  1995   WITH SUCTION  . ESOPHAGOGASTRODUODENOSCOPY (EGD) WITH PROPOFOL N/A 10/04/2016   Procedure: ESOPHAGOGASTRODUODENOSCOPY (EGD) WITH PROPOFOL;  Surgeon: [name], MD;  Location: [facility];  Service: Endoscopy;  Laterality: N/A;  . LAPAROSCOPIC CHOLECYSTECTOMY  09-25-2005  . ORIF TIBIA PLATEAU Right 07/19/2015   Procedure: OPEN REDUCTION INTERNAL FIXATION (ORIF) RIGHT TIBIAL PLATEAU;  Surgeon: Altamese [place], MD;  Location: [facility];  Service: Orthopedics;  Laterality:  Right;  . PARTIAL KNEE ARTHROPLASTY Right 04/19/2014   Procedure: RIGHT UNI KNEE ARTHROPLASTY MEDIALLY ;  Surgeon: Mauri Pole, MD;  Location: [facility];  Service: Orthopedics;  Laterality: Right;  . TUBAL LIGATION      Family History  Adopted: Yes  Problem Relation Age of Onset  . Autism Son   . ADD / ADHD Son   . Apraxia Son     Social History:  reports that she has been smoking cigarettes.  She has smoked for the past 4.00 years. she has never used smokeless tobacco. She reports that she drinks alcohol. She reports that she does not use drugs.  Allergies:  Allergies  Allergen Reactions  . Penicillins Hives    Has patient had a PCN reaction causing immediate rash, facial/tongue/throat swelling, SOB or lightheadedness with hypotension: Yes Has patient had a PCN reaction causing severe rash involving mucus membranes or skin necrosis: Yes Has patient had a PCN reaction that required hospitalization No Has patient had a PCN reaction occurring within the last 10 years: No If all of the above answers are "NO", then may proceed with Cephalosporin use.     Medications: I have reviewed the patient's current medications.  Results for orders placed or performed during the hospital encounter of 11/20/17 (from the past 48 hour(s))  Glucose, capillary     Status: Abnormal   Collection Time: 11/24/17  5:03 PM  Result Value Ref Range   Glucose-Capillary 260 (H) 65 - 99 mg/dL  Glucose, capillary  Status: Abnormal   Collection Time: 11/24/17  9:35 PM  Result Value Ref Range   Glucose-Capillary 190 (H) 65 - 99 mg/dL   Comment 1 Notify RN    Comment 2 Document in Chart   Basic metabolic panel     Status: Abnormal   Collection Time: 11/25/17  7:33 AM  Result Value Ref Range   Sodium 138 135 - 145 mmol/L   Potassium 3.9 3.5 - 5.1 mmol/L   Chloride 101 101 - 111 mmol/L   CO2 24 22 - 32 mmol/L   Glucose, Bld 290 (H) 65 - 99 mg/dL   BUN 25 (H) 6 - 20 mg/dL   Creatinine, Ser 0.87 0.44 -  1.00 mg/dL   Calcium 9.3 8.9 - 10.3 mg/dL   GFR calc non Af Amer >60 >60 mL/min   GFR calc Af Amer >60 >60 mL/min    Comment: (NOTE) The eGFR has been calculated using the CKD EPI equation. This calculation has not been validated in all clinical situations. eGFR's persistently <60 mL/min signify possible Chronic Kidney Disease.    Anion gap 13 5 - 15    Comment: Performed at [facility] [address] [place], [place] [address]  Magnesium     Status: None   Collection Time: 11/25/17  7:33 AM  Result Value Ref Range   Magnesium 1.7 1.7 - 2.4 mg/dL    Comment: Performed at [facility] [address] [place], [place] [address]  Glucose, capillary     Status: Abnormal   Collection Time: 11/25/17  8:39 AM  Result Value Ref Range   Glucose-Capillary 259 (H) 65 - 99 mg/dL   Comment 1 Notify RN   D-dimer, quantitative (not at [facility])     Status: None   Collection Time: 11/25/17 12:20 PM  Result Value Ref Range   D-Dimer, Quant 0.31 0.00 - 0.50 ug/mL-FEU    Comment: (NOTE) At the manufacturer cut-off of 0.50 ug/mL FEU, this assay has been documented to exclude PE with a sensitivity and negative predictive value of 97 to 99%.  At this time, this assay has not been approved by the FDA to exclude DVT/VTE. Results should be correlated with clinical presentation. Performed at [facility], [place] [address] [place],  [address]   Troponin I     Status: None   Collection Time: 11/25/17 12:20 PM  Result Value Ref Range   Troponin I <0.03 <0.03 ng/mL    Comment: Performed at [facility] [address] [place], [place] [address]  Glucose, capillary     Status: Abnormal   Collection Time: 11/25/17 12:48 PM  Result Value Ref Range   Glucose-Capillary 286 (H) 65 - 99 mg/dL  Glucose, capillary     Status: Abnormal   Collection Time: 11/25/17  5:02 PM  Result Value Ref Range   Glucose-Capillary 250 (H) 65 - 99 mg/dL  Glucose, capillary     Status: Abnormal    Collection Time: 11/25/17  8:55 PM  Result Value Ref Range   Glucose-Capillary 273 (H) 65 - 99 mg/dL   Comment 1 Notify RN    Comment 2 Document in Chart   CBC     Status: Abnormal   Collection Time: 11/26/17  5:20 AM  Result Value Ref Range   WBC 12.2 (H) 4.0 - 10.5 K/uL   RBC 4.64 3.87 - 5.11 MIL/uL   Hemoglobin 13.5 12.0 - 15.0 g/dL   HCT 42.3 36.0 -  46.0 %   MCV 91.2 78.0 - 100.0 fL   MCH 29.1 26.0 - 34.0 pg   MCHC 31.9 30.0 - 36.0 g/dL   RDW 13.2 11.5 - 15.5 %   Platelets 372 150 - 400 K/uL    Comment: Performed at [facility], [place] [address], [place], [place] [address]  Basic metabolic panel     Status: Abnormal   Collection Time: 11/26/17  5:20 AM  Result Value Ref Range   Sodium 138 135 - 145 mmol/L   Potassium 4.6 3.5 - 5.1 mmol/L   Chloride 102 101 - 111 mmol/L   CO2 23 22 - 32 mmol/L   Glucose, Bld 317 (H) 65 - 99 mg/dL   BUN 24 (H) 6 - 20 mg/dL   Creatinine, Ser 0.91 0.44 - 1.00 mg/dL   Calcium 9.5 8.9 - 10.3 mg/dL   GFR calc non Af Amer >60 >60 mL/min   GFR calc Af Amer >60 >60 mL/min    Comment: (NOTE) The eGFR has been calculated using the CKD EPI equation. This calculation has not been validated in all clinical situations. eGFR's persistently <60 mL/min signify possible Chronic Kidney Disease.    Anion gap 13 5 - 15    Comment: Performed at [facility] [address], [place], [place] [address]  Magnesium     Status: Abnormal   Collection Time: 11/26/17  5:20 AM  Result Value Ref Range   Magnesium 1.6 (L) 1.7 - 2.4 mg/dL    Comment: Performed at [facility] [address] [place], [place] [address]  Glucose, capillary     Status: Abnormal   Collection Time: 11/26/17  8:00 AM  Result Value Ref Range   Glucose-Capillary 318 (H) 65 - 99 mg/dL  Glucose, capillary     Status: Abnormal   Collection Time: 11/26/17 11:59 AM  Result Value Ref Range   Glucose-Capillary 328 (H) 65 - 99 mg/dL  Glucose, capillary     Status: Abnormal    Collection Time: 11/26/17  4:03 PM  Result Value Ref Range   Glucose-Capillary 319 (H) 65 - 99 mg/dL    Dg Chest Port 1 View  Result Date: 11/26/2017 CLINICAL DATA:  Shortness of breath EXAM: PORTABLE CHEST 1 VIEW COMPARISON:  11/25/2017, 11/20/2017, 12/12/2015 FINDINGS: Cardiomegaly. No pleural effusion or focal consolidation. No pneumothorax. IMPRESSION: Cardiomegaly.  Negative for edema or focal pulmonary infiltrate. Electronically Signed   By: Donavan Foil M.D.   On: 11/26/2017 00:00   Dg Chest Port 1 View  Result Date: 11/25/2017 CLINICAL DATA:  Shortness of breath. Decreased breath sounds in the lung bases bilaterally. EXAM: PORTABLE CHEST 1 VIEW COMPARISON:  Single-view of the chest 11/23/2017 and 11/20/2017. PA and lateral chest 12/12/2014. FINDINGS: There is cardiomegaly without edema. The lungs are clear. No pneumothorax or pleural fluid. No acute bony abnormality. IMPRESSION: Cardiomegaly without acute disease. Electronically Signed   By: Inge Rise M.D.   On: 11/25/2017 09:28    Review of Systems  Respiratory: Positive for stridor (and hoarseness).   All other systems reviewed and are negative.  Blood pressure (!) 120/59, pulse (!) 114, temperature 98 F (36.7 C), temperature source Oral, resp. rate 20, height 4' 11"  (1.499 m), weight (!) 306 lb 3.2 oz (138.9 kg), SpO2 100 %. Physical Exam  Constitutional: She is oriented to person, place, and time. She appears well-developed and well-nourished. No distress.  HENT:  Head: Normocephalic and atraumatic.  Right Ear: External  ear normal.  Left Ear: External ear normal.  Nose: Nose normal.  Mouth/Throat: Oropharynx is clear and moist.  Nearly aphonic.  Eyes: Conjunctivae and EOM are normal. Pupils are equal, round, and reactive to light.  Neck: Normal range of motion. Neck supple.  Cardiovascular: Normal rate.  Respiratory: Effort normal.  Soft inspiratory stridor.  Musculoskeletal: Normal range of motion.   Neurological: She is alert and oriented to person, place, and time.  Skin: Skin is warm and dry.  Psychiatric: She has a normal mood and affect. Her behavior is normal. Judgment and thought content normal.    Assessment/Plan: Dysphonia, inspiratory stridor following intubation  In order to better evaluate her airway, a flexible laryngoscopy was performed at the bedside.  See procedure note.  There is evidence of inflammatory changes of the posterior glottis from intubation that is inhibiting full abduction of the vocal folds and limiting vibration.  This should resolve fully with time.  I agree with scheduled Decadron and will increase Protonix to twice daily dosing.  She can follow-up as an outpatient.  [name] 11/26/2017, 5:02 PM

## 2017-11-26 NOTE — Progress Notes (Signed)
Pt having some issues swallowing and has stridor that is audible without auscultation.  Pt and RN both state this is new onset tonight that has gotten worse throughout.  Penni Homans with Rapid Response to assess he called MD  racemic epinephrine ordered and given by nebulization and stat chest xray done.  RT will continue to follow up with patients progress throughout the night.

## 2017-11-26 NOTE — Plan of Care (Signed)
PT Goals upgraded due to met previous [place], [place] [phone] 11/26/2017  Acute Rehab PT Goals(only PT should resolve) Pt Will Go Supine/Side To Sit 11/26/2017 1255 by Max Sane, PT Flowsheets Taken 11/26/2017 1255  Pt will go Supine/Side to Sit Independently Pt Will Go Sit To Supine/Side 11/26/2017 1255 by Max Sane, PT Flowsheets Taken 11/26/2017 1255  Pt will go Sit to Supine/Side Independently Patient Will Transfer Sit To/From Stand 11/26/2017 1255 by [place] Taken 11/26/2017 1255  Patient will transfer sit to/from stand Independently Pt Will Ambulate 11/26/2017 1255 by [place] Taken 11/26/2017 1255  Pt will Ambulate > 125 feet;Independently

## 2017-11-26 NOTE — Progress Notes (Signed)
PROGRESS NOTE    [name]  YIR:[id number] DOB: 24-May-1976 DOA: 11/20/2017 PCP: Sandi Mariscal, MD   Brief Narrative: 42 year old morbidly obese female with history of Cushing's disease, diabetes on insulin, depression, hypertension, OSA who was admitted on 3/13 to ICU for intestinal drug overdose.  Patient took extra pills of baclofen, trazodone and tramadol.  Patient became encephalopathic and had generalized seizure in ER therefore patient was intubated and admitted by pulmonary critical care.  Patient was evaluated by psychiatrist who recommended inpatient psych on discharge.  Started on diuretics for pulmonary congestion.  Transferred to [facility] on 11/24/2017.  Assessment & Plan:  #Intentional overdose with suicidal ideation: Patient is now extubated.  Seen by psychiatrist.  Recommended inpatient psych on discharge.  Continue bed sitter.   -Patient was reevaluated by psychiatrist yesterday.  Resumed Wellbutrin, Neurontin.  Also on Atarax and trazodone as needed.  Patient's mood is stable today.  Social worker evaluation for inpatient behavioral [place] discharge.  Continue to provide supportive care.  #Hoarseness and mild stridor: Overnight event noted.  Patient received steroid.  Currently she feels better.  Her voice is still has hoarseness.  No stridor noticed this morning however reports some neck discomfort.  Able to swallow well.  Ordered 2 doses of Decadron.  ENT consult requested to Dr.Bates.  Continue to provide supportive care.  #Acute toxic encephalopathy with inability to protect airway/in the setting of intentional overdose requiring intubation.  Currently extubated.   -Mental status improved.  Continue to monitor.  #Acute pulmonary edema: Currently on IV Lasix.  Repeat chest x-ray with cardiomegaly.  Continue for now.  #Shortness of breath and chest pain: Chest x-ray, EKG, troponin, d-dimer are unremarkable.  Most likely contributed by anxiety which is improved  now.  #Hypotension improved now.  Patient has history of hypertension.  She is on Lasix.  Blood pressure improved.  #Acute kidney injury and hyperkalemia: Improved now.  #Hypomagnesemia: Replete magnesium.  Monitor labs.  #Seizure disorder in the setting of overdose.  Close observation.  #Anxiety depression: Psych follow-up.  #Chronic diabetes with hyperglycemia in obese patient: Patient has hyperglycemia.  She is getting steroid.  I increase the dose of Lantus to 25 units in order extra dose of Lantus 15 units this morning.  Increase NovoLog before meals.  Monitor blood sugar level.  DVT prophylaxis: Lovenox subcutaneous Code Status: Full code Family Communication: No family member at bedside.  Discussed with the patient chest pain yesterday. Disposition Plan: Management for hoarseness niece and ENT consult today.  Likely discharge to inpatient behavioral unit  when bed is available in next 1-2 days.  Social worker consulted    Consultants:   Psychiatric  Admitted by pulmonary critical care service  ENT  Procedures: Mechanical ventilation Antimicrobials: None  Subjective: Seen and examined at bedside.  Overnight event noted.  Currently has no stridor and feels better.  However has some neck discomfort.  No problems swallowing.  Breathing okay.  No chest pain.  Mood is stable.  Has bed sitter. Objective: Vitals:   11/26/17 0005 11/26/17 0500 11/26/17 0700 11/26/17 0824  BP:  130/79    Pulse:  96    Resp:  20    Temp:  97.9 F (36.6 C)    TempSrc:  Oral    SpO2: 95% 93%  95%  Weight:   (!) 138.9 kg (306 lb 3.2 oz)   Height:        Intake/Output Summary (Last 24 hours) at 11/26/2017 0933 Last data  filed at 11/26/2017 0601 Gross per 24 hour  Intake 1617 ml  Output -  Net 1617 ml   Filed Weights   11/24/17 0919 11/25/17 1100 11/26/17 0700  Weight: (!) 139 kg (306 lb 8 oz) (!) 138.7 kg (305 lb 12.5 oz) (!) 138.9 kg (306 lb 3.2 oz)    Examination:  General exam:  Morbidly obese cushingoid looking female sitting on bed Neck: Short.  No  stridor noticed. Respiratory system: Distant breath sound bilateral, normal work of breathing. Cardiovascular system: Regular rate rhythm S1-S2 normal.  Trace nonpitting lower extremity extremity edema. Gastrointestinal system: Abdomen soft, nontender.  Bowel sounds positive Central nervous system: Alert awake and oriented. Skin: No rashes, lesions or ulcers Psychiatry: Denies suicidal or homicidal ideation.  Mood appropriate.    Data Reviewed: I have personally reviewed following labs and imaging studies  CBC: Recent Labs  Lab 11/20/17 1410 11/21/17 0543 11/23/17 0350 11/24/17 0448 11/26/17 0520  WBC 9.5 9.7 12.5* 12.4* 12.2*  NEUTROABS  --  9.1*  --   --   --   HGB 12.6 13.5 13.1 13.5 13.5  HCT 39.3 41.1 41.3 42.3 42.3  MCV 88.9 89.7 91.0 89.8 91.2  PLT 338 322 344 310 829   Basic Metabolic Panel: Recent Labs  Lab 11/20/17 1410 11/20/17 1819 11/21/17 0543 11/21/17 1752 11/22/17 0433 11/23/17 0350 11/24/17 0448 11/25/17 0733 11/26/17 0520  NA  --  138 137  --  142 142 140 138 138  K  --  4.7 5.3*  --  3.9 4.0 4.0 3.9 4.6  CL  --  108 106  --  111 108 103 101 102  CO2  --  19* 20*  --  22 22 23 24 23   GLUCOSE  --  211* 326*  --  241* 207* 225* 290* 317*  BUN  --  24* 24*  --  22* 20 25* 25* 24*  CREATININE  --  1.57* 1.23*  --  0.79 0.84 1.00 0.87 0.91  CALCIUM  --  8.7* 9.0  --  9.0 9.7 9.4 9.3 9.5  MG 1.9 1.7 2.1 1.9  --  1.4* 1.6* 1.7 1.6*  PHOS 3.9 3.2 2.7 2.4*  --   --   --   --   --    GFR: Estimated Creatinine Clearance: 103.6 mL/min (by C-G formula based on SCr of 0.91 mg/dL). Liver Function Tests: Recent Labs  Lab 11/20/17 0219 11/20/17 1410 11/20/17 1819  AST 33 46*  --   ALT 26 50  --   ALKPHOS 111 123  --   BILITOT 0.8 0.9  --   PROT 7.4 6.3*  --   ALBUMIN 3.9 3.3* 3.3*   No results for input(s): LIPASE, AMYLASE in the last 168 hours. No results for input(s):  AMMONIA in the last 168 hours. Coagulation Profile: No results for input(s): INR, PROTIME in the last 168 hours. Cardiac Enzymes: Recent Labs  Lab 11/20/17 0219 11/25/17 1220  CKTOTAL 53  --   TROPONINI  --  <0.03   BNP (last 3 results) No results for input(s): PROBNP in the last 8760 hours. HbA1C: No results for input(s): HGBA1C in the last 72 hours. CBG: Recent Labs  Lab 11/25/17 0839 11/25/17 1248 11/25/17 1702 11/25/17 2055 11/26/17 0800  GLUCAP 259* 286* 250* 273* 318*   Lipid Profile: No results for input(s): CHOL, HDL, LDLCALC, TRIG, CHOLHDL, LDLDIRECT in the last 72 hours. Thyroid Function Tests: No results for input(s): TSH, T4TOTAL, FREET4,  T3FREE, THYROIDAB in the last 72 hours. Anemia Panel: No results for input(s): VITAMINB12, FOLATE, FERRITIN, TIBC, IRON, RETICCTPCT in the last 72 hours. Sepsis Labs: No results for input(s): PROCALCITON, LATICACIDVEN in the last 168 hours.  Recent Results (from the past 240 hour(s))  MRSA PCR Screening     Status: None   Collection Time: 11/20/17  5:13 AM  Result Value Ref Range Status   MRSA by PCR NEGATIVE NEGATIVE Final    Comment:        The GeneXpert MRSA Assay (FDA approved for NASAL specimens only), is one component of a comprehensive MRSA colonization surveillance program. It is not intended to diagnose MRSA infection nor to guide or monitor treatment for MRSA infections. Performed at [facility], [place] [address] [place], [place] [address]          Radiology Studies: Dg Chest Port 1 View  Result Date: 11/26/2017 CLINICAL DATA:  Shortness of breath EXAM: PORTABLE CHEST 1 VIEW COMPARISON:  11/25/2017, 11/20/2017, 12/12/2015 FINDINGS: Cardiomegaly. No pleural effusion or focal consolidation. No pneumothorax. IMPRESSION: Cardiomegaly.  Negative for edema or focal pulmonary infiltrate. Electronically Signed   By: Donavan Foil M.D.   On: 11/26/2017 00:00   Dg Chest Port 1 View  Result Date:  11/25/2017 CLINICAL DATA:  Shortness of breath. Decreased breath sounds in the lung bases bilaterally. EXAM: PORTABLE CHEST 1 VIEW COMPARISON:  Single-view of the chest 11/23/2017 and 11/20/2017. PA and lateral chest 12/12/2014. FINDINGS: There is cardiomegaly without edema. The lungs are clear. No pneumothorax or pleural fluid. No acute bony abnormality. IMPRESSION: Cardiomegaly without acute disease. Electronically Signed   By: Inge Rise M.D.   On: 11/25/2017 09:28        Scheduled Meds: . buPROPion  100 mg Oral BID  . cholestyramine  4 g Oral QODAY  . dexamethasone  10 mg Intravenous Q12H  . enoxaparin (LOVENOX) injection  60 mg Subcutaneous Q24H  . gabapentin  300 mg Oral BID  . insulin aspart  0-20 Units Subcutaneous TID WC  . insulin aspart  0-5 Units Subcutaneous QHS  . insulin aspart  7 Units Subcutaneous TID WC  . insulin glargine  15 Units Subcutaneous Once  . insulin glargine  35 Units Subcutaneous QHS  . ipratropium-albuterol  3 mL Nebulization Q6H  . levothyroxine  75 mcg Oral QAC breakfast  . pantoprazole  40 mg Oral Daily  . protein supplement shake  11 oz Oral BID BM   Continuous Infusions: . sodium chloride Stopped (11/22/17 1352)  . magnesium sulfate 1 - 4 g bolus IVPB       LOS: 6 days    Averly Ericson Tanna Furry, MD [facility] Pager [phone]  If 7PM-7AM, please contact night-coverage [email or web address] Password [facility] 11/26/2017, 9:33 AM

## 2017-11-27 LAB — BASIC METABOLIC PANEL
ANION GAP: 8 (ref 5–15)
Anion gap: 10 (ref 5–15)
BUN: 19 mg/dL (ref 6–20)
BUN: 18 mg/dL (ref 6–20)
CALCIUM: 9.9 mg/dL (ref 8.9–10.3)
CO2: 26 mmol/L (ref 22–32)
CO2: 29 mmol/L (ref 22–32)
CREATININE: 0.79 mg/dL (ref 0.44–1.00)
Calcium: 9.8 mg/dL (ref 8.9–10.3)
Chloride: 100 mmol/L — ABNORMAL LOW (ref 101–111)
Chloride: 101 mmol/L (ref 101–111)
Creatinine, Ser: 0.8 mg/dL (ref 0.44–1.00)
GFR calc Af Amer: >60 mL/min (ref >60)
GFR calc non Af Amer: >60 mL/min (ref >60)
GFR calc non Af Amer: >60 mL/min (ref >60)
Glucose, Bld: 338 mg/dL — ABNORMAL HIGH (ref 65–99)
Glucose, Bld: 350 mg/dL — ABNORMAL HIGH (ref 65–99)
Potassium: 4.8 mmol/L (ref 3.5–5.1)
Potassium: 4.7 mmol/L (ref 3.5–5.1)
SODIUM: 138 mmol/L (ref 135–145)
Sodium: 136 mmol/L (ref 135–145)

## 2017-11-27 LAB — GLUCOSE, CAPILLARY
GLUCOSE-CAPILLARY: 362 mg/dL — AB (ref 65–99)
GLUCOSE-CAPILLARY: 333 mg/dL — AB (ref 65–99)
GLUCOSE-CAPILLARY: 246 mg/dL — AB (ref 65–99)
GLUCOSE-CAPILLARY: 283 mg/dL — AB (ref 65–99)

## 2017-11-27 LAB — MAGNESIUM: MAGNESIUM: 1.9 mg/dL (ref 1.7–2.4)

## 2017-11-27 MED ORDER — METHYLPREDNISOLONE 4 MG PO TABS: 12.0000 mg | ORAL_TABLET | Freq: Every day | ORAL | Status: DC

## 2017-11-27 MED ORDER — INSULIN ASPART 100 UNIT/ML ~~LOC~~ SOLN
8.0000 [IU] | Freq: Three times a day (TID) | SUBCUTANEOUS | Status: DC
  Administered 2017-11-27 – 2017-11-28 (×2): 8 [IU] via SUBCUTANEOUS

## 2017-11-27 MED ORDER — PHENOL 1.4 % MT LIQD
1.0000 | OROMUCOSAL | Status: DC | PRN
  Administered 2017-11-27: 1 via OROMUCOSAL
  Filled 2017-11-27: qty 177

## 2017-11-27 MED ORDER — METHYLPREDNISOLONE 4 MG PO TABS
24.0000 mg | ORAL_TABLET | Freq: Once | ORAL | Status: AC
  Administered 2017-11-27: 12:00:00 24 mg via ORAL
  Filled 2017-11-27: qty 2

## 2017-11-27 MED ORDER — IPRATROPIUM-ALBUTEROL 0.5-2.5 (3) MG/3ML IN SOLN
3.0000 mL | Freq: Three times a day (TID) | RESPIRATORY_TRACT | Status: DC
  Administered 2017-11-28 – 2017-12-04 (×18): 3 mL via RESPIRATORY_TRACT
  Filled 2017-11-27 (×18): qty 3

## 2017-11-27 MED ORDER — IPRATROPIUM-ALBUTEROL 0.5-2.5 (3) MG/3ML IN SOLN: 3.0000 mL | Freq: Four times a day (QID) | RESPIRATORY_TRACT | Status: DC | PRN

## 2017-11-27 MED ORDER — METHYLPREDNISOLONE 4 MG PO TABS: 8.0000 mg | ORAL_TABLET | Freq: Every day | ORAL | Status: DC

## 2017-11-27 MED ORDER — BISACODYL 5 MG PO TBEC
10.0000 mg | DELAYED_RELEASE_TABLET | Freq: Once | ORAL | Status: AC
  Administered 2017-11-27: 10 mg via ORAL
  Filled 2017-11-27: qty 2

## 2017-11-27 MED ORDER — METHYLPREDNISOLONE 4 MG PO TABS: 4.0000 mg | ORAL_TABLET | Freq: Every day | ORAL | Status: DC

## 2017-11-27 MED ORDER — INSULIN GLARGINE 100 UNIT/ML ~~LOC~~ SOLN
38.0000 [IU] | Freq: Every day | SUBCUTANEOUS | Status: DC
  Administered 2017-11-27 – 2017-12-10 (×14): 38 [IU] via SUBCUTANEOUS
  Filled 2017-11-27 (×16): qty 0.38

## 2017-11-27 MED ORDER — METHYLPREDNISOLONE 16 MG PO TABS
16.0000 mg | ORAL_TABLET | Freq: Every day | ORAL | Status: DC
  Filled 2017-11-27: qty 1

## 2017-11-27 MED ORDER — METHYLPREDNISOLONE 16 MG PO TABS
20.0000 mg | ORAL_TABLET | Freq: Every day | ORAL | Status: AC
  Filled 2017-11-27: qty 1

## 2017-11-27 NOTE — Plan of Care (Signed)
Progressing

## 2017-11-27 NOTE — Progress Notes (Signed)
PROGRESS NOTE    [name]  HQI:[id number] DOB: [birth year] DOA: 11/20/2017 PCP: Sandi Mariscal, MD   Brief Narrative:  42 year old morbidly obese female with history of Cushing's disease, diabetes on insulin, depression, hypertension, OSA who was admitted on 3/13 to ICU for intestinal drug overdose.  Patient took extra pills of baclofen, trazodone and tramadol.  Patient became encephalopathic and had generalized seizure in ER therefore patient was intubated and admitted by pulmonary critical care.  Patient was evaluated by psychiatrist who recommended inpatient psych on discharge.  Started on diuretics for pulmonary congestion.  Transferred to [facility] on 11/24/2017.  Pending inpatient psych placement.   Assessment & Plan   Intentional overdose with suicidal ideation -required intubation and is now extubated -psychiatry consulted and appreciated, recommended inpatient psych admission -Currently has safety sitter -Continue wellbutrin, neurontin, atarax, trazodone PRN -mood appears to be stable -currently medically stable for discharge  Hoarseness and mild stridor -ENT, Dr. Redmond Baseman, consulted and appreciated- s/p transnasal fiberoptic laryngoscopy: posterior vocal folds have irregular inflammatory changes at the vocal processes. -patient was given 2 doses of IV decadron -able to swallow well, no respiratory compromise -discussed with Dr. Redmond Baseman, recommended medrol dose pack and outpatient follow up   Acute toxic encephalopathy -with inability to protect airway/in the setting of intentional overdose requiring intubation -drug screen negative -CT head unremarkable  -Improved, appears to be at baseline  Acute pulmonary edema -Improved, was on IV lasix -will order repeat CXR for the morning  Shortness of breath and chest pain -Chest x-ray, EKG, troponin, d-dimer are unremarkable -Most likely contributed by anxiety which is improved now.  Hypotension -Resolved, patient has a history of  hypertension -Continue to monitor   Acute kidney injury and hyperkalemia -Resolved  Hypomagnesemia -resolved with replacement  Seizure disorder -currently stable  Anxiety/depression -needs inpatient psych admission -medications as above  Diabetes mellitus, type II with hyperglycemia -currently on steroids -continue Lantus, will increase dose -continue ISS and premeal insulin (increased dose)  DVT Prophylaxis  lovenox  Code Status: Full  Family Communication: family at bedside  Disposition Plan: Admitted, pending [facility] admission  Consultants PCCM Psychiatry  ENT  Procedures  Intubation/extubation Renal US transnasal fiberoptic laryngoscopy  Antibiotics   Anti-infectives (From admission, onward)   None      Subjective:   [name] seen and examined today.  Wants to go home. Wonders if she would be discharged from psychiatry by 12/05/17. Denies current chest pain, shortness of breath, abdominal pain, N/V/D/C.  Objective:   Vitals:   11/27/17 0111 11/27/17 0603 11/27/17 1548 11/27/17 1550  BP:  121/68  132/66  Pulse:  97  91  Resp:  18  (!) 32  Temp:  99.4 F (37.4 C)    TempSrc:  Oral    SpO2: 97% 93% 97% 98%  Weight:  (!) 140.1 kg (308 lb 14.4 oz)    Height:        Intake/Output Summary (Last 24 hours) at 11/27/2017 1631 Last data filed at 11/27/2017 0529 Gross per 24 hour  Intake 400 ml  Output -  Net 400 ml   Filed Weights   11/25/17 1100 11/26/17 0700 11/27/17 0603  Weight: (!) 138.7 kg (305 lb 12.5 oz) (!) 138.9 kg (306 lb 3.2 oz) (!) 140.1 kg (308 lb 14.4 oz)    Exam  General: Well developed, well nourished, NAD, appears stated age  HEENT: NCAT, mucous membranes moist.   Neck: Supple, voice hoarse  Cardiovascular: S1 S2 auscultated, no rubs, murmurs  or gallops. Regular rate and rhythm.  Respiratory: Clear to auscultation bilaterally with equal chest rise  Abdomen: Soft, obese, nontender, nondistended, + bowel  sounds  Extremities: warm dry without cyanosis clubbing or edema  Neuro: AAOx3, nonfocal  Psych: Depressed    Data Reviewed: I have personally reviewed following labs and imaging studies  CBC: Recent Labs  Lab 11/21/17 0543 11/23/17 0350 11/24/17 0448 11/26/17 0520  WBC 9.7 12.5* 12.4* 12.2*  NEUTROABS 9.1*  --   --   --   HGB 13.5 13.1 13.5 13.5  HCT 41.1 41.3 42.3 42.3  MCV 89.7 91.0 89.8 91.2  PLT 322 344 310 878   Basic Metabolic Panel: Recent Labs  Lab 11/20/17 1819 11/21/17 0543 11/21/17 1752  11/23/17 0350 11/24/17 0448 11/25/17 0733 11/26/17 0520 11/27/17 0220 11/27/17 0756  NA 138 137  --    < > 142 140 138 138 136 138  K 4.7 5.3*  --    < > 4.0 4.0 3.9 4.6 4.8 4.7  CL 108 106  --    < > 108 103 101 102 100* 101  CO2 19* 20*  --    < > 22 23 24 23 26 29   GLUCOSE 211* 326*  --    < > 207* 225* 290* 317* 338* 350*  BUN 24* 24*  --    < > 20 25* 25* 24* 19 18  CREATININE 1.57* 1.23*  --    < > 0.84 1.00 0.87 0.91 0.80 0.79  CALCIUM 8.7* 9.0  --    < > 9.7 9.4 9.3 9.5 9.8 9.9  MG 1.7 2.1 1.9  --  1.4* 1.6* 1.7 1.6*  --  1.9  PHOS 3.2 2.7 2.4*  --   --   --   --   --   --   --    < > = values in this interval not displayed.   GFR: Estimated Creatinine Clearance: 118.6 mL/min (by C-G formula based on SCr of 0.79 mg/dL). Liver Function Tests: Recent Labs  Lab 11/20/17 1819  ALBUMIN 3.3*   No results for input(s): LIPASE, AMYLASE in the last 168 hours. No results for input(s): AMMONIA in the last 168 hours. Coagulation Profile: No results for input(s): INR, PROTIME in the last 168 hours. Cardiac Enzymes: Recent Labs  Lab 11/25/17 1220  TROPONINI <0.03   BNP (last 3 results) No results for input(s): PROBNP in the last 8760 hours. HbA1C: No results for input(s): HGBA1C in the last 72 hours. CBG: Recent Labs  Lab 11/26/17 1159 11/26/17 1603 11/26/17 2138 11/27/17 0854 11/27/17 1152  GLUCAP 328* 319* 307* 362* 333*   Lipid Profile: No  results for input(s): CHOL, HDL, LDLCALC, TRIG, CHOLHDL, LDLDIRECT in the last 72 hours. Thyroid Function Tests: No results for input(s): TSH, T4TOTAL, FREET4, T3FREE, THYROIDAB in the last 72 hours. Anemia Panel: No results for input(s): VITAMINB12, FOLATE, FERRITIN, TIBC, IRON, RETICCTPCT in the last 72 hours. Urine analysis:    Component Value Date/Time   COLORURINE RED (A) 10/31/2016 2130   APPEARANCEUR CLOUDY (A) 10/31/2016 2130   LABSPEC 1.027 10/31/2016 2130   PHURINE 6.0 10/31/2016 2130   GLUCOSEU >=500 (A) 10/31/2016 2130   HGBUR LARGE (A) 10/31/2016 2130   BILIRUBINUR NEGATIVE 10/31/2016 2130   KETONESUR NEGATIVE 10/31/2016 2130   PROTEINUR 100 (A) 10/31/2016 2130   UROBILINOGEN 0.2 04/09/2014 1157   NITRITE NEGATIVE 10/31/2016 2130   LEUKOCYTESUR NEGATIVE 10/31/2016 2130   Sepsis Labs: @LABRCNTIP (procalcitonin:4,lacticidven:4)  )  Recent Results (from the past 240 hour(s))  MRSA PCR Screening     Status: None   Collection Time: 11/20/17  5:13 AM  Result Value Ref Range Status   MRSA by PCR NEGATIVE NEGATIVE Final    Comment:        The GeneXpert MRSA Assay (FDA approved for NASAL specimens only), is one component of a comprehensive MRSA colonization surveillance program. It is not intended to diagnose MRSA infection nor to guide or monitor treatment for MRSA infections. Performed at [facility], [place] [address] [place], [place] [address]       Radiology Studies: Dg Chest Port 1 View  Result Date: 11/26/2017 CLINICAL DATA:  Shortness of breath EXAM: PORTABLE CHEST 1 VIEW COMPARISON:  11/25/2017, 11/20/2017, 12/12/2015 FINDINGS: Cardiomegaly. No pleural effusion or focal consolidation. No pneumothorax. IMPRESSION: Cardiomegaly.  Negative for edema or focal pulmonary infiltrate. Electronically Signed   By: Donavan Foil M.D.   On: 11/26/2017 00:00     Scheduled Meds: . buPROPion  100 mg Oral BID  . cholestyramine  4 g Oral QODAY  . enoxaparin  (LOVENOX) injection  60 mg Subcutaneous Q24H  . gabapentin  300 mg Oral BID  . insulin aspart  0-20 Units Subcutaneous TID WC  . insulin aspart  0-5 Units Subcutaneous QHS  . insulin aspart  8 Units Subcutaneous TID WC  . insulin glargine  38 Units Subcutaneous QHS  . ipratropium-albuterol  3 mL Nebulization Q6H  . levothyroxine  75 mcg Oral QAC breakfast  . [START ON 11/28/2017] methylPREDNISolone  20 mg Oral Q breakfast   Followed by  . [START ON 11/29/2017] methylPREDNISolone  16 mg Oral Q breakfast   Followed by  . [START ON 11/30/2017] methylPREDNISolone  12 mg Oral Q breakfast   Followed by  . [START ON 12/01/2017] methylPREDNISolone  8 mg Oral Q breakfast   Followed by  . [START ON 12/02/2017] methylPREDNISolone  4 mg Oral Q breakfast  . pantoprazole  40 mg Oral BID  . protein supplement shake  11 oz Oral BID BM   Continuous Infusions: . sodium chloride Stopped (11/22/17 1352)     LOS: 7 days   Time Spent in minutes   30 minutes  [name] D.O. on 11/27/2017 at 4:31 PM  Between 7am to 7pm - Pager - [phone]  After 7pm go to [email or web address] - password [email or web address]  And look for the night coverage person covering for me after hours  [facility] Office  [phone]

## 2017-11-27 NOTE — Progress Notes (Signed)
[facility] coordinator going to call CSW back to take referral info.   Percell Locus Traivon Morrical LCSW [phone]

## 2017-11-27 NOTE — Progress Notes (Signed)
Inpatient Diabetes Program Recommendations  AACE/ADA: New Consensus Statement on Inpatient Glycemic Control (2015)  Target Ranges:  Prepandial:   less than 140 mg/dL      Peak postprandial:   less than 180 mg/dL (1-2 hours)      Critically ill patients:  140 - 180 mg/dL   Lab Results  Component Value Date   GLUCAP 333 (H) 11/27/2017   HGBA1C 7.2 (H) 11/20/2017    Review of Glycemic Control  Blood sugars in 300s. Needs insulin adjustment. Steroids tapering although blood sugars extremely elevated.  Inpatient Diabetes Program Recommendations:     Increase Lantus to 38 units QHS Increase Novolog to 8 units tidwc for meal coverage insulin  Continue to follow closely.  Thank you. Lorenda Peck, RD, LDN, CDE Inpatient Diabetes Coordinator [phone]

## 2017-11-28 ENCOUNTER — Encounter (INDEPENDENT_AMBULATORY_CARE_PROVIDER_SITE_OTHER): Payer: Self-pay | Admitting: *Deleted

## 2017-11-28 ENCOUNTER — Inpatient Hospital Stay (HOSPITAL_COMMUNITY): Payer: Medicaid Other

## 2017-11-28 ENCOUNTER — Inpatient Hospital Stay (HOSPITAL_COMMUNITY): Payer: Medicaid Other | Admitting: Anesthesiology

## 2017-11-28 ENCOUNTER — Other Ambulatory Visit: Payer: Medicaid Other

## 2017-11-28 ENCOUNTER — Encounter (HOSPITAL_COMMUNITY)
Admission: EM | Disposition: A | Discharge status: Other Acute Inpatient Hospital | Payer: Self-pay | Source: Home / Self Care | Attending: Internal Medicine

## 2017-11-28 DIAGNOSIS — Z930 Tracheostomy status: Secondary | ICD-10-CM

## 2017-11-28 DIAGNOSIS — R0602 Shortness of breath: Secondary | ICD-10-CM

## 2017-11-28 DIAGNOSIS — F332 Major depressive disorder, recurrent severe without psychotic features: Secondary | ICD-10-CM

## 2017-11-28 DIAGNOSIS — G40901 Epilepsy, unspecified, not intractable, with status epilepticus: Secondary | ICD-10-CM

## 2017-11-28 DIAGNOSIS — J9602 Acute respiratory failure with hypercapnia: Secondary | ICD-10-CM

## 2017-11-28 DIAGNOSIS — J9620 Acute and chronic respiratory failure, unspecified whether with hypoxia or hypercapnia: Secondary | ICD-10-CM

## 2017-11-28 DIAGNOSIS — R34 Anuria and oliguria: Secondary | ICD-10-CM

## 2017-11-28 DIAGNOSIS — R490 Dysphonia: Secondary | ICD-10-CM

## 2017-11-28 DIAGNOSIS — T50902A Poisoning by unspecified drugs, medicaments and biological substances, intentional self-harm, initial encounter: Secondary | ICD-10-CM

## 2017-11-28 DIAGNOSIS — J9601 Acute respiratory failure with hypoxia: Secondary | ICD-10-CM

## 2017-11-28 HISTORY — PX: TRACHEOSTOMY TUBE PLACEMENT: SHX814

## 2017-11-28 LAB — GLUCOSE, CAPILLARY
Glucose-Capillary: 306 mg/dL — ABNORMAL HIGH (ref 65–99)
Glucose-Capillary: 360 mg/dL — ABNORMAL HIGH (ref 65–99)
Glucose-Capillary: 367 mg/dL — ABNORMAL HIGH (ref 65–99)
Glucose-Capillary: 255 mg/dL — ABNORMAL HIGH (ref 65–99)

## 2017-11-28 LAB — MRSA PCR SCREENING: MRSA by PCR: NEGATIVE

## 2017-11-28 SURGERY — CREATION, TRACHEOSTOMY
Anesthesia: General | Site: Neck

## 2017-11-28 MED ORDER — SUGAMMADEX SODIUM 200 MG/2ML IV SOLN
INTRAVENOUS | Status: DC | PRN
  Administered 2017-11-28: 300 mg via INTRAVENOUS

## 2017-11-28 MED ORDER — LIDOCAINE HCL 2 % EX GEL
1.0000 "application " | Freq: Once | CUTANEOUS | Status: DC | PRN
  Filled 2017-11-28: qty 5

## 2017-11-28 MED ORDER — ONDANSETRON HCL 4 MG/2ML IJ SOLN
INTRAMUSCULAR | Status: DC | PRN
  Administered 2017-11-28: 4 mg via INTRAVENOUS

## 2017-11-28 MED ORDER — FENTANYL CITRATE (PF) 250 MCG/5ML IJ SOLN
INTRAMUSCULAR | Status: AC
  Filled 2017-11-28: qty 5

## 2017-11-28 MED ORDER — PROPOFOL 10 MG/ML IV BOLUS
INTRAVENOUS | Status: AC
Start: 2017-11-28 — End: ?
  Filled 2017-11-28: qty 20

## 2017-11-28 MED ORDER — SUCCINYLCHOLINE CHLORIDE 20 MG/ML IJ SOLN
INTRAMUSCULAR | Status: DC | PRN
  Administered 2017-11-28: 100 mg via INTRAVENOUS

## 2017-11-28 MED ORDER — PROPOFOL 500 MG/50ML IV EMUL
INTRAVENOUS | Status: DC | PRN
  Administered 2017-11-28: 50 ug/kg/min via INTRAVENOUS

## 2017-11-28 MED ORDER — DEXAMETHASONE SODIUM PHOSPHATE 10 MG/ML IJ SOLN
INTRAMUSCULAR | Status: AC
  Filled 2017-11-28: qty 1

## 2017-11-28 MED ORDER — RACEPINEPHRINE HCL 2.25 % IN NEBU
0.5000 mL | INHALATION_SOLUTION | Freq: Once | RESPIRATORY_TRACT | Status: DC
  Filled 2017-11-28: qty 0.5

## 2017-11-28 MED ORDER — PHENYLEPHRINE 40 MCG/ML (10ML) SYRINGE FOR IV PUSH (FOR BLOOD PRESSURE SUPPORT)
PREFILLED_SYRINGE | INTRAVENOUS | Status: DC | PRN
  Administered 2017-11-28: 80 ug via INTRAVENOUS

## 2017-11-28 MED ORDER — FUROSEMIDE 10 MG/ML IJ SOLN
40.0000 mg | Freq: Three times a day (TID) | INTRAMUSCULAR | Status: AC
  Administered 2017-11-28 (×2): 40 mg via INTRAVENOUS
  Filled 2017-11-28 (×2): qty 4

## 2017-11-28 MED ORDER — MORPHINE SULFATE (PF) 2 MG/ML IV SOLN
2.0000 mg | INTRAVENOUS | Status: AC
  Administered 2017-11-28: 2 mg via INTRAVENOUS
  Filled 2017-11-28: qty 1

## 2017-11-28 MED ORDER — DEXAMETHASONE SODIUM PHOSPHATE 10 MG/ML IJ SOLN
8.0000 mg | Freq: Once | INTRAMUSCULAR | Status: AC
  Administered 2017-11-28: 8 mg via INTRAVENOUS

## 2017-11-28 MED ORDER — FENTANYL CITRATE (PF) 100 MCG/2ML IJ SOLN: 25.0000 ug | INTRAMUSCULAR | Status: DC | PRN

## 2017-11-28 MED ORDER — LIDOCAINE-EPINEPHRINE 1 %-1:100000 IJ SOLN
INTRAMUSCULAR | Status: AC
  Filled 2017-11-28: qty 1

## 2017-11-28 MED ORDER — FENTANYL CITRATE (PF) 250 MCG/5ML IJ SOLN
INTRAMUSCULAR | Status: DC | PRN
  Administered 2017-11-28: 50 ug via INTRAVENOUS
  Administered 2017-11-28 (×2): 25 ug via INTRAVENOUS

## 2017-11-28 MED ORDER — OXYMETAZOLINE HCL 0.05 % NA SOLN
1.0000 | Freq: Once | NASAL | Status: DC | PRN
Start: 2017-11-28 — End: 2017-12-04
  Filled 2017-11-28: qty 15

## 2017-11-28 MED ORDER — ROCURONIUM BROMIDE 10 MG/ML (PF) SYRINGE
PREFILLED_SYRINGE | INTRAVENOUS | Status: DC | PRN
  Administered 2017-11-28: 40 mg via INTRAVENOUS

## 2017-11-28 MED ORDER — TRIPLE ANTIBIOTIC 3.5-400-5000 EX OINT
1.0000 "application " | TOPICAL_OINTMENT | Freq: Once | CUTANEOUS | Status: DC | PRN
  Filled 2017-11-28: qty 1

## 2017-11-28 MED ORDER — FENTANYL CITRATE (PF) 100 MCG/2ML IJ SOLN
50.0000 ug | INTRAMUSCULAR | Status: DC | PRN
  Administered 2017-11-28 – 2017-12-05 (×38): 50 ug via INTRAVENOUS
  Filled 2017-11-28 (×38): qty 2

## 2017-11-28 MED ORDER — 0.9 % SODIUM CHLORIDE (POUR BTL) OPTIME
TOPICAL | Status: DC | PRN
  Administered 2017-11-28: 1000 mL

## 2017-11-28 MED ORDER — SILVER NITRATE-POT NITRATE 75-25 % EX MISC: 1.0000 | Freq: Once | CUTANEOUS | Status: DC | PRN

## 2017-11-28 MED ORDER — LIDOCAINE-EPINEPHRINE (PF) 1 %-1:200000 IJ SOLN
0.0000 mL | Freq: Once | INTRAMUSCULAR | Status: DC | PRN
  Filled 2017-11-28: qty 30

## 2017-11-28 MED ORDER — DIPHENHYDRAMINE HCL 50 MG/ML IJ SOLN
INTRAMUSCULAR | Status: AC
  Filled 2017-11-28: qty 1

## 2017-11-28 MED ORDER — LIDOCAINE HCL (PF) 4 % IJ SOLN
4.0000 mL | Freq: Once | INTRAMUSCULAR | Status: DC
  Filled 2017-11-28 (×2): qty 5

## 2017-11-28 MED ORDER — MIDAZOLAM HCL 2 MG/2ML IJ SOLN
INTRAMUSCULAR | Status: DC | PRN
  Administered 2017-11-28: 2 mg via INTRAVENOUS

## 2017-11-28 MED ORDER — METHYLPREDNISOLONE SODIUM SUCC 125 MG IJ SOLR
INTRAMUSCULAR | Status: AC
  Filled 2017-11-28: qty 2

## 2017-11-28 MED ORDER — DIPHENHYDRAMINE HCL 50 MG/ML IJ SOLN
25.0000 mg | Freq: Once | INTRAMUSCULAR | Status: AC
  Administered 2017-11-28: 25 mg via INTRAVENOUS

## 2017-11-28 MED ORDER — MIDAZOLAM HCL 2 MG/2ML IJ SOLN
INTRAMUSCULAR | Status: AC
Start: 2017-11-28 — End: ?
  Filled 2017-11-28: qty 2

## 2017-11-28 MED ORDER — RACEPINEPHRINE HCL 2.25 % IN NEBU
0.5000 mL | INHALATION_SOLUTION | Freq: Once | RESPIRATORY_TRACT | Status: AC
  Administered 2017-11-28: 0.5 mL via RESPIRATORY_TRACT
  Filled 2017-11-28: qty 0.5

## 2017-11-28 MED ORDER — DEXAMETHASONE SODIUM PHOSPHATE 10 MG/ML IJ SOLN
INTRAMUSCULAR | Status: DC | PRN
  Administered 2017-11-28: 4 mg via INTRAVENOUS

## 2017-11-28 MED ORDER — LIDOCAINE HCL 4 % EX SOLN
0.0000 mL | Freq: Once | CUTANEOUS | Status: DC | PRN
  Filled 2017-11-28: qty 50

## 2017-11-28 SURGICAL SUPPLY — 44 items
APL SKNCLS STERI-STRIP NONHPOA (GAUZE/BANDAGES/DRESSINGS) ×1
BENZOIN TINCTURE PRP APPL 2/3 (GAUZE/BANDAGES/DRESSINGS) ×2 IMPLANT
BLADE CLIPPER SURG (BLADE) IMPLANT
BLADE SURG 15 STRL LF DISP TIS (BLADE) IMPLANT
BLADE SURG 15 STRL SS (BLADE) ×3
CANISTER SUCT 3000ML PPV (MISCELLANEOUS) ×3 IMPLANT
CLEANER TIP ELECTROSURG 2X2 (MISCELLANEOUS) ×3 IMPLANT
CLOSURE STERI-STRIP 1/2X4 (GAUZE/BANDAGES/DRESSINGS) ×1
CLSR STERI-STRIP ANTIMIC 1/2X4 (GAUZE/BANDAGES/DRESSINGS) ×1 IMPLANT
COVER SURGICAL LIGHT HANDLE (MISCELLANEOUS) ×1 IMPLANT
DECANTER SPIKE VIAL GLASS SM (MISCELLANEOUS) ×3 IMPLANT
DRAPE HALF SHEET 40X57 (DRAPES) IMPLANT
ELECT COATED BLADE 2.86 ST (ELECTRODE) ×3 IMPLANT
ELECT REM PT RETURN 9FT ADLT (ELECTROSURGICAL) ×3
ELECTRODE REM PT RTRN 9FT ADLT (ELECTROSURGICAL) ×1 IMPLANT
GAUZE SPONGE 4X4 12PLY STRL LF (GAUZE/BANDAGES/DRESSINGS) ×2 IMPLANT
GAUZE SPONGE 4X4 16PLY XRAY LF (GAUZE/BANDAGES/DRESSINGS) ×3 IMPLANT
GLOVE BIOGEL PI IND STRL 6.5 (GLOVE) IMPLANT
GLOVE BIOGEL PI INDICATOR 6.5 (GLOVE) ×2
GLOVE ECLIPSE 7.5 STRL STRAW (GLOVE) ×5 IMPLANT
GOWN STRL REUS W/ TWL LRG LVL3 (GOWN DISPOSABLE) ×2 IMPLANT
GOWN STRL REUS W/TWL LRG LVL3 (GOWN DISPOSABLE) ×6
HOLDER TRACH TUBE VELCRO 19.5 (MISCELLANEOUS) ×4 IMPLANT
KIT BASIN OR (CUSTOM PROCEDURE TRAY) ×3 IMPLANT
KIT ROOM TURNOVER OR (KITS) ×3 IMPLANT
NDL PRECISIONGLIDE 27X1.5 (NEEDLE) ×1 IMPLANT
NEEDLE PRECISIONGLIDE 27X1.5 (NEEDLE) ×3 IMPLANT
NS IRRIG 1000ML POUR BTL (IV SOLUTION) ×3 IMPLANT
PACK EENT II TURBAN DRAPE (CUSTOM PROCEDURE TRAY) ×3 IMPLANT
PAD ARMBOARD 7.5X6 YLW CONV (MISCELLANEOUS) ×6 IMPLANT
PENCIL FOOT CONTROL (ELECTRODE) ×5 IMPLANT
SUT CHROMIC 2 0 SH (SUTURE) ×3 IMPLANT
SUT ETHILON 2 0 FS 18 (SUTURE) ×6 IMPLANT
SUT ETHILON 3 0 PS 1 (SUTURE) ×3 IMPLANT
SUT SILK 4 0 (SUTURE)
SUT SILK 4 0 TIE 10X30 (SUTURE) ×1 IMPLANT
SUT SILK 4-0 18XBRD TIE 12 (SUTURE) ×1 IMPLANT
SYR 20CC LL (SYRINGE) ×3 IMPLANT
SYR CONTROL 10ML LL (SYRINGE) ×2 IMPLANT
TOWEL OR 17X24 6PK STRL BLUE (TOWEL DISPOSABLE) ×3 IMPLANT
TUBE CONNECTING 12'X1/4 (SUCTIONS) ×2
TUBE CONNECTING 12X1/4 (SUCTIONS) ×3 IMPLANT
TUBE TRACH ADJ TTS 7MM (TUBING) ×2 IMPLANT
WATER STERILE IRR 1000ML POUR (IV SOLUTION) ×3 IMPLANT

## 2017-11-28 NOTE — Significant Event (Addendum)
Rapid Response Event Note  Overview: Stridor/Respiratory   Initial Focused Assessment: RT called me earlier to inform me that patient was experiencing stridor, per RT, orders already received for race-epi. RT went and assessed patient and assisted the RN. I was with another patient, once I was finished, I called the RN for a follow up and as I was walking, she informed me patient was still having some stridor.  I arrived at 255, patient was standing up by the window, I asked to her sit in the bed, lung sounds were clear in the uppers and diminished in the lowers, + stridor.  I asked RT to come and assess the patient, per RT and RN, patient has improved from earlier.  Not in acute distress, no SOB or increased WOB, protecting airway, able to swallow (+sore throat), face was red/looks swollen (history of Cushing's),  + cough. O2 sats > 95% on RA.   Interventions: -- Morphine 2mg  IV now  Plan of Care: -- RN to follow up with [facility] NP  Event Summary:   at    Call Time 300 End Time  330  At 447, RNs called back that patient was still having stridor and RN felt like patient had more facial swelling. I came and assessed the patient, patient was not is respiratory distress, however is more anxious now. No increased work of breathing, lung sounds were still clear in the upper and diminished in the lowers, good air movement bilaterally, upper airway is noisy but sounds better than before. Patient still endorses a sore throat and pain in her throat.  [facility] NP and ENT were called, per [facility] NP will give Benadryl 25mg  IV now and per ENT will give 8mg  Decadron IV, per ENT recommendation SDU level of care.  Reviewed plan with [facility] NP, for now per [facility] NP will monitor patient as current status with continuous pulse ox.  [facility] NP will reach out to RT for lidocaine neb as well. ENT ordered supplies and will see patient this morning. Sats maintaining > 94% on RA. I encouraged the patient to rest, she has not been to rest.  Patient also applied her home CPAP well for comfort, she was able to lay back in bed, and was able to rest. Patient stated she felt better as well.  Call Time 447 End Time 0600

## 2017-11-28 NOTE — Progress Notes (Signed)
11/28/2017 7:00 AM  Pflug, [name] [id number]     Temp:  [97.4 F (36.3 C)-98.1 F (36.7 C)] 97.4 F (36.3 C) (03/21 0527) Pulse Rate:  [91-107] 103 (03/21 0527) Resp:  [22-32] 26 (03/21 0527) BP: (125-137)/(49-66) 125/49 (03/21 0527) SpO2:  [92 %-98 %] 92 % (03/21 0527) FiO2 (%):  [28 %] 28 % (03/21 0148),     Intake/Output Summary (Last 24 hours) at 11/28/2017 0700 Last data filed at 11/27/2017 1846 Gross per 24 hour  Intake 180 ml  Output -  Net 180 ml    Results for orders placed or performed during the hospital encounter of 11/20/17 (from the past 24 hour(s))  Basic metabolic panel     Status: Abnormal   Collection Time: 11/27/17  7:56 AM  Result Value Ref Range   Sodium 138 135 - 145 mmol/L   Potassium 4.7 3.5 - 5.1 mmol/L   Chloride 101 101 - 111 mmol/L   CO2 29 22 - 32 mmol/L   Glucose, Bld 350 (H) 65 - 99 mg/dL   BUN 18 6 - 20 mg/dL   Creatinine, Ser 0.79 0.44 - 1.00 mg/dL   Calcium 9.9 8.9 - 10.3 mg/dL   GFR calc non Af Amer >60 >60 mL/min   GFR calc Af Amer >60 >60 mL/min   Anion gap 8 5 - 15  Magnesium     Status: None   Collection Time: 11/27/17  7:56 AM  Result Value Ref Range   Magnesium 1.9 1.7 - 2.4 mg/dL  Glucose, capillary     Status: Abnormal   Collection Time: 11/27/17  8:54 AM  Result Value Ref Range   Glucose-Capillary 362 (H) 65 - 99 mg/dL  Glucose, capillary     Status: Abnormal   Collection Time: 11/27/17 11:52 AM  Result Value Ref Range   Glucose-Capillary 333 (H) 65 - 99 mg/dL  Glucose, capillary     Status: Abnormal   Collection Time: 11/27/17  4:49 PM  Result Value Ref Range   Glucose-Capillary 246 (H) 65 - 99 mg/dL  Glucose, capillary     Status: Abnormal   Collection Time: 11/27/17  9:11 PM  Result Value Ref Range   Glucose-Capillary 283 (H) 65 - 99 mg/dL    SUBJECTIVE:  Called by nurses for increasing stridor and subjective labored breathing at 0500.  Pt also c/o sore throat.  Seen by Dr. Redmond Baseman 2 days ago with similar  complaints which resolved with racemic epi.  This AM, racemic epi given but minimal improvement.    OBJECTIVE:  Obese.  Sitting up at bedside.  Definite stridor.    Ant nose sl dry.  OC clear.  OP clear. No erythema or exudate. Neck obese but no tenderness nor induration.  With informed consent, using 2% xylocaine viscous per R nare, flexible laryngoscopy shows posterior supraglottic swelling.  Vocal cords swollen and do not appear to abduct at all.  Could not see all the way to the posterior commissure.  Airway tenuous. Pt tolerated procedure well.    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    PLAN:  Will need direct laryngoscopy in OR.  May have posterior commissure fixation/ or even vocal cord paralysis.  I will ask Dr. Redmond Baseman to continue her care.  If she deteriorates, may need to be re-intubated.    [name]

## 2017-11-28 NOTE — Progress Notes (Signed)
Patient in acute respiratory distress. Placed on Bipap by RT.  Dr Ree Kida at bedside.  Anesthesia to bedside.  Assisted with transport to OR.

## 2017-11-28 NOTE — Transfer of Care (Signed)
Immediate Anesthesia Transfer of Care Note  Patient: [name]  Procedure(s) Performed: TRACHEOSTOMY (N/A Neck)  Patient Location: ICU  Anesthesia Type:General  Level of Consciousness: awake, alert , oriented and patient cooperative  Airway & Oxygen Therapy: Patient Spontanous Breathing and Patient connected to tracheostomy mask oxygen  Post-op Assessment: Report given to RN, Post -op Vital signs reviewed and stable and Patient moving all extremities X 4  Post vital signs: Reviewed and stable  Last Vitals:  Vitals Value Taken Time  BP 161/76 11/28/2017 11:06 AM  Temp    Pulse 104 11/28/2017 11:08 AM  Resp 34 11/28/2017 11:08 AM  SpO2 90 % 11/28/2017 11:08 AM  Vitals shown include unvalidated device data.  Last Pain:  Vitals:   11/28/17 0527  TempSrc: Oral  PainSc:       Patients Stated Pain Goal: 1 (23/30/07 6226)  Complications: No apparent anesthesia complications   Patient transported to ICU with standard monitors (HR, BP, SPO2, RR) and emergency drugs/equipment. Report given to bedside RN and respiratory therapist. Pt connected to ICU monitor. All questions answered and vital signs stable before leaving.

## 2017-11-28 NOTE — Anesthesia Preprocedure Evaluation (Signed)
Anesthesia Evaluation  Patient identified by MRN, date of birth, ID band Patient awake    Reviewed: Allergy & Precautions, H&P , Patient's Chart, lab work & pertinent test results, reviewed documented beta blocker date and time   Airway Mallampati: IV  TM Distance: >3 FB Neck ROM: full    Dental no notable dental hx.    Pulmonary shortness of breath, sleep apnea , Current Smoker,    Pulmonary exam normal breath sounds clear to auscultation       Cardiovascular hypertension,  Rhythm:regular Rate:Normal     Neuro/Psych Seizures -,  PSYCHIATRIC DISORDERS Anxiety Depression    GI/Hepatic   Endo/Other  diabetesHypothyroidism   Renal/GU      Musculoskeletal  (+) Arthritis ,   Abdominal   Peds  Hematology   Anesthesia Other Findings   Reproductive/Obstetrics                             Anesthesia Physical  Anesthesia Plan  ASA: IV and emergent  Anesthesia Plan: General   Post-op Pain Management:    Induction: Intravenous  PONV Risk Score and Plan: 2 and Ondansetron and Dexamethasone  Airway Management Planned: Oral ETT  Additional Equipment:   Intra-op Plan:   Post-operative Plan: Extubation in OR  Informed Consent: I have reviewed the patients History and Physical, chart, labs and discussed the procedure including the risks, benefits and alternatives for the proposed anesthesia with the patient or authorized representative who has indicated his/her understanding and acceptance.   Dental Advisory Given  Plan Discussed with: CRNA  Anesthesia Plan Comments:         Anesthesia Quick Evaluation

## 2017-11-28 NOTE — Plan of Care (Signed)
RT found pt in distress- audible stridor. Pt placed on cool aerosol, Nurse & Rapid Response aware, MD paged. Per MD Racemic Epi neb given. Stridor and WOB decreased. RT will continue to monitor.

## 2017-11-28 NOTE — Progress Notes (Signed)
Pt breathing became labored with audible stridor.  Respiratory was paged .  On arrival, RT stated that neb treatment will not help that pt needed racemic Epi.  NP on call paged and an order for racemic epi received.  RT administered med and placed pt on cool aerosol.  Stridor and work of breathing improved.  After about one plus hour patient started experiencing difficulty breathing increased work of breathing with stridor. Lungs fields are clear but diminished bilaterally. Oxygen saturation at 93%. Rapid Response called and at bedside.  NP paged and an order for benadryl received.  NP noted that at this time, we have done all we could do and instructed RN to called ENT.  RN called Dr. Erik Obey who came and performed laryngoscopy at bedside. Asked to transfer pt to step down unit' and keep her NPO.  Dr. Areatha Keas was notified and an order to transfer pt to progressive care received.  Patient is kept NPO and progressive monitor applied,  Will continue to  Monitor.

## 2017-11-28 NOTE — Progress Notes (Signed)
PULMONARY / CRITICAL CARE MEDICINE   Name: [name] MRN: [id number] DOB: 1976/05/25    ADMISSION DATE:  11/20/2017 CONSULTATION DATE:  3/13  REFERRING MD:  Dr. Christy Gentles  CHIEF COMPLAINT:  Encephalopathy/Overdose (intentional)  HISTORY OF PRESENT ILLNESS:   42 year old female with PMH as below, which is significant for cushing's disease, DM, depression, HTN, morbid obesity, and OSA. Within the past few days she has had a complicated family situation at home, which has caused a lot of distress. Otherwise her health has been in its usual state. In the evening hours of 3/12 she contacted her husband while he was working to inform him she was going to overdose. He went home and she had already taken approximately #15 10mg  Baclofen and 100mg  trazadone. She then went on to take "at least #30" 50mg  tramadol. She would not initially come to the ED. Her husband called poison control and watched her. When she became encephalopathic and tremulous she agreed to come to ED. She suffered a generalized seizure in triage and was brought back to ED where she was emergently intubated. PCCM asked to admit.   SUBJECTIVE:  Patient developed respiratory failure this AM due to severe stridor and fixed vocal cord dysfunction  VITAL SIGNS: BP (!) 161/76 (BP Location: Left Wrist)   Pulse (!) 120   Temp 99.1 F (37.3 C) (Oral)   Resp (!) 34   Ht 5\' 2"  (1.575 m)   Wt (!) 306 lb 3.5 oz (138.9 kg)   SpO2 95%   BMI 56.01 kg/m   HEMODYNAMICS:    VENTILATOR SETTINGS: FiO2 (%):  [28 %-40 %] 40 %  INTAKE / OUTPUT: I/O last 3 completed shifts: In: 50 [P.O.:580] Out: -   PHYSICAL EXAMINATION: General: Acutely ill appearing on BiPAP when evaluated pre trach but significant improvement post trach on TC, NAD HEENT: [place]/AT, PERRL, EOM-I and MMM.  Lurline Idol is in position. Neuro: Awake and interactive, moving all ext to command Cardiac: RRR, Nl S1/S2 and -M/R/G. PULM: Coarse BS diffusely post trach, severe  stridor prior to trach GI: Soft, NT, ND and +BS Extremities: -edema and -tenderness Skin: No rash  LABS:  BMET Recent Labs  Lab 11/26/17 0520 11/27/17 0220 11/27/17 0756  NA 138 136 138  K 4.6 4.8 4.7  CL 102 100* 101  CO2 23 26 29   BUN 24* 19 18  CREATININE 0.91 0.80 0.79  GLUCOSE 317* 338* 350*   Electrolytes Recent Labs  Lab 11/21/17 1752  11/25/17 0733 11/26/17 0520 11/27/17 0220 11/27/17 0756  CALCIUM  --    < > 9.3 9.5 9.8 9.9  MG 1.9   < > 1.7 1.6*  --  1.9  PHOS 2.4*  --   --   --   --   --    < > = values in this interval not displayed.   CBC Recent Labs  Lab 11/23/17 0350 11/24/17 0448 11/26/17 0520  WBC 12.5* 12.4* 12.2*  HGB 13.1 13.5 13.5  HCT 41.3 42.3 42.3  PLT 344 310 372   Coag's No results for input(s): APTT, INR in the last 168 hours.  Sepsis Markers No results for input(s): LATICACIDVEN, PROCALCITON, O2SATVEN in the last 168 hours.  ABG Recent Labs  Lab 11/22/17 0257  PHART 7.367  PCO2ART 42.5  PO2ART 95.2   Liver Enzymes No results for input(s): AST, ALT, ALKPHOS, BILITOT, ALBUMIN in the last 168 hours.  Cardiac Enzymes Recent Labs  Lab 11/25/17 1220  TROPONINI <  0.03   Glucose Recent Labs  Lab 11/26/17 2138 11/27/17 0854 11/27/17 1152 11/27/17 1649 11/27/17 2111 11/28/17 0854  GLUCAP 307* 362* 333* 246* 283* 306*   Imaging I reviewed CXR myself, mild pulmonary edema noted.  STUDIES:  CT head 3/13 > unremarkable  CULTURES:   ANTIBIOTICS:   SIGNIFICANT EVENTS: 3/13 admit after intentional overdose of baclofen and tramadol.  3/15 weaning awake and alert, strong cough >>> looks ready for extubation  LINES/TUBES: Trach 3/21>>>  DISCUSSION: 42 year old morbidly obese female with complex medical history. Overdosed intentionally intubated for airway protection, has a suicide Actuary.  Patient developed respiratory failure due to a fixed vocal cord obstruction per ENT.  She was emergently taken to the OR for  emergent trach and PCCM consulted for vent management and ICU management.  ASSESSMENT / PLAN:  PULMONARY A: Inability to protect airway in the setting of intentional overdose OSA/OHS on home CPAP  P:   TC as tolerated Titrate O2 for sat of 88-92% Two doses of lasix as ordered CXR today post trach placement  CARDIOVASCULAR A:  Hypotension - resolved  HTN P:  KVO IVF Hydralazine as needed Continue telemetry  RENAL Hyperkalemia - resolved  AKI - improved  P:   KVO IVF  Lasix 40 mg IV q8 x2 doses Follow BMP, urine output Avoid nephrotoxic medications Maintain renal perfusion MAP goal > 65  GASTROINTESTINAL A:   Gastric motility issues Takes reglan at home Vomited TF 3/14  P:   SLP now that patient is trached Hold off TF for now. PPI  HEMATOLOGIC No acute issues  P:  Heparin prophylaxis when ok with ENT since patient is post op Follow CBC  INFECTIOUS A:   No evidence for active infection  P:   Following clinically No indications for abx at this time  ENDOCRINE A:   DM Cushings Hypothyroid    P:   Sliding scale insulin and CBG as ordered Continue home Synthroid Lantus on hold since patient is NPO  NEUROLOGIC A:   Acute toxic encephalopathy - secondary to overdose of baclofen (at least 150mg  estimated) and tramadol (at least 1500mg  estimated). Co-ingestants negative>> resolved Seizure: likely secondary to overdose. None further  Depression, intentional overdose Awake and alert 3/15 P:   [facility] psychiatry assistance.  They have recommended inpatient psychiatric hospitalization given her continued suicidal ideation.   We are to continue bedside sitter.   No psychotropic medications until stable medically Holding home baclofen, tramadol, venlafaxine  FAMILY  - Updates: Patient update bedside  - Inter-disciplinary family meet or Palliative Care meeting due by:  3/20  The patient is critically ill with multiple organ systems failure and  requires high complexity decision making for assessment and support, frequent evaluation and titration of therapies, application of advanced monitoring technologies and extensive interpretation of multiple databases.   Critical Care Time devoted to patient care services described in this note is  35  Minutes. This time reflects time of care of this signee Dr Jennet Maduro. This critical care time does not reflect procedure time, or teaching time or supervisory time of PA/NP/Med student/Med Resident etc but could involve care discussion time.  Rush Farmer, M.D. [facility] Pulmonary/Critical Care Medicine. Pager: [phone]. After hours pager: [phone].

## 2017-11-28 NOTE — Progress Notes (Signed)
PROGRESS NOTE    [name]  KWI:[id number] DOB: Sep 20, 1975 DOA: 11/20/2017 PCP: Sandi Mariscal, MD   Brief Narrative:  42 year old morbidly obese female with history of Cushing's disease, diabetes on insulin, depression, hypertension, OSA who was admitted on 3/13 to ICU for intestinal drug overdose.  Patient took extra pills of baclofen, trazodone and tramadol.  Patient became encephalopathic and had generalized seizure in ER therefore patient was intubated and admitted by pulmonary critical care.  Patient was evaluated by psychiatrist who recommended inpatient psych on discharge.  Started on diuretics for pulmonary congestion.  Transferred to [facility] on 11/24/2017. Patient developed stridor and was seen by ENT, found to have irregular inflammatory vocal cord changes. ENT recommended medrol dose pack and PPI.  Overnight patient had increasing stridor. Rapid response was consulted, patient given racemic epi. 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 This morning, patient began to have more respiratory distress. Ordered BiPAP. PCCM and anesthesia at bedside for possible intubation. Plan for OR today for direct laryngoscopy and tracheostomy.  Assessment & Plan   Acute respiratory failure secondary to stridor/Glottic obstruction  -ENT, Dr. Redmond Baseman, consulted and appreciated- s/p transnasal fiberoptic laryngoscopy: posterior vocal folds have irregular inflammatory changes at the vocal processes. -patient was given 2 doses of IV decadron -As above, overnight patient developed increasing stridor.  ENT reevaluated patient with bedside laryngoscopy and found posterior supraglottic swelling.  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  Plan was for the OR today for direct laryngoscopy and possible tracheostomy. -ordered repeat dose of racemic epi and solumedrol- not sure if these were  given -Upon my evaluation this morning, patient had more respiratory distress and was placed on BiPAP.  PCCM and anesthesia were present at bedside for possible intubation.  Given the patient was going to the OR today, and body habitus, patient was taken by anesthesia to the OR for intubation. -Suspect patient will return to ICU postop. PCCM aware and appreciated.  Intentional overdose with suicidal ideation -required intubation and is now extubated -psychiatry consulted and appreciated, recommended inpatient psych admission -Currently has safety sitter -Continue wellbutrin, neurontin, atarax, trazodone PRN  Acute toxic encephalopathy -with inability to protect airway/in the setting of intentional overdose requiring intubation -drug screen negative -CT head unremarkable  -Improved, appears to be at baseline  Acute pulmonary edema -Improved, was on IV lasix -Xray cancelled due to respiratory failure  Shortness of breath and chest pain -Chest x-ray, EKG, troponin, d-dimer are unremarkable -Most likely contributed by anxiety  Hypotension -Resolved, patient has a history of hypertension -BP stable -Continue to monitor   Acute kidney injury and hyperkalemia -Resolved  Hypomagnesemia -resolved with replacement  Seizure disorder -currently stable  Anxiety/depression -needs inpatient psych admission -medications as above  Diabetes mellitus, type II with hyperglycemia -currently on steroids -continue Lantus, ISS, CBG monitoring   DVT Prophylaxis  lovenox  Code Status: Full  Family Communication: None at bedside  Disposition Plan: Admitted, pending OR for direct endoscopy and possible trach.  Likely be sent to the ICU postop.  Consultants PCCM Psychiatry  ENT  Procedures  Intubation/extubation Renal US transnasal fiberoptic laryngoscopy x 2  Antibiotics   Anti-infectives (From admission, onward)   None      Subjective:   [name] seen and  examined today.  Currently appears in respiratory distress.  States "I am done."   Objective:   Vitals:   11/27/17 2115 11/28/17 0148 11/28/17 0527 11/28/17  0848  BP: 137/61  (!) 125/49   Pulse: (!) 107  (!) 103   Resp: (!) 22  (!) 26   Temp: 98.1 F (36.7 C)  (!) 97.4 F (36.3 C)   TempSrc: Oral  Oral   SpO2: 96% 98% 92% 96%  Weight:      Height:        Intake/Output Summary (Last 24 hours) at 11/28/2017 0954 Last data filed at 11/27/2017 1846 Gross per 24 hour  Intake 180 ml  Output -  Net 180 ml   Filed Weights   11/25/17 1100 11/26/17 0700 11/27/17 0603  Weight: (!) 138.7 kg (305 lb 12.5 oz) (!) 138.9 kg (306 lb 3.2 oz) (!) 140.1 kg (308 lb 14.4 oz)   Exam  General: Well developed, well-nourished, distressed  HEENT: NCAT, mucous membranes moist.   Cardiovascular: S1 S2 auscultated, RRR, no murmur  Respiratory: Upper airway stridor noted, very limited air movement throughout lungs bilaterally  Abdomen: Soft, obese, nontender, nondistended, + bowel sounds  Extremities: warm dry without cyanosis clubbing or edema  Neuro: AAOx3, nonfocal  Psych: Anxious  Data Reviewed: I have personally reviewed following labs and imaging studies  CBC: Recent Labs  Lab 11/23/17 0350 11/24/17 0448 11/26/17 0520  WBC 12.5* 12.4* 12.2*  HGB 13.1 13.5 13.5  HCT 41.3 42.3 42.3  MCV 91.0 89.8 91.2  PLT 344 310 664   Basic Metabolic Panel: Recent Labs  Lab 11/21/17 1752  11/23/17 0350 11/24/17 0448 11/25/17 0733 11/26/17 0520 11/27/17 0220 11/27/17 0756  NA  --    < > 142 140 138 138 136 138  K  --    < > 4.0 4.0 3.9 4.6 4.8 4.7  CL  --    < > 108 103 101 102 100* 101  CO2  --    < > 22 23 24 23 26 29   GLUCOSE  --    < > 207* 225* 290* 317* 338* 350*  BUN  --    < > 20 25* 25* 24* 19 18  CREATININE  --    < > 0.84 1.00 0.87 0.91 0.80 0.79  CALCIUM  --    < > 9.7 9.4 9.3 9.5 9.8 9.9  MG 1.9  --  1.4* 1.6* 1.7 1.6*  --  1.9  PHOS 2.4*  --   --   --   --   --   --    --    < > = values in this interval not displayed.   GFR: Estimated Creatinine Clearance: 118.6 mL/min (by C-G formula based on SCr of 0.79 mg/dL). Liver Function Tests: No results for input(s): AST, ALT, ALKPHOS, BILITOT, PROT, ALBUMIN in the last 168 hours. No results for input(s): LIPASE, AMYLASE in the last 168 hours. No results for input(s): AMMONIA in the last 168 hours. Coagulation Profile: No results for input(s): INR, PROTIME in the last 168 hours. Cardiac Enzymes: Recent Labs  Lab 11/25/17 1220  TROPONINI <0.03   BNP (last 3 results) No results for input(s): PROBNP in the last 8760 hours. HbA1C: No results for input(s): HGBA1C in the last 72 hours. CBG: Recent Labs  Lab 11/27/17 0854 11/27/17 1152 11/27/17 1649 11/27/17 2111 11/28/17 0854  GLUCAP 362* 333* 246* 283* 306*   Lipid Profile: No results for input(s): CHOL, HDL, LDLCALC, TRIG, CHOLHDL, LDLDIRECT in the last 72 hours. Thyroid Function Tests: No results for input(s): TSH, T4TOTAL, FREET4, T3FREE, THYROIDAB in the last 72 hours. Anemia Panel:  No results for input(s): VITAMINB12, FOLATE, FERRITIN, TIBC, IRON, RETICCTPCT in the last 72 hours. Urine analysis:    Component Value Date/Time   COLORURINE RED (A) 10/31/2016 2130   APPEARANCEUR CLOUDY (A) 10/31/2016 2130   LABSPEC 1.027 10/31/2016 2130   PHURINE 6.0 10/31/2016 2130   GLUCOSEU >=500 (A) 10/31/2016 2130   HGBUR LARGE (A) 10/31/2016 2130   BILIRUBINUR NEGATIVE 10/31/2016 2130   KETONESUR NEGATIVE 10/31/2016 2130   PROTEINUR 100 (A) 10/31/2016 2130   UROBILINOGEN 0.2 04/09/2014 1157   NITRITE NEGATIVE 10/31/2016 2130   LEUKOCYTESUR NEGATIVE 10/31/2016 2130   Sepsis Labs: @LABRCNTIP (procalcitonin:4,lacticidven:4)  ) Recent Results (from the past 240 hour(s))  MRSA PCR Screening     Status: None   Collection Time: 11/20/17  5:13 AM  Result Value Ref Range Status   MRSA by PCR NEGATIVE NEGATIVE Final    Comment:        The GeneXpert  MRSA Assay (FDA approved for NASAL specimens only), is one component of a comprehensive MRSA colonization surveillance program. It is not intended to diagnose MRSA infection nor to guide or monitor treatment for MRSA infections. Performed at [facility], [place] [address] [place], [place] [address]       Radiology Studies: No results found.   Scheduled Meds: . [MAR Hold] buPROPion  100 mg Oral BID  . [MAR Hold] cholestyramine  4 g Oral QODAY  . diphenhydrAMINE      . [MAR Hold] enoxaparin (LOVENOX) injection  60 mg Subcutaneous Q24H  . [MAR Hold] gabapentin  300 mg Oral BID  . [MAR Hold] insulin aspart  0-20 Units Subcutaneous TID WC  . [MAR Hold] insulin aspart  0-5 Units Subcutaneous QHS  . [MAR Hold] insulin aspart  8 Units Subcutaneous TID WC  . [MAR Hold] insulin glargine  38 Units Subcutaneous QHS  . [MAR Hold] ipratropium-albuterol  3 mL Nebulization TID  . [MAR Hold] levothyroxine  75 mcg Oral QAC breakfast  . [MAR Hold] lidocaine  4 mL Intradermal Once  . [MAR Hold] methylPREDNISolone  20 mg Oral Q breakfast   Followed by  . [MAR Hold] methylPREDNISolone  16 mg Oral Q breakfast   Followed by  . [MAR Hold] methylPREDNISolone  12 mg Oral Q breakfast   Followed by  . [MAR Hold] methylPREDNISolone  8 mg Oral Q breakfast   Followed by  . [MAR Hold] methylPREDNISolone  4 mg Oral Q breakfast  . methylPREDNISolone sodium succinate      . [MAR Hold] pantoprazole  40 mg Oral BID  . [MAR Hold] protein supplement shake  11 oz Oral BID BM  . [MAR Hold] Racepinephrine HCl  0.5 mL Nebulization Once   Continuous Infusions: . sodium chloride 0  (11/22/17 1500)     LOS: 8 days   Time Spent in minutes   45 minutes  [place] Current D.O. on 11/28/2017 at 9:54 AM  Between 7am to 7pm - Pager - [phone]  After 7pm go to [email or web address] - password [email or web address]  And look for the night coverage person covering for me after hours  [facility] Office   [phone]

## 2017-11-28 NOTE — Op Note (Signed)
11/28/2017 10:33 AM   PATIENT:  [name], 42 y.o. female  PRE-OPERATIVE DIAGNOSIS: Airway obstruction  POST-OPERATIVE DIAGNOSIS: Airway obstruction  PROCEDURE:  Procedure(s): TRACHEOSTOMY, Direct laryngoscopy  SURGEON:  Surgeon(s): Beckie Salts, MD  ASSISTANTS: none   ANESTHESIA:   general  EBL: Minimal   DRAINS: none   LOCAL MEDICATIONS USED:  NONE  COUNTS CORRECT:  YES  PROCEDURE DETAILS: Patient was taken to the operating room and placed on the operating table in the supine position.  Following induction of general endotracheal anesthesia, the neck was prepped and draped in a standard fashion. A vertical incision was created just above the sternal notch using electrocautery. The midline fascia was divided.  There is extensive fatty tissue that had to be dissected in order to expose the thyroid isthmus.  The isthmus of the thyroid was reflected superiorly and the upper trachea was exposed. A vertical tracheotomy was created including the second and third tracheal rings.  2-0 nylon stay sutures were placed one on either side of the tracheotomy and secured to the cervical skin with benzoin and Steri-Strips.  The orotracheal tube was removed. The # 7 adjustable length extra long tracheostomy tube was placed without difficulty and the cuff was inflated. The shield was secured to the neck using a Velcro straps and nylon suture.  Direct laryngoscopy was then performed using a Jako laryngoscope.  The cords appeared clear.  The arytenoids were very swollen.  I cannot assess mobility due to the anesthesia.  The entire subglottis was circumferentially swollen with necrotic debris and fibrinous exudate.  The scope was removed.  The patient was then transferred to the intensive care unit in critical condition.  PLAN OF CARE: Transfer to ICU  PATIENT DISPOSITION:  ICU - hemodynamically stable.

## 2017-11-28 NOTE — Progress Notes (Signed)
PT Cancellation Note  Patient Details Name: [name] MRN: [id number] DOB: 1976/02/07   Cancelled Treatment:    Reason Eval/Treat Not Completed: Medical issues which prohibited therapy patient having multiple episodes of respiratory distress, received trach in OR this morning and was transferred to the ICU in critical condition following surgery per MD notes. Plan to hold PT for now, will return for skilled treatments when patient is medically stable.    Deniece Ree PT, DPT, CBIS  Supplemental Physical Therapist [facility]   Pager [phone]

## 2017-11-28 NOTE — Progress Notes (Signed)
Patient ID: [name], female   DOB: December 18, 1975, 42 y.o.   MRN: [id number]  Reevaluated on rounds this morning.  She continues to struggle.  She is sitting up in the bed with tachypnea and significant stridor.  She is a follow.  I have reviewed the notes from my partners.  I think the best course of action would be to bring her to the operating room urgently and perform tracheostomy and then direct laryngoscopy to further evaluate the laryngeal pathology.  Given her morbid obesity, history of obstructive sleep apnea, and poor overall health, I think tracheostomy would be the safest route.  She is agreeable.  We will schedule soon as possible.

## 2017-11-28 NOTE — Anesthesia Procedure Notes (Signed)
Procedure Name: Intubation Date/Time: 11/28/2017 9:28 AM Performed by: Julieta Bellini, CRNA Pre-anesthesia Checklist: Patient identified, Emergency Drugs available, Suction available and Patient being monitored Patient Re-evaluated:Patient Re-evaluated prior to induction Oxygen Delivery Method: Circle system utilized Preoxygenation: Pre-oxygenation with 100% oxygen Induction Type: IV induction and Rapid sequence Laryngoscope Size: Glidescope and 3 Grade View: Grade I Tube type: Oral Tube size: 7.0 mm Number of attempts: 1 Airway Equipment and Method: Stylet Placement Confirmation: ETT inserted through vocal cords under direct vision,  positive ETCO2 and breath sounds checked- equal and bilateral Secured at: 23 cm Tube secured with: Tape Dental Injury: Teeth and Oropharynx as per pre-operative assessment  Difficulty Due To: Difficulty was anticipated

## 2017-11-28 NOTE — Anesthesia Postprocedure Evaluation (Signed)
Anesthesia Post Note  Patient: Natanya L Mau  Procedure(s) Performed: TRACHEOSTOMY (N/A Neck)     Patient location during evaluation: SICU Anesthesia Type: General Level of consciousness: awake and patient cooperative Pain management: pain level controlled Vital Signs Assessment: post-procedure vital signs reviewed and stable Respiratory status: patient connected to tracheostomy mask oxygen Cardiovascular status: stable Postop Assessment: no apparent nausea or vomiting Anesthetic complications: no    Last Vitals:  Vitals Value Taken Time  BP    Temp    Pulse 94 11/28/2017  5:21 PM  Resp 8 11/28/2017  5:21 PM  SpO2 93 % 11/28/2017  5:21 PM  Vitals shown include unvalidated device data.  Last Pain:  Vitals:   11/28/17 1500  TempSrc: Oral  PainSc:                  Aero Drummonds

## 2017-11-29 ENCOUNTER — Encounter (HOSPITAL_COMMUNITY): Payer: Self-pay | Admitting: Otolaryngology

## 2017-11-29 ENCOUNTER — Inpatient Hospital Stay (HOSPITAL_COMMUNITY): Payer: Medicaid Other

## 2017-11-29 DIAGNOSIS — Z79899 Other long term (current) drug therapy: Secondary | ICD-10-CM

## 2017-11-29 DIAGNOSIS — J9690 Respiratory failure, unspecified, unspecified whether with hypoxia or hypercapnia: Secondary | ICD-10-CM

## 2017-11-29 LAB — BASIC METABOLIC PANEL
ANION GAP: 12 (ref 5–15)
BUN: 22 mg/dL — ABNORMAL HIGH (ref 6–20)
CALCIUM: 9.4 mg/dL (ref 8.9–10.3)
CO2: 29 mmol/L (ref 22–32)
CREATININE: 0.83 mg/dL (ref 0.44–1.00)
Chloride: 97 mmol/L — ABNORMAL LOW (ref 101–111)
GFR calc non Af Amer: >60 mL/min (ref >60)
Glucose, Bld: 266 mg/dL — ABNORMAL HIGH (ref 65–99)
Potassium: 4.4 mmol/L (ref 3.5–5.1)
SODIUM: 138 mmol/L (ref 135–145)

## 2017-11-29 LAB — CBC
HEMATOCRIT: 39.1 % (ref 36.0–46.0)
Hemoglobin: 12.7 g/dL (ref 12.0–15.0)
MCH: 29.3 pg (ref 26.0–34.0)
MCHC: 32.5 g/dL (ref 30.0–36.0)
MCV: 90.1 fL (ref 78.0–100.0)
Platelets: 460 10*3/uL — ABNORMAL HIGH (ref 150–400)
RBC: 4.34 MIL/uL (ref 3.87–5.11)
RDW: 12.8 % (ref 11.5–15.5)
WBC: 17.3 10*3/uL — AB (ref 4.0–10.5)

## 2017-11-29 LAB — MAGNESIUM: Magnesium: 1.8 mg/dL (ref 1.7–2.4)

## 2017-11-29 LAB — GLUCOSE, CAPILLARY
GLUCOSE-CAPILLARY: 249 mg/dL — AB (ref 65–99)
Glucose-Capillary: 255 mg/dL — ABNORMAL HIGH (ref 65–99)
Glucose-Capillary: 256 mg/dL — ABNORMAL HIGH (ref 65–99)
Glucose-Capillary: 238 mg/dL — ABNORMAL HIGH (ref 65–99)

## 2017-11-29 LAB — PHOSPHORUS: PHOSPHORUS: 3.5 mg/dL (ref 2.5–4.6)

## 2017-11-29 MED ORDER — LEVOTHYROXINE SODIUM 100 MCG IV SOLR
37.5000 ug | Freq: Every day | INTRAVENOUS | Status: DC
  Administered 2017-11-29 – 2017-12-06 (×8): 37.5 ug via INTRAVENOUS
  Filled 2017-11-29 (×8): qty 5

## 2017-11-29 MED ORDER — PANTOPRAZOLE SODIUM 40 MG IV SOLR
40.0000 mg | Freq: Two times a day (BID) | INTRAVENOUS | Status: DC
  Administered 2017-11-29 – 2017-12-08 (×19): 40 mg via INTRAVENOUS
  Filled 2017-11-29 (×19): qty 40

## 2017-11-29 MED ORDER — ORAL CARE MOUTH RINSE
15.0000 mL | Freq: Two times a day (BID) | OROMUCOSAL | Status: DC
  Administered 2017-11-30 – 2018-01-06 (×40): 15 mL via OROMUCOSAL

## 2017-11-29 MED ORDER — CHLORHEXIDINE GLUCONATE 0.12 % MT SOLN
15.0000 mL | Freq: Two times a day (BID) | OROMUCOSAL | Status: DC
  Administered 2017-11-30 – 2018-01-07 (×66): 15 mL via OROMUCOSAL
  Filled 2017-11-29 (×65): qty 15

## 2017-11-29 MED ORDER — METHYLPREDNISOLONE SODIUM SUCC 40 MG IJ SOLR
40.0000 mg | Freq: Every day | INTRAMUSCULAR | Status: DC
  Administered 2017-11-29 – 2017-12-03 (×5): 40 mg via INTRAVENOUS
  Filled 2017-11-29 (×5): qty 1

## 2017-11-29 NOTE — Progress Notes (Signed)
Patient and patient's husband do not want a man named Riley Kill visiting at any time. Oncoming RN will be notified. Lazette Estala, Rande Brunt, RN

## 2017-11-29 NOTE — Consult Note (Signed)
[facility] Psych Consult Progress Note  11/29/2017 2:02 PM [name]  MRN:  [id number] Subjective:   [name] was last seen on 3/18. She was admitted following a suicide attempt with Baclofen, Trazodone and Tramadol. She was recommended for inpatient psychiatric hospitalization. Trazodone 50 mg qhs PRN, Wellbutrin 100 mg BID and Gabapentin 300 mg BID were restarted on 3/18. Patient received a tracheostomy yesterday due to worsening respiratory distress/stridor due to posterior supraglottic swelling. She has also been placed on BiPAP. Per nursing note today, she wrote on a note to her mother that stated, "Gwenlyn Found me now."  [name] had a difficult time speaking today and was only able to say 1-2 words at a time. She denies current SI. She also denies HI or AVH. She requests to be able to sleep because she just received pain medication. She denies other physical complaints at this time.   Principal Problem: Severe episode of recurrent major depressive disorder, without psychotic features ([place]) Diagnosis:   Patient Active Problem List   Diagnosis Date Noted  . Acute respiratory failure with hypoxemia ([place]) [J96.01]   . SOB (shortness of breath) [R06.02]   . 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 Hoarse voice quality [R49.0]   . Hypomagnesemia [E83.42]   . Shortness of breath [R06.02]   . Severe episode of recurrent major depressive disorder, without psychotic features ([place]) [F33.2]   . Intentional overdose of drug in tablet form ([place]) [T50.902A] 11/20/2017  . Intentional drug overdose ([place]) [T50.902A]   . Seizure ([place]) [R56.9]   . Acute respiratory failure with hypercapnia ([place]) [J96.02]   . Status epilepticus ([place]) [G40.901]   . 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 Abdominal pain, epigastric [R10.13]   . LUQ abdominal pain [R10.12]   . Nausea and vomiting [R11.2]   . Gastroparesis [K31.84]   . Hypertriglyceridemia [E78.1] 07/22/2015  .  Diabetes mellitus due to underlying condition, uncontrolled, with diabetic neuropathy, with long-term current use of insulin (HCC) [E08.40, E08.65, Z79.4]   . Vitamin D deficiency [E55.9] 07/19/2015  . 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 Osteoporosis [M81.0] 07/19/2015  . 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 Fracture, tibial plateau [S82.143A] 07/16/2015  . Uncontrolled diabetes mellitus with diabetic neuropathy, with long-term current use of insulin ([place]) [E11.40, Z79.4, E11.65] 07/16/2015  . Leukocytosis [D72.829] 07/16/2015  . Essential hypertension [I10] 07/16/2015  . Morbid obesity ([place]) [E66.01] 04/20/2014  . Hyperlipidemia [E78.5] 07/31/2012  . Cushing disease ([place]) [E24.0] 07/31/2012  . Hypothyroid [E03.9] 07/31/2012  . Major depressive disorder, recurrent episode, mild ([place]) [F33.0] 04/08/2012   Total Time spent with patient: 15 minutes  Past Psychiatric History: MDD  Past Medical History:  Past Medical History:  Diagnosis Date  . Anxiety   . Complication of anesthesia    Pt. states takes long time to wake up from it.   . Cushing's disease ([place])   . Depression   . Diabetes ([place])   . Hyperlipidemia   . Hyperlipidemia   . Hypertension   . Morbid obesity ([place])   . Osteoporosis 07/19/2015  . Periprosthetic fracture 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), R tibial plateau  07/18/2015  . Sleep apnea   . Uncontrolled diabetes mellitus with diabetic neuropathy, with long-term current use of insulin ([place]) 07/16/2015  . Vitamin D deficiency 07/19/2015    Past Surgical History:  Procedure Laterality Date  . ANTERIOR  TALOFIBULAR LIGAMENT REPAIR Left 11/15/2014   Procedure: ANTERIOR TALOFIBULAR LIGAMENT REPAIR;  Surgeon: Jana Half, DPM;  Location: [facility];  Service: Podiatry;  Laterality: Left;  . CESAREAN SECTION  dec 1997/   06-03-2001/   01-01-2005   BILATERAL TUBAL LIGATION WITH LAST ONE  . DILATION AND CURETTAGE OF UTERUS  1995   WITH SUCTION  . ESOPHAGOGASTRODUODENOSCOPY (EGD) WITH PROPOFOL N/A 10/04/2016   Procedure: ESOPHAGOGASTRODUODENOSCOPY (EGD) WITH PROPOFOL;  Surgeon: [name], MD;  Location: [facility];  Service: Endoscopy;  Laterality: N/A;  . LAPAROSCOPIC CHOLECYSTECTOMY  09-25-2005  . ORIF TIBIA PLATEAU Right 07/19/2015   Procedure: OPEN REDUCTION INTERNAL FIXATION (ORIF) RIGHT TIBIAL PLATEAU;  Surgeon: Altamese Suquamish, MD;  Location: [facility];  Service: Orthopedics;  Laterality: Right;  . PARTIAL KNEE ARTHROPLASTY Right 04/19/2014   Procedure: RIGHT UNI KNEE ARTHROPLASTY MEDIALLY ;  Surgeon: Mauri Pole, MD;  Location: [facility];  Service: Orthopedics;  Laterality: Right;  . TUBAL LIGATION     Family History:  Family History  Adopted: Yes  Problem Relation Age of Onset  . Autism Son   . ADD / ADHD Son   . Apraxia Son    Family Psychiatric  History: Son-autism and ADHD.  Social History:  Social History   Substance and Sexual Activity  Alcohol Use Yes   Comment: RARE     Social History   Substance and Sexual Activity  Drug Use No  . Types: Marijuana    Social History   Socioeconomic History  . Marital status: Married    Spouse name: [name]  . Number of children: 2  . Years of education: Not on file  . Highest education level: Not on file  Occupational History  . Not on file  Social Needs  . Financial resource strain: Not on file  . Food insecurity:    Worry: Not on file    Inability: Not on file  . Transportation needs:    Medical: Not on file    Non-medical: Not on file  Tobacco Use  . Smoking status: Current Every Day Smoker    Years: 4.00    Types: Cigarettes  . Smokeless tobacco: Never Used  Substance and Sexual Activity  . Alcohol use: Yes    Comment: RARE  . Drug use: No    Types: Marijuana  . Sexual activity: Yes    Partners: Male    Birth  control/protection: Pill  Lifestyle  . Physical activity:    Days per week: Patient refused    Minutes per session: Patient refused  . Stress: Not on file  Relationships  . Social connections:    Talks on phone: Not on file    Gets together: Not on file    Attends religious service: Not on file    Active member of club or organization: Not on file    Attends meetings of clubs or organizations: Not on file    Relationship status: Not on file  Other Topics Concern  . Not on file  Social History Narrative  . Not on file    Sleep: Fair  Appetite:  Fair  Current Medications: Current Facility-Administered Medications  Medication Dose Route Frequency Provider Last Rate Last Dose  . 0.9 %  sodium chloride infusion   Intravenous Continuous Collene Gobble, MD 10 mL/hr at 11/29/17 1200    . acetaminophen (TYLENOL) tablet 650 mg  650 mg Oral Q6H PRN Bodenheimer, Charles A, NP   650 mg at  11/27/17 2056  . buPROPion [facility]) tablet 100 mg  100 mg Oral BID Rosita Fire, MD   100 mg at 11/27/17 2131  . cholestyramine (QUESTRAN) packet 4 g  4 g Oral Georgeanna Lea, MD   4 g at 11/27/17 1216  . enoxaparin (LOVENOX) injection 60 mg  60 mg Subcutaneous Q24H Rosita Fire, MD   60 mg at 11/28/17 1252  . fentaNYL (SUBLIMAZE) injection 50 mcg  50 mcg Intravenous Q2H PRN Sampson Goon, MD   50 mcg at 11/29/17 1224  . gabapentin (NEURONTIN) capsule 300 mg  300 mg Oral BID Rosita Fire, MD   300 mg at 11/27/17 2131  . hydrALAZINE (APRESOLINE) injection 10 mg  10 mg Intravenous Q4H PRN Darlina Sicilian A, NP   10 mg at 11/22/17 1358  . hydrOXYzine (ATARAX/VISTARIL) tablet 25 mg  25 mg Oral TID PRN Faythe Dingwall, DO   25 mg at 11/25/17 1738  . insulin aspart (novoLOG) injection 0-20 Units  0-20 Units Subcutaneous TID WC Collene Gobble, MD   11 Units at 11/29/17 1154  . insulin aspart (novoLOG) injection 0-5 Units  0-5 Units Subcutaneous QHS Collene Gobble,  MD   3 Units at 11/28/17 2347  . insulin aspart (novoLOG) injection 8 Units  8 Units Subcutaneous TID WC Mikhail, Velta Addison, DO   8 Units at 11/28/17 1249  . insulin glargine (LANTUS) injection 38 Units  38 Units Subcutaneous QHS Cristal Ford, DO   38 Units at 11/28/17 2159  . ipratropium-albuterol (DUONEB) 0.5-2.5 (3) MG/3ML nebulizer solution 3 mL  3 mL Nebulization Q6H PRN Mikhail, Velta Addison, DO      . ipratropium-albuterol (DUONEB) 0.5-2.5 (3) MG/3ML nebulizer solution 3 mL  3 mL Nebulization TID Cristal Ford, DO   3 mL at 11/29/17 0756  . levothyroxine (SYNTHROID, LEVOTHROID) tablet 75 mcg  75 mcg Oral QAC breakfast Darlina Sicilian A, NP   75 mcg at 11/27/17 1610  . lidocaine (XYLOCAINE) 2 % jelly 1 application  1 application Topical Once PRN [name], MD      . lidocaine (XYLOCAINE) 4 % (PF) injection 4 mL  4 mL Intradermal Once Schorr, Rhetta Mura, NP      . lidocaine (XYLOCAINE) 4 % external solution 0-50 mL  0-50 mL Topical Once PRN [name], MD      . lidocaine-EPINEPHrine (XYLOCAINE-EPINEPHrine) 1 %-1:200000 (PF) injection 0-30 mL  0-30 mL Intradermal Once PRN [name], MD      . menthol-cetylpyridinium (CEPACOL) lozenge 3 mg  1 lozenge Oral PRN Deterding, Guadelupe Sabin, MD   3 mg at 11/27/17 2336  . methylPREDNISolone (MEDROL) tablet 16 mg  16 mg Oral Q breakfast Cristal Ford, DO       Followed by  . [START ON 11/30/2017] methylPREDNISolone (MEDROL) tablet 12 mg  12 mg Oral Q breakfast Cristal Ford, DO       Followed by  . [START ON 12/01/2017] methylPREDNISolone (MEDROL) tablet 8 mg  8 mg Oral Q breakfast Cristal Ford, DO       Followed by  . [START ON 12/02/2017] methylPREDNISolone (MEDROL) tablet 4 mg  4 mg Oral Q breakfast Mikhail, Velta Addison, DO      . oxymetazoline (AFRIN) 0.05 % nasal spray 1 spray  1 spray Each Nare Once PRN [name], MD      . pantoprazole (PROTONIX) injection 40 mg  40 mg Intravenous BID Cristal Ford, DO   40 mg at  11/29/17 9604  .  phenol (CHLORASEPTIC) mouth spray 1 spray  1 spray Mouth/Throat PRN Cristal Ford, DO   1 spray at 11/27/17 2145  . protein supplement (PREMIER PROTEIN) liquid  11 oz Oral BID BM Rosita Fire, MD   11 oz at 11/27/17 1443  . Racepinephrine HCl 2.25 % nebulizer solution 0.5 mL  0.5 mL Nebulization Once Cristal Ford, DO      . silver nitrate applicators applicator 1 Stick  1 Stick Topical Once PRN [name], MD      . traZODone (DESYREL) tablet 50 mg  50 mg Oral QHS PRN Faythe Dingwall, DO   50 mg at 11/26/17 2226  . TRIPLE ANTIBIOTIC [phone] OINT 1 application  1 application Apply externally Once PRN [name], MD        Lab Results:  Results for orders placed or performed during the hospital encounter of 11/20/17 (from the past 48 hour(s))  Glucose, capillary     Status: Abnormal   Collection Time: 11/27/17  4:49 PM  Result Value Ref Range   Glucose-Capillary 246 (H) 65 - 99 mg/dL  Glucose, capillary     Status: Abnormal   Collection Time: 11/27/17  9:11 PM  Result Value Ref Range   Glucose-Capillary 283 (H) 65 - 99 mg/dL  Glucose, capillary     Status: Abnormal   Collection Time: 11/28/17  8:54 AM  Result Value Ref Range   Glucose-Capillary 306 (H) 65 - 99 mg/dL  MRSA PCR Screening     Status: None   Collection Time: 11/28/17 11:17 AM  Result Value Ref Range   MRSA by PCR NEGATIVE NEGATIVE    Comment:        The GeneXpert MRSA Assay (FDA approved for NASAL specimens only), is one component of a comprehensive MRSA colonization surveillance program. It is not intended to diagnose MRSA infection nor to guide or monitor treatment for MRSA infections. Performed at [facility], [place] [address] [place], [place] [address]   Glucose, capillary     Status: Abnormal   Collection Time: 11/28/17 12:33 PM  Result Value Ref Range   Glucose-Capillary 360 (H) 65 - 99 mg/dL  Glucose, capillary     Status: Abnormal   Collection  Time: 11/28/17  4:09 PM  Result Value Ref Range   Glucose-Capillary 367 (H) 65 - 99 mg/dL  Glucose, capillary     Status: Abnormal   Collection Time: 11/28/17 10:47 PM  Result Value Ref Range   Glucose-Capillary 255 (H) 65 - 99 mg/dL  CBC     Status: Abnormal   Collection Time: 11/29/17  4:09 AM  Result Value Ref Range   WBC 17.3 (H) 4.0 - 10.5 K/uL   RBC 4.34 3.87 - 5.11 MIL/uL   Hemoglobin 12.7 12.0 - 15.0 g/dL   HCT 39.1 36.0 - 46.0 %   MCV 90.1 78.0 - 100.0 fL   MCH 29.3 26.0 - 34.0 pg   MCHC 32.5 30.0 - 36.0 g/dL   RDW 12.8 11.5 - 15.5 %   Platelets 460 (H) 150 - 400 K/uL    Comment: Performed at [facility], [place] [address] [place], [place] [address]  Basic metabolic panel     Status: Abnormal   Collection Time: 11/29/17  4:09 AM  Result Value Ref Range   Sodium 138 135 - 145 mmol/L   Potassium 4.4 3.5 - 5.1 mmol/L   Chloride 97 (L) 101 - 111 mmol/L   CO2 29 22 - 32  mmol/L   Glucose, Bld 266 (H) 65 - 99 mg/dL   BUN 22 (H) 6 - 20 mg/dL   Creatinine, Ser 0.83 0.44 - 1.00 mg/dL   Calcium 9.4 8.9 - 10.3 mg/dL   GFR calc non Af Amer >60 >60 mL/min   GFR calc Af Amer >60 >60 mL/min    Comment: (NOTE) The eGFR has been calculated using the CKD EPI equation. This calculation has not been validated in all clinical situations. eGFR's persistently <60 mL/min signify possible Chronic Kidney Disease.    Anion gap 12 5 - 15    Comment: Performed at [facility] [address], [place], [place] [address]  Magnesium     Status: None   Collection Time: 11/29/17  4:09 AM  Result Value Ref Range   Magnesium 1.8 1.7 - 2.4 mg/dL    Comment: Performed at [facility] [address], [place], [place] [address]  Phosphorus     Status: None   Collection Time: 11/29/17  4:09 AM  Result Value Ref Range   Phosphorus 3.5 2.5 - 4.6 mg/dL    Comment: Performed at [facility] [address], [place], [place] [address]  Glucose, capillary     Status: Abnormal    Collection Time: 11/29/17  8:19 AM  Result Value Ref Range   Glucose-Capillary 255 (H) 65 - 99 mg/dL  Glucose, capillary     Status: Abnormal   Collection Time: 11/29/17 11:56 AM  Result Value Ref Range   Glucose-Capillary 256 (H) 65 - 99 mg/dL    Blood Alcohol level:  Lab Results  Component Value Date   ETH <10 11/20/2017    Musculoskeletal: Strength & Muscle Tone: UTA since patient minimally participated in interview secondary to drowsiness. Gait & Station: UTA since patient is lying in bed. Patient leans: N/A  Psychiatric Specialty Exam: Physical Exam  Nursing note and vitals reviewed. Constitutional: She is oriented to person, place, and time. She appears well-developed and well-nourished.  HENT:  Head: Normocephalic.  Tracheostomy in place.   Neck: Normal range of motion.  Respiratory: Effort normal.  Musculoskeletal: Normal range of motion.  Neurological: She is alert and oriented to person, place, and time.  Skin: No rash noted.  Psychiatric: Her speech is normal and behavior is normal. Thought content normal. Cognition and memory are normal. She expresses impulsivity. She exhibits a depressed mood.    Review of Systems  Musculoskeletal: Positive for neck pain (Tracheostomy in place.).  Psychiatric/Behavioral: Positive for depression and suicidal ideas. Negative for hallucinations and substance abuse.  All other systems reviewed and are negative.   Blood pressure 136/85, pulse (!) 155, temperature 98.9 F (37.2 C), temperature source Oral, resp. rate (!) 25, height 5' 2"  (1.575 m), weight (!) 136.5 kg (300 lb 14.9 oz), SpO2 97 %.Body mass index is 55.04 kg/m.  General Appearance: Fairly Groomed  Eye Contact:  Fair  Speech:  Hoarse and almost whisper due to tracheostomy.  Volume:  Decreased  Mood:  Did not state  Affect:  Depressed  Thought Process:  Goal Directed, Linear and Descriptions of Associations: Intact  Orientation:  Full (Time, Place, and Person)   Thought Content:  Logical  Suicidal Thoughts:  No  Homicidal Thoughts:  No  Memory:  Immediate;   Good Recent;   Good Remote;   Good  Judgement:  Fair  Insight:  Fair  Psychomotor Activity:  Decreased  Concentration:  Concentration: Good and Attention Span: Good  Recall:  Good  Fund of Knowledge:  Good  Language:  Good  Akathisia:  No  Handed:  Right  AIMS (if indicated):   N/A  Assets:  Financial Resources/Insurance Housing Social Support  ADL's:  Impaired  Cognition:  WNL  Sleep:   N/A   Assessment:  [name] is a 42 y.o. female who was admitted for suicide attempt by drug overdose in the setting of depression and family stressors. Patient requested for her mother to end her life today in the setting of ongoing medical problems and recent tracheostomy. She continues to warrant inpatient psychiatric hospitalization due to high risk of harm to self in the setting of multiple stressors.   Treatment Plan Summary: -Continue Wellbutrin 100 mg BID for depression, Trazodone 50 mg qhs PRN for insmonia and Gabapentin 200 mg BID for anxiety. -Patient warrants inpatient psychiatric hospitalization given high risk of harm to self. -Continue bedside sitter.  -Please pursue involuntary commitment if patient refuses voluntary psychiatric hospitalization or attempts to leave the hospital.  -Will sign off on patient at this time. Please consult psychiatry again as needed.     Faythe Dingwall, DO 11/29/2017, 2:02 PM

## 2017-11-29 NOTE — Progress Notes (Signed)
As patient and pt's mother were communicating about pt's health status she wrote on paper to her mother, "kill me now". Pt's mother pulled me aside in the hallway to relay this message and ask about pt's disposition after her inpatient stay here. Pt has SI sitter at the bedside and all safety measures in place. Will monitor closely. Griffin Gerrard, Rande Brunt, RN

## 2017-11-29 NOTE — Progress Notes (Signed)
Modified Barium Swallow Progress Note  Imaging recorded at 15 frames per second, which degrades video in playback mode.   Patient Details  Name: REYNE FALCONI MRN: [id number] Date of Birth: 10-09-1975  Today's Date: 11/29/2017  Modified Barium Swallow completed.  Full report located under Chart Review in the Imaging Section.  Brief recommendations include the following:  Clinical Impression:   Pt presents with pharyngeal dysphagia secondary to significant edema at level of larynx, preventing airway protection and allowing penetration of thin liquids and purees into the larynx to the level of the vocal cords.  Pt unable to use PMV given poor upper airway access; cued cough is not effective given absence of subglottic pressure.  Recommend continued NPO for now; pt will likely need NG for nutrition until edema subsides.  Recommend placing it under fluoroscopy due to swelling at level of UES.  D/W RN, pt and her husband.  SLP will follow for improvements/readiness.  After oral care, pt may have limited ice chips for pleasure.   Swallow Evaluation Recommendations       SLP Diet Recommendations: NPO;Ice chips PRN after oral care       Medication Administration: Via alternative means               Oral Care Recommendations: Oral care QID        [name] 11/29/2017,5:05 PM

## 2017-11-29 NOTE — Progress Notes (Addendum)
Physical Therapy Treatment Patient Details Name: [name] MRN: [id number] DOB: June 07, 1976 Today's Date: 11/29/2017    History of Present Illness Patient is a 42 y/o female who presents with drug OD and Sz. Intubated 3/13-3/15. resp failure with emergent trach 3/21 due to vocal cord dysfunction. PMH includes ORIF RLE, depression, anxiety, morbid obesity, DM, Cushing's, Rt uni knee replacement, HTN, left ankle surgery, dyslipidemia, hypothyroidism.     PT Comments    Pt able to tolerate mobility OOB and progressing to standing and limited gait. Pt reporting some anxiety over respiratory status with mobility and encouraged to mobilize. Pt needing increased assist for bed mobility and gait but anticipate she will return to independent level once her anxiety decreases about new trach. Will continue to follow, encouraged OOB throughout the day with the assist of nursing.     Follow Up Recommendations  Other (comment);Supervision - Intermittent     Equipment Recommendations  Rolling walker with 5" wheels    Recommendations for Other Services       Precautions / Restrictions Precautions Precautions: Fall Precaution Comments: tachycardia; suicide precautions, trach    Mobility  Bed Mobility Overal bed mobility: Needs Assistance Bed Mobility: Supine to Sit     Supine to sit: HOB elevated;Min assist     General bed mobility comments: min assist to elevate trunk from surface with HHA, increased time and HOB 30degrees  Transfers Overall transfer level: Needs assistance   Transfers: Sit to/from Stand Sit to Stand: Min guard         General transfer comment: guarding for safety  Ambulation/Gait Ambulation/Gait assistance: Min guard Ambulation Distance (Feet): 40 Feet Assistive device: Rolling walker (2 wheeled) Gait Pattern/deviations: Step-through pattern;Decreased stride length;Wide base of support   Gait velocity interpretation: Below normal speed for  age/gender General Gait Details: SpO2 97-99% on 40% FiO2 with HR 120-155 with gait, pt with limited distance today due to fatigue and some anxiety with mobility. Pt required chair pulled to her due to fatigue   Stairs            Wheelchair Mobility    Modified Rankin (Stroke Patients Only)       Balance Overall balance assessment: Needs assistance   Sitting balance-Leahy Scale: Good       Standing balance-Leahy Scale: Poor Standing balance comment: pt requesting RW and UE support in standing today                            Cognition Arousal/Alertness: Awake/alert Behavior During Therapy: Flat affect Overall Cognitive Status: Difficult to assess                                 General Comments: pt responding to all questions, cues and commands appropriately      Exercises General Exercises - Lower Extremity Long Arc Quad: AROM;15 reps;Seated;Both Hip Flexion/Marching: AROM;10 reps;Seated;Both    General Comments        Pertinent Vitals/Pain Pain Assessment: 0-10 Pain Score: 8  Pain Location: head Pain Descriptors / Indicators: Aching Pain Intervention(s): Limited activity within patient's tolerance;Repositioned;Premedicated before session    Home Living                      Prior Function            PT Goals (current goals can now be found in the care  plan section) Progress towards PT goals: Progressing toward goals    Frequency           PT Plan Current plan remains appropriate    Co-evaluation              AM-PAC PT "6 Clicks" Daily Activity  Outcome Measure  Difficulty turning over in bed (including adjusting bedclothes, sheets and blankets)?: A Little Difficulty moving from lying on back to sitting on the side of the bed? : Unable Difficulty sitting down on and standing up from a chair with arms (e.g., wheelchair, bedside commode, etc,.)?: A Little Help needed moving to and from a bed to chair  (including a wheelchair)?: A Little Help needed walking in hospital room?: A Little Help needed climbing 3-5 steps with a railing? : A Little 6 Click Score: 16    End of Session Equipment Utilized During Treatment: Oxygen;Gait belt Activity Tolerance: Patient tolerated treatment well Patient left: with call bell/phone within [name];with nursing/sitter in room;in chair;with family/visitor present Nurse Communication: Mobility status PT Visit Diagnosis: Muscle weakness (generalized) (M62.81);Unsteadiness on feet (R26.81);Difficulty in walking, not elsewhere classified (R26.2)     Time: 6213-0865 PT Time Calculation (min) (ACUTE ONLY): 31 min  Charges:  $Gait Training: 8-22 mins $Therapeutic Activity: 8-22 mins                    G Codes:       [name], PT [phone]    [name] 11/29/2017, 2:01 PM

## 2017-11-29 NOTE — Progress Notes (Signed)
Inpatient Diabetes Program Recommendations  AACE/ADA: New Consensus Statement on Inpatient Glycemic Control (2015)  Target Ranges:  Prepandial:   less than 140 mg/dL      Peak postprandial:   less than 180 mg/dL (1-2 hours)      Critically ill patients:  140 - 180 mg/dL   Lab Results  Component Value Date   GLUCAP 255 (H) 11/29/2017   HGBA1C 7.2 (H) 11/20/2017    Review of Glycemic Control Results for Police, Neilah L (MRN [id number]) as of 11/29/2017 09:23  Ref. Range 11/28/2017 12:33 11/28/2017 16:09 11/28/2017 22:47 11/29/2017 08:19  Glucose-Capillary Latest Ref Range: 65 - 99 mg/dL 360 (H) 367 (H) 255 (H) 255 (H)   Current: Lantus 28 units QHS, Novolog 8 units TIDAC, Novolog 0-20 units TID, Novolog 0-5 units QHS    Inpatient Diabetes Program Recommendations:    Noted changes made on 3/21 to Lantus and meal coverage, as BS still in the 300's. Steroids beginning to taper. Patient received trach on 3/21, thus missing several doses of correction and AM meal coverage. When patient returned to unit, patient refused additional insulin. May have contributed to AM FSBS being elevated. No recs today, will watch.  Thanks, Bronson Curb, MSN, RNC-OB Diabetes Coordinator [phone] (8a-5p)

## 2017-11-29 NOTE — Progress Notes (Signed)
Pt oxygen saturation noted to be intermittently dropping to 86% on 40% FiO2 via trach collar while patient sleeping.  Oxygen increased to 60% FiO2 10L trach collar with oxygen saturation increasing to 97%.  No respiratory distress noted.  Respirations regular, even and unlabored.

## 2017-11-29 NOTE — Evaluation (Signed)
Passy-Muir Speaking Valve - Evaluation Patient Details  Name: FAVOR KREH MRN: [id number] Date of Birth: 12-26-1975  Today's Date: 11/29/2017 Time: 1400-1410 SLP Time Calculation (min) (ACUTE ONLY): 10 min  Past Medical History:  Past Medical History:  Diagnosis Date  . Anxiety   . Complication of anesthesia    Pt. states takes long time to wake up from it.   . Cushing's disease ([place])   . Depression   . Diabetes ([place])   . Hyperlipidemia   . Hyperlipidemia   . Hypertension   . Morbid obesity ([place])   . Osteoporosis 07/19/2015  . Periprosthetic fracture 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[place]), R tibial plateau  07/18/2015  . Sleep apnea   . Uncontrolled diabetes mellitus with diabetic neuropathy, with long-term current use of insulin ([place]) 07/16/2015  . Vitamin D deficiency 07/19/2015   Past Surgical History:  Past Surgical History:  Procedure Laterality Date  . ANTERIOR TALOFIBULAR LIGAMENT REPAIR Left 11/15/2014   Procedure: ANTERIOR TALOFIBULAR LIGAMENT REPAIR;  Surgeon: Jana Half, DPM;  Location: [facility];  Service: Podiatry;  Laterality: Left;  . CESAREAN SECTION  dec 1997/  06-03-2001/   01-01-2005   BILATERAL TUBAL LIGATION WITH LAST ONE  . DILATION AND CURETTAGE OF UTERUS  1995   WITH SUCTION  . ESOPHAGOGASTRODUODENOSCOPY (EGD) WITH PROPOFOL N/A 10/04/2016   Procedure: ESOPHAGOGASTRODUODENOSCOPY (EGD) WITH PROPOFOL;  Surgeon: [name], MD;  Location: [facility];  Service: Endoscopy;  Laterality: N/A;  . LAPAROSCOPIC CHOLECYSTECTOMY  09-25-2005  . ORIF TIBIA PLATEAU Right 07/19/2015   Procedure: OPEN REDUCTION INTERNAL FIXATION (ORIF) RIGHT TIBIAL PLATEAU;  Surgeon: Altamese [place], MD;  Location: [facility];  Service: Orthopedics;  Laterality: Right;  . PARTIAL KNEE ARTHROPLASTY Right 04/19/2014   Procedure: RIGHT UNI KNEE ARTHROPLASTY MEDIALLY ;  Surgeon: Mauri Pole, MD;  Location: [facility];  Service: Orthopedics;  Laterality: Right;  . TUBAL  LIGATION     HPI:  Patient is a 42 y/o female who presents with drug OD and Sz. Intubated 3/13-3/15. resp failure with emergent trach 3/21.  ENT flexible laryngoscopy revealed posterior supraglottic swelling; swollen vocal folds with no abduction; edematous arytenoids; entire subglottis circumferentially swollen with necrotic debris and fibrinous exudate.    Assessment / Plan / Recommendation Clinical Impression  Pt with difficulty tolerating PMV, likely due to edema within larynx.  Valve placed on trach hub; cuff deflated at baseline. Valve placed for intervals of 2-4 breath cycles, with mild air trapping noted upon removal.  Pt achieved low volume, hoarse voice that was intelligible; pt anxious and uncomfortable.  Sp02 remained between 93-95%.  Trials ceased.  Recommend PMV trials with SLP only - pt will have better success as edema subsides.  D/W Rn, pt and her husband.  SLP Visit Diagnosis: Aphonia (R49.1)    SLP Assessment  Patient needs continued Speech Lanaguage Pathology Services    Follow Up Recommendations       Frequency and Duration min 3x week  2 weeks    PMSV Trial PMSV was placed for: intervals of 2-4 breath cycles Able to redirect subglottic air through upper airway: (intermittently) Able to Attain Phonation: Yes Voice Quality: Hoarse;Low vocal intensity Able to Expectorate Secretions: No attempts Level of Secretion Expectoration with PMSV: Not observed Breath Support for Phonation: Inadequate Respirations During Trial: 18 SpO2 During Trial: 94 % Pulse During Trial: (90-125) Behavior: Anxious   Tracheostomy Tube    #7 [place] with aircuff   Vent Dependency  Vent Dependent:  No FiO2 (%): 35 %    Cuff Deflation Trial  GO Tolerated Cuff Deflation: (deflated at baseline)        [name] 11/29/2017, 4:52 PM

## 2017-11-29 NOTE — Progress Notes (Signed)
PROGRESS NOTE    [name] Patch  PPJ:[id number] DOB: March 05, 1976 DOA: 11/20/2017 PCP: Sandi Mariscal, MD   Brief Narrative:  42 year old morbidly obese female with history of Cushing's disease, diabetes on insulin, depression, hypertension, OSA who was admitted on 3/13 to ICU for intestinal drug overdose.  Patient took extra pills of baclofen, trazodone and tramadol.  Patient became encephalopathic and had generalized seizure in ER therefore patient was intubated and admitted by pulmonary critical care.  Patient was evaluated by psychiatrist who recommended inpatient psych on discharge.  Started on diuretics for pulmonary congestion.  Transferred to [facility] on 11/24/2017. Patient developed stridor and was seen by ENT, found to have irregular inflammatory vocal cord changes. ENT recommended medrol dose pack and PPI.  Overnight patient had increasing stridor. Rapid response was consulted, patient given racemic epi. 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 This morning, patient began to have more respiratory distress. Ordered BiPAP. PCCM and anesthesia at bedside for possible intubation. S/p tracheostomy.  Assessment & Plan   Acute respiratory failure secondary to stridor/Glottic obstruction  -ENT, Dr. Redmond Baseman, consulted and appreciated- s/p transnasal fiberoptic laryngoscopy: posterior vocal folds have irregular inflammatory changes at the vocal processes. -patient was given 2 doses of IV decadron -As above, overnight patient developed increasing stridor.  ENT reevaluated patient with bedside laryngoscopy and found posterior supraglottic swelling.  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  Plan was for the OR today for direct laryngoscopy and possible tracheostomy. -s/p tracheostomy  -ENT will plan to follow up with patient as an outpatient in a few weeks to evaluate larynx,  healing, and possible decannulation -Currently patient on trach collar and tolerating it well -speech therapy to evaluate patient  Intentional overdose with suicidal ideation -required intubation and is now extubated -psychiatry consulted and appreciated, recommended inpatient psych admission -Currently has safety sitter -hold wellbutrin, neurontin, atarax, trazodone- given trach- pending speech eval  Acute toxic encephalopathy -with inability to protect airway/in the setting of intentional overdose requiring intubation -drug screen negative -CT head unremarkable  -Improved, appears to be at baseline  Acute pulmonary edema -Improved, was on IV lasix -CXR on 3/21 reviewed and unremarkable for infection or edema  Shortness of breath and chest pain -SOB resolved with trach placement -no further complaints of chest pain -Chest x-ray, EKG, troponin, d-dimer are unremarkable -Most likely contributed by anxiety  Hypotension -Resolved, patient has a history of hypertension -BP stable, 133/75 -Continue to monitor   Acute kidney injury and hyperkalemia -Resolved, continue to monitor BMP  Hypomagnesemia -resolved with replacement -currently 1.8 -continue to monitor and replace as needed  Seizure disorder -currently stable  Anxiety/depression -needs inpatient psych admission -medications as above  Diabetes mellitus, type II with hyperglycemia -currently on steroids -continue Lantus, ISS, CBG monitoring   DVT Prophylaxis  lovenox  Code Status: Full  Family Communication: None at bedside  Disposition Plan: Admitted, dispo pending given trach status. Will likely have patient reevaluated by psych regarding inpatient psych admission.  Consultants PCCM Psychiatry  ENT  Procedures  Intubation/extubation Renal US transnasal fiberoptic laryngoscopy x 2 Tracheostomy and direct laryngoscopy   Antibiotics   Anti-infectives (From admission, onward)   None       Subjective:   [name] seen and examined today.  States she is hungry and wants to eat. No longer complaining of shortness of breath. Denies chest pain, abdominal pain, N/V/D/C.   Objective:   Vitals:  11/29/17 0700 11/29/17 0757 11/29/17 0800 11/29/17 0801  BP: 136/82  133/75 133/75  Pulse: (!) 106  97 (!) 107  Resp: 17  (!) 23 19  Temp: 98.9 F (37.2 C)     TempSrc: Oral     SpO2: 96% 100% 97%   Weight:      Height:        Intake/Output Summary (Last 24 hours) at 11/29/2017 1027 Last data filed at 11/29/2017 0900 Gross per 24 hour  Intake 570 ml  Output 2025 ml  Net -1455 ml   Filed Weights   11/27/17 0603 11/28/17 1100 11/29/17 0600  Weight: (!) 140.1 kg (308 lb 14.4 oz) (!) 138.9 kg (306 lb 3.5 oz) (!) 136.5 kg (300 lb 14.9 oz)   Exam  General: Well developed, well nourished, morbidly obese, NAD  HEENT: NCAT,  mucous membranes moist.   Neck: Trach in place  Cardiovascular: S1 S2 auscultated, RRR, no murmur  Respiratory: diminished breath sounds, +rhonchi  Abdomen: Soft, obese, nontender, nondistended, + bowel sounds  Extremities: warm dry without cyanosis clubbing or edema  Neuro: AAOx3, nonfocal  Psych: appropriate mood and affect  Data Reviewed: I have personally reviewed following labs and imaging studies  CBC: Recent Labs  Lab 11/23/17 0350 11/24/17 0448 11/26/17 0520 11/29/17 0409  WBC 12.5* 12.4* 12.2* 17.3*  HGB 13.1 13.5 13.5 12.7  HCT 41.3 42.3 42.3 39.1  MCV 91.0 89.8 91.2 90.1  PLT 344 310 372 099*   Basic Metabolic Panel: Recent Labs  Lab 11/24/17 0448 11/25/17 0733 11/26/17 0520 11/27/17 0220 11/27/17 0756 11/29/17 0409  NA 140 138 138 136 138 138  K 4.0 3.9 4.6 4.8 4.7 4.4  CL 103 101 102 100* 101 97*  CO2 23 24 23 26 29 29   GLUCOSE 225* 290* 317* 338* 350* 266*  BUN 25* 25* 24* 19 18 22*  CREATININE 1.00 0.87 0.91 0.80 0.79 0.83  CALCIUM 9.4 9.3 9.5 9.8 9.9 9.4  MG 1.6* 1.7 1.6*  --  1.9 1.8  PHOS  --   --    --   --   --  3.5   GFR: Estimated Creatinine Clearance: 118.1 mL/min (by C-G formula based on SCr of 0.83 mg/dL). Liver Function Tests: No results for input(s): AST, ALT, ALKPHOS, BILITOT, PROT, ALBUMIN in the last 168 hours. No results for input(s): LIPASE, AMYLASE in the last 168 hours. No results for input(s): AMMONIA in the last 168 hours. Coagulation Profile: No results for input(s): INR, PROTIME in the last 168 hours. Cardiac Enzymes: Recent Labs  Lab 11/25/17 1220  TROPONINI <0.03   BNP (last 3 results) No results for input(s): PROBNP in the last 8760 hours. HbA1C: No results for input(s): HGBA1C in the last 72 hours. CBG: Recent Labs  Lab 11/28/17 0854 11/28/17 1233 11/28/17 1609 11/28/17 2247 11/29/17 0819  GLUCAP 306* 360* 367* 255* 255*   Lipid Profile: No results for input(s): CHOL, HDL, LDLCALC, TRIG, CHOLHDL, LDLDIRECT in the last 72 hours. Thyroid Function Tests: No results for input(s): TSH, T4TOTAL, FREET4, T3FREE, THYROIDAB in the last 72 hours. Anemia Panel: No results for input(s): VITAMINB12, FOLATE, FERRITIN, TIBC, IRON, RETICCTPCT in the last 72 hours. Urine analysis:    Component Value Date/Time   COLORURINE RED (A) 10/31/2016 2130   APPEARANCEUR CLOUDY (A) 10/31/2016 2130   LABSPEC 1.027 10/31/2016 2130   PHURINE 6.0 10/31/2016 2130   GLUCOSEU >=500 (A) 10/31/2016 2130   HGBUR LARGE (A) 10/31/2016 2130   BILIRUBINUR  NEGATIVE 10/31/2016 2130   [place] NEGATIVE 10/31/2016 2130   PROTEINUR 100 (A) 10/31/2016 2130   UROBILINOGEN 0.2 04/09/2014 1157   NITRITE NEGATIVE 10/31/2016 2130   LEUKOCYTESUR NEGATIVE 10/31/2016 2130   Sepsis Labs: @LABRCNTIP (procalcitonin:4,lacticidven:4)  ) Recent Results (from the past 240 hour(s))  MRSA PCR Screening     Status: None   Collection Time: 11/20/17  5:13 AM  Result Value Ref Range Status   MRSA by PCR NEGATIVE NEGATIVE Final    Comment:        The GeneXpert MRSA Assay (FDA approved for NASAL  specimens only), is one component of a comprehensive MRSA colonization surveillance program. It is not intended to diagnose MRSA infection nor to guide or monitor treatment for MRSA infections. Performed at [facility], [place] [address] [place], [place] [address]   MRSA PCR Screening     Status: None   Collection Time: 11/28/17 11:17 AM  Result Value Ref Range Status   MRSA by PCR NEGATIVE NEGATIVE Final    Comment:        The GeneXpert MRSA Assay (FDA approved for NASAL specimens only), is one component of a comprehensive MRSA colonization surveillance program. It is not intended to diagnose MRSA infection nor to guide or monitor treatment for MRSA infections. Performed at [facility], [place] [address] [place], [place] [address]       Radiology Studies: Dg Chest Port 1 View  Result Date: 11/28/2017 CLINICAL DATA:  Tracheostomy placement. EXAM: PORTABLE CHEST 1 VIEW COMPARISON:  11/25/2017. FINDINGS: Tracheostomy tube noted with its tip over the trachea. Cardiomegaly with normal pulmonary vascularity. Low lung volumes with mild basilar atelectasis. No pleural effusion or pneumothorax. No acute bony abnormality. IMPRESSION: Tracheostomy tube noted with tip over the trachea. 2.  Stable cardiomegaly.  No pulmonary venous congestion. 3.  No acute pulmonary disease. Electronically Signed   By: Marcello Moores  Register   On: 11/28/2017 13:32     Scheduled Meds: . buPROPion  100 mg Oral BID  . cholestyramine  4 g Oral QODAY  . enoxaparin (LOVENOX) injection  60 mg Subcutaneous Q24H  . gabapentin  300 mg Oral BID  . insulin aspart  0-20 Units Subcutaneous TID WC  . insulin aspart  0-5 Units Subcutaneous QHS  . insulin aspart  8 Units Subcutaneous TID WC  . insulin glargine  38 Units Subcutaneous QHS  . ipratropium-albuterol  3 mL Nebulization TID  . levothyroxine  75 mcg Oral QAC breakfast  . lidocaine  4 mL Intradermal Once  . methylPREDNISolone  16 mg Oral Q breakfast    Followed by  . [START ON 11/30/2017] methylPREDNISolone  12 mg Oral Q breakfast   Followed by  . [START ON 12/01/2017] methylPREDNISolone  8 mg Oral Q breakfast   Followed by  . [START ON 12/02/2017] methylPREDNISolone  4 mg Oral Q breakfast  . pantoprazole (PROTONIX) IV  40 mg Intravenous BID  . protein supplement shake  11 oz Oral BID BM  . Racepinephrine HCl  0.5 mL Nebulization Once   Continuous Infusions: . sodium chloride 10 mL/hr at 11/29/17 0900     LOS: 9 days   Time Spent in minutes   30 minutes  Yarelie Hams D.O. on 11/29/2017 at 10:27 AM  Between 7am to 7pm - Pager - [phone]  After 7pm go to [email or web address] - password [email or web address]  And look for the night coverage person covering for me after hours  [facility] Office  [phone]

## 2017-11-29 NOTE — Progress Notes (Signed)
Patient ID: [name], female   DOB: 1975-12-23, 42 y.o.   MRN: [id number]  Postop day 1, stable, breathing much easier.  Tracheostomy in good position and secured in place with sutures and Velcro straps.  Continue care.  I will plan outpatient follow-up in a few weeks to evaluate the larynx and see if things are healing enough for her to be decannulated although it is possible that this may be a long-term process.

## 2017-11-29 NOTE — Progress Notes (Signed)
Patient is ambulating in the hall with assistance.  Tracheostomy has been well-tolerated and medical issues are being managed by the internal medicine service.  We will be available for assistance with issues related to her tracheostomy however the critical care service will not routinely follow.  Please reconsult [place] if needed.

## 2017-11-30 LAB — BASIC METABOLIC PANEL
Anion gap: 15 (ref 5–15)
BUN: 27 mg/dL — AB (ref 6–20)
CALCIUM: 9.4 mg/dL (ref 8.9–10.3)
CO2: 28 mmol/L (ref 22–32)
CREATININE: 0.84 mg/dL (ref 0.44–1.00)
Chloride: 96 mmol/L — ABNORMAL LOW (ref 101–111)
GFR calc Af Amer: >60 mL/min (ref >60)
Glucose, Bld: 292 mg/dL — ABNORMAL HIGH (ref 65–99)
Potassium: 4.7 mmol/L (ref 3.5–5.1)
Sodium: 139 mmol/L (ref 135–145)

## 2017-11-30 LAB — GLUCOSE, CAPILLARY
GLUCOSE-CAPILLARY: 280 mg/dL — AB (ref 65–99)
GLUCOSE-CAPILLARY: 289 mg/dL — AB (ref 65–99)
GLUCOSE-CAPILLARY: 290 mg/dL — AB (ref 65–99)
Glucose-Capillary: 284 mg/dL — ABNORMAL HIGH (ref 65–99)
Glucose-Capillary: 275 mg/dL — ABNORMAL HIGH (ref 65–99)

## 2017-11-30 LAB — CBC
HCT: 43.5 % (ref 36.0–46.0)
Hemoglobin: 13.5 g/dL (ref 12.0–15.0)
MCH: 28.7 pg (ref 26.0–34.0)
MCHC: 31 g/dL (ref 30.0–36.0)
MCV: 92.4 fL (ref 78.0–100.0)
PLATELETS: 403 10*3/uL — AB (ref 150–400)
RBC: 4.71 MIL/uL (ref 3.87–5.11)
RDW: 12.8 % (ref 11.5–15.5)
WBC: 18.5 10*3/uL — AB (ref 4.0–10.5)

## 2017-11-30 LAB — MAGNESIUM: Magnesium: 2.1 mg/dL (ref 1.7–2.4)

## 2017-11-30 MED ORDER — INSULIN ASPART 100 UNIT/ML ~~LOC~~ SOLN
0.0000 [IU] | SUBCUTANEOUS | Status: DC
  Administered 2017-11-30 – 2017-12-01 (×4): 11 [IU] via SUBCUTANEOUS
  Administered 2017-12-01: 15 [IU] via SUBCUTANEOUS
  Administered 2017-12-01 (×3): 7 [IU] via SUBCUTANEOUS
  Administered 2017-12-01 – 2017-12-02 (×2): 4 [IU] via SUBCUTANEOUS
  Administered 2017-12-02 (×2): 11 [IU] via SUBCUTANEOUS
  Administered 2017-12-02: 7 [IU] via SUBCUTANEOUS
  Administered 2017-12-02: 11 [IU] via SUBCUTANEOUS
  Administered 2017-12-02: 7 [IU] via SUBCUTANEOUS
  Administered 2017-12-03: 11 [IU] via SUBCUTANEOUS
  Administered 2017-12-03: 3 [IU] via SUBCUTANEOUS
  Administered 2017-12-03 (×2): 7 [IU] via SUBCUTANEOUS
  Administered 2017-12-03 – 2017-12-04 (×3): 4 [IU] via SUBCUTANEOUS
  Administered 2017-12-04 (×2): 7 [IU] via SUBCUTANEOUS
  Administered 2017-12-04: 3 [IU] via SUBCUTANEOUS
  Administered 2017-12-04 (×2): 4 [IU] via SUBCUTANEOUS
  Administered 2017-12-05 (×3): 3 [IU] via SUBCUTANEOUS
  Administered 2017-12-06 – 2017-12-07 (×3): 11 [IU] via SUBCUTANEOUS

## 2017-11-30 NOTE — Progress Notes (Signed)
Cortrak Tube Team Note:  Consult received to place a Cortrak feeding tube.   Underwent multiple attempt to place tube. Both nares were attempted, though pt L nare was extremely sensitive and pt unable to tolerate even passage of tube. R nare was more tolerable, though every attempt seemed to divert into airway. Despite patient swallowing, chin tuck and repositioning, all attempts at progressing tube led to severe coughing episodes.   Recommend placement under fluoroscopy.   Burtis Junes RD, LDN, CNSC Clinical Nutrition Pager: [phone] 11/30/2017 10:49 AM

## 2017-11-30 NOTE — Progress Notes (Signed)
Sitting in chair

## 2017-11-30 NOTE — Progress Notes (Signed)
Patient transferred to 3W05 with belonging and SI sitter. RN at bedside. All respiratory equipment handed off including trach supplies.

## 2017-11-30 NOTE — Progress Notes (Signed)
PROGRESS NOTE    [name]  OJJ:[id number] DOB: [birth year] DOA: 11/20/2017 PCP: Sandi Mariscal, MD   Brief Narrative:  42 year old morbidly obese female with history of Cushing's disease, diabetes on insulin, depression, hypertension, OSA who was admitted on 3/13 to ICU for intestinal drug overdose.  Patient took extra pills of baclofen, trazodone and tramadol.  Patient became encephalopathic and had generalized seizure in ER therefore patient was intubated and admitted by pulmonary critical care.  Patient was evaluated by psychiatrist who recommended inpatient psych on discharge.  Started on diuretics for pulmonary congestion.  Transferred to [facility] on 11/24/2017. Patient developed stridor and was seen by ENT, found to have irregular inflammatory vocal cord changes. ENT recommended medrol dose pack and PPI.  Overnight patient had increasing stridor. Rapid response was consulted, patient given racemic epi. 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 This morning, patient began to have more respiratory distress. Ordered BiPAP. PCCM and anesthesia at bedside for possible intubation. S/p tracheostomy.  Assessment & Plan   Acute respiratory failure secondary to stridor/Glottic obstruction  -ENT, Dr. Redmond Baseman, consulted and appreciated- s/p transnasal fiberoptic laryngoscopy: posterior vocal folds have irregular inflammatory changes at the vocal processes. -patient was given 2 doses of IV decadron -As above, overnight patient developed increasing stridor.  ENT reevaluated patient with bedside laryngoscopy and found posterior supraglottic swelling.  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  Plan was for the OR today for direct laryngoscopy and possible tracheostomy. -s/p tracheostomy  -ENT will plan to follow up with patient as an outpatient in a few weeks to evaluate larynx,  healing, and possible decannulation -Currently patient on trach collar and tolerating it well -speech therapy recommended NPO and placement of Cortrak under fluoroscopy  -have placed patient on IV solumedrol  Intentional overdose with suicidal ideation -required intubation and is now extubated -psychiatry consulted and appreciated, recommended inpatient psych admission -Currently has safety sitter -hold wellbutrin, neurontin, atarax, trazodone- given NPO status -psych reevaluated patient on 3/22- still recommended inpatient psych admission  Acute toxic encephalopathy -with inability to protect airway/in the setting of intentional overdose requiring intubation -drug screen negative -CT head unremarkable  -Improved, appears to be at baseline  Acute pulmonary edema -Improved, was on IV lasix -CXR on 3/21 reviewed and unremarkable for infection or edema  Shortness of breath and chest pain -SOB resolved with trach placement -no further complaints of chest pain -Chest x-ray, EKG, troponin, d-dimer are unremarkable -Most likely contributed by anxiety  Hypotension -Resolved, patient has a history of hypertension -BP stable, 125/66 -Continue to monitor   Acute kidney injury and hyperkalemia -Resolved, Creatinine today 0.84 -continue to monitor BMP  Hypomagnesemia -resolved with replacement -currently 2.1 -continue to monitor and replace as needed   Seizure disorder -currently stable and on no medications -monitor closely given NPO status and psych meds cannot be administered  Anxiety/depression -needs inpatient psych admission -medications as above- currently held  Diabetes mellitus, type II with hyperglycemia -currently on steroids -continue Lantus, ISS, CBG monitoring- changed to q4h  Hypothyroidism -transitioned to IV synthroid   DVT Prophylaxis  lovenox  Code Status: Full  Family Communication: None at bedside  Disposition Plan: Admitted, dispo pending  given trach status. Will likely have patient reevaluated by psych regarding inpatient psych admission.  Consultants PCCM Psychiatry  ENT  Procedures  Intubation/extubation Renal US transnasal fiberoptic laryngoscopy x 2 Tracheostomy and direct laryngoscopy   Antibiotics   Anti-infectives (  From admission, onward)   None      Subjective:   [name] seen and examined today.  Not very interactive today. Upset she cannot eat.   Objective:   Vitals:   11/30/17 0443 11/30/17 0500 11/30/17 0804 11/30/17 0914  BP: 136/82  125/66   Pulse: 95  98 (!) 113  Resp: (!) 22  (!) 21 (!) 22  Temp: 98.4 F (36.9 C)     TempSrc: Oral     SpO2: 98%  98% 99%  Weight:  136.1 kg (300 lb)    Height:        Intake/Output Summary (Last 24 hours) at 11/30/2017 1103 Last data filed at 11/29/2017 2000 Gross per 24 hour  Intake 40 ml  Output 175 ml  Net -135 ml   Filed Weights   11/28/17 1100 11/29/17 0600 11/30/17 0500  Weight: (!) 138.9 kg (306 lb 3.5 oz) (!) 136.5 kg (300 lb 14.9 oz) 136.1 kg (300 lb)   Exam  General: Well developed, well nourished, morbidly obese, NAD  HEENT: NCAT, mucous membranes moist.   Neck: Trach  Cardiovascular: S1 S2 auscultated, RRR, no murmur  Respiratory: Diminished breath sounds  Abdomen: Soft, obese, nontender, nondistended, + bowel sounds  Extremities: warm dry without cyanosis clubbing or edema  Neuro: AAOx3, nonfocal  Psych: depressed   Data Reviewed: I have personally reviewed following labs and imaging studies  CBC: Recent Labs  Lab 11/24/17 0448 11/26/17 0520 11/29/17 0409 11/30/17 0640  WBC 12.4* 12.2* 17.3* 18.5*  HGB 13.5 13.5 12.7 13.5  HCT 42.3 42.3 39.1 43.5  MCV 89.8 91.2 90.1 92.4  PLT 310 372 460* 326*   Basic Metabolic Panel: Recent Labs  Lab 11/25/17 0733 11/26/17 0520 11/27/17 0220 11/27/17 0756 11/29/17 0409 11/30/17 0640  NA 138 138 136 138 138 139  K 3.9 4.6 4.8 4.7 4.4 4.7  CL 101 102 100* 101  97* 96*  CO2 24 23 26 29 29 28   GLUCOSE 290* 317* 338* 350* 266* 292*  BUN 25* 24* 19 18 22* 27*  CREATININE 0.87 0.91 0.80 0.79 0.83 0.84  CALCIUM 9.3 9.5 9.8 9.9 9.4 9.4  MG 1.7 1.6*  --  1.9 1.8 2.1  PHOS  --   --   --   --  3.5  --    GFR: Estimated Creatinine Clearance: 116.4 mL/min (by C-G formula based on SCr of 0.84 mg/dL). Liver Function Tests: No results for input(s): AST, ALT, ALKPHOS, BILITOT, PROT, ALBUMIN in the last 168 hours. No results for input(s): LIPASE, AMYLASE in the last 168 hours. No results for input(s): AMMONIA in the last 168 hours. Coagulation Profile: No results for input(s): INR, PROTIME in the last 168 hours. Cardiac Enzymes: Recent Labs  Lab 11/25/17 1220  TROPONINI <0.03   BNP (last 3 results) No results for input(s): PROBNP in the last 8760 hours. HbA1C: No results for input(s): HGBA1C in the last 72 hours. CBG: Recent Labs  Lab 11/29/17 1156 11/29/17 1709 11/29/17 2009 11/30/17 0410 11/30/17 0609  GLUCAP 256* 249* 238* 284* 280*   Lipid Profile: No results for input(s): CHOL, HDL, LDLCALC, TRIG, CHOLHDL, LDLDIRECT in the last 72 hours. Thyroid Function Tests: No results for input(s): TSH, T4TOTAL, FREET4, T3FREE, THYROIDAB in the last 72 hours. Anemia Panel: No results for input(s): VITAMINB12, FOLATE, FERRITIN, TIBC, IRON, RETICCTPCT in the last 72 hours. Urine analysis:    Component Value Date/Time   COLORURINE RED (A) 10/31/2016 2130   APPEARANCEUR CLOUDY (  A) 10/31/2016 2130   LABSPEC 1.027 10/31/2016 2130   PHURINE 6.0 10/31/2016 2130   GLUCOSEU >=500 (A) 10/31/2016 2130   HGBUR LARGE (A) 10/31/2016 2130   BILIRUBINUR NEGATIVE 10/31/2016 2130   KETONESUR NEGATIVE 10/31/2016 2130   PROTEINUR 100 (A) 10/31/2016 2130   UROBILINOGEN 0.2 04/09/2014 1157   NITRITE NEGATIVE 10/31/2016 2130   LEUKOCYTESUR NEGATIVE 10/31/2016 2130   Sepsis Labs: @LABRCNTIP (procalcitonin:4,lacticidven:4)  ) Recent Results (from the past 240  hour(s))  MRSA PCR Screening     Status: None   Collection Time: 11/28/17 11:17 AM  Result Value Ref Range Status   MRSA by PCR NEGATIVE NEGATIVE Final    Comment:        The GeneXpert MRSA Assay (FDA approved for NASAL specimens only), is one component of a comprehensive MRSA colonization surveillance program. It is not intended to diagnose MRSA infection nor to guide or monitor treatment for MRSA infections. Performed at [facility], [place] [address], [place], [place] [address]       Radiology Studies: Dg Chest Port 1 View  Result Date: 11/28/2017 CLINICAL DATA:  Tracheostomy placement. EXAM: PORTABLE CHEST 1 VIEW COMPARISON:  11/25/2017. FINDINGS: Tracheostomy tube noted with its tip over the trachea. Cardiomegaly with normal pulmonary vascularity. Low lung volumes with mild basilar atelectasis. No pleural effusion or pneumothorax. No acute bony abnormality. IMPRESSION: Tracheostomy tube noted with tip over the trachea. 2.  Stable cardiomegaly.  No pulmonary venous congestion. 3.  No acute pulmonary disease. Electronically Signed   By: Marcello Moores  Register   On: 11/28/2017 13:32   Dg Swallowing Func-speech Pathology  Result Date: 11/29/2017 Objective Swallowing Evaluation: Type of Study: MBS-Modified Barium Swallow Study  Patient Details Name: [name] MRN: [id number] Date of Birth: 1976-09-10 Today's Date: 11/29/2017 Time: SLP Start Time (ACUTE ONLY): 1410 -SLP Stop Time (ACUTE ONLY): 1430 SLP Time Calculation (min) (ACUTE ONLY): 20 min Past Medical History: Past Medical History: Diagnosis Date . Anxiety  . Complication of anesthesia   Pt. states takes long time to wake up from it.  . Cushing's disease ([place])  . Depression  . Diabetes ([place])  . Hyperlipidemia  . Hyperlipidemia  . Hypertension  . Morbid obesity ([place])  . Osteoporosis 07/19/2015 . 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 Sleep apnea  . Uncontrolled diabetes mellitus  with diabetic neuropathy, with long-term current use of insulin ([place]) 07/16/2015 . Vitamin D deficiency 07/19/2015 Past Surgical History: Past Surgical History: Procedure Laterality Date . ANTERIOR TALOFIBULAR LIGAMENT REPAIR Left 11/15/2014  Procedure: ANTERIOR TALOFIBULAR LIGAMENT REPAIR;  Surgeon: Jana Half, DPM;  Location: [facility];  Service: Podiatry;  Laterality: Left; . CESAREAN SECTION  dec 1997/  06-03-2001/   01-01-2005  BILATERAL TUBAL LIGATION WITH LAST ONE . DILATION AND CURETTAGE OF UTERUS  1995  WITH SUCTION . ESOPHAGOGASTRODUODENOSCOPY (EGD) WITH PROPOFOL N/A 10/04/2016  Procedure: ESOPHAGOGASTRODUODENOSCOPY (EGD) WITH PROPOFOL;  Surgeon: [name], MD;  Location: [facility];  Service: Endoscopy;  Laterality: N/A; . LAPAROSCOPIC CHOLECYSTECTOMY  09-25-2005 . ORIF TIBIA PLATEAU Right 07/19/2015  Procedure: OPEN REDUCTION INTERNAL FIXATION (ORIF) RIGHT TIBIAL PLATEAU;  Surgeon: Altamese [place], MD;  Location: [facility];  Service: Orthopedics;  Laterality: Right; . PARTIAL KNEE ARTHROPLASTY Right 04/19/2014  Procedure: RIGHT UNI KNEE ARTHROPLASTY MEDIALLY ;  Surgeon: Mauri Pole, MD;  Location: [facility];  Service: Orthopedics;  Laterality: Right; . TUBAL LIGATION   HPI: Patient is a 42 y/o female who  presents with drug OD and Sz. Intubated 3/13-3/15. resp failure with emergent trach 3/21.  ENT flexible laryngoscopy revealed posterior supraglottic swelling; swollen vocal folds with no abduction; edematous arytenoids; entire subglottis circumferentially swollen with necrotic debris and fibrinous exudate.  Subjective: alert Assessment / Plan / Recommendation CHL IP CLINICAL IMPRESSIONS 11/29/2017 Clinical Impression Pt presents with pharyngeal dysphagia secondary to significant edema at level of larynx, preventing airway protection and allowing penetration of thin liquids and purees into the larynx to the level of the vocal cords.  Pt unable to use PMV given poor upper airway access;  cued cough is not effective given absence of subglottic pressure.  Recommend continued NPO for now; pt will likely need NG for nutrition until edema subsides.  Recommend placing it under fluoroscopy due to swelling at level of UES.  D/W RN, pt and her husband.  SLP will follow for improvements/readiness.  After oral care, pt may have limited ice chips for pleasure. SLP Visit Diagnosis Dysphagia, unspecified (R13.10) Attention and concentration deficit following -- Frontal lobe and executive function deficit following -- Impact on safety and function Moderate aspiration risk   CHL IP TREATMENT RECOMMENDATION 11/29/2017 Treatment Recommendations Therapy as outlined in treatment plan below   Prognosis 11/29/2017 Prognosis for Safe Diet Advancement Good Barriers to Reach Goals -- Barriers/Prognosis Comment -- CHL IP DIET RECOMMENDATION 11/29/2017 SLP Diet Recommendations NPO;Ice chips PRN after oral care Liquid Administration via -- Medication Administration Via alternative means Compensations -- Postural Changes --   CHL IP OTHER RECOMMENDATIONS 11/29/2017 Recommended Consults -- Oral Care Recommendations Oral care QID Other Recommendations --   No flowsheet data found.  CHL IP FREQUENCY AND DURATION 11/29/2017 Speech Therapy Frequency (ACUTE ONLY) min 3x week Treatment Duration 2 weeks      CHL IP ORAL PHASE 11/29/2017 Oral Phase WFL Oral - Pudding Teaspoon -- Oral - Pudding Cup -- Oral - Honey Teaspoon -- Oral - Honey Cup -- Oral - Nectar Teaspoon -- Oral - Nectar Cup -- Oral - Nectar Straw -- Oral - Thin Teaspoon -- Oral - Thin Cup -- Oral - Thin Straw -- Oral - Puree -- Oral - Mech Soft -- Oral - Regular -- Oral - Multi-Consistency -- Oral - Pill -- Oral Phase - Comment --  CHL IP PHARYNGEAL PHASE 11/29/2017 Pharyngeal Phase Impaired Pharyngeal- Pudding Teaspoon -- Pharyngeal -- Pharyngeal- Pudding Cup -- Pharyngeal -- Pharyngeal- Honey Teaspoon -- Pharyngeal -- Pharyngeal- Honey Cup -- Pharyngeal -- Pharyngeal- Nectar  Teaspoon -- Pharyngeal -- Pharyngeal- Nectar Cup -- Pharyngeal -- Pharyngeal- Nectar Straw -- Pharyngeal -- Pharyngeal- Thin Teaspoon -- Pharyngeal -- Pharyngeal- Thin Cup -- Pharyngeal -- Pharyngeal- Thin Straw Reduced epiglottic inversion;Reduced anterior laryngeal mobility;Reduced laryngeal elevation;Reduced airway/laryngeal closure;Penetration/Aspiration during swallow Pharyngeal Material enters airway, CONTACTS cords and then ejected out Pharyngeal- Puree Reduced epiglottic inversion;Reduced anterior laryngeal mobility;Reduced laryngeal elevation;Reduced airway/laryngeal closure;Penetration/Aspiration during swallow Pharyngeal Material enters airway, CONTACTS cords and not ejected out Pharyngeal- Mechanical Soft -- Pharyngeal -- Pharyngeal- Regular -- Pharyngeal -- Pharyngeal- Multi-consistency -- Pharyngeal -- Pharyngeal- Pill -- Pharyngeal -- Pharyngeal Comment --  No flowsheet data found. No flowsheet data found. [name] 11/29/2017, 5:07 PM                Scheduled Meds: . buPROPion  100 mg Oral BID  . chlorhexidine  15 mL Mouth Rinse BID  . cholestyramine  4 g Oral QODAY  . enoxaparin (LOVENOX) injection  60 mg Subcutaneous Q24H  . gabapentin  300 mg Oral BID  .  insulin aspart  0-20 Units Subcutaneous Q4H  . insulin glargine  38 Units Subcutaneous QHS  . ipratropium-albuterol  3 mL Nebulization TID  . levothyroxine  37.5 mcg Intravenous Daily  . lidocaine  4 mL Intradermal Once  . mouth rinse  15 mL Mouth Rinse q12n4p  . methylPREDNISolone (SOLU-MEDROL) injection  40 mg Intravenous Daily  . pantoprazole (PROTONIX) IV  40 mg Intravenous BID  . protein supplement shake  11 oz Oral BID BM  . Racepinephrine HCl  0.5 mL Nebulization Once   Continuous Infusions:    LOS: 10 days   Time Spent in minutes   30 minutes  [name] D.O. on 11/30/2017 at 11:03 AM  Between 7am to 7pm - Pager - [phone]  After 7pm go to [email or web address] - password [email or web address]  And look for  the night coverage person covering for me after hours  [facility] Office  [phone]

## 2017-12-01 LAB — GLUCOSE, CAPILLARY
GLUCOSE-CAPILLARY: 205 mg/dL — AB (ref 65–99)
GLUCOSE-CAPILLARY: 236 mg/dL — AB (ref 65–99)
GLUCOSE-CAPILLARY: 305 mg/dL — AB (ref 65–99)
Glucose-Capillary: 198 mg/dL — ABNORMAL HIGH (ref 65–99)
Glucose-Capillary: 219 mg/dL — ABNORMAL HIGH (ref 65–99)
Glucose-Capillary: 197 mg/dL — ABNORMAL HIGH (ref 65–99)
Glucose-Capillary: 285 mg/dL — ABNORMAL HIGH (ref 65–99)

## 2017-12-01 MED ORDER — SODIUM CHLORIDE 0.9% FLUSH
10.0000 mL | INTRAVENOUS | Status: DC | PRN
  Administered 2017-12-03 (×2): 10 mL
  Filled 2017-12-01 (×2): qty 40

## 2017-12-01 NOTE — Progress Notes (Addendum)
PROGRESS NOTE    [name]  KTG:[id number] DOB: 04-23-1976 DOA: 11/20/2017 PCP: Sandi Mariscal, MD   Brief Narrative:  42 year old morbidly obese female with history of Cushing's disease, diabetes on insulin, depression, hypertension, OSA who was admitted on 3/13 to ICU for intestinal drug overdose.  Patient took extra pills of baclofen, trazodone and tramadol.  Patient became encephalopathic and had generalized seizure in ER therefore patient was intubated and admitted by pulmonary critical care.  Patient was evaluated by psychiatrist who recommended inpatient psych on discharge.  Started on diuretics for pulmonary congestion.  Transferred to [facility] on 11/24/2017. Patient developed stridor and was seen by ENT, found to have irregular inflammatory vocal cord changes. ENT recommended medrol dose pack and PPI.  Patient developed had increasing stridor. Rapid response was consulted, patient given racemic epi. 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 This morning, patient began to have more respiratory distress. Ordered BiPAP. PCCM and anesthesia at bedside for possible intubation. S/p tracheostomy.    Pyschiatry reevaluated patient and still recommending inpatient psychiatric admission.  Assessment & Plan   Acute respiratory failure secondary to stridor/Glottic obstruction  -ENT, Dr. Redmond Baseman, consulted and appreciated- s/p transnasal fiberoptic laryngoscopy: posterior vocal folds have irregular inflammatory changes at the vocal processes. -patient was given 2 doses of IV decadron -As above, overnight patient developed increasing stridor.  ENT reevaluated patient with bedside laryngoscopy and found posterior supraglottic swelling.  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   -s/p tracheostomy  -ENT will plan to follow up with patient as an outpatient in a few weeks to evaluate  larynx, healing, and possible decannulation -Suspect patient should be reevaluated by ENT while in the hospital -Currently patient on trach collar and tolerating it well -speech therapy recommended NPO and placement of Cortrak under fluoroscopy  -have placed patient on IV solumedrol -trach aspirate culture pending  Intentional overdose with suicidal ideation -required intubation and is now extubated -psychiatry consulted and appreciated, recommended inpatient psych admission -Currently has safety sitter -hold wellbutrin, neurontin, atarax, trazodone- given NPO status -psych reevaluated patient on 3/22- still recommended inpatient psych admission  Acute toxic encephalopathy -with inability to protect airway/in the setting of intentional overdose requiring intubation -drug screen negative -CT head unremarkable  -Improved, appears to be at baseline  Acute pulmonary edema -Improved, was on IV lasix -CXR on 3/21 reviewed and unremarkable for infection or edema  Shortness of breath and chest pain -SOB resolved with trach placement -no further complaints of chest pain -Chest x-ray, EKG, troponin, d-dimer are unremarkable -Most likely contributed by anxiety  Hypotension -Resolved, patient has a history of hypertension -BP stable, 137/70 -Continue to monitor   Acute kidney injury and hyperkalemia -Resolved, Creatinine today 0.84 -continue to monitor BMP  Hypomagnesemia -resolved with replacement -currently 2.1 -continue to monitor and replace as needed   Seizure disorder -currently stable and on no medications -monitor closely given NPO status and psych meds cannot be administered  Anxiety/depression -needs inpatient psych admission -medications as above- currently held  Diabetes mellitus, type II with hyperglycemia -currently on steroids -continue Lantus, ISS, CBG monitoring- changed to q4h  Hypothyroidism -transitioned to IV synthroid as patient is currently  NPO  DVT Prophylaxis  lovenox  Code Status: Full  Family Communication: None at bedside  Disposition Plan: Admitted, dispo pending given trach status. Psychiatry recommended inpatient psych admission which will be difficult given trach.   Consultants PCCM Psychiatry  ENT  Procedures  Intubation/extubation Renal US transnasal fiberoptic laryngoscopy x 2 Tracheostomy and direct laryngoscopy   Antibiotics   Anti-infectives (From admission, onward)   None      Subjective:   [name] seen and examined today.  Patient states she is feeling better.  Still wants to eat and drink.  Denies current chest pain, shortness of breath, abdominal pain. Communicates by writing.   Objective:   Vitals:   12/01/17 0414 12/01/17 0455 12/01/17 0850 12/01/17 0900  BP: (!) 145/80 (!) 146/72  137/70  Pulse: 100     Resp: (!) 21  (!) 23 20  Temp:  97.8 F (36.6 C)  98.2 F (36.8 C)  TempSrc:  Axillary  Oral  SpO2: 100% 100%  98%  Weight:  (!) 137.2 kg (302 lb 7.5 oz)    Height:       No intake or output data in the 24 hours ending 12/01/17 1034 Filed Weights   11/29/17 0600 11/30/17 0500 12/01/17 0455  Weight: (!) 136.5 kg (300 lb 14.9 oz) 136.1 kg (300 lb) (!) 137.2 kg (302 lb 7.5 oz)   Exam  General: Well developed, well nourished, morbidly obese  HEENT: NCAT, mucous membranes moist.   Neck: Trach  Cardiovascular: S1 S2 auscultated, RRR, no murmur.  Respiratory: Clear to auscultation bilaterally with equal chest rise  Abdomen: Soft, obese, nontender, nondistended, + bowel sounds  Extremities: warm dry without cyanosis clubbing or edema  Neuro: AAOx3, nonfocal  Psych: Appropriate mood and affect  Data Reviewed: I have personally reviewed following labs and imaging studies  CBC: Recent Labs  Lab 11/26/17 0520 11/29/17 0409 11/30/17 0640  WBC 12.2* 17.3* 18.5*  HGB 13.5 12.7 13.5  HCT 42.3 39.1 43.5  MCV 91.2 90.1 92.4  PLT 372 460* 700*   Basic Metabolic  Panel: Recent Labs  Lab 11/25/17 0733 11/26/17 0520 11/27/17 0220 11/27/17 0756 11/29/17 0409 11/30/17 0640  NA 138 138 136 138 138 139  K 3.9 4.6 4.8 4.7 4.4 4.7  CL 101 102 100* 101 97* 96*  CO2 24 23 26 29 29 28   GLUCOSE 290* 317* 338* 350* 266* 292*  BUN 25* 24* 19 18 22* 27*  CREATININE 0.87 0.91 0.80 0.79 0.83 0.84  CALCIUM 9.3 9.5 9.8 9.9 9.4 9.4  MG 1.7 1.6*  --  1.9 1.8 2.1  PHOS  --   --   --   --  3.5  --    GFR: Estimated Creatinine Clearance: 116.9 mL/min (by C-G formula based on SCr of 0.84 mg/dL). Liver Function Tests: No results for input(s): AST, ALT, ALKPHOS, BILITOT, PROT, ALBUMIN in the last 168 hours. No results for input(s): LIPASE, AMYLASE in the last 168 hours. No results for input(s): AMMONIA in the last 168 hours. Coagulation Profile: No results for input(s): INR, PROTIME in the last 168 hours. Cardiac Enzymes: Recent Labs  Lab 11/25/17 1220  TROPONINI <0.03   BNP (last 3 results) No results for input(s): PROBNP in the last 8760 hours. HbA1C: No results for input(s): HGBA1C in the last 72 hours. CBG: Recent Labs  Lab 11/30/17 2012 12/01/17 0031 12/01/17 0431 12/01/17 0804 12/01/17 0814  GLUCAP 290* 236* 205* 198* 219*   Lipid Profile: No results for input(s): CHOL, HDL, LDLCALC, TRIG, CHOLHDL, LDLDIRECT in the last 72 hours. Thyroid Function Tests: No results for input(s): TSH, T4TOTAL, FREET4, T3FREE, THYROIDAB in the last 72 hours. Anemia Panel: No results for input(s): VITAMINB12, FOLATE, FERRITIN, TIBC, IRON, RETICCTPCT in the  last 72 hours. Urine analysis:    Component Value Date/Time   COLORURINE RED (A) 10/31/2016 2130   APPEARANCEUR CLOUDY (A) 10/31/2016 2130   LABSPEC 1.027 10/31/2016 2130   PHURINE 6.0 10/31/2016 2130   GLUCOSEU >=500 (A) 10/31/2016 2130   HGBUR LARGE (A) 10/31/2016 2130   BILIRUBINUR NEGATIVE 10/31/2016 2130   KETONESUR NEGATIVE 10/31/2016 2130   PROTEINUR 100 (A) 10/31/2016 2130   UROBILINOGEN 0.2  04/09/2014 1157   NITRITE NEGATIVE 10/31/2016 2130   LEUKOCYTESUR NEGATIVE 10/31/2016 2130   Sepsis Labs: @LABRCNTIP (procalcitonin:4,lacticidven:4)  ) Recent Results (from the past 240 hour(s))  MRSA PCR Screening     Status: None   Collection Time: 11/28/17 11:17 AM  Result Value Ref Range Status   MRSA by PCR NEGATIVE NEGATIVE Final    Comment:        The GeneXpert MRSA Assay (FDA approved for NASAL specimens only), is one component of a comprehensive MRSA colonization surveillance program. It is not intended to diagnose MRSA infection nor to guide or monitor treatment for MRSA infections. Performed at [facility], [place] [address], [place], [place] [address]   Culture, respiratory (NON-Expectorated)     Status: None (Preliminary result)   Collection Time: 11/30/17  4:28 AM  Result Value Ref Range Status   Specimen Description TRACHEAL ASPIRATE  Final   Special Requests NONE  Final   Gram Stain   Final    MODERATE WBC PRESENT, PREDOMINANTLY PMN FEW GRAM POSITIVE COCCI Performed at [facility], [place] [address] [place], [place] [address]    Culture PENDING  Incomplete   Report Status PENDING  Incomplete      Radiology Studies: Dg Swallowing Func-speech Pathology  Result Date: 11/29/2017 Objective Swallowing Evaluation: Type of Study: MBS-Modified Barium Swallow Study  Patient Details Name: [name] MRN: [id number] Date of Birth: Feb 10, 1976 Today's Date: 11/29/2017 Time: SLP Start Time (ACUTE ONLY): 1410 -SLP Stop Time (ACUTE ONLY): 1430 SLP Time Calculation (min) (ACUTE ONLY): 20 min Past Medical History: Past Medical History: Diagnosis Date . Anxiety  . Complication of anesthesia   Pt. states takes long time to wake up from it.  . Cushing's disease ([place])  . Depression  . Diabetes ([place])  . Hyperlipidemia  . Hyperlipidemia  . Hypertension  . Morbid obesity ([place])  . Osteoporosis 07/19/2015 . 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 Sleep apnea  . Uncontrolled diabetes mellitus with diabetic neuropathy, with long-term current use of insulin ([place]) 07/16/2015 . Vitamin D deficiency 07/19/2015 Past Surgical History: Past Surgical History: Procedure Laterality Date . ANTERIOR TALOFIBULAR LIGAMENT REPAIR Left 11/15/2014  Procedure: ANTERIOR TALOFIBULAR LIGAMENT REPAIR;  Surgeon: Jana Half, DPM;  Location: [facility];  Service: Podiatry;  Laterality: Left; . CESAREAN SECTION  dec 1997/  06-03-2001/   01-01-2005  BILATERAL TUBAL LIGATION WITH LAST ONE . DILATION AND CURETTAGE OF UTERUS  1995  WITH SUCTION . ESOPHAGOGASTRODUODENOSCOPY (EGD) WITH PROPOFOL N/A 10/04/2016  Procedure: ESOPHAGOGASTRODUODENOSCOPY (EGD) WITH PROPOFOL;  Surgeon: [name], MD;  Location: [facility];  Service: Endoscopy;  Laterality: N/A; . LAPAROSCOPIC CHOLECYSTECTOMY  09-25-2005 . ORIF TIBIA PLATEAU Right 07/19/2015  Procedure: OPEN REDUCTION INTERNAL FIXATION (ORIF) RIGHT TIBIAL PLATEAU;  Surgeon: Altamese Hillsview, MD;  Location: [facility];  Service: Orthopedics;  Laterality: Right; . PARTIAL KNEE ARTHROPLASTY Right 04/19/2014  Procedure: RIGHT UNI KNEE ARTHROPLASTY MEDIALLY ;  Surgeon: Mauri Pole, MD;  Location: [facility];  Service:  Orthopedics;  Laterality: Right; . TUBAL LIGATION   HPI: Patient is a 42 y/o female who presents with drug OD and Sz. Intubated 3/13-3/15. resp failure with emergent trach 3/21.  ENT flexible laryngoscopy revealed posterior supraglottic swelling; swollen vocal folds with no abduction; edematous arytenoids; entire subglottis circumferentially swollen with necrotic debris and fibrinous exudate.  Subjective: alert Assessment / Plan / Recommendation CHL IP CLINICAL IMPRESSIONS 11/29/2017 Clinical Impression Pt presents with pharyngeal dysphagia secondary to significant edema at level of larynx, preventing airway protection and allowing penetration of thin liquids and purees into the larynx to the level of  the vocal cords.  Pt unable to use PMV given poor upper airway access; cued cough is not effective given absence of subglottic pressure.  Recommend continued NPO for now; pt will likely need NG for nutrition until edema subsides.  Recommend placing it under fluoroscopy due to swelling at level of UES.  D/W RN, pt and her husband.  SLP will follow for improvements/readiness.  After oral care, pt may have limited ice chips for pleasure. SLP Visit Diagnosis Dysphagia, unspecified (R13.10) Attention and concentration deficit following -- Frontal lobe and executive function deficit following -- Impact on safety and function Moderate aspiration risk   CHL IP TREATMENT RECOMMENDATION 11/29/2017 Treatment Recommendations Therapy as outlined in treatment plan below   Prognosis 11/29/2017 Prognosis for Safe Diet Advancement Good Barriers to Reach Goals -- Barriers/Prognosis Comment -- CHL IP DIET RECOMMENDATION 11/29/2017 SLP Diet Recommendations NPO;Ice chips PRN after oral care Liquid Administration via -- Medication Administration Via alternative means Compensations -- Postural Changes --   CHL IP OTHER RECOMMENDATIONS 11/29/2017 Recommended Consults -- Oral Care Recommendations Oral care QID Other Recommendations --   No flowsheet data found.  CHL IP FREQUENCY AND DURATION 11/29/2017 Speech Therapy Frequency (ACUTE ONLY) min 3x week Treatment Duration 2 weeks      CHL IP ORAL PHASE 11/29/2017 Oral Phase WFL Oral - Pudding Teaspoon -- Oral - Pudding Cup -- Oral - Honey Teaspoon -- Oral - Honey Cup -- Oral - Nectar Teaspoon -- Oral - Nectar Cup -- Oral - Nectar Straw -- Oral - Thin Teaspoon -- Oral - Thin Cup -- Oral - Thin Straw -- Oral - Puree -- Oral - Mech Soft -- Oral - Regular -- Oral - Multi-Consistency -- Oral - Pill -- Oral Phase - Comment --  CHL IP PHARYNGEAL PHASE 11/29/2017 Pharyngeal Phase Impaired Pharyngeal- Pudding Teaspoon -- Pharyngeal -- Pharyngeal- Pudding Cup -- Pharyngeal -- Pharyngeal- Honey Teaspoon --  Pharyngeal -- Pharyngeal- Honey Cup -- Pharyngeal -- Pharyngeal- Nectar Teaspoon -- Pharyngeal -- Pharyngeal- Nectar Cup -- Pharyngeal -- Pharyngeal- Nectar Straw -- Pharyngeal -- Pharyngeal- Thin Teaspoon -- Pharyngeal -- Pharyngeal- Thin Cup -- Pharyngeal -- Pharyngeal- Thin Straw Reduced epiglottic inversion;Reduced anterior laryngeal mobility;Reduced laryngeal elevation;Reduced airway/laryngeal closure;Penetration/Aspiration during swallow Pharyngeal Material enters airway, CONTACTS cords and then ejected out Pharyngeal- Puree Reduced epiglottic inversion;Reduced anterior laryngeal mobility;Reduced laryngeal elevation;Reduced airway/laryngeal closure;Penetration/Aspiration during swallow Pharyngeal Material enters airway, CONTACTS cords and not ejected out Pharyngeal- Mechanical Soft -- Pharyngeal -- Pharyngeal- Regular -- Pharyngeal -- Pharyngeal- Multi-consistency -- Pharyngeal -- Pharyngeal- Pill -- Pharyngeal -- Pharyngeal Comment --  No flowsheet data found. No flowsheet data found. [name] 11/29/2017, 5:07 PM                Scheduled Meds: . buPROPion  100 mg Oral BID  . chlorhexidine  15 mL Mouth Rinse BID  . cholestyramine  4 g Oral QODAY  .  enoxaparin (LOVENOX) injection  60 mg Subcutaneous Q24H  . gabapentin  300 mg Oral BID  . insulin aspart  0-20 Units Subcutaneous Q4H  . insulin glargine  38 Units Subcutaneous QHS  . ipratropium-albuterol  3 mL Nebulization TID  . levothyroxine  37.5 mcg Intravenous Daily  . lidocaine  4 mL Intradermal Once  . mouth rinse  15 mL Mouth Rinse q12n4p  . methylPREDNISolone (SOLU-MEDROL) injection  40 mg Intravenous Daily  . pantoprazole (PROTONIX) IV  40 mg Intravenous BID  . protein supplement shake  11 oz Oral BID BM  . Racepinephrine HCl  0.5 mL Nebulization Once   Continuous Infusions:    LOS: 11 days   Time Spent in minutes   30 minutes  Mallie Linnemann D.O. on 12/01/2017 at 10:34 AM  Between 7am to 7pm - Pager -  [phone]  After 7pm go to [email or web address] - password [email or web address]  And look for the night coverage person covering for me after hours  [facility] Office  [phone]

## 2017-12-01 NOTE — Plan of Care (Signed)
POC discussed w/MD, pt and spouse, agreeable w/plan, denies questions/concerns at this time.

## 2017-12-01 NOTE — Progress Notes (Addendum)
   Subjective:    Patient ID: [name], female    DOB: 12-10-1975, 42 y.o.   MRN: [id number]  HPI Breathing comfortably.  Requesting a diet.  Review of Systems     Objective:   Physical Exam AF VSS Alert, NAD #7 adjustable length trach tube in place, sutures in place, makes some voice around occluded tube    Assessment & Plan:  Laryngeal inflammation, airway obstruction s/p tracheostomy  Discussed situation and plans with patient and her family.  Current trach tube will remain in place until she follows up as an outpatient.  Sutures can be removed mid week.  This is not a tube that can be easily changed safely.  Will reassess larynx in a couple of weeks in office.  Appreciate speech pathology assistance to assess swallow and move toward using a Passy-Muir valve.  Cuff will need to be kept deflated.

## 2017-12-02 ENCOUNTER — Telehealth (INDEPENDENT_AMBULATORY_CARE_PROVIDER_SITE_OTHER): Payer: Self-pay | Admitting: Orthopaedic Surgery

## 2017-12-02 ENCOUNTER — Inpatient Hospital Stay (HOSPITAL_COMMUNITY): Payer: Medicaid Other

## 2017-12-02 LAB — CBC
HEMATOCRIT: 46.6 % — AB (ref 36.0–46.0)
Hemoglobin: 15.1 g/dL — ABNORMAL HIGH (ref 12.0–15.0)
MCH: 29.5 pg (ref 26.0–34.0)
MCHC: 32.4 g/dL (ref 30.0–36.0)
MCV: 91.2 fL (ref 78.0–100.0)
Platelets: 456 10*3/uL — ABNORMAL HIGH (ref 150–400)
RBC: 5.11 MIL/uL (ref 3.87–5.11)
RDW: 13.2 % (ref 11.5–15.5)
WBC: 19.1 10*3/uL — ABNORMAL HIGH (ref 4.0–10.5)

## 2017-12-02 LAB — GLUCOSE, CAPILLARY
GLUCOSE-CAPILLARY: 264 mg/dL — AB (ref 65–99)
GLUCOSE-CAPILLARY: 278 mg/dL — AB (ref 65–99)
Glucose-Capillary: 237 mg/dL — ABNORMAL HIGH (ref 65–99)
Glucose-Capillary: 155 mg/dL — ABNORMAL HIGH (ref 65–99)
Glucose-Capillary: 246 mg/dL — ABNORMAL HIGH (ref 65–99)
Glucose-Capillary: 262 mg/dL — ABNORMAL HIGH (ref 65–99)

## 2017-12-02 LAB — COMPREHENSIVE METABOLIC PANEL
ALK PHOS: 104 U/L (ref 38–126)
ALT: 18 U/L (ref 14–54)
ANION GAP: 16 — AB (ref 5–15)
AST: 19 U/L (ref 15–41)
Albumin: 2.7 g/dL — ABNORMAL LOW (ref 3.5–5.0)
BILIRUBIN TOTAL: 0.8 mg/dL (ref 0.3–1.2)
BUN: 35 mg/dL — ABNORMAL HIGH (ref 6–20)
CALCIUM: 9.6 mg/dL (ref 8.9–10.3)
CO2: 27 mmol/L (ref 22–32)
Chloride: 96 mmol/L — ABNORMAL LOW (ref 101–111)
Creatinine, Ser: 1.02 mg/dL — ABNORMAL HIGH (ref 0.44–1.00)
GFR calc non Af Amer: >60 mL/min (ref >60)
Glucose, Bld: 243 mg/dL — ABNORMAL HIGH (ref 65–99)
Potassium: 4 mmol/L (ref 3.5–5.1)
Sodium: 139 mmol/L (ref 135–145)
TOTAL PROTEIN: 7.4 g/dL (ref 6.5–8.1)

## 2017-12-02 LAB — CULTURE, RESPIRATORY W GRAM STAIN

## 2017-12-02 LAB — CULTURE, RESPIRATORY: CULTURE: NORMAL

## 2017-12-02 LAB — MAGNESIUM: MAGNESIUM: 2.1 mg/dL (ref 1.7–2.4)

## 2017-12-02 MED ORDER — BISACODYL 10 MG RE SUPP
10.0000 mg | Freq: Once | RECTAL | Status: AC
  Administered 2017-12-02: 10 mg via RECTAL
  Filled 2017-12-02: qty 1

## 2017-12-02 NOTE — Progress Notes (Addendum)
Modified Barium Swallow Progress Note  Patient Details  Name: [name] MRN: [id number] Date of Birth: 04-05-1976  Today's Date: 12/02/2017  Modified Barium Swallow completed.  Full report located under Chart Review in the Imaging Section.  Brief recommendations include the following:  Clinical Impression  Pt continues to present with pharyngeal dysphagia secondary to significant edema at level of larynx, although improving as compared to previous study, preventing airway protection and allowing penetration of all tested liquids and pureed solids into the larynx to the level of the vocal cords without sensation. Chin tuck ineffective to increase airway protection. Assessed with PMV in place in which patient was initially able to tolerate without incidence, allowing for increased strength of throat clear although still inconsistently effective to clear the airway. Following approximately 5-6 minutes of use however, patient with increased use of accessory muscles, increased upper airway noise on exhalation and although vitals stable, removal of valve indicated mild CO2 retention.  Continued to assess swallow without valve in place and although initial airway protection largely unchanged, cued coughing in attempts to clear the airway increasingly ineffective given absence of subglottic pressure with open system. Continue to recommend NPO. After oral care, pt may have limited ice chips for pleasure. Will continue to f/u. Prognosis for ability to resume pos remains good with continued improvements in edema.    Swallow Evaluation Recommendations       SLP Diet Recommendations: NPO;Ice chips PRN after oral care       Medication Administration: Via alternative means               Oral Care Recommendations: Oral care QID      Gabriel Rainwater MA, [name] [phone]   Theressa Piedra Meryl 12/02/2017,3:01 PM

## 2017-12-02 NOTE — Progress Notes (Signed)
Nutrition Follow-up  DOCUMENTATION CODES:   Morbid obesity  INTERVENTION:  Monitor results of MBSS - may need 72F tube placed via fluoro if unable to meet needs.  If tube placed, recommend Jevity 1.2 begin at 65mL/hr, increase by 10 every 8 hours to goal rate of 43mL/hr Pro-stat 74mL BID  At goal rate, provides 2072 calories, 117 grams of protein, 1256mL free water  NUTRITION DIAGNOSIS:   Increased nutrient needs related to acute illness as evidenced by estimated needs. -ongoing  GOAL:   Patient will meet greater than or equal to 90% of their needs -unmet  MONITOR:   PO intake, I & O's, Weight trends, Labs, Supplement acceptance  ASSESSMENT:   42 yo female admitted with acute toxic encephalopathy secondary to intentional OD on baclofan, trazadone and  tramadol, seizures. Pt intubate for airway protection. Pt with hx of Cushing's, DM, OSA  3/21 - Underwent laryngoscopy and trached 3/22 - Still suffering from pharyngeal dysphagia due to edmea, unable to use PMSV. 3/23 - Cortrak unable to be placed due to nare sensitivity  She was being evaluated by speech today during visit. Modified barium swallow study scheduled for 1230 today - awaiting results.  Labs reviewed:  CBGs 246, 155 Medications reviewed and include:  Insulin Solumedrol  Diet Order:  Diet NPO time specified  EDUCATION NEEDS:   Not appropriate for education at this time  Skin:  Skin Assessment: Skin Integrity Issues: Skin Integrity Issues:: Other (Comment) Other: MASD: abdomen, bilateral groin, under breasts  Last BM:  11/23/2017  Height:   Ht Readings from Last 1 Encounters:  11/28/17 5\' 2"  (1.575 m)    Weight:   Wt Readings from Last 1 Encounters:  12/02/17 (!) 300 lb 7.8 oz (136.3 kg)    Ideal Body Weight:  52.3 kg  BMI:  Body mass index is 54.96 kg/m.  Estimated Nutritional Needs:   Kcal:  1832-2200 calories (ABW x25-30)  Protein:  115-131 grams  Fluid:  1.8-2.2L  Satira Anis.  Corah Willeford, MS, RD LDN Inpatient Clinical Dietitian Pager [phone]

## 2017-12-02 NOTE — Progress Notes (Addendum)
  Speech Language Pathology Treatment: Dysphagia;Passy Muir Speaking valve  Patient Details Name: [name] MRN: [id number] DOB: 1975-10-14 Today's Date: 12/02/2017 Time:  - 5361-4431    Assessment / Plan / Recommendation Clinical Impression  Patient seen for pmv and dysphagia treat. Alert and upright in chair, eager to eat mouthing "jello" repeatedly. Cuff deflated at baseline and patient tolerating well with stable vital signs. Valve placed and patient with immediate low intensity, breathy, and hoarse phonation but intelligible at the word and short phrase level in 50% of attempts. Moderate SLP cueing provided for pauses in vocalizations and increased effort which was minimally successful to increase strength and quality of phonation. Vital signs remained stable during trials and patient without evidence of air trapping with valve removal at 1, 5, and 10 minutes.   Able to consume diagnostic ice chip trials without overt indication of aspiration although given severity of edema impacting swallowing function, suspect ongoing deficits. Improved tolerance of PMV indicates at least some degree of improved laryngeal edema. Patient eager for pos. Will plan for repeat MBS today although potential to initiate a full po diet guarded.   Valve removed at end of session as patient will need to be observed for longer periods of time and demonstrate ability to don and doff valve before being allowed to utilize valve independently.    HPI HPI: Patient is a 42 y/o female who presents with drug OD and Sz. Intubated 3/13-3/15. resp failure with emergent trach 3/21.  ENT flexible laryngoscopy revealed posterior supraglottic swelling; swollen vocal folds with no abduction; edematous arytenoids; entire subglottis circumferentially swollen with necrotic debris and fibrinous exudate.       SLP Plan  MBS       Recommendations  Diet recommendations: NPO Medication Administration: Via alternative means      Patient may use Passy-Muir Speech Valve: with SLP only MD: Please consider changing trach tube to : Smaller size;Cuffless         Oral Care Recommendations: Oral care QID SLP Visit Diagnosis: Dysphagia, unspecified (R13.10) Plan: MBS       GO             [name], [name] [phone]    [name] 12/02/2017, 2:45 PM

## 2017-12-02 NOTE — Telephone Encounter (Signed)
Patients husband called to cancel appointment for tomorrow, said his wife has been hospitalized for the past couple of weeks and could be for the next few months due to a suicide attempt. He just wanted to let Dr. Lorin Mercy know what is going on.

## 2017-12-02 NOTE — Telephone Encounter (Signed)
fyi

## 2017-12-02 NOTE — Progress Notes (Signed)
CSW following for discharge plan. CSW contacted [facility] to discuss patient's case, and need for inpatient psych with a new trach. Per [place], there are no inpatient psych facilities that are capable of handling a new trach in a psych unit; nurses are not trained to handle trach care to that level on an inpatient psychiatric unit, it's outside of their scope.   CSW to present patient at [facility] tomorrow to discuss. CSW will continue to follow.  Laveda Abbe, [place] Clinical Social Worker [phone]

## 2017-12-02 NOTE — Telephone Encounter (Signed)
Noted  

## 2017-12-02 NOTE — Progress Notes (Signed)
PROGRESS NOTE    [name]  OVF:[id number] DOB: 1975-12-04 DOA: 11/20/2017 PCP: Sandi Mariscal, MD   Brief Narrative:  42 year old morbidly obese female with history of Cushing's disease, diabetes on insulin, depression, hypertension, OSA who was admitted on 3/13 to ICU for intestinal drug overdose.  Patient took extra pills of baclofen, trazodone and tramadol.  Patient became encephalopathic and had generalized seizure in ER therefore patient was intubated and admitted by pulmonary critical care.  Patient was evaluated by psychiatrist who recommended inpatient psych on discharge.  Started on diuretics for pulmonary congestion.  Transferred to [facility] on 11/24/2017. Patient developed stridor and was seen by ENT, found to have irregular inflammatory vocal cord changes. ENT recommended medrol dose pack and PPI.  Patient developed had increasing stridor. Rapid response was consulted, patient given racemic epi. 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 This morning, patient began to have more respiratory distress. Ordered BiPAP. PCCM and anesthesia at bedside for possible intubation. S/p tracheostomy.    Pyschiatry reevaluated patient and still recommending inpatient psychiatric admission.  12/02/2017: Patient seen alongside patient's husband, mother and another relative.  No new complaints.  Inpatient psych placement is been awaited.  Otherwise, no new complaints.  Assessment & Plan   Acute respiratory failure secondary to stridor/Glottic obstruction  -ENT, Dr. Redmond Baseman, consulted and appreciated- s/p transnasal fiberoptic laryngoscopy: posterior vocal folds have irregular inflammatory changes at the vocal processes. -patient was given 2 doses of IV decadron -As above, overnight patient developed increasing stridor.  ENT reevaluated patient with bedside laryngoscopy and found posterior supraglottic  swelling.  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   -s/p tracheostomy  -ENT will plan to follow up with patient as an outpatient in a few weeks to evaluate larynx, healing, and possible decannulation -Suspect patient should be reevaluated by ENT while in the hospital -Currently patient on trach collar and tolerating it well -speech therapy recommended NPO and placement of Cortrak under fluoroscopy  -have placed patient on IV solumedrol -trach aspirate culture pending  Intentional overdose with suicidal ideation -required intubation and is now extubated -psychiatry consulted and appreciated, recommended inpatient psych admission -Currently has safety sitter -hold wellbutrin, neurontin, atarax, trazodone- given NPO status -psych reevaluated patient on 3/22- still recommended inpatient psych admission  Acute toxic encephalopathy -with inability to protect airway/in the setting of intentional overdose requiring intubation -drug screen negative -CT head unremarkable  -Improved, appears to be at baseline  Acute pulmonary edema -Improved, was on IV lasix -CXR on 3/21 reviewed and unremarkable for infection or edema  Shortness of breath and chest pain -SOB resolved with trach placement -no further complaints of chest pain -Chest x-ray, EKG, troponin, d-dimer are unremarkable -Most likely contributed by anxiety  Hypotension -Resolved, patient has a history of hypertension -BP stable, 141/70 -Continue to monitor   Acute kidney injury and hyperkalemia -Resolved -continue to monitor BMP  Hypomagnesemia -resolved with replacement -continue to monitor and replace as needed   Seizure disorder -currently stable and on no medications -monitor closely given NPO status and psych meds cannot be administered  Anxiety/depression -needs inpatient psych admission -medications as above- currently held  Diabetes mellitus, type II with  hyperglycemia -currently on steroids -continue Lantus, ISS, CBG monitoring- changed to q4h  Hypothyroidism -transitioned to IV synthroid as patient is currently NPO  DVT Prophylaxis  lovenox  Code Status: Full  Family Communication: Husband and mother Disposition Plan: Inpatient psychiatry.  Consultants PCCM Psychiatry  ENT  Procedures  Intubation/extubation Renal US transnasal fiberoptic laryngoscopy x 2 Tracheostomy and direct laryngoscopy   Antibiotics   Anti-infectives (From admission, onward)   None      Subjective:   Francyne Worthey seen and examined today.  No new complaints.  No fever or chills, no chest pain, no shortness of breath Objective:   Vitals:   12/02/17 0808 12/02/17 0900 12/02/17 1114 12/02/17 1122  BP: (!) 141/70  103/80 103/80  Pulse: 87 97 (!) 128 (!) 126  Resp: 17 19 17 20   Temp:    98.5 F (36.9 C)  TempSrc:    Oral  SpO2: 98% 98% 100% 100%  Weight:      Height:        Intake/Output Summary (Last 24 hours) at 12/02/2017 1140 Last data filed at 12/02/2017 1100 Gross per 24 hour  Intake -  Output 1050 ml  Net -1050 ml   Filed Weights   12/01/17 0455 12/02/17 0500 12/02/17 0742  Weight: (!) 137.2 kg (302 lb 7.5 oz) (!) 136.9 kg (301 lb 13 oz) (!) 136.3 kg (300 lb 7.8 oz)   Exam  General: Well developed, well nourished, morbidly obese  HEENT: NCAT, mucous membranes moist.   Neck: Trach  Cardiovascular: S1 S2 auscultated, RRR, no murmur.  Respiratory: Clear to auscultation bilaterally with equal chest rise  Abdomen: Soft, obese, nontender, nondistended, + bowel sounds  Extremities: warm dry without cyanosis clubbing or edema  Neuro: AAOx3, nonfocal  Psych: Appropriate mood and affect  Data Reviewed: I have personally reviewed following labs and imaging studies  CBC: Recent Labs  Lab 11/26/17 0520 11/29/17 0409 11/30/17 0640  WBC 12.2* 17.3* 18.5*  HGB 13.5 12.7 13.5  HCT 42.3 39.1 43.5  MCV 91.2 90.1 92.4  PLT  372 460* 026*   Basic Metabolic Panel: Recent Labs  Lab 11/26/17 0520 11/27/17 0220 11/27/17 0756 11/29/17 0409 11/30/17 0640  NA 138 136 138 138 139  K 4.6 4.8 4.7 4.4 4.7  CL 102 100* 101 97* 96*  CO2 23 26 29 29 28   GLUCOSE 317* 338* 350* 266* 292*  BUN 24* 19 18 22* 27*  CREATININE 0.91 0.80 0.79 0.83 0.84  CALCIUM 9.5 9.8 9.9 9.4 9.4  MG 1.6*  --  1.9 1.8 2.1  PHOS  --   --   --  3.5  --    GFR: Estimated Creatinine Clearance: 116.5 mL/min (by C-G formula based on SCr of 0.84 mg/dL). Liver Function Tests: No results for input(s): AST, ALT, ALKPHOS, BILITOT, PROT, ALBUMIN in the last 168 hours. No results for input(s): LIPASE, AMYLASE in the last 168 hours. No results for input(s): AMMONIA in the last 168 hours. Coagulation Profile: No results for input(s): INR, PROTIME in the last 168 hours. Cardiac Enzymes: Recent Labs  Lab 11/25/17 1220  TROPONINI <0.03   BNP (last 3 results) No results for input(s): PROBNP in the last 8760 hours. HbA1C: No results for input(s): HGBA1C in the last 72 hours. CBG: Recent Labs  Lab 12/01/17 1623 12/01/17 2016 12/02/17 0047 12/02/17 0344 12/02/17 0742  GLUCAP 285* 305* 278* 237* 155*   Lipid Profile: No results for input(s): CHOL, HDL, LDLCALC, TRIG, CHOLHDL, LDLDIRECT in the last 72 hours. Thyroid Function Tests: No results for input(s): TSH, T4TOTAL, FREET4, T3FREE, THYROIDAB in the last 72 hours. Anemia Panel: No results for input(s): VITAMINB12, FOLATE, FERRITIN, TIBC, IRON, RETICCTPCT in the last 72 hours. Urine analysis:  Component Value Date/Time   COLORURINE RED (A) 10/31/2016 2130   APPEARANCEUR CLOUDY (A) 10/31/2016 2130   LABSPEC 1.027 10/31/2016 2130   PHURINE 6.0 10/31/2016 2130   GLUCOSEU >=500 (A) 10/31/2016 2130   HGBUR LARGE (A) 10/31/2016 2130   BILIRUBINUR NEGATIVE 10/31/2016 2130   KETONESUR NEGATIVE 10/31/2016 2130   PROTEINUR 100 (A) 10/31/2016 2130   UROBILINOGEN 0.2 04/09/2014 1157    NITRITE NEGATIVE 10/31/2016 2130   LEUKOCYTESUR NEGATIVE 10/31/2016 2130   Sepsis Labs: @LABRCNTIP (procalcitonin:4,lacticidven:4)  ) Recent Results (from the past 240 hour(s))  MRSA PCR Screening     Status: None   Collection Time: 11/28/17 11:17 AM  Result Value Ref Range Status   MRSA by PCR NEGATIVE NEGATIVE Final    Comment:        The GeneXpert MRSA Assay (FDA approved for NASAL specimens only), is one component of a comprehensive MRSA colonization surveillance program. It is not intended to diagnose MRSA infection nor to guide or monitor treatment for MRSA infections. Performed at [facility], [place] [address] [place], [place] [address]   Culture, respiratory (NON-Expectorated)     Status: None   Collection Time: 11/30/17  4:28 AM  Result Value Ref Range Status   Specimen Description TRACHEAL ASPIRATE  Final   Special Requests NONE  Final   Gram Stain   Final    MODERATE WBC PRESENT, PREDOMINANTLY PMN FEW GRAM POSITIVE COCCI    Culture   Final    Consistent with normal respiratory flora. Performed at [facility], [place] [address] [place], [place] [address]    Report Status 12/02/2017 FINAL  Final      Radiology Studies: No results found.   Scheduled Meds: . buPROPion  100 mg Oral BID  . chlorhexidine  15 mL Mouth Rinse BID  . cholestyramine  4 g Oral QODAY  . enoxaparin (LOVENOX) injection  60 mg Subcutaneous Q24H  . gabapentin  300 mg Oral BID  . insulin aspart  0-20 Units Subcutaneous Q4H  . insulin glargine  38 Units Subcutaneous QHS  . ipratropium-albuterol  3 mL Nebulization TID  . levothyroxine  37.5 mcg Intravenous Daily  . lidocaine  4 mL Intradermal Once  . mouth rinse  15 mL Mouth Rinse q12n4p  . methylPREDNISolone (SOLU-MEDROL) injection  40 mg Intravenous Daily  . pantoprazole (PROTONIX) IV  40 mg Intravenous BID  . protein supplement shake  11 oz Oral BID BM  . Racepinephrine HCl  0.5 mL Nebulization Once   Continuous  Infusions:    LOS: 12 days   Time Spent in minutes   30 minutes  Bonnell Public D.O. on 12/02/2017 at 11:40 AM  Between 7am to 7pm - Pager - [phone]  After 7pm go to [email or web address] - password [email or web address]  And look for the night coverage person covering for me after hours

## 2017-12-02 NOTE — Care Management Note (Signed)
Case Management Note  Patient Details  Name: NATALIAH HATLESTAD MRN: [id number] Date of Birth: Mar 15, 1976  Subjective/Objective:                    Action/Plan: The plan is for inpatient psychiatric hospitalization when patient is medically ready. CM following.   Expected Discharge Date:                  [facility]  In-House Referral:     Discharge planning Services  CM Consult  Post Acute Care Choice:    Choice offered to:     DME Arranged:    DME Agency:     HH Arranged:    [place] Agency:     Status of Service:  In process, will continue to follow  If discussed at Long Length of Stay Meetings, dates discussed:    Additional Comments:  Pollie Friar, RN 12/02/2017, 1:06 PM

## 2017-12-02 NOTE — Progress Notes (Signed)
Inpatient Diabetes Program Recommendations  AACE/ADA: New Consensus Statement on Inpatient Glycemic Control (2015)  Target Ranges:  Prepandial:   less than 140 mg/dL      Peak postprandial:   less than 180 mg/dL (1-2 hours)      Critically ill patients:  140 - 180 mg/dL   Lab Results  Component Value Date   GLUCAP 246 (H) 12/02/2017   HGBA1C 7.2 (H) 11/20/2017    Review of Glycemic Control Results for [name], [name] L (MRN [id number]) as of 12/02/2017 13:27  Ref. Range 12/02/2017 07:42 12/02/2017 11:40  Glucose-Capillary Latest Ref Range: 65 - 99 mg/dL 155 (H) 246 (H)    Inpatient Diabetes Program Recommendations:   -Consider restart of Novolog 7 units tid if eats 50% Note postprandial hyperglycemia.  Thank you, Nani Gasser. Taym Twist, RN, MSN, CDE  Diabetes Coordinator Inpatient Glycemic Control Team Team Pager [phone] (8am-5pm) 12/02/2017 1:28 PM

## 2017-12-03 ENCOUNTER — Ambulatory Visit (INDEPENDENT_AMBULATORY_CARE_PROVIDER_SITE_OTHER): Payer: Medicaid Other | Admitting: Orthopaedic Surgery

## 2017-12-03 LAB — GLUCOSE, CAPILLARY
GLUCOSE-CAPILLARY: 159 mg/dL — AB (ref 65–99)
GLUCOSE-CAPILLARY: 179 mg/dL — AB (ref 65–99)
GLUCOSE-CAPILLARY: 223 mg/dL — AB (ref 65–99)
GLUCOSE-CAPILLARY: 254 mg/dL — AB (ref 65–99)
Glucose-Capillary: 126 mg/dL — ABNORMAL HIGH (ref 65–99)
Glucose-Capillary: 187 mg/dL — ABNORMAL HIGH (ref 65–99)
Glucose-Capillary: 218 mg/dL — ABNORMAL HIGH (ref 65–99)

## 2017-12-03 MED ORDER — CLINDAMYCIN PHOSPHATE 300 MG/50ML IV SOLN
300.0000 mg | Freq: Four times a day (QID) | INTRAVENOUS | Status: DC
  Administered 2017-12-03 – 2017-12-09 (×22): 300 mg via INTRAVENOUS
  Filled 2017-12-03 (×26): qty 50

## 2017-12-03 NOTE — Progress Notes (Signed)
Spoke with Dr. Redmond Baseman on the telephone and conveyed Dr. Kyra Searles concerns regarding foul odor from trach and elevated WBC. Dr. Redmond Baseman has no new orders at this time. Relayed this information to Dr. Marthenia Rolling. [name]

## 2017-12-03 NOTE — Progress Notes (Signed)
Physical Therapy Treatment Patient Details Name: [name] MRN: [id number] DOB: August 22, 1976 Today's Date: 12/03/2017    History of Present Illness Patient is a 42 y/o female who presents with drug OD and Sz. Intubated 3/13-3/15. resp failure with emergent trach 3/21 due to vocal cord dysfunction. PMH includes ORIF RLE, depression, anxiety, morbid obesity, DM, Cushing's, Rt uni knee replacement, HTN, left ankle surgery, dyslipidemia, hypothyroidism.     PT Comments    Pt is making good progress with gait making significant distance gains today with RW and chair to follow to encourage safe increase in gait distance.  Pt did not seem as anxious re: breathing today and had some productive coughing while walking.  O2 sats were stable on 35% FiO2 and HR 122 max observed while walking.  Pt was very short with her husband who was talking near constantly in the room during the session.  She was also quite irritable with therapist at times, but given her current situation this is likely to be expected.  She is quite deconditioned from a physical and cardiopulmomary status standpoint and I discussed post acute therapy options with the RN CM before our session.  Inpatient psych is still being recommended by the team.  PT will continue to follow acutely to help pt make gains in mobility and endurance.  I paged attending MD to inquire about pt wanting to go outside (as she states she is "sick of this room").    Follow Up Recommendations  Supervision for mobility/OOB;Other (comment)(post acute therapy warrented, RN CM/SW working on options)     Clinical biochemist with 5" wheels    Recommendations for Other Services OT consult     Precautions / Restrictions Precautions Precautions: Other (comment) Precaution Comments: tachycardia; suicide precautions, trach    Mobility  Bed Mobility               General bed mobility comments: Pt was OOB in the recliner chair.    Transfers Overall transfer level: Needs assistance Equipment used: Rolling walker (2 wheeled) Transfers: Sit to/from Stand Sit to Stand: Min guard         General transfer comment: min guard assist for safety   Ambulation/Gait Ambulation/Gait assistance: Supervision;+2 safety/equipment Ambulation Distance (Feet): 150 Feet Assistive device: Rolling walker (2 wheeled) Gait Pattern/deviations: Step-through pattern;Trunk flexed Gait velocity: decreased Gait velocity interpretation: Below normal speed for age/gender General Gait Details: O2 sats stable on 35% FiO2 (8L O2 via portable tank).  HR 122 max up from 100s at rest.  Pt with productive cough during gait and chair to follow to encourage increased gait distance.         Balance Overall balance assessment: Needs assistance Sitting-balance support: Feet supported;No upper extremity supported Sitting balance-Leahy Scale: Good     Standing balance support: Single extremity supported;Bilateral upper extremity supported;No upper extremity supported Standing balance-Leahy Scale: Fair                              Cognition Arousal/Alertness: Awake/alert Behavior During Therapy: Flat affect Overall Cognitive Status: Within Functional Limits for tasks assessed                                 General Comments: Appears normal, using communication board to help with communication due to lip reading is difficult at times.       Exercises  General Comments General comments (skin integrity, edema, etc.): Pt asking if she can go outside.  MD paged to ask, but not sure with both her respiratory issues and her suicide precautions if they will let her.  I think it would help her mentally.       Pertinent Vitals/Pain Pain Assessment: Faces Faces Pain Scale: Hurts even more Pain Location: with any movement of her trach/collar Pain Descriptors / Indicators: Grimacing;Guarding Pain Intervention(s):  Limited activity within patient's tolerance;Monitored during session;Repositioned           PT Goals (current goals can now be found in the care plan section) Acute Rehab PT Goals Patient Stated Goal: to go home and to go outside.   Progress towards PT goals: Progressing toward goals    Frequency    Min 3X/week      PT Plan Discharge plan needs to be updated       AM-PAC PT "6 Clicks" Daily Activity  Outcome Measure  Difficulty turning over in bed (including adjusting bedclothes, sheets and blankets)?: A Little Difficulty moving from lying on back to sitting on the side of the bed? : Unable Difficulty sitting down on and standing up from a chair with arms (e.g., wheelchair, bedside commode, etc,.)?: Unable Help needed moving to and from a bed to chair (including a wheelchair)?: A Little Help needed walking in hospital room?: A Little Help needed climbing 3-5 steps with a railing? : A Little 6 Click Score: 14    End of Session Equipment Utilized During Treatment: Oxygen Activity Tolerance: Patient limited by fatigue Patient left: in chair;with call bell/phone within reach;with nursing/sitter in room;with family/visitor present   PT Visit Diagnosis: Muscle weakness (generalized) (M62.81);Unsteadiness on feet (R26.81);Difficulty in walking, not elsewhere classified (R26.2)     Time: 4599-7741 PT Time Calculation (min) (ACUTE ONLY): 31 min  Charges:  $Gait Training: 8-22 mins $Therapeutic Activity: 8-22 mins          Reford Olliff B. Mahagony Grieb, [place], DPT [phone]            12/03/2017, 1:59 PM

## 2017-12-03 NOTE — Care Management Note (Signed)
Case Management Note  Patient Details  Name: [name] MRN: [id number] Date of Birth: 01/31/1976  Subjective/Objective:                    Action/Plan: The plan is for [facility] in [place], [place] when patient has an established form of nutrition and when she is off all IV medications. CSW updated. CM following.  Expected Discharge Date:                  [facility]  [facility]  In-House Referral:     Discharge planning Services  CM Consult  Post Acute Care Choice:    Choice offered to:     DME Arranged:    DME Agency:     HH Arranged:    [place] Agency:     Status of Service:  In process, will continue to follow  If discussed at Long Length of Stay Meetings, dates discussed:    Additional Comments:  Pollie Friar, RN 12/03/2017, 2:13 PM

## 2017-12-03 NOTE — Progress Notes (Signed)
PROGRESS NOTE    [name]  DJT:[id number] DOB: 24-Apr-1976 DOA: 11/20/2017 PCP: Sandi Mariscal, MD   Brief Narrative:  42 year old morbidly obese female with history of Cushing's disease, diabetes on insulin, depression, hypertension, OSA who was admitted on 3/13 to ICU for intentional drug overdose.  Patient took extra pills of baclofen, trazodone and tramadol.  Patient became encephalopathic and had generalized seizure in ER therefore patient was intubated and admitted by pulmonary critical care.  Patient was evaluated by psychiatrist who recommended inpatient psych on discharge.  Started on diuretics for pulmonary congestion.  Transferred to [facility] on 11/24/2017. Patient developed stridor and was seen by ENT, found to have irregular inflammatory vocal cord changes. ENT recommended medrol dose pack and PPI.  Patient developed increasing stridor. Rapid response was consulted, patient given racemic epi. 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 This morning, patient began to have more respiratory distress. Ordered BiPAP. PCCM and anesthesia at bedside for possible intubation. S/p tracheostomy.    Pyschiatry reevaluated patient and still recommending inpatient psychiatric admission.  12/03/2017: Worsening odor noted from the patient's tracheostomy.  This is concerning for infection.  Worsening leukocytosis is noted.  This was discussed with ENT surgeon, Dr. Melida Quitter.  Dr. Redmond Baseman has recommended starting patient on IV clindamycin, and ENT will see patient and manage accordingly.  Will hold IV Solu-Medrol as there concerns for possible infection, worsening odor, and worsening leukocytosis.  ENT input is highly appreciated.  Patient is still awaiting transfer to psychiatric facility when medically optimized.   Assessment & Plan   Acute respiratory failure secondary to stridor/Glottic obstruction    -ENT, Dr. Redmond Baseman, consulted and appreciated- s/p transnasal fiberoptic laryngoscopy: posterior vocal folds have irregular inflammatory changes at the vocal processes. -patient was given 2 doses of IV decadron -As above, overnight patient developed increasing stridor.  ENT reevaluated patient with bedside laryngoscopy and found posterior supraglottic swelling.  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   -s/p tracheostomy  -ENT will plan to follow up with patient as an outpatient in a few weeks to evaluate larynx, healing, and possible decannulation -Suspect patient should be reevaluated by ENT while in the hospital -Currently patient on trach collar and tolerating it well -speech therapy recommended NPO and placement of Cortrak under fluoroscopy  -have placed patient on IV solumedrol -trach aspirate culture pending -Worsening odor from the patient's Trach: Discussed with the ENT Team, and IV Clindamycin was advised. ENT will direct care. Hold IV solumedrol until seen by ENT. Worsening leukocytosis noted.  Intentional overdose with suicidal ideation -required intubation and is now extubated -psychiatry consulted and appreciated, recommended inpatient psych admission -Currently has safety sitter -hold wellbutrin, neurontin, atarax, trazodone- given NPO status -psych reevaluated patient on 3/22- still recommended inpatient psych admission  Acute toxic encephalopathy -with inability to protect airway/in the setting of intentional overdose requiring intubation -drug screen negative -CT head unremarkable  -Improved, appears to be at baseline  Acute pulmonary edema -Improved, was on IV lasix -CXR on 3/21 reviewed and unremarkable for infection or edema  Shortness of breath and chest pain -SOB resolved with trach placement -no further complaints of chest pain -Chest x-ray, EKG, troponin, d-dimer are unremarkable -Most likely contributed by  anxiety  Hypotension -Resolved, patient has a history of hypertension -BP stable, 141/70 -Continue to monitor   Acute kidney injury and hyperkalemia -Resolved -continue to monitor BMP  Hypomagnesemia -resolved with replacement -continue to  monitor and replace as needed   Seizure disorder -currently stable and on no medications -monitor closely given NPO status and psych meds cannot be administered  Anxiety/depression -needs inpatient psych admission -medications as above- currently held  Diabetes mellitus, type II with hyperglycemia -currently on steroids -continue Lantus, ISS, CBG monitoring- changed to q4h  Hypothyroidism -transitioned to IV synthroid as patient is currently NPO  DVT Prophylaxis  lovenox  Code Status: Full  Family Communication: Husband and mother Disposition Plan: Inpatient psychiatry.  Consultants PCCM Psychiatry  ENT  Procedures  Intubation/extubation Renal US transnasal fiberoptic laryngoscopy x 2 Tracheostomy and direct laryngoscopy   Antibiotics   Anti-infectives (From admission, onward)   Start     Dose/Rate Route Frequency Ordered Stop   12/03/17 1800  clindamycin (CLEOCIN) IVPB 300 mg     300 mg 100 mL/hr over 30 Minutes Intravenous Every 6 hours 12/03/17 1458        Subjective:   [name] seen and examined today.  Worsening odor from the patient's Tracheostomy tube. Objective:   Vitals:   12/03/17 1332 12/03/17 1517 12/03/17 1542 12/03/17 1635  BP:   113/69 113/69  Pulse: 97 97 82   Resp: (!) 22 20 (!) 23   Temp:    97.9 F (36.6 C)  TempSrc:    Axillary  SpO2: 97% 97% 97%   Weight:      Height:        Intake/Output Summary (Last 24 hours) at 12/03/2017 1732 Last data filed at 12/03/2017 1604 Gross per 24 hour  Intake 20 ml  Output 150 ml  Net -130 ml   Filed Weights   12/02/17 0500 12/02/17 0742 12/03/17 0646  Weight: (!) 136.9 kg (301 lb 13 oz) (!) 136.3 kg (300 lb 7.8 oz) 135.3 kg (298 lb 4.5 oz)    Exam  General: Well developed, well nourished, morbidly obese  HEENT: NCAT, mucous membranes moist.   Neck: Trach  Cardiovascular: S1 S2 auscultated, RRR, no murmur.  Respiratory: Clear to auscultation bilaterally with equal chest rise  Abdomen: Soft, obese, nontender, nondistended, + bowel sounds  Extremities: warm dry without cyanosis clubbing or edema  Neuro: AAOx3, nonfocal  Psych: Appropriate mood and affect  Data Reviewed: I have personally reviewed following labs and imaging studies  CBC: Recent Labs  Lab 11/29/17 0409 11/30/17 0640 12/02/17 1147  WBC 17.3* 18.5* 19.1*  HGB 12.7 13.5 15.1*  HCT 39.1 43.5 46.6*  MCV 90.1 92.4 91.2  PLT 460* 403* 628*   Basic Metabolic Panel: Recent Labs  Lab 11/27/17 0220 11/27/17 0756 11/29/17 0409 11/30/17 0640 12/02/17 1147  NA 136 138 138 139 139  K 4.8 4.7 4.4 4.7 4.0  CL 100* 101 97* 96* 96*  CO2 26 29 29 28 27   GLUCOSE 338* 350* 266* 292* 243*  BUN 19 18 22* 27* 35*  CREATININE 0.80 0.79 0.83 0.84 1.02*  CALCIUM 9.8 9.9 9.4 9.4 9.6  MG  --  1.9 1.8 2.1 2.1  PHOS  --   --  3.5  --   --    GFR: Estimated Creatinine Clearance: 95.5 mL/min (A) (by C-G formula based on SCr of 1.02 mg/dL (H)). Liver Function Tests: Recent Labs  Lab 12/02/17 1147  AST 19  ALT 18  ALKPHOS 104  BILITOT 0.8  PROT 7.4  ALBUMIN 2.7*   No results for input(s): LIPASE, AMYLASE in the last 168 hours. No results for input(s): AMMONIA in the last 168 hours. Coagulation Profile: No  results for input(s): INR, PROTIME in the last 168 hours. Cardiac Enzymes: No results for input(s): CKTOTAL, CKMB, CKMBINDEX, TROPONINI in the last 168 hours. BNP (last 3 results) No results for input(s): PROBNP in the last 8760 hours. HbA1C: No results for input(s): HGBA1C in the last 72 hours. CBG: Recent Labs  Lab 12/03/17 0012 12/03/17 0413 12/03/17 0744 12/03/17 1143 12/03/17 1618  GLUCAP 254* 179* 126* 159* 223*   Lipid Profile: No  results for input(s): CHOL, HDL, LDLCALC, TRIG, CHOLHDL, LDLDIRECT in the last 72 hours. Thyroid Function Tests: No results for input(s): TSH, T4TOTAL, FREET4, T3FREE, THYROIDAB in the last 72 hours. Anemia Panel: No results for input(s): VITAMINB12, FOLATE, FERRITIN, TIBC, IRON, RETICCTPCT in the last 72 hours. Urine analysis:    Component Value Date/Time   COLORURINE RED (A) 10/31/2016 2130   APPEARANCEUR CLOUDY (A) 10/31/2016 2130   LABSPEC 1.027 10/31/2016 2130   PHURINE 6.0 10/31/2016 2130   GLUCOSEU >=500 (A) 10/31/2016 2130   HGBUR LARGE (A) 10/31/2016 2130   BILIRUBINUR NEGATIVE 10/31/2016 2130   KETONESUR NEGATIVE 10/31/2016 2130   PROTEINUR 100 (A) 10/31/2016 2130   UROBILINOGEN 0.2 04/09/2014 1157   NITRITE NEGATIVE 10/31/2016 2130   LEUKOCYTESUR NEGATIVE 10/31/2016 2130   Sepsis Labs: @LABRCNTIP (procalcitonin:4,lacticidven:4)  ) Recent Results (from the past 240 hour(s))  MRSA PCR Screening     Status: None   Collection Time: 11/28/17 11:17 AM  Result Value Ref Range Status   MRSA by PCR NEGATIVE NEGATIVE Final    Comment:        The GeneXpert MRSA Assay (FDA approved for NASAL specimens only), is one component of a comprehensive MRSA colonization surveillance program. It is not intended to diagnose MRSA infection nor to guide or monitor treatment for MRSA infections. Performed at [facility], [place] [address], [place], [place] [address]   Culture, respiratory (NON-Expectorated)     Status: None   Collection Time: 11/30/17  4:28 AM  Result Value Ref Range Status   Specimen Description TRACHEAL ASPIRATE  Final   Special Requests NONE  Final   Gram Stain   Final    MODERATE WBC PRESENT, PREDOMINANTLY PMN FEW GRAM POSITIVE COCCI    Culture   Final    Consistent with normal respiratory flora. Performed at [facility], [place] [address] [place], [place] [address]    Report Status 12/02/2017 FINAL  Final      Radiology Studies: No results  found.   Scheduled Meds: . buPROPion  100 mg Oral BID  . chlorhexidine  15 mL Mouth Rinse BID  . cholestyramine  4 g Oral QODAY  . enoxaparin (LOVENOX) injection  60 mg Subcutaneous Q24H  . gabapentin  300 mg Oral BID  . insulin aspart  0-20 Units Subcutaneous Q4H  . insulin glargine  38 Units Subcutaneous QHS  . ipratropium-albuterol  3 mL Nebulization TID  . levothyroxine  37.5 mcg Intravenous Daily  . lidocaine  4 mL Intradermal Once  . mouth rinse  15 mL Mouth Rinse q12n4p  . methylPREDNISolone (SOLU-MEDROL) injection  40 mg Intravenous Daily  . pantoprazole (PROTONIX) IV  40 mg Intravenous BID  . protein supplement shake  11 oz Oral BID BM  . Racepinephrine HCl  0.5 mL Nebulization Once   Continuous Infusions: . clindamycin (CLEOCIN) IV       LOS: 13 days   Time Spent in minutes   30 minutes  Bonnell Public D.O. on 12/03/2017 at 5:32 PM  Between 7am to 7pm - Pager - [phone].  After 7pm go to [email or web address] - password [email or web address]  And look for the night coverage person covering for me after hours

## 2017-12-03 NOTE — Progress Notes (Signed)
Pt c/o of no BM for about a week, NP Schorr paged at 2200, ordered dulcolax suppository, same given at 2231, pt later had a small BM at 2359, pt said she normally have intermittent diarrhea for the past 12 years, will however continue to monitor. Obasogie-Asidi, [name]

## 2017-12-04 LAB — GLUCOSE, CAPILLARY
GLUCOSE-CAPILLARY: 157 mg/dL — AB (ref 65–99)
GLUCOSE-CAPILLARY: 139 mg/dL — AB (ref 65–99)
GLUCOSE-CAPILLARY: 207 mg/dL — AB (ref 65–99)
Glucose-Capillary: 249 mg/dL — ABNORMAL HIGH (ref 65–99)
Glucose-Capillary: 198 mg/dL — ABNORMAL HIGH (ref 65–99)

## 2017-12-04 LAB — CBC WITH DIFFERENTIAL/PLATELET
BASOS PCT: 0 %
Basophils Absolute: 0 10*3/uL (ref 0.0–0.1)
Eosinophils Absolute: 0 10*3/uL (ref 0.0–0.7)
Eosinophils Relative: 0 %
HEMATOCRIT: 46.2 % — AB (ref 36.0–46.0)
HEMOGLOBIN: 15.2 g/dL — AB (ref 12.0–15.0)
Lymphocytes Relative: 13 %
Lymphs Abs: 2.2 10*3/uL (ref 0.7–4.0)
MCH: 29.5 pg (ref 26.0–34.0)
MCHC: 32.9 g/dL (ref 30.0–36.0)
MCV: 89.7 fL (ref 78.0–100.0)
MONO ABS: 1.6 10*3/uL — AB (ref 0.1–1.0)
Monocytes Relative: 9 %
NEUTROS ABS: 12.9 10*3/uL — AB (ref 1.7–7.7)
NEUTROS PCT: 78 %
PLATELETS: 491 10*3/uL — AB (ref 150–400)
RBC: 5.15 MIL/uL — ABNORMAL HIGH (ref 3.87–5.11)
RDW: 13 % (ref 11.5–15.5)
WBC: 16.7 10*3/uL — ABNORMAL HIGH (ref 4.0–10.5)

## 2017-12-04 LAB — BASIC METABOLIC PANEL
ANION GAP: 16 — AB (ref 5–15)
BUN: 37 mg/dL — ABNORMAL HIGH (ref 6–20)
CALCIUM: 9.7 mg/dL (ref 8.9–10.3)
CO2: 25 mmol/L (ref 22–32)
Chloride: 96 mmol/L — ABNORMAL LOW (ref 101–111)
Creatinine, Ser: 0.92 mg/dL (ref 0.44–1.00)
Glucose, Bld: 185 mg/dL — ABNORMAL HIGH (ref 65–99)
Potassium: 3.9 mmol/L (ref 3.5–5.1)
Sodium: 137 mmol/L (ref 135–145)

## 2017-12-04 MED ORDER — LIDOCAINE HCL 2 % EX GEL
1.0000 "application " | Freq: Once | CUTANEOUS | Status: DC | PRN
  Filled 2017-12-04: qty 5

## 2017-12-04 MED ORDER — LIDOCAINE HCL 4 % EX SOLN
0.0000 mL | Freq: Once | CUTANEOUS | Status: DC | PRN
  Filled 2017-12-04: qty 50

## 2017-12-04 MED ORDER — LIDOCAINE-EPINEPHRINE (PF) 1 %-1:200000 IJ SOLN
0.0000 mL | Freq: Once | INTRAMUSCULAR | Status: DC | PRN
  Filled 2017-12-04: qty 30

## 2017-12-04 MED ORDER — TRIPLE ANTIBIOTIC 3.5-400-5000 EX OINT
1.0000 "application " | TOPICAL_OINTMENT | Freq: Once | CUTANEOUS | Status: DC | PRN
  Filled 2017-12-04: qty 1

## 2017-12-04 MED ORDER — OXYMETAZOLINE HCL 0.05 % NA SOLN
1.0000 | Freq: Once | NASAL | Status: DC | PRN
  Filled 2017-12-04: qty 15

## 2017-12-04 MED ORDER — METOPROLOL TARTRATE 5 MG/5ML IV SOLN: 5.0000 mg | Freq: Four times a day (QID) | INTRAVENOUS | Status: DC | PRN

## 2017-12-04 MED ORDER — SILVER NITRATE-POT NITRATE 75-25 % EX MISC
1.0000 | Freq: Once | CUTANEOUS | Status: DC | PRN
  Filled 2017-12-04: qty 1

## 2017-12-04 MED ORDER — IPRATROPIUM-ALBUTEROL 0.5-2.5 (3) MG/3ML IN SOLN
3.0000 mL | RESPIRATORY_TRACT | Status: DC | PRN
  Administered 2017-12-06 – 2017-12-21 (×8): 3 mL via RESPIRATORY_TRACT
  Filled 2017-12-04 (×8): qty 3

## 2017-12-04 NOTE — Progress Notes (Signed)
PROGRESS NOTE    [name]  WEX:[id number] DOB: 1976-06-04 DOA: 11/20/2017 PCP: Sandi Mariscal, MD   Brief Narrative:  41-year- Body mass index is 53.83 kg/m.Cushing's disease, diabetes on insulin, depression, hypertension in a setting pfo priopor pre-eclmapsia when previously pregnant, OSA ? On cvpap-prior TIb plateau # 07/2015, R side knee arthroplasty Admitted on 3/13 to ICU for intentional drug overdose of baclofen, trazodone and tramadol.   Was encephalopathic and had generalized seizure in ER therefore patient was intubated and admitted by pulmonary critical care.  Patient was evaluated by psychiatrist who recommended inpatient psych on discharge.  Started on diuretics for pulmonary congestion.  Transferred to [facility] on 11/24/2017.  Patient developed stridor and was seen by ENT, found to have irregular inflammatory vocal cord changes. ENT recommended medrol dose pack and PPI.  Patient developed increasing stridor. Rapid response was consulted, patient given racemic epi.   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 S/p tracheostomy.    Pyschiatry reevaluated patient and still recommending inpatient psychiatric admission.   Subjective on 12/04/17     Assessment & Plan   Acute respiratory failure secondary to stridor/Glottic obstruction  -ENT, Dr. Redmond Baseman, consulted and appreciated- s/p transnasal fiberoptic laryngoscopy: posterior vocal folds have irregular inflammatory changes at the vocal processes. -patient was given 2 doses of IV decadron -As above, developed increasing stridor.  ENT reevaluated patient with bedside laryngoscopy 11/26/17 found posterior supraglottic swelling.  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   -s/p tracheostomy 11/28/17 -ENT will plan to follow up with patient as an outpatient in a few weeks -speech therapy recommended NPO and  placement of Cortrak under fluoroscopy  -Worsening odor from [place] 3/26 : Discussed with the ENT Team, and IV Clindamycin was advised. ENT will direct care. Hold IV solumedrol until seen by ENT. Worsening leukocytosis noted.  Have called and discussed with Dr. Constance Holster 3/27 who will be evaluating the patient shortly and appreciate input regarding nutrition and diet, Passy-Muir trials, need for steroids and whether further procedures are needed-I have attempted to speak with her husband about her care but have not reached him today  Intentional overdose with suicidal ideation -required intubation and is now extubated -psychiatry consulted and appreciated, recommended inpatient psych admission -Currently has safety sitter -psych reevaluated patient on 3/22- still recommended inpatient psych admission  Inability to take meds -meds have not yet been resumed given n.p.o. defer to ENT as well as speech therapy regarding the same -Have asked nursing to correct [facility] as meds have been documented as being given  Acute toxic encephalopathy -with inability to protect airway/in the setting of intentional overdose requiring intubation -drug screen negative -CT head unremarkable  -Improved, appears to be at baseline  Acute pulmonary edema -Improved, was on IV lasix -CXR on 3/21 reviewed and unremarkable for infection or edema  Shortness of breath and chest pain suspect anxiety component -SOB resolved with trach placement -no further complaints of chest pain -Chest x-ray, EKG, troponin, d-dimer are unremarkable  Hypotension, sinus tachycardia -Resolved, patient has a history of hypertension -Will give IV metoprolo 5 mg every 6 for heart rate sustained above 120   Acute kidney injury and hyperkalemia -BUN/creatinine on admission 19/0.8 currently 35/1.0 -continue to monitor BMP-currently not on diuretics, will need IV fluid if worsens  Hypomagnesemia -resolved with replacement and last check was  2.1 -follow labs from this a.m.   Seizure disorder -currently stable and on no  medications -monitor closely given NPO status and psych meds cannot be administered  Anxiety/depression -needs inpatient psych admission -medications as above- currently held  Diabetes mellitus, type II with hyperglycemia -currently on steroids -continue Lantus 38 units at bedtime home dose 28 7030 twice daily -Holding metformin 1000 twice daily, Actos 15 daily  CBG monitoring- changed to q4h-CBGs ranging from 139-187  Hypothyroidism -transitioned to IV synthroid as patient is currently NPO  DVT Prophylaxis  lovenox Code Status: Full Family Communication: Called and discussed with husband on the phone Disposition Plan: Inpatient psychiatry.  Consultants PCCM Psychiatry  ENT  Procedures  Intubation/extubation Renal US transnasal fiberoptic laryngoscopy x 2 Tracheostomy and direct laryngoscopy   Antibiotics   Anti-infectives (From admission, onward)   Start     Dose/Rate Route Frequency Ordered Stop   12/03/17 1800  clindamycin (CLEOCIN) IVPB 300 mg     300 mg 100 mL/hr over 30 Minutes Intravenous Every 6 hours 12/03/17 1458        Subjective:   Patient is doing fair but has a headache from the front to the back of the head no blurred vision no double vision No chest pain but does have some back pain which I believe is chronic Speech therapy as well as respiratory therapy indicate she has had some further bleeding from the tracheotomy site and it only looks like some oozing to me externally-- she is not short of breath nor dyspneic Patient does not seem to be in distress  Objective:   Vitals:   12/04/17 0315 12/04/17 0411 12/04/17 0759 12/04/17 0809  BP:  118/73 113/71   Pulse: 78 87 96 (!) 104  Resp: (!) 21 18 17 16   Temp:  97.8 F (36.6 C) 97.9 F (36.6 C)   TempSrc:  Oral Oral   SpO2: (!) 89% 100% 99% 100%  Weight:  133.5 kg (294 lb 5 oz)    Height:        Intake/Output  Summary (Last 24 hours) at 12/04/2017 0902 Last data filed at 12/04/2017 0411 Gross per 24 hour  Intake 110 ml  Output -  Net 110 ml   Filed Weights   12/02/17 0742 12/03/17 0646 12/04/17 0411  Weight: (!) 136.3 kg (300 lb 7.8 oz) 135.3 kg (298 lb 4.5 oz) 133.5 kg (294 lb 5 oz)   Exam  General: Well developed, well nourished, morbidly obese  HEENT: NCAT, mucous membranes moist.   Neck: Trach  Cardiovascular: S1 S2 auscultated, RRR, no murmur.  Respiratory: Clear to auscultation bilaterally with equal chest rise  Abdomen: Soft, obese, nontender, nondistended, + bowel sounds  Extremities: warm dry without cyanosis clubbing or edema  Neuro: AAOx3, nonfocal  Psych: Appropriate mood and affect  Data Reviewed: I have personally reviewed following labs and imaging studies  CBC: Recent Labs  Lab 11/29/17 0409 11/30/17 0640 12/02/17 1147  WBC 17.3* 18.5* 19.1*  HGB 12.7 13.5 15.1*  HCT 39.1 43.5 46.6*  MCV 90.1 92.4 91.2  PLT 460* 403* 710*   Basic Metabolic Panel: Recent Labs  Lab 11/29/17 0409 11/30/17 0640 12/02/17 1147  NA 138 139 139  K 4.4 4.7 4.0  CL 97* 96* 96*  CO2 29 28 27   GLUCOSE 266* 292* 243*  BUN 22* 27* 35*  CREATININE 0.83 0.84 1.02*  CALCIUM 9.4 9.4 9.6  MG 1.8 2.1 2.1  PHOS 3.5  --   --    GFR: Estimated Creatinine Clearance: 94.7 mL/min (A) (by C-G formula based on SCr of 1.02  mg/dL (H)). Liver Function Tests: Recent Labs  Lab 12/02/17 1147  AST 19  ALT 18  ALKPHOS 104  BILITOT 0.8  PROT 7.4  ALBUMIN 2.7*   No results for input(s): LIPASE, AMYLASE in the last 168 hours. No results for input(s): AMMONIA in the last 168 hours. Coagulation Profile: No results for input(s): INR, PROTIME in the last 168 hours. Cardiac Enzymes: No results for input(s): CKTOTAL, CKMB, CKMBINDEX, TROPONINI in the last 168 hours. BNP (last 3 results) No results for input(s): PROBNP in the last 8760 hours. HbA1C: No results for input(s): HGBA1C in the  last 72 hours. CBG: Recent Labs  Lab 12/03/17 1618 12/03/17 1951 12/03/17 2338 12/04/17 0404 12/04/17 0731  GLUCAP 223* 218* 187* 157* 139*   Lipid Profile: No results for input(s): CHOL, HDL, LDLCALC, TRIG, CHOLHDL, LDLDIRECT in the last 72 hours. Thyroid Function Tests: No results for input(s): TSH, T4TOTAL, FREET4, T3FREE, THYROIDAB in the last 72 hours. Anemia Panel: No results for input(s): VITAMINB12, FOLATE, FERRITIN, TIBC, IRON, RETICCTPCT in the last 72 hours. Urine analysis:    Component Value Date/Time   COLORURINE RED (A) 10/31/2016 2130   APPEARANCEUR CLOUDY (A) 10/31/2016 2130   LABSPEC 1.027 10/31/2016 2130   PHURINE 6.0 10/31/2016 2130   GLUCOSEU >=500 (A) 10/31/2016 2130   HGBUR LARGE (A) 10/31/2016 2130   BILIRUBINUR NEGATIVE 10/31/2016 2130   KETONESUR NEGATIVE 10/31/2016 2130   PROTEINUR 100 (A) 10/31/2016 2130   UROBILINOGEN 0.2 04/09/2014 1157   NITRITE NEGATIVE 10/31/2016 2130   LEUKOCYTESUR NEGATIVE 10/31/2016 2130   Sepsis Labs: @LABRCNTIP (procalcitonin:4,lacticidven:4)  ) Recent Results (from the past 240 hour(s))  MRSA PCR Screening     Status: None   Collection Time: 11/28/17 11:17 AM  Result Value Ref Range Status   MRSA by PCR NEGATIVE NEGATIVE Final    Comment:        The GeneXpert MRSA Assay (FDA approved for NASAL specimens only), is one component of a comprehensive MRSA colonization surveillance program. It is not intended to diagnose MRSA infection nor to guide or monitor treatment for MRSA infections. Performed at [facility], [place] [address] [place], [place] [address]   Culture, respiratory (NON-Expectorated)     Status: None   Collection Time: 11/30/17  4:28 AM  Result Value Ref Range Status   Specimen Description TRACHEAL ASPIRATE  Final   Special Requests NONE  Final   Gram Stain   Final    MODERATE WBC PRESENT, PREDOMINANTLY PMN FEW GRAM POSITIVE COCCI    Culture   Final    Consistent with normal  respiratory flora. Performed at [facility], [place] [address], [place], [place] [address]    Report Status 12/02/2017 FINAL  Final      Radiology Studies: No results found.   Scheduled Meds: . buPROPion  100 mg Oral BID  . chlorhexidine  15 mL Mouth Rinse BID  . cholestyramine  4 g Oral QODAY  . enoxaparin (LOVENOX) injection  60 mg Subcutaneous Q24H  . gabapentin  300 mg Oral BID  . insulin aspart  0-20 Units Subcutaneous Q4H  . insulin glargine  38 Units Subcutaneous QHS  . ipratropium-albuterol  3 mL Nebulization TID  . levothyroxine  37.5 mcg Intravenous Daily  . lidocaine  4 mL Intradermal Once  . mouth rinse  15 mL Mouth Rinse q12n4p  . pantoprazole (PROTONIX) IV  40 mg Intravenous BID  . protein supplement shake  11 oz Oral BID BM  .  Racepinephrine HCl  0.5 mL Nebulization Once   Continuous Infusions: . clindamycin (CLEOCIN) IV Stopped (12/04/17 0806)     LOS: 14 days   Time Spent in minutes   30 minutes  Verneita Griffes, MD [facility] [phone]

## 2017-12-04 NOTE — Plan of Care (Signed)
progressing 

## 2017-12-04 NOTE — Evaluation (Signed)
Occupational Therapy Evaluation Patient Details Name: [name] MRN: [id number] DOB: 1976/07/06 Today's Date: 12/04/2017    History of Present Illness Patient is a 42 y/o female who presents with drug OD and Sz. Intubated 3/13-3/15. resp failure with emergent trach 3/21 due to vocal cord dysfunction. PMH includes ORIF RLE, depression, anxiety, morbid obesity, DM, Cushing's, Rt uni knee replacement, HTN, left ankle surgery, dyslipidemia, hypothyroidism.    Clinical Impression   Pt admitted with above. She demonstrates the below listed deficits and will benefit from continued OT to maximize safety and independence with BADLs.  Pt presents to OT with generalized weakness, decreased activity tolerance, mild balance deficits. Pt currently requires min A - max A for ADLs and min guard assist for functional transfers.  She fatigues quite quickly, and eval limited by immediate increase of HR to 133.  02 sats 94% on 28% FI02.   PTA, she lived at home with spouse and was caregiver to her children.   Plan is for her to discharge to behavioral health - feel she will benefit from follow up OT at facility   Will follow acutely.       Follow Up Recommendations  Other (comment)(follow up OT at behavioral psych )    Equipment Recommendations  None recommended by OT    Recommendations for Other Services       Precautions / Restrictions Precautions Precautions: Fall Precaution Comments: tachycardia; suicide precautions, trach Restrictions Weight Bearing Restrictions: No      Mobility Bed Mobility               General bed mobility comments: Pt was OOB in the recliner chair.   Transfers Overall transfer level: Needs assistance Equipment used: Rolling walker (2 wheeled) Transfers: Sit to/from Stand Sit to Stand: Min guard         General transfer comment: min guard assist for safety     Balance Overall balance assessment: Needs assistance Sitting-balance support: Feet  supported;No upper extremity supported Sitting balance-Leahy Scale: Good Sitting balance - Comments: worked on reaching off BOS - no LOB noted    Standing balance support: Single extremity supported Standing balance-Leahy Scale: Fair Standing balance comment: requires UE support.  Standing limited by elevated HR                            ADL either performed or assessed with clinical judgement   ADL Overall ADL's : Needs assistance/impaired Eating/Feeding: NPO   Grooming: Wash/dry hands;Wash/dry face;Brushing hair;Set up;Sitting   Upper Body Bathing: Moderate assistance;Sitting   Lower Body Bathing: Maximal assistance;Sit to/from stand   Upper Body Dressing : Moderate assistance;Sitting   Lower Body Dressing: Maximal assistance;Sit to/from stand   Toilet Transfer: Min guard;Stand-pivot;BSC;RW   Toileting- Water quality scientist and Hygiene: Total assistance;Sit to/from stand       Functional mobility during ADLs: Min guard;Rolling walker General ADL Comments: limited by body habitus and HR immediately increased to 133 with minimal activity.        Vision Baseline Vision/History: No visual deficits Patient Visual Report: No change from baseline       Perception     Praxis      Pertinent Vitals/Pain Pain Assessment: Faces Faces Pain Scale: Hurts little more Pain Location: with any movement of her trach/collar Pain Descriptors / Indicators: Grimacing Pain Intervention(s): Monitored during session     Hand Dominance Right   Extremity/Trunk Assessment Upper Extremity Assessment Upper Extremity Assessment: Generalized  weakness   Lower Extremity Assessment Lower Extremity Assessment: Defer to PT evaluation   Cervical / Trunk Assessment Cervical / Trunk Assessment: Normal   Communication Communication Communication: Tracheostomy   Cognition Arousal/Alertness: Awake/alert Behavior During Therapy: Flat affect Overall Cognitive Status: Within  Functional Limits for tasks assessed                                 General Comments: appears [facility] for basic ADLs    General Comments  Pt very anxious to move for fear of injurying trach site     Exercises Exercises: General Upper Extremity;General Lower Extremity General Exercises - Upper Extremity Shoulder Flexion: AROM;Right;Left;10 reps;Seated Elbow Flexion: AROM;Right;Left;10 reps;Seated Elbow Extension: AROM;Right;Left;10 reps;Seated General Exercises - Lower Extremity Long Arc Quad: AROM;Right;Left;10 reps;Seated Hip Flexion/Marching: AROM;10 reps;Seated;Both   Shoulder Instructions      Home Living Family/patient expects to be discharged to:: Other (Comment) Living Arrangements: Spouse/significant other;Children Available Help at Discharge: Family;Available PRN/intermittently Type of Home: House Home Access: Stairs to enter [employer] of Steps: 2 Entrance Stairs-Rails: None Home Layout: One level     Bathroom Shower/Tub: Tub/shower unit;Walk-in shower   Bathroom Toilet: Standard Bathroom Accessibility: Yes How Accessible: Accessible via walker Home Equipment: [place]hler - 2 wheels;Bedside commode;Shower seat;Wheelchair - Scientist, physiological: Sock aid        Prior Functioning/Environment Level of Independence: Independent;Independent with assistive device(s)        Comments: uses sock aid for socks.  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 Drives.         OT Problem List: Decreased strength;Decreased activity tolerance;Impaired balance (sitting and/or standing);Decreased safety awareness;Decreased knowledge of use of DME or AE;Cardiopulmonary status limiting activity;Obesity      OT Treatment/Interventions: Self-care/ADL training;Therapeutic exercise;Energy conservation;DME and/or AE instruction;Therapeutic activities;Balance training;Patient/family education    OT Goals(Current goals can be found in the  care plan section) Acute Rehab OT Goals Patient Stated Goal: to get stronger  OT Goal Formulation: With patient Time For Goal Achievement: 12/18/17 Potential to Achieve Goals: Good ADL Goals Pt Will Perform Grooming: with supervision;standing Pt Will Perform Upper Body Bathing: with set-up;with supervision;sitting Pt Will Perform Lower Body Bathing: with min guard assist;with adaptive equipment;sit to/from stand Pt Will Transfer to Toilet: with supervision;ambulating;regular height toilet;grab bars Pt Will Perform Toileting - Clothing Manipulation and hygiene: with min guard assist;sit to/from stand Pt/caregiver will Perform Home Exercise Program: Increased strength;Both right and left upper extremity;With theraband;With written HEP provided;With Supervision  OT Frequency: Min 3X/week   Barriers to D/C:            Co-evaluation              AM-PAC PT "6 Clicks" Daily Activity     Outcome Measure Help from another person eating meals?: Total Help from another person taking care of personal grooming?: A Little Help from another person toileting, which includes using toliet, bedpan, or urinal?: A Lot Help from another person bathing (including washing, rinsing, drying)?: A Lot Help from another person to put on and taking off regular upper body clothing?: A Lot Help from another person to put on and taking off regular lower body clothing?: Total 6 Click Score: 11   End of Session Equipment Utilized During Treatment: Oxygen;Rolling walker Nurse Communication: Mobility status;Other (comment)(elevated HR with activity )  Activity Tolerance: Patient limited by fatigue Patient left: in chair;with call bell/phone within reach;with nursing/sitter in room  OT Visit Diagnosis: Muscle weakness (generalized) (M62.81)                Time: 1219-7588 OT Time Calculation (min): 18 min Charges:  OT Evaluation $OT Eval Moderate Complexity: 1 Mod G-Codes:     [facility],  OTR/L [phone]   Lucille Passy M 12/04/2017, 4:45 PM

## 2017-12-04 NOTE — Progress Notes (Signed)
Patient ID: [name], female   DOB: 11-May-1976, 42 y.o.   MRN: [id number] Asked to evaluate the tracheostomy.  She was having some odor from it yesterday and clindamycin was started.  She was having some bleeding around it today but that has stopped.  On exam, she is sitting up in the chair and breathing comfortably.  She is able to speak with the Passy-Muir valve in place but she does not like to keep it in all the time.  When I obstruct the tracheostomy with my finger she has a hard time breathing.  There is no active bleeding.  There is some dried blood around the skin.  There is no obvious infection.  Stay sutures are still in place.  Recommend we keep the trach site clean as much as possible.  No further action needed now.  Call back if there is any additional problems with bleeding.

## 2017-12-04 NOTE — Progress Notes (Addendum)
Nutrition Follow-up  DOCUMENTATION CODES:   Morbid obesity  INTERVENTION:  Paged MD about potential for tubefeeding If tube placed, recommend Jev 1.2 begin at 69mL/hr, increase by 10 every 8 hours to goal rate of 97mL/hr Pro-stat 72mL BID  At goal, provides 2072 calories, 117 grams of protein, 1244ml free water  NUTRITION DIAGNOSIS:   Increased nutrient needs related to acute illness as evidenced by estimated needs. -ongoing  GOAL:   Patient will meet greater than or equal to 90% of their needs -unmet  MONITOR:   PO intake, I & O's, Weight trends, Labs, Supplement acceptance  ASSESSMENT:   42 yo female admitted with acute toxic encephalopathy secondary to intentional OD on baclofan, trazadone and  tramadol, seizures. Pt intubate for airway protection. Pt with hx of Cushing's, DM, OSA  3/21 - Underwent laryngoscopy and trached 3/22 - Still suffering from pharyngeal dysphagia due to edmea, unable to use PMSV. 3/23 - Cortrak unable to be placed due to nare sensitivity 3/25 - MBSS completed - patient recommended to continue to be NPO 3/27 - SLP evaluated again, recommended alternative means of nutrition, RD paged MD about cortrak placement and tubefeeding x2, he is deferring to speech pathology and ENT.  Will continue to monitor for needs.  Labs reviewed:  CBGs 249, 207, 139 Medications reviewed and include:  Questran  Diet Order:  Diet NPO time specified  EDUCATION NEEDS:   Not appropriate for education at this time  Skin:  Skin Assessment: Skin Integrity Issues: Skin Integrity Issues:: Other (Comment) Other: MASD: abdomen, bilateral groin, under breasts  Last BM:  12/03/2017  Height:   Ht Readings from Last 1 Encounters:  11/28/17 5\' 2"  (1.575 m)    Weight:   Wt Readings from Last 1 Encounters:  12/04/17 294 lb 5 oz (133.5 kg)    Ideal Body Weight:  52.3 kg  BMI:  Body mass index is 53.83 kg/m.  Estimated Nutritional Needs:   Kcal:  1832-2200  calories (ABW x25-30)  Protein:  115-131 grams  Fluid:  1.8-2.2L  Satira Anis. Blannie Shedlock, MS, RD LDN Inpatient Clinical Dietitian Pager [phone]

## 2017-12-04 NOTE — Progress Notes (Signed)
Pt's trach observed to be bleeding, pt coughed out bloody mucus all around the trach tubing, pt cleaned up and trach care done, respiratory staff called to examine the trach, said to expect some type of bleeding since it is a new trach, pt oxygen saturation still 95-100%, denies any discomfort,  pt reassured, will continue to monitor. Obasogie-Asidi, Zylah Elsbernd Efe

## 2017-12-04 NOTE — Progress Notes (Signed)
PROGRESS NOTE    [name]  EPP:[id number] DOB: Oct 31, 1975 DOA: 11/20/2017 PCP: Sandi Mariscal, MD   Brief Narrative:  41-year- Body mass index is 53.83 kg/m.Cushing's disease, diabetes on insulin, depression, hypertension, OSA who was admitted on 3/13 to ICU for intentional drug overdose  of baclofen, trazodone and tramadol.   Patient became encephalopathic and had generalized seizure in ER therefore patient was intubated and admitted by pulmonary critical care.  Patient was evaluated by psychiatrist who recommended inpatient psych on discharge.  Started on diuretics for pulmonary congestion.  Transferred to [facility] on 11/24/2017.  Patient developed stridor and was seen by ENT, found to have irregular inflammatory vocal cord changes. ENT recommended medrol dose pack and PPI.  Patient developed increasing stridor. Rapid response was consulted, patient given racemic epi. 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 This morning, patient began to have more respiratory distress. Ordered BiPAP. PCCM and anesthesia at bedside for possible intubation. S/p tracheostomy.    Pyschiatry reevaluated patient and still recommending inpatient psychiatric admission.   Subjective on 12/04/17    Assessment & Plan   Acute respiratory failure secondary to stridor/Glottic obstruction  -ENT, Dr. Redmond Baseman, consulted and appreciated- s/p transnasal fiberoptic laryngoscopy: posterior vocal folds have irregular inflammatory changes at the vocal processes. -patient was given 2 doses of IV decadron -As above, overnight patient developed increasing stridor.  ENT reevaluated patient with bedside laryngoscopy 11/26/17 found posterior supraglottic swelling.  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   -s/p tracheostomy 11/28/17 -ENT will plan to follow up with patient as an outpatient in a few  weeks -speech therapy recommended NPO and placement of Cortrak under fluoroscopy  -Worsening odor from the patient's Trach 3/27: Discussed with the ENT Team, and IV Clindamycin was advised. ENT will direct care. Hold IV solumedrol until seen by ENT. Worsening leukocytosis noted.  Intentional overdose with suicidal ideation -required intubation and is now extubated -psychiatry consulted and appreciated, recommended inpatient psych admission -Currently has safety sitter -hold wellbutrin, neurontin, atarax, trazodone- given NPO status -psych reevaluated patient on 3/22- still recommended inpatient psych admission  Acute toxic encephalopathy -with inability to protect airway/in the setting of intentional overdose requiring intubation -drug screen negative -CT head unremarkable  -Improved, appears to be at baseline  Acute pulmonary edema -Improved, was on IV lasix -CXR on 3/21 reviewed and unremarkable for infection or edema  Shortness of breath and chest pain suspect anxiety component -SOB resolved with trach placement -no further complaints of chest pain -Chest x-ray, EKG, troponin, d-dimer are unremarkable  Hypotension -Resolved, patient has a history of hypertension  Acute kidney injury and hyperkalemia -BUN/creatinine on admission 19/0.8 currently 35/1.0 -continue to monitor BMP-currently not on diuretics  Hypomagnesemia -resolved with replacement and last check was 2.1 -Feeding labs this a.m.   Seizure disorder -currently stable and on no medications -monitor closely given NPO status and psych meds cannot be administered  Anxiety/depression -needs inpatient psych admission -medications as above- currently held  Diabetes mellitus, type II with hyperglycemia -currently on steroids -continue Lantus 38 units at bedtime home dose 28 7030 twice daily -Holding metformin 1000 twice daily, Actos 15 daily  CBG monitoring- changed to q4h-CBGs ranging from  139-187  Hypothyroidism -transitioned to IV synthroid as patient is currently NPO  DVT Prophylaxis  lovenox  Code Status: Full  Family Communication: Husband and mother Disposition Plan: Inpatient psychiatry.  Consultants PCCM Psychiatry  ENT  Procedures  Intubation/extubation Renal US transnasal fiberoptic laryngoscopy x 2 Tracheostomy and direct laryngoscopy   Antibiotics   Anti-infectives (From admission, onward)   Start     Dose/Rate Route Frequency Ordered Stop   12/03/17 1800  clindamycin (CLEOCIN) IVPB 300 mg     300 mg 100 mL/hr over 30 Minutes Intravenous Every 6 hours 12/03/17 1458        Subjective:     Objective:   Vitals:   12/04/17 0315 12/04/17 0411 12/04/17 0759 12/04/17 0809  BP:  118/73 113/71   Pulse: 78 87 96 (!) 104  Resp: (!) 21 18 17 16   Temp:  97.8 F (36.6 C) 97.9 F (36.6 C)   TempSrc:  Oral Oral   SpO2: (!) 89% 100% 99% 100%  Weight:  133.5 kg (294 lb 5 oz)    Height:        Intake/Output Summary (Last 24 hours) at 12/04/2017 0828 Last data filed at 12/04/2017 0411 Gross per 24 hour  Intake 110 ml  Output -  Net 110 ml   Filed Weights   12/02/17 0742 12/03/17 0646 12/04/17 0411  Weight: (!) 136.3 kg (300 lb 7.8 oz) 135.3 kg (298 lb 4.5 oz) 133.5 kg (294 lb 5 oz)   Exam  General: Well developed, well nourished, morbidly obese  HEENT: NCAT, mucous membranes moist.   Neck: Trach  Cardiovascular: S1 S2 auscultated, RRR, no murmur.  Respiratory: Clear to auscultation bilaterally with equal chest rise  Abdomen: Soft, obese, nontender, nondistended, + bowel sounds  Extremities: warm dry without cyanosis clubbing or edema  Neuro: AAOx3, nonfocal  Psych: Appropriate mood and affect  Data Reviewed: I have personally reviewed following labs and imaging studies  CBC: Recent Labs  Lab 11/29/17 0409 11/30/17 0640 12/02/17 1147  WBC 17.3* 18.5* 19.1*  HGB 12.7 13.5 15.1*  HCT 39.1 43.5 46.6*  MCV 90.1 92.4 91.2   PLT 460* 403* 660*   Basic Metabolic Panel: Recent Labs  Lab 11/29/17 0409 11/30/17 0640 12/02/17 1147  NA 138 139 139  K 4.4 4.7 4.0  CL 97* 96* 96*  CO2 29 28 27   GLUCOSE 266* 292* 243*  BUN 22* 27* 35*  CREATININE 0.83 0.84 1.02*  CALCIUM 9.4 9.4 9.6  MG 1.8 2.1 2.1  PHOS 3.5  --   --    GFR: Estimated Creatinine Clearance: 94.7 mL/min (A) (by C-G formula based on SCr of 1.02 mg/dL (H)). Liver Function Tests: Recent Labs  Lab 12/02/17 1147  AST 19  ALT 18  ALKPHOS 104  BILITOT 0.8  PROT 7.4  ALBUMIN 2.7*   No results for input(s): LIPASE, AMYLASE in the last 168 hours. No results for input(s): AMMONIA in the last 168 hours. Coagulation Profile: No results for input(s): INR, PROTIME in the last 168 hours. Cardiac Enzymes: No results for input(s): CKTOTAL, CKMB, CKMBINDEX, TROPONINI in the last 168 hours. BNP (last 3 results) No results for input(s): PROBNP in the last 8760 hours. HbA1C: No results for input(s): HGBA1C in the last 72 hours. CBG: Recent Labs  Lab 12/03/17 1618 12/03/17 1951 12/03/17 2338 12/04/17 0404 12/04/17 0731  GLUCAP 223* 218* 187* 157* 139*   Lipid Profile: No results for input(s): CHOL, HDL, LDLCALC, TRIG, CHOLHDL, LDLDIRECT in the last 72 hours. Thyroid Function Tests: No results for input(s): TSH, T4TOTAL, FREET4, T3FREE, THYROIDAB in the last 72 hours. Anemia Panel: No results for input(s): VITAMINB12, FOLATE, FERRITIN, TIBC, IRON, RETICCTPCT in the last 72 hours. Urine analysis:  Component Value Date/Time   COLORURINE RED (A) 10/31/2016 2130   APPEARANCEUR CLOUDY (A) 10/31/2016 2130   LABSPEC 1.027 10/31/2016 2130   PHURINE 6.0 10/31/2016 2130   GLUCOSEU >=500 (A) 10/31/2016 2130   HGBUR LARGE (A) 10/31/2016 2130   BILIRUBINUR NEGATIVE 10/31/2016 2130   KETONESUR NEGATIVE 10/31/2016 2130   PROTEINUR 100 (A) 10/31/2016 2130   UROBILINOGEN 0.2 04/09/2014 1157   NITRITE NEGATIVE 10/31/2016 2130   LEUKOCYTESUR  NEGATIVE 10/31/2016 2130   Sepsis Labs: @LABRCNTIP (procalcitonin:4,lacticidven:4)  ) Recent Results (from the past 240 hour(s))  MRSA PCR Screening     Status: None   Collection Time: 11/28/17 11:17 AM  Result Value Ref Range Status   MRSA by PCR NEGATIVE NEGATIVE Final    Comment:        The GeneXpert MRSA Assay (FDA approved for NASAL specimens only), is one component of a comprehensive MRSA colonization surveillance program. It is not intended to diagnose MRSA infection nor to guide or monitor treatment for MRSA infections. Performed at [facility], [place] [address], [place], [place] [address]   Culture, respiratory (NON-Expectorated)     Status: None   Collection Time: 11/30/17  4:28 AM  Result Value Ref Range Status   Specimen Description TRACHEAL ASPIRATE  Final   Special Requests NONE  Final   Gram Stain   Final    MODERATE WBC PRESENT, PREDOMINANTLY PMN FEW GRAM POSITIVE COCCI    Culture   Final    Consistent with normal respiratory flora. Performed at [facility], [place] [address], [place], [place] [address]    Report Status 12/02/2017 FINAL  Final      Radiology Studies: No results found.   Scheduled Meds: . buPROPion  100 mg Oral BID  . chlorhexidine  15 mL Mouth Rinse BID  . cholestyramine  4 g Oral QODAY  . enoxaparin (LOVENOX) injection  60 mg Subcutaneous Q24H  . gabapentin  300 mg Oral BID  . insulin aspart  0-20 Units Subcutaneous Q4H  . insulin glargine  38 Units Subcutaneous QHS  . ipratropium-albuterol  3 mL Nebulization TID  . levothyroxine  37.5 mcg Intravenous Daily  . lidocaine  4 mL Intradermal Once  . mouth rinse  15 mL Mouth Rinse q12n4p  . pantoprazole (PROTONIX) IV  40 mg Intravenous BID  . protein supplement shake  11 oz Oral BID BM  . Racepinephrine HCl  0.5 mL Nebulization Once   Continuous Infusions: . clindamycin (CLEOCIN) IV Stopped (12/04/17 0806)     LOS: 14 days   Time Spent in minutes   30  minutes  Verneita Griffes, MD [facility] [phone]

## 2017-12-04 NOTE — Progress Notes (Signed)
  Speech Language Pathology Treatment: Dysphagia;Passy Muir Speaking valve  Patient Details Name: [name] MRN: [id number] DOB: April 14, 1976 Today's Date: 12/04/2017 Time: 3570-1779 SLP Time Calculation (min) (ACUTE ONLY): 24 min  Assessment / Plan / Recommendation Clinical Impression  Pt positioned in chair with nursing staff in room, recently completed oral care. Pt's trach presented with strong odor and required suction exterior trach suction to remove small amounts of bloody mucus. Pt attempted to phonate and SLP placed PMV to allow phonation. Pt demonstrated ability to redirect air to upper airway to allow phonation, however phonation remain weak, low vocal intensity, hoarse and intelligibility reduce at word level. SLP assessed tolerance of PMV at 10 minute interval, pt demonstrated no overt s/s of distress, vital signs remained stable and no air stacking noted. SLP instructed pt in diaphragmic breathing exercises to reduce anxeity and increase vocal intensity, pt required max A verbal cues to increase vocal intensity.SLP provided ice chip trials to assess tolerance of PMV with PO consumption, pt demonstrated slightly wet vocal quality and required verbal cues to clear throat, which lead to coughing and required oral suction by pt. PMV was removed after 20 minutes with noted slight air stacking, likely due to structural impairment of edema and addition of residue in pharyngeal cavity following ice chip trials. SLP noted increased bleeding around trach by the end of the session, likely due to movement of trach with PMV placement and attempts to cough. Medical staff should consider a temporary mean of nurition, since oral nutrition is not safe at this time due to reduced PMV tolerance, which is indicative of continued edema with significant dysphagia. Discussed with MD.    HPI HPI: Patient is a 42 y/o female who presents with drug OD and Sz. Intubated 3/13-3/15. resp failure with emergent trach  3/21.  ENT flexible laryngoscopy revealed posterior supraglottic swelling; swollen vocal folds with no abduction; edematous arytenoids; entire subglottis circumferentially swollen with necrotic debris and fibrinous exudate.       SLP Plan  Continue with current plan of care       Recommendations  Diet recommendations: NPO(ice chips for pleasure following oral care) Supervision: Full supervision/cueing for compensatory strategies(only with SLP) Postural Changes and/or Swallow Maneuvers: Seated upright 90 degrees      Patient may use Passy-Muir Speech Valve: with SLP only PMSV Supervision: Full         Oral Care Recommendations: Oral care BID SLP Visit Diagnosis: Dysphagia, pharyngeal phase (R13.13);Aphonia (R49.1) Plan: Continue with current plan of care       [place] 12/04/2017, 9:53 AM

## 2017-12-05 LAB — BASIC METABOLIC PANEL
Anion gap: 14 (ref 5–15)
BUN: 38 mg/dL — ABNORMAL HIGH (ref 6–20)
CALCIUM: 9 mg/dL (ref 8.9–10.3)
CO2: 25 mmol/L (ref 22–32)
CREATININE: 0.92 mg/dL (ref 0.44–1.00)
Chloride: 98 mmol/L — ABNORMAL LOW (ref 101–111)
GFR calc Af Amer: >60 mL/min (ref >60)
GLUCOSE: 110 mg/dL — AB (ref 65–99)
Potassium: 3.6 mmol/L (ref 3.5–5.1)
Sodium: 137 mmol/L (ref 135–145)

## 2017-12-05 LAB — CBC WITH DIFFERENTIAL/PLATELET
BASOS PCT: 0 %
Basophils Absolute: 0 10*3/uL (ref 0.0–0.1)
EOS PCT: 0 %
Eosinophils Absolute: 0 10*3/uL (ref 0.0–0.7)
HEMATOCRIT: 41.7 % (ref 36.0–46.0)
Hemoglobin: 13.4 g/dL (ref 12.0–15.0)
Lymphocytes Relative: 18 %
Lymphs Abs: 2.2 10*3/uL (ref 0.7–4.0)
MCH: 28.8 pg (ref 26.0–34.0)
MCHC: 32.1 g/dL (ref 30.0–36.0)
MCV: 89.5 fL (ref 78.0–100.0)
MONO ABS: 0.9 10*3/uL (ref 0.1–1.0)
MONOS PCT: 8 %
NEUTROS ABS: 8.9 10*3/uL — AB (ref 1.7–7.7)
Neutrophils Relative %: 74 %
PLATELETS: 472 10*3/uL — AB (ref 150–400)
RBC: 4.66 MIL/uL (ref 3.87–5.11)
RDW: 12.9 % (ref 11.5–15.5)
WBC: 12 10*3/uL — ABNORMAL HIGH (ref 4.0–10.5)

## 2017-12-05 LAB — GLUCOSE, CAPILLARY
GLUCOSE-CAPILLARY: 138 mg/dL — AB (ref 65–99)
Glucose-Capillary: 115 mg/dL — ABNORMAL HIGH (ref 65–99)
Glucose-Capillary: 116 mg/dL — ABNORMAL HIGH (ref 65–99)
Glucose-Capillary: 116 mg/dL — ABNORMAL HIGH (ref 65–99)
Glucose-Capillary: 132 mg/dL — ABNORMAL HIGH (ref 65–99)
Glucose-Capillary: 149 mg/dL — ABNORMAL HIGH (ref 65–99)

## 2017-12-05 MED ORDER — OXYCODONE-ACETAMINOPHEN 5-325 MG PO TABS
1.0000 | ORAL_TABLET | Freq: Three times a day (TID) | ORAL | Status: DC | PRN
  Filled 2017-12-05: qty 1

## 2017-12-05 MED ORDER — FENTANYL CITRATE (PF) 100 MCG/2ML IJ SOLN
50.0000 ug | INTRAMUSCULAR | Status: DC | PRN
  Administered 2017-12-05 – 2017-12-07 (×7): 50 ug via INTRAVENOUS
  Filled 2017-12-05 (×7): qty 2

## 2017-12-05 MED ORDER — FENTANYL CITRATE (PF) 100 MCG/2ML IJ SOLN: 50.0000 ug | INTRAMUSCULAR | Status: DC | PRN

## 2017-12-05 NOTE — Progress Notes (Signed)
Nutrition Follow-up  DOCUMENTATION CODES:   Morbid obesity  INTERVENTION:   Monitor for needs If it is felt that feeding tube placement would continue to irritate patient's edematous larynx/pharynx and increase length of healing process- recommend TPN  Attempted to reach ENT to discuss case x2 with no answer. Discussed with Dr. Verlon Au yesterday evening Discussed with speech pathology today  If tube feeding is pursued - recommend placement via Fluoro due to issues previous RD had with placement of cortrak going into patient's airway  Recommend Jev 1.2 begin at 10mL/hr, increase by 10 every 12 hours to goal rate of 45mL/hr Pro-stat 24mL BID  At goal, provides 2072 calories, 117 grams of protein, 1276ml free water  Patient has now gone 8 days without nutrition per patient.  NUTRITION DIAGNOSIS:   Increased nutrient needs related to acute illness as evidenced by estimated needs. -ongoing  GOAL:   Patient will meet greater than or equal to 90% of their needs -unmet  MONITOR:   PO intake, I & O's, Weight trends, Labs, Supplement acceptance  ASSESSMENT:   42 yo female admitted with acute toxic encephalopathy secondary to intentional OD on baclofan, trazadone and  tramadol, seizures. Pt intubate for airway protection. Pt with hx of Cushing's, DM, OSA  3/21 - Underwent laryngoscopy and trached 3/22 - Still suffering from pharyngeal dysphagia due to edmea, unable to use PMSV. 3/23 - Cortrak unable to be placed due to nare sensitivity, tube going into airway 3/25 - MBSS completed - patient recommended to continue to be NPO 3/27 - SLP evaluated again, recommended alternative means of nutrition, RD paged MD about cortrak placement and tubefeeding x2, he is deferring to speech pathology and ENT. 3/28 - Discussed with speech pathology - concerned that tube placement may irritate already edematous larynx/pharynx, if tube feeding is pursued, recommend place tube via fluoro per previous  RD attempting to place cortrak tube and tube kept going into airway. If felt this will only exacerbate dysphagia, recommend TPN  Will continue to monitor for needs.  Labs reviewed:  CBGs 115-132 Medications reviewed and include:  Questran, Insulin   Diet Order:  Diet NPO time specified  EDUCATION NEEDS:   Not appropriate for education at this time  Skin:  Skin Assessment: Skin Integrity Issues: Skin Integrity Issues:: Other (Comment) Other: MASD: abdomen, bilateral groin, under breasts  Last BM:  12/03/2017  Height:   Ht Readings from Last 1 Encounters:  11/28/17 5\' 2"  (1.575 m)    Weight:   Wt Readings from Last 1 Encounters:  12/05/17 (!) 303 lb 2.1 oz (137.5 kg)    Ideal Body Weight:  52.3 kg  BMI:  Body mass index is 55.44 kg/m.  Estimated Nutritional Needs:   Kcal:  1832-2200 calories (ABW x25-30)  Protein:  115-131 grams  Fluid:  1.8-2.2L  Satira Anis. Ifeoluwa Bartz, MS, RD LDN Inpatient Clinical Dietitian Pager [phone]

## 2017-12-05 NOTE — Progress Notes (Signed)
RN called RT for pt assessment due to pt desatting <88% on 24% ATC.  Rhonci BS bilaterally, RT suctioned PT and a small clot was removed.  Sats are now 95% on 35% ATC.  RT will continue to monitor.

## 2017-12-05 NOTE — Progress Notes (Signed)
Physical Therapy Treatment Patient Details Name: [name] MRN: [id number] DOB: 1976/04/12 Today's Date: 12/05/2017    History of Present Illness Patient is a 42 y/o female who presents with drug OD and Sz. Intubated 3/13-3/15. resp failure with emergent trach 3/21 due to vocal cord dysfunction. PMH includes ORIF RLE, depression, anxiety, morbid obesity, DM, Cushing's, Rt uni knee replacement, HTN, left ankle surgery, dyslipidemia, hypothyroidism.     PT Comments    Patient progressing slowly with mobility due to anxiety and increase HR.  Still, though able this session to walk without chair following and able to increase distance.  Encouraged her because she can progress to less support with more therapy.  Will continue to follow acutely.  Follow Up Recommendations  Supervision for mobility/OOB;Other (comment)(possible post acute therapy vs to [facility])     Equipment Recommendations  Rolling walker with 5" wheels    Recommendations for Other Services       Precautions / Restrictions Precautions Precautions: Fall Precaution Comments: tachycardia; suicide precautions, trach    Mobility  Bed Mobility               General bed mobility comments: Pt was OOB in the recliner chair.   Transfers   Equipment used: Rolling walker (2 wheeled) Transfers: Sit to/from Stand Sit to Stand: [employer] transfer comment: min guard assist for safety   Ambulation/Gait Ambulation/Gait assistance: Min guard Ambulation Distance (Feet): 180 Feet Assistive device: Rolling walker (2 wheeled) Gait Pattern/deviations: Step-through pattern;Decreased stride length;Wide base of support     General Gait Details: stops multiple times to breathe, initially on RA, then HR up to 133, placed on 30% TC and pt stopped to rest several times with HR 138 at max.  RN cleaned trach prior to ambulation   Stairs            Wheelchair Mobility    Modified Rankin (Stroke Patients  Only)       Balance Overall balance assessment: Needs assistance Sitting-balance support: Feet supported;No upper extremity supported Sitting balance-Leahy Scale: Good     Standing balance support: No upper extremity supported Standing balance-Leahy Scale: Fair Standing balance comment: standing to remove gown prior to sitting                            Cognition Arousal/Alertness: Awake/alert Behavior During Therapy: WFL for tasks assessed/performed Overall Cognitive Status: Within Functional Limits for tasks assessed                                        Exercises      General Comments        Pertinent Vitals/Pain Faces Pain Scale: Hurts whole lot Pain Location: headache Pain Descriptors / Indicators: Headache Pain Intervention(s): Monitored during session;Limited activity within patient's tolerance;Patient requesting pain meds-RN notified    Home Living                      Prior Function            PT Goals (current goals can now be found in the care plan section) Acute Rehab PT Goals Patient Stated Goal: to get stronger  PT Goal Formulation: With patient Time For Goal Achievement: 12/19/17 Potential to Achieve Goals: Good Progress towards PT goals: Progressing toward  goals(slowly; goals extended this session)    Frequency    Min 3X/week      PT Plan Current plan remains appropriate    Co-evaluation              AM-PAC PT "6 Clicks" Daily Activity  Outcome Measure  Difficulty turning over in bed (including adjusting bedclothes, sheets and blankets)?: A Little Difficulty moving from lying on back to sitting on the side of the bed? : Unable Difficulty sitting down on and standing up from a chair with arms (e.g., wheelchair, bedside commode, etc,.)?: Unable Help needed moving to and from a bed to chair (including a wheelchair)?: A Little Help needed walking in hospital room?: A Little Help needed climbing  3-5 steps with a railing? : A Lot 6 Click Score: 13    End of Session Equipment Utilized During Treatment: Oxygen Activity Tolerance: Patient tolerated treatment well Patient left: in chair;with call bell/phone within reach;with nursing/sitter in room   PT Visit Diagnosis: Muscle weakness (generalized) (M62.81);Unsteadiness on feet (R26.81);Difficulty in walking, not elsewhere classified (R26.2)     Time: 1510-1536 PT Time Calculation (min) (ACUTE ONLY): 26 min  Charges:  $Gait Training: 8-22 mins $Therapeutic Activity: 8-22 mins                    G CodesMagda Kiel, [place] [phone] 12/05/2017    Reginia Naas 12/05/2017, 4:32 PM

## 2017-12-05 NOTE — Progress Notes (Signed)
PROGRESS NOTE    Malie Kashani Bollier  WPY:[id number] DOB: December 19, 1975 DOA: 11/20/2017 PCP: Sandi Mariscal, MD   Brief Narrative:  41-year- Body mass index is 55.44 kg/m.Cushing's disease, diabetes on insulin, depression, hypertension in a setting pfo priopor pre-eclmapsia when previously pregnant, OSA ? On cvpap-prior TIb plateau # 07/2015, R side knee arthroplasty Admitted on 3/13 to ICU for intentional drug overdose of baclofen, trazodone and tramadol.   Was encephalopathic and had generalized seizure in ER therefore patient was intubated and admitted by pulmonary critical care.  Patient was evaluated by psychiatrist who recommended inpatient psych on discharge.  Started on diuretics for pulmonary congestion.  Transferred to [facility] on 11/24/2017.  Patient developed stridor and was seen by ENT, found to have irregular inflammatory vocal cord changes. ENT recommended medrol dose pack and PPI.  Patient developed increasing stridor. Rapid response was consulted, patient given racemic epi.   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 S/p tracheostomy.    Pyschiatry reevaluated patient and still recommending inpatient psychiatric admission.   Subjective on 12/04/17     Assessment & Plan   Acute respiratory failure secondary to stridor/Glottic obstruction  -ENT, Dr. Redmond Baseman, consulted and appreciated- s/p transnasal fiberoptic laryngoscopy: posterior vocal folds have irregular inflammatory changes at the vocal processes. -patient was given 2 doses of IV decadron -As above, developed increasing stridor.  ENT reevaluated patient with bedside laryngoscopy 11/26/17 found posterior supraglottic swelling.  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   -s/p tracheostomy 11/28/17 -speech therapy recommended NPO and placement of Cortrak under fluoroscopy  -Worsening odor from [place] 3/26 :  Discussed with the ENT Team, and IV Clindamycin was advised. ENT will direct care. Hold IV solumedrol . Worsening leukocytosis noted.  Have called and discussed with Dr. Constance Holster 3/27 --patient stable with no futher bleed 3/28 -SLP to assess implications of next evaluation 3/29 with MD and appreciate call--might need PEG tube > than Cortrak if felt unsafe due to layngeal irritation from tube  Intentional overdose with suicidal ideation -required intubation and is now extubated -psychiatry consulted and appreciated,  -psych reevaluated patient on 3/22- still recommended inpatient psych admission  Inability to take meds -meds have not yet been resumed given n.p.o. defer to ENT as well as speech therapy regarding the same -Have asked nursing to correct [facility] as meds have been documented as being given  Acute toxic encephalopathy -with inability to protect airway/in the setting of intentional overdose requiring intubation -drug screen negative -CT head unremarkable  -Improved, appears to be at baseline  Acute pulmonary edema -Improved, was on IV lasix -CXR on 3/21 reviewed and unremarkable for infection or edema  Shortness of breath and chest pain suspect anxiety component -SOB resolved with trach placement -no further complaints of chest pain -Chest x-ray, EKG, troponin, d-dimer are unremarkable  Hypotension, sinus tachycardia -Resolved, patient has a history of hypertension -Will give IV metoprolo 5 mg every 6 for heart rate sustained above 120   Acute kidney injury and hyperkalemia -BUN/creatinine on admission 19/0.8 currently 35/1.0-->38/0.9 so starting IVF 50 cc/h 3/28  Hypomagnesemia -resolved with replacement and last check was 2.1 -follow labs from this a.m.   Seizure disorder -currently stable and on no medications -monitor closely given NPO status and psych meds cannot be administered  Anxiety/depression -needs inpatient psych admission -medications as above-  currently held  Diabetes mellitus, type II with hyperglycemia -currently on steroids -continue Lantus 38 units at  bedtime home dose 28 7030 twice daily -Holding metformin 1000 twice daily, Actos 15 daily  CBG monitoring- changed to q4h-CBGs ranging from 116-132  Hypothyroidism -transitioned to IV synthroid as patient is currently NPO  DVT Prophylaxis  lovenox Code Status: Full Family Communication: Called and discussed with husband on the phone Disposition Plan: Inpatient psychiatry.  Consultants PCCM Psychiatry  ENT  Procedures  Intubation/extubation Renal US transnasal fiberoptic laryngoscopy x 2 Tracheostomy and direct laryngoscopy   Antibiotics   Anti-infectives (From admission, onward)   Start     Dose/Rate Route Frequency Ordered Stop   12/03/17 1800  clindamycin (CLEOCIN) IVPB 300 mg     300 mg 100 mL/hr over 30 Minutes Intravenous Every 6 hours 12/03/17 1458        Subjective:   Fair but has HA-goes from top of head to back No n/v/cp  Objective:   Vitals:   12/05/17 0736 12/05/17 1102 12/05/17 1112 12/05/17 1558  BP: 106/64  (!) 113/53 117/68  Pulse:    89  Resp:    (!) 24  Temp: 97.7 F (36.5 C)  98.2 F (36.8 C) 98.7 F (37.1 C)  TempSrc: Oral  Oral Oral  SpO2:  95%  97%  Weight:      Height:       No intake or output data in the 24 hours ending 12/05/17 1612 Filed Weights   12/03/17 0646 12/04/17 0411 12/05/17 0431  Weight: 135.3 kg (298 lb 4.5 oz) 133.5 kg (294 lb 5 oz) (!) 137.5 kg (303 lb 2.1 oz)   Exam  Obese pleasant trach in place-sutures in place some moisture around the trach no blood-looks clean otherwise On 6-8 liters humidified No head deformity s1 s2 no m/r/g RRR Chest clear  abd soft nt nd  Neuro intact  Data Reviewed: I have personally reviewed following labs and imaging studies  CBC: Recent Labs  Lab 11/29/17 0409 11/30/17 0640 12/02/17 1147 12/04/17 1058 12/05/17 0601  WBC 17.3* 18.5* 19.1* 16.7* 12.0*    NEUTROABS  --   --   --  12.9* 8.9*  HGB 12.7 13.5 15.1* 15.2* 13.4  HCT 39.1 43.5 46.6* 46.2* 41.7  MCV 90.1 92.4 91.2 89.7 89.5  PLT 460* 403* 456* 491* 814*   Basic Metabolic Panel: Recent Labs  Lab 11/29/17 0409 11/30/17 0640 12/02/17 1147 12/04/17 1058 12/05/17 0601  NA 138 139 139 137 137  K 4.4 4.7 4.0 3.9 3.6  CL 97* 96* 96* 96* 98*  CO2 29 28 27 25 25   GLUCOSE 266* 292* 243* 185* 110*  BUN 22* 27* 35* 37* 38*  CREATININE 0.83 0.84 1.02* 0.92 0.92  CALCIUM 9.4 9.4 9.6 9.7 9.0  MG 1.8 2.1 2.1  --   --   PHOS 3.5  --   --   --   --    GFR: Estimated Creatinine Clearance: 105.9 mL/min (by C-G formula based on SCr of 0.92 mg/dL). Liver Function Tests: Recent Labs  Lab 12/02/17 1147  AST 19  ALT 18  ALKPHOS 104  BILITOT 0.8  PROT 7.4  ALBUMIN 2.7*   No results for input(s): LIPASE, AMYLASE in the last 168 hours. No results for input(s): AMMONIA in the last 168 hours. Coagulation Profile: No results for input(s): INR, PROTIME in the last 168 hours. Cardiac Enzymes: No results for input(s): CKTOTAL, CKMB, CKMBINDEX, TROPONINI in the last 168 hours. BNP (last 3 results) No results for input(s): PROBNP in the last 8760 hours. HbA1C: No results  for input(s): HGBA1C in the last 72 hours. CBG: Recent Labs  Lab 12/04/17 2011 12/05/17 0017 12/05/17 0419 12/05/17 0734 12/05/17 1121  GLUCAP 198* 116* 115* 116* 132*   Lipid Profile: No results for input(s): CHOL, HDL, LDLCALC, TRIG, CHOLHDL, LDLDIRECT in the last 72 hours. Thyroid Function Tests: No results for input(s): TSH, T4TOTAL, FREET4, T3FREE, THYROIDAB in the last 72 hours. Anemia Panel: No results for input(s): VITAMINB12, FOLATE, FERRITIN, TIBC, IRON, RETICCTPCT in the last 72 hours. Urine analysis:    Component Value Date/Time   COLORURINE RED (A) 10/31/2016 2130   APPEARANCEUR CLOUDY (A) 10/31/2016 2130   LABSPEC 1.027 10/31/2016 2130   PHURINE 6.0 10/31/2016 2130   GLUCOSEU >=500 (A)  10/31/2016 2130   HGBUR LARGE (A) 10/31/2016 2130   BILIRUBINUR NEGATIVE 10/31/2016 2130   KETONESUR NEGATIVE 10/31/2016 2130   PROTEINUR 100 (A) 10/31/2016 2130   UROBILINOGEN 0.2 04/09/2014 1157   NITRITE NEGATIVE 10/31/2016 2130   LEUKOCYTESUR NEGATIVE 10/31/2016 2130   Sepsis Labs: @LABRCNTIP (procalcitonin:4,lacticidven:4)  ) Recent Results (from the past 240 hour(s))  MRSA PCR Screening     Status: None   Collection Time: 11/28/17 11:17 AM  Result Value Ref Range Status   MRSA by PCR NEGATIVE NEGATIVE Final    Comment:        The GeneXpert MRSA Assay (FDA approved for NASAL specimens only), is one component of a comprehensive MRSA colonization surveillance program. It is not intended to diagnose MRSA infection nor to guide or monitor treatment for MRSA infections. Performed at [facility], [place] [address], [place], [place] [address]   Culture, respiratory (NON-Expectorated)     Status: None   Collection Time: 11/30/17  4:28 AM  Result Value Ref Range Status   Specimen Description TRACHEAL ASPIRATE  Final   Special Requests NONE  Final   Gram Stain   Final    MODERATE WBC PRESENT, PREDOMINANTLY PMN FEW GRAM POSITIVE COCCI    Culture   Final    Consistent with normal respiratory flora. Performed at [facility], [place] [address], [place], [place] [address]    Report Status 12/02/2017 FINAL  Final      Radiology Studies: No results found.   Scheduled Meds: . buPROPion  100 mg Oral BID  . chlorhexidine  15 mL Mouth Rinse BID  . cholestyramine  4 g Oral QODAY  . enoxaparin (LOVENOX) injection  60 mg Subcutaneous Q24H  . gabapentin  300 mg Oral BID  . insulin aspart  0-20 Units Subcutaneous Q4H  . insulin glargine  38 Units Subcutaneous QHS  . levothyroxine  37.5 mcg Intravenous Daily  . mouth rinse  15 mL Mouth Rinse q12n4p  . pantoprazole (PROTONIX) IV  40 mg Intravenous BID  . protein supplement shake  11 oz Oral BID BM  . Racepinephrine HCl   0.5 mL Nebulization Once   Continuous Infusions: . clindamycin (CLEOCIN) IV Stopped (12/05/17 1345)     LOS: 15 days   Time Spent in minutes   30 minutes  Verneita Griffes, MD [facility] [phone]

## 2017-12-05 NOTE — Progress Notes (Signed)
Received a call from dietician regarding recommendations for non-oral means of nutrition. Discussed patient with MD (Dr. Verlon Au). Patient has been without nutrition for 8 days per dietician and continues to present with significant dysphagia due to laryngeal edema. Temporary non-oral means of nutrition recommended although concerned that placement of cortrak may irritate an already edematous larynx/pharynx. Plan is to f/u with patient 3/29 for diagnostic treatment and then to discuss plan for non oral means of nutrition further if needed.   [place], [place] [phone]

## 2017-12-06 ENCOUNTER — Inpatient Hospital Stay (HOSPITAL_COMMUNITY): Payer: Medicaid Other

## 2017-12-06 LAB — CBC WITH DIFFERENTIAL/PLATELET
Basophils Absolute: 0 10*3/uL (ref 0.0–0.1)
Basophils Relative: 0 %
EOS ABS: 0.1 10*3/uL (ref 0.0–0.7)
EOS PCT: 0 %
HCT: 41.8 % (ref 36.0–46.0)
Hemoglobin: 13.7 g/dL (ref 12.0–15.0)
LYMPHS ABS: 2.6 10*3/uL (ref 0.7–4.0)
Lymphocytes Relative: 18 %
MCH: 29.1 pg (ref 26.0–34.0)
MCHC: 32.8 g/dL (ref 30.0–36.0)
MCV: 88.9 fL (ref 78.0–100.0)
MONO ABS: 1.1 10*3/uL — AB (ref 0.1–1.0)
MONOS PCT: 8 %
Neutro Abs: 11.2 10*3/uL — ABNORMAL HIGH (ref 1.7–7.7)
Neutrophils Relative %: 74 %
Platelets: 570 10*3/uL — ABNORMAL HIGH (ref 150–400)
RBC: 4.7 MIL/uL (ref 3.87–5.11)
RDW: 12.7 % (ref 11.5–15.5)
WBC: 15 10*3/uL — ABNORMAL HIGH (ref 4.0–10.5)

## 2017-12-06 LAB — RENAL FUNCTION PANEL
Albumin: 2.5 g/dL — ABNORMAL LOW (ref 3.5–5.0)
Anion gap: 14 (ref 5–15)
BUN: 31 mg/dL — AB (ref 6–20)
CHLORIDE: 97 mmol/L — AB (ref 101–111)
CO2: 24 mmol/L (ref 22–32)
CREATININE: 1.01 mg/dL — AB (ref 0.44–1.00)
Calcium: 9.1 mg/dL (ref 8.9–10.3)
GFR calc Af Amer: >60 mL/min (ref >60)
GFR calc non Af Amer: >60 mL/min (ref >60)
GLUCOSE: 90 mg/dL (ref 65–99)
Phosphorus: 3.4 mg/dL (ref 2.5–4.6)
Potassium: 3.5 mmol/L (ref 3.5–5.1)
SODIUM: 135 mmol/L (ref 135–145)

## 2017-12-06 LAB — GLUCOSE, CAPILLARY
GLUCOSE-CAPILLARY: 103 mg/dL — AB (ref 65–99)
GLUCOSE-CAPILLARY: 93 mg/dL (ref 65–99)
Glucose-Capillary: 117 mg/dL — ABNORMAL HIGH (ref 65–99)
Glucose-Capillary: 265 mg/dL — ABNORMAL HIGH (ref 65–99)
Glucose-Capillary: 292 mg/dL — ABNORMAL HIGH (ref 65–99)
Glucose-Capillary: 78 mg/dL (ref 65–99)
Glucose-Capillary: 95 mg/dL (ref 65–99)

## 2017-12-06 MED ORDER — KETOROLAC TROMETHAMINE 15 MG/ML IJ SOLN
15.0000 mg | Freq: Three times a day (TID) | INTRAMUSCULAR | Status: DC | PRN
  Administered 2017-12-06 – 2017-12-07 (×2): 15 mg via INTRAVENOUS
  Filled 2017-12-06 (×2): qty 1

## 2017-12-06 MED ORDER — LEVOTHYROXINE SODIUM 75 MCG PO TABS
75.0000 ug | ORAL_TABLET | Freq: Every day | ORAL | Status: DC
  Administered 2017-12-07 – 2018-01-07 (×31): 75 ug via ORAL
  Filled 2017-12-06 (×35): qty 1

## 2017-12-06 MED ORDER — METOPROLOL TARTRATE 12.5 MG HALF TABLET
12.5000 mg | ORAL_TABLET | Freq: Two times a day (BID) | ORAL | Status: DC
  Administered 2017-12-06 – 2018-01-07 (×55): 12.5 mg via ORAL
  Filled 2017-12-06 (×63): qty 1

## 2017-12-06 MED ORDER — BUPROPION HCL ER (SR) 100 MG PO TB12: 100.0000 mg | ORAL_TABLET | Freq: Two times a day (BID) | ORAL | Status: DC

## 2017-12-06 MED ORDER — METHYLPREDNISOLONE SODIUM SUCC 125 MG IJ SOLR
INTRAMUSCULAR | Status: AC
  Administered 2017-12-07: 40 mg
  Filled 2017-12-06: qty 2

## 2017-12-06 MED ORDER — METHYLPREDNISOLONE SODIUM SUCC 40 MG IJ SOLR: 40.0000 mg | Freq: Once | INTRAMUSCULAR | Status: AC

## 2017-12-06 MED ORDER — LEVOTHYROXINE SODIUM 25 MCG PO TABS: 125.0000 ug | ORAL_TABLET | Freq: Every day | ORAL | Status: DC

## 2017-12-06 NOTE — Progress Notes (Signed)
Modified Barium Swallow Progress Note  Patient Details  Name: [name] MRN: [id number] Date of Birth: 1976/04/09  Today's Date: 12/06/2017  Modified Barium Swallow completed.  Full report located under Chart Review in the Imaging Section.  Brief recommendations include the following:  Clinical Impression  Pt presents with improved pharyngeal swallow.  Edema persists, but does not impede bolus passage through UES nor prevent airway protection.  Purees and mechanical solids pass easily into esophagus with no residue post-swallow.  Thin liquids were observed to consistently penetrate under the epiglottis, but they did not reach the vocal folds and were not aspirated.  Use of PMV did not impact the swallow.  Recommend pt begin a dysphagia 3 diet with thin liquids; crush meds in puree.  Discussed results/recs; pt engaged, smiling, and asking for jello.  SLP will follow for PO toleration/safety.     Swallow Evaluation Recommendations       SLP Diet Recommendations: Dysphagia 3 (Mech soft) solids;Thin liquid   Liquid Administration via: Cup;Straw   Medication Administration: Crushed with puree   Supervision: Patient able to self feed   Compensations: Small sips/bites   Postural Changes: Remain semi-upright after after feeds/meals (Comment)   Oral Care Recommendations: Oral care BID        [name] 12/06/2017,12:29 PM

## 2017-12-06 NOTE — Progress Notes (Signed)
Pt's observed to have coughed out small amount of blood from her trach, cleaned up and fresh split gauge applied, pt advised to try to minimize the tension while coughing as she seems to be having dry hivies, will however continue to monitor. [place], Davian Wollenberg Efe

## 2017-12-06 NOTE — Progress Notes (Signed)
PROGRESS NOTE    [name]  QIH:[id number] DOB: 1976-04-07 DOA: 11/20/2017 PCP: Sandi Mariscal, MD   Brief Narrative:  41-year- Body mass index is 54.84 kg/m.Cushing's disease, diabetes on insulin, depression, hypertension in a setting pfo priopor pre-eclmapsia when previously pregnant, OSA ? On cvpap-prior TIb plateau # 07/2015, R side knee arthroplasty Admitted on 3/13 to ICU for intentional drug overdose of baclofen, trazodone and tramadol.   Was encephalopathic and had generalized seizure in ER therefore patient was intubated and admitted by pulmonary critical care.  Patient was evaluated by psychiatrist who recommended inpatient psych on discharge.  Started on diuretics for pulmonary congestion.  Transferred to [facility] on 11/24/2017.  Patient developed stridor and was seen by ENT, found to have irregular inflammatory vocal cord changes. ENT recommended medrol dose pack and PPI.  Patient developed increasing stridor. Rapid response was consulted, patient given racemic epi.   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 S/p tracheostomy.    Pyschiatry reevaluated patient and still recommending inpatient psychiatric admission.   Subjective on 12/04/17     Assessment & Plan   Acute respiratory failure secondary to stridor/Glottic obstruction  -ENT, Dr. Redmond Baseman, consulted and appreciated- s/p transnasal fiberoptic laryngoscopy: posterior vocal folds have irregular inflammatory changes at the vocal processes. -patient was given 2 doses of IV decadron -As above, developed increasing stridor.  ENT reevaluated patient with bedside laryngoscopy 11/26/17 found posterior supraglottic swelling.  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   -s/p tracheostomy 11/28/17. -Had some superficial bleeding from tracheotomy and discussed on 3/27- discussed with Dr. Constance Holster 3/27 --patient  stable with no futher bleed 3/28 -speech therapy recommended NPO and placement of Cortrak under fluoroscopy  -Worsening odor from [place] 3/26 : Discussed with the ENT Team, and IV Clindamycin was advised. Hold IV solumedrol -SLP did perform swallow eval and upgraded to dysphagia 3 diet which she is tolerating well  Intentional overdose with suicidal ideation -required intubation and is now extubated -psychiatry consulted and appreciated,  -psych reevaluated patient on 3/22- still recommended inpatient psych admission -Resuming Wellbutrin by mouth every 12 100 mg holding gabapentin 600 twice daily holding Vistaril for now, resume trazodone 50 at bedtime   Inability to take meds -meds will be resumed slowly  Acute toxic encephalopathy -with inability to protect airway/in the setting of intentional overdose requiring intubation -drug screen negative -CT head unremarkable  -Improved, appears to be at baseline  Acute pulmonary edema -Improved, was on IV lasix -CXR on 3/21 reviewed and unremarkable for infection or edema  Shortness of breath and chest pain suspect anxiety component -SOB resolved with trach placement -no further complaints of chest pain -Chest x-ray, EKG, troponin, d-dimer are unremarkable  Hypotension, sinus tachycardia -Resolved, patient has a history of hypertension -Transition to metoprolol 12.5 twice daily and titrate to goal  Acute kidney injury and hyperkalemia -BUN/creatinine on admission 19/0.8 currently 35/1.0-->38/0.9 so starting IVF 50 cc/h 3/28  Hypomagnesemia -resolved with replacement and last check was 2.1 Rechecking 3/30   Seizure disorder -currently stable and on no medications -monitor closely given NPO status and psych meds cannot be administered  Anxiety/depression -needs inpatient psych admission -medications as above- currently held  Diabetes mellitus, type II with hyperglycemia -currently on steroids -continue Lantus 38 units at  bedtime home dose 28 70/30 twice daily -Holding metformin 1000 twice daily, Actos 15 daily  CBG monitoring- changed to q4h-CBGs ranging from 90-117  Hypothyroidism -transitioned  from IV 37.5-75  DVT Prophylaxis  lovenox Code Status: Full Family Communication: Met with husband on 3/28 and 3/29 Disposition Plan: Inpatient psychiatry.  Consultants PCCM Psychiatry  ENT  Procedures  Intubation/extubation Renal US transnasal fiberoptic laryngoscopy x 2 Tracheostomy and direct laryngoscopy   Antibiotics   Anti-infectives (From admission, onward)   Start     Dose/Rate Route Frequency Ordered Stop   12/03/17 1800  clindamycin (CLEOCIN) IVPB 300 mg     300 mg 100 mL/hr over 30 Minutes Intravenous Every 6 hours 12/03/17 1458        Subjective:   She is happy to eat Jell-O she has no chest pain or shortness of breath  headache is improved tracheotomy was cleaned by respiratory and looks much more comfortable   Objective:   Vitals:   12/06/17 0419 12/06/17 0500 12/06/17 0800 12/06/17 0950  BP:   110/71   Pulse: (!) 107  (!) 105 (!) 105  Resp: 14  (!) 105 18  Temp:   98.6 F (37 C)   TempSrc:   Oral   SpO2: 95%  (!) 88% 98%  Weight:  136 kg (299 lb 13.2 oz)    Height:       No intake or output data in the 24 hours ending 12/06/17 1120 Filed Weights   12/04/17 0411 12/05/17 0431 12/06/17 0500  Weight: 133.5 kg (294 lb 5 oz) (!) 137.5 kg (303 lb 2.1 oz) 136 kg (299 lb 13.2 oz)   Exam  Obese pleasant trach in place-sutures in place some moisture around the trach no blood-looks clean otherwise Bad odor still emanating On 6-8 liters humidified No head deformity no submandibular lymphadenopathy very thick neck,  s1 s2 no m/r/g RRR Chest clear  abd soft nt nd  Neuro intact  Data Reviewed: I have personally reviewed following labs and imaging studies  CBC: Recent Labs  Lab 11/30/17 0640 12/02/17 1147 12/04/17 1058 12/05/17 0601 12/06/17 0758  WBC 18.5* 19.1*  16.7* 12.0* 15.0*  NEUTROABS  --   --  12.9* 8.9* 11.2*  HGB 13.5 15.1* 15.2* 13.4 13.7  HCT 43.5 46.6* 46.2* 41.7 41.8  MCV 92.4 91.2 89.7 89.5 88.9  PLT 403* 456* 491* 472* 878*   Basic Metabolic Panel: Recent Labs  Lab 11/30/17 0640 12/02/17 1147 12/04/17 1058 12/05/17 0601 12/06/17 0758  NA 139 139 137 137 135  K 4.7 4.0 3.9 3.6 3.5  CL 96* 96* 96* 98* 97*  CO2 _0 GLUCOSE 292* 243* 185* 110* 90  BUN 27* 35* 37* 38* 31*  CREATININE 0.84 1.02* 0.92 0.92 1.01*  CALCIUM 9.4 9.6 9.7 9.0 9.1  MG 2.1 2.1  --   --   --   PHOS  --   --   --   --  3.4   GFR: Estimated Creatinine Clearance: 95.8 mL/min (A) (by C-G formula based on SCr of 1.01 mg/dL (H)). Liver Function Tests: Recent Labs  Lab 12/02/17 1147 12/06/17 0758  AST 19  --   ALT 18  --   ALKPHOS 104  --   BILITOT 0.8  --   PROT 7.4  --   ALBUMIN 2.7* 2.5*   No results for input(s): LIPASE, AMYLASE in the last 168 hours. No results for input(s): AMMONIA in the last 168 hours. Coagulation Profile: No results for input(s): INR, PROTIME in the last 168 hours. Cardiac Enzymes: No results for input(s): CKTOTAL, CKMB, CKMBINDEX, TROPONINI in the last 168 hours.  BNP (last 3 results) No results for input(s): PROBNP in the last 8760 hours. HbA1C: No results for input(s): HGBA1C in the last 72 hours. CBG: Recent Labs  Lab 12/05/17 1621 12/05/17 1955 12/06/17 0025 12/06/17 0351 12/06/17 0811  GLUCAP 149* 138* 103* 95 93   Lipid Profile: No results for input(s): CHOL, HDL, LDLCALC, TRIG, CHOLHDL, LDLDIRECT in the last 72 hours. Thyroid Function Tests: No results for input(s): TSH, T4TOTAL, FREET4, T3FREE, THYROIDAB in the last 72 hours. Anemia Panel: No results for input(s): VITAMINB12, FOLATE, FERRITIN, TIBC, IRON, RETICCTPCT in the last 72 hours. Urine analysis:    Component Value Date/Time   COLORURINE RED (A) 10/31/2016 2130   APPEARANCEUR CLOUDY (A) 10/31/2016 2130   LABSPEC 1.027  10/31/2016 2130   PHURINE 6.0 10/31/2016 2130   GLUCOSEU >=500 (A) 10/31/2016 2130   HGBUR LARGE (A) 10/31/2016 2130   BILIRUBINUR NEGATIVE 10/31/2016 2130   [place] NEGATIVE 10/31/2016 2130   PROTEINUR 100 (A) 10/31/2016 2130   UROBILINOGEN 0.2 04/09/2014 1157   NITRITE NEGATIVE 10/31/2016 2130   LEUKOCYTESUR NEGATIVE 10/31/2016 2130   Sepsis Labs: _0 (procalcitonin:4,lacticidven:4)  ) Recent Results (from the past 240 hour(s))  MRSA PCR Screening     Status: None   Collection Time: 11/28/17 11:17 AM  Result Value Ref Range Status   MRSA by PCR NEGATIVE NEGATIVE Final    Comment:        The GeneXpert MRSA Assay (FDA approved for NASAL specimens only), is one component of a comprehensive MRSA colonization surveillance program. It is not intended to diagnose MRSA infection nor to guide or monitor treatment for MRSA infections. Performed at [facility], [place] [address] [place], [place] [address]   Culture, respiratory (NON-Expectorated)     Status: None   Collection Time: 11/30/17  4:28 AM  Result Value Ref Range Status   Specimen Description TRACHEAL ASPIRATE  Final   Special Requests NONE  Final   Gram Stain   Final    MODERATE WBC PRESENT, PREDOMINANTLY PMN FEW GRAM POSITIVE COCCI    Culture   Final    Consistent with normal respiratory flora. Performed at [facility], [place] [address] [place], [place] [address]    Report Status 12/02/2017 FINAL  Final      Radiology Studies: No results found.   Scheduled Meds: . buPROPion  100 mg Oral BID  . chlorhexidine  15 mL Mouth Rinse BID  . cholestyramine  4 g Oral QODAY  . enoxaparin (LOVENOX) injection  60 mg Subcutaneous Q24H  . gabapentin  300 mg Oral BID  . insulin aspart  0-20 Units Subcutaneous Q4H  . insulin glargine  38 Units Subcutaneous QHS  . [START ON 12/07/2017] levothyroxine  75 mcg Oral QAC breakfast  . mouth rinse  15 mL Mouth Rinse q12n4p  . metoprolol tartrate  12.5 mg  Oral BID  . pantoprazole (PROTONIX) IV  40 mg Intravenous BID  . protein supplement shake  11 oz Oral BID BM  . Racepinephrine HCl  0.5 mL Nebulization Once   Continuous Infusions: . clindamycin (CLEOCIN) IV 300 mg (12/06/17 0628)     LOS: 16 days   Time Spent in minutes   15 minutes  Verneita Griffes, MD [facility] [phone]

## 2017-12-06 NOTE — Care Management Note (Signed)
Case Management Note  Patient Details  Name: [name] MRN: [id number] Date of Birth: 1976-08-05  Subjective/Objective:                    Action/Plan: Plan is for [facility] when medically ready. CM following.   Expected Discharge Date:                  [facility]  In-House Referral:     Discharge planning Services  CM Consult  Post Acute Care Choice:    Choice offered to:     DME Arranged:    DME Agency:     HH Arranged:    [place] Agency:     Status of Service:  In process, will continue to follow  If discussed at Long Length of Stay Meetings, dates discussed:    Additional Comments:  Pollie Friar, RN 12/06/2017, 10:42 AM

## 2017-12-06 NOTE — Plan of Care (Signed)
  Problem: Spiritual Needs Goal: Ability to function at adequate level Outcome: Progressing   Problem: Education: Goal: Knowledge of General Education information will improve Outcome: Progressing

## 2017-12-07 ENCOUNTER — Inpatient Hospital Stay (HOSPITAL_COMMUNITY): Payer: Medicaid Other

## 2017-12-07 LAB — RENAL FUNCTION PANEL
ALBUMIN: 2.5 g/dL — AB (ref 3.5–5.0)
Anion gap: 14 (ref 5–15)
BUN: 36 mg/dL — AB (ref 6–20)
CALCIUM: 9 mg/dL (ref 8.9–10.3)
CO2: 22 mmol/L (ref 22–32)
Chloride: 94 mmol/L — ABNORMAL LOW (ref 101–111)
Creatinine, Ser: 1.55 mg/dL — ABNORMAL HIGH (ref 0.44–1.00)
GFR calc Af Amer: 46 mL/min — ABNORMAL LOW (ref >60)
GFR calc non Af Amer: 40 mL/min — ABNORMAL LOW (ref >60)
GLUCOSE: 200 mg/dL — AB (ref 65–99)
PHOSPHORUS: 4.1 mg/dL (ref 2.5–4.6)
Potassium: 5.6 mmol/L — ABNORMAL HIGH (ref 3.5–5.1)
SODIUM: 130 mmol/L — AB (ref 135–145)

## 2017-12-07 LAB — MAGNESIUM: Magnesium: 1.9 mg/dL (ref 1.7–2.4)

## 2017-12-07 LAB — BASIC METABOLIC PANEL
Anion gap: 14 (ref 5–15)
BUN: 36 mg/dL — AB (ref 6–20)
CALCIUM: 8.6 mg/dL — AB (ref 8.9–10.3)
CO2: 19 mmol/L — ABNORMAL LOW (ref 22–32)
Chloride: 93 mmol/L — ABNORMAL LOW (ref 101–111)
Creatinine, Ser: 1.56 mg/dL — ABNORMAL HIGH (ref 0.44–1.00)
GFR calc Af Amer: 46 mL/min — ABNORMAL LOW (ref >60)
GFR, EST NON AFRICAN AMERICAN: 40 mL/min — AB (ref >60)
GLUCOSE: 377 mg/dL — AB (ref 65–99)
Potassium: 5 mmol/L (ref 3.5–5.1)
Sodium: 126 mmol/L — ABNORMAL LOW (ref 135–145)

## 2017-12-07 LAB — GLUCOSE, CAPILLARY
GLUCOSE-CAPILLARY: 287 mg/dL — AB (ref 65–99)
GLUCOSE-CAPILLARY: 240 mg/dL — AB (ref 65–99)
GLUCOSE-CAPILLARY: 197 mg/dL — AB (ref 65–99)
Glucose-Capillary: 101 mg/dL — ABNORMAL HIGH (ref 65–99)
Glucose-Capillary: 335 mg/dL — ABNORMAL HIGH (ref 65–99)

## 2017-12-07 MED ORDER — ENOXAPARIN SODIUM 80 MG/0.8ML ~~LOC~~ SOLN
65.0000 mg | SUBCUTANEOUS | Status: DC
  Administered 2017-12-07 – 2017-12-08 (×2): 65 mg via SUBCUTANEOUS
  Filled 2017-12-07 (×2): qty 0.8

## 2017-12-07 MED ORDER — OXYCODONE-ACETAMINOPHEN 5-325 MG PO TABS
1.0000 | ORAL_TABLET | ORAL | Status: DC | PRN
  Administered 2017-12-08 – 2017-12-10 (×8): 2 via ORAL
  Administered 2017-12-10: 1 via ORAL
  Administered 2017-12-11 (×2): 2 via ORAL
  Administered 2017-12-11: 1 via ORAL
  Administered 2017-12-12 – 2018-01-05 (×20): 2 via ORAL
  Administered 2018-01-05 (×2): 1 via ORAL
  Administered 2018-01-06 (×2): 2 via ORAL
  Filled 2017-12-07: qty 2
  Filled 2017-12-07: qty 1
  Filled 2017-12-07 (×5): qty 2
  Filled 2017-12-07: qty 1
  Filled 2017-12-07 (×20): qty 2
  Filled 2017-12-07: qty 1
  Filled 2017-12-07 (×7): qty 2
  Filled 2017-12-07: qty 1

## 2017-12-07 MED ORDER — IBUPROFEN 200 MG PO TABS: 400.0000 mg | ORAL_TABLET | ORAL | Status: DC | PRN

## 2017-12-07 MED ORDER — SODIUM CHLORIDE 0.9 % IV SOLN
INTRAVENOUS | Status: DC
Start: 2017-12-07 — End: 2017-12-09
  Administered 2017-12-07: via INTRAVENOUS

## 2017-12-07 MED ORDER — KETOROLAC TROMETHAMINE 30 MG/ML IJ SOLN
30.0000 mg | Freq: Once | INTRAMUSCULAR | Status: AC
  Administered 2017-12-07: 30 mg via INTRAVENOUS
  Filled 2017-12-07: qty 1

## 2017-12-07 MED ORDER — INSULIN ASPART 100 UNIT/ML ~~LOC~~ SOLN
6.0000 [IU] | Freq: Three times a day (TID) | SUBCUTANEOUS | Status: DC
  Administered 2017-12-07 – 2017-12-16 (×28): 6 [IU] via SUBCUTANEOUS

## 2017-12-07 MED ORDER — FENTANYL CITRATE (PF) 100 MCG/2ML IJ SOLN
25.0000 ug | INTRAMUSCULAR | Status: DC | PRN
  Administered 2017-12-07 – 2017-12-08 (×3): 25 ug via INTRAVENOUS
  Filled 2017-12-07 (×4): qty 2

## 2017-12-07 MED ORDER — FENTANYL CITRATE (PF) 100 MCG/2ML IJ SOLN
50.0000 ug | Freq: Once | INTRAMUSCULAR | Status: AC
  Administered 2017-12-07: 50 ug via INTRAVENOUS

## 2017-12-07 MED ORDER — INSULIN ASPART 100 UNIT/ML ~~LOC~~ SOLN
0.0000 [IU] | Freq: Three times a day (TID) | SUBCUTANEOUS | Status: DC
  Administered 2017-12-07: 15 [IU] via SUBCUTANEOUS
  Administered 2017-12-07: 7 [IU] via SUBCUTANEOUS
  Administered 2017-12-08 (×3): 4 [IU] via SUBCUTANEOUS
  Administered 2017-12-09 (×2): 3 [IU] via SUBCUTANEOUS
  Administered 2017-12-09: 4 [IU] via SUBCUTANEOUS
  Administered 2017-12-10: 7 [IU] via SUBCUTANEOUS
  Administered 2017-12-10: 3 [IU] via SUBCUTANEOUS
  Administered 2017-12-11 – 2017-12-12 (×4): 4 [IU] via SUBCUTANEOUS
  Administered 2017-12-12: 7 [IU] via SUBCUTANEOUS
  Administered 2017-12-12 – 2017-12-13 (×3): 4 [IU] via SUBCUTANEOUS
  Administered 2017-12-13: 7 [IU] via SUBCUTANEOUS
  Administered 2017-12-14: 4 [IU] via SUBCUTANEOUS
  Administered 2017-12-14: 7 [IU] via SUBCUTANEOUS
  Administered 2017-12-14 – 2017-12-15 (×2): 4 [IU] via SUBCUTANEOUS
  Administered 2017-12-15: 11 [IU] via SUBCUTANEOUS
  Administered 2017-12-15: 7 [IU] via SUBCUTANEOUS
  Administered 2017-12-16 (×2): 4 [IU] via SUBCUTANEOUS
  Administered 2017-12-16 – 2017-12-17 (×2): 7 [IU] via SUBCUTANEOUS
  Administered 2017-12-17: 4 [IU] via SUBCUTANEOUS
  Administered 2017-12-17: 7 [IU] via SUBCUTANEOUS
  Administered 2017-12-18 – 2017-12-19 (×3): 4 [IU] via SUBCUTANEOUS
  Administered 2017-12-19: 7 [IU] via SUBCUTANEOUS
  Administered 2017-12-19: 4 [IU] via SUBCUTANEOUS
  Administered 2017-12-20: 3 [IU] via SUBCUTANEOUS
  Administered 2017-12-20: 4 [IU] via SUBCUTANEOUS
  Administered 2017-12-21 (×3): 3 [IU] via SUBCUTANEOUS
  Administered 2017-12-22 (×2): 4 [IU] via SUBCUTANEOUS
  Administered 2017-12-23: 11 [IU] via SUBCUTANEOUS
  Administered 2017-12-23 (×2): 7 [IU] via SUBCUTANEOUS
  Administered 2017-12-24: 3 [IU] via SUBCUTANEOUS
  Administered 2017-12-24 – 2017-12-25 (×2): 7 [IU] via SUBCUTANEOUS
  Administered 2017-12-26 (×2): 4 [IU] via SUBCUTANEOUS
  Administered 2017-12-26: 3 [IU] via SUBCUTANEOUS
  Administered 2017-12-27: 4 [IU] via SUBCUTANEOUS
  Administered 2017-12-27: 3 [IU] via SUBCUTANEOUS
  Administered 2017-12-27 – 2017-12-28 (×3): 4 [IU] via SUBCUTANEOUS
  Administered 2017-12-28: 7 [IU] via SUBCUTANEOUS
  Administered 2017-12-29 (×3): 4 [IU] via SUBCUTANEOUS
  Administered 2017-12-30: 7 [IU] via SUBCUTANEOUS
  Administered 2017-12-30 – 2017-12-31 (×3): 4 [IU] via SUBCUTANEOUS
  Administered 2017-12-31: 3 [IU] via SUBCUTANEOUS
  Administered 2017-12-31 – 2018-01-01 (×2): 4 [IU] via SUBCUTANEOUS
  Administered 2018-01-01: 3 [IU] via SUBCUTANEOUS
  Administered 2018-01-01: 4 [IU] via SUBCUTANEOUS
  Administered 2018-01-02: 3 [IU] via SUBCUTANEOUS
  Administered 2018-01-02: 4 [IU] via SUBCUTANEOUS
  Administered 2018-01-02: 11 [IU] via SUBCUTANEOUS
  Administered 2018-01-03: 3 [IU] via SUBCUTANEOUS
  Administered 2018-01-03: 4 [IU] via SUBCUTANEOUS
  Administered 2018-01-03: 3 [IU] via SUBCUTANEOUS
  Administered 2018-01-04: 4 [IU] via SUBCUTANEOUS
  Administered 2018-01-04 – 2018-01-05 (×2): 3 [IU] via SUBCUTANEOUS
  Administered 2018-01-05: 4 [IU] via SUBCUTANEOUS
  Administered 2018-01-06: 3 [IU] via SUBCUTANEOUS

## 2017-12-07 NOTE — Progress Notes (Addendum)
Pt noted to cough up small amount of blood tinged sputum, approximately 1TBSP.  RR regular, even, and unlabored at 24.  Pt denies any SOB or tachypnea.  O2 Saturations 90-94% on 60% FIO2 via trach collar.

## 2017-12-07 NOTE — Progress Notes (Signed)
Sutures removed that was holding trach in place.  Trach care done pt skin sore and red with some sores noted.  Protective barrier place on skin.  Reported to nurse about condition and recommending wound care to see patient.

## 2017-12-07 NOTE — Progress Notes (Signed)
ANTICOAGULATION CONSULT NOTE - Initial Consult  Pharmacy Consult for Lovenox for Indication: VTE prophylaxis  Allergies  Allergen Reactions  . Penicillins Hives    Has patient had a PCN reaction causing immediate rash, facial/tongue/throat swelling, SOB or lightheadedness with hypotension: Yes Has patient had a PCN reaction causing severe rash involving mucus membranes or skin necrosis: Yes Has patient had a PCN reaction that required hospitalization No Has patient had a PCN reaction occurring within the last 10 years: No If all of the above answers are "NO", then may proceed with Cephalosporin use.     Patient Measurements: Height: 5\' 2"  (157.5 cm) Weight: (!) 302 lb 0.5 oz (137 kg) IBW/kg (Calculated) : 50.1   Vital Signs: Temp: 98.2 F (36.8 C) (03/30 1211) Temp Source: Oral (03/30 1211) BP: 104/84 (03/30 1211) Pulse Rate: 83 (03/30 1221)  Labs: Recent Labs    12/05/17 0601 12/06/17 0758 12/07/17 0617 12/07/17 0826  HGB 13.4 13.7  --   --   HCT 41.7 41.8  --   --   PLT 472* 570*  --   --   CREATININE 0.92 1.01* 1.55* 1.56*    Estimated Creatinine Clearance: 62.3 mL/min (A) (by C-G formula based on SCr of 1.56 mg/dL (H)).   Medical History: Past Medical History:  Diagnosis Date  . Anxiety   . Complication of anesthesia    Pt. states takes long time to wake up from it.   . Cushing's disease ([place])   . Depression   . Diabetes ([place])   . Hyperlipidemia   . Hyperlipidemia   . Hypertension   . Morbid obesity ([place])   . Osteoporosis 07/19/2015  . Periprosthetic fracture 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[place]), R tibial plateau  07/18/2015  . Sleep apnea   . Uncontrolled diabetes mellitus with diabetic neuropathy, with long-term current use of insulin ([place]) 07/16/2015  . Vitamin D deficiency 07/19/2015    Medications:  Medications Prior to Admission  Medication Sig Dispense Refill Last Dose  . atorvastatin (LIPITOR) 20 MG tablet Take 20 mg by mouth at bedtime.    Past Week at Unknown time  . baclofen (LIORESAL) 10 MG tablet Take 10 mg by mouth 3 (three) times daily as needed.    11/19/2017 at Unknown time  . buPROPion (WELLBUTRIN SR) 100 MG 12 hr tablet Take 100 mg by mouth 2 (two) times daily.   Past Week at Unknown time  . gabapentin (NEURONTIN) 300 MG capsule Take 600 mg by mouth 2 (two) times daily.    Past Month at Unknown time  . hydrOXYzine (VISTARIL) 50 MG capsule Take 50 mg by mouth at bedtime.   Past Week at Unknown time  . insulin NPH-regular Human (NOVOLIN 70/30) (70-30) 100 UNIT/ML injection Inject 28 Units into the skin 2 (two) times daily with a meal. (Patient taking differently: Inject 50 Units into the skin at bedtime. ) 10 mL 11 Past Week at Unknown time  . insulin NPH-regular Human (NOVOLIN 70/30) (70-30) 100 UNIT/ML injection Inject 28 Units into the skin at bedtime.   11/18/2017 at Unknown time  . levothyroxine (SYNTHROID, LEVOTHROID) 125 MCG tablet Take 125 mcg by mouth daily before breakfast.   Past Week at Unknown time  . metFORMIN (GLUCOPHAGE) 1000 MG tablet Take 1,000 mg by mouth 2 (two) times daily with a meal.   Past Week at Unknown time  . norethindrone (MICRONOR,CAMILA,ERRIN) 0.35 MG tablet Take 1 tablet by mouth daily.   Past Week at Unknown time  .  ondansetron (ZOFRAN ODT) 4 MG disintegrating tablet Take 1 tablet (4 mg total) by mouth every 8 (eight) hours as needed for nausea or vomiting. 20 tablet 0 Past Month at Unknown time  . pioglitazone (ACTOS) 15 MG tablet Take 15 mg by mouth daily.   Past Week at Unknown time  . quinapril (ACCUPRIL) 5 MG tablet Take 5 mg by mouth every morning.    Past Week at Unknown time  . traMADol (ULTRAM) 50 MG tablet Take 1 tablet (50 mg total) by mouth every 6 (six) hours as needed. 10 tablet 0 11/19/2017 at Unknown time  . traZODone (DESYREL) 50 MG tablet Take 50 mg by mouth at bedtime.   Past Week at Unknown time  . doxycycline (VIBRAMYCIN) 100 MG capsule Take 1 capsule (100 mg total) by mouth 2  (two) times daily. 10 capsule 0 Taking  . levothyroxine (SYNTHROID, LEVOTHROID) 75 MCG tablet Take 1 tablet (75 mcg total) by mouth every morning.   Taking  . metoCLOPramide (REGLAN) 10 MG tablet TAKE ONE TABLET BY MOUTH AT BEDTIME 30 tablet 4 Taking  . naproxen (NAPROSYN) 500 MG tablet Take 1 tablet (500 mg total) by mouth 2 (two) times daily. 30 tablet 0 Taking  . pantoprazole (PROTONIX) 40 MG tablet TAKE ONE TABLET BY MOUTH TWICE A DAY BEFORE MEALS 60 tablet 0 Taking  . sucralfate (CARAFATE) 1 g tablet Take 1 tablet (1 g total) by mouth 4 (four) times daily -  with meals and at bedtime. 30 tablet 0 Taking   Scheduled:  . buPROPion  100 mg Oral BID  . chlorhexidine  15 mL Mouth Rinse BID  . cholestyramine  4 g Oral QODAY  . gabapentin  300 mg Oral BID  . insulin aspart  0-20 Units Subcutaneous TID WC  . insulin aspart  6 Units Subcutaneous TID WC  . insulin glargine  38 Units Subcutaneous QHS  . levothyroxine  75 mcg Oral QAC breakfast  . mouth rinse  15 mL Mouth Rinse q12n4p  . metoprolol tartrate  12.5 mg Oral BID  . pantoprazole (PROTONIX) IV  40 mg Intravenous BID  . protein supplement shake  11 oz Oral BID BM  . Racepinephrine HCl  0.5 mL Nebulization Once    Assessment: 42 y.o female, pharmacy requested to dose Lovenox for VTE prophylaxis.  Previously this admission she was receiving Lovenox 0.5mg /kg = 60mg  SQ q24 hour for VTE prophylaxis due to BMI >30. ( BMI = 55).  Wt currently 137kg. CrCl > 30 ml/min  S/p tracheostomy 11/28/17.  Lovenox dose was stopped last night/early AM due to bleeding.  Dr. Verlon Au now resuming Lovenox, noting that events noted overnight with 1 tablespoon of blood after coughing. Tracheotomy site however not grossly bleeding  SCR 1.56 increased from 1.01. CrCl ~ 62 ml/min.   Goal of Therapy:  Monitor platelets by anticoagulation protocol: Yes   Plan:  Resume Lovenox 0.5mg /kg SQ q24h = 65 mg SQ q24h  CBC q72h ,  Monitor Scr for renal function  (renal  panel pending in AM)  Monitor for s/s of bleeding   Nicole Cella, RPh Clinical Pharmacist Pager: [phone] 12/07/2017,12:48 PM

## 2017-12-07 NOTE — Progress Notes (Signed)
PROGRESS NOTE    [name]  RCV:[id number] DOB: 1976-07-21 DOA: 11/20/2017 PCP: Sandi Mariscal, MD   Brief Narrative:  41-year- Body mass index is 55.24 kg/m.Cushing's disease, diabetes on insulin, depression, hypertension in a setting pfo priopor pre-eclmapsia when previously pregnant, OSA ? On cvpap-prior TIb plateau # 07/2015, R side knee arthroplasty Admitted on 3/13 to ICU for intentional drug overdose of baclofen, trazodone and tramadol.   Was encephalopathic and had generalized seizure in ER therefore patient was intubated and admitted by pulmonary critical care.  Patient was evaluated by psychiatrist who recommended inpatient psych on discharge.  Started on diuretics for pulmonary congestion.  Transferred to [facility] on 11/24/2017.  Patient developed stridor and was seen by ENT, found to have irregular inflammatory vocal cord changes. ENT recommended medrol dose pack and PPI.  Patient developed increasing stridor. Rapid response was consulted, patient given racemic epi.   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 S/p tracheostomy.    Pyschiatry reevaluated patient and still recommending inpatient psychiatric admission.   Assessment & Plan   Acute respiratory failure secondary to stridor/Glottic obstruction  -ENT, Dr. Redmond Baseman, consulted and appreciated- s/p transnasal fiberoptic laryngoscopy: posterior vocal folds have irregular inflammatory changes at the vocal processes. -patient was given 2 doses of IV decadron -As above, developed increasing stridor.  ENT reevaluated patient with bedside laryngoscopy 11/26/17 found posterior supraglottic swelling.  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   -s/p tracheostomy 11/28/17. -Had some superficial bleeding from tracheotomy and discussed on 3/27- discussed with Dr. Constance Holster 3/27 --patient has had some further minimal  bleeding (1 tablespoon) from tracheotomy 3/29 and drop in sats to 85-88% but at bedside currently satting fairly well FiO2 35% -Worsening odor from [place] 3/26 : Discussed with the ENT Team, and IV Clindamycin was advised.  -Would not use any further Solu-Medrol at this stage -SLP did perform swallow eval and upgraded to dysphagia 3 diet which she is tolerating well and drinking lots of juice -Stay sutures from tracheotomy to be removed 3/30 -Pain control adjusted discontinue Toradol, use Percocet for pain and cut back fentanyl from 50-25 mcg every 2 as needed  Intentional overdose with suicidal ideation -required intubation and is now extubated -psychiatry consulted and appreciated,  -psych reevaluated patient on 3/22- still recommended inpatient psych admission -Resuming Wellbutrin by mouth every 12 100 mg resumed gabapentin lower dose 300 twice daily  -holding Vistaril for now -resume trazodone 50 at bedtime   Acute kidney injury  Hyponatremia Recurrent hyperkalemia -BUN/creatinine on admission 19/0.8 currently 35/1.0-->38/0.9 so starting IVF 50 cc/h 3/30-- monitoring creatinine in a.m. -Discontinue Toradol 3/30 -Patient resumed diet 3/29 so think may be has taken excessive fluids and wash down her sodium from 130-126 -Will not fluid restrict at that time but will replace with saline -Hemolyzed sample on 3/30 repeat showed potassium of 5 just observe and repeat in a.m.  Diabetes mellitus, type II with hyperglycemia -continue Lantus 38 units at bedtime home dose 28 70/30 twice daily -Holding metformin 1000 twice daily, Actos 15 daily  -CBG monitoring- changed to 4 times daily before meals at bedtime 3/30-with resistant coverage  -CBGs ranging 101-377 and patient is noncompliant and drinking apple juice  Hypomagnesemia -resolved with replacement and last check was 2.1--recheck 1.93/30   Seizure disorder -currently stable  Anxiety/depression -needs inpatient psych  admission -medications as above- currently held  Hypothyroidism -transitioned from IV 37.5-75 milligrams orally  Acute toxic  encephalopathy -with inability to protect airway/in the setting of intentional overdose requiring intubation -drug screen negative -CT head unremarkable  -Improved, appears to be at baseline  Acute pulmonary edema -Improved, was on IV lasix -CXR on 3/21 reviewed and unremarkable for infection or edema  Shortness of breath and chest pain suspect anxiety component -SOB resolved with trach placement -Workup at time chest x-ray, EKG, troponin, d-dimer unremarkable  Hypotension, sinus tachycardia -Resolved, patient has a history of hypertension -Transition 3/29 to metoprolol 12.5 twice daily and titrate to goal   DVT Prophylaxis  lovenox Code Status: Full Family Communication: Met with husband on 3/28 and 3/29-no family present today Disposition Plan: Inpatient psychiatry.  Consultants PCCM Psychiatry  ENT  Procedures  Intubation/extubation Renal US transnasal fiberoptic laryngoscopy x 2 Tracheostomy and direct laryngoscopy   Antibiotics   Anti-infectives (From admission, onward)   Start     Dose/Rate Route Frequency Ordered Stop   12/03/17 1800  clindamycin (CLEOCIN) IVPB 300 mg     300 mg 100 mL/hr over 30 Minutes Intravenous Every 6 hours 12/03/17 1458        Subjective:   Awake alert a little sleepy mouth that she has some discomfort in her legs and swelling Events noted overnight with 1 tablespoon of blood after coughing Tracheotomy site however not grossly bleeding No chest pain no vomiting does not state headaches are significant   Objective:   Vitals:   12/07/17 0500 12/07/17 0644 12/07/17 0823 12/07/17 0859  BP:   (!) 116/59   Pulse:  72    Resp:  20 (!) 21   Temp:   (!) 97.4 F (36.3 C)   TempSrc:   Oral   SpO2:  99%  99%  Weight: (!) 137 kg (302 lb 0.5 oz)     Height:        Intake/Output Summary (Last 24 hours)  at 12/07/2017 0930 Last data filed at 12/07/2017 0825 Gross per 24 hour  Intake 720 ml  Output -  Net 720 ml   Filed Weights   12/05/17 0431 12/06/17 0500 12/07/17 0500  Weight: (!) 137.5 kg (303 lb 2.1 oz) 136 kg (299 lb 13.2 oz) (!) 137 kg (302 lb 0.5 oz)   Exam  Obese pleasant trach in place-sutures still in place moist area around trach site Bad odor still emanating from the same Cut back FiO2 to 35% and saturating well No head deformity no submandibular lymphadenopathy very thick neck,  s1 s2 no m/r/g RRR Chest clear  abd soft nt nd  Neuro intact Legs are swollen bilaterally  Data Reviewed: I have personally reviewed following labs and imaging studies  CBC: Recent Labs  Lab 12/02/17 1147 12/04/17 1058 12/05/17 0601 12/06/17 0758  WBC 19.1* 16.7* 12.0* 15.0*  NEUTROABS  --  12.9* 8.9* 11.2*  HGB 15.1* 15.2* 13.4 13.7  HCT 46.6* 46.2* 41.7 41.8  MCV 91.2 89.7 89.5 88.9  PLT 456* 491* 472* 366*   Basic Metabolic Panel: Recent Labs  Lab 12/02/17 1147 12/04/17 1058 12/05/17 0601 12/06/17 0758 12/07/17 0617 12/07/17 0826  NA 139 137 137 135 130* 126*  K 4.0 3.9 3.6 3.5 5.6* 5.0  CL 96* 96* 98* 97* 94* 93*  CO2 27 25 25 24 22  19*  GLUCOSE 243* 185* 110* 90 200* 377*  BUN 35* 37* 38* 31* 36* 36*  CREATININE 1.02* 0.92 0.92 1.01* 1.55* 1.56*  CALCIUM 9.6 9.7 9.0 9.1 9.0 8.6*  MG 2.1  --   --   --  1.9  --   PHOS  --   --   --  3.4 4.1  --    GFR: Estimated Creatinine Clearance: 62.3 mL/min (A) (by C-G formula based on SCr of 1.56 mg/dL (H)). Liver Function Tests: Recent Labs  Lab 12/02/17 1147 12/06/17 0758 12/07/17 0617  AST 19  --   --   ALT 18  --   --   ALKPHOS 104  --   --   BILITOT 0.8  --   --   PROT 7.4  --   --   ALBUMIN 2.7* 2.5* 2.5*   No results for input(s): LIPASE, AMYLASE in the last 168 hours. No results for input(s): AMMONIA in the last 168 hours. Coagulation Profile: No results for input(s): INR, PROTIME in the last 168  hours. Cardiac Enzymes: No results for input(s): CKTOTAL, CKMB, CKMBINDEX, TROPONINI in the last 168 hours. BNP (last 3 results) No results for input(s): PROBNP in the last 8760 hours. HbA1C: No results for input(s): HGBA1C in the last 72 hours. CBG: Recent Labs  Lab 12/06/17 1622 12/06/17 1952 12/06/17 2352 12/07/17 0400 12/07/17 0742  GLUCAP 292* 265* 78 101* 287*   Lipid Profile: No results for input(s): CHOL, HDL, LDLCALC, TRIG, CHOLHDL, LDLDIRECT in the last 72 hours. Thyroid Function Tests: No results for input(s): TSH, T4TOTAL, FREET4, T3FREE, THYROIDAB in the last 72 hours. Anemia Panel: No results for input(s): VITAMINB12, FOLATE, FERRITIN, TIBC, IRON, RETICCTPCT in the last 72 hours. Urine analysis:    Component Value Date/Time   COLORURINE RED (A) 10/31/2016 2130   APPEARANCEUR CLOUDY (A) 10/31/2016 2130   LABSPEC 1.027 10/31/2016 2130   PHURINE 6.0 10/31/2016 2130   GLUCOSEU >=500 (A) 10/31/2016 2130   HGBUR LARGE (A) 10/31/2016 2130   BILIRUBINUR NEGATIVE 10/31/2016 2130   KETONESUR NEGATIVE 10/31/2016 2130   PROTEINUR 100 (A) 10/31/2016 2130   UROBILINOGEN 0.2 04/09/2014 1157   NITRITE NEGATIVE 10/31/2016 2130   LEUKOCYTESUR NEGATIVE 10/31/2016 2130   Sepsis Labs: @LABRCNTIP (procalcitonin:4,lacticidven:4)  ) Recent Results (from the past 240 hour(s))  MRSA PCR Screening     Status: None   Collection Time: 11/28/17 11:17 AM  Result Value Ref Range Status   MRSA by PCR NEGATIVE NEGATIVE Final    Comment:        The GeneXpert MRSA Assay (FDA approved for NASAL specimens only), is one component of a comprehensive MRSA colonization surveillance program. It is not intended to diagnose MRSA infection nor to guide or monitor treatment for MRSA infections. Performed at [facility], [place] [address] [place], [place] [address]   Culture, respiratory (NON-Expectorated)     Status: None   Collection Time: 11/30/17  4:28 AM  Result Value Ref Range  Status   Specimen Description TRACHEAL ASPIRATE  Final   Special Requests NONE  Final   Gram Stain   Final    MODERATE WBC PRESENT, PREDOMINANTLY PMN FEW GRAM POSITIVE COCCI    Culture   Final    Consistent with normal respiratory flora. Performed at [facility], [place] [address], [place], [place] [address]    Report Status 12/02/2017 FINAL  Final  Culture, expectorated sputum-assessment     Status: None (Preliminary result)   Collection Time: 12/07/17  5:18 AM  Result Value Ref Range Status   Specimen Description EXPECTORATED SPUTUM  Final   Special Requests NONE  Final   Sputum evaluation   Final    THIS SPECIMEN IS ACCEPTABLE FOR SPUTUM CULTURE  Performed at [facility], [place] [address] [place], [place] [address]    Report Status PENDING  Incomplete  Culture, respiratory (NON-Expectorated)     Status: None (Preliminary result)   Collection Time: 12/07/17  5:18 AM  Result Value Ref Range Status   Specimen Description EXPECTORATED SPUTUM  Final   Special Requests NONE Reflexed from [id number]  Final   Gram Stain   Final    FEW WBC PRESENT, PREDOMINANTLY PMN FEW GRAM POSITIVE COCCI RARE GRAM NEGATIVE RODS Performed at [facility], [place] [address], [place], [place] [address]    Culture PENDING  Incomplete   Report Status PENDING  Incomplete      Radiology Studies: Dg Chest Port 1 View  Result Date: 12/07/2017 CLINICAL DATA:  Shortness of breath, cough and congestion. EXAM: PORTABLE CHEST 1 VIEW COMPARISON:  11/28/2017 FINDINGS: Tracheostomy tube in stable position. The cardiac silhouette is mildly enlarged. Mediastinal contours appear intact. There is no evidence of pleural effusion or pneumothorax. Mild peribronchial airspace consolidation in bilateral lower lobes. Osseous structures are without acute abnormality. Soft tissues are grossly normal. IMPRESSION: Mild peribronchial airspace consolidation in bilateral lower lobes may represent acute bronchitis or early  developing bronchopneumonia. Electronically Signed   By: Fidela Salisbury M.D.   On: 12/07/2017 00:30   Dg Swallowing Func-speech Pathology  Result Date: 12/06/2017 Objective Swallowing Evaluation: Type of Study: MBS-Modified Barium Swallow Study  Patient Details Name: [name] MRN: [id number] Date of Birth: 02-10-1976 Today's Date: 12/06/2017 Time: SLP Start Time (ACUTE ONLY): 1200 -SLP Stop Time (ACUTE ONLY): 1223 SLP Time Calculation (min) (ACUTE ONLY): 23 min Past Medical History: Past Medical History: Diagnosis Date . Anxiety  . Complication of anesthesia   Pt. states takes long time to wake up from it.  . Cushing's disease ([place])  . Depression  . Diabetes ([place])  . Hyperlipidemia  . Hyperlipidemia  . Hypertension  . Morbid obesity ([place])  . Osteoporosis 07/19/2015 . Periprosthetic fracture 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[place]), R tibial plateau  07/18/2015 . Sleep apnea  . Uncontrolled diabetes mellitus with diabetic neuropathy, with long-term current use of insulin ([place]) 07/16/2015 . Vitamin D deficiency 07/19/2015 Past Surgical History: Past Surgical History: Procedure Laterality Date . ANTERIOR TALOFIBULAR LIGAMENT REPAIR Left 11/15/2014  Procedure: ANTERIOR TALOFIBULAR LIGAMENT REPAIR;  Surgeon: Jana Half, DPM;  Location: [facility];  Service: Podiatry;  Laterality: Left; . CESAREAN SECTION  dec 1997/  06-03-2001/   01-01-2005  BILATERAL TUBAL LIGATION WITH LAST ONE . DILATION AND CURETTAGE OF UTERUS  1995  WITH SUCTION . ESOPHAGOGASTRODUODENOSCOPY (EGD) WITH PROPOFOL N/A 10/04/2016  Procedure: ESOPHAGOGASTRODUODENOSCOPY (EGD) WITH PROPOFOL;  Surgeon: [name], MD;  Location: [facility];  Service: Endoscopy;  Laterality: N/A; . LAPAROSCOPIC CHOLECYSTECTOMY  09-25-2005 . ORIF TIBIA PLATEAU Right 07/19/2015  Procedure: OPEN REDUCTION INTERNAL FIXATION (ORIF) RIGHT TIBIAL PLATEAU;  Surgeon: Altamese [place], MD;  Location: [facility];  Service: Orthopedics;  Laterality: Right; . PARTIAL  KNEE ARTHROPLASTY Right 04/19/2014  Procedure: RIGHT UNI KNEE ARTHROPLASTY MEDIALLY ;  Surgeon: Mauri Pole, MD;  Location: [facility];  Service: Orthopedics;  Laterality: Right; . TRACHEOSTOMY TUBE PLACEMENT N/A 11/28/2017  Procedure: TRACHEOSTOMY;  Surgeon: Izora Gala, MD;  Location: [facility];  Service: ENT;  Laterality: N/A; . TUBAL LIGATION   HPI: Patient is a 42 y/o female who presents with drug OD and Sz. Intubated 3/13-3/15. resp failure with emergent trach 3/21.  ENT flexible laryngoscopy revealed posterior supraglottic swelling; swollen  vocal folds with no abduction; edematous arytenoids; entire subglottis circumferentially swollen with necrotic debris and fibrinous exudate.  Subjective: alert Assessment / Plan / Recommendation CHL IP CLINICAL IMPRESSIONS 12/06/2017 Clinical Impression Pt presents with improved pharyngeal swallow.  Edema persists, but does not impede bolus passage through UES nor prevent airway protection.  Purees and mechanical solids pass easily into esophagus with no residue post-swallow.  Thin liquids were observed to consistently penetrate under the epiglottis, but they did not reach the vocal folds and were not aspirated.  Use of PMV did not impact the swallow.  Recommend pt begin a dysphagia 3 diet with thin liquids; crush meds in puree.  Discussed results/recs; pt engaged, smiling, and asking for jello.  SLP will follow for PO toleration/safety.   SLP Visit Diagnosis Dysphagia, pharyngeal phase (R13.13);Aphonia (R49.1) Attention and concentration deficit following -- Frontal lobe and executive function deficit following -- Impact on safety and function Mild aspiration risk   CHL IP TREATMENT RECOMMENDATION 12/06/2017 Treatment Recommendations Therapy as outlined in treatment plan below   Prognosis 12/02/2017 Prognosis for Safe Diet Advancement Good Barriers to Reach Goals -- Barriers/Prognosis Comment -- CHL IP DIET RECOMMENDATION 12/06/2017 SLP Diet Recommendations Dysphagia 3 (Mech soft)  solids;Thin liquid Liquid Administration via Cup;Straw Medication Administration Crushed with puree Compensations Small sips/bites Postural Changes Remain semi-upright after after feeds/meals (Comment)   CHL IP OTHER RECOMMENDATIONS 12/06/2017 Recommended Consults -- Oral Care Recommendations Oral care BID Other Recommendations --   No flowsheet data found.  CHL IP FREQUENCY AND DURATION 12/06/2017 Speech Therapy Frequency (ACUTE ONLY) min 3x week Treatment Duration 1 week      CHL IP ORAL PHASE 12/06/2017 Oral Phase WFL Oral - Pudding Teaspoon -- Oral - Pudding Cup -- Oral - Honey Teaspoon -- Oral - Honey Cup -- Oral - Nectar Teaspoon -- Oral - Nectar Cup -- Oral - Nectar Straw -- Oral - Thin Teaspoon -- Oral - Thin Cup -- Oral - Thin Straw -- Oral - Puree -- Oral - Mech Soft -- Oral - Regular -- Oral - Multi-Consistency -- Oral - Pill -- Oral Phase - Comment --  CHL IP PHARYNGEAL PHASE 12/06/2017 Pharyngeal Phase Impaired Pharyngeal- Pudding Teaspoon -- Pharyngeal -- Pharyngeal- Pudding Cup -- Pharyngeal -- Pharyngeal- Honey Teaspoon NT Pharyngeal -- Pharyngeal- Honey Cup -- Pharyngeal -- Pharyngeal- Nectar Teaspoon Penetration/Aspiration before swallow Pharyngeal Material enters airway, remains ABOVE vocal cords then ejected out Pharyngeal- Nectar Cup -- Pharyngeal -- Pharyngeal- Nectar Straw -- Pharyngeal -- Pharyngeal- Thin Teaspoon NT Pharyngeal -- Pharyngeal- Thin Cup Penetration/Aspiration before swallow Pharyngeal Material enters airway, remains ABOVE vocal cords and not ejected out Pharyngeal- Thin Straw Penetration/Aspiration before swallow Pharyngeal Material enters airway, remains ABOVE vocal cords and not ejected out Pharyngeal- Puree WFL Pharyngeal -- Pharyngeal- Mechanical Soft -- Pharyngeal -- Pharyngeal- Regular -- Pharyngeal -- Pharyngeal- Multi-consistency -- Pharyngeal -- Pharyngeal- Pill -- Pharyngeal -- Pharyngeal Comment --  No flowsheet data found. No flowsheet data found. Juan Quam  Laurice 12/06/2017, 12:30 PM                Scheduled Meds: . buPROPion  100 mg Oral BID  . chlorhexidine  15 mL Mouth Rinse BID  . cholestyramine  4 g Oral QODAY  . gabapentin  300 mg Oral BID  . insulin aspart  0-20 Units Subcutaneous Q4H  . insulin glargine  38 Units Subcutaneous QHS  . levothyroxine  75 mcg Oral QAC breakfast  . mouth rinse  15 mL Mouth Rinse q12n4p  .  metoprolol tartrate  12.5 mg Oral BID  . pantoprazole (PROTONIX) IV  40 mg Intravenous BID  . protein supplement shake  11 oz Oral BID BM  . Racepinephrine HCl  0.5 mL Nebulization Once   Continuous Infusions: . clindamycin (CLEOCIN) IV Stopped (12/07/17 0616)     LOS: 17 days   Time Spent in minutes   15 minutes  Verneita Griffes, MD [facility] [phone]

## 2017-12-07 NOTE — Progress Notes (Signed)
Pt requiring titration to 60% FIO2 via trach collar.  RR unlabored but pt c/o "feeling like fluid."  X. Blount, NP notified.  Order received for IV solumedrol.  Solumedrol given per order.

## 2017-12-07 NOTE — Progress Notes (Signed)
Pt noted to continue coughing up blood, darker in color than previous episode of hemoptysis.  FIO2 increased to 70% via trach collar due to patient maintaining oxygen saturation of 85-88%.  [name] Corpus, NP notified.  Order received for sputum culture and hold lovenox for frank bleeding.

## 2017-12-07 NOTE — Progress Notes (Addendum)
Pt FIO2 increased to 50% trach collar.  Pt still noted to have desaturations to 85-92%.  Pt continuously coughing states "It feels like fluid."  X. Blount, NP notified.  Awaiting return call.    12/06/17 2330  Vitals  BP (!) 109/56  BP Location Left Arm  BP Method Automatic  Patient Position (if appropriate) Sitting  Pulse Rate 82  Pulse Rate Source Monitor  Oxygen Therapy  SpO2 91 %  O2 Device Tracheostomy Collar  O2 Flow Rate (L/min) 10 L/min  FiO2 (%) 50 %

## 2017-12-07 NOTE — Progress Notes (Signed)
Pt noted to have oxygen desaturations to 91% on 35% FIO2.  RT at bedside and MD notified.  Pt states "It feels like fluid." X. Blount, NP notified.  Stated to give PRN neb treatment first and reevaluate.

## 2017-12-08 LAB — GLUCOSE, CAPILLARY
GLUCOSE-CAPILLARY: 152 mg/dL — AB (ref 65–99)
GLUCOSE-CAPILLARY: 192 mg/dL — AB (ref 65–99)
GLUCOSE-CAPILLARY: 166 mg/dL — AB (ref 65–99)
Glucose-Capillary: 181 mg/dL — ABNORMAL HIGH (ref 65–99)

## 2017-12-08 LAB — CBC WITH DIFFERENTIAL/PLATELET
BASOS PCT: 0 %
Basophils Absolute: 0 10*3/uL (ref 0.0–0.1)
EOS ABS: 0.1 10*3/uL (ref 0.0–0.7)
EOS PCT: 1 %
HEMATOCRIT: 37 % (ref 36.0–46.0)
Hemoglobin: 12 g/dL (ref 12.0–15.0)
Lymphocytes Relative: 20 %
Lymphs Abs: 2.3 10*3/uL (ref 0.7–4.0)
MCH: 28.5 pg (ref 26.0–34.0)
MCHC: 32.4 g/dL (ref 30.0–36.0)
MCV: 87.9 fL (ref 78.0–100.0)
MONO ABS: 0.8 10*3/uL (ref 0.1–1.0)
MONOS PCT: 7 %
Neutro Abs: 8.6 10*3/uL — ABNORMAL HIGH (ref 1.7–7.7)
Neutrophils Relative %: 72 %
PLATELETS: 512 10*3/uL — AB (ref 150–400)
RBC: 4.21 MIL/uL (ref 3.87–5.11)
RDW: 12.7 % (ref 11.5–15.5)
WBC: 11.9 10*3/uL — ABNORMAL HIGH (ref 4.0–10.5)

## 2017-12-08 LAB — RENAL FUNCTION PANEL
ALBUMIN: 2.4 g/dL — AB (ref 3.5–5.0)
Anion gap: 12 (ref 5–15)
BUN: 33 mg/dL — AB (ref 6–20)
CO2: 22 mmol/L (ref 22–32)
CREATININE: 1.31 mg/dL — AB (ref 0.44–1.00)
Calcium: 8.8 mg/dL — ABNORMAL LOW (ref 8.9–10.3)
Chloride: 96 mmol/L — ABNORMAL LOW (ref 101–111)
GFR calc Af Amer: 57 mL/min — ABNORMAL LOW (ref >60)
GFR calc non Af Amer: 49 mL/min — ABNORMAL LOW (ref >60)
GLUCOSE: 126 mg/dL — AB (ref 65–99)
PHOSPHORUS: 3.8 mg/dL (ref 2.5–4.6)
Potassium: 4 mmol/L (ref 3.5–5.1)
SODIUM: 130 mmol/L — AB (ref 135–145)

## 2017-12-08 LAB — MAGNESIUM: Magnesium: 1.8 mg/dL (ref 1.7–2.4)

## 2017-12-08 MED ORDER — FENTANYL CITRATE (PF) 100 MCG/2ML IJ SOLN
25.0000 ug | INTRAMUSCULAR | Status: DC | PRN
  Administered 2017-12-08 – 2018-01-06 (×9): 25 ug via INTRAVENOUS
  Filled 2017-12-08 (×10): qty 2

## 2017-12-08 MED ORDER — MAGNESIUM SULFATE 2 GM/50ML IV SOLN
2.0000 g | Freq: Once | INTRAVENOUS | Status: AC
  Administered 2017-12-08: 2 g via INTRAVENOUS
  Filled 2017-12-08: qty 50

## 2017-12-08 MED ORDER — PANTOPRAZOLE SODIUM 40 MG PO TBEC
40.0000 mg | DELAYED_RELEASE_TABLET | Freq: Two times a day (BID) | ORAL | Status: DC
  Administered 2017-12-08 – 2018-01-07 (×59): 40 mg via ORAL
  Filled 2017-12-08 (×59): qty 1

## 2017-12-08 NOTE — Progress Notes (Signed)
The patient is receiving Protonix by the intravenous route.  Based on criteria approved by the Pharmacy and [facility], the medication is being converted to the equivalent oral dose form.  These criteria include: -No active GI bleeding -Able to tolerate diet of full liquids (or better) or tube feeding -Able to tolerate other medications by the oral or enteral route  If you have any questions about this conversion, please contact the Pharmacy Department (phone [phone]).  Thank you.  Nicole Cella, RPh Clinical Pharmacist Pager: [phone] 12/08/2017 1:21 PM

## 2017-12-08 NOTE — Plan of Care (Signed)
  Problem: Pain Managment: Goal: General experience of comfort will improve Outcome: Progressing   

## 2017-12-08 NOTE — Progress Notes (Signed)
PROGRESS NOTE    [name]  ZJQ:[id number] DOB: [birth year] DOA: 11/20/2017 PCP: Sandi Mariscal, MD   Brief Narrative:  42-year- Body mass index is 60.04 kg/m.Cushing's disease, diabetes on insulin, depression, hypertension in a setting pfo priopor pre-eclmapsia when previously pregnant, OSA ? On cvpap-prior TIb plateau # 07/2015, R side knee arthroplasty Admitted on 3/13 to ICU for intentional drug overdose of baclofen, trazodone and tramadol.   Was encephalopathic and had generalized seizure in ER therefore patient was intubated and admitted by pulmonary critical care.  Patient was evaluated by psychiatrist who recommended inpatient psych on discharge.  Started on diuretics for pulmonary congestion.  Transferred to [facility] on 11/24/2017.  Patient developed stridor and was seen by ENT, found to have irregular inflammatory vocal cord changes. ENT recommended medrol dose pack and PPI.  Patient developed increasing stridor. Rapid response was consulted, patient given racemic epi.   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 S/p tracheostomy.   Has had episodic non severe bleeding from trach and ENT re-consulted and evaluated and Hemoglobin has been stable-patient has been on trach collar   Pyschiatry reevaluated patient and still recommending inpatient psychiatric admission.  Trach sutures removed 3/30   Assessment & Plan   Acute respiratory failure secondary to stridor/Glottic obstruction  -ENT, Dr. Redmond Baseman, consulted and appreciated- s/p transnasal fiberoptic laryngoscopy: posterior vocal folds have irregular inflammatory changes at the vocal processes. -patient was given 2 doses of IV decadron -As above, developed increasing stridor.  ENT reevaluated patient with bedside laryngoscopy 11/26/17 found posterior supraglottic swelling.  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   -s/p tracheostomy 11/28/17. -Had some superficial bleeding from tracheotomy and discussed on 3/27- discussed with Dr. Constance Holster 3/27 --patient has had some further minimal bleeding (1 tablespoon) from tracheotomy 3/29 and drop in sats to 85-88% but resolved with lower FiO2 -Worsening odor from [place] 3/26 : Discussed with the ENT Team, and IV Clindamycin was advised--should offe rmoderate coverage, but if grows Pseudomonas might chang eot levaquin po -Trach cult growing gram neg rods -no Solu-Medrol at this stage -SLP did perform swallow eval and upgraded to dysphagia 3 diet which she is tolerating well and drinking lots of juice -Stay sutures from tracheotomy removed 3/30  Cough causing headache -weaning off of fentanly --Pain control adjusted discontinue Toradol, use Percocet for pain and cut back fentanyl from 50-25 mcg every 2 as needed to q4 hourly  Intentional overdose with suicidal ideation -required intubation and is now extubated -psych reevaluated patient on 3/22- still recommended inpatient psych admission -Resuming Wellbutrin by mouth every 12 100 mg resumed gabapentin lower dose 300 twice daily  -holding Vistaril for now -resume trazodone 50 at bedtime   Acute kidney injury  Hyponatremia, hypervolemic from excess oral fluid Recurrent hyperkalemia -BUN/creatinine on admission 19/0.8 currently 35/1.0-->38/0.9  IVF 50 cc/h 3/30-->33/1.3 -Discontinue Toradol 3/30 -resumed diet 3/29 ?excessive fluids and wash down her sodium from 130-126--->130 with iv saluine -Will not fluid restrict at that time but will replace with saline -Hemolyzed sample on 3/30 , K now 4.0  Diabetes mellitus, type II with hyperglycemia -continue Lantus 38 units at bedtime home dose 28 70/30 twice daily -Holding metformin 1000 twice daily, Actos 15 daily  -CBG monitoring- changed 3/30 to QID 3/30-with resistant coverage  -CBGs ranging 152-240  Hypomagnesemia -resolved with replacement and last  check was 2.1--recheck 1.8-3/31--replace with Mag IV 2 gm   Seizure disorder -currently  stable  Anxiety/depression -needs inpatient psych admission -medications as above- currently held  Hypothyroidism -transitioned from IV 37.5-75 milligrams orally  Acute toxic encephalopathy -with inability to protect airway/in the setting of intentional overdose requiring intubation -drug screen negative,CT head unremarkable  -Improved, appears to be at baseline  Acute pulmonary edema -Improved, was on IV lasix -CXR on 3/21 reviewed and unremarkable for infection or edema -repeating CXR to ensure no new findings  Shortness of breath and chest pain suspect anxiety component -SOB resolved with trach placement -Workup at time chest x-ray, EKG, troponin, d-dimer unremarkable  Hypotension, sinus tachycardia -Transition 3/29 to metoprolol 12.5 twice daily and titrate to goal -slight hypotension-watch trends and adjust in am prn   DVT Prophylaxis  lovenox Code Status: Full Family Communication: no family-patient understands and I answered quesitons with help of writing board Disposition Plan: Inpatient psychiatry--stabilizing well-await final resp cultures and narrow abx as indicated   Consultants PCCM Psychiatry  ENT  Procedures  Intubation/extubation Renal US transnasal fiberoptic laryngoscopy x 2 Tracheostomy and direct laryngoscopy   Antibiotics   Anti-infectives (From admission, onward)   Start     Dose/Rate Route Frequency Ordered Stop   12/03/17 1800  clindamycin (CLEOCIN) IVPB 300 mg     300 mg 100 mL/hr over 30 Minutes Intravenous Every 6 hours 12/03/17 1458        Subjective:   Still headache-alert -doesn't like food-drinking non-caloric drinks now Headache with coughing, no fever no cp, no sob Resp noting thick secretions from trach-tan like-still bad odour   Objective:   Vitals:   12/08/17 0020 12/08/17 0245 12/08/17 0410 12/08/17 0802  BP: (!) 93/49   115/61 115/61  Pulse: 68 80 66   Resp: (!) 21 (!) 23 (!) 22   Temp: 98.4 F (36.9 C)  98.4 F (36.9 C) 99.3 F (37.4 C)  TempSrc: Axillary  Oral Oral  SpO2:  100% 95%   Weight:   (!) 148.9 kg (328 lb 4.2 oz)   Height:   5\' 2"  (1.575 m)     Intake/Output Summary (Last 24 hours) at 12/08/2017 0822 Last data filed at 12/08/2017 0650 Gross per 24 hour  Intake 1844.17 ml  Output -  Net 1844.17 ml   Filed Weights   12/06/17 0500 12/07/17 0500 12/08/17 0410  Weight: 136 kg (299 lb 13.2 oz) (!) 137 kg (302 lb 0.5 oz) (!) 148.9 kg (328 lb 4.2 oz)   Exam  Obese pleasant trach in place-sutures removed-thick tan secretions once coughin Bad odor still emanating from the same No head deformity no submandibular lymphadenopathy very thick neck,  s1 s2 no m/r/g RRR Chest clear without added sounds abd soft nt nd  Neuro intact Legs are swollen bilaterally  Data Reviewed: I have personally reviewed following labs and imaging studies  CBC: Recent Labs  Lab 12/02/17 1147 12/04/17 1058 12/05/17 0601 12/06/17 0758 12/08/17 0307  WBC 19.1* 16.7* 12.0* 15.0* 11.9*  NEUTROABS  --  12.9* 8.9* 11.2* 8.6*  HGB 15.1* 15.2* 13.4 13.7 12.0  HCT 46.6* 46.2* 41.7 41.8 37.0  MCV 91.2 89.7 89.5 88.9 87.9  PLT 456* 491* 472* 570* 811*   Basic Metabolic Panel: Recent Labs  Lab 12/02/17 1147  12/05/17 0601 12/06/17 0758 12/07/17 0617 12/07/17 0826 12/08/17 0307  NA 139   < > 137 135 130* 126* 130*  K 4.0   < > 3.6 3.5 5.6* 5.0 4.0  CL 96*   < > 98* 97* 94* 93* 96*  CO2  27   < > 25 24 22  19* 22  GLUCOSE 243*   < > 110* 90 200* 377* 126*  BUN 35*   < > 38* 31* 36* 36* 33*  CREATININE 1.02*   < > 0.92 1.01* 1.55* 1.56* 1.31*  CALCIUM 9.6   < > 9.0 9.1 9.0 8.6* 8.8*  MG 2.1  --   --   --  1.9  --  1.8  PHOS  --   --   --  3.4 4.1  --  3.8   < > = values in this interval not displayed.   GFR: Estimated Creatinine Clearance: 78.3 mL/min (A) (by C-G formula based on SCr of 1.31 mg/dL  (H)). Liver Function Tests: Recent Labs  Lab 12/02/17 1147 12/06/17 0758 12/07/17 0617 12/08/17 0307  AST 19  --   --   --   ALT 18  --   --   --   ALKPHOS 104  --   --   --   BILITOT 0.8  --   --   --   PROT 7.4  --   --   --   ALBUMIN 2.7* 2.5* 2.5* 2.4*   No results for input(s): LIPASE, AMYLASE in the last 168 hours. No results for input(s): AMMONIA in the last 168 hours. Coagulation Profile: No results for input(s): INR, PROTIME in the last 168 hours. Cardiac Enzymes: No results for input(s): CKTOTAL, CKMB, CKMBINDEX, TROPONINI in the last 168 hours. BNP (last 3 results) No results for input(s): PROBNP in the last 8760 hours. HbA1C: No results for input(s): HGBA1C in the last 72 hours. CBG: Recent Labs  Lab 12/07/17 0742 12/07/17 1135 12/07/17 1610 12/07/17 2122 12/08/17 0603  GLUCAP 287* 335* 240* 197* 152*   Lipid Profile: No results for input(s): CHOL, HDL, LDLCALC, TRIG, CHOLHDL, LDLDIRECT in the last 72 hours. Thyroid Function Tests: No results for input(s): TSH, T4TOTAL, FREET4, T3FREE, THYROIDAB in the last 72 hours. Anemia Panel: No results for input(s): VITAMINB12, FOLATE, FERRITIN, TIBC, IRON, RETICCTPCT in the last 72 hours. Urine analysis:    Component Value Date/Time   COLORURINE RED (A) 10/31/2016 2130   APPEARANCEUR CLOUDY (A) 10/31/2016 2130   LABSPEC 1.027 10/31/2016 2130   PHURINE 6.0 10/31/2016 2130   GLUCOSEU >=500 (A) 10/31/2016 2130   HGBUR LARGE (A) 10/31/2016 2130   BILIRUBINUR NEGATIVE 10/31/2016 2130   KETONESUR NEGATIVE 10/31/2016 2130   PROTEINUR 100 (A) 10/31/2016 2130   UROBILINOGEN 0.2 04/09/2014 1157   NITRITE NEGATIVE 10/31/2016 2130   LEUKOCYTESUR NEGATIVE 10/31/2016 2130   Sepsis Labs: @LABRCNTIP (procalcitonin:4,lacticidven:4)  ) Recent Results (from the past 240 hour(s))  MRSA PCR Screening     Status: None   Collection Time: 11/28/17 11:17 AM  Result Value Ref Range Status   MRSA by PCR NEGATIVE NEGATIVE Final     Comment:        The GeneXpert MRSA Assay (FDA approved for NASAL specimens only), is one component of a comprehensive MRSA colonization surveillance program. It is not intended to diagnose MRSA infection nor to guide or monitor treatment for MRSA infections. Performed at [facility], [place] [address], [place], [place] [address]   Culture, respiratory (NON-Expectorated)     Status: None   Collection Time: 11/30/17  4:28 AM  Result Value Ref Range Status   Specimen Description TRACHEAL ASPIRATE  Final   Special Requests NONE  Final   Gram Stain   Final    MODERATE WBC PRESENT,  PREDOMINANTLY PMN FEW GRAM POSITIVE COCCI    Culture   Final    Consistent with normal respiratory flora. Performed at [facility], [place] [address] [place], [place] [address]    Report Status 12/02/2017 FINAL  Final  Culture, expectorated sputum-assessment     Status: None (Preliminary result)   Collection Time: 12/07/17  5:18 AM  Result Value Ref Range Status   Specimen Description EXPECTORATED SPUTUM  Final   Special Requests NONE  Final   Sputum evaluation   Final    THIS SPECIMEN IS ACCEPTABLE FOR SPUTUM CULTURE Performed at [facility], [place] [address] [place], [place] [address]    Report Status PENDING  Incomplete  Culture, respiratory (NON-Expectorated)     Status: None (Preliminary result)   Collection Time: 12/07/17  5:18 AM  Result Value Ref Range Status   Specimen Description EXPECTORATED SPUTUM  Final   Special Requests NONE Reflexed from [id number]  Final   Gram Stain   Final    FEW WBC PRESENT, PREDOMINANTLY PMN FEW GRAM POSITIVE COCCI RARE GRAM NEGATIVE RODS Performed at [facility], [place] [address] [place],  [address]    Culture PENDING  Incomplete   Report Status PENDING  Incomplete      Radiology Studies: Dg Chest Port 1 View  Result Date: 12/07/2017 CLINICAL DATA:  Shortness of breath, cough and congestion. EXAM: PORTABLE CHEST 1 VIEW  COMPARISON:  11/28/2017 FINDINGS: Tracheostomy tube in stable position. The cardiac silhouette is mildly enlarged. Mediastinal contours appear intact. There is no evidence of pleural effusion or pneumothorax. Mild peribronchial airspace consolidation in bilateral lower lobes. Osseous structures are without acute abnormality. Soft tissues are grossly normal. IMPRESSION: Mild peribronchial airspace consolidation in bilateral lower lobes may represent acute bronchitis or early developing bronchopneumonia. Electronically Signed   By: Fidela Salisbury M.D.   On: 12/07/2017 00:30   Dg Swallowing Func-speech Pathology  Result Date: 12/06/2017 Objective Swallowing Evaluation: Type of Study: MBS-Modified Barium Swallow Study  Patient Details Name: DACOTA DEVALL MRN: [id number] Date of Birth: 06/02/1976 Today's Date: 12/06/2017 Time: SLP Start Time (ACUTE ONLY): 1200 -SLP Stop Time (ACUTE ONLY): 1223 SLP Time Calculation (min) (ACUTE ONLY): 23 min Past Medical History: Past Medical History: Diagnosis Date . Anxiety  . Complication of anesthesia   Pt. states takes long time to wake up from it.  . Cushing's disease ([place])  . Depression  . Diabetes ([place])  . Hyperlipidemia  . Hyperlipidemia  . Hypertension  . Morbid obesity ([place])  . Osteoporosis 07/19/2015 . Periprosthetic fracture 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[place]), R tibial plateau  07/18/2015 . Sleep apnea  . Uncontrolled diabetes mellitus with diabetic neuropathy, with long-term current use of insulin ([place]) 07/16/2015 . Vitamin D deficiency 07/19/2015 Past Surgical History: Past Surgical History: Procedure Laterality Date . ANTERIOR TALOFIBULAR LIGAMENT REPAIR Left 11/15/2014  Procedure: ANTERIOR TALOFIBULAR LIGAMENT REPAIR;  Surgeon: Jana Half, DPM;  Location: [facility];  Service: Podiatry;  Laterality: Left; . CESAREAN SECTION  dec 1997/  06-03-2001/   01-01-2005  BILATERAL TUBAL LIGATION WITH LAST ONE . DILATION AND CURETTAGE OF UTERUS  1995  WITH  SUCTION . ESOPHAGOGASTRODUODENOSCOPY (EGD) WITH PROPOFOL N/A 10/04/2016  Procedure: ESOPHAGOGASTRODUODENOSCOPY (EGD) WITH PROPOFOL;  Surgeon: [name], MD;  Location: [facility];  Service: Endoscopy;  Laterality: N/A; . LAPAROSCOPIC CHOLECYSTECTOMY  09-25-2005 . ORIF TIBIA PLATEAU Right 07/19/2015  Procedure: OPEN REDUCTION INTERNAL FIXATION (ORIF) RIGHT TIBIAL PLATEAU;  Surgeon: Altamese Arbovale,  MD;  Location: [facility];  Service: Orthopedics;  Laterality: Right; . PARTIAL KNEE ARTHROPLASTY Right 04/19/2014  Procedure: RIGHT UNI KNEE ARTHROPLASTY MEDIALLY ;  Surgeon: Mauri Pole, MD;  Location: [facility];  Service: Orthopedics;  Laterality: Right; . TRACHEOSTOMY TUBE PLACEMENT N/A 11/28/2017  Procedure: TRACHEOSTOMY;  Surgeon: Izora Gala, MD;  Location: [facility];  Service: ENT;  Laterality: N/A; . TUBAL LIGATION   HPI: Patient is a 42 y/o female who presents with drug OD and Sz. Intubated 3/13-3/15. resp failure with emergent trach 3/21.  ENT flexible laryngoscopy revealed posterior supraglottic swelling; swollen vocal folds with no abduction; edematous arytenoids; entire subglottis circumferentially swollen with necrotic debris and fibrinous exudate.  Subjective: alert Assessment / Plan / Recommendation CHL IP CLINICAL IMPRESSIONS 12/06/2017 Clinical Impression Pt presents with improved pharyngeal swallow.  Edema persists, but does not impede bolus passage through UES nor prevent airway protection.  Purees and mechanical solids pass easily into esophagus with no residue post-swallow.  Thin liquids were observed to consistently penetrate under the epiglottis, but they did not reach the vocal folds and were not aspirated.  Use of PMV did not impact the swallow.  Recommend pt begin a dysphagia 3 diet with thin liquids; crush meds in puree.  Discussed results/recs; pt engaged, smiling, and asking for jello.  SLP will follow for PO toleration/safety.   SLP Visit Diagnosis Dysphagia, pharyngeal phase (R13.13);[place]phonia  (R49.1) Attention and concentration deficit following -- Frontal lobe and executive function deficit following -- Impact on safety and function Mild aspiration risk   CHL IP TREATMENT RECOMMENDATION 12/06/2017 Treatment Recommendations Therapy as outlined in treatment plan below   Prognosis 12/02/2017 Prognosis for Safe Diet Advancement Good Barriers to Reach Goals -- Barriers/Prognosis Comment -- CHL IP DIET RECOMMENDATION 12/06/2017 SLP Diet Recommendations Dysphagia 3 (Mech soft) solids;Thin liquid Liquid Administration via Cup;Straw Medication Administration Crushed with puree Compensations Small sips/bites Postural Changes Remain semi-upright after after feeds/meals (Comment)   CHL IP OTHER RECOMMENDATIONS 12/06/2017 Recommended Consults -- Oral Care Recommendations Oral care BID Other Recommendations --   No flowsheet data found.  CHL IP FREQUENCY AND DURATION 12/06/2017 Speech Therapy Frequency (ACUTE ONLY) min 3x week Treatment Duration 1 week      CHL IP ORAL PHASE 12/06/2017 Oral Phase WFL Oral - Pudding Teaspoon -- Oral - Pudding Cup -- Oral - Honey Teaspoon -- Oral - Honey Cup -- Oral - Nectar Teaspoon -- Oral - Nectar Cup -- Oral - Nectar Straw -- Oral - Thin Teaspoon -- Oral - Thin Cup -- Oral - Thin Straw -- Oral - Puree -- Oral - Mech Soft -- Oral - Regular -- Oral - Multi-Consistency -- Oral - Pill -- Oral Phase - Comment --  CHL IP PHARYNGEAL PHASE 12/06/2017 Pharyngeal Phase Impaired Pharyngeal- Pudding Teaspoon -- Pharyngeal -- Pharyngeal- Pudding Cup -- Pharyngeal -- Pharyngeal- Honey Teaspoon NT Pharyngeal -- Pharyngeal- Honey Cup -- Pharyngeal -- Pharyngeal- Nectar Teaspoon Penetration/Aspiration before swallow Pharyngeal Material enters airway, remains ABOVE vocal cords then ejected out Pharyngeal- Nectar Cup -- Pharyngeal -- Pharyngeal- Nectar Straw -- Pharyngeal -- Pharyngeal- Thin Teaspoon NT Pharyngeal -- Pharyngeal- Thin Cup Penetration/Aspiration before swallow Pharyngeal Material enters  airway, remains ABOVE vocal cords and not ejected out Pharyngeal- Thin Straw Penetration/Aspiration before swallow Pharyngeal Material enters airway, remains ABOVE vocal cords and not ejected out Pharyngeal- Puree WFL Pharyngeal -- Pharyngeal- Mechanical Soft -- Pharyngeal -- Pharyngeal- Regular -- Pharyngeal -- Pharyngeal- Multi-consistency -- Pharyngeal -- Pharyngeal- Pill -- Pharyngeal -- Pharyngeal Comment --  No flowsheet data found. No flowsheet data found. [name] 12/06/2017, 12:30 PM                Scheduled Meds: . buPROPion  100 mg Oral BID  . chlorhexidine  15 mL Mouth Rinse BID  . cholestyramine  4 g Oral QODAY  . enoxaparin (LOVENOX) injection  65 mg Subcutaneous Q24H  . gabapentin  300 mg Oral BID  . insulin aspart  0-20 Units Subcutaneous TID WC  . insulin aspart  6 Units Subcutaneous TID WC  . insulin glargine  38 Units Subcutaneous QHS  . levothyroxine  75 mcg Oral QAC breakfast  . mouth rinse  15 mL Mouth Rinse q12n4p  . metoprolol tartrate  12.5 mg Oral BID  . pantoprazole (PROTONIX) IV  40 mg Intravenous BID  . protein supplement shake  11 oz Oral BID BM  . Racepinephrine HCl  0.5 mL Nebulization Once   Continuous Infusions: . sodium chloride 50 mL/hr at 12/07/17 2339  . clindamycin (CLEOCIN) IV Stopped (12/08/17 0655)     LOS: 18 days   Time Spent in minutes   15 minutes  Verneita Griffes, MD [facility] [phone]

## 2017-12-09 ENCOUNTER — Inpatient Hospital Stay (HOSPITAL_COMMUNITY): Payer: Medicaid Other

## 2017-12-09 LAB — CBC WITH DIFFERENTIAL/PLATELET
BASOS PCT: 0 %
Basophils Absolute: 0 10*3/uL (ref 0.0–0.1)
EOS ABS: 0.1 10*3/uL (ref 0.0–0.7)
EOS PCT: 1 %
HEMATOCRIT: 37.1 % (ref 36.0–46.0)
Hemoglobin: 11.7 g/dL — ABNORMAL LOW (ref 12.0–15.0)
Lymphocytes Relative: 27 %
Lymphs Abs: 2.7 10*3/uL (ref 0.7–4.0)
MCH: 28.4 pg (ref 26.0–34.0)
MCHC: 31.5 g/dL (ref 30.0–36.0)
MCV: 90 fL (ref 78.0–100.0)
MONO ABS: 1 10*3/uL (ref 0.1–1.0)
MONOS PCT: 10 %
Neutro Abs: 6.4 10*3/uL (ref 1.7–7.7)
Neutrophils Relative %: 62 %
PLATELETS: 451 10*3/uL — AB (ref 150–400)
RBC: 4.12 MIL/uL (ref 3.87–5.11)
RDW: 13.1 % (ref 11.5–15.5)
WBC: 10.1 10*3/uL (ref 4.0–10.5)

## 2017-12-09 LAB — RENAL FUNCTION PANEL
ALBUMIN: 2.3 g/dL — AB (ref 3.5–5.0)
Anion gap: 10 (ref 5–15)
BUN: 26 mg/dL — AB (ref 6–20)
CO2: 25 mmol/L (ref 22–32)
CREATININE: 1.05 mg/dL — AB (ref 0.44–1.00)
Calcium: 8.7 mg/dL — ABNORMAL LOW (ref 8.9–10.3)
Chloride: 98 mmol/L — ABNORMAL LOW (ref 101–111)
GFR calc Af Amer: >60 mL/min (ref >60)
GFR calc non Af Amer: >60 mL/min (ref >60)
GLUCOSE: 206 mg/dL — AB (ref 65–99)
PHOSPHORUS: 3.4 mg/dL (ref 2.5–4.6)
POTASSIUM: 4.2 mmol/L (ref 3.5–5.1)
Sodium: 133 mmol/L — ABNORMAL LOW (ref 135–145)

## 2017-12-09 LAB — EXPECTORATED SPUTUM ASSESSMENT W GRAM STAIN, RFLX TO RESP C

## 2017-12-09 LAB — GLUCOSE, CAPILLARY
GLUCOSE-CAPILLARY: 162 mg/dL — AB (ref 65–99)
GLUCOSE-CAPILLARY: 123 mg/dL — AB (ref 65–99)
Glucose-Capillary: 135 mg/dL — ABNORMAL HIGH (ref 65–99)
Glucose-Capillary: 136 mg/dL — ABNORMAL HIGH (ref 65–99)

## 2017-12-09 LAB — CULTURE, RESPIRATORY W GRAM STAIN

## 2017-12-09 LAB — CULTURE, RESPIRATORY

## 2017-12-09 LAB — EXPECTORATED SPUTUM ASSESSMENT W REFEX TO RESP CULTURE

## 2017-12-09 LAB — MAGNESIUM: Magnesium: 2 mg/dL (ref 1.7–2.4)

## 2017-12-09 MED ORDER — ENOXAPARIN SODIUM 80 MG/0.8ML ~~LOC~~ SOLN
70.0000 mg | SUBCUTANEOUS | Status: DC
  Administered 2017-12-09 – 2018-01-06 (×28): 70 mg via SUBCUTANEOUS
  Filled 2017-12-09 (×29): qty 0.8

## 2017-12-09 MED ORDER — CLINDAMYCIN HCL 300 MG PO CAPS
450.0000 mg | ORAL_CAPSULE | Freq: Three times a day (TID) | ORAL | Status: DC
  Administered 2017-12-09 – 2017-12-12 (×10): 450 mg via ORAL
  Filled 2017-12-09 (×12): qty 1

## 2017-12-09 MED ORDER — CLINDAMYCIN HCL 300 MG PO CAPS: 300.0000 mg | ORAL_CAPSULE | Freq: Three times a day (TID) | ORAL | Status: DC

## 2017-12-09 NOTE — Progress Notes (Signed)
CM met with patient and her spouse to discuss plan at d/c. [facility] [place], [place] is the only psychiatric facility that can accommodate her medical needs. Patient and spouse voiced understanding. CSW to fax information to [facility] for admission. CM following.

## 2017-12-09 NOTE — Progress Notes (Signed)
ANTICOAGULATION CONSULT NOTE  Pharmacy Consult:  Lovenox Indication: VTE prophylaxis  Allergies  Allergen Reactions  . Penicillins Hives    Has patient had a PCN reaction causing immediate rash, facial/tongue/throat swelling, SOB or lightheadedness with hypotension: Yes Has patient had a PCN reaction causing severe rash involving mucus membranes or skin necrosis: Yes Has patient had a PCN reaction that required hospitalization No Has patient had a PCN reaction occurring within the last 10 years: No If all of the above answers are "NO", then may proceed with Cephalosporin use.     Patient Measurements: Height: 5\' 2"  (157.5 cm) Weight: (!) 317 lb 14.5 oz (144.2 kg) IBW/kg (Calculated) : 50.1  Vital Signs: Temp: 98.5 F (36.9 C) (04/01 0852) Temp Source: Oral (04/01 0852) BP: 108/54 (04/01 0852) Pulse Rate: 82 (04/01 0852)  Labs: Recent Labs    12/07/17 0826 12/08/17 0307 12/09/17 0329  HGB  --  12.0 11.7*  HCT  --  37.0 37.1  PLT  --  512* 451*  CREATININE 1.56* 1.31* 1.05*    Estimated Creatinine Clearance: 95.6 mL/min (A) (by C-G formula based on SCr of 1.05 mg/dL (H)).    Assessment: 26 YOF continues on Lovenox for VTE prophylaxis.  Pharmacy dosing Lovenox at 0.5 mg/kg/d due to BMI of 55.  Today's weight continues to trend up.  Not on IVF to suggest weight is inaccurate.  SCr 1.05, nCrCL 79 ml/min.  CBC stable.   Goal of Therapy:  Monitor platelets by anticoagulation protocol: Yes    Plan:  Increase Lovenox to 70mg  SQ Q24H Pharmacy will monitor peripherally and adjust dosage as weight changes   Guneet Delpino D. Mina Marble, PharmD, BCPS Pager:  [phone] 12/09/2017, 10:13 AM

## 2017-12-09 NOTE — Progress Notes (Signed)
CSW following for discharge plan. CSW alerted by MD that patient has a diet and has switched IV medications to PO. CSW completed application for [facility] for inpatient psych. CSW faxed application packet to [facility], and spoke with Ulice Dash over the phone to complete the referral. Someone in [facility] will call back after reviewing referral information.  CSW will continue to follow.  Laveda Abbe, [place] Clinical Social Worker [phone]

## 2017-12-09 NOTE — Progress Notes (Signed)
Nutrition Follow-up  DOCUMENTATION CODES:   Morbid obesity  INTERVENTION:  Diet advanced to NDD3 per Speech Pathology Provide milk with all trays, patient does not like premier protein and other supplements  NUTRITION DIAGNOSIS:   Increased nutrient needs related to acute illness as evidenced by estimated needs. -ongoing  GOAL:   Patient will meet greater than or equal to 90% of their needs -progressing  MONITOR:   PO intake, I & O's, Weight trends, Labs, Supplement acceptance  ASSESSMENT:   42 yo female admitted with acute toxic encephalopathy secondary to intentional OD on baclofan, trazadone and  tramadol, seizures. Pt intubate for airway protection. Pt with hx of Cushing's, DM, OSA  Patient and husband stated today that her appetite is good. She was eating an egg salad sandwich during visit. PO over last 3 meals has been 70-100% No other complaints.   Diet Order:  DIET DYS 3 Room service appropriate? Yes; Fluid consistency: Thin  EDUCATION NEEDS:   Not appropriate for education at this time  Skin:  Skin Assessment: Skin Integrity Issues: Skin Integrity Issues:: Other (Comment) Other: MASD: abdomen, bilateral groin, under breasts  Last BM:  12/03/2017  Height:   Ht Readings from Last 1 Encounters:  12/08/17 5\' 2"  (1.575 m)    Weight:   Wt Readings from Last 1 Encounters:  12/09/17 (!) 317 lb 14.5 oz (144.2 kg)    Ideal Body Weight:  52.3 kg  BMI:  Body mass index is 58.15 kg/m.  Estimated Nutritional Needs:   Kcal:  1832-2200 calories (ABW x25-30)  Protein:  115-131 grams  Fluid:  1.8-2.2L  Satira Anis. Cheryl Chay, MS, RD LDN Inpatient Clinical Dietitian Pager [phone]

## 2017-12-09 NOTE — Progress Notes (Signed)
Physical Therapy Treatment Patient Details Name: [name] MRN: [id number] DOB: 04/29/1976 Today's Date: 12/09/2017    History of Present Illness Patient is a 42 y/o female who presents with drug OD and Sz. Intubated 3/13-3/15. resp failure with emergent trach 3/21 due to vocal cord dysfunction. PMH includes ORIF RLE, depression, anxiety, morbid obesity, DM, Cushing's, Rt uni knee replacement, HTN, left ankle surgery, dyslipidemia, hypothyroidism.     PT Comments    Patient progressing well towards PT goals. Improved ambulation distance with less standing rest breaks today and able to maintain Sp02 >90% on 30% TC. HR up to 120 bpm. Pt would really benefit from going outside for a few mins with RN if cleared by MD for mental health/sanity. Notified RN of recommendation. Pt with secretions through trach prior to and post mobility. Instructed sitter on how to place pt on appropriate 02 to assist with ambulation later. Will follow.    Follow Up Recommendations  Supervision for mobility/OOB;Other (comment)([facility])     Equipment Recommendations  Rolling walker with 5" wheels    Recommendations for Other Services       Precautions / Restrictions Precautions Precautions: Fall Precaution Comments: tachycardia; suicide precautions, trach Restrictions Weight Bearing Restrictions: No    Mobility  Bed Mobility               General bed mobility comments: Pt was OOB in the recliner chair.   Transfers Overall transfer level: Needs assistance Equipment used: Rolling walker (2 wheeled) Transfers: Sit to/from [facility] Sit to Stand: Min guard Stand pivot transfers: Min assist       General transfer comment: min guard assist for safety. Stood from chair x2, SPT chair to/from [facility] x1 with min A for balance.   Ambulation/Gait Ambulation/Gait assistance: Min guard Ambulation Distance (Feet): 200 Feet Assistive device: Rolling walker (2 wheeled) Gait  Pattern/deviations: Step-through pattern;Decreased stride length;Wide base of support Gait velocity: decreased   General Gait Details: Slow, steady gait with 2/4 DOE. On 30% TC, 1 standing rest break but not because she needed it, due to traffic in hall. HR up to 120 bpm. Sp02 stayed >90%.   Stairs            Wheelchair Mobility    Modified Rankin (Stroke Patients Only)       Balance Overall balance assessment: Needs assistance Sitting-balance support: Feet supported;No upper extremity supported Sitting balance-Leahy Scale: Good     Standing balance support: During functional activity Standing balance-Leahy Scale: Fair Standing balance comment: Able to perform pericare without external support.                            Cognition Arousal/Alertness: Awake/alert Behavior During Therapy: WFL for tasks assessed/performed Overall Cognitive Status: Within Functional Limits for tasks assessed                                        Exercises      General Comments        Pertinent Vitals/Pain Pain Assessment: No/denies pain    Home Living                      Prior Function            PT Goals (current goals can now be found in the care plan section) Progress towards  PT goals: Progressing toward goals    Frequency    Min 3X/week      PT Plan Current plan remains appropriate    Co-evaluation              AM-PAC PT "6 Clicks" Daily Activity  Outcome Measure  Difficulty turning over in bed (including adjusting bedclothes, sheets and blankets)?: None Difficulty moving from lying on back to sitting on the side of the bed? : None Difficulty sitting down on and standing up from a chair with arms (e.g., wheelchair, bedside commode, etc,.)?: None Help needed moving to and from a bed to chair (including a wheelchair)?: A Little Help needed walking in hospital room?: A Little Help needed climbing 3-5 steps with a  railing? : A Lot 6 Click Score: 20    End of Session Equipment Utilized During Treatment: Oxygen Activity Tolerance: Patient tolerated treatment well Patient left: in chair;with call bell/phone within reach;with nursing/sitter in room;with family/visitor present Nurse Communication: Mobility status PT Visit Diagnosis: Muscle weakness (generalized) (M62.81);Unsteadiness on feet (R26.81);Difficulty in walking, not elsewhere classified (R26.2)     Time: 1107-1140 PT Time Calculation (min) (ACUTE ONLY): 33 min  Charges:  $Gait Training: 8-22 mins $Therapeutic Exercise: 8-22 mins                    G Codes:       Wray Kearns, PT, DPT [phone]     Marguarite Arbour A Tykisha Areola 12/09/2017, 11:57 AM

## 2017-12-09 NOTE — Consult Note (Signed)
[facility] Nurse wound consult note Reason for Consult:Peri trach wounds, full thickness Wound type:Pressure, moisture Pressure Injury POA: Post procedure/trauma of insertion Measurement: 4 wounds, largest is <1cm round.  Unable to assess depth due to hydrocolloid dressing placement. Wound bed: Unable to assess Drainage (amount, consistency, odor) Dressing byproduct covers wound bed, is augmenting healing process. Periwound: intact Dressing procedure/placement/frequency: I have provided Nursing with guidance via the orders for twice weekly and PRN hydrocolloid dressing placement in the peri-trach area (Sundays and Wednesdays). Trach collar rests on hydrocolloid.  No "split" gauze trach dressing is required.  Cleanse trach area at least daily and PRN.  [facility] nursing team will not follow, but will remain available to this patient, the nursing and medical teams.  Please re-consult if needed. Thanks, Maudie Flakes, MSN, RN, [place], Arther Abbott  Pager# [phone]

## 2017-12-09 NOTE — Progress Notes (Addendum)
PROGRESS NOTE    [name]  GYJ:[id number] DOB: 08/01/1976 DOA: 11/20/2017 PCP: Sandi Mariscal, MD   Brief Narrative:  41-year- Body mass index is 58.15 kg/m.Cushing's disease, diabetes on insulin, depression, hypertension in a setting pfo priopor pre-eclmapsia when previously pregnant, OSA ? On cvpap-prior TIb plateau # 07/2015, R side knee arthroplasty Admitted on 3/13 to ICU for intentional drug overdose of baclofen, trazodone and tramadol.   Was encephalopathic and had generalized seizure in ER therefore patient was intubated and admitted by pulmonary critical care.  Patient was evaluated by psychiatrist who recommended inpatient psych on discharge.  Started on diuretics for pulmonary congestion.  Transferred to [facility] on 11/24/2017.  Patient developed stridor and was seen by ENT, found to have irregular inflammatory vocal cord changes. ENT recommended medrol dose pack and PPI.  Patient developed increasing stridor. Rapid response was consulted, patient given racemic epi.   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 S/p tracheostomy.   Has had episodic non severe bleeding from trach and ENT re-consulted and evaluated and Hemoglobin has been stable-patient has been on trach collar   Pyschiatry reevaluated patient and still recommending inpatient psychiatric admission.  Trach sutures removed 3/30   Assessment & Plan   Acute respiratory failure secondary to stridor/Glottic obstruction  -ENT, Dr. Redmond Baseman, consulted and appreciated- s/p transnasal fiberoptic laryngoscopy: posterior vocal folds have irregular inflammatory changes at the vocal processes. -patient was given 2 doses of IV decadron -As above, developed increasing stridor.  ENT reevaluated patient with bedside laryngoscopy 11/26/17 found posterior supraglottic swelling.  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   -s/p tracheostomy 11/28/17. -Had some superficial bleeding from tracheotomy and discussed on 3/27- discussed with Dr. Constance Holster 3/27 --patient has had some further minimal bleeding (1 tablespoon) from tracheotomy 3/29 and drop in sats to 85-88% but resolved with lower FiO2 -Worsening odor from [place] 3/26 : Discussed with the ENT Team, and IV Clindamycin has been transitioned to PO clindamycin 300 q8 from 4/1---overall stop date is 12/17/17 but can be adjusted as an OP by ENT if prn -Trach cult growing gram neg rods -no Solu-Medrol at this stage -SLP did perform swallow eval and upgraded to dysphagia 3 diet which she is tolerating well and drinking lots of juice -Stay sutures from tracheotomy removed 3/30 -nearing d/c in 24-48 hours if no fevers no chills nor bleeding from trach -when available, Nursing staff to ambulate patient with oxygen cannister  Cough causing headache --Pain control adjusted discontinue Toradol, use Percocet for pain and cut back fentanyl from 50-25 mcg every 2 as needed to q4 hourly  Intentional overdose with suicidal ideation -extubated since -psych reevaluated patient on 3/22- still recommended inpatient psych admission -Resuming Wellbutrin by mouth every 12 100 mg resumed gabapentin lower dose 300 twice daily  -holding Vistaril for now -resume trazodone 50 at bedtime   Acute kidney injury  Hyponatremia, hypervolemic from excess oral fluid Recurrent hyperkalemia -BUN/creatinine on admission 19/0.8 currently 35/1.0-->38/0.9  IVF 50 cc/h 3/30-->33/1.3 -Discontinue Toradol 3/30 -resumed diet 3/29 ?excessive fluids and wash down her sodium from 130-126--->130 with iv saluine -saline locked 4/1  Diabetes mellitus, type II with hyperglycemia -continue Lantus 38 units at bedtime [home dose 28 70/30 twice daily] -Holding metformin 1000 twice daily, Actos 15 daily  -CBG monitoring- changed 3/30 to QID 3/30-with resistant coverage  -CBGs ranging  162-206  Hypomagnesemia -resolved with replacement and last check was 2.1--recheck 1.8-3/31--replace  with Mag IV 2 gm and currently 2.0   Seizure disorder -currently stable  Anxiety/depression -needs inpatient psych admission -medications as above- currently held  Hypothyroidism -transitioned from IV 37.5-75 milligrams orally  Acute toxic encephalopathy -with inability to protect airway/in the setting of intentional overdose requiring intubation -drug screen negative,CT head unremarkable  -Improved, appears to be at baseline  Acute pulmonary edema -Improved, was on IV lasix -CXR on 3/21 reviewed and unremarkable for infection or edema -last cxr 3/30 Early BPNA--should be covered by clindamycin given aspirates--repeat CXR 4/2 am   Shortness of breath and chest pain suspect anxiety component -SOB resolved with trach placement -Workup at time chest x-ray, EKG, troponin, d-dimer unremarkable  Hypotension, sinus tachycardia -Transition 3/29 to metoprolol 12.5 twice daily and titrate to goal -slight hypotension-but only while asleep, no change to meds   DVT Prophylaxis  lovenox Code Status: Full Family Communication: d/w husband at the bedside Disposition Plan: Inpatient psychiatry--stabilizing well-await final resp cultures and narrow abx as indicated   Consultants PCCM Psychiatry  ENT  Procedures  Intubation/extubation Renal US transnasal fiberoptic laryngoscopy x 2 Tracheostomy and direct laryngoscopy   Antibiotics   Anti-infectives (From admission, onward)   Start     Dose/Rate Route Frequency Ordered Stop   12/03/17 1800  clindamycin (CLEOCIN) IVPB 300 mg     300 mg 100 mL/hr over 30 Minutes Intravenous Every 6 hours 12/03/17 1458        Subjective:   Headache better No new issue  No fever no chills Still copious secretions She is bored   Objective:   Vitals:   12/09/17 0000 12/09/17 0329 12/09/17 0401 12/09/17 0852  BP: 115/62  (!) 97/56  (!) 108/54  Pulse: 75  72 82  Resp:   18 17  Temp: 98.7 F (37.1 C)  98.3 F (36.8 C) 98.5 F (36.9 C)  TempSrc: Oral  Axillary Oral  SpO2: 95% 98%  98%  Weight:   (!) 144.2 kg (317 lb 14.5 oz)   Height:        Intake/Output Summary (Last 24 hours) at 12/09/2017 0959 Last data filed at 12/08/2017 2200 Gross per 24 hour  Intake 711.66 ml  Output 400 ml  Net 311.66 ml   Filed Weights   12/08/17 0410 12/08/17 1348 12/09/17 0401  Weight: (!) 148.9 kg (328 lb 4.2 oz) (!) 139.7 kg (307 lb 15.7 oz) (!) 144.2 kg (317 lb 14.5 oz)   Exam  Obese pleasant trach in place-sutures removed-thick tan secretions once coughin Bad odor still emanating from the same No head deformity no submandibular lymphadenopathy very thick neck,  s1 s2 no m/r/g RRR  Chest clear without added sounds abd soft nt nd  Neuro intact Legs are swollen bilaterally  Data Reviewed: I have personally reviewed following labs and imaging studies  CBC: Recent Labs  Lab 12/04/17 1058 12/05/17 0601 12/06/17 0758 12/08/17 0307 12/09/17 0329  WBC 16.7* 12.0* 15.0* 11.9* 10.1  NEUTROABS 12.9* 8.9* 11.2* 8.6* 6.4  HGB 15.2* 13.4 13.7 12.0 11.7*  HCT 46.2* 41.7 41.8 37.0 37.1  MCV 89.7 89.5 88.9 87.9 90.0  PLT 491* 472* 570* 512* 381*   Basic Metabolic Panel: Recent Labs  Lab 12/02/17 1147  12/06/17 0758 12/07/17 0617 12/07/17 0826 12/08/17 0307 12/09/17 0329  NA 139   < > 135 130* 126* 130* 133*  K 4.0   < > 3.5 5.6* 5.0 4.0 4.2  CL 96*   < > 97* 94* 93* 96* 98*  CO2 27   < > 24 22 19* 22 25  GLUCOSE 243*   < > 90 200* 377* 126* 206*  BUN 35*   < > 31* 36* 36* 33* 26*  CREATININE 1.02*   < > 1.01* 1.55* 1.56* 1.31* 1.05*  CALCIUM 9.6   < > 9.1 9.0 8.6* 8.8* 8.7*  MG 2.1  --   --  1.9  --  1.8 2.0  PHOS  --   --  3.4 4.1  --  3.8 3.4   < > = values in this interval not displayed.   GFR: Estimated Creatinine Clearance: 95.6 mL/min (A) (by C-G formula based on SCr of 1.05 mg/dL (H)). Liver Function  Tests: Recent Labs  Lab 12/02/17 1147 12/06/17 0758 12/07/17 0617 12/08/17 0307 12/09/17 0329  AST 19  --   --   --   --   ALT 18  --   --   --   --   ALKPHOS 104  --   --   --   --   BILITOT 0.8  --   --   --   --   PROT 7.4  --   --   --   --   ALBUMIN 2.7* 2.5* 2.5* 2.4* 2.3*   No results for input(s): LIPASE, AMYLASE in the last 168 hours. No results for input(s): AMMONIA in the last 168 hours. Coagulation Profile: No results for input(s): INR, PROTIME in the last 168 hours. Cardiac Enzymes: No results for input(s): CKTOTAL, CKMB, CKMBINDEX, TROPONINI in the last 168 hours. BNP (last 3 results) No results for input(s): PROBNP in the last 8760 hours. HbA1C: No results for input(s): HGBA1C in the last 72 hours. CBG: Recent Labs  Lab 12/08/17 0603 12/08/17 1112 12/08/17 1613 12/08/17 2118 12/09/17 0604  GLUCAP 152* 181* 192* 166* 162*   Lipid Profile: No results for input(s): CHOL, HDL, LDLCALC, TRIG, CHOLHDL, LDLDIRECT in the last 72 hours. Thyroid Function Tests: No results for input(s): TSH, T4TOTAL, FREET4, T3FREE, THYROIDAB in the last 72 hours. Anemia Panel: No results for input(s): VITAMINB12, FOLATE, FERRITIN, TIBC, IRON, RETICCTPCT in the last 72 hours. Urine analysis:    Component Value Date/Time   COLORURINE RED (A) 10/31/2016 2130   APPEARANCEUR CLOUDY (A) 10/31/2016 2130   LABSPEC 1.027 10/31/2016 2130   PHURINE 6.0 10/31/2016 2130   GLUCOSEU >=500 (A) 10/31/2016 2130   HGBUR LARGE (A) 10/31/2016 2130   BILIRUBINUR NEGATIVE 10/31/2016 2130   KETONESUR NEGATIVE 10/31/2016 2130   PROTEINUR 100 (A) 10/31/2016 2130   UROBILINOGEN 0.2 04/09/2014 1157   NITRITE NEGATIVE 10/31/2016 2130   LEUKOCYTESUR NEGATIVE 10/31/2016 2130   Sepsis Labs: @LABRCNTIP (procalcitonin:4,lacticidven:4)  ) Recent Results (from the past 240 hour(s))  Culture, respiratory (NON-Expectorated)     Status: None   Collection Time: 11/30/17  4:28 AM  Result Value Ref Range  Status   Specimen Description TRACHEAL ASPIRATE  Final   Special Requests NONE  Final   Gram Stain   Final    MODERATE WBC PRESENT, PREDOMINANTLY PMN FEW GRAM POSITIVE COCCI    Culture   Final    Consistent with normal respiratory flora. Performed at [facility], [place] [address], [name], [place] [address]    Report Status 12/02/2017 FINAL  Final  Culture, expectorated sputum-assessment     Status: None (Preliminary result)   Collection Time: 12/07/17  5:18 AM  Result Value Ref Range Status   Specimen Description EXPECTORATED SPUTUM  Final  Special Requests NONE  Final   Sputum evaluation   Final    THIS SPECIMEN IS ACCEPTABLE FOR SPUTUM CULTURE Performed at [facility], [place] [address] [place], [place] [address]    Report Status PENDING  Incomplete  Culture, respiratory (NON-Expectorated)     Status: None (Preliminary result)   Collection Time: 12/07/17  5:18 AM  Result Value Ref Range Status   Specimen Description EXPECTORATED SPUTUM  Final   Special Requests NONE Reflexed from [id number]  Final   Gram Stain   Final    FEW WBC PRESENT, PREDOMINANTLY PMN FEW GRAM POSITIVE COCCI RARE GRAM NEGATIVE RODS    Culture   Final    CULTURE REINCUBATED FOR BETTER GROWTH Performed at [facility], [place] [address], [place], [place] [address]    Report Status PENDING  Incomplete      Radiology Studies: No results found.   Scheduled Meds: . buPROPion  100 mg Oral BID  . chlorhexidine  15 mL Mouth Rinse BID  . cholestyramine  4 g Oral QODAY  . enoxaparin (LOVENOX) injection  70 mg Subcutaneous Q24H  . gabapentin  300 mg Oral BID  . insulin aspart  0-20 Units Subcutaneous TID WC  . insulin aspart  6 Units Subcutaneous TID WC  . insulin glargine  38 Units Subcutaneous QHS  . levothyroxine  75 mcg Oral QAC breakfast  . mouth rinse  15 mL Mouth Rinse q12n4p  . metoprolol tartrate  12.5 mg Oral BID  . pantoprazole  40 mg Oral BID AC  . protein supplement shake  11  oz Oral BID BM  . Racepinephrine HCl  0.5 mL Nebulization Once   Continuous Infusions: . clindamycin (CLEOCIN) IV Stopped (12/09/17 0656)     LOS: 19 days   Time Spent in minutes   15 minutes  Verneita Griffes, MD [facility] [phone]

## 2017-12-10 ENCOUNTER — Inpatient Hospital Stay (HOSPITAL_COMMUNITY): Payer: Medicaid Other

## 2017-12-10 LAB — RENAL FUNCTION PANEL
ANION GAP: 10 (ref 5–15)
Albumin: 2 g/dL — ABNORMAL LOW (ref 3.5–5.0)
BUN: 18 mg/dL (ref 6–20)
CHLORIDE: 100 mmol/L — AB (ref 101–111)
CO2: 26 mmol/L (ref 22–32)
Calcium: 8.9 mg/dL (ref 8.9–10.3)
Creatinine, Ser: 0.74 mg/dL (ref 0.44–1.00)
Glucose, Bld: 171 mg/dL — ABNORMAL HIGH (ref 65–99)
POTASSIUM: 4.5 mmol/L (ref 3.5–5.1)
Phosphorus: 3.3 mg/dL (ref 2.5–4.6)
Sodium: 136 mmol/L (ref 135–145)

## 2017-12-10 LAB — CBC WITH DIFFERENTIAL/PLATELET
BASOS ABS: 0 10*3/uL (ref 0.0–0.1)
Basophils Relative: 0 %
Eosinophils Absolute: 0.1 10*3/uL (ref 0.0–0.7)
Eosinophils Relative: 1 %
HEMATOCRIT: 34.7 % — AB (ref 36.0–46.0)
HEMOGLOBIN: 10.8 g/dL — AB (ref 12.0–15.0)
LYMPHS PCT: 23 %
Lymphs Abs: 2.2 10*3/uL (ref 0.7–4.0)
MCH: 27.9 pg (ref 26.0–34.0)
MCHC: 31.1 g/dL (ref 30.0–36.0)
MCV: 89.7 fL (ref 78.0–100.0)
MONO ABS: 0.7 10*3/uL (ref 0.1–1.0)
Monocytes Relative: 7 %
NEUTROS ABS: 6.7 10*3/uL (ref 1.7–7.7)
Neutrophils Relative %: 69 %
Platelets: 434 10*3/uL — ABNORMAL HIGH (ref 150–400)
RBC: 3.87 MIL/uL (ref 3.87–5.11)
RDW: 13.1 % (ref 11.5–15.5)
WBC: 9.6 10*3/uL (ref 4.0–10.5)

## 2017-12-10 LAB — GLUCOSE, CAPILLARY
GLUCOSE-CAPILLARY: 142 mg/dL — AB (ref 65–99)
GLUCOSE-CAPILLARY: 110 mg/dL — AB (ref 65–99)
GLUCOSE-CAPILLARY: 221 mg/dL — AB (ref 65–99)
GLUCOSE-CAPILLARY: 228 mg/dL — AB (ref 65–99)

## 2017-12-10 MED ORDER — TRIPLE ANTIBIOTIC 3.5-400-5000 EX OINT
1.0000 "application " | TOPICAL_OINTMENT | Freq: Once | CUTANEOUS | Status: AC | PRN
  Administered 2017-12-14: 1 via CUTANEOUS
  Filled 2017-12-10: qty 1

## 2017-12-10 MED ORDER — LIDOCAINE-EPINEPHRINE (PF) 1 %-1:200000 IJ SOLN
0.0000 mL | Freq: Once | INTRAMUSCULAR | Status: DC | PRN
  Filled 2017-12-10: qty 30

## 2017-12-10 MED ORDER — OXYMETAZOLINE HCL 0.05 % NA SOLN
1.0000 | Freq: Once | NASAL | Status: DC | PRN
  Filled 2017-12-10: qty 15

## 2017-12-10 MED ORDER — IOPAMIDOL (ISOVUE-300) INJECTION 61%
INTRAVENOUS | Status: AC
  Administered 2017-12-10: 70 mL
  Filled 2017-12-10: qty 100

## 2017-12-10 MED ORDER — SILVER NITRATE-POT NITRATE 75-25 % EX MISC
1.0000 | Freq: Once | CUTANEOUS | Status: DC | PRN
  Filled 2017-12-10: qty 1

## 2017-12-10 MED ORDER — LIDOCAINE HCL 4 % EX SOLN
0.0000 mL | Freq: Once | CUTANEOUS | Status: DC | PRN
  Filled 2017-12-10: qty 50

## 2017-12-10 MED ORDER — LIDOCAINE HCL 2 % EX GEL
1.0000 "application " | Freq: Once | CUTANEOUS | Status: DC | PRN
  Filled 2017-12-10: qty 5

## 2017-12-10 NOTE — Progress Notes (Signed)
Pt off floor at this time for test.  Unable to assess

## 2017-12-10 NOTE — Progress Notes (Signed)
12/10/2017 1:19 PM  [name], [name] [id number]      Temp:  [98.4 F (36.9 C)-98.7 F (37.1 C)] 98.7 F (37.1 C) (04/02 1211) Pulse Rate:  [79-100] 83 (04/02 1211) Resp:  [13-28] 24 (04/02 1211) BP: (104-121)/(37-72) 104/37 (04/02 1211) SpO2:  [94 %-100 %] 100 % (04/02 1211) FiO2 (%):  [30 %-40 %] 30 % (04/02 1211) Weight:  [142.1 kg (313 lb 4.4 oz)] 142.1 kg (313 lb 4.4 oz) (04/02 0456),     Intake/Output Summary (Last 24 hours) at 12/10/2017 1319 Last data filed at 12/10/2017 1253 Gross per 24 hour  Intake 360 ml  Output 1400 ml  Net -1040 ml    Results for orders placed or performed during the hospital encounter of 11/20/17 (from the past 24 hour(s))  Glucose, capillary     Status: Abnormal   Collection Time: 12/09/17  4:21 PM  Result Value Ref Range   Glucose-Capillary 136 (H) 65 - 99 mg/dL   Comment 1 Notify RN    Comment 2 Document in Chart   Glucose, capillary     Status: Abnormal   Collection Time: 12/09/17  9:32 PM  Result Value Ref Range   Glucose-Capillary 123 (H) 65 - 99 mg/dL   Comment 1 Notify RN    Comment 2 Document in Chart   CBC with Differential/Platelet     Status: Abnormal   Collection Time: 12/10/17  3:30 AM  Result Value Ref Range   WBC 9.6 4.0 - 10.5 K/uL   RBC 3.87 3.87 - 5.11 MIL/uL   Hemoglobin 10.8 (L) 12.0 - 15.0 g/dL   HCT 34.7 (L) 36.0 - 46.0 %   MCV 89.7 78.0 - 100.0 fL   MCH 27.9 26.0 - 34.0 pg   MCHC 31.1 30.0 - 36.0 g/dL   RDW 13.1 11.5 - 15.5 %   Platelets 434 (H) 150 - 400 K/uL   Neutrophils Relative % 69 %   Neutro Abs 6.7 1.7 - 7.7 K/uL   Lymphocytes Relative 23 %   Lymphs Abs 2.2 0.7 - 4.0 K/uL   Monocytes Relative 7 %   Monocytes Absolute 0.7 0.1 - 1.0 K/uL   Eosinophils Relative 1 %   Eosinophils Absolute 0.1 0.0 - 0.7 K/uL   Basophils Relative 0 %   Basophils Absolute 0.0 0.0 - 0.1 K/uL  Renal function panel     Status: Abnormal   Collection Time: 12/10/17  3:30 AM  Result Value Ref Range   Sodium 136 135 - 145  mmol/L   Potassium 4.5 3.5 - 5.1 mmol/L   Chloride 100 (L) 101 - 111 mmol/L   CO2 26 22 - 32 mmol/L   Glucose, Bld 171 (H) 65 - 99 mg/dL   BUN 18 6 - 20 mg/dL   Creatinine, Ser 0.74 0.44 - 1.00 mg/dL   Calcium 8.9 8.9 - 10.3 mg/dL   Phosphorus 3.3 2.5 - 4.6 mg/dL   Albumin 2.0 (L) 3.5 - 5.0 g/dL   GFR calc non Af Amer >60 >60 mL/min   GFR calc Af Amer >60 >60 mL/min   Anion gap 10 5 - 15  Glucose, capillary     Status: Abnormal   Collection Time: 12/10/17  6:31 AM  Result Value Ref Range   Glucose-Capillary 142 (H) 65 - 99 mg/dL   Comment 1 Notify RN    Comment 2 Document in Chart   Glucose, capillary     Status: Abnormal   Collection Time: 12/10/17 11:32 AM  Result  Value Ref Range   Glucose-Capillary 110 (H) 65 - 99 mg/dL    SUBJECTIVE:  Trach wound very tender. Odorous.  CT today suggests LEFT paralaryngeal abscess.  Able to swallow foods and liquids without pain or difficulty.   OBJECTIVE:  Wound necrosis and odor.  Able to phonate some with trach finger occluded.    Using 2 % viscous xylocaine, flexible laryngoscope introduced per RIGHT nose.  Pooled secretions in pharynx.  Epiglottis appears nl.  Sl LEFT arytenoid swelling but no erythema.  Laryngeal airway not good.  Could not assess vocal cord mobility or subglottic patency.    IMPRESSION:  No laryngeal abscess.  CT shows probable secretions pooled in LEFT pyriform sinus.    PLAN:  Will try acetic acid wet-dry dressings over trach wound.  May try iodoform nu gauze packing.  Speech path may be able to initiate PMV speech now.  Cannot predict when she may be medically stable enough for transfer to [facility].  [name]

## 2017-12-10 NOTE — Progress Notes (Signed)
RT NOTE:  RT has seen patient multiple time during night to suction trach per patient request. Moderate amounts of foul smelling, tan secretions continue to drain around patients trach site. Pt complains trach is very sore with slight movement of trach to clean site. Pt also complaining of burning and itching below chin down to trach site. RT has cleaned and replaced drain sponge twice tonight. RT made RN aware of this and RN stated she will report to dayshift RN for ENT consult.

## 2017-12-10 NOTE — Progress Notes (Signed)
PROGRESS NOTE    [name]  HWE:[id number] DOB: 1976-03-10 DOA: 11/20/2017 PCP: Sandi Mariscal, MD   Brief Narrative:  41-year- Body mass index is 57.3 kg/m.Cushing's disease, diabetes on insulin, depression, hypertension in a setting pfo priopor pre-eclmapsia when previously pregnant, OSA ? On cvpap-prior TIb plateau # 07/2015, R side knee arthroplasty Admitted on 3/13 to ICU for intentional drug overdose of baclofen, trazodone and tramadol.   Was encephalopathic and had generalized seizure in ER therefore patient was intubated and admitted by pulmonary critical care.  Patient was evaluated by psychiatrist who recommended inpatient psych on discharge.  Started on diuretics for pulmonary congestion.  Transferred to [facility] on 11/24/2017.  Patient developed stridor and was seen by ENT, found to have irregular inflammatory vocal cord changes. ENT recommended medrol dose pack and PPI.  Patient developed increasing stridor. Rapid response was consulted, patient given racemic epi.   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 S/p tracheostomy.   Has had episodic non severe bleeding from trach and ENT re-consulted and evaluated and Hemoglobin has been stable-patient has been on trach collar   Pyschiatry reevaluated patient and still recommending inpatient psychiatric admission.  Trach sutures removed 3/30   Assessment & Plan   Acute respiratory failure secondary to stridor/Glottic obstruction  -ENT, Dr. Redmond Baseman, consulted and appreciated- s/p transnasal fiberoptic laryngoscopy: posterior vocal folds have irregular inflammatory changes at the vocal processes. -patient was given 2 doses of IV decadron -As above, developed increasing stridor.  ENT reevaluated patient with bedside laryngoscopy 11/26/17 found posterior supraglottic swelling.  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   -s/p tracheostomy 11/28/17. -Had some superficial bleeding from tracheotomy and discussed on 3/27- discussed with Dr. Constance Holster 3/27 --patient has had some further minimal bleeding (1 tablespoon) from tracheotomy 3/29 and drop in sats to 85-88% but resolved with lower FiO2 -Stay sutures from tracheotomy removed 3/30 -Worsening odor from [facility] 3/26 -now having pain as well -CT NECK 4/2 shows possible Abcsess.  ENT to re-evaluate for possible surgery--NPO until otherwise -Trach cult growing gram neg rods  Cough causing headache --Pain control adjusted discontinue Toradol, use Percocet for pain and cut back fentanyl from 50-25 mcg every 2 as needed to q4 hourly  Intentional overdose with suicidal ideation -extubated since -psych reevaluated patient on 3/22- still recommended inpatient psych admission -Resuming Wellbutrin by mouth every 12 100 mg resumed gabapentin lower dose 300 twice daily  -holding Vistaril for now -resume trazodone 50 at bedtime.  Acute kidney injury  Hyponatremia, hypervolemic from excess oral fluid Recurrent hyperkalemia -BUN/creatinine on admission 19/0.8 currently 35/1.0-->38/0.9  IVF 50 cc/h 3/30-->33/1.3 -Discontinue Toradol 3/30 -resumed diet 3/29 ?excessive fluids and wash down her sodium from 130-126--->130 with iv saline -saline locked 4/1  Diabetes mellitus, type II with hyperglycemia -continue Lantus 38 units at bedtime [home dose 28 70/30 twice daily] -Holding metformin 1000 twice daily, Actos 15 daily  -CBG monitoring- changed 3/30 to QID 3/30-with resistant coverage  -CBGs ranging 123-142  Hypomagnesemia -resolved with replacement and last check was 2.1--recheck 1.8-3/31--replace with Mag IV 2 gm and currently 2.0 -cont Bupropion 100 bid   Seizure disorder -currently stable  Anxiety/depression -needs inpatient psych admission -medications as above  Hypothyroidism -transitioned from IV 37.5-75 milligrams orally  Acute toxic  encephalopathy -with inability to protect airway/in the setting of intentional overdose requiring intubation -drug screen negative,CT head unremarkable  -Improved, appears to be at baseline  Acute  pulmonary edema -Improved, was on IV lasix -CXR on 3/21 reviewed and unremarkable for infection or edema -last cxr 3/30 Early BPNA---see above disucssions re: neck abcess  Shortness of breath and chest pain suspect anxiety component -SOB resolved with trach placement -Workup at time chest x-ray, EKG, troponin, d-dimer unremarkable  Hypotension, sinus tachycardia -Transition 3/29 to metoprolol 12.5 twice daily and titrate to goal -slight hypotension-but only while asleep, no change to meds   DVT Prophylaxis  lovenox Code Status: Full Family Communication: d/w husband at the bedside Disposition Plan: Inpatient psychiatry--stabilizing well-await final resp cultures and narrow abx as indicated   Consultants PCCM Psychiatry  ENT  Procedures  Intubation/extubation Renal US transnasal fiberoptic laryngoscopy x 2 Tracheostomy and direct laryngoscopy   Antibiotics   Anti-infectives (From admission, onward)   Start     Dose/Rate Route Frequency Ordered Stop   12/09/17 1400  clindamycin (CLEOCIN) capsule 300 mg  Status:  Discontinued     300 mg Oral Every 8 hours 12/09/17 1015 12/09/17 1016   12/09/17 1400  clindamycin (CLEOCIN) capsule 450 mg     450 mg Oral Every 8 hours 12/09/17 1016     12/03/17 1800  clindamycin (CLEOCIN) IVPB 300 mg  Status:  Discontinued     300 mg 100 mL/hr over 30 Minutes Intravenous Every 6 hours 12/03/17 1458 12/09/17 1015      Subjective:   Headache better, L ear ache, neck pain [tells me has had ortho work-up for pinched nerve in recent past] Secretions about the same   Objective:   Vitals:   12/10/17 0116 12/10/17 0122 12/10/17 0456 12/10/17 0755  BP:  111/72 106/69 (!) 104/54  Pulse: 94 88 98 90  Resp: 16 17 16 17   Temp:  98.5 F (36.9  C) 98.4 F (36.9 C)   TempSrc:  Oral Oral   SpO2: 96% 95% 98% 96%  Weight:   (!) 142.1 kg (313 lb 4.4 oz)   Height:        Intake/Output Summary (Last 24 hours) at 12/10/2017 1111 Last data filed at 12/10/2017 1051 Gross per 24 hour  Intake 360 ml  Output 1100 ml  Net -740 ml   Filed Weights   12/08/17 1348 12/09/17 0401 12/10/17 0456  Weight: (!) 139.7 kg (307 lb 15.7 oz) (!) 144.2 kg (317 lb 14.5 oz) (!) 142.1 kg (313 lb 4.4 oz)   Exam  Obese pleasant trach in place-thick tan secretions once coughin Bad odor still emanating from the same L ear doesn't show any fluid behind it No head deformity no submandibular lymphadenopathy very thick neck,  s1 s2 no m/r/g RRR  Chest clear without added sounds abd soft nt nd  Neuro intact Legs are swollen bilaterally  Data Reviewed: I have personally reviewed following labs and imaging studies  CBC: Recent Labs  Lab 12/05/17 0601 12/06/17 0758 12/08/17 0307 12/09/17 0329 12/10/17 0330  WBC 12.0* 15.0* 11.9* 10.1 9.6  NEUTROABS 8.9* 11.2* 8.6* 6.4 6.7  HGB 13.4 13.7 12.0 11.7* 10.8*  HCT 41.7 41.8 37.0 37.1 34.7*  MCV 89.5 88.9 87.9 90.0 89.7  PLT 472* 570* 512* 451* 366*   Basic Metabolic Panel: Recent Labs  Lab 12/06/17 0758 12/07/17 0617 12/07/17 0826 12/08/17 0307 12/09/17 0329 12/10/17 0330  NA 135 130* 126* 130* 133* 136  K 3.5 5.6* 5.0 4.0 4.2 4.5  CL 97* 94* 93* 96* 98* 100*  CO2 24 22 19* 22 25 26   GLUCOSE 90 200* 377* 126* 206* 171*  BUN 31* 36* 36* 33* 26* 18  CREATININE 1.01* 1.55* 1.56* 1.31* 1.05* 0.74  CALCIUM 9.1 9.0 8.6* 8.8* 8.7* 8.9  MG  --  1.9  --  1.8 2.0  --   PHOS 3.4 4.1  --  3.8 3.4 3.3   GFR: Estimated Creatinine Clearance: 124.4 mL/min (by C-G formula based on SCr of 0.74 mg/dL). Liver Function Tests: Recent Labs  Lab 12/06/17 0758 12/07/17 0617 12/08/17 0307 12/09/17 0329 12/10/17 0330  ALBUMIN 2.5* 2.5* 2.4* 2.3* 2.0*   No results for input(s): LIPASE, AMYLASE in the last  168 hours. No results for input(s): AMMONIA in the last 168 hours. Coagulation Profile: No results for input(s): INR, PROTIME in the last 168 hours. Cardiac Enzymes: No results for input(s): CKTOTAL, CKMB, CKMBINDEX, TROPONINI in the last 168 hours. BNP (last 3 results) No results for input(s): PROBNP in the last 8760 hours. HbA1C: No results for input(s): HGBA1C in the last 72 hours. CBG: Recent Labs  Lab 12/09/17 0604 12/09/17 1126 12/09/17 1621 12/09/17 2132 12/10/17 0631  GLUCAP 162* 135* 136* 123* 142*   Lipid Profile: No results for input(s): CHOL, HDL, LDLCALC, TRIG, CHOLHDL, LDLDIRECT in the last 72 hours. Thyroid Function Tests: No results for input(s): TSH, T4TOTAL, FREET4, T3FREE, THYROIDAB in the last 72 hours. Anemia Panel: No results for input(s): VITAMINB12, FOLATE, FERRITIN, TIBC, IRON, RETICCTPCT in the last 72 hours. Urine analysis:    Component Value Date/Time   COLORURINE RED (A) 10/31/2016 2130   APPEARANCEUR CLOUDY (A) 10/31/2016 2130   LABSPEC 1.027 10/31/2016 2130   PHURINE 6.0 10/31/2016 2130   GLUCOSEU >=500 (A) 10/31/2016 2130   HGBUR LARGE (A) 10/31/2016 2130   BILIRUBINUR NEGATIVE 10/31/2016 2130   KETONESUR NEGATIVE 10/31/2016 2130   PROTEINUR 100 (A) 10/31/2016 2130   UROBILINOGEN 0.2 04/09/2014 1157   NITRITE NEGATIVE 10/31/2016 2130   LEUKOCYTESUR NEGATIVE 10/31/2016 2130   Sepsis Labs: @LABRCNTIP (procalcitonin:4,lacticidven:4)  ) Recent Results (from the past 240 hour(s))  Culture, expectorated sputum-assessment     Status: None   Collection Time: 12/07/17  5:18 AM  Result Value Ref Range Status   Specimen Description EXPECTORATED SPUTUM  Final   Special Requests NONE  Final   Sputum evaluation   Final    THIS SPECIMEN IS ACCEPTABLE FOR SPUTUM CULTURE Performed at [facility], [place] [address], [place], [place] [address]    Report Status 12/09/2017 FINAL  Final  Culture, respiratory (NON-Expectorated)     Status: None    Collection Time: 12/07/17  5:18 AM  Result Value Ref Range Status   Specimen Description EXPECTORATED SPUTUM  Final   Special Requests NONE Reflexed from [id number]  Final   Gram Stain   Final    FEW WBC PRESENT, PREDOMINANTLY PMN FEW GRAM POSITIVE COCCI RARE GRAM NEGATIVE RODS Performed at [facility], [place] [address] [place],  [address]    Culture MODERATE STREPTOCOCCUS GROUP C  Final   Report Status 12/09/2017 FINAL  Final      Radiology Studies: Ct Soft Tissue Neck W Contrast  Result Date: 12/10/2017 CLINICAL DATA:  42 year old female with moderate amounts of foul smelling tan secretions draining from the tracheostomy site. 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 EXAM: CT NECK WITH CONTRAST TECHNIQUE: Multidetector CT imaging of the neck was performed using the standard protocol following the bolus administration of intravenous contrast. CONTRAST:  41mL ISOVUE-300 IOPAMIDOL (ISOVUE-300) INJECTION 61%  COMPARISON:  Head CT without contrast 11/20/2017. Cervical spine MRI 07/15/2013. FINDINGS: Large body habitus. Pharynx and larynx: Tracheostomy tube in place. There is a small volume of fluid and gas along the caudal aspect of the tracheostomy incision best seen on sagittal image 45. This appears to be contained within the incision site, with no associated rim enhancing fluid collection. Above the tracheostomy the glottis is closed. The oropharynx is largely effaced. Negative nasopharynx aside from mild symmetric appearing adenoid hypertrophy. The superior parapharyngeal and retropharyngeal spaces are normal. The lower retropharyngeal space remains normal. However, in the left supraglottic space there are two gas and fluid containing collections which appear to communicate and encompasses an area of 24 x 14 x 28 millimeters. These do not appear to be within the lumen of the left  piriform sinus, but rather appear pathologic in the left lateral larynx potential space subjacent to the left thyroid cartilage. See series 3, image 56 and sagittal image 54. No thyroid cartilage or other laryngeal cartilage destruction is evident. Salivary glands: Negative. Thyroid: Within normal limits. Lymph nodes: Mild asymmetric enlargement of the left level 2 and level 3 lymph nodes, but these maintain a physiologic fatty hilum (series 3, image 42). Other lymph node stations appear normal. No cystic or necrotic nodes. Vascular: Suboptimal intravascular contrast bolus, but the major vascular structures in the neck and at the skull base appear to remain patent. Limited intracranial: Negative. Visualized orbits: Negative. Mastoids and visualized paranasal sinuses: Clear. Skeleton: Occasional carious dentition. Cervical spine degeneration. No acute osseous abnormality identified. Upper chest: Mediastinal lipomatosis. Small right paratracheal lymph nodes. No superior mediastinal inflammation is evident. Negative lung apices. IMPRESSION: 1. A left lateral supraglottic laryngeal Abscess is suspected (sagittal image 54) with pockets of low-density fluid and gas subjacent to the left thyroid cartilage. This measures up to 2.8 cm. No cartilage destruction. I considered but do not suspect that this is simply trapped fluid and gas in the left piriform sinus. 2. Otherwise there is only a small volume of gas and fluid located within the inferior tracheostomy incision site (sagittal image 45). 3. Mild reactive appearing left level II/III and right paratracheal lymph nodes. 4. Otherwise negative neck CT. Electronically Signed   By: Genevie Ann M.D.   On: 12/10/2017 10:48   Dg Chest Port 1 View  Result Date: 12/09/2017 CLINICAL DATA:  Pneumonia EXAM: PORTABLE CHEST 1 VIEW COMPARISON:  12/06/2017; 11/25/2017; 11/10/2017 FINDINGS: Grossly unchanged cardiac silhouette and mediastinal contours. Stable positioning of support  apparatus. Improved aeration of lung bases without new focal airspace opacity. No pleural effusion or pneumothorax. No evidence of edema. No acute osseous abnormalities. IMPRESSION: Improved aeration of the lung bases without superimposed acute cardiopulmonary disease on this AP portable examination. Further evaluation with a PA and lateral chest radiograph may be obtained as clinically indicated. Electronically Signed   By: Sandi Mariscal M.D.   On: 12/09/2017 18:28     Scheduled Meds: . buPROPion  100 mg Oral BID  . chlorhexidine  15 mL Mouth Rinse BID  . cholestyramine  4 g Oral QODAY  . clindamycin  450 mg Oral Q8H  . enoxaparin (LOVENOX) injection  70 mg Subcutaneous Q24H  . gabapentin  300 mg Oral BID  . insulin aspart  0-20 Units Subcutaneous TID WC  . insulin aspart  6 Units Subcutaneous TID WC  . insulin glargine  38 Units Subcutaneous QHS  . levothyroxine  75 mcg Oral QAC breakfast  . mouth rinse  15 mL  Mouth Rinse q12n4p  . metoprolol tartrate  12.5 mg Oral BID  . pantoprazole  40 mg Oral BID AC  . protein supplement shake  11 oz Oral BID BM  . Racepinephrine HCl  0.5 mL Nebulization Once   Continuous Infusions:    LOS: 20 days   Time Spent in minutes   35 minutes, including care coordination time with Radiology as well as ENT  Verneita Griffes, MD [facility] (P) [phone]

## 2017-12-10 NOTE — Progress Notes (Signed)
OT Cancellation Note  Patient Details Name: [name] MRN: [id number] DOB: [birth year]   Cancelled Treatment:    Reason Eval/Treat Not Completed: Patient at procedure or test/ unavailable  [place], Mickel Baas, OT [phone] 12/10/2017, 9:26 AM

## 2017-12-10 NOTE — Progress Notes (Signed)
Called by Radiologist Dr. Nevada Crane Re: CT scan neck  Trach along course of tube shows a small amount of fluid--small amount of gas and fluid.  At the larynx above the level the tube--?fluid gas and fluid--2 cm Supraglottic abscess  D/w Dr. Constance Holster ENT to see the patient-?need surgery Keep NPO  Verneita Griffes, MD [facility] [phone]

## 2017-12-10 NOTE — Progress Notes (Signed)
SLP Cancellation Note  Patient Details Name: [name] MRN: [id number] DOB: June 23, 1976   Cancelled treatment:       Reason Eval/Treat Not Completed: Other (comment) pt with supraglottic laryngeal abscess per CT neck; now NPO, awaiting ENT re-eval.  SLP will follow along.   [name] 12/10/2017, 12:37 PM

## 2017-12-10 NOTE — Progress Notes (Signed)
CSW following for discharge plan. CSW received call from Gwyndolyn Saxon at [facility]missions that patient has been denied admission due to medical complexity at this time; patient is requiring more medical care than [place] can handle at this time due to trach secretions.   Per Medical Director, patient will be accepted once she is more medically stable with her trach care. CSW will attempt to send referral again for [facility] Admissions once patient's trach is more stable.  CSW will continue to follow.  Laveda Abbe,  Clinical Social Worker [phone]

## 2017-12-10 NOTE — Progress Notes (Signed)
Occupational Therapy Treatment Patient Details Name: [name] MRN: [id number] DOB: 04/12/1976 Today's Date: 12/10/2017    History of present illness Patient is a 42 y/o female who presents with drug OD and Sz. Intubated 3/13-3/15. resp failure with emergent trach 3/21 due to vocal cord dysfunction. PMH includes ORIF RLE, depression, anxiety, morbid obesity, DM, Cushing's, Rt uni knee replacement, HTN, left ankle surgery, dyslipidemia, hypothyroidism.    OT comments  Pt tear with OT when discussing her daugther  Follow Up Recommendations  Other (comment)(follow up OT at behavioral psych )    Equipment Recommendations  None recommended by OT    Recommendations for Other Services      Precautions / Restrictions Precautions Precautions: Fall Precaution Comments: tachycardia; suicide precautions, trach Restrictions Weight Bearing Restrictions: No       Mobility Bed Mobility               General bed mobility comments: Pt was OOB in the recliner chair.   Transfers Overall transfer level: Needs assistance   Transfers: Sit to/from Stand Sit to Stand: Min assist              Balance Overall balance assessment: Needs assistance Sitting-balance support: Feet supported;No upper extremity supported Sitting balance-Leahy Scale: Good     Standing balance support: During functional activity Standing balance-Leahy Scale: Fair                             ADL either performed or assessed with clinical judgement   ADL Overall ADL's : Needs assistance/impaired     Grooming: Standing;Set up;Cueing for safety;Cueing for sequencing                       Toileting- Clothing Manipulation and Hygiene: Moderate assistance;Sit to/from stand;Cueing for safety;Cueing for sequencing         General ADL Comments: pt with 3 sit to stand transitions with OT.  Pts HR increased to 120.  Pt able to stand and perform self grooming task     Vision  Baseline Vision/History: No visual deficits            Cognition Arousal/Alertness: Awake/alert Behavior During Therapy: WFL for tasks assessed/performed Overall Cognitive Status: Within Functional Limits for tasks assessed                                                General Comments      Pertinent Vitals/ Pain       Pain Score: 4  Pain Location: headache and around trach area Pain Intervention(s): Limited activity within patient's tolerance;Monitored during session;Patient requesting pain meds-RN notified     Prior Functioning/Environment              Frequency  Min 3X/week        Progress Toward Goals  OT Goals(current goals can now be found in the care plan section)  Progress towards OT goals: Progressing toward goals     Plan Discharge plan remains appropriate    Co-evaluation                 AM-PAC PT "6 Clicks" Daily Activity     Outcome Measure   Help from another person eating meals?: Total Help from another person taking care of personal grooming?: A  Little Help from another person toileting, which includes using toliet, bedpan, or urinal?: A Little Help from another person bathing (including washing, rinsing, drying)?: A Lot Help from another person to put on and taking off regular upper body clothing?: A Little Help from another person to put on and taking off regular lower body clothing?: A Lot 6 Click Score: 14    End of Session Equipment Utilized During Treatment: Oxygen;Rolling walker  OT Visit Diagnosis: Muscle weakness (generalized) (M62.81)   Activity Tolerance Patient limited by fatigue   Patient Left in chair;with call bell/phone within reach;with nursing/sitter in room   Nurse Communication Mobility status(elevated HR with activity )        Time: 1340-1355 OT Time Calculation (min): 15 min  Charges: OT General Charges $OT Visit: 1 Visit OT Treatments $Self Care/Home Management : 8-22  mins  [place], [place]   Payton Mccallum D 12/10/2017, 2:11 PM

## 2017-12-11 LAB — GLUCOSE, CAPILLARY
Glucose-Capillary: 186 mg/dL — ABNORMAL HIGH (ref 65–99)
Glucose-Capillary: 174 mg/dL — ABNORMAL HIGH (ref 65–99)
Glucose-Capillary: 167 mg/dL — ABNORMAL HIGH (ref 65–99)
Glucose-Capillary: 184 mg/dL — ABNORMAL HIGH (ref 65–99)

## 2017-12-11 MED ORDER — IBUPROFEN 200 MG PO TABS
600.0000 mg | ORAL_TABLET | Freq: Four times a day (QID) | ORAL | Status: DC | PRN
  Administered 2017-12-11 – 2017-12-17 (×8): 600 mg via ORAL
  Filled 2017-12-11 (×8): qty 3

## 2017-12-11 MED ORDER — INSULIN GLARGINE 100 UNIT/ML ~~LOC~~ SOLN
40.0000 [IU] | Freq: Every day | SUBCUTANEOUS | Status: DC
  Administered 2017-12-11 – 2017-12-22 (×12): 40 [IU] via SUBCUTANEOUS
  Filled 2017-12-11 (×13): qty 0.4

## 2017-12-11 NOTE — Progress Notes (Signed)
PROGRESS NOTE  [name]  XIP:[id number] DOB: 1976/08/09 DOA: 11/20/2017 PCP: Sandi Mariscal, MD   Brief Narrative: [name] is a 42 y.o. female with a history of morbid obesity (BMI 31), IDDM, OSA, HTN, Cushing's disease and depression who presented 3/13 after suicide attempt by intentional overdose of baclofen, trazodone, and tramadol. She reportedly was encephalopathic and experienced a seizure, intubated for airway protection and admitted to the ICU. After extubation she developed stridor initially responsive to racemic epinephrine with laryngoscopy revealing fixed vocal cords, though when this recurred 3/21 it was unresponsive necessitating tracheostomy with laryngoscopy revealing circumferential subglottic swelling with necrotic debris and fibrinous exudate. Respiratory status has stabilized, though she continued to have significant secretions with odor. Clindamycin was started 3/27 and ENT reevaluated the patient 4/2 due to CT showing left paralaryngeal abscess. Flexible laryngoscopy was repeated showing pooled secretions in pharynx, and inability to assess vocal cord mobility or subglottic patency due to poor laryngeal airway. Psychiatry has continued to recommend transfer to inpatient psychiatry for suicidal ideation and attempt when medically stable.  Assessment & Plan: Principal Problem:   Severe episode of recurrent major depressive disorder, without psychotic features ([place]) Active Problems:   Intentional overdose of drug in tablet form ([name])   Status epilepticus ([name])   Shortness of breath   Hoarse voice quality   Hypomagnesemia   Acute respiratory failure with hypoxemia (HCC)   SOB (shortness of breath)   Tracheostomy status (HCC)  Acute respiratory failure secondary to glottic obstruction, fixed vocal cords s/p intubation: Repeat laryngoscopy by Dr. Erik Obey showed significant laryngeal edema precluding visualization of vocal cords/subglottic patency.  - Continue trach collar,  PMV as tolerated -ENT, Dr. Redmond Baseman, consulted and appreciated- s/p transnasal fiberoptic laryngoscopy: posterior vocal folds have irregular inflammatory changes at the vocal processes. -patient was given doses of IV decadron -As above, developed increasing stridor.  ENT reevaluated patient with bedside laryngoscopy 11/26/17 found posterior supraglottic swelling.  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   -s/p tracheostomy 11/28/17. -Had some superficial bleeding from tracheotomy and discussed on 3/27- discussed with Dr. Constance Holster 3/27 --patient has had some further minimal bleeding (1 tablespoon) from tracheotomy 3/29 and drop in sats to 85-88% but resolved with lower FiO2 -CT NECK 4/2 shows possible Abcsess.  ENT to re-evaluate for possible surgery--NPO until otherwise -Trach cult growing gram neg rods  Headache: Possibly related to OD, current medications, depression, TMJ. No red flags or indications for imaging.  - Continue percocet, fentanyl prn severe pain. Will add ibuprofen for now (renal function normalized). Deescalate fentanyl as able over the next couple days.   Intentional overdose with suicidal ideation, depression: To inpatient psychiatry hospital when medically stable.  - Resumed wellbutrin, gabapentin, trazodone.  - Continue sitter, suicide precautions - Appreciate psychiatry consultation  Acute kidney injury: Resolved. Monitor with reinitiation of NSAID.   Diabetes mellitus, type II with hyperglycemia: Last HbA1c 7.2%, possibly some hyperglycemia from steroids. Near inpatient goal. - Continue Lantus 38 units at bedtime [home dose 28 70/30 twice daily], 6u mealtime, and resistant SSI - Holding metformin 1000 twice daily, Actos 15 daily   Hypomagnesemia: Resolved with repletion.   Seizure disorder: Stable, no activity.  - Continue gabapentin, and, since no seizures and has been on wellbutrin will continue this. Suspect neurovegetative depression is  compelling indication in this setting.   Hypothyroidism: TSH 3.587 - Continue synthroid   Acute toxic encephalopathy: Due to OD. UDS and CT head negative. Resolved.   Acute pulmonary  edema: Resolved with lasix.  -Improved, was on IV lasix -CXR on 3/21 reviewed and unremarkable for infection or edema -last cxr 3/30 Early BPNA---see above disucssions re: neck abcess  Shortness of breath and chest pain: Suspect anxiety component. Resolved with trach placement. Workup at time chest x-ray, EKG, troponin, d-dimer unremarkable. Resolved.  Hypotension, sinus tachycardia - Transitioned 3/29 to metoprolol 12.5 twice daily. Stable.  DVT prophylaxis: Lovenox Code Status: Full Family Communication: Husband at bedside Disposition Plan: Inpatient psychiatry when medically stable. Still having tracheostomy issues precluding transfer. Uncertain expected date of stability.   Consultants:   CCM  Psychiatry  ENT  Procedures:  Intubation/extubation Renal US transnasal fiberoptic laryngoscopy x 2 Tracheostomy and direct laryngoscopy   Antimicrobials:  Clindamycin 3/26 >>   Subjective: Feeling more depressed today, main complaint is left sided headache that is constant, severe, pounding associated with "fullness" behind the left eye but no visual deficits, changes, photophobia, nausea or vomiting. Also has bilateral neck pain due to longstanding spinal disease.  Objective: Vitals:   12/11/17 1055 12/11/17 1219 12/11/17 1541 12/11/17 1558  BP:    (!) 101/57  Pulse: 78   76  Resp: 18   17  Temp:  98.5 F (36.9 C)  98.1 F (36.7 C)  TempSrc:  Oral  Oral  SpO2: 99%  97%   Weight:      Height:        Intake/Output Summary (Last 24 hours) at 12/11/2017 1742 Last data filed at 12/11/2017 1700 Gross per 24 hour  Intake 1140 ml  Output 750 ml  Net 390 ml   Filed Weights   12/09/17 0401 12/10/17 0456 12/11/17 0500  Weight: (!) 144.2 kg (317 lb 14.5 oz) (!) 142.1 kg (313 lb 4.4 oz)  (!) 140.5 kg (309 lb 11.9 oz)    Gen: Morbidly obese 42 y.o. female in no distress. Cushingoid habitus. Neck: Trach collar with thick tan secretions, no odor noted. Difficult to appreciate LNs.  Pulm: Non-labored breathing through trach, no wheezing or stridor. Clear to auscultation bilaterally.  CV: Regular rate and rhythm. No murmur, rub, or gallop. Unable to appreciate JVD, Bilateral pedal edema. GI: Abdomen soft, non-tender, non-distended, with normoactive bowel sounds. No organomegaly or masses felt. Ext: Warm, no deformities Skin: No wounds.  Neuro: Alert and oriented. Hoares without ability to phonate, communicates with dry erase board. No focal neurological deficits. Psych: Very depressed with restricted affect.   Data Reviewed: I have personally reviewed following labs and imaging studies  CBC: Recent Labs  Lab 12/05/17 0601 12/06/17 0758 12/08/17 0307 12/09/17 0329 12/10/17 0330  WBC 12.0* 15.0* 11.9* 10.1 9.6  NEUTROABS 8.9* 11.2* 8.6* 6.4 6.7  HGB 13.4 13.7 12.0 11.7* 10.8*  HCT 41.7 41.8 37.0 37.1 34.7*  MCV 89.5 88.9 87.9 90.0 89.7  PLT 472* 570* 512* 451* 390*   Basic Metabolic Panel: Recent Labs  Lab 12/06/17 0758 12/07/17 0617 12/07/17 0826 12/08/17 0307 12/09/17 0329 12/10/17 0330  NA 135 130* 126* 130* 133* 136  K 3.5 5.6* 5.0 4.0 4.2 4.5  CL 97* 94* 93* 96* 98* 100*  CO2 24 22 19* 22 25 26   GLUCOSE 90 200* 377* 126* 206* 171*  BUN 31* 36* 36* 33* 26* 18  CREATININE 1.01* 1.55* 1.56* 1.31* 1.05* 0.74  CALCIUM 9.1 9.0 8.6* 8.8* 8.7* 8.9  MG  --  1.9  --  1.8 2.0  --   PHOS 3.4 4.1  --  3.8 3.4 3.3   GFR: Estimated  Creatinine Clearance: 123.5 mL/min (by C-G formula based on SCr of 0.74 mg/dL). Liver Function Tests: Recent Labs  Lab 12/06/17 0758 12/07/17 0617 12/08/17 0307 12/09/17 0329 12/10/17 0330  ALBUMIN 2.5* 2.5* 2.4* 2.3* 2.0*   No results for input(s): LIPASE, AMYLASE in the last 168 hours. No results for input(s): AMMONIA in the  last 168 hours. Coagulation Profile: No results for input(s): INR, PROTIME in the last 168 hours. Cardiac Enzymes: No results for input(s): CKTOTAL, CKMB, CKMBINDEX, TROPONINI in the last 168 hours. BNP (last 3 results) No results for input(s): PROBNP in the last 8760 hours. HbA1C: No results for input(s): HGBA1C in the last 72 hours. CBG: Recent Labs  Lab 12/10/17 1618 12/10/17 2114 12/11/17 0700 12/11/17 1136 12/11/17 1703  GLUCAP 221* 228* 186* 174* 167*   Lipid Profile: No results for input(s): CHOL, HDL, LDLCALC, TRIG, CHOLHDL, LDLDIRECT in the last 72 hours. Thyroid Function Tests: No results for input(s): TSH, T4TOTAL, FREET4, T3FREE, THYROIDAB in the last 72 hours. Anemia Panel: No results for input(s): VITAMINB12, FOLATE, FERRITIN, TIBC, IRON, RETICCTPCT in the last 72 hours. Urine analysis:    Component Value Date/Time   COLORURINE RED (A) 10/31/2016 2130   APPEARANCEUR CLOUDY (A) 10/31/2016 2130   LABSPEC 1.027 10/31/2016 2130   PHURINE 6.0 10/31/2016 2130   GLUCOSEU >=500 (A) 10/31/2016 2130   HGBUR LARGE (A) 10/31/2016 2130   BILIRUBINUR NEGATIVE 10/31/2016 2130   KETONESUR NEGATIVE 10/31/2016 2130   PROTEINUR 100 (A) 10/31/2016 2130   UROBILINOGEN 0.2 04/09/2014 1157   NITRITE NEGATIVE 10/31/2016 2130   LEUKOCYTESUR NEGATIVE 10/31/2016 2130   Recent Results (from the past 240 hour(s))  Culture, expectorated sputum-assessment     Status: None   Collection Time: 12/07/17  5:18 AM  Result Value Ref Range Status   Specimen Description EXPECTORATED SPUTUM  Final   Special Requests NONE  Final   Sputum evaluation   Final    THIS SPECIMEN IS ACCEPTABLE FOR SPUTUM CULTURE Performed at [facility], [place] [address] [place], [place] [address]    Report Status 12/09/2017 FINAL  Final  Culture, respiratory (NON-Expectorated)     Status: None   Collection Time: 12/07/17  5:18 AM  Result Value Ref Range Status   Specimen Description EXPECTORATED SPUTUM   Final   Special Requests NONE Reflexed from [id number]  Final   Gram Stain   Final    FEW WBC PRESENT, PREDOMINANTLY PMN FEW GRAM POSITIVE COCCI RARE GRAM NEGATIVE RODS Performed at [facility], [place] [address] [place],  [address]    Culture MODERATE STREPTOCOCCUS GROUP C  Final   Report Status 12/09/2017 FINAL  Final      Radiology Studies: Ct Soft Tissue Neck W Contrast  Addendum Date: 12/10/2017   ADDENDUM REPORT: 12/10/2017 11:17 ADDENDUM: Study discussed by telephone with Dr. Nita Sells on 12/10/2017 at 1102 hours. Electronically Signed   By: Genevie Ann M.D.   On: 12/10/2017 11:17   Result Date: 12/10/2017 CLINICAL DATA:  42 year old female with moderate amounts of foul smelling tan secretions draining from the tracheostomy site. 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 EXAM: CT NECK WITH CONTRAST TECHNIQUE: Multidetector CT imaging of the neck was performed using the standard protocol following the bolus administration of intravenous contrast. CONTRAST:  82mL ISOVUE-300 IOPAMIDOL (ISOVUE-300) INJECTION 61% COMPARISON:  Head CT without contrast 11/20/2017. Cervical spine MRI 07/15/2013. FINDINGS: Large body habitus. Pharynx and  larynx: Tracheostomy tube in place. There is a small volume of fluid and gas along the caudal aspect of the tracheostomy incision best seen on sagittal image 45. This appears to be contained within the incision site, with no associated rim enhancing fluid collection. Above the tracheostomy the glottis is closed. The oropharynx is largely effaced. Negative nasopharynx aside from mild symmetric appearing adenoid hypertrophy. The superior parapharyngeal and retropharyngeal spaces are normal. The lower retropharyngeal space remains normal. However, in the left supraglottic space there are two gas and fluid containing collections which appear to communicate  and encompasses an area of 24 x 14 x 28 millimeters. These do not appear to be within the lumen of the left piriform sinus, but rather appear pathologic in the left lateral larynx potential space subjacent to the left thyroid cartilage. See series 3, image 56 and sagittal image 54. No thyroid cartilage or other laryngeal cartilage destruction is evident. Salivary glands: Negative. Thyroid: Within normal limits. Lymph nodes: Mild asymmetric enlargement of the left level 2 and level 3 lymph nodes, but these maintain a physiologic fatty hilum (series 3, image 42). Other lymph node stations appear normal. No cystic or necrotic nodes. Vascular: Suboptimal intravascular contrast bolus, but the major vascular structures in the neck and at the skull base appear to remain patent. Limited intracranial: Negative. Visualized orbits: Negative. Mastoids and visualized paranasal sinuses: Clear. Skeleton: Occasional carious dentition. Cervical spine degeneration. No acute osseous abnormality identified. Upper chest: Mediastinal lipomatosis. Small right paratracheal lymph nodes. No superior mediastinal inflammation is evident. Negative lung apices. IMPRESSION: 1. A left lateral supraglottic laryngeal Abscess is suspected (sagittal image 54) with pockets of low-density fluid and gas subjacent to the left thyroid cartilage. This measures up to 2.8 cm. No cartilage destruction. I considered but do not suspect that this is simply trapped fluid and gas in the left piriform sinus. 2. Otherwise there is only a small volume of gas and fluid located within the inferior tracheostomy incision site (sagittal image 45). 3. Mild reactive appearing left level II/III and right paratracheal lymph nodes. 4. Otherwise negative neck CT. Electronically Signed: By: Genevie Ann M.D. On: 12/10/2017 10:48    Scheduled Meds: . buPROPion  100 mg Oral BID  . chlorhexidine  15 mL Mouth Rinse BID  . cholestyramine  4 g Oral QODAY  . clindamycin  450 mg Oral  Q8H  . enoxaparin (LOVENOX) injection  70 mg Subcutaneous Q24H  . gabapentin  300 mg Oral BID  . insulin aspart  0-20 Units Subcutaneous TID WC  . insulin aspart  6 Units Subcutaneous TID WC  . insulin glargine  38 Units Subcutaneous QHS  . levothyroxine  75 mcg Oral QAC breakfast  . mouth rinse  15 mL Mouth Rinse q12n4p  . metoprolol tartrate  12.5 mg Oral BID  . pantoprazole  40 mg Oral BID AC  . protein supplement shake  11 oz Oral BID BM  . Racepinephrine HCl  0.5 mL Nebulization Once   Continuous Infusions:   LOS: 21 days   Time spent: 35 minutes.  Vance Gather, MD [facility] Pager [phone]  If 7PM-7AM, please contact night-coverage [email or web address] Password [facility] 12/11/2017, 5:42 PM

## 2017-12-11 NOTE — Progress Notes (Signed)
Physical Therapy Treatment Patient Details Name: TEYONA NICHELSON MRN: [id number] DOB: October 18, 1975 Today's Date: 12/11/2017    History of Present Illness Patient is a 42 y/o female who presents with drug OD and Sz. Intubated 3/13-3/15. resp failure with emergent trach 3/21 due to vocal cord dysfunction. CT of throat- suggests left paralaryngeal abscess. PMH includes ORIF RLE, depression, anxiety, morbid obesity, DM, Cushing's, Rt uni knee replacement, HTN, left ankle surgery, dyslipidemia, hypothyroidism.     PT Comments    Patient progressing well towards PT goals. Reports HA today and pain in trach site. Anxious to move due to trach site pain. + secretions with mobility. HR up to 146 bpm during mobility and Sp02 dropped to 88% on 35% TC. Encouraged mobility with sitter. Pt seems to be in poor spirits today. Would really benefit from getting outside. Will follow.    Follow Up Recommendations  Supervision for mobility/OOB;Other (comment)([facility])     Equipment Recommendations  Rolling walker with 5" wheels    Recommendations for Other Services       Precautions / Restrictions Precautions Precautions: Fall Precaution Comments: tachycardia; suicide precautions, trach Restrictions Weight Bearing Restrictions: No    Mobility  Bed Mobility               General bed mobility comments: Pt was OOB in the recliner chair.   Transfers Overall transfer level: Needs assistance Equipment used: Rolling walker (2 wheeled) Transfers: Sit to/from Stand Sit to Stand: Min guard         General transfer comment: min guard assist for safety. Stood from Youth worker.  Ambulation/Gait Ambulation/Gait assistance: Min guard Ambulation Distance (Feet): 200 Feet Assistive device: Rolling walker (2 wheeled) Gait Pattern/deviations: Step-through pattern;Decreased stride length;Wide base of support Gait velocity: decreased   General Gait Details: Slow, steady gait with 2/4 DOE. On 35% TC, 1 standing  rest break. HR up to 146 bpm. Sp02 stayed >88%.   Stairs            Wheelchair Mobility    Modified Rankin (Stroke Patients Only)       Balance Overall balance assessment: Needs assistance Sitting-balance support: Feet supported;No upper extremity supported Sitting balance-Leahy Scale: Good Sitting balance - Comments: Able to reach down and adjust socks, no LOB.   Standing balance support: During functional activity Standing balance-Leahy Scale: Fair                              Cognition Arousal/Alertness: Awake/alert Behavior During Therapy: WFL for tasks assessed/performed Overall Cognitive Status: Within Functional Limits for tasks assessed                                        Exercises      General Comments General comments (skin integrity, edema, etc.): Spouse present.      Pertinent Vitals/Pain Pain Assessment: Faces Faces Pain Scale: Hurts even more Pain Descriptors / Indicators: Headache Pain Intervention(s): Monitored during session;Repositioned    Home Living                      Prior Function            PT Goals (current goals can now be found in the care plan section) Progress towards PT goals: Progressing toward goals    Frequency    Min 3X/week  PT Plan Current plan remains appropriate    Co-evaluation              AM-PAC PT "6 Clicks" Daily Activity  Outcome Measure  Difficulty turning over in bed (including adjusting bedclothes, sheets and blankets)?: None Difficulty moving from lying on back to sitting on the side of the bed? : None Difficulty sitting down on and standing up from a chair with arms (e.g., wheelchair, bedside commode, etc,.)?: None Help needed moving to and from a bed to chair (including a wheelchair)?: None Help needed walking in hospital room?: A Little Help needed climbing 3-5 steps with a railing? : A Little 6 Click Score: 22    End of Session  Equipment Utilized During Treatment: Oxygen Activity Tolerance: Treatment limited secondary to medical complications (Comment)(tachycardia) Patient left: in chair;with call bell/phone within reach;with nursing/sitter in room;with family/visitor present Nurse Communication: Mobility status PT Visit Diagnosis: Muscle weakness (generalized) (M62.81);Unsteadiness on feet (R26.81);Difficulty in walking, not elsewhere classified (R26.2)     Time: 4696-2952 PT Time Calculation (min) (ACUTE ONLY): 27 min  Charges:  $Gait Training: 8-22 mins $Therapeutic Exercise: 8-22 mins                    G Codes:       Wray Kearns, PT, DPT [phone]     Marguarite Arbour A Tamana Hatfield 12/11/2017, 1:39 PM

## 2017-12-11 NOTE — Progress Notes (Signed)
  Speech Language Pathology Treatment:    Patient Details Name: AMERY VANDENBOS MRN: [id number] DOB: March 27, 1976 Today's Date: 12/11/2017 Time: 6759-1638 SLP Time Calculation (min) (ACUTE ONLY): 14 min  Assessment / Plan / Recommendation Clinical Impression  Results of laryngoscopy noted. Pt with excellent toleration of PMV.  Maintained stable VS throughout use; achieving strong voice.  No air trapping upon removal of valve.  Pt instructed how to apply and remove valve.  Able to do so independently in front of mirror and then while seated without mirror.  Recommend pt use valve all waking hours; remove at bedtime.  Pt verbalizes and demonstrates understanding.  SLP will continue to follow for swallow/PMV.    HPI HPI: Patient is a 42 y/o female who presents with drug OD and Sz. Intubated 3/13-3/15. resp failure with emergent trach 3/21.  ENT flexible laryngoscopy revealed posterior supraglottic swelling; swollen vocal folds with no abduction; edematous arytenoids; entire subglottis circumferentially swollen with necrotic debris and fibrinous exudate.       SLP Plan  Continue with current plan of care       Recommendations         Patient may use Passy-Muir Speech Valve: During all waking hours (remove during sleep) PMSV Supervision: Intermittent         Oral Care Recommendations: Oral care BID SLP Visit Diagnosis: Aphonia (R49.1) Plan: Continue with current plan of care       GO                [name] 12/11/2017, 3:44 PM

## 2017-12-12 LAB — GLUCOSE, CAPILLARY
GLUCOSE-CAPILLARY: 205 mg/dL — AB (ref 65–99)
Glucose-Capillary: 199 mg/dL — ABNORMAL HIGH (ref 65–99)
Glucose-Capillary: 167 mg/dL — ABNORMAL HIGH (ref 65–99)
Glucose-Capillary: 151 mg/dL — ABNORMAL HIGH (ref 65–99)

## 2017-12-12 MED ORDER — PRO-STAT SUGAR FREE PO LIQD
30.0000 mL | Freq: Two times a day (BID) | ORAL | Status: DC
  Administered 2017-12-12 – 2018-01-07 (×45): 30 mL via ORAL
  Filled 2017-12-12 (×50): qty 30

## 2017-12-12 MED ORDER — PIPERACILLIN-TAZOBACTAM 3.375 G IVPB
3.3750 g | Freq: Three times a day (TID) | INTRAVENOUS | Status: DC
  Administered 2017-12-13 – 2017-12-18 (×17): 3.375 g via INTRAVENOUS
  Filled 2017-12-12 (×21): qty 50

## 2017-12-12 MED ORDER — SUMATRIPTAN SUCCINATE 50 MG PO TABS
50.0000 mg | ORAL_TABLET | ORAL | Status: AC | PRN
  Administered 2017-12-12 (×2): 50 mg via ORAL
  Filled 2017-12-12 (×3): qty 1

## 2017-12-12 MED ORDER — PIPERACILLIN-TAZOBACTAM 3.375 G IVPB 30 MIN
3.3750 g | Freq: Once | INTRAVENOUS | Status: AC
  Administered 2017-12-12: 3.375 g via INTRAVENOUS
  Filled 2017-12-12: qty 50

## 2017-12-12 MED ORDER — SUMATRIPTAN SUCCINATE 50 MG PO TABS
50.0000 mg | ORAL_TABLET | ORAL | Status: DC | PRN
  Filled 2017-12-12: qty 1

## 2017-12-12 NOTE — Progress Notes (Addendum)
Nutrition Follow-up  DOCUMENTATION CODES:   Morbid obesity  INTERVENTION:  Pro-stat 50mL BID each supplement provides 100 calories and 15 grams of protein  Provide milk with all trays  NUTRITION DIAGNOSIS:   Increased nutrient needs related to acute illness as evidenced by estimated needs. -ongoing  GOAL:   Patient will meet greater than or equal to 90% of their needs -meeting with PO intake  MONITOR:   PO intake, I & O's, Weight trends, Labs, Supplement acceptance   ASSESSMENT:   42 yo female admitted with acute toxic encephalopathy secondary to intentional OD on baclofan, trazadone and  tramadol, seizures. Pt intubate for airway protection. Pt with hx of Cushing's, DM, OSA  Spoke with patient this morning. She had eggs, potatoes and 1/2 a banana. Wrote down things on a white board to respond to RD. Appetite is ok but she continues to eat food. Switched her to pro-stat for wound healing. She did not like premier protein. Explained to her rational behind pro-stat, encouraged her to consume it and asked her if that was ok with her. She was open to it. RD will continue to monitor.  Labs reviewed Medications reviewed and include:  Insulin, Questran  Diet Order:  DIET DYS 3 Room service appropriate? Yes; Fluid consistency: Thin  EDUCATION NEEDS:   Not appropriate for education at this time  Skin:  Skin Assessment: Skin Integrity Issues: Skin Integrity Issues:: Other (Comment) Other: MASD: abdomen, bilateral groin, under breasts  Last BM:  12/11/2017  Height:   Ht Readings from Last 1 Encounters:  12/08/17 5\' 2"  (1.575 m)    Weight:   Wt Readings from Last 1 Encounters:  12/12/17 (!) 309 lb 11.9 oz (140.5 kg)    Ideal Body Weight:  52.3 kg  BMI:  Body mass index is 56.65 kg/m.  Estimated Nutritional Needs:   Kcal:  1832-2200 calories (ABW x25-30)  Protein:  115-131 grams  Fluid:  1.8-2.2L  Satira Anis. Neely Kammerer, MS, RD LDN Inpatient Clinical  Dietitian Pager [phone]

## 2017-12-12 NOTE — Progress Notes (Signed)
Occupational Therapy Treatment Patient Details Name: [name] [name] MRN: [id number] DOB: [birth year] Today's Date: 12/12/2017    History of present illness Patient is a 42 y/o female who presents with drug OD and Sz. Intubated 3/13-3/15. resp failure with emergent trach 3/21 due to vocal cord dysfunction. CT of throat- suggests left paralaryngeal abscess. PMH includes ORIF RLE, depression, anxiety, morbid obesity, DM, Cushing's, Rt uni knee replacement, HTN, left ankle surgery, dyslipidemia, hypothyroidism.    OT comments  Pt complaining of difficulty with breathing today. Nsg made aware. Vitals stable during session. Pt participating in grooming tasks in seated position with set up. Pt talked about 2 things that she really enjoyed which were crafts and spending time with her children. States her 5 yo son is autistic and she feels overwhelmed at times with his care. Feels she gets limited support from her husband. Smiles when she talks about her 32 yo daughter, [name] [name]. Pt with brighter affect and engaging with therapist. Will continue to follow acutely and recommend follow up with OT at behavioral health.   Follow Up Recommendations  Other (comment)(Behavioral Health)    Equipment Recommendations  Other (comment)(TBA closer to [place])    Recommendations for Other Services      Precautions / Restrictions Precautions Precautions: Fall Precaution Comments: tachycardia; suicide precautions, trach       Mobility Bed Mobility               General bed mobility comments: Pt was OOB in the recliner chair.   Transfers       Sit to Stand: Min guard              Balance     Sitting balance-Leahy Scale: Good       Standing balance-Leahy Scale: Fair                             ADL either performed or assessed with clinical judgement   ADL Overall ADL's : Needs assistance/impaired   Eating/Feeding Details (indicate cue type and reason): Pt feeding self without  difficulty - soft diet Grooming: Set up;Sitting                               Functional mobility during ADLs: Min guard General ADL Comments: sit - stand      Vision       Perception     Praxis      Cognition Arousal/Alertness: Awake/alert Behavior During Therapy: WFL for tasks assessed/performed Overall Cognitive Status: Within Functional Limits for tasks assessed                                 General Comments: appears WFL for basic ADLs         Exercises     Shoulder Instructions       General Comments      Pertinent Vitals/ Pain       Pain Assessment: Faces Faces Pain Scale: Hurts little more Pain Location: (discomfort with breathing) Pain Descriptors / Indicators: Discomfort Pain Intervention(s): Limited activity within patient's tolerance  Home Living  Prior Functioning/Environment              Frequency  Min 2X/week        Progress Toward Goals  OT Goals(current goals can now be found in the care plan section)  Progress towards OT goals: Progressing toward goals  Acute Rehab OT Goals Patient Stated Goal: to get stronger  OT Goal Formulation: With patient Time For Goal Achievement: 12/18/17 Potential to Achieve Goals: Good ADL Goals Pt Will Perform Grooming: with supervision;standing Pt Will Perform Upper Body Bathing: with set-up;with supervision;sitting Pt Will Perform Lower Body Bathing: with min guard assist;with adaptive equipment;sit to/from stand Pt Will Transfer to Toilet: with supervision;ambulating;regular height toilet;grab bars Pt Will Perform Toileting - Clothing Manipulation and hygiene: with min guard assist;sit to/from stand Pt/caregiver will Perform Home Exercise Program: Increased strength;Both right and left upper extremity;With theraband;With written HEP provided;With Supervision  Plan Discharge plan remains appropriate;Frequency  needs to be updated    Co-evaluation                 AM-PAC PT "6 Clicks" Daily Activity     Outcome Measure   Help from another person eating meals?: None Help from another person taking care of personal grooming?: A Little Help from another person toileting, which includes using toliet, bedpan, or urinal?: A Little Help from another person bathing (including washing, rinsing, drying)?: A Lot Help from another person to put on and taking off regular upper body clothing?: A Little Help from another person to put on and taking off regular lower body clothing?: A Lot 6 Click Score: 17    End of Session Equipment Utilized During Treatment: Oxygen  OT Visit Diagnosis: Muscle weakness (generalized) (M62.81)   Activity Tolerance Patient tolerated treatment well   Patient Left in chair;with call bell/phone within reach;with nursing/sitter in room   Nurse Communication Other (comment)(pt's concerns over her respiratory status)        Time: 4034-7425 OT Time Calculation (min): 17 min  Charges: OT General Charges $OT Visit: 1 Visit OT Treatments $Therapeutic Activity: 8-22 mins  [facility], OT/L  [phone] 12/12/2017   Kenlynn Houde,HILLARY 12/12/2017, 5:54 PM

## 2017-12-12 NOTE — Consult Note (Addendum)
[place] Nurse wound consult note Reason for Consult: Re-consult requested for trach wounds, to provide topical treatment recommendations.  Consult was performed on 4/1 by [place] team, and 4/4 by ENT to evaluate the locations.  Refer to their progress notes; they feel the necrotic sites are related to an infectious process.   Wound type: Left side of trach with full thickness wounds; 2X1.5cm, patchy areas of yellow slough, mod amt tan drainage, painful to touch. Right side of trach with full thickness wounds; 2X.3cm, patchy areas of yellow slough, mod amt tan drainage, painful to touch. Middle of trach, 1X1X.3cm, located below stoma is dark brown slough, some odor, mod amt tan drainage, painful to touch. Appearance of these wounds is NOT consistent with medical device pressure injuries, but the trach face plate is contributing to impairment of healing the infected wounds related to constant pressure.  It is difficult to reduce this related to the locations of the wounds and the patient's large neck size. There is also a large amt thick brown drainage from the trach which is constantly leaking into the infected wounds and it is difficult to keep them clean and dry to promote healing.  Dressing procedure/placement/frequency: Aquacel to absorb drainage and provide antimicrobial benefits.  Foam dressing to reduce pressure from trach face plate and attempt to keep the wounds dry. Instructions for topical treatment orders provided for the bedside nurses to perform daily. Discussed plan of care with patient and she verbalized understanding. Please re-consult if further assistance is needed.  Thank-you,  Julien Girt MSN, [place], [place], [place], [place]

## 2017-12-12 NOTE — Progress Notes (Addendum)
PROGRESS NOTE  [name]  JOI:[id number] DOB: 02/27/1976 DOA: 11/20/2017 PCP: Sandi Mariscal, MD   Brief Narrative: [name] is a 42 y.o. female with a history of morbid obesity (BMI 41), IDDM, OSA, HTN, Cushing's disease and depression who presented 3/13 after suicide attempt by intentional overdose of baclofen, trazodone, and tramadol. She reportedly was encephalopathic and experienced a seizure, intubated for airway protection and admitted to the ICU. After extubation she developed stridor initially responsive to racemic epinephrine with laryngoscopy revealing fixed vocal cords, though when this recurred 3/21 it was unresponsive necessitating tracheostomy with laryngoscopy revealing circumferential subglottic swelling with necrotic debris and fibrinous exudate. Respiratory status has stabilized, though she continued to have significant secretions with odor. Clindamycin was started 3/27 and ENT reevaluated the patient 4/2 due to CT showing left paralaryngeal abscess. Flexible laryngoscopy was repeated showing pooled secretions in pharynx, and inability to assess vocal cord mobility or subglottic patency due to poor laryngeal airway. Psychiatry has continued to recommend transfer to inpatient psychiatry for suicidal ideation and attempt when medically stable.  Assessment & Plan: Principal Problem:   Severe episode of recurrent major depressive disorder, without psychotic features ([place]) Active Problems:   Intentional overdose of drug in tablet form ([place])   Status epilepticus ([place])   Shortness of breath   Hoarse voice quality   Hypomagnesemia   Acute respiratory failure with hypoxemia (H[facility])   SOB (shortness of breath)   Tracheostomy status ([facility])  Acute respiratory failure secondary to glottic obstruction, fixed vocal cords s/p intubation: s/p tracheostomy 11/28/17 with laryngoscopy 3/19 showing posterior supraglottic swelling. CT neck showed left lateral supraglottic laryngeal fluid collection  thought to be abscess with reactive appearing left level II/III and right paratracheal lymph nodes. ENT re-consulted performing laryngoscopy again 4/2 by Dr. Erik Obey showed significant laryngeal edema precluding visualization of vocal cords/subglottic patency but no abscess.  - Continue trach collar, PMV as tolerated - ENT following patient, planning repeat laryngoscopy evaluation next week.  - WOC and nutrition consulted for wound care instructions and optimizing nutritional status for wound healing.  - Respiratory culture 3/23 consistent with respiratory flora; repeat 3/30 grew moderate group C streptococcus and unspeciated rare GNRs. Will change clindamycin to zosyn to additionally cover GNRs.  Headache: Possibly related to OD, current medications, depression, TMJ. No red flags or indications for imaging.  - Continue percocet, fentanyl prn severe pain, ibuprofen prn headache. Trial triptan prn refractory headache. Still plan to deescalate fentanyl as able over the next couple days.   Intentional overdose with suicidal ideation, depression: To inpatient psychiatry hospital when medically stable.  - Resumed wellbutrin, gabapentin, trazodone.  - Continue sitter, suicide precautions - Appreciate psychiatry consultation. Due to the patient's expected protracted hospitalization here prior to transfer, I have asked for serial consultations.   Acute kidney injury: Resolved. Monitor with reinitiation of NSAID.   Diabetes mellitus, type II with hyperglycemia: Last HbA1c 7.2%, possibly some hyperglycemia from steroids. Near inpatient goal. - Continue Lantus 38 units at bedtime [home dose 28 70/30 twice daily], 6u mealtime, and resistant SSI - Holding metformin 1000 twice daily, Actos 15 daily   Hypomagnesemia: Resolved with repletion.   Seizure disorder: Stable, no activity.  - Continue gabapentin, and, since no seizures and has been on wellbutrin will continue this. Suspect neurovegetative  depression is compelling indication in this setting.   Hypothyroidism: TSH 3.587 - Continue synthroid   Lower extremity swelling:  - R/o DVT with U/S  Acute toxic encephalopathy: Due to OD.  UDS and CT head negative. Resolved.   Acute pulmonary edema: Resolved with lasix.   Shortness of breath and chest pain: Suspect anxiety component. Resolved with trach placement. Workup at time chest x-ray, EKG, troponin, d-dimer unremarkable. Resolved.  Hypotension, sinus tachycardia - Transitioned 3/29 to metoprolol 12.5 twice daily. Stable.  DVT prophylaxis: Lovenox Code Status: Full Family Communication: Husband at bedside Disposition Plan: Inpatient psychiatry when medically stable. Still having tracheostomy issues precluding transfer. Will repeat laryngoscopy next week. Uncertain expected date of stability.   Consultants:   CCM  Psychiatry  ENT  Procedures:  Intubation/extubation Renal US Transnasal fiberoptic laryngoscopy x 2 Tracheostomy and direct laryngoscopy   Antimicrobials:  Clindamycin 3/26 - 4/4  Zosyn 4/4 >>   Subjective: Mood stable, still having significant secretions from trach. No improvement in headache with ordered medications. Still no visual deficits, changes, photophobia, nausea or vomiting.    Objective: Vitals:   12/12/17 0700 12/12/17 1200 12/12/17 1217 12/12/17 1532  BP:  (!) 96/48    Pulse: 90  87 73  Resp:  20 (!) 22   Temp:  98.1 F (36.7 C)    TempSrc:  Oral    SpO2: 98%  100% 100%  Weight:      Height:        Intake/Output Summary (Last 24 hours) at 12/12/2017 1632 Last data filed at 12/11/2017 2100 Gross per 24 hour  Intake 640 ml  Output -  Net 640 ml   Filed Weights   12/10/17 0456 12/11/17 0500 12/12/17 0421  Weight: (!) 142.1 kg (313 lb 4.4 oz) (!) 140.5 kg (309 lb 11.9 oz) (!) 140.5 kg (309 lb 11.9 oz)    Gen: Morbidly obese 42 y.o. female in no distress. Cushingoid habitus. Neck: Trach collar with malodorous thick  brown secretions running down neck to chest. Difficult to appreciate LNs.  Pulm: Non-labored breathing through trach, tachypneic, no wheezing or stridor. Clear to auscultation bilaterally.  CV: Regular rate and rhythm. No murmur, rub, or gallop. Unable to appreciate JVD, trace bilateral pedal edema. GI: Abdomen soft, non-tender, non-distended, with normoactive bowel sounds. No organomegaly or masses felt. Ext: Warm, no deformities Skin: No wounds.  Neuro: Alert and oriented. [place] without ability to phonate, communicates with dry erase board. No focal neurological deficits. Psych: Very depressed with restricted affect.   CBC: Recent Labs  Lab 12/06/17 0758 12/08/17 0307 12/09/17 0329 12/10/17 0330  WBC 15.0* 11.9* 10.1 9.6  NEUTROABS 11.2* 8.6* 6.4 6.7  HGB 13.7 12.0 11.7* 10.8*  HCT 41.8 37.0 37.1 34.7*  MCV 88.9 87.9 90.0 89.7  PLT 570* 512* 451* 700*   Basic Metabolic Panel: Recent Labs  Lab 12/06/17 0758 12/07/17 0617 12/07/17 0826 12/08/17 0307 12/09/17 0329 12/10/17 0330  NA 135 130* 126* 130* 133* 136  K 3.5 5.6* 5.0 4.0 4.2 4.5  CL 97* 94* 93* 96* 98* 100*  CO2 24 22 19* 22 25 26   GLUCOSE 90 200* 377* 126* 206* 171*  BUN 31* 36* 36* 33* 26* 18  CREATININE 1.01* 1.55* 1.56* 1.31* 1.05* 0.74  CALCIUM 9.1 9.0 8.6* 8.8* 8.7* 8.9  MG  --  1.9  --  1.8 2.0  --   PHOS 3.4 4.1  --  3.8 3.4 3.3   Liver Function Tests: Recent Labs  Lab 12/06/17 0758 12/07/17 0617 12/08/17 0307 12/09/17 0329 12/10/17 0330  ALBUMIN 2.5* 2.5* 2.4* 2.3* 2.0*   CBG: Recent Labs  Lab 12/11/17 1136 12/11/17 1703 12/11/17 2138 12/12/17 1749 12/12/17  [place]   Urine analysis:    Component Value Date/Time   COLORURINE RED (A) 10/31/2016 2130   APPEARANCEUR CLOUDY (A) 10/31/2016 2130   LABSPEC 1.027 10/31/2016 2130   PHURINE 6.0 10/31/2016 2130   GLUCOSEU >=500 (A) 10/31/2016 2130   HGBUR LARGE (A) 10/31/2016 2130   BILIRUBINUR NEGATIVE  10/31/2016 2130   KETONESUR NEGATIVE 10/31/2016 2130   PROTEINUR 100 (A) 10/31/2016 2130   UROBILINOGEN 0.2 04/09/2014 1157   NITRITE NEGATIVE 10/31/2016 2130   LEUKOCYTESUR NEGATIVE 10/31/2016 2130   Recent Results (from the past 240 hour(s))  Culture, expectorated sputum-assessment     Status: None   Collection Time: 12/07/17  5:18 AM  Result Value Ref Range Status   Specimen Description EXPECTORATED SPUTUM  Final   Special Requests NONE  Final   Sputum evaluation   Final    THIS SPECIMEN IS ACCEPTABLE FOR SPUTUM CULTURE Performed at [facility], [place] [address] [place], [place] [address]    Report Status 12/09/2017 FINAL  Final  Culture, respiratory (NON-Expectorated)     Status: None   Collection Time: 12/07/17  5:18 AM  Result Value Ref Range Status   Specimen Description EXPECTORATED SPUTUM  Final   Special Requests NONE Reflexed from [id number]  Final   Gram Stain   Final    FEW WBC PRESENT, PREDOMINANTLY PMN FEW GRAM POSITIVE COCCI RARE GRAM NEGATIVE RODS Performed at [facility], [place] [address], [place], [place] [address]    Culture MODERATE STREPTOCOCCUS GROUP C  Final   Report Status 12/09/2017 FINAL  Final      Radiology Studies: No results found.  Scheduled Meds: . buPROPion  100 mg Oral BID  . chlorhexidine  15 mL Mouth Rinse BID  . cholestyramine  4 g Oral QODAY  . clindamycin  450 mg Oral Q8H  . enoxaparin (LOVENOX) injection  70 mg Subcutaneous Q24H  . feeding supplement (PRO-STAT SUGAR FREE 64)  30 mL Oral BID  . gabapentin  300 mg Oral BID  . insulin aspart  0-20 Units Subcutaneous TID WC  . insulin aspart  6 Units Subcutaneous TID WC  . insulin glargine  40 Units Subcutaneous QHS  . levothyroxine  75 mcg Oral QAC breakfast  . mouth rinse  15 mL Mouth Rinse q12n4p  . metoprolol tartrate  12.5 mg Oral BID  . pantoprazole  40 mg Oral BID AC  . Racepinephrine HCl  0.5 mL Nebulization Once   Continuous Infusions:   LOS: 22 days   Time  spent: 35 minutes.  Vance Gather, MD [facility] Pager [phone]  If 7PM-7AM, please contact night-coverage [email or web address] Password [facility] 12/12/2017, 4:32 PM

## 2017-12-12 NOTE — Progress Notes (Addendum)
12/12/2017 9:57 AM  [name], [name] [id number]      Temp:  [98.1 F (36.7 C)-98.7 F (37.1 C)] 98.7 F (37.1 C) (04/04 0556) Pulse Rate:  [74-100] 90 (04/04 0700) Resp:  [16-24] 22 (04/04 0556) BP: (101-118)/(57-72) 112/59 (04/04 0556) SpO2:  [92 %-100 %] 98 % (04/04 0700) FiO2 (%):  [30 %-35 %] 30 % (04/04 0700) Weight:  [140.5 kg (309 lb 11.9 oz)] 140.5 kg (309 lb 11.9 oz) (04/04 0421),     Intake/Output Summary (Last 24 hours) at 12/12/2017 0957 Last data filed at 12/11/2017 2100 Gross per 24 hour  Intake 880 ml  Output -  Net 880 ml    Results for orders placed or performed during the hospital encounter of 11/20/17 (from the past 24 hour(s))  Glucose, capillary     Status: Abnormal   Collection Time: 12/11/17 11:36 AM  Result Value Ref Range   Glucose-Capillary 174 (H) 65 - 99 mg/dL  Glucose, capillary     Status: Abnormal   Collection Time: 12/11/17  5:03 PM  Result Value Ref Range   Glucose-Capillary 167 (H) 65 - 99 mg/dL  Glucose, capillary     Status: Abnormal   Collection Time: 12/11/17  9:38 PM  Result Value Ref Range   Glucose-Capillary 184 (H) 65 - 99 mg/dL   Comment 1 Notify RN    Comment 2 Document in Chart   Glucose, capillary     Status: Abnormal   Collection Time: 12/12/17  6:08 AM  Result Value Ref Range   Glucose-Capillary 199 (H) 65 - 99 mg/dL    SUBJECTIVE:   Still painful and odorous trach wound  OBJECTIVE:  Soupy exudate in necrotic trach wound.  IMPRESSION:  Subglottic stenosis, current status uncertain.  Trach wound infection/necrosis.  PLAN:   Consider wound consult.  Will have nurses do iodoform nugauze dressing changes.  Consider broadening abx coverage beyond Clindamycin.  Eventually will ask Dr. Constance Holster to repeat laryngoscopy, probably under anesthesia, to ascertain status of airway and help with decision making regarding trach, possible decannulation, possible transfer to inpatient psychiatric care.  Consider overall malnutrition as  contributor to trach wound necrosis.  [name]

## 2017-12-12 NOTE — Progress Notes (Signed)
Pharmacy Antibiotic Note  [name] is a 42 y.o. female admitted on 11/20/2017 with wound infection.  Pharmacy has been consulted for Zosyn dosing.  Patient previously on clindamycin for laryngeal abscess- broadening coverage to have better coverage for GNR.  Good renal function; WBC now normal, afebrile.  Plan: Zosyn 3.375g IV x1 now over 30 minutes, then 3.375g IV q8h EI Follow c/s, LOT, clinical progression  Height: 5\' 2"  (157.5 cm) Weight: (!) 309 lb 11.9 oz (140.5 kg) IBW/kg (Calculated) : 50.1  Temp (24hrs), Avg:98.2 F (36.8 C), Min:97.9 F (36.6 C), Max:98.7 F (37.1 C)  Recent Labs  Lab 12/06/17 0758 12/07/17 0617 12/07/17 0826 12/08/17 0307 12/09/17 0329 12/10/17 0330  WBC 15.0*  --   --  11.9* 10.1 9.6  CREATININE 1.01* 1.55* 1.56* 1.31* 1.05* 0.74    Estimated Creatinine Clearance: 123.5 mL/min (by C-G formula based on SCr of 0.74 mg/dL).    Allergies  Allergen Reactions  . Penicillins Hives    Has patient had a PCN reaction causing immediate rash, facial/tongue/throat swelling, SOB or lightheadedness with hypotension: Yes Has patient had a PCN reaction causing severe rash involving mucus membranes or skin necrosis: Yes Has patient had a PCN reaction that required hospitalization No Has patient had a PCN reaction occurring within the last 10 years: No If all of the above answers are "NO", then may proceed with Cephalosporin use.     Clindamycin 3/26 >> 4/4 Zosyn 4/4>>  3/21 MRSA PCR - negaive 3/23 TA - negative 3/30 sputum - Group C Strep, also with rare gram negative rods  Thank you for allowing pharmacy to be a part of this patient's care.  Adelbert Gaspard D. Cristella Stiver, PharmD, BCPS Clinical Pharmacist [phone] 12/12/2017 5:25 PM

## 2017-12-12 NOTE — Progress Notes (Signed)
  Speech Language Pathology Treatment: Dysphagia;Passy Muir Speaking valve  Patient Details Name: [name] MRN: [id number] DOB: [birth year] Today's Date: 12/12/2017 Time: 5329-9242 SLP Time Calculation (min) (ACUTE ONLY): 10 min  Assessment / Plan / Recommendation Clinical Impression  Pt seen in am after meal, tolerating meals without complaint. Foul odor of secretions noted though pt orally expectorating and using oral suction independently. Pt also placed PMSV upon need to communicate with SLP, though min verbal cues needed to aid pt in placing successfully and pt verbalized difficulty figuring it out. SLP provided cues to use mirror on table to help and pt return demonstrated use. Vocal quality dysphonic/aphonic, pt prefers not to phonate because it makes her cough. After 5 minutes of PMSV placement pt said "sometimes if get hard to wear this" and upon pt self removal plosive puff of air noted indicaitng mild air trapping. Given that pt is independent in placement and removal and is aware of sensation of possible trapping, no changes needed in use. Pt also able to consume thin liquids without signs of aspiration and is satisfied with diet texture. Will sign off for dysphagia and plan to f/u 1x a week for maintenance use of PMSV  And vocal training while admitted.   HPI HPI: Patient is a 42 y/o female who presents with drug OD and Sz. Intubated 3/13-3/15. resp failure with emergent trach 3/21.  ENT flexible laryngoscopy revealed posterior supraglottic swelling; swollen vocal folds with no abduction; edematous arytenoids; entire subglottis circumferentially swollen with necrotic debris and fibrinous exudate.       SLP Plan  Continue with current plan of care       Recommendations  Diet recommendations: Dysphagia 3 (mechanical soft);Thin liquid Liquids provided via: Cup Medication Administration: Whole meds with liquid Supervision: Patient able to self feed Compensations: Small  sips/bites Postural Changes and/or Swallow Maneuvers: Seated upright 90 degrees      Patient may use Passy-Muir Speech Valve: During all waking hours (remove during sleep) PMSV Supervision: Intermittent MD: Please consider changing trach tube to : Smaller size;Cuffless         Oral Care Recommendations: Oral care BID Follow up Recommendations: Skilled Nursing facility SLP Visit Diagnosis: Aphonia (R49.1) Plan: Continue with current plan of care       GO              [facility], [place] [name] [phone]  [name] 12/12/2017, 8:45 AM

## 2017-12-13 ENCOUNTER — Inpatient Hospital Stay (HOSPITAL_COMMUNITY): Payer: Medicaid Other

## 2017-12-13 DIAGNOSIS — F17210 Nicotine dependence, cigarettes, uncomplicated: Secondary | ICD-10-CM

## 2017-12-13 DIAGNOSIS — Z818 Family history of other mental and behavioral disorders: Secondary | ICD-10-CM

## 2017-12-13 DIAGNOSIS — R51 Headache: Secondary | ICD-10-CM

## 2017-12-13 DIAGNOSIS — M549 Dorsalgia, unspecified: Secondary | ICD-10-CM

## 2017-12-13 DIAGNOSIS — Z810 Family history of intellectual disabilities: Secondary | ICD-10-CM

## 2017-12-13 DIAGNOSIS — F1290 Cannabis use, unspecified, uncomplicated: Secondary | ICD-10-CM

## 2017-12-13 DIAGNOSIS — R609 Edema, unspecified: Secondary | ICD-10-CM

## 2017-12-13 DIAGNOSIS — G4700 Insomnia, unspecified: Secondary | ICD-10-CM

## 2017-12-13 DIAGNOSIS — M79609 Pain in unspecified limb: Secondary | ICD-10-CM

## 2017-12-13 DIAGNOSIS — M7989 Other specified soft tissue disorders: Secondary | ICD-10-CM

## 2017-12-13 LAB — BASIC METABOLIC PANEL
ANION GAP: 8 (ref 5–15)
BUN: 15 mg/dL (ref 6–20)
CHLORIDE: 102 mmol/L (ref 101–111)
CO2: 26 mmol/L (ref 22–32)
Calcium: 9.1 mg/dL (ref 8.9–10.3)
Creatinine, Ser: 0.85 mg/dL (ref 0.44–1.00)
Glucose, Bld: 205 mg/dL — ABNORMAL HIGH (ref 65–99)
POTASSIUM: 5.8 mmol/L — AB (ref 3.5–5.1)
Sodium: 136 mmol/L (ref 135–145)

## 2017-12-13 LAB — GLUCOSE, CAPILLARY
GLUCOSE-CAPILLARY: 194 mg/dL — AB (ref 65–99)
GLUCOSE-CAPILLARY: 199 mg/dL — AB (ref 65–99)
GLUCOSE-CAPILLARY: 204 mg/dL — AB (ref 65–99)
GLUCOSE-CAPILLARY: 173 mg/dL — AB (ref 65–99)

## 2017-12-13 LAB — CBC
HCT: 34.2 % — ABNORMAL LOW (ref 36.0–46.0)
HEMOGLOBIN: 10.8 g/dL — AB (ref 12.0–15.0)
MCH: 28.7 pg (ref 26.0–34.0)
MCHC: 31.6 g/dL (ref 30.0–36.0)
MCV: 91 fL (ref 78.0–100.0)
Platelets: 413 10*3/uL — ABNORMAL HIGH (ref 150–400)
RBC: 3.76 MIL/uL — AB (ref 3.87–5.11)
RDW: 13.4 % (ref 11.5–15.5)
WBC: 8.3 10*3/uL (ref 4.0–10.5)

## 2017-12-13 LAB — POTASSIUM: Potassium: 5.8 mmol/L — ABNORMAL HIGH (ref 3.5–5.1)

## 2017-12-13 MED ORDER — FUROSEMIDE 10 MG/ML IJ SOLN
40.0000 mg | Freq: Once | INTRAMUSCULAR | Status: AC
  Administered 2017-12-13: 40 mg via INTRAVENOUS
  Filled 2017-12-13: qty 4

## 2017-12-13 MED ORDER — SODIUM CHLORIDE 0.9% FLUSH
10.0000 mL | INTRAVENOUS | Status: DC | PRN
  Administered 2017-12-13: 10 mL
  Administered 2017-12-27: 20 mL
  Administered 2017-12-27 – 2017-12-31 (×2): 10 mL
  Filled 2017-12-13 (×3): qty 40

## 2017-12-13 NOTE — Plan of Care (Signed)
  Problem: Spiritual Needs Goal: Ability to function at adequate level Outcome: Progressing   Problem: Education: Goal: Knowledge of General Education information will improve Outcome: Progressing   Problem: Health Behavior/Discharge Planning: Goal: Ability to manage health-related needs will improve Outcome: Progressing   Problem: Activity: Goal: Risk for activity intolerance will decrease Outcome: Progressing   Problem: Nutrition: Goal: Adequate nutrition will be maintained Outcome: Progressing   Problem: Coping: Goal: Level of anxiety will decrease Outcome: Progressing   Problem: Elimination: Goal: Will not experience complications related to bowel motility Outcome: Progressing Goal: Will not experience complications related to urinary retention Outcome: Progressing   Problem: Pain Managment: Goal: General experience of comfort will improve Outcome: Progressing   Problem: Skin Integrity: Goal: Risk for impaired skin integrity will decrease Outcome: Progressing

## 2017-12-13 NOTE — Progress Notes (Signed)
CSW following for discharge planning. Patient remains inappropriate for [facility] referral at this time; medical issues that require inpatient treatment. CSW will continue to follow for timing of medical readiness to pursue inpatient psych placement, if still appropriate when medically stable.  Laveda Abbe, [place] Clinical Social Worker [phone]

## 2017-12-13 NOTE — Progress Notes (Signed)
Physical Therapy Treatment Patient Details Name: JOWANDA HEEG MRN: [id number] DOB: [birth year] Today's Date: 12/13/2017    History of Present Illness Patient is a 42 y/o female who presents with drug OD and Sz. Intubated 3/13-3/15. resp failure with emergent trach 3/21 due to vocal cord dysfunction. CT of throat- suggests left paralaryngeal abscess. PMH includes ORIF RLE, depression, anxiety, morbid obesity, DM, Cushing's, Rt uni knee replacement, HTN, left ankle surgery, dyslipidemia, hypothyroidism.     PT Comments    Pt motivated to get up with therapy today. She ambulated 250 ft with min guard using RW. Pt states she does not feel she needs the RW but wants it for security. She is agreeable to attempting ambulation without RW next session. VVS throughout session, pt fatigued after 250 ft and required seated rest break. Will continue to follow to increase pt's activity tolerance and functional independence.    Follow Up Recommendations  Supervision for mobility/OOB;Other (comment)([facility])     Equipment Recommendations  Rolling walker with 5" wheels    Recommendations for Other Services OT consult     Precautions / Restrictions Precautions Precautions: Fall Precaution Comments: tachycardia; suicide precautions, trach Restrictions Weight Bearing Restrictions: No    Mobility  Bed Mobility               General bed mobility comments: in chair on arrival  Transfers Overall transfer level: Needs assistance Equipment used: Rolling walker (2 wheeled) Transfers: Sit to/from Stand Sit to Stand: Min guard         General transfer comment: min guard for safety and cues for hand placement  Ambulation/Gait Ambulation/Gait assistance: Min guard Ambulation Distance (Feet): 250 Feet Assistive device: Rolling walker (2 wheeled) Gait Pattern/deviations: Step-through pattern;Decreased stride length;Wide base of support Gait velocity: decreased Gait velocity interpretation: Below  normal speed for age/gender General Gait Details: slow steady gait with 1 standing rest break. SpO2 and HR all stable during ambulation.   Stairs            Wheelchair Mobility    Modified Rankin (Stroke Patients Only)       Balance Overall balance assessment: Needs assistance Sitting-balance support: Feet supported;No upper extremity supported Sitting balance-Leahy Scale: Good Sitting balance - Comments: Able to reach down and adjust socks, no LOB.   Standing balance support: During functional activity Standing balance-Leahy Scale: Fair Standing balance comment: Able to perform pericare without external support.                            Cognition Arousal/Alertness: Awake/alert Behavior During Therapy: WFL for tasks assessed/performed Overall Cognitive Status: Within Functional Limits for tasks assessed                                 General Comments: appears [facility]      Exercises      General Comments General comments (skin integrity, edema, etc.): pt with inconclusive venous study on LEs. Spoke with Dr. Bonner Puna, who verbally cleared pt for ambulation.      Pertinent Vitals/Pain Pain Assessment: Faces Faces Pain Scale: Hurts a little bit Pain Location: (discomfort with breathing) Pain Descriptors / Indicators: Discomfort Pain Intervention(s): Monitored during session;Limited activity within patient's tolerance    Home Living                      Prior Function  PT Goals (current goals can now be found in the care plan section) Acute Rehab PT Goals Patient Stated Goal: to get stronger  PT Goal Formulation: With patient Time For Goal Achievement: 12/19/17 Potential to Achieve Goals: Good Progress towards PT goals: Progressing toward goals    Frequency    Min 3X/week      PT Plan Current plan remains appropriate    Co-evaluation              AM-PAC PT "6 Clicks" Daily Activity  Outcome  Measure  Difficulty turning over in bed (including adjusting bedclothes, sheets and blankets)?: None Difficulty moving from lying on back to sitting on the side of the bed? : None Difficulty sitting down on and standing up from a chair with arms (e.g., wheelchair, bedside commode, etc,.)?: None Help needed moving to and from a bed to chair (including a wheelchair)?: None Help needed walking in hospital room?: A Little Help needed climbing 3-5 steps with a railing? : A Little 6 Click Score: 22    End of Session Equipment Utilized During Treatment: Oxygen;Gait belt Activity Tolerance: Patient tolerated treatment well Patient left: in chair;with call bell/phone within reach;with nursing/sitter in room Nurse Communication: Mobility status PT Visit Diagnosis: Muscle weakness (generalized) (M62.81);Unsteadiness on feet (R26.81);Difficulty in walking, not elsewhere classified (R26.2)     Time: 3557-3220 PT Time Calculation (min) (ACUTE ONLY): 33 min  Charges:  $Gait Training: 23-37 mins                    G Codes:       Benjiman Core, [place] Pager [phone] Acute Rehab   Allena Katz 12/13/2017, 3:24 PM

## 2017-12-13 NOTE — Progress Notes (Signed)
PROGRESS NOTE  [name]  OZD:[id number] DOB: 16-Dec-1975 DOA: 11/20/2017 PCP: Sandi Mariscal, MD   Brief Narrative: [name] is a 42 y.o. female with a history of morbid obesity (BMI 31), IDDM, OSA, HTN, Cushing's disease and depression who presented 3/13 after suicide attempt by intentional overdose of baclofen, trazodone, and tramadol. She reportedly was encephalopathic and experienced a seizure, intubated for airway protection and admitted to the ICU. After extubation she developed stridor initially responsive to racemic epinephrine with laryngoscopy revealing fixed vocal cords, though when this recurred 3/21 it was unresponsive necessitating tracheostomy with laryngoscopy revealing circumferential subglottic swelling with necrotic debris and fibrinous exudate. Respiratory status has stabilized, though she continued to have significant secretions with odor. Clindamycin was started 3/27 and ENT reevaluated the patient 4/2 due to CT showing left paralaryngeal abscess. Flexible laryngoscopy was repeated showing pooled secretions in pharynx, and inability to assess vocal cord mobility or subglottic patency due to poor laryngeal airway. Psychiatry has continued to recommend transfer to inpatient psychiatry for suicidal ideation and attempt when medically stable.  Assessment & Plan: Principal Problem:   Severe episode of recurrent major depressive disorder, without psychotic features ([place]) Active Problems:   Intentional overdose of drug in tablet form ([place])   Status epilepticus ([place])   Shortness of breath   Hoarse voice quality   Hypomagnesemia   Acute respiratory failure with hypoxemia ([facility])   SOB (shortness of breath)   Tracheostomy status ([facility])  Acute respiratory failure secondary to glottic obstruction, fixed vocal cords s/p intubation: s/p tracheostomy 11/28/17 with laryngoscopy 3/19 showing posterior supraglottic swelling. CT neck showed left lateral supraglottic laryngeal fluid collection  thought to be abscess with reactive appearing left level II/III and right paratracheal lymph nodes. ENT re-consulted performing laryngoscopy again 4/2 by Dr. Erik Obey showed significant laryngeal edema precluding visualization of vocal cords/subglottic patency but no abscess.  - Continue trach collar, PMV as tolerated. - ENT following patient, planning repeat laryngoscopy evaluation next week.  - WOC and nutrition consulted for wound care instructions and optimizing nutritional status for wound healing.  - Respiratory culture 3/23 consistent with respiratory flora; repeat 3/30 grew moderate group C streptococcus and unspeciated rare GNRs. Changed clindamycin to zosyn 4/4 to additionally cover GNRs. PCN allergy noted, no reaction with zosyn.  Headache: Possibly related to OD, current medications, depression, TMJ. No red flags or indications for imaging.  - Continue percocet, fentanyl prn severe pain, ibuprofen prn headache. Trial triptan prn refractory headache improved symptoms. Still plan to deescalate fentanyl as able over the next couple days.   Intentional overdose with suicidal ideation, depression: To inpatient psychiatry hospital when medically stable.  - Resumed wellbutrin, gabapentin, trazodone.  - Continue sitter, suicide precautions - Appreciate psychiatry consultation. Due to the patient's expected protracted hospitalization here prior to transfer, I have asked for serial consultations.   Acute kidney injury: Resolved. Monitor with reinitiation of NSAID.   Diabetes mellitus, type II with hyperglycemia: Last HbA1c 7.2%, possibly some hyperglycemia from steroids. Near inpatient goal.  - Continue Lantus 38 units at bedtime [home dose 28 70/30 twice daily], 6u mealtime, and resistant SSI - Holding metformin 1000 twice daily, Actos 15 daily   Hyperkalemia: 4/5, 5.8 in the absence of renal dysfunction or telemetry events, noted some hemolysis.  - Will repeat the lab and treat as  indicated, continue telemetry. Reviewed strips today personally. Also getting lasix as below.  Pulmonary edema: Has been intermittent issue throughout hospitalization, suspect beginning to develop based on pt  report, LE edema as well, and crackles at right base. Pt up ~3L for hospitalization.  - Give lasix x1 and monitor I/O  Hypomagnesemia: Resolved with repletion.   Seizure disorder: Stable, no activity.  - Continue gabapentin, and, since no seizures and has been on wellbutrin will continue this. 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   Hypothyroidism: TSH 3.587 - Continue synthroid   Lower extremity swelling:  - No evidence of DVT on U/S 4/5 somewhat limited by habitus, but no definite clinical evidence of DVT either.  Acute toxic encephalopathy: Due to OD. UDS and CT head negative. Resolved.   Shortness of breath and chest pain: Suspect anxiety component. Resolved with trach placement. Workup at time chest x-ray, EKG, troponin, d-dimer unremarkable. Resolved.  Hypotension, sinus tachycardia - Transitioned 3/29 to metoprolol 12.5 twice daily. Stable.  DVT prophylaxis: Lovenox Code Status: Full Family Communication: Husband at bedside Disposition Plan: Inpatient psychiatry when medically stable. Still having tracheostomy issues precluding transfer. Plan to repeat laryngoscopy next week. Uncertain expected date of stability.   Consultants:   CCM  Psychiatry  ENT  Procedures:  Intubation/extubation Renal US Transnasal fiberoptic laryngoscopy x 2 Tracheostomy and direct laryngoscopy   Antimicrobials:  Clindamycin 3/26 - 4/4  Zosyn 4/4 >>   Subjective: Mood improved today per pt and husband. In a more outgoing mood. Headache improved with triptan. Reports leg swelling which is intermittent chronically and slightly worse than yesterday. No redness, pain, but feels tight.   Objective: Vitals:   12/13/17 0400 12/13/17 0728  12/13/17 0849 12/13/17 1115  BP: 96/60 (!) 102/55    Pulse: 74 81 (!) 107 80  Resp: 20 17 (!) 29 17  Temp: 98 F (36.7 C) 97.6 F (36.4 C)    TempSrc: Oral Oral    SpO2: 94% 98% 99% 98%  Weight: (!) 145.6 kg (320 lb 15.8 oz)     Height:        Intake/Output Summary (Last 24 hours) at 12/13/2017 1454 Last data filed at 12/13/2017 0900 Gross per 24 hour  Intake 580 ml  Output 3 ml  Net 577 ml   Filed Weights   12/12/17 0421 12/13/17 0347 12/13/17 0400  Weight: (!) 140.5 kg (309 lb 11.9 oz) (!) 144.2 kg (317 lb 14.5 oz) (!) 145.6 kg (320 lb 15.8 oz)    Gen: Morbidly obese 42 y.o. female in no distress. Cushingoid habitus. Neck: Trach collar with malodorous thick brown secretions running down neck to chest. Difficult to appreciate LNs.  Pulm: Non-labored breathing through trach on significant oxygen, tachypneic, no wheezing or stridor. Distant bilaterally with scant crackle at right base. CV: Regular rate and rhythm. No murmur, rub, or gallop. Unable to appreciate JVD. GI: Abdomen soft, non-tender, non-distended, with normoactive bowel sounds. No organomegaly or masses felt. Ext: Warm, no deformities. 1+ left and trace right LE pedal edema extending to ankles without erythema, palpable cords, varicosites, or focal tenderness. Homan's equivocal bilaterally. Skin: No wounds.  Neuro: Alert and oriented. Ho[place] without ability to phonate, communicates with dry erase board. No focal neurological deficits. Psych: Very depressed with restricted affect.   CBC: Recent Labs  Lab 12/08/17 0307 12/09/17 0329 12/10/17 0330 12/13/17 0605  WBC 11.9* 10.1 9.6 8.3  NEUTROABS 8.6* 6.4 6.7  --   HGB 12.0 11.7* 10.8* 10.8*  HCT 37.0 37.1 34.7* 34.2*  MCV 87.9 90.0 89.7 91.0  PLT 512* 451* 434* 366*   Basic Metabolic Panel: Recent Labs  Lab 12/07/17 0617  12/07/17 0826 12/08/17 0307 12/09/17 0329 12/10/17 0330 12/13/17 0605  NA 130* 126* 130* 133* 136 136  K 5.6* 5.0 4.0 4.2 4.5 5.8*    CL 94* 93* 96* 98* 100* 102  CO2 22 19* 22 25 26 26   GLUCOSE 200* 377* 126* 206* 171* 205*  BUN 36* 36* 33* 26* 18 15  CREATININE 1.55* 1.56* 1.31* 1.05* 0.74 0.85  CALCIUM 9.0 8.6* 8.8* 8.7* 8.9 9.1  MG 1.9  --  1.8 2.0  --   --   PHOS 4.1  --  3.8 3.4 3.3  --    Liver Function Tests: Recent Labs  Lab 12/07/17 0617 12/08/17 0307 12/09/17 0329 12/10/17 0330  ALBUMIN 2.5* 2.4* 2.3* 2.0*   CBG: Recent Labs  Lab 12/12/17 1117 12/12/17 1638 12/12/17 2112 12/13/17 0608 12/13/17 1204  GLUCAP 167* 205* 151* 194* 199*   Urine analysis:    Component Value Date/Time   COLORURINE RED (A) 10/31/2016 2130   APPEARANCEUR CLOUDY (A) 10/31/2016 2130   LABSPEC 1.027 10/31/2016 2130   PHURINE 6.0 10/31/2016 2130   GLUCOSEU >=500 (A) 10/31/2016 2130   HGBUR LARGE (A) 10/31/2016 2130   BILIRUBINUR NEGATIVE 10/31/2016 2130   KETONESUR NEGATIVE 10/31/2016 2130   PROTEINUR 100 (A) 10/31/2016 2130   UROBILINOGEN 0.2 04/09/2014 1157   NITRITE NEGATIVE 10/31/2016 2130   LEUKOCYTESUR NEGATIVE 10/31/2016 2130   Recent Results (from the past 240 hour(s))  Culture, expectorated sputum-assessment     Status: None   Collection Time: 12/07/17  5:18 AM  Result Value Ref Range Status   Specimen Description EXPECTORATED SPUTUM  Final   Special Requests NONE  Final   Sputum evaluation   Final    THIS SPECIMEN IS ACCEPTABLE FOR SPUTUM CULTURE Performed at [facility], [address], [place], [place] [address]    Report Status 12/09/2017 FINAL  Final  Culture, respiratory (NON-Expectorated)     Status: None   Collection Time: 12/07/17  5:18 AM  Result Value Ref Range Status   Specimen Description EXPECTORATED SPUTUM  Final   Special Requests NONE Reflexed from [id number]  Final   Gram Stain   Final    FEW WBC PRESENT, PREDOMINANTLY PMN FEW GRAM POSITIVE COCCI RARE GRAM NEGATIVE RODS Performed at [facility], [place] [address] [place], [place] [address]    Culture MODERATE  STREPTOCOCCUS GROUP C  Final   Report Status 12/09/2017 FINAL  Final      Radiology Studies: No results found.  Scheduled Meds: . buPROPion  100 mg Oral BID  . chlorhexidine  15 mL Mouth Rinse BID  . cholestyramine  4 g Oral QODAY  . enoxaparin (LOVENOX) injection  70 mg Subcutaneous Q24H  . feeding supplement (PRO-STAT SUGAR FREE 64)  30 mL Oral BID  . gabapentin  300 mg Oral BID  . insulin aspart  0-20 Units Subcutaneous TID WC  . insulin aspart  6 Units Subcutaneous TID WC  . insulin glargine  40 Units Subcutaneous QHS  . levothyroxine  75 mcg Oral QAC breakfast  . mouth rinse  15 mL Mouth Rinse q12n4p  . metoprolol tartrate  12.5 mg Oral BID  . pantoprazole  40 mg Oral BID AC  . Racepinephrine HCl  0.5 mL Nebulization Once   Continuous Infusions: . piperacillin-tazobactam (ZOSYN)  IV Stopped (12/13/17 1230)     LOS: 23 days   Time spent: 25 minutes.  Vance Gather, MD [facility] Pager [phone]  If 7PM-7AM, please  contact night-coverage [email or web address] Password [email or web address] 12/13/2017, 2:54 PM

## 2017-12-13 NOTE — Care Management Note (Signed)
Case Management Note  Patient Details  Name: [name] MRN: [id number] Date of Birth: 03/19/1976  Subjective/Objective:                    Action/Plan: Awaiting psychiatry to resee. Plan is for [facility] when medically stable. CM following.   Expected Discharge Date:                  Expected Discharge Plan:  [facility]  In-House Referral:     Discharge planning Services  CM Consult  Post Acute Care Choice:    Choice offered to:     DME Arranged:    DME Agency:     HH Arranged:    [place] Agency:     Status of Service:  In process, will continue to follow  If discussed at Long Length of Stay Meetings, dates discussed:    Additional Comments:  Pollie Friar, RN 12/13/2017, 1:11 PM

## 2017-12-13 NOTE — Progress Notes (Signed)
Preliminary notes by tech--Bilateral lower extremities venous study completed. Limited and difficult study due to patient condition. Veins where visualized compressible with poor flow. Therefore, this is an  Inconclusive study.   Hongying Alden Feagan(RDMS RVT) 12/13/17 10:11 AM

## 2017-12-13 NOTE — Consult Note (Signed)
[facility] Psych Consult Progress Note  12/13/2017 8:13 AM [name]  MRN:  [id number] Subjective:   [name] reports was last seen on 3/22. She was admitted following a suicide attempt with Baclofen, Trazodone and Tramadol. She was recommended for inpatient psychiatric hospitalization. Trazodone 50 mg qhs PRN, Wellbutrin 100 mg BID and Gabapentin 300 mg BID were restated on 3/18.   On interview, [name] is unable to communicate by speaking due to tracheostomy but she answers questions by writing her answers on a dry erase board.  She reports that she is doing good but she wants to go home.  She reports, "hell no" to having SI or HI.  She denies AVH.  She reports poor appetite but is happy that she has lost 40 pounds.  She denies problems with sleep.  She reports wanting to get back to her "normal things."   Principal Problem: Severe episode of recurrent major depressive disorder, without psychotic features ([place]) Diagnosis:   Patient Active Problem List   Diagnosis Date Noted  . Acute respiratory failure with hypoxemia ([place]) [J96.01]   . SOB (shortness of breath) [R06.02]   . 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 Hoarse voice quality [R49.0]   . Hypomagnesemia [E83.42]   . Shortness of breath [R06.02]   . Severe episode of recurrent major depressive disorder, without psychotic features ([place]) [F33.2]   . Intentional overdose of drug in tablet form ([place]) [T50.902A] 11/20/2017  . Intentional drug overdose ([place]) [T50.902A]   . Seizure ([place]) [R56.9]   . Acute respiratory failure with hypercapnia ([place]) [J96.02]   . Status epilepticus ([place]) [G40.901]   . 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 Abdominal pain, epigastric [R10.13]   . LUQ abdominal pain [R10.12]   . Nausea and vomiting [R11.2]   . Gastroparesis [K31.84]   . Hypertriglyceridemia [E78.1] 07/22/2015  . Diabetes mellitus due to underlying condition, uncontrolled, with diabetic  neuropathy, with long-term current use of insulin (HCC) [E08.40, E08.65, Z79.4]   . Vitamin D deficiency [E55.9] 07/19/2015  . 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 Osteoporosis [M81.0] 07/19/2015  . 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 Fracture, tibial plateau [S82.143A] 07/16/2015  . Uncontrolled diabetes mellitus with diabetic neuropathy, with long-term current use of insulin ([place]) [E11.40, Z79.4, E11.65] 07/16/2015  . Leukocytosis [D72.829] 07/16/2015  . Essential hypertension [I10] 07/16/2015  . Morbid obesity ([place]) [E66.01] 04/20/2014  . Hyperlipidemia [E78.5] 07/31/2012  . Cushing disease ([place]) [E24.0] 07/31/2012  . Hypothyroid [E03.9] 07/31/2012  . Major depressive disorder, recurrent episode, mild ([place]) [F33.0] 04/08/2012   Total Time spent with patient: 15 minutes  Past Psychiatric History: MDD  Past Medical History:  Past Medical History:  Diagnosis Date  . Anxiety   . Complication of anesthesia    Pt. states takes long time to wake up from it.   . Cushing's disease ([place])   . Depression   . Diabetes ([place])   . Hyperlipidemia   . Hyperlipidemia   . Hypertension   . Morbid obesity ([place])   . Osteoporosis 07/19/2015  . 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 Sleep apnea   . Uncontrolled diabetes mellitus with diabetic neuropathy, with long-term current use of insulin ([place]) 07/16/2015  . Vitamin D deficiency 07/19/2015    Past Surgical History:  Procedure Laterality Date  . ANTERIOR TALOFIBULAR LIGAMENT  REPAIR Left 11/15/2014   Procedure: ANTERIOR TALOFIBULAR LIGAMENT REPAIR;  Surgeon: Jana Half, DPM;  Location: [facility];  Service: Podiatry;  Laterality: Left;  . CESAREAN SECTION  dec 1997/  06-03-2001/   01-01-2005   BILATERAL TUBAL LIGATION WITH LAST ONE  . DILATION  AND CURETTAGE OF UTERUS  1995   WITH SUCTION  . ESOPHAGOGASTRODUODENOSCOPY (EGD) WITH PROPOFOL N/A 10/04/2016   Procedure: ESOPHAGOGASTRODUODENOSCOPY (EGD) WITH PROPOFOL;  Surgeon: [name], MD;  Location: [facility];  Service: Endoscopy;  Laterality: N/A;  . LAPAROSCOPIC CHOLECYSTECTOMY  09-25-2005  . ORIF TIBIA PLATEAU Right 07/19/2015   Procedure: OPEN REDUCTION INTERNAL FIXATION (ORIF) RIGHT TIBIAL PLATEAU;  Surgeon: Altamese Emory, MD;  Location: [facility];  Service: Orthopedics;  Laterality: Right;  . PARTIAL KNEE ARTHROPLASTY Right 04/19/2014   Procedure: RIGHT UNI KNEE ARTHROPLASTY MEDIALLY ;  Surgeon: Mauri Pole, MD;  Location: [facility];  Service: Orthopedics;  Laterality: Right;  . TRACHEOSTOMY TUBE PLACEMENT N/A 11/28/2017   Procedure: TRACHEOSTOMY;  Surgeon: Izora Gala, MD;  Location: [facility];  Service: ENT;  Laterality: N/A;  . TUBAL LIGATION     Family History:  Family History  Adopted: Yes  Problem Relation Age of Onset  . Autism Son   . ADD / ADHD Son   . Apraxia Son    Family Psychiatric  History: Son-autism and ADHD.  Social History:  Social History   Substance and Sexual Activity  Alcohol Use Yes   Comment: RARE     Social History   Substance and Sexual Activity  Drug Use No  . Types: Marijuana    Social History   Socioeconomic History  . Marital status: Married    Spouse name: [name]  . Number of children: 2  . Years of education: Not on file  . Highest education level: Not on file  Occupational History  . Not on file  Social Needs  . Financial resource strain: Not on file  . Food insecurity:    Worry: Not on file    Inability: Not on file  . Transportation needs:    Medical: Not on file    Non-medical: Not on file  Tobacco Use  . Smoking status: Current Every Day Smoker    Years: 4.00    Types: Cigarettes  . Smokeless tobacco: Never Used  Substance and Sexual Activity  . Alcohol use: Yes    Comment: RARE  . Drug use: No    Types:  Marijuana  . Sexual activity: Yes    Partners: Male    Birth control/protection: Pill  Lifestyle  . Physical activity:    Days per week: Patient refused    Minutes per session: Patient refused  . Stress: Not on file  Relationships  . Social connections:    Talks on phone: Not on file    Gets together: Not on file    Attends religious service: Not on file    Active member of club or organization: Not on file    Attends meetings of clubs or organizations: Not on file    Relationship status: Not on file  Other Topics Concern  . Not on file  Social History Narrative  . Not on file    Sleep: Good  Appetite:  Poor  Current Medications: Current Facility-Administered Medications  Medication Dose Route Frequency Provider Last Rate Last Dose  . acetaminophen (TYLENOL) tablet 650 mg  650 mg Oral Q6H PRN Bodenheimer, Charles A, NP   650 mg  at 12/07/17 0817  . buPROPion [facility]) tablet 100 mg  100 mg Oral BID Rosita Fire, MD   100 mg at 12/13/17 0813  . chlorhexidine (PERIDEX) 0.12 % solution 15 mL  15 mL Mouth Rinse BID Cristal Ford, DO   15 mL at 12/12/17 2310  . cholestyramine (QUESTRAN) packet 4 g  4 g Oral Georgeanna Lea, MD   4 g at 12/11/17 0906  . enoxaparin (LOVENOX) injection 70 mg  70 mg Subcutaneous Q24H Dang, Thuy D, RPH   70 mg at 12/12/17 1537  . feeding supplement (PRO-STAT SUGAR FREE 64) liquid 30 mL  30 mL Oral BID Patrecia Pour, MD   30 mL at 12/12/17 2310  . fentaNYL (SUBLIMAZE) injection 25 mcg  25 mcg Intravenous Q4H PRN Nita Sells, MD   25 mcg at 12/12/17 0221  . gabapentin (NEURONTIN) capsule 300 mg  300 mg Oral BID Rosita Fire, MD   300 mg at 12/12/17 2310  . hydrALAZINE (APRESOLINE) injection 10 mg  10 mg Intravenous Q4H PRN Marijean Heath, NP   10 mg at 11/22/17 1358  . ibuprofen (ADVIL,MOTRIN) tablet 600 mg  600 mg Oral Q6H PRN Patrecia Pour, MD   600 mg at 12/12/17 0643  . insulin aspart (novoLOG) injection  0-20 Units  0-20 Units Subcutaneous TID WC Nita Sells, MD   4 Units at 12/13/17 [phone]  . insulin aspart (novoLOG) injection 6 Units  6 Units Subcutaneous TID WC Nita Sells, MD   6 Units at 12/13/17 0813  . insulin glargine (LANTUS) injection 40 Units  40 Units Subcutaneous QHS Patrecia Pour, MD   40 Units at 12/12/17 2311  . ipratropium-albuterol (DUONEB) 0.5-2.5 (3) MG/3ML nebulizer solution 3 mL  3 mL Nebulization Q4H PRN Nita Sells, MD   3 mL at 12/12/17 2046  . levothyroxine (SYNTHROID, LEVOTHROID) tablet 75 mcg  75 mcg Oral QAC breakfast Nita Sells, MD   75 mcg at 12/13/17 0813  . lidocaine (XYLOCAINE) 2 % jelly 1 application  1 application Topical Once PRN Nita Sells, MD      . lidocaine (XYLOCAINE) 4 % external solution 0-50 mL  0-50 mL Topical Once PRN [name], MD      . lidocaine-EPINEPHrine (XYLOCAINE-EPINEPHrine) 1 %-1:200000 (PF) injection 0-30 mL  0-30 mL Intradermal Once PRN [name], MD      . MEDLINE mouth rinse  15 mL Mouth Rinse q12n4p Mikhail, Virgil, DO   15 mL at 12/11/17 1701  . menthol-cetylpyridinium (CEPACOL) lozenge 3 mg  1 lozenge Oral PRN Deterding, Gu[place]upe Sabin, MD   3 mg at 11/27/17 2336  . metoprolol tartrate (LOPRESSOR) tablet 12.5 mg  12.5 mg Oral BID Nita Sells, MD   12.5 mg at 12/12/17 2310  . oxyCODONE-acetaminophen (PERCOCET/ROXICET) 5-325 MG per tablet 1-2 tablet  1-2 tablet Oral Q4H PRN Nita Sells, MD   2 tablet at 12/13/17 1308  . oxymetazoline (AFRIN) 0.05 % nasal spray 1 spray  1 spray Each Nare Once PRN [name], MD      . oxymetazoline (AFRIN) 0.05 % nasal spray 1 spray  1 spray Each Nare Once PRN [name], MD      . pantoprazole (PROTONIX) EC tablet 40 mg  40 mg Oral BID AC Nita Sells, MD   40 mg at 12/12/17 1538  . phenol (CHLORASEPTIC) mouth spray 1 spray  1 spray Mouth/Throat PRN Cristal Ford, DO   1 spray at 11/27/17 2145  .  piperacillin-tazobactam (ZOSYN) IVPB 3.375 g  3.375 g Intravenous Q8H Bajbus, Lauren D, RPH 12.5 mL/hr at 12/13/17 0430 3.375 g at 12/13/17 0430  . Racepinephrine HCl 2.25 % nebulizer solution 0.5 mL  0.5 mL Nebulization Once Cristal Ford, DO      . silver nitrate applicators applicator 1 Stick  1 Stick Topical Once PRN [name], MD      . sodium chloride flush (NS) 0.9 % injection 10-40 mL  10-40 mL Intracatheter PRN Patrecia Pour, MD   10 mL at 12/13/17 0005  . traZODone (DESYREL) tablet 50 mg  50 mg Oral QHS PRN Faythe Dingwall, DO   50 mg at 11/26/17 2226  . TRIPLE ANTIBIOTIC [phone] OINT 1 application  1 application Apply externally Once PRN [name], MD        Lab Results:  Results for orders placed or performed during the hospital encounter of 11/20/17 (from the past 48 hour(s))  Glucose, capillary     Status: Abnormal   Collection Time: 12/11/17 11:36 AM  Result Value Ref Range   Glucose-Capillary 174 (H) 65 - 99 mg/dL  Glucose, capillary     Status: Abnormal   Collection Time: 12/11/17  5:03 PM  Result Value Ref Range   Glucose-Capillary 167 (H) 65 - 99 mg/dL  Glucose, capillary     Status: Abnormal   Collection Time: 12/11/17  9:38 PM  Result Value Ref Range   Glucose-Capillary 184 (H) 65 - 99 mg/dL   Comment 1 Notify RN    Comment 2 Document in Chart   Glucose, capillary     Status: Abnormal   Collection Time: 12/12/17  6:08 AM  Result Value Ref Range   Glucose-Capillary 199 (H) 65 - 99 mg/dL  Glucose, capillary     Status: Abnormal   Collection Time: 12/12/17 11:17 AM  Result Value Ref Range   Glucose-Capillary 167 (H) 65 - 99 mg/dL  Glucose, capillary     Status: Abnormal   Collection Time: 12/12/17  4:38 PM  Result Value Ref Range   Glucose-Capillary 205 (H) 65 - 99 mg/dL  Glucose, capillary     Status: Abnormal   Collection Time: 12/12/17  9:12 PM  Result Value Ref Range   Glucose-Capillary 151 (H) 65 - 99 mg/dL   Comment 1 Notify RN     Comment 2 Document in Chart   CBC     Status: Abnormal   Collection Time: 12/13/17  6:05 AM  Result Value Ref Range   WBC 8.3 4.0 - 10.5 K/uL   RBC 3.76 (L) 3.87 - 5.11 MIL/uL   Hemoglobin 10.8 (L) 12.0 - 15.0 g/dL   HCT 34.2 (L) 36.0 - 46.0 %   MCV 91.0 78.0 - 100.0 fL   MCH 28.7 26.0 - 34.0 pg   MCHC 31.6 30.0 - 36.0 g/dL   RDW 13.4 11.5 - 15.5 %   Platelets 413 (H) 150 - 400 K/uL    Comment: Performed at [facility], [address] [place], [place] [address]  Glucose, capillary     Status: Abnormal   Collection Time: 12/13/17  6:08 AM  Result Value Ref Range   Glucose-Capillary 194 (H) 65 - 99 mg/dL   Comment 1 Notify RN    Comment 2 Document in Chart     Blood Alcohol level:  Lab Results  Component Value Date   ETH <10 11/20/2017    Musculoskeletal: Strength & Muscle Tone: decreased due to physical deconditioning.  Gait & Station: UTA since patient was sitting in a chair. Patient leans: N/A  Psychiatric Specialty Exam: Physical Exam  Nursing note and vitals reviewed. Constitutional: She is oriented to person, place, and time. She appears well-developed and well-nourished.  HENT:  Head: Normocephalic and atraumatic.  Neck: Normal range of motion.  Respiratory: Effort normal.  Musculoskeletal: Normal range of motion.  Neurological: She is alert and oriented to person, place, and time.  Skin: No rash noted.  Psychiatric: She has a normal mood and affect. Her speech is normal and behavior is normal. Judgment and thought content normal. Cognition and memory are normal.    Review of Systems  Constitutional: Negative for chills and fever.  Cardiovascular: Negative for chest pain.  Gastrointestinal: Negative for abdominal pain, constipation, diarrhea, nausea and vomiting.  Musculoskeletal: Positive for back pain.  Neurological: Positive for headaches.  Psychiatric/Behavioral: Negative for depression, hallucinations and suicidal ideas. The patient does not  have insomnia.   All other systems reviewed and are negative.   Blood pressure (!) 102/55, pulse 81, temperature 97.6 F (36.4 C), temperature source Oral, resp. rate 17, height 5\' 2"  (1.575 m), weight (!) 145.6 kg (320 lb 15.8 oz), SpO2 98 %.Body mass index is 58.71 kg/m.  General Appearance: Fairly Groomed, overweight, middle aged, Caucasian female, wearing a hospital gown with a tracheostomy and sitting in a chair. NAD.  Eye Contact:  Good  Speech:  Hoarse voice due to tracheostomy and writes on her dry erase board.  Volume:  Decreased  Mood:  "Good"  Affect:  Appropriate and Congruent  Thought Process:  Goal Directed, Linear and Descriptions of Associations: Intact  Orientation:  Full (Time, Place, and Person)  Thought Content:  Logical  Suicidal Thoughts:  No  Homicidal Thoughts:  No  Memory:  Immediate;   Good Recent;   Good Remote;   Good  Judgement:  Fair  Insight:  Fair  Psychomotor Activity:  Normal  Concentration:  Concentration: Good and Attention Span: Good  Recall:  Good  Fund of Knowledge:  Good  Language:  Good  Akathisia:  No  Handed:  Right  AIMS (if indicated):   N/A  Assets:  Communication Skills Desire for Improvement Housing Intimacy Social Support  ADL's:  Impaired  Cognition:  WNL  Sleep:   Okay   Assessment:  [name] is a 42 y.o. female who was admitted for suicide attempt by drug overdose in the setting of depression and family stressors. She was last seen on 3/22. 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 She reports an improvement in her mood on interview. She denies SI, HI or AVH. She continues to warrant inpatient psychiatric hospitalization due to severe suicide attempt and still  endorsing SI since hospitalization (asked her mother to kill her on 3/22).   Treatment Plan Summary: -Patient warrants inpatient psychiatric hospitalization given high risk of harm to self. -Continue Engineer, materials.  -Continue home medications: Continue Wellbutrin 100 mg BID for depression, Trazodone 50 mg qhs PRN for insomnia and Gabapentin 300 mg BID for anxiety.  -Please pursue involuntary commitment if patient refuses voluntary psychiatric hospitalization or attempts to leave the hospital.  -Psychiatry will follow patient as needed.     Faythe Dingwall, DO 12/13/2017, 8:13 AM

## 2017-12-14 LAB — BASIC METABOLIC PANEL
Anion gap: 11 (ref 5–15)
BUN: 15 mg/dL (ref 6–20)
CHLORIDE: 101 mmol/L (ref 101–111)
CO2: 24 mmol/L (ref 22–32)
Calcium: 8.9 mg/dL (ref 8.9–10.3)
Creatinine, Ser: 0.97 mg/dL (ref 0.44–1.00)
GFR calc Af Amer: >60 mL/min (ref >60)
GFR calc non Af Amer: >60 mL/min (ref >60)
GLUCOSE: 178 mg/dL — AB (ref 65–99)
POTASSIUM: 5.2 mmol/L — AB (ref 3.5–5.1)
Sodium: 136 mmol/L (ref 135–145)

## 2017-12-14 LAB — GLUCOSE, CAPILLARY
Glucose-Capillary: 163 mg/dL — ABNORMAL HIGH (ref 65–99)
Glucose-Capillary: 219 mg/dL — ABNORMAL HIGH (ref 65–99)
Glucose-Capillary: 149 mg/dL — ABNORMAL HIGH (ref 65–99)
Glucose-Capillary: 148 mg/dL — ABNORMAL HIGH (ref 65–99)

## 2017-12-14 MED ORDER — FUROSEMIDE 10 MG/ML IJ SOLN
60.0000 mg | Freq: Once | INTRAMUSCULAR | Status: AC
  Administered 2017-12-14: 60 mg via INTRAVENOUS
  Filled 2017-12-14: qty 8

## 2017-12-14 MED ORDER — SODIUM POLYSTYRENE SULFONATE 15 GM/60ML PO SUSP
30.0000 g | Freq: Once | ORAL | Status: AC
  Administered 2017-12-14: 30 g via ORAL
  Filled 2017-12-14: qty 120

## 2017-12-14 NOTE — Progress Notes (Signed)
PROGRESS NOTE  [name]  TIR:[id number] DOB: 1975/12/10 DOA: 11/20/2017 PCP: Sandi Mariscal, MD   Brief Narrative: [name] is a 42 y.o. female with a history of morbid obesity (BMI 40), IDDM, OSA, HTN, Cushing's disease and depression who presented 3/13 after suicide attempt by intentional overdose of baclofen, trazodone, and tramadol. She reportedly was encephalopathic and experienced a seizure, intubated for airway protection and admitted to the ICU. After extubation she developed stridor initially responsive to racemic epinephrine with laryngoscopy revealing fixed vocal cords, though when this recurred 3/21 it was unresponsive necessitating tracheostomy with laryngoscopy revealing circumferential subglottic swelling with necrotic debris and fibrinous exudate. Respiratory status has stabilized, though she continued to have significant secretions with odor. Clindamycin was started 3/27 and ENT reevaluated the patient 4/2 due to CT showing left paralaryngeal abscess. Flexible laryngoscopy was repeated showing pooled secretions in pharynx, and inability to assess vocal cord mobility or subglottic patency due to poor laryngeal airway. Psychiatry has continued to recommend transfer to inpatient psychiatry for suicidal ideation and attempt when medically stable.  Assessment & Plan: Principal Problem:   Severe episode of recurrent major depressive disorder, without psychotic features ([place]) Active Problems:   Intentional overdose of drug in tablet form ([place])   Status epilepticus ([place])   Shortness of breath   Hoarse voice quality   Hypomagnesemia   Acute respiratory failure with hypoxemia (HCC)   SOB (shortness of breath)   Tracheostomy status (HCC)  Acute respiratory failure due to overdose now s/p tracheostomy secondary to glottic obstruction, fixed vocal cords s/p intubation and wound infection: s/p tracheostomy 11/28/17 with laryngoscopy 3/19 showing posterior supraglottic swelling. CT neck  showed left lateral supraglottic laryngeal fluid collection thought to be abscess with reactive appearing left level II/III and right paratracheal lymph nodes. ENT re-consulted performing laryngoscopy again 4/2 by Dr. Erik Obey showed significant laryngeal edema precluding visualization of vocal cords/subglottic patency but no abscess.  - Continue trach collar, PMV as tolerated. - ENT following patient, planning repeat laryngoscopy evaluation next week.  - WOC and nutrition consulted for wound care instructions and optimizing nutritional status for wound healing. Appreciate their recommendations. - Respiratory culture 3/23 consistent with respiratory flora; repeat 3/30 grew moderate group C streptococcus and unspeciated rare GNRs. Changed clindamycin to zosyn 4/4 to additionally cover GNRs. PCN allergy noted, no reaction with zosyn.  Headache: Possibly related to OD, current medications, depression, TMJ. No red flags or indications for imaging.  - Continue percocet, fentanyl prn severe pain, ibuprofen prn headache. Trial triptan prn refractory headache improved symptoms. Taking much less fentanyl.  Intentional overdose with suicidal ideation, depression: To inpatient psychiatry hospital when medically stable.  - Resumed wellbutrin, gabapentin, trazodone.  - Continue sitter, suicide precautions - Appreciate psychiatry consultation, last seen 4/5, unchanged recommendations.  Acute kidney injury: Resolved. Monitor with reinitiation of NSAID, diuresis.   Diabetes mellitus, type II with hyperglycemia: Last HbA1c 7.2%, possibly some hyperglycemia from steroids. Near inpatient goal.  - Continue Lantus at bedtime > increase to 45u nightly [home dose 28 70/30 twice daily], continue 6u mealtime, and resistant SSI - Holding metformin 1000 twice daily, Actos 15 daily   Hyperkalemia: 4/5, 5.8 in the absence of renal dysfunction. Improved with lasix to 5.2.  - Lasix as below - Continue telemetry - Give  kayexalate x1  Pulmonary edema: Has been intermittent issue throughout hospitalization, suspect beginning to develop based on pt report, LE edema as well, and crackles at right base. Pt up ~3L for hospitalization.  -  Repeat lasix today for crackles in lungs and edema. If this continues tomorrow, will repeat CXR - Monitor I/O  Hypomagnesemia: Resolved with repletion.   Seizure disorder: Stable, no activity.  - Continue gabapentin, and, since no seizures and has been on wellbutrin will continue this. 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   Hypothyroidism: TSH 3.587 - Continue synthroid   Lower extremity swelling:  - No evidence of DVT on U/S 4/5 somewhat limited by habitus, but no definite clinical evidence of DVT either.  Acute toxic encephalopathy: Due to OD. UDS and CT head negative. Resolved.   Shortness of breath and chest pain: Suspect anxiety component. Resolved with trach placement. Workup at time chest x-ray, EKG, troponin, d-dimer unremarkable. Resolved.  Hypotension, sinus tachycardia - Transitioned 3/29 to metoprolol 12.5 twice daily. Stable.  DVT prophylaxis: Lovenox Code Status: Full Family Communication: Husband at bedside Disposition Plan: Inpatient psychiatry when medically stable. Still having tracheostomy issues precluding transfer. Plan to repeat laryngoscopy next week. Uncertain expected date of stability.   Consultants:   CCM  Psychiatry  ENT  Procedures:  Intubation/extubation Renal US Transnasal fiberoptic laryngoscopy x 2 Tracheostomy and direct laryngoscopy   Antimicrobials:  Clindamycin 3/26 - 4/4  Zosyn 4/4 >>   Subjective: Headache returned today but has not gotten any prns yet. Continues to be worried about bilateral leg swelling which is stable L > R, moderate. Only mild diuresis noted with lasix 40mg  IV x1. No chest pain, dyspnea, palpitations or other complaints.  Objective: Vitals:    12/14/17 0357 12/14/17 0737 12/14/17 0909 12/14/17 1205  BP: (!) 86/67 (!) 99/57    Pulse: 74 87 88 93  Resp: 18 14 16 16   Temp: 98.5 F (36.9 C) 98.1 F (36.7 C)    TempSrc: Oral Oral    SpO2: 100% 97% 98% 98%  Weight:      Height:        Intake/Output Summary (Last 24 hours) at 12/14/2017 1248 Last data filed at 12/14/2017 0815 Gross per 24 hour  Intake 1010 ml  Output 750 ml  Net 260 ml   Filed Weights   12/13/17 0347 12/13/17 0400 12/14/17 0300  Weight: (!) 144.2 kg (317 lb 14.5 oz) (!) 145.6 kg (320 lb 15.8 oz) (!) 141.2 kg (311 lb 4.6 oz)    Gen: Morbidly obese 42 y.o. female in no distress. Cushingoid habitus. Neck: Trach collar with tracheostomy site discharging malodorous thick brown secretions onto dressing. Tender to light palpation without periwound fluctuance. Difficult to appreciate LNs.  Pulm: Non-labored, normal rate, through trach on humidified O2. Still no wheezing or stridor. Distant bilaterally with scant crackles at right base remaining. CV: Regular rate and rhythm. No murmur, rub, or gallop. Unable to appreciate JVD. GI: Abdomen soft, non-tender, non-distended, with normoactive bowel sounds. No organomegaly or masses felt. Ext: Warm, no deformities. Unchanged 1+ left and trace right LE pedal edema extending to ankles without erythema, palpable cords, varicosites, or focal tenderness. Homan's equivocal bilaterally. Skin: No wounds.  Neuro: Alert and oriented. No aphasia, speaks with PMV and writing. No focal CN or peripheral nerve deficits. Psych: Very depressed with congruent, broadened affect.   CBC: Recent Labs  Lab 12/08/17 0307 12/09/17 0329 12/10/17 0330 12/13/17 0605  WBC 11.9* 10.1 9.6 8.3  NEUTROABS 8.6* 6.4 6.7  --   HGB 12.0 11.7* 10.8* 10.8*  HCT 37.0 37.1 34.7* 34.2*  MCV 87.9 90.0 89.7 91.0  PLT 512* 451* 434* 614*   Basic Metabolic  Panel: Recent Labs  Lab 12/08/17 0307 12/09/17 0329 12/10/17 0330 12/13/17 0605 12/13/17 1517  12/14/17 0552  NA 130* 133* 136 136  --  136  K 4.0 4.2 4.5 5.8* 5.8* 5.2*  CL 96* 98* 100* 102  --  101  CO2 22 25 26 26   --  24  GLUCOSE 126* 206* 171* 205*  --  178*  BUN 33* 26* 18 15  --  15  CREATININE 1.31* 1.05* 0.74 0.85  --  0.97  CALCIUM 8.8* 8.7* 8.9 9.1  --  8.9  MG 1.8 2.0  --   --   --   --   PHOS 3.8 3.4 3.3  --   --   --    Liver Function Tests: Recent Labs  Lab 12/08/17 0307 12/09/17 0329 12/10/17 0330  ALBUMIN 2.4* 2.3* 2.0*   CBG: Recent Labs  Lab 12/13/17 1204 12/13/17 1728 12/13/17 2008 12/14/17 0602 12/14/17 1113  GLUCAP 199* 204* 173* 163* 219*   Urine analysis:    Component Value Date/Time   COLORURINE RED (A) 10/31/2016 2130   APPEARANCEUR CLOUDY (A) 10/31/2016 2130   LABSPEC 1.027 10/31/2016 2130   PHURINE 6.0 10/31/2016 2130   GLUCOSEU >=500 (A) 10/31/2016 2130   HGBUR LARGE (A) 10/31/2016 2130   BILIRUBINUR NEGATIVE 10/31/2016 2130   [place] NEGATIVE 10/31/2016 2130   PROTEINUR 100 (A) 10/31/2016 2130   UROBILINOGEN 0.2 04/09/2014 1157   NITRITE NEGATIVE 10/31/2016 2130   LEUKOCYTESUR NEGATIVE 10/31/2016 2130   Recent Results (from the past 240 hour(s))  Culture, expectorated sputum-assessment     Status: None   Collection Time: 12/07/17  5:18 AM  Result Value Ref Range Status   Specimen Description EXPECTORATED SPUTUM  Final   Special Requests NONE  Final   Sputum evaluation   Final    THIS SPECIMEN IS ACCEPTABLE FOR SPUTUM CULTURE Performed at [facility], [address], [place], [place] [address]    Report Status 12/09/2017 FINAL  Final  Culture, respiratory (NON-Expectorated)     Status: None   Collection Time: 12/07/17  5:18 AM  Result Value Ref Range Status   Specimen Description EXPECTORATED SPUTUM  Final   Special Requests NONE Reflexed from [id number]  Final   Gram Stain   Final    FEW WBC PRESENT, PREDOMINANTLY PMN FEW GRAM POSITIVE COCCI RARE GRAM NEGATIVE RODS Performed at [facility], [place] [address] [place], [place] [address]    Culture MODERATE STREPTOCOCCUS GROUP C  Final   Report Status 12/09/2017 FINAL  Final      Radiology Studies: No results found.  Scheduled Meds: . buPROPion  100 mg Oral BID  . chlorhexidine  15 mL Mouth Rinse BID  . cholestyramine  4 g Oral QODAY  . enoxaparin (LOVENOX) injection  70 mg Subcutaneous Q24H  . feeding supplement (PRO-STAT SUGAR FREE 64)  30 mL Oral BID  . gabapentin  300 mg Oral BID  . insulin aspart  0-20 Units Subcutaneous TID WC  . insulin aspart  6 Units Subcutaneous TID WC  . insulin glargine  40 Units Subcutaneous QHS  . levothyroxine  75 mcg Oral QAC breakfast  . mouth rinse  15 mL Mouth Rinse q12n4p  . metoprolol tartrate  12.5 mg Oral BID  . pantoprazole  40 mg Oral BID AC  . Racepinephrine HCl  0.5 mL Nebulization Once   Continuous Infusions: . piperacillin-tazobactam (ZOSYN)  IV Stopped (12/14/17 1020)  LOS: 24 days   Time spent: 25 minutes.  Vance Gather, MD [facility] Pager [phone]  If 7PM-7AM, please contact night-coverage [email or web address] Password [facility] 12/14/2017, 12:48 PM

## 2017-12-15 LAB — GLUCOSE, CAPILLARY
Glucose-Capillary: 158 mg/dL — ABNORMAL HIGH (ref 65–99)
Glucose-Capillary: 272 mg/dL — ABNORMAL HIGH (ref 65–99)
Glucose-Capillary: 225 mg/dL — ABNORMAL HIGH (ref 65–99)
Glucose-Capillary: 207 mg/dL — ABNORMAL HIGH (ref 65–99)

## 2017-12-15 LAB — BASIC METABOLIC PANEL
Anion gap: 10 (ref 5–15)
BUN: 16 mg/dL (ref 6–20)
CALCIUM: 8.8 mg/dL — AB (ref 8.9–10.3)
CO2: 28 mmol/L (ref 22–32)
CREATININE: 1.01 mg/dL — AB (ref 0.44–1.00)
Chloride: 99 mmol/L — ABNORMAL LOW (ref 101–111)
GFR calc non Af Amer: >60 mL/min (ref >60)
GLUCOSE: 174 mg/dL — AB (ref 65–99)
Potassium: 4.7 mmol/L (ref 3.5–5.1)
Sodium: 137 mmol/L (ref 135–145)

## 2017-12-15 MED ORDER — BUPROPION HCL 100 MG PO TABS
100.0000 mg | ORAL_TABLET | Freq: Two times a day (BID) | ORAL | Status: DC
  Administered 2017-12-15 – 2018-01-07 (×44): 100 mg via ORAL
  Filled 2017-12-15 (×48): qty 1

## 2017-12-15 NOTE — Progress Notes (Signed)
PROGRESS NOTE  [name]  PZW:[id number] DOB: 1976/04/12 DOA: 11/20/2017 PCP: Sandi Mariscal, MD   Brief Narrative: [name] is a 42 y.o. female with a history of morbid obesity (BMI 11), IDDM, OSA, HTN, Cushing's disease and depression who presented 3/13 after suicide attempt by intentional overdose of baclofen, trazodone, and tramadol. She reportedly was encephalopathic and experienced a seizure, intubated for airway protection and admitted to the ICU. After extubation she developed stridor initially responsive to racemic epinephrine with laryngoscopy revealing fixed vocal cords, though when this recurred 3/21 it was unresponsive necessitating tracheostomy with laryngoscopy revealing circumferential subglottic swelling with necrotic debris and fibrinous exudate. Respiratory status has stabilized, though she continued to have significant secretions with odor. Clindamycin was started 3/27 and ENT reevaluated the patient 4/2 due to CT showing left paralaryngeal abscess. Flexible laryngoscopy was repeated showing pooled secretions in pharynx, and inability to assess vocal cord mobility or subglottic patency due to poor laryngeal airway. Psychiatry has continued to recommend transfer to inpatient psychiatry for suicidal ideation and attempt when medically stable.  Assessment & Plan: Principal Problem:   Severe episode of recurrent major depressive disorder, without psychotic features ([place]) Active Problems:   Intentional overdose of drug in tablet form ([place])   Status epilepticus ([place])   Shortness of breath   Hoarse voice quality   Hypomagnesemia   Acute respiratory failure with hypoxemia (HCC)   SOB (shortness of breath)   Tracheostomy status (HCC)  Acute respiratory failure due to overdose now s/p tracheostomy secondary to glottic obstruction, fixed vocal cords s/p intubation and wound infection: s/p tracheostomy 11/28/17 with laryngoscopy 3/19 showing posterior supraglottic swelling. CT neck  showed left lateral supraglottic laryngeal fluid collection thought to be abscess with reactive appearing left level II/III and right paratracheal lymph nodes. ENT re-consulted performing laryngoscopy again 4/2 by Dr. Erik Obey showed significant laryngeal edema precluding visualization of vocal cords/subglottic patency but no abscess.  - Continue trach collar, PMV as tolerated. - ENT following patient, planning repeat laryngoscopy evaluation next week.  - WOC and nutrition consulted for wound care instructions and optimizing nutritional status for wound healing. Appreciate their recommendations. - Respiratory culture 3/23 consistent with respiratory flora; repeat 3/30 grew moderate group C streptococcus and unspeciated rare GNRs. Changed clindamycin to zosyn 4/4 to additionally cover GNRs. PCN allergy noted, no reaction with zosyn.  Headache: Possibly related to OD, current medications, depression, TMJ. No red flags or indications for imaging.  - Continue percocet, fentanyl prn severe pain, ibuprofen prn headache. Trial triptan prn refractory headache improved symptoms. Discussed that I suspect this is primarily due to medication overuse. Pt agrees to attempt to wean.  Intentional overdose with suicidal ideation, depression: To inpatient psychiatry hospital when medically stable.  - Resumed wellbutrin, gabapentin, trazodone.  - Continue sitter, suicide precautions. If supervision can be arranged, the patient may be transported to the sun room briefly to help with mood. - Appreciate psychiatry consultation, last seen 4/5, unchanged recommendations.  Acute kidney injury: Resolved. Monitor with reinitiation of NSAID, diuresis.   Diabetes mellitus, type II with hyperglycemia: Last HbA1c 7.2%, possibly some hyperglycemia from steroids. Near inpatient goal.  - Continue Lantus at bedtime > increase to 45u nightly [home dose 28 70/30 twice daily], continue 6u mealtime, and resistant SSI - Holding  metformin 1000 twice daily, Actos 15 daily   Hyperkalemia: 4/5, 5.8 in the absence of renal dysfunction. Resolved with kayexalate, lasix.  - Monitoring telemetry  Pulmonary edema: Has been intermittent issue throughout hospitalization.  Net positive during hospitalization - Resolved. May require intermittent lasix.  - Monitor I/O  Venous insufficiency: Suspected cause of mild LE swelling with is intermittent and chronic. No evidence of DVT on U/S 4/5 somewhat limited by habitus, but no definite clinical evidence of DVT either. - TED hose, elevate extremities, ambulate as able.  - Counseled that weight is contributing and that lasix is not the answer.  Hypomagnesemia: Resolved with repletion.   Seizure disorder: Stable, no activity.  - Continue gabapentin, and, since no seizures and has been on wellbutrin will continue this. 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   Hypothyroidism: TSH 3.587 - Continue synthroid   Acute toxic encephalopathy: Due to OD. UDS and CT head negative. Resolved.   Shortness of breath and chest pain: Suspect anxiety component. Resolved with trach placement. Workup at time chest x-ray, EKG, troponin, d-dimer unremarkable. Resolved.  Hypotension, sinus tachycardia - Transitioned 3/29 to metoprolol 12.5 twice daily. Stable.  DVT prophylaxis: Lovenox Code Status: Full Family Communication: Husband at bedside Disposition Plan: Inpatient psychiatry when medically stable. Still having tracheostomy issues precluding transfer. Plan to repeat laryngoscopy next week. Uncertain expected date of stability.   Consultants:   CCM  Psychiatry  ENT  Procedures:  Intubation/extubation Renal US Transnasal fiberoptic laryngoscopy x 2 Tracheostomy and direct laryngoscopy   Antimicrobials:  Clindamycin 3/26 - 4/4  Zosyn 4/4 >>   Subjective: Wants to go outside. Says room is driving her crazy, worsening depression.  Breathing ok, no chest pain. Swelling in legs is down but not completely gone. Headache stable.   Objective: Vitals:   12/15/17 0608 12/15/17 0833 12/15/17 1212 12/15/17 1319  BP:    104/60  Pulse: 86 85 82   Resp: 16 16 (!) 21   Temp:      TempSrc:      SpO2: 98% 97% 98%   Weight:      Height:        Intake/Output Summary (Last 24 hours) at 12/15/2017 1428 Last data filed at 12/15/2017 1220 Gross per 24 hour  Intake 1300 ml  Output -  Net 1300 ml   Filed Weights   12/13/17 0400 12/14/17 0300 12/15/17 0451  Weight: (!) 145.6 kg (320 lb 15.8 oz) (!) 141.2 kg (311 lb 4.6 oz) (!) 144.5 kg (318 lb 9 oz)    Gen: Morbidly obese 42 y.o. female in no distress. Cushingoid habitus. Neck: Large. Trach collar with tracheostomy site discharging malodorous thick brown secretions onto dressing and in canister. Tender to light palpation without periwound fluctuance.  Pulm: Non-labored, normal rate, through trach on humidified O2. Distant but clear throughout. CV: Regular rate and rhythm. No murmur, rub, or gallop. Unable to appreciate JVD. GI: Abdomen soft, non-tender, non-distended, with normoactive bowel sounds. No organomegaly or masses felt. Ext: Warm, no deformities. Trace pitting LE pedal edema extending to ankles without erythema, palpable cords, varicosites, or focal tenderness. Homan's equivocal bilaterally. Skin: No wounds.  Neuro: Alert and oriented. No aphasia, speaks with PMV and writing. No focal CN or peripheral nerve deficits. Psych: Very depressed with congruent, broadened affect.   CBC: Recent Labs  Lab 12/09/17 0329 12/10/17 0330 12/13/17 0605  WBC 10.1 9.6 8.3  NEUTROABS 6.4 6.7  --   HGB 11.7* 10.8* 10.8*  HCT 37.1 34.7* 34.2*  MCV 90.0 89.7 91.0  PLT 451* 434* 161*   Basic Metabolic Panel: Recent Labs  Lab 12/09/17 0329 12/10/17 0330 12/13/17 0960 12/13/17 1517 12/14/17 0552 12/15/17 0430  NA 133* 136 136  --  136 137  K 4.2 4.5 5.8* 5.8* 5.2* 4.7  CL  98* 100* 102  --  101 99*  CO2 25 26 26   --  24 28  GLUCOSE 206* 171* 205*  --  178* 174*  BUN 26* 18 15  --  15 16  CREATININE 1.05* 0.74 0.85  --  0.97 1.01*  CALCIUM 8.7* 8.9 9.1  --  8.9 8.8*  MG 2.0  --   --   --   --   --   PHOS 3.4 3.3  --   --   --   --    Liver Function Tests: Recent Labs  Lab 12/09/17 0329 12/10/17 0330  ALBUMIN 2.3* 2.0*   CBG: Recent Labs  Lab 12/14/17 1113 12/14/17 1628 12/14/17 2231 12/15/17 0602 12/15/17 1106  GLUCAP 219* 149* 148* 158* 272*   Urine analysis:    Component Value Date/Time   COLORURINE RED (A) 10/31/2016 2130   APPEARANCEUR CLOUDY (A) 10/31/2016 2130   LABSPEC 1.027 10/31/2016 2130   PHURINE 6.0 10/31/2016 2130   GLUCOSEU >=500 (A) 10/31/2016 2130   HGBUR LARGE (A) 10/31/2016 2130   BILIRUBINUR NEGATIVE 10/31/2016 2130   [place] NEGATIVE 10/31/2016 2130   PROTEINUR 100 (A) 10/31/2016 2130   UROBILINOGEN 0.2 04/09/2014 1157   NITRITE NEGATIVE 10/31/2016 2130   LEUKOCYTESUR NEGATIVE 10/31/2016 2130   Recent Results (from the past 240 hour(s))  Culture, expectorated sputum-assessment     Status: None   Collection Time: 12/07/17  5:18 AM  Result Value Ref Range Status   Specimen Description EXPECTORATED SPUTUM  Final   Special Requests NONE  Final   Sputum evaluation   Final    THIS SPECIMEN IS ACCEPTABLE FOR SPUTUM CULTURE Performed at [facility], [place] [address], [place], [place] [address]    Report Status 12/09/2017 FINAL  Final  Culture, respiratory (NON-Expectorated)     Status: None   Collection Time: 12/07/17  5:18 AM  Result Value Ref Range Status   Specimen Description EXPECTORATED SPUTUM  Final   Special Requests NONE Reflexed from [id number]  Final   Gram Stain   Final    FEW WBC PRESENT, PREDOMINANTLY PMN FEW GRAM POSITIVE COCCI RARE GRAM NEGATIVE RODS Performed at [facility], [place] [address] [place], [place] [address]    Culture MODERATE STREPTOCOCCUS GROUP C  Final   Report Status  12/09/2017 FINAL  Final      Radiology Studies: No results found.  Scheduled Meds: . buPROPion  100 mg Oral BID  . chlorhexidine  15 mL Mouth Rinse BID  . cholestyramine  4 g Oral QODAY  . enoxaparin (LOVENOX) injection  70 mg Subcutaneous Q24H  . feeding supplement (PRO-STAT SUGAR FREE 64)  30 mL Oral BID  . gabapentin  300 mg Oral BID  . insulin aspart  0-20 Units Subcutaneous TID WC  . insulin aspart  6 Units Subcutaneous TID WC  . insulin glargine  40 Units Subcutaneous QHS  . levothyroxine  75 mcg Oral QAC breakfast  . mouth rinse  15 mL Mouth Rinse q12n4p  . metoprolol tartrate  12.5 mg Oral BID  . pantoprazole  40 mg Oral BID AC  . Racepinephrine HCl  0.5 mL Nebulization Once   Continuous Infusions: . piperacillin-tazobactam (ZOSYN)  IV 3.375 g (12/15/17 1137)     LOS: 25 days   Time spent: 25 minutes.  Vance Gather, MD [facility] Pager  [phone]  If 7PM-7AM, please contact night-coverage [email or web address] Password [email or web address] 12/15/2017, 2:28 PM

## 2017-12-15 NOTE — Progress Notes (Signed)
Pharmacy Antibiotic Note  [name] is a 42 y.o. female admitted on 11/20/2017 with wound infection.  Pharmacy has been consulted for Zosyn dosing.  Patient previously on clindamycin for laryngeal abscess- broadening coverage to have better coverage for GNR.  Good renal function; WBC now normal, afebrile.  Plan: Continue Zosyn 3.375g IV q8h EI No further dose adjustments anticipated. Rx will sign off.  Height: 5\' 2"  (157.5 cm) Weight: (!) 318 lb 9 oz (144.5 kg) IBW/kg (Calculated) : 50.1  Temp (24hrs), Avg:98.4 F (36.9 C), Min:98.1 F (36.7 C), Max:98.7 F (37.1 C)  Recent Labs  Lab 12/09/17 0329 12/10/17 0330 12/13/17 0605 12/14/17 0552 12/15/17 0430  WBC 10.1 9.6 8.3  --   --   CREATININE 1.05* 0.74 0.85 0.97 1.01*    Estimated Creatinine Clearance: 99.7 mL/min (A) (by C-G formula based on SCr of 1.01 mg/dL (H)).    Allergies  Allergen Reactions  . Penicillins Hives    Has patient had a PCN reaction causing immediate rash, facial/tongue/throat swelling, SOB or lightheadedness with hypotension: Yes Has patient had a PCN reaction causing severe rash involving mucus membranes or skin necrosis: Yes Has patient had a PCN reaction that required hospitalization No Has patient had a PCN reaction occurring within the last 10 years: No If all of the above answers are "NO", then may proceed with Cephalosporin use. TOLERATED ZOSYN 12/12/17    Clindamycin 3/26 >> 4/4 Zosyn 4/4>>  3/21 MRSA PCR - negaive 3/23 TA - negative 3/30 sputum - Group C Strep, also with rare gram negative rods  Thank you for allowing pharmacy to be a part of this patient's care.  [employer], Pharm.D., BCPS Clinical Pharmacist Pager: [phone] Clinical phone for 12/15/2017 from 8:30-4:00 is [phone]. After 4pm, please call Main Rx [phone]) for assistance. 12/15/2017 2:35 PM

## 2017-12-16 LAB — GLUCOSE, CAPILLARY
GLUCOSE-CAPILLARY: 161 mg/dL — AB (ref 65–99)
GLUCOSE-CAPILLARY: 156 mg/dL — AB (ref 65–99)
Glucose-Capillary: 163 mg/dL — ABNORMAL HIGH (ref 65–99)
Glucose-Capillary: 211 mg/dL — ABNORMAL HIGH (ref 65–99)

## 2017-12-16 MED ORDER — GERHARDT'S BUTT CREAM
TOPICAL_CREAM | Freq: Every day | CUTANEOUS | Status: DC
  Administered 2017-12-16 – 2018-01-07 (×19): via TOPICAL
  Filled 2017-12-16: qty 1

## 2017-12-16 MED ORDER — INSULIN ASPART 100 UNIT/ML ~~LOC~~ SOLN
8.0000 [IU] | Freq: Three times a day (TID) | SUBCUTANEOUS | Status: DC
  Administered 2017-12-16 – 2018-01-07 (×59): 8 [IU] via SUBCUTANEOUS

## 2017-12-16 MED ORDER — GABAPENTIN 300 MG PO CAPS
600.0000 mg | ORAL_CAPSULE | Freq: Two times a day (BID) | ORAL | Status: DC
  Administered 2017-12-16 – 2018-01-07 (×44): 600 mg via ORAL
  Filled 2017-12-16 (×46): qty 2

## 2017-12-16 NOTE — Progress Notes (Signed)
PROGRESS NOTE  Tae Robak Duarte  ACZ:[id number] DOB: [birth year] DOA: 11/20/2017 PCP: Sandi Mariscal, MD   Brief Narrative: MISTEE SOLIMAN is a 42 y.o. female with a history of morbid obesity (BMI 69), IDDM, OSA, HTN, Cushing's disease and depression who presented 3/13 after suicide attempt by intentional overdose of baclofen, trazodone, and tramadol. She reportedly was encephalopathic and experienced a seizure, intubated for airway protection and admitted to the ICU. After extubation she developed stridor initially responsive to racemic epinephrine with laryngoscopy revealing fixed vocal cords, though when this recurred 3/21 it was unresponsive necessitating tracheostomy with laryngoscopy revealing circumferential subglottic swelling with necrotic debris and fibrinous exudate. Respiratory status has stabilized, though she continued to have significant secretions with odor. Clindamycin was started 3/27 and ENT reevaluated the patient 4/2 due to CT showing left paralaryngeal abscess. Flexible laryngoscopy was repeated showing pooled secretions in pharynx, and inability to assess vocal cord mobility or subglottic patency due to poor laryngeal airway. Psychiatry has continued to recommend transfer to inpatient psychiatry for suicidal ideation and attempt when medically stable.  Assessment & Plan: Principal Problem:   Severe episode of recurrent major depressive disorder, without psychotic features ([place]) Active Problems:   Intentional overdose of drug in tablet form ([place])   Status epilepticus ([place])   Shortness of breath   Hoarse voice quality   Hypomagnesemia   Acute respiratory failure with hypoxemia (H[facility])   SOB (shortness of breath)   Tracheostomy status ([facility])  Acute respiratory failure due to overdose now s/p tracheostomy secondary to glottic obstruction, fixed vocal cords s/p intubation and wound infection: s/p tracheostomy 11/28/17 with laryngoscopy 3/19 showing posterior supraglottic swelling. CT neck  showed left lateral supraglottic laryngeal fluid collection thought to be abscess with reactive appearing left level II/III and right paratracheal lymph nodes. ENT re-consulted performing laryngoscopy again 4/2 by Dr. Erik Obey showed significant laryngeal edema precluding visualization of vocal cords/subglottic patency but no abscess.  - Continue trach collar, PMV as tolerated. - ENT following patient, planning repeat laryngoscopy evaluation this week - WOC and nutrition consulted for wound care instructions and optimizing nutritional status for wound healing. Appreciate their recommendations. - Respiratory culture 3/23 consistent with respiratory flora; repeat 3/30 grew moderate group C streptococcus and unspeciated rare GNRs. Changed clindamycin to zosyn 4/4 to additionally cover GNRs. PCN allergy noted, no reaction with zosyn  Headache: Possibly related to OD, current medications, depression, TMJ. No red flags or indications for imaging.  - Continue percocet, fentanyl prn severe pain, ibuprofen prn headache. Trial triptan prn refractory headache improved symptoms. Discussed that I suspect this is primarily due to medication overuse. Pt agrees to attempt to wean.  Intentional overdose with suicidal ideation, depression: To inpatient psychiatry hospital when medically stable.  - Resumed wellbutrin, gabapentin (will increase back to home dose now that this has been titrated back), trazodone.  - Continue sitter, suicide precautions. If supervision can be arranged, the patient may be transported to the sun room briefly to help with mood. - Appreciate psychiatry consultation, last seen 4/5, unchanged recommendations.  Acute kidney injury: Resolved. Monitor intermittently.   Diabetes mellitus, type II with hyperglycemia: Last HbA1c 7.2%, possibly some hyperglycemia from steroids. Near inpatient goal.  - Continue Lantus at bedtime > increased to 45u nightly [home dose 28 70/30 twice daily]. CBGs  rising, increase mealtime insulin 6u > 8u, and resistant SSI - Holding metformin 1000 twice daily, Actos 15 daily   Hyperkalemia: 4/5, 5.8 in the absence of renal dysfunction. Resolved with kayexalate, lasix.  -  Monitoring telemetry  Pulmonary edema: Has been intermittent issue throughout hospitalization. Net positive during hospitalization - Resolved. May require intermittent lasix.  - Monitor I/O  Venous insufficiency: Suspected cause of mild LE swelling with is intermittent and chronic. No evidence of DVT on U/S 4/5 somewhat limited by habitus, but no definite clinical evidence of DVT either. - TED hose, elevate extremities, ambulate as able.  - Counseled that weight is contributing and that lasix is not the answer.  Hypomagnesemia: Resolved with repletion.   Seizure disorder: Stable, no activity.  - Continue gabapentin, and, since no seizures and has been on wellbutrin will continue this. 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   Hypothyroidism: TSH 3.587 - Continue synthroid   Acute toxic encephalopathy: Due to OD. UDS and CT head negative. Resolved.   Shortness of breath and chest pain: Suspect anxiety component. Resolved with trach placement. Workup at time chest x-ray, EKG, troponin, d-dimer unremarkable. Resolved.  Hypotension, sinus tachycardia - Transitioned 3/29 to metoprolol 12.5 twice daily. Stable.  DVT prophylaxis: Lovenox Code Status: Full Family Communication: Husband at bedside Disposition Plan: Inpatient psychiatry when medically stable. Still having tracheostomy issues precluding transfer. Plan to repeat laryngoscopy next week. Uncertain expected date of stability.   Consultants:   CCM  Psychiatry  ENT  Procedures:  Intubation/extubation Renal US Transnasal fiberoptic laryngoscopy x 2 Tracheostomy and direct laryngoscopy   Antimicrobials:  Clindamycin 3/26 - 4/4  Zosyn 4/4 >>   Subjective: Said a  short trip near the windows on the 2nd floor made her day yesterday. Much more interactive today. Headache resolved. No dyspnea or chest pain. Eager to have laryngoscopy and says she must be "knocked out."   Objective: Vitals:   12/16/17 1023 12/16/17 1205 12/16/17 1225 12/16/17 1227  BP:   (!) 111/49 98/83  Pulse: 89 84 94 100  Resp: 20 18 18    Temp:   98.6 F (37 C)   TempSrc:   Oral   SpO2: 100% 100% 100%   Weight:      Height:        Intake/Output Summary (Last 24 hours) at 12/16/2017 1446 Last data filed at 12/16/2017 1200 Gross per 24 hour  Intake 1260 ml  Output -  Net 1260 ml   Filed Weights   12/14/17 0300 12/15/17 0451 12/16/17 0500  Weight: (!) 141.2 kg (311 lb 4.6 oz) (!) 144.5 kg (318 lb 9 oz) (!) 148.5 kg (327 lb 6.1 oz)    Gen: Morbidly obese, pleasant 42 y.o. female in no distress. Neck: Large. Tracheostomy site discharging tan secretions, Tender to light palpation without periwound fluctuance.  Pulm: Non-labored, normal rate, through trach with supplemental O2. Clear with good air movement. CV: Regular rate and rhythm, borderline tachycardia. No murmur, rub, or gallop. Unable to appreciate JVD. GI: Abdomen soft, non-tender, non-distended, with normoactive bowel sounds. No organomegaly or masses felt. Ext: Warm, no deformities. Trace pitting LE pedal edema extending to ankles without erythema, palpable cords, varicosites, or focal tenderness. Homan's negative. Skin: No wounds.  Neuro: Alert and oriented. No aphasia, speaks with PMV and writing. No focal CN or peripheral nerve deficits. Psych: Depressed mood with broadening, full affect. Denies SI, HI.  CBC: Recent Labs  Lab 12/10/17 0330 12/13/17 0605  WBC 9.6 8.3  NEUTROABS 6.7  --   HGB 10.8* 10.8*  HCT 34.7* 34.2*  MCV 89.7 91.0  PLT 434* 323*   Basic Metabolic Panel: Recent Labs  Lab 12/10/17 0330 12/13/17 0605 12/13/17  1517 12/14/17 0552 12/15/17 0430  NA 136 136  --  136 137  K 4.5 5.8*  5.8* 5.2* 4.7  CL 100* 102  --  101 99*  CO2 26 26  --  24 28  GLUCOSE 171* 205*  --  178* 174*  BUN 18 15  --  15 16  CREATININE 0.74 0.85  --  0.97 1.01*  CALCIUM 8.9 9.1  --  8.9 8.8*  PHOS 3.3  --   --   --   --    Liver Function Tests: Recent Labs  Lab 12/10/17 0330  ALBUMIN 2.0*   CBG: Recent Labs  Lab 12/15/17 1106 12/15/17 1629 12/15/17 2156 12/16/17 0617 12/16/17 1122  GLUCAP 272* 225* 207* 163* 211*   Urine analysis:    Component Value Date/Time   COLORURINE RED (A) 10/31/2016 2130   APPEARANCEUR CLOUDY (A) 10/31/2016 2130   LABSPEC 1.027 10/31/2016 2130   PHURINE 6.0 10/31/2016 2130   GLUCOSEU >=500 (A) 10/31/2016 2130   HGBUR LARGE (A) 10/31/2016 2130   BILIRUBINUR NEGATIVE 10/31/2016 2130   [place] NEGATIVE 10/31/2016 2130   PROTEINUR 100 (A) 10/31/2016 2130   UROBILINOGEN 0.2 04/09/2014 1157   NITRITE NEGATIVE 10/31/2016 2130   LEUKOCYTESUR NEGATIVE 10/31/2016 2130   Recent Results (from the past 240 hour(s))  Culture, expectorated sputum-assessment     Status: None   Collection Time: 12/07/17  5:18 AM  Result Value Ref Range Status   Specimen Description EXPECTORATED SPUTUM  Final   Special Requests NONE  Final   Sputum evaluation   Final    THIS SPECIMEN IS ACCEPTABLE FOR SPUTUM CULTURE Performed at [facility], [place] [address] [place], [place] [address]    Report Status 12/09/2017 FINAL  Final  Culture, respiratory (NON-Expectorated)     Status: None   Collection Time: 12/07/17  5:18 AM  Result Value Ref Range Status   Specimen Description EXPECTORATED SPUTUM  Final   Special Requests NONE Reflexed from [id number]  Final   Gram Stain   Final    FEW WBC PRESENT, PREDOMINANTLY PMN FEW GRAM POSITIVE COCCI RARE GRAM NEGATIVE RODS Performed at [facility], [place] [address] [place], [place] [address]    Culture MODERATE STREPTOCOCCUS GROUP C  Final   Report Status 12/09/2017 FINAL  Final      Radiology Studies: No results  found.  Scheduled Meds: . buPROPion  100 mg Oral BID  . chlorhexidine  15 mL Mouth Rinse BID  . cholestyramine  4 g Oral QODAY  . enoxaparin (LOVENOX) injection  70 mg Subcutaneous Q24H  . feeding supplement (PRO-STAT SUGAR FREE 64)  30 mL Oral BID  . gabapentin  600 mg Oral BID  . Gerhardt's butt cream   Topical Daily  . insulin aspart  0-20 Units Subcutaneous TID WC  . insulin aspart  6 Units Subcutaneous TID WC  . insulin glargine  40 Units Subcutaneous QHS  . levothyroxine  75 mcg Oral QAC breakfast  . mouth rinse  15 mL Mouth Rinse q12n4p  . metoprolol tartrate  12.5 mg Oral BID  . pantoprazole  40 mg Oral BID AC  . Racepinephrine HCl  0.5 mL Nebulization Once   Continuous Infusions: . piperacillin-tazobactam (ZOSYN)  IV 3.375 g (12/16/17 1136)     LOS: 26 days   Time spent: 25 minutes.  Vance Gather, MD [facility] Pager [phone]  If 7PM-7AM, please contact night-coverage [email or web address] Password [facility] 12/16/2017, 2:46 PM

## 2017-12-16 NOTE — Consult Note (Addendum)
[facility] Nurse wound follow up Wound type: Trach wound related to infectious process Measurement: 2.5 cm x 2.8 cm x unknown depth Wound bed: 100% necrotic slough inferiorly to the trach Drainage (amount, consistency, odor) No odor detected, moderate amount of drainage. Periwound: Erythematous, crusting Dressing procedure/placement/frequency: Apply Gerhardt butt cream to area daily. Place Aquacell Ag+ to wound surface, then place a foam dressing under the trach faceplate.  Change daily. Val Riles, RN, MSN, CWOCN, CNS-BC, pager [phone]

## 2017-12-16 NOTE — Progress Notes (Signed)
Left upperarm midline came out accidentally when about to change dressing. IV team attempting to start PIV.

## 2017-12-16 NOTE — Progress Notes (Signed)
Physical Therapy Treatment Patient Details Name: [name] MRN: [id number] DOB: 1976/06/19 Today's Date: 12/16/2017    History of Present Illness Patient is a 42 y/o female who presents with drug OD and Sz. Intubated 3/13-3/15. resp failure with emergent trach 3/21 due to vocal cord dysfunction. CT of throat- suggests left paralaryngeal abscess. PMH includes ORIF RLE, depression, anxiety, morbid obesity, DM, Cushing's, Rt uni knee replacement, HTN, left ankle surgery, dyslipidemia, hypothyroidism.     PT Comments    Pt is progressing well towards gait and mobility goals.  She was able to walk without the RW reaching at times for the hallway railing for support.  She enjoyed going off the unit (not charged) for a short amount of time today as she has orders allowing her to do so now.  PMV used throughout the session without difficulty and pt is actually talking around the trach (I could understand her with PMV off as well).  She was much more animated and less flat with her affect today, showing remorse for what she did and frustration with not knowing how long she will be in the hospital.  Goal update is due next session.     Follow Up Recommendations  Supervision for mobility/OOB;Other (comment)(plan is for inpatient psych)     Equipment Recommendations  None recommended by PT    Recommendations for Other Services   NA     Precautions / Restrictions Precautions Precautions: Fall Precaution Comments: tachycardia; suicide precautions, trach    Mobility  Bed Mobility               General bed mobility comments: Pt was OOB in the recliner chair.   Transfers Overall transfer level: Needs assistance   Transfers: Sit to/from Stand Sit to Stand: Min guard         General transfer comment: Min guard assist for safety during transitions, especially without AD today.   Ambulation/Gait Ambulation/Gait assistance: Min assist;Min guard;+2 safety/equipment Ambulation Distance  (Feet): 300 Feet(with one seated rest break) Assistive device: None(at times reaching for hallway railing for some light stabili) Gait Pattern/deviations: Step-through pattern;Staggering left;Staggering right Gait velocity: decreased Gait velocity interpretation: Below normal speed for age/gender General Gait Details: Pt with staggering gait pattern, but honestly, I believe that to partially be due to body habitus and is likely baseline.  She did feel better keeping a light hand on the railing at first and was able to walk and talk a good distance before getting too dyspnic and needing a seated rest break for ~5 mins and then we walked again.  Chair to follow for safety.  Pt's FIO2 was 30% on 4-6 L O2 (6L initially during rest break due to DOE).            Balance Overall balance assessment: Needs assistance Sitting-balance support: Feet supported;No upper extremity supported Sitting balance-Leahy Scale: Good     Standing balance support: No upper extremity supported Standing balance-Leahy Scale: Fair                              Cognition Arousal/Alertness: Awake/alert Behavior During Therapy: WFL for tasks assessed/performed Overall Cognitive Status: Within Functional Limits for tasks assessed                                 General Comments: Pt is much more animated today compared to when I  saw her two weeks ago.  She is joking with me and laughing at times.  She is much less sinical and saying things like, "what was I thinking?" when referring to her suicide attempt.        Exercises      General Comments General comments (skin integrity, edema, etc.): Pt opened up a lot this session about feeling like she is in jail with no clear end-point in site, no goal to work towards to get her closer to leaving the hospital.  She was excited to get out of the room and do something different (she went out yesterday as well).        Pertinent Vitals/Pain Pain  Assessment: Faces Faces Pain Scale: Hurts little more Pain Location: mid to upper back at "bulging disc" area per pt Pain Descriptors / Indicators: Discomfort Pain Intervention(s): Limited activity within patient's tolerance;Monitored during session;Repositioned           PT Goals (current goals can now be found in the care plan section) Acute Rehab PT Goals Patient Stated Goal: to get stronger and be able to go home Progress towards PT goals: Progressing toward goals    Frequency    Min 3X/week      PT Plan Current plan remains appropriate       AM-PAC PT "6 Clicks" Daily Activity  Outcome Measure  Difficulty turning over in bed (including adjusting bedclothes, sheets and blankets)?: None Difficulty moving from lying on back to sitting on the side of the bed? : None Difficulty sitting down on and standing up from a chair with arms (e.g., wheelchair, bedside commode, etc,.)?: None Help needed moving to and from a bed to chair (including a wheelchair)?: None Help needed walking in hospital room?: A Little Help needed climbing 3-5 steps with a railing? : A Little 6 Click Score: 22    End of Session Equipment Utilized During Treatment: Oxygen Activity Tolerance: Patient limited by fatigue;Patient limited by pain Patient left: in chair;with call bell/phone within reach;with nursing/sitter in room   PT Visit Diagnosis: Muscle weakness (generalized) (M62.81);Unsteadiness on feet (R26.81);Difficulty in walking, not elsewhere classified (R26.2)     Time: 1630-1700 PT Time Calculation (min) (ACUTE ONLY): 30 min  Charges:  $Gait Training: 23-37 mins          Ailene Royal B. Hulbert, [place], DPT [phone]            12/16/2017, 5:51 PM

## 2017-12-16 NOTE — Progress Notes (Signed)
CSW following for discharge plan. Patient remains not medically ready for transfer to inpatient psych at this time.  CSW will continue to follow.  Laveda Abbe,  Clinical Social Worker [phone]

## 2017-12-16 NOTE — Progress Notes (Signed)
  Speech Language Pathology Treatment: [name] Speaking valve  Patient Details Name: [name] MRN: [id number] DOB: 1976-02-01 Today's Date: 12/16/2017 Time: 2876-8115 SLP Time Calculation (min) (ACUTE ONLY): 21 min  Assessment / Plan / Recommendation Clinical Impression  [name] upright in chair, interactive and pleasant with SLP. Patient able to independently demonstrate placement and removal of valve with the exception of requiring reminders for check for cuff deflated prior to valve placement. Trace air noted in cuff which was removed by SLP prior to putting PMV in place. Patient able to wear today without evidence of distress or change in vital signs, no evidence of air trapping noted with valve removal. Vocal quality improving from when seen last by this SLP in the weeks prior with mild hoarse vocal quality and low intensity but 100% intelligible. Overall, tolerating well. MD, consider changing to a cuffless trach prior to discharge to facilitate safety with valve use. Will f/u.    HPI HPI: Patient is a 42 y/o female who presents with drug OD and Sz. Intubated 3/13-3/15. resp failure with emergent trach 3/21.  ENT flexible laryngoscopy revealed posterior supraglottic swelling; swollen vocal folds with no abduction; edematous arytenoids; entire subglottis circumferentially swollen with necrotic debris and fibrinous exudate.       SLP Plan  Continue with current plan of care       Recommendations         Patient may use Passy-Muir Speech Valve: During all waking hours (remove during sleep) PMSV Supervision: Intermittent MD: Please consider changing trach tube to : Smaller size;Cuffless         Oral Care Recommendations: Oral care BID SLP Visit Diagnosis: Aphonia (R49.1) Plan: Continue with current plan of care       [name], [name] [phone]    [name] 12/16/2017, 1:41 PM

## 2017-12-17 LAB — GLUCOSE, CAPILLARY
GLUCOSE-CAPILLARY: 143 mg/dL — AB (ref 65–99)
GLUCOSE-CAPILLARY: 106 mg/dL — AB (ref 65–99)
Glucose-Capillary: 215 mg/dL — ABNORMAL HIGH (ref 65–99)
Glucose-Capillary: 220 mg/dL — ABNORMAL HIGH (ref 65–99)
Glucose-Capillary: 178 mg/dL — ABNORMAL HIGH (ref 65–99)
Glucose-Capillary: 154 mg/dL — ABNORMAL HIGH (ref 65–99)

## 2017-12-17 NOTE — Progress Notes (Addendum)
Occupational Therapy Treatment Patient Details Name: [name] MRN: [id number] DOB: 11/12/1975 Today's Date: 12/17/2017    History of present illness Patient is a 42 y/o female who presents with drug OD and Sz. Intubated 3/13-3/15. resp failure with emergent trach 3/21 due to vocal cord dysfunction. CT of throat- suggests left paralaryngeal abscess. PMH includes ORIF RLE, depression, anxiety, morbid obesity, DM, Cushing's, Rt uni knee replacement, HTN, left ankle surgery, dyslipidemia, hypothyroidism.    OT comments  Focus of session on active listening and teaching patient visualization/guided imagery and deep breathing relaxation techniques.Self reported anxiety 8 at beginning of session and 4 following intervention. Pt very open during this session talking about many stress factors in her life, including her "attempted suicide". Pt was very tearful during the session, especially when talking about her nephew "Theodoro Clock" who was "murdered 16 months ago". Pt states he was "like her best friend" and that she has not really "dealt with it". States that everything just finally "got to her and it was a fluke, something I would never do", "it was stupid", when talking about taking her medication. "I don't think I can ever forgive myself for doing that". Pt also talked about the anxiety she feels about her upcoming surgery tomorrow. Pt very appreciative. Will continue to follow acutely.   Follow Up Recommendations  Other (comment)(Behavioral health - OT)    Equipment Recommendations       Recommendations for Other Services      Precautions / Restrictions Precautions Precautions: Fall Precaution Comments: tachycardia; suicide precautions, trach       Mobility Bed Mobility                  Transfers                      Balance                                           ADL either performed or assessed with clinical judgement   ADL                                                Vision       Perception     Praxis      Cognition Arousal/Alertness: Awake/alert Behavior During Therapy: anxious -  Pt more verbal and talking extensively about "taking her medication on that day". Pt initiated the conversation and was openly talking about what she felt lead up to this event, which includes but not limited to poor support from her husband, having primary responsibility for her child with Autism and dealing with the death of her 59 yo nephew. "It was just too much and I did something so stupid". Pt states that her husband and mother-in-law say that "Theodoro Clock went to hell" and "I just can't believe that". Pt apparently dealing with guilt and fear that her nephew that she loved so much went to [place].  Pt able to use reason to talk about Jamison's goodness and sweet characteristics and feels herself that Theodoro Clock did not go to [place]. Pt encouraged to believe her own thoughts as she knew Theodoro Clock better "than anyone else" Overall Cognitive Status: Within Functional Limits for tasks assessed  Exercises     Shoulder Instructions       General Comments  Pt taught deep breathing techniques, progressive contract/relax of muscle groups form shoulders - hands, followed by visualization/guided imagery of [place]     Pertinent Vitals/ Pain       Pain Assessment: Faces Faces Pain Scale: No hurt  Home Living                                          Prior Functioning/Environment              Frequency  Min 2X/week        Progress Toward Goals  OT Goals(current goals can now be found in the care plan section)  Progress towards OT goals: Progressing toward goals  Acute Rehab OT Goals Patient Stated Goal: to forgive herself OT Goal Formulation: With patient Time For Goal Achievement: 12/18/17 Potential to Achieve Goals:  Good ADL Goals Pt Will Perform Grooming: with supervision;standing Pt Will Perform Upper Body Bathing: with set-up;with supervision;sitting Pt Will Perform Lower Body Bathing: with min guard assist;with adaptive equipment;sit to/from stand Pt Will Transfer to Toilet: with supervision;ambulating;regular height toilet;grab bars Pt Will Perform Toileting - Clothing Manipulation and hygiene: with min guard assist;sit to/from stand Pt/caregiver will Perform Home Exercise Program: Increased strength;Both right and left upper extremity;With theraband;With written HEP provided;With Supervision Additional ADL Goal #3: Pt will demonstrate  a self paced relazation technique to reduce anxiety symptoms  Plan Discharge plan remains appropriate;Frequency needs to be updated    Co-evaluation                 AM-PAC PT "6 Clicks" Daily Activity     Outcome Measure   Help from another person eating meals?: None Help from another person taking care of personal grooming?: None Help from another person toileting, which includes using toliet, bedpan, or urinal?: A Little Help from another person bathing (including washing, rinsing, drying)?: A Little Help from another person to put on and taking off regular upper body clothing?: A Little Help from another person to put on and taking off regular lower body clothing?: A Little 6 Click Score: 20    End of Session Equipment Utilized During Treatment: Oxygen  OT Visit Diagnosis: Muscle weakness (generalized) (M62.81)   Activity Tolerance Patient tolerated treatment well   Patient Left in chair;with call bell/phone within reach;with nursing/sitter in room   Nurse Communication Other (comment)(allowable items in room) - asked nursing director about pt having a coloring book and markers in her room to use for relaxation/reducing anxiety. Nursing director received clearance. Also discussed use of music player - pt able to have music player with a speaker  but is unable to have headphones with a cord - pt notified        Time: 1435-1515 OT Time Calculation (min): 40 min  Charges: OT General Charges $OT Visit: 1 Visit OT Treatments $Therapeutic Activity: 38-52 mins  [facility], OT/L  [phone] 12/17/2017   Ronold Hardgrove,HILLARY 12/17/2017, 3:47 PM

## 2017-12-17 NOTE — Progress Notes (Signed)
Patient ID: [name], female   DOB: 04-16-1976, 43 y.o.   MRN: [id number] Awake on rounds, sitting in chair, breathing comfortably.  She is able to phonate nicely with finger occlusion of the tracheostomy.  She is also able to move air reasonably well with no stridor or distress.  We discussed the plan for tomorrow is to go to the operating room and under anesthesia, perform direct laryngoscopy and evaluate the subglottic region.  We discussed that there is a small chance that we may be able to decannulate tomorrow.  She understands and agrees.

## 2017-12-17 NOTE — Progress Notes (Signed)
PROGRESS NOTE  [name] Peer  IRC:[id number] DOB: 07/09/1976 DOA: 11/20/2017 PCP: Sandi Mariscal, MD   Brief Narrative: [name] is a 42 y.o. female with a history of morbid obesity (BMI 12), IDDM, OSA, HTN, Cushing's disease and depression who presented 3/13 after suicide attempt by intentional overdose of baclofen, trazodone, and tramadol. She reportedly was encephalopathic and experienced a seizure, intubated for airway protection and admitted to the ICU. After extubation she developed stridor initially responsive to racemic epinephrine with laryngoscopy revealing fixed vocal cords, though when this recurred 3/21 it was unresponsive necessitating tracheostomy with laryngoscopy revealing circumferential subglottic swelling with necrotic debris and fibrinous exudate. Respiratory status has stabilized, though she continued to have significant secretions with odor. Clindamycin was started 3/27 and ENT reevaluated the patient 4/2 due to CT showing left paralaryngeal abscess. Flexible laryngoscopy was repeated showing pooled secretions in pharynx, and inability to assess vocal cord mobility or subglottic patency due to poor laryngeal airway. Psychiatry has continued to recommend transfer to inpatient psychiatry for suicidal ideation and attempt when medically stable.  Assessment & Plan: Principal Problem:   Severe episode of recurrent major depressive disorder, without psychotic features ([place]) Active Problems:   Intentional overdose of drug in tablet form ([place])   Status epilepticus ([place])   Shortness of breath   Hoarse voice quality   Hypomagnesemia   Acute respiratory failure with hypoxemia ([facility])   SOB (shortness of breath)   Tracheostomy status ([facility])  Acute respiratory failure due to overdose now s/p tracheostomy secondary to glottic obstruction, fixed vocal cords s/p intubation and wound infection: s/p tracheostomy 11/28/17 with laryngoscopy 3/19 showing posterior supraglottic swelling. CT neck  showed left lateral supraglottic laryngeal fluid collection thought to be abscess with reactive appearing left level II/III and right paratracheal lymph nodes. ENT re-consulted performing laryngoscopy again 4/2 by Dr. Erik Obey showed significant laryngeal edema precluding visualization of vocal cords/subglottic patency but no abscess.  - Continue trach collar, PMV as tolerated. - ENT following patient, planning repeat laryngoscopy 4/10. NPO p MN - WOC and nutrition consulted for wound care instructions and optimizing nutritional status for wound healing. Following their recommendations.  - Respiratory culture 3/23 consistent with respiratory flora; repeat 3/30 grew moderate group C streptococcus and unspeciated rare GNRs. Changed clindamycin to zosyn 4/4 to additionally cover GNRs. PCN allergy noted, no reaction with zosyn  Headache: Possibly related to OD, current medications, depression, TMJ. No red flags or indications for imaging.  - Continue percocet, fentanyl prn severe pain, ibuprofen prn headache. Trial triptan prn refractory headache improved symptoms. Discussed that I suspect this is primarily due to medication overuse. Pt agrees to attempt to wean.  Intentional overdose with suicidal ideation, depression: To inpatient psychiatry hospital when medically stable.  - Resumed wellbutrin, gabapentin (titrated back to home dose), trazodone.  - Continue sitter, suicide precautions. If supervision can be arranged, the patient may be transported to the sun room briefly to help with mood. - Appreciate psychiatry consultation, last seen 4/5, unchanged recommendations.  Acute kidney injury: Resolved. Monitor intermittently.   Diabetes mellitus, type II with hyperglycemia: Last HbA1c 7.2%, possibly some hyperglycemia from steroids. Near inpatient goal.  - Continue Lantus at bedtime > increase to 50u nightly [home dose 28 70/30 twice daily]. CBGs rising, increased mealtime insulin 8u, and resistant  SSI.  - Holding metformin 1000 twice daily, Actos 15 daily   Hyperkalemia: 4/5, 5.8 in the absence of renal dysfunction. Resolved with kayexalate, lasix.  - Monitoring telemetry - Recheck BMP  intermittently  Pulmonary edema: Has been intermittent issue throughout hospitalization. Net positive during hospitalization - Resolved. May require intermittent lasix.  - Monitor I/O  Venous insufficiency: Suspected cause of mild LE swelling with is intermittent and chronic. No evidence of DVT on U/S 4/5 somewhat limited by habitus, but no definite clinical evidence of DVT either. - TED hose, elevate extremities, ambulate as able.  - Counseled that weight is contributing and that lasix is not the answer.  Hypomagnesemia: Resolved with repletion.   Seizure disorder: Stable, no activity.  - Continue gabapentin, and, since no seizures and has been on wellbutrin will continue this. 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   Hypothyroidism: TSH 3.587 - Continue synthroid   Acute toxic encephalopathy: Due to OD. UDS and CT head negative. Resolved.   Shortness of breath and chest pain: Suspect anxiety component. Resolved with trach placement. Workup at time chest x-ray, EKG, troponin, d-dimer unremarkable. Resolved.  Hypotension, sinus tachycardia - Transitioned 3/29 to metoprolol 12.5 twice daily. Stable.  DVT prophylaxis: Lovenox Code Status: Full Family Communication: Husband at bedside Disposition Plan: Inpatient psychiatry when medically stable. Still having tracheostomy issues precluding transfer. Plan to repeat laryngoscopy 4/10. Uncertain expected date of stability.   Consultants:   CCM  Psychiatry  ENT  Procedures:  Intubation/extubation Renal US Transnasal fiberoptic laryngoscopy x 2 Tracheostomy and direct laryngoscopy   Antimicrobials:  Clindamycin 3/26 - 4/4  Zosyn 4/4 >>   Subjective: Hopeful for decannulation tomorrow  after procedure. I've tempered these expectations as a small chance, not necessarily likely. No headache or trouble breathing.  Objective: Vitals:   12/17/17 0428 12/17/17 0631 12/17/17 0830 12/17/17 0857  BP: 117/61  122/63   Pulse: 87  72 82  Resp: 20  18 16   Temp: 98.6 F (37 C)  98.7 F (37.1 C)   TempSrc: Oral  Oral   SpO2: 100%  99% 99%  Weight:  (!) 148.7 kg (327 lb 13.2 oz)    Height:        Intake/Output Summary (Last 24 hours) at 12/17/2017 1216 Last data filed at 12/17/2017 0437 Gross per 24 hour  Intake 920 ml  Output -  Net 920 ml   Filed Weights   12/15/17 0451 12/16/17 0500 12/17/17 0631  Weight: (!) 144.5 kg (318 lb 9 oz) (!) 148.5 kg (327 lb 6.1 oz) (!) 148.7 kg (327 lb 13.2 oz)    Gen: Morbidly obese, pleasant 42 y.o. female in no distress sitting in chair Neck: Large. Tracheostomy site discharging tan secretions, Tender to light palpation without periwound fluctuance.  Pulm: Non-labored, normal rate, through trach with supplemental O2. Clear with good air movement. No stridor. Phonates with finger occlusion of trach. CV: Regular rate and rhythm. No murmur, rub, or gallop. Unable to appreciate JVD. GI: Abdomen soft, non-tender, non-distended, with normoactive bowel sounds. No organomegaly or masses felt. Ext: Warm, no deformities. Trace pitting LE pedal edema L > R, minimal without cutaneous changes. Unchanged.   Skin: As above, otherwise, no wounds.  Neuro: Alert and oriented. No aphasia, speaks with PMV and writing. No focal CN or peripheral nerve deficits. Psych: Depressed mood with full affect. Denies SI, HI. Not responding to internal stimuli  CBC: Recent Labs  Lab 12/13/17 0605  WBC 8.3  HGB 10.8*  HCT 34.2*  MCV 91.0  PLT 937*   Basic Metabolic Panel: Recent Labs  Lab 12/13/17 0605 12/13/17 1517 12/14/17 0552 12/15/17 0430  NA 136  --  136  137  K 5.8* 5.8* 5.2* 4.7  CL 102  --  101 99*  CO2 26  --  24 28  GLUCOSE 205*  --  178* 174*    BUN 15  --  15 16  CREATININE 0.85  --  0.97 1.01*  CALCIUM 9.1  --  8.9 8.8*   Liver Function Tests: No results for input(s): AST, ALT, ALKPHOS, BILITOT, PROT, ALBUMIN in the last 168 hours. CBG: Recent Labs  Lab 12/16/17 1635 12/16/17 2210 12/17/17 0628 12/17/17 0854 12/17/17 1128  GLUCAP 161* 156* 143* 215* 220*   Urine analysis:    Component Value Date/Time   COLORURINE RED (A) 10/31/2016 2130   APPEARANCEUR CLOUDY (A) 10/31/2016 2130   LABSPEC 1.027 10/31/2016 2130   PHURINE 6.0 10/31/2016 2130   GLUCOSEU >=500 (A) 10/31/2016 2130   HGBUR LARGE (A) 10/31/2016 2130   BILIRUBINUR NEGATIVE 10/31/2016 2130   KETONESUR NEGATIVE 10/31/2016 2130   PROTEINUR 100 (A) 10/31/2016 2130   UROBILINOGEN 0.2 04/09/2014 1157   NITRITE NEGATIVE 10/31/2016 2130   LEUKOCYTESUR NEGATIVE 10/31/2016 2130   No results found for this or any previous visit (from the past 240 hour(s)).    Radiology Studies: No results found.  Scheduled Meds: . buPROPion  100 mg Oral BID  . chlorhexidine  15 mL Mouth Rinse BID  . cholestyramine  4 g Oral QODAY  . enoxaparin (LOVENOX) injection  70 mg Subcutaneous Q24H  . feeding supplement (PRO-STAT SUGAR FREE 64)  30 mL Oral BID  . gabapentin  600 mg Oral BID  . Gerhardt's butt cream   Topical Daily  . insulin aspart  0-20 Units Subcutaneous TID WC  . insulin aspart  8 Units Subcutaneous TID WC  . insulin glargine  40 Units Subcutaneous QHS  . levothyroxine  75 mcg Oral QAC breakfast  . mouth rinse  15 mL Mouth Rinse q12n4p  . metoprolol tartrate  12.5 mg Oral BID  . pantoprazole  40 mg Oral BID AC  . Racepinephrine HCl  0.5 mL Nebulization Once   Continuous Infusions: . piperacillin-tazobactam (ZOSYN)  IV Stopped (12/17/17 1135)     LOS: 27 days   Time spent: 25 minutes.  Vance Gather, MD [facility] Pager [phone]  If 7PM-7AM, please contact night-coverage [email or web address] Password [email or web address] 12/17/2017, 12:16 PM

## 2017-12-17 NOTE — Care Management Note (Signed)
Case Management Note  Patient Details  Name: [name] MRN: [id number] Date of Birth: [birth year]  Subjective/Objective:                    Action/Plan: Plan continues for [facility] when patient is on RA with her trach and the infection has cleared. CM following.  Expected Discharge Date:                  Expected Discharge Plan:  Psychiatric Hospital  In-House Referral:     Discharge planning Services  CM Consult  Post Acute Care Choice:    Choice offered to:     DME Arranged:    DME Agency:     HH Arranged:    [place] Agency:     Status of Service:  In process, will continue to follow  If discussed at Long Length of Stay Meetings, dates discussed:    Additional Comments:  Pollie Friar, RN 12/17/2017, 10:53 AM

## 2017-12-17 NOTE — H&P (View-Only) (Signed)
Patient ID: [name], female   DOB: May 31, 1976, 42 y.o.   MRN: [id number] Awake on rounds, sitting in chair, breathing comfortably.  She is able to phonate nicely with finger occlusion of the tracheostomy.  She is also able to move air reasonably well with no stridor or distress.  We discussed the plan for tomorrow is to go to the operating room and under anesthesia, perform direct laryngoscopy and evaluate the subglottic region.  We discussed that there is a small chance that we may be able to decannulate tomorrow.  She understands and agrees.

## 2017-12-17 NOTE — Progress Notes (Signed)
Physical Therapy Treatment Patient Details Name: [name] MRN: [id number] DOB: May 31, 1976 Today's Date: 12/17/2017    History of Present Illness Patient is a 42 y/o female who presents with drug OD and Sz. Intubated 3/13-3/15. resp failure with emergent trach 3/21 due to vocal cord dysfunction. CT of throat- suggests left paralaryngeal abscess. PMH includes ORIF RLE, depression, anxiety, morbid obesity, DM, Cushing's, Rt uni knee replacement, HTN, left ankle surgery, dyslipidemia, hypothyroidism.     PT Comments    Patient in better spirits today. Tolerated gait training without use of RW. Continues to require at least 1 UE support using IV pole and 2 standing rest breaks due to fatigue and DOE. Pt anxious about procedure tomorrow but happy her parents came to visit today. Discussed ways to manage her anxiety and to increase her activity level while in the hospital. Pt now on 30% Fi02 trach collar and continues to have secretions with mobility. Eager to get out of the hospital. Will follow.    Follow Up Recommendations  Supervision for mobility/OOB;Other (comment)(plan for inpatient psych)     Equipment Recommendations  None recommended by PT    Recommendations for Other Services       Precautions / Restrictions Precautions Precautions: Fall Precaution Comments: tachycardia; suicide precautions, trach Restrictions Weight Bearing Restrictions: No    Mobility  Bed Mobility               General bed mobility comments: Pt was OOB in the recliner chair.   Transfers Overall transfer level: Needs assistance Equipment used: None Transfers: Sit to/from Stand Sit to Stand: Min guard         General transfer comment: Min guard assist for safety during transitions, especially without AD today.   Ambulation/Gait Ambulation/Gait assistance: Min assist;Min guard Ambulation Distance (Feet): 150 Feet Assistive device: (IV pole) Gait Pattern/deviations: Step-through  pattern;Staggering left;Staggering right;Wide base of support Gait velocity: decreased   General Gait Details: SLow, unsteady gait holding onto IV pole for support. 2 standing rest breaks due to DOE, fatigue. Pt on 28% Fi02 4L/min 02.    Stairs            Wheelchair Mobility    Modified Rankin (Stroke Patients Only)       Balance Overall balance assessment: Needs assistance Sitting-balance support: Feet supported;No upper extremity supported Sitting balance-Leahy Scale: Good Sitting balance - Comments: Able to reach down and adjust socks, no LOB.   Standing balance support: During functional activity Standing balance-Leahy Scale: Fair                              Cognition Arousal/Alertness: Awake/alert Behavior During Therapy: WFL for tasks assessed/performed Overall Cognitive Status: Within Functional Limits for tasks assessed                                        Exercises      General Comments General comments (skin integrity, edema, etc.): Pt opening up a lot this session about anxiety about the procedure tomorrow and happy her parents came to visit her today.      Pertinent Vitals/Pain Pain Assessment: No/denies pain Faces Pain Scale: No hurt    Home Living                      Prior Function  PT Goals (current goals can now be found in the care plan section) Acute Rehab PT Goals Patient Stated Goal: to forgive herself Progress towards PT goals: Progressing toward goals    Frequency    Min 3X/week      PT Plan Current plan remains appropriate    Co-evaluation              AM-PAC PT "6 Clicks" Daily Activity  Outcome Measure  Difficulty turning over in bed (including adjusting bedclothes, sheets and blankets)?: None Difficulty moving from lying on back to sitting on the side of the bed? : None Difficulty sitting down on and standing up from a chair with arms (e.g., wheelchair,  bedside commode, etc,.)?: None Help needed moving to and from a bed to chair (including a wheelchair)?: None Help needed walking in hospital room?: A Little Help needed climbing 3-5 steps with a railing? : A Little 6 Click Score: 22    End of Session Equipment Utilized During Treatment: Oxygen Activity Tolerance: Patient limited by fatigue Patient left: in chair;with call bell/phone within reach;with nursing/sitter in room Nurse Communication: Mobility status PT Visit Diagnosis: Muscle weakness (generalized) (M62.81);Unsteadiness on feet (R26.81);Difficulty in walking, not elsewhere classified (R26.2)     Time: 4580-9983 PT Time Calculation (min) (ACUTE ONLY): 34 min  Charges:  $Gait Training: 8-22 mins $Therapeutic Exercise: 8-22 mins                    G Codes:       Wray Kearns, PT, DPT [phone]     Marguarite Arbour A Shawan Corella 12/17/2017, 4:05 PM

## 2017-12-18 ENCOUNTER — Encounter (HOSPITAL_COMMUNITY)
Admission: EM | Disposition: A | Discharge status: Other Acute Inpatient Hospital | Payer: Self-pay | Source: Home / Self Care | Attending: Internal Medicine

## 2017-12-18 ENCOUNTER — Encounter (HOSPITAL_COMMUNITY): Payer: Self-pay | Admitting: Surgery

## 2017-12-18 ENCOUNTER — Inpatient Hospital Stay (HOSPITAL_COMMUNITY): Payer: Medicaid Other | Admitting: Anesthesiology

## 2017-12-18 HISTORY — PX: DIRECT LARYNGOSCOPY: SHX5326

## 2017-12-18 LAB — CBC
HCT: 31.2 % — ABNORMAL LOW (ref 36.0–46.0)
Hemoglobin: 9.6 g/dL — ABNORMAL LOW (ref 12.0–15.0)
MCH: 28.4 pg (ref 26.0–34.0)
MCHC: 30.8 g/dL (ref 30.0–36.0)
MCV: 92.3 fL (ref 78.0–100.0)
PLATELETS: 314 10*3/uL (ref 150–400)
RBC: 3.38 MIL/uL — AB (ref 3.87–5.11)
RDW: 13.6 % (ref 11.5–15.5)
WBC: 4.7 10*3/uL (ref 4.0–10.5)

## 2017-12-18 LAB — GLUCOSE, CAPILLARY
Glucose-Capillary: 180 mg/dL — ABNORMAL HIGH (ref 65–99)
Glucose-Capillary: 162 mg/dL — ABNORMAL HIGH (ref 65–99)
Glucose-Capillary: 166 mg/dL — ABNORMAL HIGH (ref 65–99)
Glucose-Capillary: 186 mg/dL — ABNORMAL HIGH (ref 65–99)

## 2017-12-18 LAB — BASIC METABOLIC PANEL
Anion gap: 10 (ref 5–15)
BUN: 11 mg/dL (ref 6–20)
CALCIUM: 9 mg/dL (ref 8.9–10.3)
CO2: 29 mmol/L (ref 22–32)
CREATININE: 0.88 mg/dL (ref 0.44–1.00)
Chloride: 101 mmol/L (ref 101–111)
Glucose, Bld: 180 mg/dL — ABNORMAL HIGH (ref 65–99)
Potassium: 4.4 mmol/L (ref 3.5–5.1)
SODIUM: 140 mmol/L (ref 135–145)

## 2017-12-18 SURGERY — LARYNGOSCOPY, DIRECT
Anesthesia: General | Site: Throat

## 2017-12-18 MED ORDER — FENTANYL CITRATE (PF) 100 MCG/2ML IJ SOLN
INTRAMUSCULAR | Status: DC | PRN
  Administered 2017-12-18: 50 ug via INTRAVENOUS

## 2017-12-18 MED ORDER — SUCCINYLCHOLINE CHLORIDE 20 MG/ML IJ SOLN
INTRAMUSCULAR | Status: DC | PRN
  Administered 2017-12-18: 120 mg via INTRAVENOUS

## 2017-12-18 MED ORDER — LACTATED RINGERS IV SOLN
INTRAVENOUS | Status: DC | PRN
  Administered 2017-12-18: 13:00:00 via INTRAVENOUS

## 2017-12-18 MED ORDER — PROPOFOL 10 MG/ML IV BOLUS
INTRAVENOUS | Status: DC | PRN
  Administered 2017-12-18: 100 mg via INTRAVENOUS

## 2017-12-18 MED ORDER — ALBUTEROL SULFATE HFA 108 (90 BASE) MCG/ACT IN AERS
INHALATION_SPRAY | RESPIRATORY_TRACT | Status: AC
  Filled 2017-12-18: qty 6.7

## 2017-12-18 MED ORDER — MIDAZOLAM HCL 2 MG/2ML IJ SOLN
INTRAMUSCULAR | Status: AC
  Filled 2017-12-18: qty 2

## 2017-12-18 MED ORDER — PROPOFOL 10 MG/ML IV BOLUS
INTRAVENOUS | Status: AC
  Filled 2017-12-18: qty 20

## 2017-12-18 MED ORDER — MEPERIDINE HCL 50 MG/ML IJ SOLN: 6.2500 mg | INTRAMUSCULAR | Status: DC | PRN

## 2017-12-18 MED ORDER — FENTANYL CITRATE (PF) 250 MCG/5ML IJ SOLN
INTRAMUSCULAR | Status: AC
  Filled 2017-12-18: qty 5

## 2017-12-18 MED ORDER — ONDANSETRON HCL 4 MG/2ML IJ SOLN: 4.0000 mg | Freq: Once | INTRAMUSCULAR | Status: DC | PRN

## 2017-12-18 MED ORDER — AMOXICILLIN-POT CLAVULANATE 875-125 MG PO TABS
1.0000 | ORAL_TABLET | Freq: Two times a day (BID) | ORAL | Status: AC
  Administered 2017-12-19 – 2017-12-22 (×8): 1 via ORAL
  Filled 2017-12-18 (×9): qty 1

## 2017-12-18 MED ORDER — HYDROMORPHONE HCL 1 MG/ML IJ SOLN: 0.2500 mg | INTRAMUSCULAR | Status: DC | PRN

## 2017-12-18 SURGICAL SUPPLY — 16 items
CANISTER SUCT 3000ML PPV (MISCELLANEOUS) ×2 IMPLANT
COVER BACK TABLE 60X90IN (DRAPES) ×2 IMPLANT
COVER MAYO STAND STRL (DRAPES) ×2 IMPLANT
DRAPE HALF SHEET 40X57 (DRAPES) ×2 IMPLANT
GAUZE SPONGE 4X4 16PLY XRAY LF (GAUZE/BANDAGES/DRESSINGS) IMPLANT
GLOVE ECLIPSE 7.5 STRL STRAW (GLOVE) ×2 IMPLANT
GUARD TEETH (MISCELLANEOUS) IMPLANT
KIT BASIN OR (CUSTOM PROCEDURE TRAY) ×2 IMPLANT
KIT TURNOVER KIT B (KITS) ×2 IMPLANT
NS IRRIG 1000ML POUR BTL (IV SOLUTION) ×2 IMPLANT
PAD ARMBOARD 7.5X6 YLW CONV (MISCELLANEOUS) ×4 IMPLANT
PATTIES SURGICAL .5 X3 (DISPOSABLE) IMPLANT
SOLUTION ANTI FOG 6CC (MISCELLANEOUS) IMPLANT
TOWEL OR 17X26 10 PK STRL BLUE (TOWEL DISPOSABLE) ×2 IMPLANT
TUBE CONNECTING 12X1/4 (SUCTIONS) ×2 IMPLANT
TUBE TRACH MID RANGE 6MM (TUBING) ×1 IMPLANT

## 2017-12-18 NOTE — Progress Notes (Signed)
Nutrition Follow-up  DOCUMENTATION CODES:   Morbid obesity  INTERVENTION:  Continue Pro-stat 78mL BID, each supplement provides 100 calories and 15 grams of protein  Continue Milk on every tray.  NUTRITION DIAGNOSIS:   Increased nutrient needs related to acute illness as evidenced by estimated needs. -ongoing  GOAL:   Patient will meet greater than or equal to 90% of their needs -progressing  MONITOR:   PO intake, I & O's, Weight trends, Labs, Supplement acceptance  ASSESSMENT:   42 yo female admitted with acute toxic encephalopathy secondary to intentional OD on baclofan, trazadone and  tramadol, seizures. Pt intubate for airway protection. Pt with hx of Cushing's, DM, OSA  Returned to OR today for repeat laryngoscopy. She was eating 100% prior to being made NPO. Has been consuming pro-stat as well. Lurline Idol was downsized by ENT today.  Labs reviewed:  CBGs 162, 180 Medications reviewed and include:  Insulin  Diet Order:  Diet NPO time specified Diet NPO time specified  EDUCATION NEEDS:   Not appropriate for education at this time  Skin:  Skin Assessment: Skin Integrity Issues: Skin Integrity Issues:: Other (Comment) Other: MASD: abdomen, bilateral groin, under breasts  Last BM:  12/11/2017  Height:   Ht Readings from Last 1 Encounters:  12/08/17 5\' 2"  (1.575 m)    Weight:   Wt Readings from Last 1 Encounters:  12/17/17 (!) 327 lb 13.2 oz (148.7 kg)    Ideal Body Weight:  52.3 kg  BMI:  Body mass index is 59.96 kg/m.  Estimated Nutritional Needs:   Kcal:  1832-2200 calories (ABW x25-30)  Protein:  115-131 grams  Fluid:  1.8-2.2L  [name]. Per [name], [name], [name] Inpatient Clinical Dietitian Pager [phone]

## 2017-12-18 NOTE — Anesthesia Postprocedure Evaluation (Signed)
Anesthesia Post Note  Patient: [name]  Procedure(s) Performed: DIRECT LARYNGOSCOPY (N/A Throat)     Patient location during evaluation: PACU Anesthesia Type: General Level of consciousness: awake and alert Pain management: pain level controlled Vital Signs Assessment: post-procedure vital signs reviewed and stable Respiratory status: spontaneous breathing, nonlabored ventilation, respiratory function stable and patient connected to tracheostomy mask oxygen Cardiovascular status: blood pressure returned to baseline and stable Postop Assessment: no apparent nausea or vomiting Anesthetic complications: no    Last Vitals:  Vitals:   12/18/17 1529 12/18/17 1538  BP: (!) 98/47   Pulse: 99 98  Resp: 19 15  Temp: 37.2 C   SpO2: 95% 93%    Last Pain:  Vitals:   12/18/17 1529  TempSrc: Oral  PainSc:                  Rorie Delmore S

## 2017-12-18 NOTE — Progress Notes (Signed)
Patient had trach changed today. Dr. Constance Holster placed moist 4 x 4 gauze in tracheostomy site. The guaze are packed into tracheostomy site below trach. RT will not remove at this time due to patient's pain level at this time. RN and RT assessed site. Patient is in pain and has high anxiety about trach. Airway is patent and patient's vitals are stable. Breath sounds are clear. RT deflated cuff so patient can communicate better. RN is aware. RT will continue to monitor as needed.

## 2017-12-18 NOTE — Interval H&P Note (Signed)
History and Physical Interval Note:  12/18/2017 12:32 PM  [name]  has presented today for surgery, with the diagnosis of AIRWAY BLOCKAGE  The various methods of treatment have been discussed with the patient and family. After consideration of risks, benefits and other options for treatment, the patient has consented to  Procedure(s): DIRECT LARYNGOSCOPY (N/A) as a surgical intervention .  The patient's history has been reviewed, patient examined, no change in status, stable for surgery.  I have reviewed the patient's chart and labs.  Questions were answered to the patient's satisfaction.     [name]

## 2017-12-18 NOTE — Progress Notes (Signed)
Called by RN saying that patients trach had come out. Upon arrival patients trach was in but trach ties were loose. Lurline Idol ties were tighten for patient and pt stated it was comfortable. Pt then complained that she could not talk. CO2 detector was placed on patient and had positive color. BBS were clear and diminished in the bases. Suctioned patient and received smakk pink tinged secretions. Pt tolerated well, SPO2 remian 100% the entire time. Patient is very anxious.

## 2017-12-18 NOTE — Anesthesia Preprocedure Evaluation (Signed)
Anesthesia Evaluation  Patient identified by MRN, date of birth, ID band Patient awake    Reviewed: Allergy & Precautions, NPO status , Patient's Chart, lab work & pertinent test results  Airway Mallampati: Trach       Dental   Pulmonary Current Smoker,    Pulmonary exam normal        Cardiovascular hypertension, Pt. on medications Normal cardiovascular exam     Neuro/Psych Seizures -,  Anxiety Depression    GI/Hepatic   Endo/Other  diabetes, Type 2, Insulin DependentMorbid obesity  Renal/GU      Musculoskeletal   Abdominal   Peds  Hematology   Anesthesia Other Findings   Reproductive/Obstetrics                             Anesthesia Physical Anesthesia Plan  ASA: III  Anesthesia Plan: General   Post-op Pain Management:    Induction: Intravenous  PONV Risk Score and Plan: 2 and Ondansetron and Treatment may vary due to age or medical condition  Airway Management Planned: Tracheostomy  Additional Equipment:   Intra-op Plan:   Post-operative Plan: Possible Post-op intubation/ventilation  Informed Consent: I have reviewed the patients History and Physical, chart, labs and discussed the procedure including the risks, benefits and alternatives for the proposed anesthesia with the patient or authorized representative who has indicated his/her understanding and acceptance.     Plan Discussed with: CRNA and Surgeon  Anesthesia Plan Comments:         Anesthesia Quick Evaluation

## 2017-12-18 NOTE — Progress Notes (Addendum)
Pt transported to short stay per staff, sitter accompanying  Pt returned from PACU @ 1500

## 2017-12-18 NOTE — Op Note (Addendum)
OPERATIVE REPORT  DATE OF SURGERY: 12/18/2017  PATIENT:  [name],  42 y.o. female  PRE-OPERATIVE DIAGNOSIS:  AIRWAY BLOCKAGE  POST-OPERATIVE DIAGNOSIS:  AIRWAY BLOCKAGE  PROCEDURE:  Procedure(s): DIRECT LARYNGOSCOPY  SURGEON:  Beckie Salts, MD  ASSISTANTS: None  ANESTHESIA:   General   EBL: 5 ml  DRAINS: None  LOCAL MEDICATIONS USED:  None  SPECIMEN:  none  COUNTS:  Correct  PROCEDURE DETAILS: The patient was taken to the operating room and placed on the operating table in the supine position. Following induction of general endotracheal anesthesia, the table was turned 90 degrees.  A maxillary tooth protector was used throughout the case.  A Jako laryngoscope was entered into the oral cavity used to inspect the larynx.  The cords were swollen, the arytenoids were edematous as well.  The subglottic larynx revealed irregular granular swollen tissue with some fibrinous exudate, not as necrotic appearing weeks ago but still with significant obstruction.  I was able to downsize the tracheostomy to a 6 mm cuffed adjustable length trach tube.  The previous tracheostomy tube was partially occluded so that was removed in order to facilitate ventilation during the case.  Patient was then awakened and transferred to recovery in stable condition.    PATIENT DISPOSITION:  To PACU, stable    Addendum: Of note, the wound at the inferior aspect of the tracheostomy site was granulating and did not appear to be infected at all.  I placed a moist and 4 x 4 in this wound and kept most of it hanging out for safety reasons.  The tracheostomy tube can be deflated as soon as she is awake enough.  She may resume her diet.  She is stable for transfer to whichever facility is going to be appropriate.  She will likely require the tracheostomy for the foreseeable future, as she is going to be very slow to heal this issue.

## 2017-12-18 NOTE — Progress Notes (Signed)
Pt is very emotional this morning. Stated she was tired and wanted to see her son and [name]. She said she feels like she is just here. She wants to go home. Had a supportive talk about doing better for herself. She know she made a mistake but doesn't want to go through the consequences. After talking with her, Pt feels better. Told her to find her happy place and stay positive. RN played her favorite song for her "Krystal Clark".

## 2017-12-18 NOTE — Transfer of Care (Signed)
Immediate Anesthesia Transfer of Care Note  Patient: [name]  Procedure(s) Performed: DIRECT LARYNGOSCOPY (N/A Throat)  Patient Location: PACU  Anesthesia Type:General  Level of Consciousness: awake, alert , oriented and sedated  Airway & Oxygen Therapy: Patient Spontanous Breathing and Patient connected to tracheostomy mask oxygen  Post-op Assessment: Report given to RN, Post -op Vital signs reviewed and stable and Patient moving all extremities  Post vital signs: Reviewed and stable  Last Vitals:  Vitals Value Taken Time  BP 115/44 12/18/2017  2:21 PM  Temp    Pulse 98 12/18/2017  2:35 PM  Resp 26 12/18/2017  2:35 PM  SpO2 100 % 12/18/2017  2:35 PM  Vitals shown include unvalidated device data.  Last Pain:  Vitals:   12/18/17 1426  TempSrc:   PainSc: 0-No pain      Patients Stated Pain Goal: 0 (58/83/25 4982)  Complications: No apparent anesthesia complications

## 2017-12-18 NOTE — Progress Notes (Signed)
Called Respiratory to assess gauze around trach they have assessed and we will not be changing gauze around trach tonight and have day shift get with Md to determine care would be a bleed risk and patient I having pain issue regarding trach in general.

## 2017-12-18 NOTE — Progress Notes (Signed)
PROGRESS NOTE    [name]  DEY:[id number] DOB: 02/03/1976 DOA: 11/20/2017 PCP: Sandi Mariscal, MD      Brief Narrative:  [name] is a 42 y.o. female with a history of morbid obesity (BMI 45), IDDM, OSA, HTN, Cushing's disease and depression who presented 3/13 after suicide attempt by intentional overdose of baclofen, trazodone, and tramadol. She reportedly was encephalopathic and experienced a seizure, intubated for airway protection and admitted to the ICU. After extubation she developed stridor initially responsive to racemic epinephrine with laryngoscopy revealing fixed vocal cords, though when this recurred 3/21 it was unresponsive necessitating tracheostomy with laryngoscopy revealing circumferential subglottic swelling with necrotic debris and fibrinous exudate. Respiratory status has stabilized, though she continued to have significant secretions with odor. Clindamycin was started 3/27 and ENT reevaluated the patient 4/2 due to CT showing left paralaryngeal abscess. Flexible laryngoscopy was repeated showing pooled secretions in pharynx, and inability to assess vocal cord mobility or subglottic patency due to poor laryngeal airway. Psychiatry has continued to recommend transfer to inpatient psychiatry for suicidal ideation and attempt when medically stable.     Assessment & Plan:  Acute hypoxic respiratory failure due to trazodone, baclofen, tramadol overdose Tracheostomy wound infection Subglottic stenosis To laryngoscopy today.  May transition to oral antibiotics or continue IV pending ENT results.   -Consult ENT, appreciate cares -Continue Zosyn for now   Intentional overdose Suicidal ideation Depression -Continue bupropion  Acute kidney injury  Type 2 diabetes, with hyperglycemia, dependent Glucose is good -Continue Lantus -Continue SSI  Leg swelling Combination venous insufficiency.  Had some presumed pulmonary edema earlier in hospitalization, no congestive heart  failure. -TED hose  Seizure disorder -Continue gabapentin  Hypothyroidism -Continue levothyroxine  Morbid obesity -Continue pro-stat supplement  Cushing's disease  Hypertension -Continue metoprolol  Normocytic anemia Etiology from acute illness.  Stable.  Hyperkalemia Resolved  Other medications -Continue pantoprazole         DVT prophylaxis: Lovenox Code Status: FULL Family Communication: None present MDM and disposition Plan: The below labs and imaging reports were reviewed.  The patient's status is clinically improving.     Consultants:   ENT  Procedures:   laryngoscopy  Antimicrobials:   Clindamycin  Zosyn    Subjective: Feels better.  Able to talk with PMV.  No new fever, confusion, discharge from wound.  No dyspnea.  Leg swelling better.  Objective: Vitals:   12/18/17 0254 12/18/17 0327 12/18/17 0719 12/18/17 0729  BP:  107/64  (!) 101/55  Pulse: 76 78 96 87  Resp: 19 20 18  (!) 21  Temp:  98.7 F (37.1 C)  98 F (36.7 C)  TempSrc:  Oral  Oral  SpO2: 96% 96% 94% 97%  Weight:      Height:        Intake/Output Summary (Last 24 hours) at 12/18/2017 0854 Last data filed at 12/17/2017 2219 Gross per 24 hour  Intake 480 ml  Output -  Net 480 ml   Filed Weights   12/15/17 0451 12/16/17 0500 12/17/17 0631  Weight: (!) 144.5 kg (318 lb 9 oz) (!) 148.5 kg (327 lb 6.1 oz) (!) 148.7 kg (327 lb 13.2 oz)    Examination: General appearance: Obese adult female, alert and in no acute distress.    L:ying in bed, trach in place, interactive HEENT: Anicteric, conjunctiva pink, lids and lashes normal. No nasal deformity, discharge, epistaxis.  Lips moist.   Trach in place, blowby cuff in place. Skin: Warm and dry.  no jaundice.  No suspicious rashes or lesions. Cardiac: RRR, nl S1-S2, no murmurs appreciated.  Capillary refill is brisk.  JVP not visible.  No LE edema.  Radial pulses 2+ and symmetric. Respiratory: Normal respiratory rate and rhythm.   CTAB without rales or wheezes. Abdomen: Abdomen soft.  No TTP. No ascites, distension, hepatosplenomegaly.   MSK: No deformities or effusions. Neuro: Awake and alert.  EOMI, moves all extremities. Speech fluent.    Psych: Sensorium intact and responding to questions, attention normal. Affect normral.  Judgment and insight appear normal.    Data Reviewed: I have personally reviewed following labs and imaging studies:  CBC: Recent Labs  Lab 12/13/17 0605 12/18/17 0532  WBC 8.3 4.7  HGB 10.8* 9.6*  HCT 34.2* 31.2*  MCV 91.0 92.3  PLT 413* 621   Basic Metabolic Panel: Recent Labs  Lab 12/13/17 0605 12/13/17 1517 12/14/17 0552 12/15/17 0430 12/18/17 0532  NA 136  --  136 137 140  K 5.8* 5.8* 5.2* 4.7 4.4  CL 102  --  101 99* 101  CO2 26  --  24 28 29   GLUCOSE 205*  --  178* 174* 180*  BUN 15  --  15 16 11   CREATININE 0.85  --  0.97 1.01* 0.88  CALCIUM 9.1  --  8.9 8.8* 9.0   GFR: Estimated Creatinine Clearance: 116.5 mL/min (by C-G formula based on SCr of 0.88 mg/dL). Liver Function Tests: No results for input(s): AST, ALT, ALKPHOS, BILITOT, PROT, ALBUMIN in the last 168 hours. No results for input(s): LIPASE, AMYLASE in the last 168 hours. No results for input(s): AMMONIA in the last 168 hours. Coagulation Profile: No results for input(s): INR, PROTIME in the last 168 hours. Cardiac Enzymes: No results for input(s): CKTOTAL, CKMB, CKMBINDEX, TROPONINI in the last 168 hours. BNP (last 3 results) No results for input(s): PROBNP in the last 8760 hours. HbA1C: No results for input(s): HGBA1C in the last 72 hours. CBG: Recent Labs  Lab 12/17/17 1128 12/17/17 1640 12/17/17 1838 12/17/17 2105 12/18/17 0631  GLUCAP 220* 106* 178* 154* 180*   Lipid Profile: No results for input(s): CHOL, HDL, LDLCALC, TRIG, CHOLHDL, LDLDIRECT in the last 72 hours. Thyroid Function Tests: No results for input(s): TSH, T4TOTAL, FREET4, T3FREE, THYROIDAB in the last 72 hours. Anemia  Panel: No results for input(s): VITAMINB12, FOLATE, FERRITIN, TIBC, IRON, RETICCTPCT in the last 72 hours. Urine analysis:    Component Value Date/Time   COLORURINE RED (A) 10/31/2016 2130   APPEARANCEUR CLOUDY (A) 10/31/2016 2130   LABSPEC 1.027 10/31/2016 2130   PHURINE 6.0 10/31/2016 2130   GLUCOSEU >=500 (A) 10/31/2016 2130   HGBUR LARGE (A) 10/31/2016 2130   BILIRUBINUR NEGATIVE 10/31/2016 2130   [place] NEGATIVE 10/31/2016 2130   PROTEINUR 100 (A) 10/31/2016 2130   UROBILINOGEN 0.2 04/09/2014 1157   NITRITE NEGATIVE 10/31/2016 2130   LEUKOCYTESUR NEGATIVE 10/31/2016 2130   Sepsis Labs: @LABRCNTIP (procalcitonin:4,lacticacidven:4)  )No results found for this or any previous visit (from the past 240 hour(s)).       Radiology Studies: No results found.      Scheduled Meds: . buPROPion  100 mg Oral BID  . chlorhexidine  15 mL Mouth Rinse BID  . cholestyramine  4 g Oral QODAY  . enoxaparin (LOVENOX) injection  70 mg Subcutaneous Q24H  . feeding supplement (PRO-STAT SUGAR FREE 64)  30 mL Oral BID  . gabapentin  600 mg Oral BID  . Gerhardt's butt cream   Topical Daily  . insulin  aspart  0-20 Units Subcutaneous TID WC  . insulin aspart  8 Units Subcutaneous TID WC  . insulin glargine  40 Units Subcutaneous QHS  . levothyroxine  75 mcg Oral QAC breakfast  . mouth rinse  15 mL Mouth Rinse q12n4p  . metoprolol tartrate  12.5 mg Oral BID  . pantoprazole  40 mg Oral BID AC  . Racepinephrine HCl  0.5 mL Nebulization Once   Continuous Infusions: . piperacillin-tazobactam (ZOSYN)  IV 3.375 g (12/18/17 0655)     LOS: 28 days    Time spent: 30 minutes    Edwin Dada, MD [facility] 12/18/2017, 8:54 AM     Pager [phone] --- please page though AMION:  [email or web address] If 7PM-7AM, please contact night-coverage

## 2017-12-19 ENCOUNTER — Encounter (HOSPITAL_COMMUNITY): Payer: Self-pay | Admitting: Otolaryngology

## 2017-12-19 DIAGNOSIS — F32A Depression, unspecified: Secondary | ICD-10-CM

## 2017-12-19 DIAGNOSIS — F329 Major depressive disorder, single episode, unspecified: Secondary | ICD-10-CM

## 2017-12-19 LAB — GLUCOSE, CAPILLARY
Glucose-Capillary: 148 mg/dL — ABNORMAL HIGH (ref 65–99)
Glucose-Capillary: 229 mg/dL — ABNORMAL HIGH (ref 65–99)
Glucose-Capillary: 185 mg/dL — ABNORMAL HIGH (ref 65–99)
Glucose-Capillary: 151 mg/dL — ABNORMAL HIGH (ref 65–99)

## 2017-12-19 NOTE — Progress Notes (Addendum)
Physical Therapy Treatment Patient Details Name: [name] MRN: [id number] DOB: [birth year] Today's Date: 12/19/2017    History of Present Illness Patient is a 42 y/o female who presents with drug OD and Sz. Intubated 3/13-3/15. resp failure with emergent trach 3/21 due to vocal cord dysfunction. CT of throat- suggests left paralaryngeal abscess. s/p Laryngoscopy 4/10. PMH includes ORIF RLE, depression, anxiety, morbid obesity, DM, Cushing's, Rt uni knee replacement, HTN, left ankle surgery, dyslipidemia, hypothyroidism.     PT Comments    Patient s/p laryngoscopy yesterday and feeling anxious about everything. Tolerated gait training with close Min guard for safety requiring 1 standing rest break due to 2-3/4 DOE. Pt on 35% Fi02 6L/min 02 and Sp02 remained >93%. Continues to have tachycardia with HR up to 125 bpm. Encouraged short bouts of activity/ambulation with sitter. Goals updated. Will continue to follow.   Follow Up Recommendations  Supervision for mobility/OOB;Other (comment)(plan for inpatient psych)     Equipment Recommendations  None recommended by PT    Recommendations for Other Services       Precautions / Restrictions Precautions Precautions: Fall Precaution Comments: tachycardia; suicide precautions, trach Restrictions Weight Bearing Restrictions: No    Mobility  Bed Mobility               General bed mobility comments: Pt was OOB in the recliner chair.   Transfers Overall transfer level: Needs assistance Equipment used: None Transfers: Sit to/from Stand Sit to Stand: Min guard         General transfer comment: Min guard assist for safety during transitions, especially without AD today. 1 almost LOB initially but able to self correct.   Ambulation/Gait Ambulation/Gait assistance: Min guard Ambulation Distance (Feet): 150 Feet Assistive device: None Gait Pattern/deviations: Step-through pattern;Wide base of support;Drifts right/left Gait  velocity: decreased   General Gait Details: Slow, mildly unsteady waddling like gait, reaching for rail as needed; 35% Fi02 6L/min 02. HR up to 125 bpm. 1 standing rest break. 3/4 DOE. Sp02 remained >93%.   Stairs            Wheelchair Mobility    Modified Rankin (Stroke Patients Only)       Balance Overall balance assessment: Needs assistance Sitting-balance support: Feet supported;No upper extremity supported Sitting balance-Leahy Scale: Good Sitting balance - Comments: Able to reach down and adjust socks, no LOB but needed help donning socks.   Standing balance support: During functional activity Standing balance-Leahy Scale: Fair                              Cognition Arousal/Alertness: Awake/alert Behavior During Therapy: WFL for tasks assessed/performed Overall Cognitive Status: Within Functional Limits for tasks assessed                                 General Comments: Writes on white board occasionally for communication or can talk with raspy voice as well.      Exercises      General Comments General comments (skin integrity, edema, etc.): Mom present during session.      Pertinent Vitals/Pain Pain Assessment: Faces Faces Pain Scale: Hurts a little bit Pain Location: trach site Pain Descriptors / Indicators: Discomfort;Grimacing Pain Intervention(s): Monitored during session;Repositioned    Home Living  Prior Function            PT Goals (current goals can now be found in the care plan section) Acute Rehab PT Goals Patient Stated Goal: to forgive herself PT Goal Formulation: With patient Time For Goal Achievement: 01/02/18 Potential to Achieve Goals: Good Progress towards PT goals: Progressing toward goals    Frequency    Min 3X/week      PT Plan Current plan remains appropriate    Co-evaluation              AM-PAC PT "6 Clicks" Daily Activity  Outcome Measure   Difficulty turning over in bed (including adjusting bedclothes, sheets and blankets)?: None Difficulty moving from lying on back to sitting on the side of the bed? : None Difficulty sitting down on and standing up from a chair with arms (e.g., wheelchair, bedside commode, etc,.)?: None Help needed moving to and from a bed to chair (including a wheelchair)?: None Help needed walking in hospital room?: A Little Help needed climbing 3-5 steps with a railing? : A Little 6 Click Score: 22    End of Session Equipment Utilized During Treatment: Oxygen Activity Tolerance: Patient tolerated treatment well;Patient limited by fatigue Patient left: in chair;with call bell/phone within reach;with nursing/sitter in room;with family/visitor present Nurse Communication: Mobility status PT Visit Diagnosis: Muscle weakness (generalized) (M62.81);Unsteadiness on feet (R26.81);Difficulty in walking, not elsewhere classified (R26.2)     Time: 1017-1040 PT Time Calculation (min) (ACUTE ONLY): 23 min  Charges:  $Gait Training: 8-22 mins $Therapeutic Exercise: 8-22 mins                    G Codes:       Wray Kearns, PT, DPT [phone]     Marguarite Arbour A Dotty Gonzalo 12/19/2017, 10:55 AM

## 2017-12-19 NOTE — Progress Notes (Signed)
Patient was decreased to room air with humidity via trach collar by RT. She remained between 95-97% on roomair unitl she ambulated in the hall. She DeSat to 88% but quickly rebound to 95% after sitting. Continued to monitor for patient safety.

## 2017-12-19 NOTE — Consult Note (Signed)
[facility] Psych Consult Progress Note  12/19/2017 11:55 AM [name]  MRN:  [id number] Subjective:   [name] was last seen on 4/5. She was admitting following a suicide attempt with Baclofen, Trazodone and Tramadol. She was recommended for inpatient psychiatric hospitalization.    On interview, [name] reports that she is doing better. She denies any thoughts to harm self sicne 3/22.  She denies HI or AVH.  She reports that she is ready to get back to "normalcy."  She is a stay at home mother.  She has a 33 and 21 y/o. Her son will be starting his first job. She reports feeling safe to discharge home.  She denies problems with appetite or sleep.  She denies somatic complaints today.  Spoke with patient's husband, [name] by phone with her permission. He does not feel that she is safe for discharge home and there is no viable safety plan. He worries that she will harm herself due to low frustration tolerance. She becomes easily upset and needs healthier ways to cope with stressors. He is unable to monitor her for safety and reports that he is at risk of losing his job since he has already missed several days to visit his wife in the hospital.   Principal Problem: MDD (major depressive disorder), recurrent episode, moderate ([place]) Diagnosis:   Patient Active Problem List   Diagnosis Date Noted  . Acute respiratory failure with hypoxemia ([place]) [J96.01]   . SOB (shortness of breath) [R06.02]   . 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 Hoarse voice quality [R49.0]   . Hypomagnesemia [E83.42]   . Shortness of breath [R06.02]   . Severe episode of recurrent major depressive disorder, without psychotic features ([place]) [F33.2]   . Intentional overdose of drug in tablet form ([place]) [T50.902A] 11/20/2017  . Intentional drug overdose ([place]) [T50.902A]   . Seizure ([place]) [R56.9]   . Acute respiratory failure with hypercapnia ([place]) [J96.02]   . Status epilepticus ([place]) [G40.901]   . 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 Abdominal pain, epigastric [R10.13]   . LUQ abdominal pain [R10.12]   . Nausea and vomiting [R11.2]   . Gastroparesis [K31.84]   . Hypertriglyceridemia [E78.1] 07/22/2015  . Diabetes mellitus due to underlying condition, uncontrolled, with diabetic neuropathy, with long-term current use of insulin (HCC) [E08.40, E08.65, Z79.4]   . Vitamin D deficiency [E55.9] 07/19/2015  . 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 Osteoporosis [M81.0] 07/19/2015  . 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 Fracture, tibial plateau [S82.143A] 07/16/2015  . Uncontrolled diabetes mellitus with diabetic neuropathy, with long-term current use of insulin ([place]) [E11.40, Z79.4, E11.65] 07/16/2015  . Leukocytosis [D72.829] 07/16/2015  . Essential hypertension [I10] 07/16/2015  . Morbid obesity ([place]) [E66.01] 04/20/2014  . Hyperlipidemia [E78.5] 07/31/2012  . Cushing disease ([place]) [E24.0] 07/31/2012  . Hypothyroid [E03.9] 07/31/2012  . Major depressive disorder, recurrent episode, mild ([place]) [F33.0] 04/08/2012   Total Time spent with patient: 15 minutes  Past Psychiatric History: MDD  Past Medical History:  Past Medical History:  Diagnosis Date  . Anxiety   . Complication of anesthesia    Pt. states takes long time to wake up from it.   . Cushing's disease ([place])   . Depression   . Diabetes ([place])   . Hyperlipidemia   . Hyperlipidemia   . Hypertension   . Morbid  obesity ([place])   . Osteoporosis 07/19/2015  . 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 Sleep apnea   . Uncontrolled diabetes mellitus with diabetic neuropathy, with long-term current use of insulin ([place]) 07/16/2015  . Vitamin D deficiency 07/19/2015    Past Surgical History:  Procedure Laterality Date   . ANTERIOR TALOFIBULAR LIGAMENT REPAIR Left 11/15/2014   Procedure: ANTERIOR TALOFIBULAR LIGAMENT REPAIR;  Surgeon: Jana Half, DPM;  Location: [place];  Service: Podiatry;  Laterality: Left;  . CESAREAN SECTION  dec 1997/  06-03-2001/   01-01-2005   BILATERAL TUBAL LIGATION WITH LAST ONE  . DILATION AND CURETTAGE OF UTERUS  1995   WITH SUCTION  . DIRECT LARYNGOSCOPY N/A 12/18/2017   Procedure: DIRECT LARYNGOSCOPY;  Surgeon: Izora Gala, MD;  Location: [place];  Service: ENT;  Laterality: N/A;  . ESOPHAGOGASTRODUODENOSCOPY (EGD) WITH PROPOFOL N/A 10/04/2016   Procedure: ESOPHAGOGASTRODUODENOSCOPY (EGD) WITH PROPOFOL;  Surgeon: [name], MD;  Location: [facility];  Service: Endoscopy;  Laterality: N/A;  . LAPAROSCOPIC CHOLECYSTECTOMY  09-25-2005  . ORIF TIBIA PLATEAU Right 07/19/2015   Procedure: OPEN REDUCTION INTERNAL FIXATION (ORIF) RIGHT TIBIAL PLATEAU;  Surgeon: Altamese [place], MD;  Location: [place];  Service: Orthopedics;  Laterality: Right;  . PARTIAL KNEE ARTHROPLASTY Right 04/19/2014   Procedure: RIGHT UNI KNEE ARTHROPLASTY MEDIALLY ;  Surgeon: Mauri Pole, MD;  Location: [facility];  Service: Orthopedics;  Laterality: Right;  . TRACHEOSTOMY TUBE PLACEMENT N/A 11/28/2017   Procedure: TRACHEOSTOMY;  Surgeon: Izora Gala, MD;  Location: [place];  Service: ENT;  Laterality: N/A;  . TUBAL LIGATION     Family History:  Family History  Adopted: Yes  Problem Relation Age of Onset  . Autism Son   . ADD / ADHD Son   . Apraxia Son    Family Psychiatric  History: Son-autism and ADHD.  Social History:  Social History   Substance and Sexual Activity  Alcohol Use Yes   Comment: RARE     Social History   Substance and Sexual Activity  Drug Use No  . Types: Marijuana    Social History   Socioeconomic History  . Marital status: Married    Spouse name: [name]  . Number of children: 2  . Years of education: Not on file  . Highest education level: Not on file   Occupational History  . Not on file  Social Needs  . Financial resource strain: Not on file  . Food insecurity:    Worry: Not on file    Inability: Not on file  . Transportation needs:    Medical: Not on file    Non-medical: Not on file  Tobacco Use  . Smoking status: Current Every Day Smoker    Years: 4.00    Types: Cigarettes  . Smokeless tobacco: Never Used  Substance and Sexual Activity  . Alcohol use: Yes    Comment: RARE  . Drug use: No    Types: Marijuana  . Sexual activity: Yes    Partners: Male    Birth control/protection: Pill  Lifestyle  . Physical activity:    Days per week: Patient refused    Minutes per session: Patient refused  . Stress: Not on file  Relationships  . Social connections:    Talks on phone: Not on file    Gets together: Not on file    Attends religious service: Not on file    Active member of club  or organization: Not on file    Attends meetings of clubs or organizations: Not on file    Relationship status: Not on file  Other Topics Concern  . Not on file  Social History Narrative  . Not on file    Sleep: Good  Appetite:  Good  Current Medications: Current Facility-Administered Medications  Medication Dose Route Frequency Provider Last Rate Last Dose  . acetaminophen (TYLENOL) tablet 650 mg  650 mg Oral Q6H PRN Arby Barrette A, NP   650 mg at 12/14/17 0852  . amoxicillin-clavulanate (AUGMENTIN) 875-125 MG per tablet 1 tablet  1 tablet Oral Q12H Danford, Suann Larry, MD   1 tablet at 12/19/17 1111  . buPROPion [facility]) tablet 100 mg  100 mg Oral BID Patrecia Pour, MD   100 mg at 12/19/17 1107  . chlorhexidine (PERIDEX) 0.12 % solution 15 mL  15 mL Mouth Rinse BID Cristal Ford, DO   15 mL at 12/18/17 2152  . cholestyramine (QUESTRAN) packet 4 g  4 g Oral Georgeanna Lea, MD   4 g at 12/19/17 1106  . enoxaparin (LOVENOX) injection 70 mg  70 mg Subcutaneous Q24H Dang, Thuy D, RPH   70 mg at 12/17/17 1414  .  feeding supplement (PRO-STAT SUGAR FREE 64) liquid 30 mL  30 mL Oral BID Patrecia Pour, MD   30 mL at 12/19/17 1106  . fentaNYL (SUBLIMAZE) injection 25 mcg  25 mcg Intravenous Q4H PRN Nita Sells, MD   25 mcg at 12/18/17 1546  . gabapentin (NEURONTIN) capsule 600 mg  600 mg Oral BID Patrecia Pour, MD   600 mg at 12/19/17 1108  . Gerhardt's butt cream   Topical Daily Vance Gather B, MD      . hydrALAZINE (APRESOLINE) injection 10 mg  10 mg Intravenous Q4H PRN Marijean Heath, NP   10 mg at 11/22/17 1358  . ibuprofen (ADVIL,MOTRIN) tablet 600 mg  600 mg Oral Q6H PRN Patrecia Pour, MD   600 mg at 12/17/17 0429  . insulin aspart (novoLOG) injection 0-20 Units  0-20 Units Subcutaneous TID WC Nita Sells, MD   7 Units at 12/19/17 1147  . insulin aspart (novoLOG) injection 8 Units  8 Units Subcutaneous TID WC Patrecia Pour, MD   8 Units at 12/19/17 1147  . insulin glargine (LANTUS) injection 40 Units  40 Units Subcutaneous QHS Patrecia Pour, MD   40 Units at 12/18/17 2154  . ipratropium-albuterol (DUONEB) 0.5-2.5 (3) MG/3ML nebulizer solution 3 mL  3 mL Nebulization Q4H PRN Nita Sells, MD   3 mL at 12/17/17 2057  . levothyroxine (SYNTHROID, LEVOTHROID) tablet 75 mcg  75 mcg Oral QAC breakfast Nita Sells, MD   75 mcg at 12/19/17 1107  . lidocaine (XYLOCAINE) 2 % jelly 1 application  1 application Topical Once PRN Nita Sells, MD      . lidocaine (XYLOCAINE) 4 % external solution 0-50 mL  0-50 mL Topical Once PRN [name], MD      . lidocaine-EPINEPHrine (XYLOCAINE-EPINEPHrine) 1 %-1:200000 (PF) injection 0-30 mL  0-30 mL Intradermal Once PRN [name], MD      . MEDLINE mouth rinse  15 mL Mouth Rinse q12n4p Mikhail, R[place], DO   15 mL at 12/17/17 1842  . menthol-cetylpyridinium (CEPACOL) lozenge 3 mg  1 lozenge Oral PRN Deterding, Guadelupe Sabin, MD   3 mg at 11/27/17 2336  . metoprolol tartrate (LOPRESSOR) tablet 12.5 mg  12.5 mg Oral BID  Nita Sells, MD   12.5 mg at 12/18/17 2154  . oxyCODONE-acetaminophen (PERCOCET/ROXICET) 5-325 MG per tablet 1-2 tablet  1-2 tablet Oral Q4H PRN Nita Sells, MD   2 tablet at 12/17/17 2243  . oxymetazoline (AFRIN) 0.05 % nasal spray 1 spray  1 spray Each Nare Once PRN [name], MD      . oxymetazoline (AFRIN) 0.05 % nasal spray 1 spray  1 spray Each Nare Once PRN [name], MD      . pantoprazole (PROTONIX) EC tablet 40 mg  40 mg Oral BID AC Nita Sells, MD   40 mg at 12/19/17 1108  . phenol (CHLORASEPTIC) mouth spray 1 spray  1 spray Mouth/Throat PRN Cristal Ford, DO   1 spray at 11/27/17 2145  . Racepinephrine HCl 2.25 % nebulizer solution 0.5 mL  0.5 mL Nebulization Once Cristal Ford, DO      . silver nitrate applicators applicator 1 Stick  1 Stick Topical Once PRN [name], MD      . sodium chloride flush (NS) 0.9 % injection 10-40 mL  10-40 mL Intracatheter PRN Patrecia Pour, MD   10 mL at 12/13/17 0005  . traZODone (DESYREL) tablet 50 mg  50 mg Oral QHS PRN Faythe Dingwall, DO   50 mg at 12/14/17 0229    Lab Results:  Results for orders placed or performed during the hospital encounter of 11/20/17 (from the past 48 hour(s))  Glucose, capillary     Status: Abnormal   Collection Time: 12/17/17  4:40 PM  Result Value Ref Range   Glucose-Capillary 106 (H) 65 - 99 mg/dL   Comment 1 Notify RN    Comment 2 Document in Chart   Glucose, capillary     Status: Abnormal   Collection Time: 12/17/17  6:38 PM  Result Value Ref Range   Glucose-Capillary 178 (H) 65 - 99 mg/dL  Glucose, capillary     Status: Abnormal   Collection Time: 12/17/17  9:05 PM  Result Value Ref Range   Glucose-Capillary 154 (H) 65 - 99 mg/dL  Basic metabolic panel     Status: Abnormal   Collection Time: 12/18/17  5:32 AM  Result Value Ref Range   Sodium 140 135 - 145 mmol/L   Potassium 4.4 3.5 - 5.1 mmol/L   Chloride 101 101 - 111 mmol/L   CO2 29 22 - 32 mmol/L    Glucose, Bld 180 (H) 65 - 99 mg/dL   BUN 11 6 - 20 mg/dL   Creatinine, Ser 0.88 0.44 - 1.00 mg/dL   Calcium 9.0 8.9 - 10.3 mg/dL   GFR calc non Af Amer >60 >60 mL/min   GFR calc Af Amer >60 >60 mL/min    Comment: (NOTE) The eGFR has been calculated using the CKD EPI equation. This calculation has not been validated in all clinical situations. eGFR's persistently <60 mL/min signify possible Chronic Kidney Disease.    Anion gap 10 5 - 15    Comment: Performed at [facility] [address], [place], [place] [address]  CBC     Status: Abnormal   Collection Time: 12/18/17  5:32 AM  Result Value Ref Range   WBC 4.7 4.0 - 10.5 K/uL   RBC 3.38 (L) 3.87 - 5.11 MIL/uL   Hemoglobin 9.6 (L) 12.0 - 15.0 g/dL   HCT 31.2 (L) 36.0 - 46.0 %   MCV 92.3 78.0 - 100.0 fL   MCH 28.4 26.0 - 34.0 pg  MCHC 30.8 30.0 - 36.0 g/dL   RDW 13.6 11.5 - 15.5 %   Platelets 314 150 - 400 K/uL    Comment: Performed at [facility], [place] [address], [place], [place] [address]  Glucose, capillary     Status: Abnormal   Collection Time: 12/18/17  6:31 AM  Result Value Ref Range   Glucose-Capillary 180 (H) 65 - 99 mg/dL   Comment 1 Notify RN    Comment 2 Document in Chart   Glucose, capillary     Status: Abnormal   Collection Time: 12/18/17 11:04 AM  Result Value Ref Range   Glucose-Capillary 162 (H) 65 - 99 mg/dL   Comment 1 Notify RN    Comment 2 Document in Chart   Glucose, capillary     Status: Abnormal   Collection Time: 12/18/17  5:42 PM  Result Value Ref Range   Glucose-Capillary 166 (H) 65 - 99 mg/dL  Glucose, capillary     Status: Abnormal   Collection Time: 12/18/17  9:22 PM  Result Value Ref Range   Glucose-Capillary 186 (H) 65 - 99 mg/dL   Comment 1 Notify RN    Comment 2 Document in Chart   Glucose, capillary     Status: Abnormal   Collection Time: 12/19/17  6:20 AM  Result Value Ref Range   Glucose-Capillary 148 (H) 65 - 99 mg/dL   Comment 1 Notify RN    Comment 2  Document in Chart   Glucose, capillary     Status: Abnormal   Collection Time: 12/19/17 11:34 AM  Result Value Ref Range   Glucose-Capillary 229 (H) 65 - 99 mg/dL    Blood Alcohol level:  Lab Results  Component Value Date   ETH <10 11/20/2017   Musculoskeletal: Strength & Muscle Tone: within normal limits Gait & Station: UTA since patient was sitting in a chair.  Patient leans: N/A  Psychiatric Specialty Exam: Physical Exam  Nursing note and vitals reviewed. Constitutional: She is oriented to person, place, and time. She appears well-developed and well-nourished.  HENT:  Head: Normocephalic and atraumatic.  Neck: Normal range of motion.  Respiratory: Effort normal.  Musculoskeletal: Normal range of motion.  Neurological: She is alert and oriented to person, place, and time.  Skin: No rash noted.  Psychiatric: Her behavior is normal. Thought content normal.    ROS  Blood pressure (!) 104/56, pulse 73, temperature 98.3 F (36.8 C), temperature source Oral, resp. rate 20, height 5' 2"  (1.575 m), weight (!) 145.7 kg (321 lb 3.4 oz), SpO2 97 %.Body mass index is 58.75 kg/m.  General Appearance: Fairly Groomed, morbidly obese, middle aged, Caucasian female, wearing a hospital gown with tracheostomy and sitting in a chair. NAD.   Eye Contact:  Good  Speech:  Her speech is hoarse and barely audible. She uses a dry erase board to communicate.   Volume:  Decreased  Mood:  "Better"  Affect:  Appropriate, Congruent and Full Range  Thought Process:  Goal Directed, Linear and Descriptions of Associations: Intact  Orientation:  Full (Time, Place, and Person)  Thought Content:  Logical  Suicidal Thoughts:  No  Homicidal Thoughts:  No  Memory:  Immediate;   Good Recent;   Good Remote;   Good  Judgement:  Fair  Insight:  Fair  Psychomotor Activity:  Normal  Concentration:  Concentration: Good and Attention Span: Good  Recall:  Good  Fund of Knowledge:  Good  Language:  Good   Akathisia:  No  Handed:  Right  AIMS (if indicated):   N/A  Assets:  Communication Skills Desire for Improvement Housing Intimacy Social Support  ADL's:  Intact  Cognition:  WNL  Sleep:   Okay   Assessment: Shalicia L Spirito is a 42 y.o. female who was admitted for suicide attempt by drug overdose in the setting of depression and family stressors. She was last seen on 4/5. She was asked to intermittently follow since she is not expected to be medically cleared soon due to complicated hospital course. Her mood has significantly improved. She continues to deny SI, HI or AVH. She continues to warrant inpatient psychiatric hospitalization due to high risk of harm to self given poor frustration tolerance in the setting of stressors and inability to have a viable safety plan. Her husband reports that he does not feel like she is safe to return home.    Treatment Plan Summary: -Patient continues to warrant inpatient psychiatric hospitalization given high risk of harm to self. -Continue Engineer, materials.  -Continue home medications: Wellbutrin 100 mg BID for depression, Trazodone 50 mg qhs PRN for insomnia and Gabapentin 300 mg BID for anxiety.  -Please pursue involuntary commitment if patient refuses voluntary psychiatric hospitalization or attempts to leave the hospital.  -Psychiatry will follow patient as needed.    Faythe Dingwall, DO 12/19/2017, 11:55 AM

## 2017-12-19 NOTE — Progress Notes (Addendum)
Patient refused Amoxicillin which see took earlier today with no reported reaction, she has allergie to penicillin.  I showed patient were she had already taken this medication this morning, she took medication no adverse reaction noted at this time.

## 2017-12-19 NOTE — Progress Notes (Signed)
PROGRESS NOTE    [name]  BDZ:[id number] DOB: July 05, 1976 DOA: 11/20/2017 PCP: Sandi Mariscal, MD      Brief Narrative:  [name] is a 42 y.o. female with a history of morbid obesity (BMI 86), IDDM, OSA, HTN, Cushing's disease and depression who presented 3/13 after suicide attempt by intentional overdose of baclofen, trazodone, and tramadol. She reportedly was encephalopathic and experienced a seizure, intubated for airway protection and admitted to the ICU. After extubation she developed stridor initially responsive to racemic epinephrine with laryngoscopy revealing fixed vocal cords, though when this recurred 3/21 it was unresponsive necessitating tracheostomy with laryngoscopy revealing circumferential subglottic swelling with necrotic debris and fibrinous exudate. Respiratory status has stabilized, though she continued to have significant secretions with odor. Clindamycin was started 3/27 and ENT reevaluated the patient 4/2 due to CT showing left paralaryngeal abscess. Flexible laryngoscopy was repeated showing pooled secretions in pharynx, and inability to assess vocal cord mobility or subglottic patency due to poor laryngeal airway. Psychiatry has continued to recommend transfer to inpatient psychiatry for suicidal ideation and attempt when medically stable.     Assessment & Plan:  Acute hypoxic respiratory failure due to trazodone, baclofen, tramadol overdose Tracheostomy wound infection Subglottic stenosis Laryngoscopy yesterday unremarkable, discussed with ENT.  Still stenosis, likely will need trach for months to >1 year.  However, infection seems mostly resolved. -Stop Zosyn, transition to Augmentin -RT will eval trach cuff to see if flange irritations can be improved in some way    Intentional overdose Suicidal ideation Depression Patient has been intermittently suicidal in hospital, has severe new health problems and made a serious attempt at suicide.  For now, still  inpatient placement for Psych needed, Dr. Mariea Clonts will follow -Continue bupropion -Consult Psych, appreciate cares  Acute kidney injury Resolved.  Type 2 diabetes, with hyperglycemia, dependent Glucose is good -No change to Lantus, SSI  Leg swelling Combination venous insufficiency.  Had some presumed pulmonary edema earlier in hospitalization, no congestive heart failure. -TED hose  Seizure disorder -Continue gaba  Hypothyroidism -Continue levothyroxine  Morbid obesity -Continue pro-stat supplement  Cushing's disease  Hypertension BP good -Continue metop  Normocytic anemia Etiology from acute illness.  Stable.  Hyperkalemia Resolved  Other medications -Continue pantoprazole -Continue cholestyramine  OSA Previously on CPAP.  Desats here overnight. -Supplemental oxygen at night -Morning ABG tomorrow      DVT prophylaxis: Lovenox Code Status: FULL Family Communication: None present MDM and disposition Plan: The below labs and imaging reports were reviewed, summarized above.  The patient presented with life threatening overdose, now with tracheostomy placement, wound infection and subglottic stenosis requiring IV antibiotics, now converted to orals.  She is being weaned from O2, adjusting to chronic trach. patient's status is clinically improving.  Pending inpatient psych placement.   Consultants:   ENT  Psychiatry  Procedures:   Laryngoscopy 4/10  PROCEDURE DETAILS: The patient was taken to the operating room and placed on the operating table in the supine position. Following induction of general endotracheal anesthesia, the table was turned 90 degrees.  A maxillary tooth protector was used throughout the case.  A Jako laryngoscope was entered into the oral cavity used to inspect the larynx.  The cords were swollen, the arytenoids were edematous as well.  The subglottic larynx revealed irregular granular swollen tissue with some fibrinous exudate, not as  necrotic appearing weeks ago but still with significant obstruction.  I was able to downsize the tracheostomy to a 6 mm cuffed adjustable length trach  tube.  The previous tracheostomy tube was partially occluded so that was removed in order to facilitate ventilation during the case.  Patient was then awakened and transferred to recovery in stable condition.  PATIENT DISPOSITION:  To PACU, stable  Addendum: Of note, the wound at the inferior aspect of the tracheostomy site was granulating and did not appear to be infected at all.  I placed a moist and 4 x 4 in this wound and kept most of it hanging out for safety reasons.  The tracheostomy tube can be deflated as soon as she is awake enough.  She may resume her diet.  She is stable for transfer to whichever facility is going to be appropriate.  She will likely require the tracheostomy for the foreseeable future, as she is going to be very slow to heal this issue.   LE [place] 4/7 Final Interpretation: Right: Portions of this examination were limited- see technologist comments above. No cystic structure found in the popliteal fossa. Techniquely limited due to patient body habitus and critical condition. Therefore, this study is inconclusive. Left: Portions of this examination were limited- see technologist comments above. No cystic structure found in the popliteal fossa. Techniquely limited due to patient body habitus and critical condition. Therefore, this study is inconclusive.     Antimicrobials:   Clindamycin  Zosyn   Augmentin   Subjective: No new fever, cough, dyspnea.  Trach hurts around where the flange rubs, but there is no induration, drainage, secretions.  Able to talke.  Leg swelling no change.  Objective: Vitals:   12/19/17 0407 12/19/17 0409 12/19/17 0500 12/19/17 0650  BP:  (!) 95/34    Pulse: 72 71    Resp: 20 18    Temp:  98 F (36.7 C)    TempSrc:      SpO2: 96% 96%    Weight:   (!) 145 kg (319 lb 10.7 oz) (!) 145.7  kg (321 lb 3.4 oz)  Height:        Intake/Output Summary (Last 24 hours) at 12/19/2017 0731 Last data filed at 12/18/2017 1439 Gross per 24 hour  Intake 750 ml  Output 5 ml  Net 745 ml   Filed Weights   12/17/17 0631 12/19/17 0500 12/19/17 0650  Weight: (!) 148.7 kg (327 lb 13.2 oz) (!) 145 kg (319 lb 10.7 oz) (!) 145.7 kg (321 lb 3.4 oz)    Examination: General appearance: Obese adult female, alert and in no acute distress.  Sitting up in chair, watching TV HEENT: Anicteric, conjunctivae pink, lids and lashes normal, no nasal deformity, no nasal discharge or epistaxis.  Lips moist, dentition normal.  Trach in place, there is no secretions or bleeding. Skin: Warm and dry.  There are scattered dried ulcers/scabs where the flange rubs from her trach cuff.  There is minimal surrounding redness, no induration or swelling. Cardiac: Heart rate regular, no murmurs, JVP not visible, lower extremity edema is nonpitting, slightly tender, no redness. Respiratory: Respiratory effort normal, lung sounds diminished throughout, no rales or wheezes. Abdomen: Abdomen soft.  No TTP. No ascites, distension, hepatosplenomegaly.   MSK: Deformities or effusions and large joints of the bilateral upper lower extremities. Neuro: Awake and alert, EOMI, moves all extremities, speech fluent. Psych: Oriented to person place and time, attention normal, affect normal, judgment and insight normal.   Data Reviewed: I have personally reviewed following labs and imaging studies:  CBC: Recent Labs  Lab 12/13/17 0605 12/18/17 0532  WBC 8.3 4.7  HGB  10.8* 9.6*  HCT 34.2* 31.2*  MCV 91.0 92.3  PLT 413* 299   Basic Metabolic Panel: Recent Labs  Lab 12/13/17 0605 12/13/17 1517 12/14/17 0552 12/15/17 0430 12/18/17 0532  NA 136  --  136 137 140  K 5.8* 5.8* 5.2* 4.7 4.4  CL 102  --  101 99* 101  CO2 26  --  24 28 29   GLUCOSE 205*  --  178* 174* 180*  BUN 15  --  15 16 11   CREATININE 0.85  --  0.97 1.01*  0.88  CALCIUM 9.1  --  8.9 8.8* 9.0   GFR: Estimated Creatinine Clearance: 114.9 mL/min (by C-G formula based on SCr of 0.88 mg/dL). Liver Function Tests: No results for input(s): AST, ALT, ALKPHOS, BILITOT, PROT, ALBUMIN in the last 168 hours. No results for input(s): LIPASE, AMYLASE in the last 168 hours. No results for input(s): AMMONIA in the last 168 hours. Coagulation Profile: No results for input(s): INR, PROTIME in the last 168 hours. Cardiac Enzymes: No results for input(s): CKTOTAL, CKMB, CKMBINDEX, TROPONINI in the last 168 hours. BNP (last 3 results) No results for input(s): PROBNP in the last 8760 hours. HbA1C: No results for input(s): HGBA1C in the last 72 hours. CBG: Recent Labs  Lab 12/18/17 0631 12/18/17 1104 12/18/17 1742 12/18/17 2122 12/19/17 0620  GLUCAP 180* 162* 166* 186* 148*   Lipid Profile: No results for input(s): CHOL, HDL, LDLCALC, TRIG, CHOLHDL, LDLDIRECT in the last 72 hours. Thyroid Function Tests: No results for input(s): TSH, T4TOTAL, FREET4, T3FREE, THYROIDAB in the last 72 hours. Anemia Panel: No results for input(s): VITAMINB12, FOLATE, FERRITIN, TIBC, IRON, RETICCTPCT in the last 72 hours. Urine analysis:    Component Value Date/Time   COLORURINE RED (A) 10/31/2016 2130   APPEARANCEUR CLOUDY (A) 10/31/2016 2130   LABSPEC 1.027 10/31/2016 2130   PHURINE 6.0 10/31/2016 2130   GLUCOSEU >=500 (A) 10/31/2016 2130   HGBUR LARGE (A) 10/31/2016 2130   BILIRUBINUR NEGATIVE 10/31/2016 2130   [place] NEGATIVE 10/31/2016 2130   PROTEINUR 100 (A) 10/31/2016 2130   UROBILINOGEN 0.2 04/09/2014 1157   NITRITE NEGATIVE 10/31/2016 2130   LEUKOCYTESUR NEGATIVE 10/31/2016 2130   Sepsis Labs: @LABRCNTIP (procalcitonin:4,lacticacidven:4)  )No results found for this or any previous visit (from the past 240 hour(s)).       Radiology Studies: No results found.      Scheduled Meds: . amoxicillin-clavulanate  1 tablet Oral Q12H  .  buPROPion  100 mg Oral BID  . chlorhexidine  15 mL Mouth Rinse BID  . cholestyramine  4 g Oral QODAY  . enoxaparin (LOVENOX) injection  70 mg Subcutaneous Q24H  . feeding supplement (PRO-STAT SUGAR FREE 64)  30 mL Oral BID  . gabapentin  600 mg Oral BID  . Gerhardt's butt cream   Topical Daily  . insulin aspart  0-20 Units Subcutaneous TID WC  . insulin aspart  8 Units Subcutaneous TID WC  . insulin glargine  40 Units Subcutaneous QHS  . levothyroxine  75 mcg Oral QAC breakfast  . mouth rinse  15 mL Mouth Rinse q12n4p  . metoprolol tartrate  12.5 mg Oral BID  . pantoprazole  40 mg Oral BID AC  . Racepinephrine HCl  0.5 mL Nebulization Once   Continuous Infusions:    LOS: 29 days    Time spent: 30 minutes    Edwin Dada, MD [facility] 12/19/2017, 10:21 AM     Pager [phone] --- please page though AMION:  [email or web address] Password [email or web address] If 7PM-7AM, please contact night-coverage

## 2017-12-19 NOTE — Progress Notes (Signed)
12/19/17 Patient's husband is concerned about patient's d/c readiness. Says patient is upset with him because he informed her she is best suited for a brief mental  Health stay in a psych hospital due to lack of 24 hour supervision when discharged. He says he's unable to miss anymore work. He said the patient stated she will go somewhere else and refuses admission in a mental facility. Husband doesn't believe the patient is stable.

## 2017-12-19 NOTE — Progress Notes (Signed)
Occupational Therapy Treatment Patient Details Name: [name] MRN: [id number] DOB: 1976/03/04 Today's Date: 12/19/2017    History of present illness Patient is a 42 y/o female who presents with drug OD and Sz. Intubated 3/13-3/15. resp failure with emergent trach 3/21 due to vocal cord dysfunction. CT of throat- suggests left paralaryngeal abscess. s/p Laryngoscopy 4/10. PMH includes ORIF RLE, depression, anxiety, morbid obesity, DM, Cushing's, Rt uni knee replacement, HTN, left ankle surgery, dyslipidemia, hypothyroidism. Underwent larangyscopy with trach downsized to 62mm. 4/10.   OT comments  Pt continues to make progress. Brighter affect and continues to talk about her anxiety and stress of her situation and being away from her children. She is upset that she will be away from her daughter on her 13th Birthday. Began discussing coping strategies.  Pt requires min A with LB ADL. Will assess use of AE to help maximize her independence. Discussed with nursing director and received OK for pt to have AE for hygiene after toileting if needed.  Will continue to follow acutely.  Follow Up Recommendations  Other (comment)(psych OT)    Equipment Recommendations  Other (comment)(TBA at next venue)    Recommendations for Other Services      Precautions / Restrictions Precautions Precautions: Other (comment)(suicide) Precaution Comments: trach       Mobility Bed Mobility                  Transfers                      Balance                                           ADL either performed or assessed with clinical judgement   ADL                                         General ADL Comments: discussed self care adn any obstacles. Pt has difficulty with pericare after having a BM and she has a fear of "messing up " her trach site. Educated on toilet tongs - pt would like to try to see if this helps her be more independent and less  stressed withthis task. Pt uses a sock aid at home. Discussed the possible use of a reacher for LB ADL.      Vision       Perception     Praxis      Cognition Arousal/Alertness: Awake/alert Behavior During Therapy: WFL for tasks assessed/performed Overall Cognitive Status: Within Functional Limits for tasks assessed                                 General Comments: Pt indicating that she misses her kids adn that she feels that she will be OK to DC home instead of going to an inpatient psych admission. Staes "It's too far away from my kids". Pt staets the "doctor said I did'nt need to go anymore". Pysch note indicates that pt contineus to warrent an inpatient psych admission - see note.         Exercises     Shoulder Instructions       General Comments Pt states she used the relaxation techniqeus  of progressive muscle relaxation in addition to guided imagery. Pt very appreciative adn states she used that while waiting for surgery.    Pertinent Vitals/ Pain       Pain Assessment: Faces Faces Pain Scale: Hurts a little bit Pain Location: trach site Pain Descriptors / Indicators: Discomfort;Grimacing Pain Intervention(s): Limited activity within patient's tolerance  Home Living                                          Prior Functioning/Environment              Frequency  Min 2X/week        Progress Toward Goals  OT Goals(current goals can now be found in the care plan section)  Progress towards OT goals: Progressing toward goals  Acute Rehab OT Goals Patient Stated Goal: to go home OT Goal Formulation: With patient Time For Goal Achievement: 12/26/17 Potential to Achieve Goals: Good ADL Goals Pt Will Perform Grooming: with supervision;standing Pt Will Perform Upper Body Bathing: with set-up;with supervision;sitting Pt Will Perform Lower Body Bathing: with min guard assist;with adaptive equipment;sit to/from stand Pt Will  Transfer to Toilet: with supervision;ambulating;regular height toilet;grab bars Pt Will Perform Toileting - Clothing Manipulation and hygiene: with min guard assist;sit to/from stand Pt/caregiver will Perform Home Exercise Program: Increased strength;Both right and left upper extremity;With theraband;With written HEP provided;With Supervision Additional ADL Goal #3: Pt will demonstrate  a self paced relazation technique to reduce anxiety symptoms  Plan Discharge plan remains appropriate;Frequency needs to be updated    Co-evaluation                 AM-PAC PT "6 Clicks" Daily Activity     Outcome Measure   Help from another person eating meals?: None Help from another person taking care of personal grooming?: None Help from another person toileting, which includes using toliet, bedpan, or urinal?: A Little Help from another person bathing (including washing, rinsing, drying)?: A Little Help from another person to put on and taking off regular upper body clothing?: A Little Help from another person to put on and taking off regular lower body clothing?: A Little 6 Click Score: 20    End of Session    OT Visit Diagnosis: Muscle weakness (generalized) (M62.81)   Activity Tolerance Patient tolerated treatment well   Patient Left in chair;with call bell/phone within reach;with nursing/sitter in room   Nurse Communication Other (comment)(need for toilet tong)        Time: 1440-1500 OT Time Calculation (min): 20 min  Charges: OT General Charges $OT Visit: 1 Visit OT Treatments $Therapeutic Activity: 8-22 mins  [facility], OT/L  [phone] 12/19/2017   Seymone Forlenza,HILLARY 12/19/2017, 3:26 PM

## 2017-12-20 LAB — GLUCOSE, CAPILLARY
GLUCOSE-CAPILLARY: 135 mg/dL — AB (ref 65–99)
GLUCOSE-CAPILLARY: 115 mg/dL — AB (ref 65–99)
Glucose-Capillary: 158 mg/dL — ABNORMAL HIGH (ref 65–99)
Glucose-Capillary: 104 mg/dL — ABNORMAL HIGH (ref 65–99)

## 2017-12-20 LAB — BLOOD GAS, ARTERIAL
Acid-Base Excess: 3.3 mmol/L — ABNORMAL HIGH (ref 0.0–2.0)
BICARBONATE: 27.5 mmol/L (ref 20.0–28.0)
DRAWN BY: 44135
FIO2: 28
O2 Saturation: 97.9 %
PATIENT TEMPERATURE: 98.6
PH ART: 7.42 (ref 7.350–7.450)
PO2 ART: 103 mmHg (ref 83.0–108.0)
pCO2 arterial: 43.3 mmHg (ref 32.0–48.0)

## 2017-12-20 NOTE — Progress Notes (Signed)
  Speech Language Pathology Treatment: [name] Speaking valve  Patient Details Name: [name] MRN: [id number] DOB: 1976-05-03 Today's Date: 12/20/2017 Time: 1010-1035 SLP Time Calculation (min) (ACUTE ONLY): 25 min  Assessment / Plan / Recommendation Clinical Impression  Pt s/p direct laryngoscopy 4/10 with trach changed to cuffed #6 Portex.  Continues with granulated but healing wound inferior aspect of trach site; edematous vocal folds and arytenoids; granular tissue subglottic larynx causing significant obstruction.  Pt not using PMV upon entering room today - cuff partially inflated. Removed air from balloon line, valve placed, pt achieved functional voice.  No air trapping upon removal.  Pt able to place/remove valve independently; we reviewed safety issues, precautions with valve.    Pt will benefit from a change to a cuffless trach prior to D/C. D/W Dr. Loleta Books.    HPI HPI: Patient is a 42 y/o female who presents with drug OD and Sz. Intubated 3/13-3/15. resp failure with emergent trach 3/21.  ENT flexible laryngoscopy revealed posterior supraglottic swelling; swollen vocal folds with no abduction; edematous arytenoids; entire subglottis circumferentially swollen with necrotic debris and fibrinous exudate.       SLP Plan  Continue with current plan of care       Recommendations         Patient may use Passy-Muir Speech Valve: During all waking hours (remove during sleep) PMSV Supervision: Intermittent MD: Please consider changing trach tube to : Cuffless         Oral Care Recommendations: Oral care BID SLP Visit Diagnosis: Aphonia (R49.1) Plan: Continue with current plan of care       GO                [name] 12/20/2017, 1:28 PM

## 2017-12-20 NOTE — Progress Notes (Signed)
CSW following for discharge plan. CSW met with patient and patient's mother at bedside, to discuss discharge plan to [place] answered questions that patient and mother had about placement. Patient is afraid that she will go there and never be allowed to leave. CSW reassured patient that she will only be there temporarily for inpatient psychiatric care, she will not be there forever. Patient was tearful during discussion and asked for information about where she'd be going.  CSW researched information and printed out for patient to review. CSW provided information to the patient and continued to answer her questions. Patient wrote down questions on her white board, expressed that she wants to go home and she doesn't want to miss her baby girl's birthday on 4/24. Patient asked if there was any way that she could do both things at the same time (i.e. getting medical treatment for her trach site as well as psychiatric care). CSW explained that the patient can't receive the medical care that she needs at [facility] and there isn't psych available at the intensity that she needs it while she's here at [facility]. Patient asked if a therapist or counselor could even come and talk to her a couple times a week while she's waiting on being able to transfer, it would be really help. Patient says she feels like she's going crazy just sitting here.  CSW will continue to follow.  Laveda Abbe, [place] Clinical Social Worker [phone]

## 2017-12-20 NOTE — Progress Notes (Signed)
PROGRESS NOTE    [name]  ASN:[id number] DOB: Apr 13, 1976 DOA: 11/20/2017 PCP: [name] Mariscal, MD      Brief Narrative:  [name] is a 42 y.o. female with a history of morbid obesity (BMI 47), IDDM, OSA, HTN, Cushing's disease and depression who presented 3/13 after suicide attempt by intentional overdose of baclofen, trazodone, and tramadol. She reportedly was encephalopathic and experienced a seizure, intubated for airway protection and admitted to the ICU. After extubation she developed stridor initially responsive to racemic epinephrine with laryngoscopy revealing fixed vocal cords, though when this recurred 3/21 it was unresponsive necessitating tracheostomy with laryngoscopy revealing circumferential subglottic swelling with necrotic debris and fibrinous exudate. Respiratory status has stabilized, though she continued to have significant secretions with odor. Clindamycin was started 3/27 and ENT reevaluated the patient 4/2 due to CT showing left paralaryngeal abscess. Flexible laryngoscopy was repeated showing pooled secretions in pharynx, and inability to assess vocal cord mobility or subglottic patency due to poor laryngeal airway. Psychiatry has continued to recommend transfer to inpatient psychiatry for suicidal ideation and attempt when medically stable.     Assessment & Plan:  Acute hypoxic respiratory failure due to trazodone, baclofen, tramadol overdose Tracheostomy wound infection Subglottic stenosis Laryngoscopy yesterday unremarkable, discussed with ENT.  Still stenosis, likely will need trach for months to >1 year.  However, infection seems mostly resolved. -Continue Augmentin      Intentional overdose Suicidal ideation Depression Patient has been intermittently suicidal in hospital, has severe new health problems and made a serious attempt at suicide.  For now, still inpatient placement for Psych needed, Dr. Mariea Clonts will follow -Continue bupropion n -Consult  Psych, appreciate cares  Acute kidney injury Resolved.  Type 2 diabetes, with hyperglycemia, dependent Glucose is good -Continue Lantus, SSI  Leg swelling Combination venous insufficiency.  Had some presumed pulmonary edema earlier in hospitalization, no congestive heart failure. -TED hose  Seizure disorder -Continue gabapentin  Hypothyroidism -Continue levothyroxine  Morbid obesity -Continue pro-stat supplement  Cushing's disease  Hypertension BP good -Continue metoprolol  Normocytic anemia Etiology from acute illness.  Stable.  Hyperkalemia Resolved  Other medications -Continue pantoprazole -Continue cholestyramine  OSA Previously on CPAP.  Desats here overnight. -Continue supplemental oxygen at night      DVT prophylaxis: Lovenox Code Status: FULL Family Communication: None present MDM and disposition Plan: Below labs and imaging reports were reviewed, summarized above.  Patient presented with lightening overdose, now tracheostomy placed, wound infection and stomach with glottic stenosis, requiring IV antibiotics, now converted to oral antibiotics.  She is weaned from O2, pending psych placement.    Consultants:   ENT  Psychiatry  Procedures:   Laryngoscopy 4/10  PROCEDURE DETAILS: The patient was taken to the operating room and placed on the operating table in the supine position. Following induction of general endotracheal anesthesia, the table was turned 90 degrees.  A maxillary tooth protector was used throughout the case.  A Jako laryngoscope was entered into the oral cavity used to inspect the larynx.  The cords were swollen, the arytenoids were edematous as well.  The subglottic larynx revealed irregular granular swollen tissue with some fibrinous exudate, not as necrotic appearing weeks ago but still with significant obstruction.  I was able to downsize the tracheostomy to a 6 mm cuffed adjustable length trach tube.  The previous tracheostomy  tube was partially occluded so that was removed in order to facilitate ventilation during the case.  Patient was then awakened and transferred to recovery in  stable condition.  PATIENT DISPOSITION:  To PACU, stable  Addendum: Of note, the wound at the inferior aspect of the tracheostomy site was granulating and did not appear to be infected at all.  I placed a moist and 4 x 4 in this wound and kept most of it hanging out for safety reasons.  The tracheostomy tube can be deflated as soon as she is awake enough.  She may resume her diet.  She is stable for transfer to whichever facility is going to be appropriate.  She will likely require the tracheostomy for the foreseeable future, as she is going to be very slow to heal this issue.   LE [place] 4/7 Final Interpretation: Right: Portions of this examination were limited- see technologist comments above. No cystic structure found in the popliteal fossa. Techniquely limited due to patient body habitus and critical condition. Therefore, this study is inconclusive. Left: Portions of this examination were limited- see technologist comments above. No cystic structure found in the popliteal fossa. Techniquely limited due to patient body habitus and critical condition. Therefore, this study is inconclusive.     Antimicrobials:   Clindamycin  Zosyn   Augmentin   Subjective: No fever, cough, dyspnea, trach secretions, vomiting, pain, drainage from trach, redness, swelling, induration.  Leg swelling no change.     Objective: Vitals:   12/20/17 0100 12/20/17 0420 12/20/17 0425 12/20/17 0749  BP:  (!) 101/41  (!) 108/54  Pulse:  86 74 92  Resp:  18 18 18   Temp:  98.3 F (36.8 C)  98.2 F (36.8 C)  TempSrc:  Oral  Oral  SpO2: 96% 99% 99% 94%  Weight:      Height:        Intake/Output Summary (Last 24 hours) at 12/20/2017 0750 Last data filed at 12/19/2017 1700 Gross per 24 hour  Intake 240 ml  Output -  Net 240 ml   Filed Weights    12/17/17 0631 12/19/17 0500 12/19/17 0650  Weight: (!) 148.7 kg (327 lb 13.2 oz) (!) 145 kg (319 lb 10.7 oz) (!) 145.7 kg (321 lb 3.4 oz)    Examination: General appearance: [name], sitting in chair, no acute distress HEENT: Anicteric, conjunctivae pink, lids and lashes normal, no nasal deformity, no nasal discharge or epistaxis.  Lips moist, dentition normal.  Trach in place, there is no secretions or bleeding. Skin: Warm and dry, no redness, swelling, induration around her trach. Cardiac: RRR, no murmurs, lower extremity edema, nonpitting, no tenderness. Respiratory: Lung sounds diminished, respiratory effort normal, no rales or wheezes.  Abdomen: Abdomen soft.  No TTP. No ascites, distension, hepatosplenomegaly.   MSK: Deformities or effusions and large joints of the bilateral upper lower extremities. Neuro: Awake and alert, EOMI, moves all extremities, speech fluent. Psych: Oriented to person place and time, attention normal, affect and judgment normal.   Data Reviewed: I have personally reviewed following labs and imaging studies:  CBC: Recent Labs  Lab 12/18/17 0532  WBC 4.7  HGB 9.6*  HCT 31.2*  MCV 92.3  PLT 147   Basic Metabolic Panel: Recent Labs  Lab 12/13/17 1517 12/14/17 0552 12/15/17 0430 12/18/17 0532  NA  --  136 137 140  K 5.8* 5.2* 4.7 4.4  CL  --  101 99* 101  CO2  --  24 28 29   GLUCOSE  --  178* 174* 180*  BUN  --  15 16 11   CREATININE  --  0.97 1.01* 0.88  CALCIUM  --  8.9 8.8* 9.0   GFR: Estimated Creatinine Clearance: 114.9 mL/min (by C-G formula based on SCr of 0.88 mg/dL). Liver Function Tests: No results for input(s): AST, ALT, ALKPHOS, BILITOT, PROT, ALBUMIN in the last 168 hours. No results for input(s): LIPASE, AMYLASE in the last 168 hours. No results for input(s): AMMONIA in the last 168 hours. Coagulation Profile: No results for input(s): INR, PROTIME in the last 168 hours. Cardiac Enzymes: No results for input(s):  CKTOTAL, CKMB, CKMBINDEX, TROPONINI in the last 168 hours. BNP (last 3 results) No results for input(s): PROBNP in the last 8760 hours. HbA1C: No results for input(s): HGBA1C in the last 72 hours. CBG: Recent Labs  Lab 12/19/17 0620 12/19/17 1134 12/19/17 1703 12/19/17 2142 12/20/17 0627  GLUCAP 148* 229* 185* 151* 135*   Lipid Profile: No results for input(s): CHOL, HDL, LDLCALC, TRIG, CHOLHDL, LDLDIRECT in the last 72 hours. Thyroid Function Tests: No results for input(s): TSH, T4TOTAL, FREET4, T3FREE, THYROIDAB in the last 72 hours. Anemia Panel: No results for input(s): VITAMINB12, FOLATE, FERRITIN, TIBC, IRON, RETICCTPCT in the last 72 hours. Urine analysis:    Component Value Date/Time   COLORURINE RED (A) 10/31/2016 2130   APPEARANCEUR CLOUDY (A) 10/31/2016 2130   LABSPEC 1.027 10/31/2016 2130   PHURINE 6.0 10/31/2016 2130   GLUCOSEU >=500 (A) 10/31/2016 2130   HGBUR LARGE (A) 10/31/2016 2130   BILIRUBINUR NEGATIVE 10/31/2016 2130   [place] NEGATIVE 10/31/2016 2130   PROTEINUR 100 (A) 10/31/2016 2130   UROBILINOGEN 0.2 04/09/2014 1157   NITRITE NEGATIVE 10/31/2016 2130   LEUKOCYTESUR NEGATIVE 10/31/2016 2130   Sepsis Labs: @LABRCNTIP (procalcitonin:4,lacticacidven:4)  )No results found for this or any previous visit (from the past 240 hour(s)).       Radiology Studies: No results found.      Scheduled Meds: . amoxicillin-clavulanate  1 tablet Oral Q12H  . buPROPion  100 mg Oral BID  . chlorhexidine  15 mL Mouth Rinse BID  . cholestyramine  4 g Oral QODAY  . enoxaparin (LOVENOX) injection  70 mg Subcutaneous Q24H  . feeding supplement (PRO-STAT SUGAR FREE 64)  30 mL Oral BID  . gabapentin  600 mg Oral BID  . Gerhardt's butt cream   Topical Daily  . insulin aspart  0-20 Units Subcutaneous TID WC  . insulin aspart  8 Units Subcutaneous TID WC  . insulin glargine  40 Units Subcutaneous QHS  . levothyroxine  75 mcg Oral QAC breakfast  . mouth rinse   15 mL Mouth Rinse q12n4p  . metoprolol tartrate  12.5 mg Oral BID  . pantoprazole  40 mg Oral BID AC  . Racepinephrine HCl  0.5 mL Nebulization Once   Continuous Infusions:    LOS: 30 days    Time spent: 20 minutes    Edwin Dada, MD [facility] 12/20/2017, 7:50 AM     Pager [phone] --- please page though AMION:  [email or web address] Password [email or web address] If 7PM-7AM, please contact night-coverage

## 2017-12-20 NOTE — Progress Notes (Signed)
Patient required suctioning several times overnight she responded well to pain med and sleep medication, she does have anxiety that inhibits her breathing and long with the pain component she had worked herself up. Once she was able to fall asleep she had an easer time breathing.

## 2017-12-20 NOTE — Progress Notes (Signed)
RT called to suction patient. RT got scant thick tan secretions. Patient has strong cough and breath sounds are clear diminished. Patient's vitals are stable. Patient remains anxious and worried about trach. RT assured patient that we are monitoring her closely and are providing the care needed to maintain a patent airway. RT will continue to monitor patient. RN is aware.

## 2017-12-21 DIAGNOSIS — E109 Type 1 diabetes mellitus without complications: Secondary | ICD-10-CM

## 2017-12-21 DIAGNOSIS — R0609 Other forms of dyspnea: Secondary | ICD-10-CM

## 2017-12-21 DIAGNOSIS — I10 Essential (primary) hypertension: Secondary | ICD-10-CM

## 2017-12-21 LAB — GLUCOSE, CAPILLARY
GLUCOSE-CAPILLARY: 125 mg/dL — AB (ref 65–99)
GLUCOSE-CAPILLARY: 146 mg/dL — AB (ref 65–99)
Glucose-Capillary: 143 mg/dL — ABNORMAL HIGH (ref 65–99)
Glucose-Capillary: 137 mg/dL — ABNORMAL HIGH (ref 65–99)
Glucose-Capillary: 139 mg/dL — ABNORMAL HIGH (ref 65–99)

## 2017-12-21 MED ORDER — FUROSEMIDE 10 MG/ML IJ SOLN
20.0000 mg | Freq: Once | INTRAMUSCULAR | Status: AC
  Administered 2017-12-21: 20 mg via INTRAVENOUS
  Filled 2017-12-21: qty 4

## 2017-12-21 NOTE — Progress Notes (Signed)
PROGRESS NOTE    [name]  YIR:[id number] DOB: 1975-12-29 DOA: 11/20/2017 PCP: Sandi Mariscal, MD      Brief Narrative:  [name] is a 42 y.o. female with a history of morbid obesity (BMI 59), IDDM, OSA, HTN, Cushing's disease and depression who presented 3/13 after suicide attempt by intentional overdose of baclofen, trazodone, and tramadol. She reportedly was encephalopathic and experienced a seizure, intubated for airway protection and admitted to the ICU. After extubation she developed stridor initially responsive to racemic epinephrine with laryngoscopy revealing fixed vocal cords, though when this recurred 3/21 it was unresponsive necessitating tracheostomy with laryngoscopy revealing circumferential subglottic swelling with necrotic debris and fibrinous exudate. Respiratory status has stabilized, though she continued to have significant secretions with odor. Clindamycin was started 3/27 and ENT reevaluated the patient 4/2 due to CT showing left paralaryngeal abscess. Flexible laryngoscopy was repeated showing pooled secretions in pharynx, and inability to assess vocal cord mobility or subglottic patency due to poor laryngeal airway. Psychiatry has continued to recommend transfer to inpatient psychiatry for suicidal ideation and attempt when medically stable.     Assessment & Plan:  Acute hypoxic respiratory failure due to trazodone, baclofen, tramadol overdose Tracheostomy wound infection Subglottic stenosis Ejection resolving, no new discharge. -Continue Augmentin, last day today    Intentional overdose Suicidal ideation Depression Patient has been intermittently suicidal in hospital, has severe new health problems and made a serious attempt at suicide.  For now, still inpatient placement for Psych needed, Dr. Mariea Clonts will follow -Continue bupropion -Consult psych, appreciate cares  Acute kidney injury Resolved.  Type 2 diabetes, with hyperglycemia,  insulin-dependent Glucose is well controlled here -Continue Lantus -Continue SSI  Leg swelling Dyspnea on exertion Has chronic venous insufficiency.  However now leg swelling worse, now pitting, as well as shortness of breath with exertion.  No improvement with Lasix earlier in hospitalization -Obtain BNP -Check echocardiogram -Trial Lasix again -Trial of albuterol  Seizure disorder -Continue gabapentin  Hypothyroidism -Continue levothyroxine  Morbid obesity -Continue pro-stat supplement  Cushing's disease  Hypertension Blood pressure at goal -Continue metoprolol  Normocytic anemia Etiology from acute illness.  Stable.  Hyperkalemia Resolved  Other medications -Continue pantoprazole -Continue cholestyramine  OSA Previously on CPAP.  Desats here overnight.  Morning ABG without hypercapnia. -Continue supplemental oxygen at night      DVT prophylaxis: Lovenox Code Status: FULL Family Communication: None present MDM and disposition Plan: Below labs and imaging reports were reviewed, summarized above.  Patient presented with overdose, required intubation, now tracheostomy in place.  Subsequent subglottic stenosis and infection.  Infection mostly resolved.  On supplemental oxygen at night only.  Mild dyspnea on exertion, differential includes deconditioning versus CHF.  Workup today.     Consultants:   ENT  Psychiatry  Procedures:   Laryngoscopy 4/10  PROCEDURE DETAILS: The patient was taken to the operating room and placed on the operating table in the supine position. Following induction of general endotracheal anesthesia, the table was turned 90 degrees.  A maxillary tooth protector was used throughout the case.  A Jako laryngoscope was entered into the oral cavity used to inspect the larynx.  The cords were swollen, the arytenoids were edematous as well.  The subglottic larynx revealed irregular granular swollen tissue with some fibrinous exudate, not as  necrotic appearing weeks ago but still with significant obstruction.  I was able to downsize the tracheostomy to a 6 mm cuffed adjustable length trach tube.  The previous tracheostomy tube was partially  occluded so that was removed in order to facilitate ventilation during the case.  Patient was then awakened and transferred to recovery in stable condition.  PATIENT DISPOSITION:  To PACU, stable  Addendum: Of note, the wound at the inferior aspect of the tracheostomy site was granulating and did not appear to be infected at all.  I placed a moist and 4 x 4 in this wound and kept most of it hanging out for safety reasons.  The tracheostomy tube can be deflated as soon as she is awake enough.  She may resume her diet.  She is stable for transfer to whichever facility is going to be appropriate.  She will likely require the tracheostomy for the foreseeable future, as she is going to be very slow to heal this issue.   LE [place] 4/7 Final Interpretation: Right: Portions of this examination were limited- see technologist comments above. No cystic structure found in the popliteal fossa. Techniquely limited due to patient body habitus and critical condition. Therefore, this study is inconclusive. Left: Portions of this examination were limited- see technologist comments above. No cystic structure found in the popliteal fossa. Techniquely limited due to patient body habitus and critical condition. Therefore, this study is inconclusive.     Antimicrobials:   Clindamycin  Zosyn   Augmentin   Subjective: No new fever, cough, trach secretions, drainage from trach, vomiting, redness or swelling around her trach site.  She has dyspnea with exertion, worsening leg swelling.  No orthopnea.  No PND.  No wheezing.  Objective: Vitals:   12/21/17 0352 12/21/17 0407 12/21/17 0730 12/21/17 0934  BP: (!) 96/50   128/71  Pulse: 67 68 76 82  Resp: 20 18 20 20   Temp:    97.7 F (36.5 C)  TempSrc:    Oral   SpO2: 97% 98% 97% 96%  Weight:      Height:        Intake/Output Summary (Last 24 hours) at 12/21/2017 1108 Last data filed at 12/21/2017 0758 Gross per 24 hour  Intake 600 ml  Output -  Net 600 ml   Filed Weights   12/17/17 0631 12/19/17 0500 12/19/17 0650  Weight: (!) 148.7 kg (327 lb 13.2 oz) (!) 145 kg (319 lb 10.7 oz) (!) 145.7 kg (321 lb 3.4 oz)    Examination: General appearance: Obese adult female, sitting up in a chair, prior to that, ambulating in the hall.  Dyspneic with exertion.  Interactive. HEENT: Anicteric, conjunctive are pink, lids and lashes normal, oropharynx normal, Mallampati 4, no oral lesions, hearing normal, trach in place, no secretions, no redness, induration, or swelling around trach site, no drainage. Skin: Warm and dry, no redness, swelling, induration around her trach. Cardiac: Regular rate and rhythm, no murmurs, 1+ pitting edema to the shin bilaterally which is new, tender ankles. Respiratory: Lung sounds diminished throughout, respiratory effort normal, no rales or wheezes  abdomen: Abdomen soft.  No TTP. No ascites, distension, hepatosplenomegaly.   MSK: No deformities or effusions in the large joints of the bilateral upper or lower extremities. Neuro: Gait slow and shuffling, otherwise awake and alert, extraocular movements intact, speech fluent around Passy-Muir valve. Psych: Oriented to person place and time, attention normal affect and judgment normal.         Data Reviewed: I have personally reviewed following labs and imaging studies:  CBC: Recent Labs  Lab 12/18/17 0532  WBC 4.7  HGB 9.6*  HCT 31.2*  MCV 92.3  PLT 314  Basic Metabolic Panel: Recent Labs  Lab 12/15/17 0430 12/18/17 0532  NA 137 140  K 4.7 4.4  CL 99* 101  CO2 28 29  GLUCOSE 174* 180*  BUN 16 11  CREATININE 1.01* 0.88  CALCIUM 8.8* 9.0   GFR: Estimated Creatinine Clearance: 114.9 mL/min (by C-G formula based on SCr of 0.88 mg/dL). Liver Function  Tests: No results for input(s): AST, ALT, ALKPHOS, BILITOT, PROT, ALBUMIN in the last 168 hours. No results for input(s): LIPASE, AMYLASE in the last 168 hours. No results for input(s): AMMONIA in the last 168 hours. Coagulation Profile: No results for input(s): INR, PROTIME in the last 168 hours. Cardiac Enzymes: No results for input(s): CKTOTAL, CKMB, CKMBINDEX, TROPONINI in the last 168 hours. BNP (last 3 results) No results for input(s): PROBNP in the last 8760 hours. HbA1C: No results for input(s): HGBA1C in the last 72 hours. CBG: Recent Labs  Lab 12/20/17 0627 12/20/17 1147 12/20/17 1734 12/20/17 2133 12/21/17 0645  GLUCAP 135* 158* 115* 104* 143*   Lipid Profile: No results for input(s): CHOL, HDL, LDLCALC, TRIG, CHOLHDL, LDLDIRECT in the last 72 hours. Thyroid Function Tests: No results for input(s): TSH, T4TOTAL, FREET4, T3FREE, THYROIDAB in the last 72 hours. Anemia Panel: No results for input(s): VITAMINB12, FOLATE, FERRITIN, TIBC, IRON, RETICCTPCT in the last 72 hours. Urine analysis:    Component Value Date/Time   COLORURINE RED (A) 10/31/2016 2130   APPEARANCEUR CLOUDY (A) 10/31/2016 2130   LABSPEC 1.027 10/31/2016 2130   PHURINE 6.0 10/31/2016 2130   GLUCOSEU >=500 (A) 10/31/2016 2130   HGBUR LARGE (A) 10/31/2016 2130   BILIRUBINUR NEGATIVE 10/31/2016 2130   [place] NEGATIVE 10/31/2016 2130   PROTEINUR 100 (A) 10/31/2016 2130   UROBILINOGEN 0.2 04/09/2014 1157   NITRITE NEGATIVE 10/31/2016 2130   LEUKOCYTESUR NEGATIVE 10/31/2016 2130   Sepsis Labs: @LABRCNTIP (procalcitonin:4,lacticacidven:4)  )No results found for this or any previous visit (from the past 240 hour(s)).       Radiology Studies: No results found.      Scheduled Meds: . amoxicillin-clavulanate  1 tablet Oral Q12H  . buPROPion  100 mg Oral BID  . chlorhexidine  15 mL Mouth Rinse BID  . cholestyramine  4 g Oral QODAY  . enoxaparin (LOVENOX) injection  70 mg Subcutaneous  Q24H  . feeding supplement (PRO-STAT SUGAR FREE 64)  30 mL Oral BID  . gabapentin  600 mg Oral BID  . Gerhardt's butt cream   Topical Daily  . insulin aspart  0-20 Units Subcutaneous TID WC  . insulin aspart  8 Units Subcutaneous TID WC  . insulin glargine  40 Units Subcutaneous QHS  . levothyroxine  75 mcg Oral QAC breakfast  . mouth rinse  15 mL Mouth Rinse q12n4p  . metoprolol tartrate  12.5 mg Oral BID  . pantoprazole  40 mg Oral BID AC  . Racepinephrine HCl  0.5 mL Nebulization Once   Continuous Infusions:    LOS: 31 days    Time spent: 35 minutes    Edwin Dada, MD [facility] 12/21/2017, 8:45 AM    Pager [phone] --- please page though AMION:  [email or web address] Password [email or web address] If 7PM-7AM, please contact night-coverage

## 2017-12-22 ENCOUNTER — Inpatient Hospital Stay (HOSPITAL_COMMUNITY): Payer: Medicaid Other

## 2017-12-22 DIAGNOSIS — R0600 Dyspnea, unspecified: Secondary | ICD-10-CM

## 2017-12-22 LAB — GLUCOSE, CAPILLARY
GLUCOSE-CAPILLARY: 156 mg/dL — AB (ref 65–99)
GLUCOSE-CAPILLARY: 106 mg/dL — AB (ref 65–99)
GLUCOSE-CAPILLARY: 175 mg/dL — AB (ref 65–99)
GLUCOSE-CAPILLARY: 171 mg/dL — AB (ref 65–99)

## 2017-12-22 LAB — BASIC METABOLIC PANEL
ANION GAP: 9 (ref 5–15)
BUN: 14 mg/dL (ref 6–20)
CALCIUM: 9.2 mg/dL (ref 8.9–10.3)
CO2: 26 mmol/L (ref 22–32)
CREATININE: 0.85 mg/dL (ref 0.44–1.00)
Chloride: 103 mmol/L (ref 101–111)
GFR calc Af Amer: >60 mL/min (ref >60)
GFR calc non Af Amer: >60 mL/min (ref >60)
GLUCOSE: 164 mg/dL — AB (ref 65–99)
Potassium: 4.7 mmol/L (ref 3.5–5.1)
Sodium: 138 mmol/L (ref 135–145)

## 2017-12-22 LAB — ECHOCARDIOGRAM COMPLETE
HEIGHTINCHES: 62 in
WEIGHTICAEL: 5206.38 [oz_av]

## 2017-12-22 LAB — BRAIN NATRIURETIC PEPTIDE: B Natriuretic Peptide: 33.5 pg/mL (ref 0.0–100.0)

## 2017-12-22 MED ORDER — PREDNISONE 20 MG PO TABS
40.0000 mg | ORAL_TABLET | Freq: Every day | ORAL | Status: DC
  Administered 2017-12-22 – 2017-12-23 (×2): 40 mg via ORAL
  Filled 2017-12-22 (×2): qty 2

## 2017-12-22 MED ORDER — IPRATROPIUM-ALBUTEROL 0.5-2.5 (3) MG/3ML IN SOLN
3.0000 mL | Freq: Four times a day (QID) | RESPIRATORY_TRACT | Status: DC
  Administered 2017-12-22 – 2017-12-23 (×5): 3 mL via RESPIRATORY_TRACT
  Filled 2017-12-22 (×5): qty 3

## 2017-12-22 MED ORDER — INSULIN ASPART 100 UNIT/ML ~~LOC~~ SOLN
0.0000 [IU] | Freq: Every day | SUBCUTANEOUS | Status: DC
  Administered 2017-12-23: 3 [IU] via SUBCUTANEOUS
  Administered 2017-12-27: 2 [IU] via SUBCUTANEOUS
  Administered 2017-12-30: 5 [IU] via SUBCUTANEOUS

## 2017-12-22 MED ORDER — FUROSEMIDE 10 MG/ML IJ SOLN
40.0000 mg | Freq: Every day | INTRAMUSCULAR | Status: DC
  Administered 2017-12-22: 40 mg via INTRAVENOUS
  Filled 2017-12-22: qty 4

## 2017-12-22 MED ORDER — ALBUTEROL SULFATE (2.5 MG/3ML) 0.083% IN NEBU
2.5000 mg | INHALATION_SOLUTION | RESPIRATORY_TRACT | Status: DC | PRN
  Administered 2017-12-22 – 2018-01-07 (×5): 2.5 mg via RESPIRATORY_TRACT
  Filled 2017-12-22 (×5): qty 3

## 2017-12-22 NOTE — Progress Notes (Signed)
PROGRESS NOTE    [name]  XTG:[id number] DOB: 27-Oct-1975 DOA: 11/20/2017 PCP: Sandi Mariscal, MD      Brief Narrative:  [name] is a 42 y.o. female with a history of morbid obesity (BMI 25), IDDM, OSA, HTN, Cushing's disease and depression who presented 3/13 after suicide attempt by intentional overdose of baclofen, trazodone, and tramadol. She reportedly was encephalopathic and experienced a seizure, intubated for airway protection and admitted to the ICU. After extubation she developed stridor initially responsive to racemic epinephrine with laryngoscopy revealing fixed vocal cords, though when this recurred 3/21 it was unresponsive necessitating tracheostomy with laryngoscopy revealing circumferential subglottic swelling with necrotic debris and fibrinous exudate. Respiratory status has stabilized, though she continued to have significant secretions with odor. Clindamycin was started 3/27 and ENT reevaluated the patient 4/2 due to CT showing left paralaryngeal abscess. Flexible laryngoscopy was repeated showing pooled secretions in pharynx, and inability to assess vocal cord mobility or subglottic patency due to poor laryngeal airway. Psychiatry has continued to recommend transfer to inpatient psychiatry for suicidal ideation and attempt when medically stable.     Assessment & Plan:  Acute hypoxic respiratory failure due to trazodone, baclofen, tramadol overdose Tracheostomy wound infection Subglottic stenosis Infection resolved.   -Continue Augmentin, last dose actually today    Tracheostomy in place Overnight patient has had some increase in stridor and increased respiratory effort, especially with exertion or emotional arousal.  Her O2 sats are normal with rest, desaturate with ambulation.  Overnight it became clear that suctioning of the tracheostomy tube now meets resistance.  This morning, it was recommended to exchange the tracheostomy, RT have declined to do so.  I  discussed with ENT on call, who deferred to Dr. Constance Holster in this patient who appears stable at rest. -If patient decompensates overnight, will re-page ENT on call  Dyspnea with exertion Leg swelling is marked, but BNP is low, no improvement with Lasix.   -Obtain echocardiogram -Continue Lasix until leg swelling improved -Prednisone 40 daily, start for 5 days, close monitoring sugars -Albuterol scheduled and PRN -Obtain CXR  Intentional overdose Suicidal ideation Depression -Continue bupropion  Acute kidney injury Resolved.  Type 2 diabetes, with hyperglycemia, insulin-dependent -Continue Lantus -Continue sliding scale, add at bedtime correction  Seizure disorder -Continue gabapentin  Hypothyroidism -Continue levothyroxine  Morbid obesity -Continue pro-stat supplement  Cushing's disease  Hypertension Blood pressure at goal -Continue metoprolol, Lasix  Normocytic anemia Etiology from acute illness.  Stable.  Hyperkalemia Resolved  Other medications -Continue pantoprazole, cholestyramine  OSA Previously on CPAP.  Desats here overnight.  Morning ABG without hypercapnia. -Continue supplemental oxygen at night      DVT prophylaxis: Lovenox Code Status: FULL Family Communication: None present MDM and disposition Plan: The below labs and imaging reports were reviewed, summarized above.  The patient presented with severe baclofen, trazodone, tramadol overdose, required intubation, complicated by subglottic infection and stenosis, now with tracheostomy in place, likely long-term tracheostomy.  Infection now resolved, on supplemental oxygen at night only.  She has some mild dyspnea on exertion, with a differential includes deconditioning versus CHF versus COPD.    Continuing workup, if CXR negative, Echo normal, will continue diuresis for two more days, steroids for 2 days, reassess response.       Consultants:   ENT  Psychiatry  Procedures:    Laryngoscopy 4/10  PROCEDURE DETAILS: The patient was taken to the operating room and placed on the operating table in the supine position. Following induction of general  endotracheal anesthesia, the table was turned 90 degrees.  A maxillary tooth protector was used throughout the case.  A Jako laryngoscope was entered into the oral cavity used to inspect the larynx.  The cords were swollen, the arytenoids were edematous as well.  The subglottic larynx revealed irregular granular swollen tissue with some fibrinous exudate, not as necrotic appearing weeks ago but still with significant obstruction.  I was able to downsize the tracheostomy to a 6 mm cuffed adjustable length trach tube.  The previous tracheostomy tube was partially occluded so that was removed in order to facilitate ventilation during the case.  Patient was then awakened and transferred to recovery in stable condition.  PATIENT DISPOSITION:  To PACU, stable  Addendum: Of note, the wound at the inferior aspect of the tracheostomy site was granulating and did not appear to be infected at all.  I placed a moist and 4 x 4 in this wound and kept most of it hanging out for safety reasons.  The tracheostomy tube can be deflated as soon as she is awake enough.  She may resume her diet.  She is stable for transfer to whichever facility is going to be appropriate.  She will likely require the tracheostomy for the foreseeable future, as she is going to be very slow to heal this issue.   LE [place] 4/7 Final Interpretation: Right: Portions of this examination were limited- see technologist comments above. No cystic structure found in the popliteal fossa. Techniquely limited due to patient body habitus and critical condition. Therefore, this study is inconclusive. Left: Portions of this examination were limited- see technologist comments above. No cystic structure found in the popliteal fossa. Techniquely limited due to patient body habitus and  critical condition. Therefore, this study is inconclusive.     Antimicrobials:   Clindamycin  Zosyn   Augmentin   Subjective: No fever, vomiting, redness or discharge from her trach site.  Overnight trach is harder to suction, meets resistance now.  She has dyspnea with exertion, unchanged leg swelling after Lasix yesterday.  She seems to feel better with albuterol.  She has no orthopnea or PND.  She is not wheezing or coughing up mucus.  Objective: Vitals:   12/22/17 0400 12/22/17 0807 12/22/17 0838 12/22/17 1241  BP: (!) 102/52  (!) 112/54   Pulse: 88 94  90  Resp: 20 20  (!) 21  Temp: 98.3 F (36.8 C)     TempSrc: Oral     SpO2: 98% 100%  99%  Weight: (!) 147.6 kg (325 lb 6.4 oz)     Height:        Intake/Output Summary (Last 24 hours) at 12/22/2017 1315 Last data filed at 12/22/2017 0930 Gross per 24 hour  Intake 480 ml  Output -  Net 480 ml   Filed Weights   12/19/17 0500 12/19/17 0650 12/22/17 0400  Weight: (!) 145 kg (319 lb 10.7 oz) (!) 145.7 kg (321 lb 3.4 oz) (!) 147.6 kg (325 lb 6.4 oz)    Examination: General appearance: Obese adult female, sitting up in a chair, appears dyspneic at rest, somewhat stridorous.  But interactive and able to speak clearly. HEENT: Anicteric, conjunctive are pink, lids and lashes normal, oropharynx normal, Mallon Patti 4+, no oral lesions, hearing normal.  Trach has some granulation tissue around it, no drainage, no redness or induration around it. Skin: Her skin is warm and dry, without redness swelling, induration. Cardiac: RRR, no murmurs, bilateral lower extremity edema, mostly nonpitting,  tender ankles. Respiratory: Respiratory effort somewhat increased, stridorous.  No rales or wheezes, breath sounds diminished throughout. abdomen: Abdomen soft.  No TTP. No ascites, distension, hepatosplenomegaly.   MSK: No deformity or effusions in the large joints of the upper or lower extremities bilaterally. Neuro: Speech fluent around  her Passy-Muir valve, awake and alert, oriented to person place and time, moves all extremities equally with normal strength. Psych: Attention normal, affect frustrated, judgment and insight normal.         Data Reviewed: I have personally reviewed following labs and imaging studies:  CBC: Recent Labs  Lab 12/18/17 0532  WBC 4.7  HGB 9.6*  HCT 31.2*  MCV 92.3  PLT 027   Basic Metabolic Panel: Recent Labs  Lab 12/18/17 0532 12/22/17 0259  NA 140 138  K 4.4 4.7  CL 101 103  CO2 29 26  GLUCOSE 180* 164*  BUN 11 14  CREATININE 0.88 0.85  CALCIUM 9.0 9.2   GFR: Estimated Creatinine Clearance: 120 mL/min (by C-G formula based on SCr of 0.85 mg/dL). Liver Function Tests: No results for input(s): AST, ALT, ALKPHOS, BILITOT, PROT, ALBUMIN in the last 168 hours. No results for input(s): LIPASE, AMYLASE in the last 168 hours. No results for input(s): AMMONIA in the last 168 hours. Coagulation Profile: No results for input(s): INR, PROTIME in the last 168 hours. Cardiac Enzymes: No results for input(s): CKTOTAL, CKMB, CKMBINDEX, TROPONINI in the last 168 hours. BNP (last 3 results) No results for input(s): PROBNP in the last 8760 hours. HbA1C: No results for input(s): HGBA1C in the last 72 hours. CBG: Recent Labs  Lab 12/21/17 1603 12/21/17 1942 12/21/17 2107 12/22/17 0601 12/22/17 1150  GLUCAP 146* 137* 139* 156* 106*   Lipid Profile: No results for input(s): CHOL, HDL, LDLCALC, TRIG, CHOLHDL, LDLDIRECT in the last 72 hours. Thyroid Function Tests: No results for input(s): TSH, T4TOTAL, FREET4, T3FREE, THYROIDAB in the last 72 hours. Anemia Panel: No results for input(s): VITAMINB12, FOLATE, FERRITIN, TIBC, IRON, RETICCTPCT in the last 72 hours. Urine analysis:    Component Value Date/Time   COLORURINE RED (A) 10/31/2016 2130   APPEARANCEUR CLOUDY (A) 10/31/2016 2130   LABSPEC 1.027 10/31/2016 2130   PHURINE 6.0 10/31/2016 2130   GLUCOSEU >=500 (A)  10/31/2016 2130   HGBUR LARGE (A) 10/31/2016 2130   BILIRUBINUR NEGATIVE 10/31/2016 2130   [place] NEGATIVE 10/31/2016 2130   PROTEINUR 100 (A) 10/31/2016 2130   UROBILINOGEN 0.2 04/09/2014 1157   NITRITE NEGATIVE 10/31/2016 2130   LEUKOCYTESUR NEGATIVE 10/31/2016 2130   Sepsis Labs: @LABRCNTIP (procalcitonin:4,lacticacidven:4)  )No results found for this or any previous visit (from the past 240 hour(s)).       Radiology Studies: No results found.      Scheduled Meds: . amoxicillin-clavulanate  1 tablet Oral Q12H  . buPROPion  100 mg Oral BID  . chlorhexidine  15 mL Mouth Rinse BID  . cholestyramine  4 g Oral QODAY  . enoxaparin (LOVENOX) injection  70 mg Subcutaneous Q24H  . feeding supplement (PRO-STAT SUGAR FREE 64)  30 mL Oral BID  . furosemide  40 mg Intravenous Daily  . gabapentin  600 mg Oral BID  . Gerhardt's butt cream   Topical Daily  . insulin aspart  0-20 Units Subcutaneous TID WC  . insulin aspart  0-5 Units Subcutaneous QHS  . insulin aspart  8 Units Subcutaneous TID WC  . insulin glargine  40 Units Subcutaneous QHS  . ipratropium-albuterol  3 mL Nebulization Q6H  .  levothyroxine  75 mcg Oral QAC breakfast  . mouth rinse  15 mL Mouth Rinse q12n4p  . metoprolol tartrate  12.5 mg Oral BID  . pantoprazole  40 mg Oral BID AC  . predniSONE  40 mg Oral Q breakfast  . Racepinephrine HCl  0.5 mL Nebulization Once   Continuous Infusions:    LOS: 32 days    Time spent: 25 minutes    Edwin Dada, MD [facility] 12/22/2017, 11:30 AM    Pager [phone] --- please page though AMION:  [email or web address] If 7PM-7AM, please contact night-coverage

## 2017-12-22 NOTE — Progress Notes (Signed)
*  PRELIMINARY RESULTS* Echocardiogram 2D Echocardiogram has been performed.  Leavy Cella 12/22/2017, 10:21 AM

## 2017-12-22 NOTE — Progress Notes (Signed)
RT Note : Rt was told in report that resistance is being met when attempting suctioning even with the smallest suction catheter. This morning I came to see the patient who required suctioning. Suctioning was done and a moderate amount of white secretions were suctioned but resistance is present. I did not push past the resistance. Rt was able to help her clear her secretions and she did tolerate suctioning well. Rt notified the Day Shift RN about the resistance and she is paging ENT to make them aware of it. Patient does not appear to be in any acute distress, she is maintaining her oxygen saturation with no issues, and she is able to cough up her secretions. She does have a slight squeaking sound coming from the trach but her lungs are clear and diminished. She is able to move air in and out appropriately and speak very well. Rt will continue to monitor and assist as needed.

## 2017-12-22 NOTE — Progress Notes (Signed)
RT Note: Heather, RN called RT to notify us that the attending physician wants the patients trach to be changed to a #6 cuffless shiley. Due to the extent of issues related to the patients larynx that required ENT to change her last trach in the operating room, RT does not feel it is in the best interest of the patient, from a safety standpoint, for respiratory therapy to perform the trach change. I have spoken with one of our clinical specialist as well as our Supervisor, who is our charge therapist today and they also agree and do not feel comfortable performing the procedure. I called and spoke to [employer], RN and notified her and she has relayed the information to the attending physician. Rt will continue to assist as needed and perform respiratory therapy for the patient within our scope of practice.

## 2017-12-22 NOTE — Progress Notes (Signed)
Assumed care from [place]. Patient stable and oriented Sitter at bedside.

## 2017-12-23 DIAGNOSIS — R4587 Impulsiveness: Secondary | ICD-10-CM

## 2017-12-23 DIAGNOSIS — F331 Major depressive disorder, recurrent, moderate: Secondary | ICD-10-CM

## 2017-12-23 DIAGNOSIS — Z930 Tracheostomy status: Secondary | ICD-10-CM

## 2017-12-23 LAB — GLUCOSE, CAPILLARY
GLUCOSE-CAPILLARY: 256 mg/dL — AB (ref 65–99)
Glucose-Capillary: 258 mg/dL — ABNORMAL HIGH (ref 65–99)
Glucose-Capillary: 289 mg/dL — ABNORMAL HIGH (ref 65–99)
Glucose-Capillary: 242 mg/dL — ABNORMAL HIGH (ref 65–99)
Glucose-Capillary: 229 mg/dL — ABNORMAL HIGH (ref 65–99)

## 2017-12-23 LAB — BASIC METABOLIC PANEL WITH GFR
Anion gap: 10 (ref 5–15)
BUN: 15 mg/dL (ref 6–20)
CO2: 26 mmol/L (ref 22–32)
Calcium: 9.4 mg/dL (ref 8.9–10.3)
Chloride: 103 mmol/L (ref 101–111)
Creatinine, Ser: 0.8 mg/dL (ref 0.44–1.00)
GFR calc Af Amer: >60 mL/min
GFR calc non Af Amer: >60 mL/min
Glucose, Bld: 242 mg/dL — ABNORMAL HIGH (ref 65–99)
Potassium: 5.1 mmol/L (ref 3.5–5.1)
Sodium: 139 mmol/L (ref 135–145)

## 2017-12-23 MED ORDER — LIDOCAINE-EPINEPHRINE (PF) 1 %-1:200000 IJ SOLN
0.0000 mL | Freq: Once | INTRAMUSCULAR | Status: DC | PRN
  Filled 2017-12-23: qty 30

## 2017-12-23 MED ORDER — LIDOCAINE HCL 2 % EX GEL
1.0000 "application " | Freq: Once | CUTANEOUS | Status: DC | PRN
  Filled 2017-12-23: qty 5

## 2017-12-23 MED ORDER — SILVER NITRATE-POT NITRATE 75-25 % EX MISC
1.0000 | Freq: Once | CUTANEOUS | Status: DC | PRN
  Filled 2017-12-23: qty 1

## 2017-12-23 MED ORDER — INSULIN GLARGINE 100 UNIT/ML ~~LOC~~ SOLN
25.0000 [IU] | Freq: Two times a day (BID) | SUBCUTANEOUS | Status: DC
  Administered 2017-12-23: 25 [IU] via SUBCUTANEOUS
  Filled 2017-12-23 (×2): qty 0.25

## 2017-12-23 MED ORDER — OXYMETAZOLINE HCL 0.05 % NA SOLN
1.0000 | Freq: Once | NASAL | Status: DC | PRN
  Filled 2017-12-23: qty 15

## 2017-12-23 MED ORDER — INSULIN GLARGINE 100 UNIT/ML ~~LOC~~ SOLN
40.0000 [IU] | Freq: Every day | SUBCUTANEOUS | Status: DC
  Administered 2017-12-24 – 2017-12-27 (×4): 40 [IU] via SUBCUTANEOUS
  Filled 2017-12-23 (×4): qty 0.4

## 2017-12-23 MED ORDER — LIDOCAINE HCL 4 % EX SOLN
0.0000 mL | Freq: Once | CUTANEOUS | Status: DC | PRN
  Filled 2017-12-23: qty 50

## 2017-12-23 MED ORDER — FUROSEMIDE 20 MG PO TABS
20.0000 mg | ORAL_TABLET | Freq: Every day | ORAL | Status: DC
  Administered 2017-12-23: 20 mg via ORAL
  Filled 2017-12-23: qty 1

## 2017-12-23 MED ORDER — TRIPLE ANTIBIOTIC 3.5-400-5000 EX OINT
1.0000 "application " | TOPICAL_OINTMENT | Freq: Once | CUTANEOUS | Status: DC | PRN
  Filled 2017-12-23: qty 1

## 2017-12-23 NOTE — Care Management Note (Signed)
Case Management Note  Patient Details  Name: [name] MRN: [id number] Date of Birth: 04/07/1976  Subjective/Objective:                    Action/Plan: Plan continues to be [facility] when trach stable for their medical unit. CM following.   Expected Discharge Date:                  [facility]  [facility]  In-House Referral:     Discharge planning Services  CM Consult  Post Acute Care Choice:    Choice offered to:     DME Arranged:    DME Agency:     HH Arranged:    [place] Agency:     Status of Service:  In process, will continue to follow  If discussed at Long Length of Stay Meetings, dates discussed:    Additional Comments:  Pollie Friar, RN 12/23/2017, 9:05 AM

## 2017-12-23 NOTE — Procedures (Signed)
Tracheostomy Change Note  Patient Details:   Name: [name] DOB: 1976-04-01 MRN: [id number]    Airway Documentation: AIRWAYS (Active)     AIRWAYS (Active)    Dr. Erik Obey and Dr. Constance Holster at bedside to change trach from #6 portex to #6 shiley XLT proximal.    Evaluation  O2 sats: stable throughout Complications: No apparent complications Patient did tolerate procedure well. Bilateral Breath Sounds: Diminished(upper airway stridor.)    Elwin Mocha 12/23/2017, 1:56 PM

## 2017-12-23 NOTE — Consult Note (Signed)
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 Patient reports improved movement of air.  Measurement:  2 to 4:00, 2 cm x 3.2 cm devitalized tissue, 9 to 11:00, 2 cm x 2 cm devitalized tissue 6:00 1 cm diameter necrotic tissue Wound ONG:[id number], devitalized Drainage (amount, consistency, odor) moderate purulence Periwound: intact  Exposed to drainage frequently.   Dressing procedure/placement/frequency:Frequent trach care to keep skin clean and dry.  Continue silver hydrofiber as ordered.  Weekly assessments  Please re-consult if needed.  Domenic Moras RN BSN [place] Pager [phone]

## 2017-12-23 NOTE — Progress Notes (Signed)
Dr. Loleta Books requested pt to be placed on RA humidified during day and o2 at night. Placed pt on RA humidified for pt secretions.

## 2017-12-23 NOTE — Progress Notes (Signed)
PROGRESS NOTE    [name]  PYP:[id number] DOB: 04/29/1976 DOA: 11/20/2017 PCP: Sandi Mariscal, MD      Brief Narrative:  [name] is a 42 y.o. female with a history of morbid obesity (BMI 29), IDDM, OSA, HTN, Cushing's disease and depression who presented 3/13 after suicide attempt by intentional overdose of baclofen, trazodone, and tramadol. She reportedly was encephalopathic and experienced a seizure, intubated for airway protection and admitted to the ICU. After extubation she developed stridor initially responsive to racemic epinephrine with laryngoscopy revealing fixed vocal cords, though when this recurred 3/21 it was unresponsive necessitating tracheostomy with laryngoscopy revealing circumferential subglottic swelling with necrotic debris and fibrinous exudate. Respiratory status has stabilized, though she continued to have significant secretions with odor. Clindamycin was started 3/27 and ENT reevaluated the patient 4/2 due to CT showing left paralaryngeal abscess. Flexible laryngoscopy was repeated showing pooled secretions in pharynx, and inability to assess vocal cord mobility or subglottic patency due to poor laryngeal airway. Psychiatry has continued to recommend transfer to inpatient psychiatry for suicidal ideation and attempt when medically stable.     Assessment & Plan:  Acute hypoxic respiratory failure due to trazodone, baclofen, tramadol overdose Tracheostomy wound infection Subglottic stenosis -Will need ENT follow-up as an outpatient in 3-6 months for consideration of operative treatment of her subglottic stenosis   Tracheostomy in place Trach plugged over the weekend, more stridorous.  At no point hypoxic.  Today, ENT replaced the trach at the bedside with a trach tube more suited to long-term outpatietn use.   -Consult to ENT, appreciate cares -Consult to Pulmonology, trach team, appreciate assistance  Dyspnea with exertion This was from upper airway  plugging.  Now resolved.  CXR clear, personally reviewed.  Echo normal EF, no RWMA.    Intentional overdose Suicidal ideation Depression -Continue bupropion -Consult Psychiatry, appreciate cares  Acute kidney injury Resolved.  Type 2 diabetes, with hyperglycemia, insulin-dependent -Continue Lantus -Continue SSI  Seizure disorder -Continue gabapentin  Hypothyroidism -Continue levothyroxine  Morbid obesity -Continue pro-stat supplement  Cushing's disease  Hypertension Blood pressure at goal -Continue metoprolol, Lasix  Normocytic anemia Etiology from acute illness.  Stable.  Hyperkalemia Resolved  Other medications -Continue pantoprazole, cholestyramine  OSA Previously on CPAP.  Desats here overnight.  Morning ABG without hypercapnia. -Continue supplemental oxygen at night      DVT prophylaxis: Lovenox Code Status: FULL Family Communication: None present MDM and disposition Plan: The below labs and imaging reports were reviewed, summarized above.  The patient presented with severe baclofen, trazodone, tramadol overdose, required intubation, complicated by subglottic infection and stenosis, now with tracheostomy in place, likely long-term tracheostomy.  Infection now resolved, on supplemental oxygen at night only.  Had trach tube replaced today, stridor and dyspnea completely resolved.  Her trach site has minimal granulation tissue, which is nearly resolved, WOC will train patient to manage this by herself.         Consultants:   ENT  Psychiatry  Procedures:   Laryngoscopy 4/10  PROCEDURE DETAILS: The patient was taken to the operating room and placed on the operating table in the supine position. Following induction of general endotracheal anesthesia, the table was turned 90 degrees.  A maxillary tooth protector was used throughout the case.  A Jako laryngoscope was entered into the oral cavity used to inspect the larynx.  The cords were swollen,  the arytenoids were edematous as well.  The subglottic larynx revealed irregular granular swollen tissue with some fibrinous exudate, not  as necrotic appearing weeks ago but still with significant obstruction.  I was able to downsize the tracheostomy to a 6 mm cuffed adjustable length trach tube.  The previous tracheostomy tube was partially occluded so that was removed in order to facilitate ventilation during the case.  Patient was then awakened and transferred to recovery in stable condition.  PATIENT DISPOSITION:  To PACU, stable  Addendum: Of note, the wound at the inferior aspect of the tracheostomy site was granulating and did not appear to be infected at all.  I placed a moist and 4 x 4 in this wound and kept most of it hanging out for safety reasons.  The tracheostomy tube can be deflated as soon as she is awake enough.  She may resume her diet.  She is stable for transfer to whichever facility is going to be appropriate.  She will likely require the tracheostomy for the foreseeable future, as she is going to be very slow to heal this issue.   LE [place] 4/7 Final Interpretation: Right: Portions of this examination were limited- see technologist comments above. No cystic structure found in the popliteal fossa. Techniquely limited due to patient body habitus and critical condition. Therefore, this study is inconclusive. Left: Portions of this examination were limited- see technologist comments above. No cystic structure found in the popliteal fossa. Techniquely limited due to patient body habitus and critical condition. Therefore, this study is inconclusive.     Antimicrobials:   Clindamycin  Zosyn   Augmentin   Subjective: Stridor and SOB with ambulation, somewhat as well with rest.  No fever, cough, sputum.  No draniage from trach site.  No wheezing.  Objective: Vitals:   12/23/17 0059 12/23/17 0326 12/23/17 0436 12/23/17 0437  BP:   136/68   Pulse: 88 90 94   Resp: 20 19 19      Temp:   97.8 F (36.6 C)   TempSrc:   Oral   SpO2: 96% 97% 98%   Weight:    (!) 146.4 kg (322 lb 12.1 oz)  Height:        Intake/Output Summary (Last 24 hours) at 12/23/2017 0727 Last data filed at 12/22/2017 1730 Gross per 24 hour  Intake 720 ml  Output -  Net 720 ml   Filed Weights   12/19/17 0650 12/22/17 0400 12/23/17 0437  Weight: (!) 145.7 kg (321 lb 3.4 oz) (!) 147.6 kg (325 lb 6.4 oz) (!) 146.4 kg (322 lb 12.1 oz)    Examination: General appearance: Obese adult female, sitting in chair. No acute distress. HEENT: Anicteric, conjunctival pink, lids and lashes normal, oropharynx normal, Mallampati 4, no oral lesions, hearing normal, trach has minimal granulation tissue around it, no drainage, redness, or induration. Skin: Skin is warm and dry, no jaundice, no redness or swelling or drainage from her trach site or near her neck. Cardiac: Regular rate and rhythm, no murmurs, nonpitting lower extremity edema. Respiratory: After trach change, her breathing is unlabored, without stridor.  No rales or wheezes.  Breath sounds are diminished throughout. abdomen: Abdomen soft.  No TTP. No ascites, distension, hepatosplenomegaly.   MSK: No deformity or effusions in the large joints of the upper or lower extremities bilaterally. Neuro: Speech fluent around her Passy-Muir valve, awake and alert, oriented to person place and time, moves all extremities equally with normal strength. Psych: Attention normal, affect full range, but equally expresses frustration, depression, sensorium intact, oriented to person, place, and time.  Judgment and insight appear normal.  Data Reviewed: I have personally reviewed following labs and imaging studies:  CBC: Recent Labs  Lab 12/18/17 0532  WBC 4.7  HGB 9.6*  HCT 31.2*  MCV 92.3  PLT 712   Basic Metabolic Panel: Recent Labs  Lab 12/18/17 0532 12/22/17 0259 12/23/17 0334  NA 140 138 139  K 4.4 4.7 5.1  CL 101 103 103  CO2 29  26 26   GLUCOSE 180* 164* 242*  BUN 11 14 15   CREATININE 0.88 0.85 0.80  CALCIUM 9.0 9.2 9.4   GFR: Estimated Creatinine Clearance: 126.8 mL/min (by C-G formula based on SCr of 0.8 mg/dL). Liver Function Tests: No results for input(s): AST, ALT, ALKPHOS, BILITOT, PROT, ALBUMIN in the last 168 hours. No results for input(s): LIPASE, AMYLASE in the last 168 hours. No results for input(s): AMMONIA in the last 168 hours. Coagulation Profile: No results for input(s): INR, PROTIME in the last 168 hours. Cardiac Enzymes: No results for input(s): CKTOTAL, CKMB, CKMBINDEX, TROPONINI in the last 168 hours. BNP (last 3 results) No results for input(s): PROBNP in the last 8760 hours. HbA1C: No results for input(s): HGBA1C in the last 72 hours. CBG: Recent Labs  Lab 12/22/17 0601 12/22/17 1150 12/22/17 1621 12/22/17 2135 12/23/17 0635  GLUCAP 156* 106* 175* 171* 258*   Lipid Profile: No results for input(s): CHOL, HDL, LDLCALC, TRIG, CHOLHDL, LDLDIRECT in the last 72 hours. Thyroid Function Tests: No results for input(s): TSH, T4TOTAL, FREET4, T3FREE, THYROIDAB in the last 72 hours. Anemia Panel: No results for input(s): VITAMINB12, FOLATE, FERRITIN, TIBC, IRON, RETICCTPCT in the last 72 hours. Urine analysis:    Component Value Date/Time   COLORURINE RED (A) 10/31/2016 2130   APPEARANCEUR CLOUDY (A) 10/31/2016 2130   LABSPEC 1.027 10/31/2016 2130   PHURINE 6.0 10/31/2016 2130   GLUCOSEU >=500 (A) 10/31/2016 2130   HGBUR LARGE (A) 10/31/2016 2130   BILIRUBINUR NEGATIVE 10/31/2016 2130   [place] NEGATIVE 10/31/2016 2130   PROTEINUR 100 (A) 10/31/2016 2130   UROBILINOGEN 0.2 04/09/2014 1157   NITRITE NEGATIVE 10/31/2016 2130   LEUKOCYTESUR NEGATIVE 10/31/2016 2130   Sepsis Labs: @LABRCNTIP (procalcitonin:4,lacticacidven:4)  )No results found for this or any previous visit (from the past 240 hour(s)).       Radiology Studies: Dg Chest 2 View  Result Date:  12/22/2017 CLINICAL DATA:  Shortness of breath. EXAM: CHEST - 2 VIEW COMPARISON:  Chest x-ray dated December 09, 2017. FINDINGS: Unchanged tracheostomy tube. Stable cardiomediastinal silhouette. Normal pulmonary vascularity. No focal consolidation, pleural effusion, or pneumothorax. No acute osseous abnormality. IMPRESSION: No active cardiopulmonary disease. Electronically Signed   By: Titus Dubin M.D.   On: 12/22/2017 17:26        Scheduled Meds: . buPROPion  100 mg Oral BID  . chlorhexidine  15 mL Mouth Rinse BID  . cholestyramine  4 g Oral QODAY  . enoxaparin (LOVENOX) injection  70 mg Subcutaneous Q24H  . feeding supplement (PRO-STAT SUGAR FREE 64)  30 mL Oral BID  . furosemide  40 mg Intravenous Daily  . gabapentin  600 mg Oral BID  . Gerhardt's butt cream   Topical Daily  . insulin aspart  0-20 Units Subcutaneous TID WC  . insulin aspart  0-5 Units Subcutaneous QHS  . insulin aspart  8 Units Subcutaneous TID WC  . insulin glargine  40 Units Subcutaneous QHS  . ipratropium-albuterol  3 mL Nebulization Q6H  . levothyroxine  75 mcg Oral QAC breakfast  . mouth rinse  15 mL Mouth Rinse  [id number]  . metoprolol tartrate  12.5 mg Oral BID  . pantoprazole  40 mg Oral BID AC  . predniSONE  40 mg Oral Q breakfast  . Racepinephrine HCl  0.5 mL Nebulization Once   Continuous Infusions:    LOS: 33 days    Time spent:25 minutes    Edwin Dada, MD [facility] 12/23/2017, 2:30 PM    Pager [phone] --- please page though AMION:  [email or web address] If 7PM-7AM, please contact night-coverage

## 2017-12-23 NOTE — Progress Notes (Signed)
12/23/2017 1:01 PM  [name], [name] [id number]      Temp:  [97.8 F (36.6 C)-98.3 F (36.8 C)] 97.8 F (36.6 C) (04/15 0832) Pulse Rate:  [81-102] 94 (04/15 1232) Resp:  [17-22] 22 (04/15 1232) BP: (112-147)/(59-77) 138/63 (04/15 1232) SpO2:  [93 %-100 %] 94 % (04/15 1232) FiO2 (%):  [21 %-28 %] 21 % (04/15 1141) Weight:  [146.4 kg (322 lb 12.1 oz)] 146.4 kg (322 lb 12.1 oz) (04/15 0437),     Intake/Output Summary (Last 24 hours) at 12/23/2017 1301 Last data filed at 12/23/2017 0827 Gross per 24 hour  Intake 840 ml  Output -  Net 840 ml    Results for orders placed or performed during the hospital encounter of 11/20/17 (from the past 24 hour(s))  Glucose, capillary     Status: Abnormal   Collection Time: 12/22/17  4:21 PM  Result Value Ref Range   Glucose-Capillary 175 (H) 65 - 99 mg/dL   Comment 1 Notify RN    Comment 2 Document in Chart   Glucose, capillary     Status: Abnormal   Collection Time: 12/22/17  9:35 PM  Result Value Ref Range   Glucose-Capillary 171 (H) 65 - 99 mg/dL  Basic metabolic panel     Status: Abnormal   Collection Time: 12/23/17  3:34 AM  Result Value Ref Range   Sodium 139 135 - 145 mmol/L   Potassium 5.1 3.5 - 5.1 mmol/L   Chloride 103 101 - 111 mmol/L   CO2 26 22 - 32 mmol/L   Glucose, Bld 242 (H) 65 - 99 mg/dL   BUN 15 6 - 20 mg/dL   Creatinine, Ser 0.80 0.44 - 1.00 mg/dL   Calcium 9.4 8.9 - 10.3 mg/dL   GFR calc non Af Amer >60 >60 mL/min   GFR calc Af Amer >60 >60 mL/min   Anion gap 10 5 - 15  Glucose, capillary     Status: Abnormal   Collection Time: 12/23/17  6:35 AM  Result Value Ref Range   Glucose-Capillary 258 (H) 65 - 99 mg/dL  Glucose, capillary     Status: Abnormal   Collection Time: 12/23/17  9:02 AM  Result Value Ref Range   Glucose-Capillary 289 (H) 65 - 99 mg/dL   Comment 1 Notify RN    Comment 2 Document in Chart   Glucose, capillary     Status: Abnormal   Collection Time: 12/23/17 11:41 AM  Result Value Ref Range    Glucose-Capillary 242 (H) 65 - 99 mg/dL   Comment 1 Notify RN    Comment 2 Document in Chart     SUBJECTIVE:  Nurses noted could not pass suction catheter beginning yesterday.  Husband noted able to talk without occluding tube.  Dr. Constance Holster reports prior episode of tube plugging with immediate relief upon tube change.  OBJECTIVE:  Mild resp distress/stridor.  Mod anxiety.    IMPRESSION:    Trach tube blockage vs dislodgement.    PLAN:    Exchanged for #6 cuffless extended length (proximal) trach tube without difficulty.  Pt notes immediate improvement in breathing.    Now there is an inner cannula which can be exchanged to prevent future plugging.  OK to continue PMV usage.   [name]

## 2017-12-23 NOTE — Progress Notes (Signed)
PULMONARY / CRITICAL CARE MEDICINE   Name: [name] MRN: [id number] DOB: [birth year]    ADMISSION DATE:  11/20/2017 CONSULTATION DATE:  3/13  REFERRING MD:  Dr. Christy Gentles  CHIEF COMPLAINT:  Encephalopathy/Overdose (intentional)  HISTORY OF PRESENT ILLNESS:   42 year old female with PMH as below, which is significant for cushing's disease, DM, depression, HTN, morbid obesity, and OSA. Within the past few days she has had a complicated family situation at home, which has caused a lot of distress. Otherwise her health has been in its usual state. In the evening hours of 3/12 she contacted her husband while he was working to inform him she was going to overdose. He went home and she had already taken approximately #15 10mg  Baclofen and 100mg  trazadone. She then went on to take "at least #30" 50mg  tramadol. She would not initially come to the ED. Her husband called poison control and watched her. When she became encephalopathic and tremulous she agreed to come to ED. She suffered a generalized seizure in triage and was brought back to ED where she was emergently intubated. PCCM asked to admit.   SUBJECTIVE:  Much better after trach change   VITAL SIGNS: Blood Pressure 138/63 (BP Location: Left Arm)   Pulse 94   Temperature 97.8 F (36.6 C) (Oral)   Respiration (Abnormal) 22   Height 5\' 2"  (1.575 m)   Weight (Abnormal) 322 lb 12.1 oz (146.4 kg)   Oxygen Saturation 94%   Body Mass Index 59.03 kg/m   HEMODYNAMICS:    VENTILATOR SETTINGS: FiO2 (%):  [21 %-28 %] 21 %  INTAKE / OUTPUT: I/O last 3 completed shifts: In: 80 [P.O.:720] Out: -   PHYSICAL EXAMINATION: General 42 year old obese female up and ambulating in hall. No acute distress.  HENT now w/ size 6 prox XLT. Excellent phonation w/ PMV placed. Neck is large.  Pulm: Clear, decreased in bases.  Card RRR abd obese soft NT  Ext brisk CR warm and dry  Neuro awake and oriented LABS:  BMET Recent Labs  Lab  12/18/17 0532 12/22/17 0259 12/23/17 0334  NA 140 138 139  K 4.4 4.7 5.1  CL 101 103 103  CO2 29 26 26   BUN 11 14 15   CREATININE 0.88 0.85 0.80  GLUCOSE 180* 164* 242*   Electrolytes Recent Labs  Lab 12/18/17 0532 12/22/17 0259 12/23/17 0334  CALCIUM 9.0 9.2 9.4   CBC Recent Labs  Lab 12/18/17 0532  WBC 4.7  HGB 9.6*  HCT 31.2*  PLT 314   Coag's No results for input(s): APTT, INR in the last 168 hours.  Sepsis Markers No results for input(s): LATICACIDVEN, PROCALCITON, O2SATVEN in the last 168 hours.  ABG Recent Labs  Lab 12/20/17 0421  PHART 7.420  PCO2ART 43.3  PO2ART 103   Liver Enzymes No results for input(s): AST, ALT, ALKPHOS, BILITOT, ALBUMIN in the last 168 hours.  Cardiac Enzymes No results for input(s): TROPONINI, PROBNP in the last 168 hours. Glucose Recent Labs  Lab 12/22/17 1150 12/22/17 1621 12/22/17 2135 12/23/17 0635 12/23/17 0902 12/23/17 1141  GLUCAP 106* 175* 171* 258* 289* 242*   Imaging I reviewed CXR myself, mild pulmonary edema noted.  STUDIES:  CT head 3/13 > unremarkable  CULTURES:   ANTIBIOTICS:   SIGNIFICANT EVENTS: 3/13 admit after intentional overdose of baclofen and tramadol.  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  -f/u ENT eval was negative for laryngeal abscess  on 4/2 -repeat DL 4/10:  The cords were swollen, the arytenoids were edematous as well.  The subglottic larynx revealed irregular granular swollen tissue with some fibrinous exudate, not as necrotic appearing weeks ago but still with significant obstruction.  Changed to # 6 adjustable portex.  -4/15 called by medicine.the patient w/ stridor while walking and talking. Desaturating. Dr Erik Obey changed trach to prox shiley XLT. Portex was  occluded.  Following change she ambulated in hall on room air w/out desaturation. Says "I feel much better w/ this trach". Also can now tolerate PMV.  Plan/rec Will ask trach team to see and begin trach care education Will see her in trach clinic for routine changes BUT she must see ENT before any consideration for decannulation given her subglottic stenosis.   I would not be surprised if her trach ends up being life long.   Erick Colace ACNP-BC [facility] Pager # [phone] OR # [phone] if no answer

## 2017-12-23 NOTE — Progress Notes (Signed)
RT received call from Dr. Loleta Books requesting trach change from #6 portex to #6 Shiley XLT for pt comfort and better communication. RT explained reasoning behind asking for ENT to change trach as pt has history of difficult airway and stridor. Dr. Loleta Books gave Dr. Constance Holster RT number to further discuss care. Dr. Constance Holster requested over the phone to have RT change pt to #6 shiley XLT cuffless distal. Dr. Constance Holster stated that when he changed pt trach on 12/18/2017 that he didn't have any difficulty and that he would be available if we encountered any problems during procedure.

## 2017-12-23 NOTE — Progress Notes (Signed)
Patient placed on T/C for the night for humidification purposes, tolerating well, no distress noted.

## 2017-12-23 NOTE — Progress Notes (Signed)
Physical Therapy Treatment Patient Details Name: [name] MRN: [id number] DOB: 1976-08-23 Today's Date: 12/23/2017    History of Present Illness Patient is a 42 y/o female who presents with drug OD and Sz. Intubated 3/13-3/15. resp failure with emergent trach 3/21 due to vocal cord dysfunction. CT of throat- suggests left paralaryngeal abscess. s/p Laryngoscopy 4/10. PMH includes ORIF RLE, depression, anxiety, morbid obesity, DM, Cushing's, Rt uni knee replacement, HTN, left ankle surgery, dyslipidemia, hypothyroidism. Underwent larangyscopy with trach downsized to 87mm. 4/10.    PT Comments    Pt progressing towards functional mobility goals and appears to be in positive, hopeful mood. Pt ambulated min guard this session without AD. Continues to have tachycardia with HR up to 131 bpm at its highest but able to quickly normalize with standing rest break. DOE 2-3/4. SpO2 92% or greater on RA. Encouraged continued short bouts of ambulation with sitter. Current plan remains appropriate. Will continue to follow acutely and progress as tolerated.    Follow Up Recommendations  Supervision for mobility/OOB;Other (comment)(plan for inpatient psych)     Equipment Recommendations  None recommended by PT    Recommendations for Other Services       Precautions / Restrictions Precautions Precautions: Other (comment)(suicide-pt has sitter) Precaution Comments: trach Restrictions Weight Bearing Restrictions: No    Mobility  Bed Mobility               General bed mobility comments: Pt was OOB in the recliner chair upon PT arrival  Transfers Overall transfer level: Needs assistance Equipment used: None Transfers: Sit to/from Stand Sit to Stand: Min guard         General transfer comment: min guard for safety due to mild instability  Ambulation/Gait Ambulation/Gait assistance: Min guard Ambulation Distance (Feet): 500 Feet(4 standing rest breaks intermittently throughout  walk) Assistive device: None Gait Pattern/deviations: Step-through pattern;Wide base of support;Drifts right/left Gait velocity: decreased   General Gait Details: slow, mildly unsteady waddling gait with increased lateral sway. HR 112-131. DOE 2/4 occasionally 3/4. SpO2 >92% on RA throughout.    Stairs             Wheelchair Mobility    Modified Rankin (Stroke Patients Only)       Balance Overall balance assessment: Needs assistance Sitting-balance support: Feet supported;No upper extremity supported Sitting balance-Leahy Scale: Good     Standing balance support: During functional activity Standing balance-Leahy Scale: Fair Standing balance comment: able to ambulate without UE support but mildly unsteady                            Cognition Arousal/Alertness: Awake/alert Behavior During Therapy: WFL for tasks assessed/performed Overall Cognitive Status: Within Functional Limits for tasks assessed                                        Exercises      General Comments General comments (skin integrity, edema, etc.): pt states it is helpful to have target to reach when walking      Pertinent Vitals/Pain Pain Assessment: Faces Faces Pain Scale: Hurts a little bit Pain Location: trach site, low back pain Pain Descriptors / Indicators: Discomfort;Aching Pain Intervention(s): Limited activity within patient's tolerance;Monitored during session;Repositioned    Home Living  Prior Function            PT Goals (current goals can now be found in the care plan section) Acute Rehab PT Goals Patient Stated Goal: to go home PT Goal Formulation: With patient Time For Goal Achievement: 01/02/18 Potential to Achieve Goals: Good Progress towards PT goals: Progressing toward goals    Frequency    Min 3X/week      PT Plan Current plan remains appropriate    Co-evaluation              AM-PAC PT "6  Clicks" Daily Activity  Outcome Measure  Difficulty turning over in bed (including adjusting bedclothes, sheets and blankets)?: None Difficulty moving from lying on back to sitting on the side of the bed? : None Difficulty sitting down on and standing up from a chair with arms (e.g., wheelchair, bedside commode, etc,.)?: None Help needed moving to and from a bed to chair (including a wheelchair)?: None Help needed walking in hospital room?: A Little Help needed climbing 3-5 steps with a railing? : A Little 6 Click Score: 22    End of Session Equipment Utilized During Treatment: Gait belt Activity Tolerance: Patient tolerated treatment well Patient left: in chair;with call bell/phone within reach;with nursing/sitter in room Nurse Communication: Mobility status PT Visit Diagnosis: Muscle weakness (generalized) (M62.81);Unsteadiness on feet (R26.81);Difficulty in walking, not elsewhere classified (R26.2)     Time: 1610-9604 PT Time Calculation (min) (ACUTE ONLY): 21 min  Charges:  $Gait Training: 8-22 mins                    G Codes:       Vic Ripper, SPT   Vic Ripper 12/23/2017, 4:55 PM

## 2017-12-23 NOTE — Consult Note (Signed)
[facility] Face-to-Face Psychiatry Consult   Reason for Consult:  Overdose  Referring Physician:  EDP Patient Identification: [name] MRN:  [id number] Principal Diagnosis: MDD (major depressive disorder), recurrent episode, moderate ([place]) Diagnosis:   Patient Active Problem List   Diagnosis Date Noted  . Major depressive disorder, recurrent episode, severe ([place]) [F33.2]     Priority: High  . MDD (major depressive disorder), recurrent episode, moderate ([place]) [F33.1]   . Acute respiratory failure with hypoxemia ([place]) [J96.01]   . SOB (shortness of breath) [R06.02]   . 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 Hoarse voice quality [R49.0]   . Hypomagnesemia [E83.42]   . Shortness of breath [R06.02]   . Intentional overdose of drug in tablet form ([place]) [T50.902A] 11/20/2017  . Intentional drug overdose ([place]) [T50.902A]   . Seizure ([place]) [R56.9]   . Acute respiratory failure with hypercapnia ([place]) [J96.02]   . Status epilepticus ([place]) [G40.901]   . 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 Abdominal pain, epigastric [R10.13]   . LUQ abdominal pain [R10.12]   . Nausea and vomiting [R11.2]   . Gastroparesis [K31.84]   . Hypertriglyceridemia [E78.1] 07/22/2015  . Diabetes mellitus due to underlying condition, uncontrolled, with diabetic neuropathy, with long-term current use of insulin (HCC) [E08.40, E08.65, Z79.4]   . Vitamin D deficiency [E55.9] 07/19/2015  . 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 Osteoporosis [M81.0] 07/19/2015  . Periprosthetic fracture 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[place]), R tibial plateau  [M97.9XXA] 07/18/2015  . Fracture, tibial plateau [S82.143A] 07/16/2015  . Uncontrolled diabetes mellitus with diabetic neuropathy, with long-term current use of insulin ([place]) [E11.40, Z79.4, E11.65] 07/16/2015  . Leukocytosis [D72.829] 07/16/2015  . Essential hypertension [I10]  07/16/2015  . Morbid obesity ([place]) [E66.01] 04/20/2014  . Hyperlipidemia [E78.5] 07/31/2012  . Cushing disease ([place]) [E24.0] 07/31/2012  . Hypothyroid [E03.9] 07/31/2012    Total Time spent with patient: 45 minutes  Subjective:   [name] is a 42 y.o. female patient admitted with suicide attempt.  HPI:  42 yo female who was admitted after a severe overdose in a suicide attempt.  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  Tearful on assessment and worried about losing her 50 and 14 yo children, one has autism with special needs.  Continues to endorse feelings of hopelessness, helplessness, worthlessness, and suicidal ideations.  No homicidal ideations or substance abuse.  Difficult adjusting to her trach and new set of disabilities.  Her husband believes she remains a threat to herself.  Past Psychiatric History: depression  Risk to Self: Is patient at risk for suicide?: Yes Risk to Others:  no Prior Inpatient Therapy:  no Prior Outpatient Therapy:  yes  Past Medical History:  Past Medical History:  Diagnosis Date  . Anxiety   . Complication of anesthesia    Pt. states takes long time to wake up from it.   . Cushing's disease ([place])   . Depression   . Diabetes ([place])   . Hyperlipidemia   . Hyperlipidemia   . Hypertension   . Morbid obesity ([employer])   . Osteoporosis 07/19/2015  . Periprosthetic fracture 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[place]), R tibial plateau  07/18/2015  . Sleep apnea   . Uncontrolled diabetes mellitus with diabetic neuropathy, with long-term current use of insulin ([place]) 07/16/2015  . Vitamin D deficiency 07/19/2015    Past Surgical History:  Procedure Laterality Date  . ANTERIOR TALOFIBULAR LIGAMENT REPAIR Left  11/15/2014   Procedure: ANTERIOR TALOFIBULAR LIGAMENT REPAIR;  Surgeon: Jana Half, DPM;  Location: [place];  Service: Podiatry;  Laterality: Left;  . CESAREAN SECTION  dec 1997/  06-03-2001/   01-01-2005   BILATERAL TUBAL LIGATION WITH LAST  ONE  . DILATION AND CURETTAGE OF UTERUS  1995   WITH SUCTION  . DIRECT LARYNGOSCOPY N/A 12/18/2017   Procedure: DIRECT LARYNGOSCOPY;  Surgeon: Izora Gala, MD;  Location: [place];  Service: ENT;  Laterality: N/A;  . ESOPHAGOGASTRODUODENOSCOPY (EGD) WITH PROPOFOL N/A 10/04/2016   Procedure: ESOPHAGOGASTRODUODENOSCOPY (EGD) WITH PROPOFOL;  Surgeon: [name], MD;  Location: [facility];  Service: Endoscopy;  Laterality: N/A;  . LAPAROSCOPIC CHOLECYSTECTOMY  09-25-2005  . ORIF TIBIA PLATEAU Right 07/19/2015   Procedure: OPEN REDUCTION INTERNAL FIXATION (ORIF) RIGHT TIBIAL PLATEAU;  Surgeon: Altamese [place], MD;  Location: [place];  Service: Orthopedics;  Laterality: Right;  . PARTIAL KNEE ARTHROPLASTY Right 04/19/2014   Procedure: RIGHT UNI KNEE ARTHROPLASTY MEDIALLY ;  Surgeon: Mauri Pole, MD;  Location: [facility];  Service: Orthopedics;  Laterality: Right;  . TRACHEOSTOMY TUBE PLACEMENT N/A 11/28/2017   Procedure: TRACHEOSTOMY;  Surgeon: Izora Gala, MD;  Location: [place];  Service: ENT;  Laterality: N/A;  . TUBAL LIGATION     Family History:  Family History  Adopted: Yes  Problem Relation Age of Onset  . Autism Son   . ADD / ADHD Son   . Apraxia Son    Family Psychiatric  History: son with autism, ADHD, apraxia Social History:  Social History   Substance and Sexual Activity  Alcohol Use Yes   Comment: RARE     Social History   Substance and Sexual Activity  Drug Use No  . Types: Marijuana    Social History   Socioeconomic History  . Marital status: Married    Spouse name: [name]  . Number of children: 2  . Years of education: Not on file  . Highest education level: Not on file  Occupational History  . Not on file  Social Needs  . Financial resource strain: Not on file  . Food insecurity:    Worry: Not on file    Inability: Not on file  . Transportation needs:    Medical: Not on file    Non-medical: Not on file  Tobacco Use  . Smoking status: Current Every Day  Smoker    Years: 4.00    Types: Cigarettes  . Smokeless tobacco: Never Used  Substance and Sexual Activity  . Alcohol use: Yes    Comment: RARE  . Drug use: No    Types: Marijuana  . Sexual activity: Yes    Partners: Male    Birth control/protection: Pill  Lifestyle  . Physical activity:    Days per week: Patient refused    Minutes per session: Patient refused  . Stress: Not on file  Relationships  . Social connections:    Talks on phone: Not on file    Gets together: Not on file    Attends religious service: Not on file    Active member of club or organization: Not on file    Attends meetings of clubs or organizations: Not on file    Relationship status: Not on file  Other Topics Concern  . Not on file  Social History Narrative  . Not on file   Additional Social History:    Allergies:   Allergies  Allergen Reactions  . Penicillins Hives and Other (  See Comments)    PATIENT HAS HAD A PCN REACTION WITH IMMEDIATE RASH, FACIAL/TONGUE/THROAT SWELLING, SOB, OR LIGHTHEADEDNESS WITH HYPOTENSION:   YES ** see below HAS PT DEVELOPED SEVERE RASH INVOLVING MUCUS MEMBRANES or SKIN NECROSIS: YES ** see below Has patient had a PCN reaction that required hospitalization No Has patient had a PCN reaction occurring within the last 10 years: No  **TOLERATED ZOSYN 12/12/17    Labs:  Results for orders placed or performed during the hospital encounter of 11/20/17 (from the past 48 hour(s))  Glucose, capillary     Status: Abnormal   Collection Time: 12/21/17  7:42 PM  Result Value Ref Range   Glucose-Capillary 137 (H) 65 - 99 mg/dL   Comment 1 Document in Chart   Glucose, capillary     Status: Abnormal   Collection Time: 12/21/17  9:07 PM  Result Value Ref Range   Glucose-Capillary 139 (H) 65 - 99 mg/dL   Comment 1 Document in Chart   Basic metabolic panel     Status: Abnormal   Collection Time: 12/22/17  2:59 AM  Result Value Ref Range   Sodium 138 135 - 145 mmol/L   Potassium  4.7 3.5 - 5.1 mmol/L   Chloride 103 101 - 111 mmol/L   CO2 26 22 - 32 mmol/L   Glucose, Bld 164 (H) 65 - 99 mg/dL   BUN 14 6 - 20 mg/dL   Creatinine, Ser 0.85 0.44 - 1.00 mg/dL   Calcium 9.2 8.9 - 10.3 mg/dL   GFR calc non Af Amer >60 >60 mL/min   GFR calc Af Amer >60 >60 mL/min    Comment: (NOTE) The eGFR has been calculated using the CKD EPI equation. This calculation has not been validated in all clinical situations. eGFR's persistently <60 mL/min signify possible Chronic Kidney Disease.    Anion gap 9 5 - 15    Comment: Performed at [facility] [address], [place], [place] [address]  Brain natriuretic peptide     Status: None   Collection Time: 12/22/17  2:59 AM  Result Value Ref Range   B Natriuretic Peptide 33.5 0.0 - 100.0 pg/mL    Comment: Performed at [facility] [address], [place], [place] [address]  Glucose, capillary     Status: Abnormal   Collection Time: 12/22/17  6:01 AM  Result Value Ref Range   Glucose-Capillary 156 (H) 65 - 99 mg/dL  Glucose, capillary     Status: Abnormal   Collection Time: 12/22/17 11:50 AM  Result Value Ref Range   Glucose-Capillary 106 (H) 65 - 99 mg/dL  Glucose, capillary     Status: Abnormal   Collection Time: 12/22/17  4:21 PM  Result Value Ref Range   Glucose-Capillary 175 (H) 65 - 99 mg/dL   Comment 1 Notify RN    Comment 2 Document in Chart   Glucose, capillary     Status: Abnormal   Collection Time: 12/22/17  9:35 PM  Result Value Ref Range   Glucose-Capillary 171 (H) 65 - 99 mg/dL  Basic metabolic panel     Status: Abnormal   Collection Time: 12/23/17  3:34 AM  Result Value Ref Range   Sodium 139 135 - 145 mmol/L   Potassium 5.1 3.5 - 5.1 mmol/L   Chloride 103 101 - 111 mmol/L   CO2 26 22 - 32 mmol/L   Glucose, Bld 242 (H) 65 - 99 mg/dL   BUN 15 6 - 20 mg/dL  Creatinine, Ser 0.80 0.44 - 1.00 mg/dL   Calcium 9.4 8.9 - 10.3 mg/dL   GFR calc non Af Amer >60 >60 mL/min   GFR calc Af Amer >60 >60  mL/min    Comment: (NOTE) The eGFR has been calculated using the CKD EPI equation. This calculation has not been validated in all clinical situations. eGFR's persistently <60 mL/min signify possible Chronic Kidney Disease.    Anion gap 10 5 - 15    Comment: Performed at [facility] [address], [place],  [address]  Glucose, capillary     Status: Abnormal   Collection Time: 12/23/17  6:35 AM  Result Value Ref Range   Glucose-Capillary 258 (H) 65 - 99 mg/dL  Glucose, capillary     Status: Abnormal   Collection Time: 12/23/17  9:02 AM  Result Value Ref Range   Glucose-Capillary 289 (H) 65 - 99 mg/dL   Comment 1 Notify RN    Comment 2 Document in Chart   Glucose, capillary     Status: Abnormal   Collection Time: 12/23/17 11:41 AM  Result Value Ref Range   Glucose-Capillary 242 (H) 65 - 99 mg/dL   Comment 1 Notify RN    Comment 2 Document in Chart   Glucose, capillary     Status: Abnormal   Collection Time: 12/23/17  3:53 PM  Result Value Ref Range   Glucose-Capillary 229 (H) 65 - 99 mg/dL   Comment 1 Notify RN    Comment 2 Document in Chart     Current Facility-Administered Medications  Medication Dose Route Frequency Provider Last Rate Last Dose  . acetaminophen (TYLENOL) tablet 650 mg  650 mg Oral Q6H PRN Arby Barrette A, NP   650 mg at 12/14/17 0852  . albuterol (PROVENTIL) (2.5 MG/3ML) 0.083% nebulizer solution 2.5 mg  2.5 mg Nebulization Q2H PRN Edwin Dada, MD   2.5 mg at 12/22/17 1606  . buPROPion [facility]) tablet 100 mg  100 mg Oral BID Patrecia Pour, MD   100 mg at 12/23/17 1645  . chlorhexidine (PERIDEX) 0.12 % solution 15 mL  15 mL Mouth Rinse BID Cristal Ford, DO   15 mL at 12/23/17 1751  . cholestyramine (QUESTRAN) packet 4 g  4 g Oral Georgeanna Lea, MD   4 g at 12/23/17 0936  . enoxaparin (LOVENOX) injection 70 mg  70 mg Subcutaneous Q24H Dang, Thuy D, RPH   70 mg at 12/23/17 1430  . feeding supplement (PRO-STAT  SUGAR FREE 64) liquid 30 mL  30 mL Oral BID Patrecia Pour, MD   30 mL at 12/23/17 0923  . fentaNYL (SUBLIMAZE) injection 25 mcg  25 mcg Intravenous Q4H PRN Nita Sells, MD   25 mcg at 12/18/17 1546  . gabapentin (NEURONTIN) capsule 600 mg  600 mg Oral BID Patrecia Pour, MD   600 mg at 12/23/17 0258  . Gerhardt's butt cream   Topical Daily Vance Gather B, MD      . hydrALAZINE (APRESOLINE) injection 10 mg  10 mg Intravenous Q4H PRN Marijean Heath, NP   10 mg at 11/22/17 1358  . insulin aspart (novoLOG) injection 0-20 Units  0-20 Units Subcutaneous TID WC Nita Sells, MD   7 Units at 12/23/17 1645  . insulin aspart (novoLOG) injection 0-5 Units  0-5 Units Subcutaneous QHS Danford, Christopher P, MD      . insulin aspart (novoLOG) injection 8 Units  8 Units Subcutaneous TID WC Grunz,  Meredith Leeds, MD   8 Units at 12/23/17 1644  . [START ON 12/24/2017] insulin glargine (LANTUS) injection 40 Units  40 Units Subcutaneous QHS Danford, Suann Larry, MD      . levothyroxine (SYNTHROID, LEVOTHROID) tablet 75 mcg  75 mcg Oral QAC breakfast Nita Sells, MD   75 mcg at 12/23/17 0923  . lidocaine (XYLOCAINE) 2 % jelly 1 application  1 application Topical Once PRN Nita Sells, MD      . lidocaine (XYLOCAINE) 2 % jelly 1 application  1 application Topical Once PRN [name], MD      . lidocaine (XYLOCAINE) 4 % external solution 0-50 mL  0-50 mL Topical Once PRN [name], MD      . lidocaine (XYLOCAINE) 4 % external solution 0-50 mL  0-50 mL Topical Once PRN [name], MD      . lidocaine-EPINEPHrine (XYLOCAINE-EPINEPHrine) 1 %-1:200000 (PF) injection 0-30 mL  0-30 mL Intradermal Once PRN [name], MD      . lidocaine-EPINEPHrine (XYLOCAINE-EPINEPHrine) 1 %-1:200000 (PF) injection 0-30 mL  0-30 mL Intradermal Once PRN [name], MD      . MEDLINE mouth rinse  15 mL Mouth Rinse q12n4p Mikhail, Carbon, DO   15 mL at 12/22/17 1537  .  menthol-cetylpyridinium (CEPACOL) lozenge 3 mg  1 lozenge Oral PRN Deterding, Guadelupe Sabin, MD   3 mg at 11/27/17 2336  . metoprolol tartrate (LOPRESSOR) tablet 12.5 mg  12.5 mg Oral BID Nita Sells, MD   12.5 mg at 12/23/17 5056  . oxyCODONE-acetaminophen (PERCOCET/ROXICET) 5-325 MG per tablet 1-2 tablet  1-2 tablet Oral Q4H PRN Nita Sells, MD   2 tablet at 12/23/17 1426  . oxymetazoline (AFRIN) 0.05 % nasal spray 1 spray  1 spray Each Nare Once PRN [name], MD      . oxymetazoline (AFRIN) 0.05 % nasal spray 1 spray  1 spray Each Nare Once PRN [name], MD      . oxymetazoline (AFRIN) 0.05 % nasal spray 1 spray  1 spray Each Nare Once PRN [name], MD      . pantoprazole (PROTONIX) EC tablet 40 mg  40 mg Oral BID AC Nita Sells, MD   40 mg at 12/23/17 1645  . phenol (CHLORASEPTIC) mouth spray 1 spray  1 spray Mouth/Throat PRN Cristal Ford, DO   1 spray at 11/27/17 2145  . Racepinephrine HCl 2.25 % nebulizer solution 0.5 mL  0.5 mL Nebulization Once Cristal Ford, DO      . silver nitrate applicators applicator 1 Stick  1 Stick Topical Once PRN [name], MD      . silver nitrate applicators applicator 1 Stick  1 Stick Topical Once PRN [name], MD      . sodium chloride flush (NS) 0.9 % injection 10-40 mL  10-40 mL Intracatheter PRN Patrecia Pour, MD   10 mL at 12/13/17 0005  . traZODone (DESYREL) tablet 50 mg  50 mg Oral QHS PRN Faythe Dingwall, DO   50 mg at 12/22/17 2255  . TRIPLE ANTIBIOTIC [phone] OINT 1 application  1 application Apply externally Once PRN [name], MD        Musculoskeletal: Strength & Muscle Tone: decreased Gait & Station: unsteady Patient leans: N/A  Psychiatric Specialty Exam: Physical Exam  Constitutional: She is oriented to person, place, and time. She appears well-developed and well-nourished.  HENT:  Head: Normocephalic.  Neurological: She is alert and oriented to person, place,  and time.  Psychiatric: Her speech is normal and behavior is normal. Cognition and memory are normal. She expresses impulsivity. She exhibits a depressed mood. She expresses suicidal ideation. She expresses suicidal plans.    Review of Systems  Psychiatric/Behavioral: Positive for depression and suicidal ideas.  All other systems reviewed and are negative.   Blood pressure 138/63, pulse 86, temperature 97.8 F (36.6 C), temperature source Oral, resp. rate 20, height 5' 2"  (1.575 m), weight (!) 146.4 kg (322 lb 12.1 oz), SpO2 96 %.Body mass index is 59.03 kg/m.  General Appearance: Casual  Eye Contact:  Fair  Speech:  Normal Rate  Volume:  Decreased  Mood:  Anxious and Depressed  Affect:  Congruent,tearful  Thought Process:  Coherent and Descriptions of Associations: Intact  Orientation:  Full (Time, Place, and Person)  Thought Content:  Rumination  Suicidal Thoughts:  Yes.  with intent/plan  Homicidal Thoughts:  No  Memory:  Immediate;   Fair Recent;   Fair Remote;   Fair  Judgement:  Poor  Insight:  Lacking  Psychomotor Activity:  Decreased  Concentration:  Concentration: Fair and Attention Span: Fair  Recall:  [employer] of Knowledge:  Fair  Language:  Good  Akathisia:  No  Handed:  Right  AIMS (if indicated):     Assets:  Housing Leisure Time Resilience Social Support  ADL's:  Impaired  Cognition:  WNL  Sleep:        Treatment Plan Summary: Major depressive disorder, recurrent, severe without psychosis:  Treatment Plan Summary: -continue Wellbutrin 100 mg BID for depression -Continue seeking placement at [facility] for mood stabilization   Disposition: Recommend psychiatric Inpatient admission when medically cleared.  Waylan Boga, NP 12/23/2017 5:59 PM

## 2017-12-23 NOTE — Progress Notes (Signed)
Pt on room air with humidification PRN. O2 sats stable, 93-97%.

## 2017-12-23 NOTE — Progress Notes (Signed)
Rt unable to pass suction catheters. Pt very anxious on the side of the bed and complaining of pain when RT tries to suction her. Rt was able to remove thick crusty brown mucus plug but still unable to pass suction catheter. Pt sats 97% on 28% ATC with good air movement. RT called Dr. Constance Holster again to notify him that RT is unable to pass catheter and Dr. Constance Holster advised RT to call Dr. Erik Obey as he was in surgery and unable to help change trach. RT unable to reach Dr. Erik Obey at this time but left detailed voicemail explaining situation. Pt not in no apparent distress and able to talk and VS stable sitting on the side of the bed. Rt will continue to monitor.

## 2017-12-24 DIAGNOSIS — E118 Type 2 diabetes mellitus with unspecified complications: Secondary | ICD-10-CM

## 2017-12-24 LAB — GLUCOSE, CAPILLARY
GLUCOSE-CAPILLARY: 212 mg/dL — AB (ref 65–99)
GLUCOSE-CAPILLARY: 99 mg/dL (ref 65–99)
GLUCOSE-CAPILLARY: 131 mg/dL — AB (ref 65–99)
Glucose-Capillary: 124 mg/dL — ABNORMAL HIGH (ref 65–99)

## 2017-12-24 LAB — BASIC METABOLIC PANEL
Anion gap: 9 (ref 5–15)
BUN: 18 mg/dL (ref 6–20)
CHLORIDE: 102 mmol/L (ref 101–111)
CO2: 26 mmol/L (ref 22–32)
CREATININE: 0.83 mg/dL (ref 0.44–1.00)
Calcium: 9.4 mg/dL (ref 8.9–10.3)
GFR calc Af Amer: >60 mL/min (ref >60)
GFR calc non Af Amer: >60 mL/min (ref >60)
GLUCOSE: 205 mg/dL — AB (ref 65–99)
POTASSIUM: 4.5 mmol/L (ref 3.5–5.1)
Sodium: 137 mmol/L (ref 135–145)

## 2017-12-24 NOTE — Progress Notes (Signed)
CSW continuing to follow for disposition planning. CSW to support with referral to [facility] when appropriate.  [name], [place]

## 2017-12-24 NOTE — Progress Notes (Signed)
PROGRESS NOTE    [name]  KYH:[id number] DOB: [birth year] DOA: 11/20/2017 PCP: Sandi Mariscal, MD      Brief Narrative:  [name] is a 42 y.o. female with a history of morbid obesity (BMI 41), IDDM, OSA, HTN, Cushing's disease and depression who presented 3/13 after suicide attempt by intentional overdose of baclofen, trazodone, and tramadol. She reportedly was encephalopathic and experienced a seizure, intubated for airway protection and admitted to the ICU. After extubation she developed stridor initially responsive to racemic epinephrine with laryngoscopy revealing fixed vocal cords, though when this recurred 3/21 it was unresponsive necessitating tracheostomy with laryngoscopy revealing circumferential subglottic swelling with necrotic debris and fibrinous exudate. Respiratory status has stabilized, though she continued to have significant secretions with odor. Clindamycin was started 3/27 and ENT reevaluated the patient 4/2 due to CT showing left paralaryngeal abscess. Flexible laryngoscopy was repeated showing pooled secretions in pharynx, and inability to assess vocal cord mobility or subglottic patency due to poor laryngeal airway. Psychiatry has continued to recommend transfer to inpatient psychiatry for suicidal ideation and attempt when medically stable.  Laryngoscopy repeated again 4/10, this time showed resolution of infection, stable subglottic granulation tissue.  Plan for longterm trach, likely repeat surgery in >4-6 months.  Trach team has been consulted.     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  During most recent laryngoscopy on 4/10, ENT felt infection resolved.  Estimated she will need very long term trach  in place, they would evaluate in 3-6 months for repeat surgery on the subglottic stenosis, but this would be determined by Dr. Constance Holster as an outpatient.   Over the weekend 4/13-4/15, patient became more stridorous.  Found ultimately to have clogging of her trach, which was replaced with a cuffless trach with an inner removable cannula.  Trach team consulted. -Consult trach team for teaching, arrange outpatient trach follow up  -Will need ENT follow-up as an outpatient in 3-6 months for consideration of operative treatment of her subglottic stenosis with Dr. Constance Holster [facility] consult for resolving minor skin irritation around her trach site     Dyspnea with exertion This was from upper airway plugging.  Now resolved.  CXR clear, personally reviewed.  Echo normal EF, no RWMA.    Intentional overdose Suicidal ideation Depression -Continue bupropion -Consult Psychiatry, appreciate cares  Acute kidney injury Resolved.  Type 2 diabetes, with hyperglycemia, insulin-dependent -Continue Lantus -Continue SSI  Seizure disorder -Continue gabapentin  Hypothyroidism -Continue levothyroxine  Morbid obesity -Continue pro-stat supplement  Cushing's disease  Hypertension Blood pressure at goal -Continue metoprolol, Lasix  Normocytic anemia Etiology from acute illness.  Stable.  Hyperkalemia Resolved  Other medications -Continue pantoprazole, cholestyramine  OSA Previously on CPAP.  Desats here overnight.  Morning ABG without hypercapnia. -Continue  supplemental oxygen at night      DVT prophylaxis: Lovenox Code Status: FULL Family Communication: None present MDM and disposition Plan: The below labs and imaging reports were reviewed, summarized above.  The patient presented with severe baclofen, trazodone, tramadol overdose, required intubation, complicated by subglottic infection and stenosis, now with tracheostomy in place, likely long-term tracheostomy.  Infection now resolved, on  supplemental oxygen at night only.  Had trach tube replaced yesterday, stridor and dyspnea completely resolved.  Her trach site has minimal granulation  tissue, which is nearly resolved, WOC will train patient to manage this by herself. Once this granulation tissue around the trach site is healed enough for patient to manage herslef, and trach team has finished teaching, patient stable for discharge to [facility]         Consultants:   ENT  Psychiatry  Procedures:   Laryngoscopy 4/10  PROCEDURE DETAILS: The patient was taken to the operating room and placed on the operating table in the supine position. Following induction of general endotracheal anesthesia, the table was turned 90 degrees.  A maxillary tooth protector was used throughout the case.  A Jako laryngoscope was entered into the oral cavity used to inspect the larynx.  The cords were swollen, the arytenoids were edematous as well.  The subglottic larynx revealed irregular granular swollen tissue with some fibrinous exudate, not as necrotic appearing weeks ago but still with significant obstruction.  I was able to downsize the tracheostomy to a 6 mm cuffed adjustable length trach tube.  The previous tracheostomy tube was partially occluded so that was removed in order to facilitate ventilation during the case.  Patient was then awakened and transferred to recovery in stable condition.  PATIENT DISPOSITION:  To PACU, stable  Addendum: Of note, the wound at the inferior aspect of the tracheostomy site was granulating and did not appear to be infected at all.  I placed a moist and 4 x 4 in this wound and kept most of it hanging out for safety reasons.  The tracheostomy tube can be deflated as soon as she is awake enough.  She may resume her diet.  She is stable for transfer to whichever facility is going to be appropriate.  She will likely require the tracheostomy for the foreseeable future, as she is going to be very slow to heal this  issue.   LE [place] 4/7 Final Interpretation: Right: Portions of this examination were limited- see technologist comments above. No cystic structure found in the popliteal fossa. Techniquely limited due to patient body habitus and critical condition. Therefore, this study is inconclusive. Left: Portions of this examination were limited- see technologist comments above. No cystic structure found in the popliteal fossa. Techniquely limited due to patient body habitus and critical condition. Therefore, this study is inconclusive.     Antimicrobials:   Clindamycin  Zosyn   Augmentin   Subjective: No stridor, shortness of breath, fever, cough, sputum, drainage from her trach site, redness around her trach site, bleeding, wheezing.  Feels much better since exchanging the trach for a cuffless trach yesterday.  Respiratory note that her inner lumen of her dual-lumen trach was taken on today clean, and had minimal secretions.  Objective: Vitals:   12/24/17 1151 12/24/17 1602 12/24/17 2100 12/24/17 2127  BP: 107/62   112/75  Pulse: 80 96  85  Resp:  18 17 18   Temp:    97.8 F (36.6 C)  TempSrc:    Oral  SpO2:  98% 96% 97%  Weight:      Height:        Intake/Output Summary (Last 24 hours) at 12/24/2017 2201 Last data filed at 12/24/2017 1230 Gross per 24 hour  Intake 720 ml  Output -  Net 720 ml   Filed Weights   12/22/17 0400 12/23/17 0437 12/24/17 0500  Weight: (!) 147.6 kg (325 lb 6.4 oz) (!) 146.4 kg (322 lb 12.1 oz) (!) 144.3 kg (318 lb 1.6 oz)    Examination: General appearance: Adult female, walking  in the hall, no acute distress.  Interactive. HEENT: Anicteric, conjunctival pink, lids and lashes normal, oropharynx normal, Mallampati 4, no oral lesions, hearing normal, trach has minimal granulation tissue around it, no drainage, redness, or induration. Skin: Skin is warm and dry, without jaundice, redness, swelling, or drainage.  There is some remaining granulation tissue  near her trach site. Cardiac: Heart rate regular, regular rhythm, no murmurs, nonpitting lower extremity edema. Respiratory: Breahting comfrotable, voice normal, no wheezing or rales, diminished throughout. abdomen: Abdomen soft.  No TTP. No ascites, distension, hepatosplenomegaly.   MSK: No deformity or effusions in the large joints of the upper or lower extremities bilaterally. Neuro: Speech fluent around her Passy-Muir valve, awake and alert, oriented to person place and time, moves all extremities equally with normal strength. Psych: Attention normal, affect frustrated, sometimes angry sometimes tearful.  Oriented to person place and time.         Data Reviewed: I have personally reviewed following labs and imaging studies:  CBC: Recent Labs  Lab 12/18/17 0532  WBC 4.7  HGB 9.6*  HCT 31.2*  MCV 92.3  PLT 481   Basic Metabolic Panel: Recent Labs  Lab 12/18/17 0532 12/22/17 0259 12/23/17 0334 12/24/17 0657  NA 140 138 139 137  K 4.4 4.7 5.1 4.5  CL 101 103 103 102  CO2 29 26 26 26   GLUCOSE 180* 164* 242* 205*  BUN 11 14 15 18   CREATININE 0.88 0.85 0.80 0.83  CALCIUM 9.0 9.2 9.4 9.4   GFR: Estimated Creatinine Clearance: 121.1 mL/min (by C-G formula based on SCr of 0.83 mg/dL). Liver Function Tests: No results for input(s): AST, ALT, ALKPHOS, BILITOT, PROT, ALBUMIN in the last 168 hours. No results for input(s): LIPASE, AMYLASE in the last 168 hours. No results for input(s): AMMONIA in the last 168 hours. Coagulation Profile: No results for input(s): INR, PROTIME in the last 168 hours. Cardiac Enzymes: No results for input(s): CKTOTAL, CKMB, CKMBINDEX, TROPONINI in the last 168 hours. BNP (last 3 results) No results for input(s): PROBNP in the last 8760 hours. HbA1C: No results for input(s): HGBA1C in the last 72 hours. CBG: Recent Labs  Lab 12/23/17 2122 12/24/17 0646 12/24/17 1150 12/24/17 1704 12/24/17 2120  GLUCAP 256* 212* 99 131* 124*   Lipid  Profile: No results for input(s): CHOL, HDL, LDLCALC, TRIG, CHOLHDL, LDLDIRECT in the last 72 hours. Thyroid Function Tests: No results for input(s): TSH, T4TOTAL, FREET4, T3FREE, THYROIDAB in the last 72 hours. Anemia Panel: No results for input(s): VITAMINB12, FOLATE, FERRITIN, TIBC, IRON, RETICCTPCT in the last 72 hours. Urine analysis:    Component Value Date/Time   COLORURINE RED (A) 10/31/2016 2130   APPEARANCEUR CLOUDY (A) 10/31/2016 2130   LABSPEC 1.027 10/31/2016 2130   PHURINE 6.0 10/31/2016 2130   GLUCOSEU >=500 (A) 10/31/2016 2130   HGBUR LARGE (A) 10/31/2016 2130   BILIRUBINUR NEGATIVE 10/31/2016 2130   [place] NEGATIVE 10/31/2016 2130   PROTEINUR 100 (A) 10/31/2016 2130   UROBILINOGEN 0.2 04/09/2014 1157   NITRITE NEGATIVE 10/31/2016 2130   LEUKOCYTESUR NEGATIVE 10/31/2016 2130   Sepsis Labs: @LABRCNTIP (procalcitonin:4,lacticacidven:4)  )No results found for this or any previous visit (from the past 240 hour(s)).       Radiology Studies: No results found.      Scheduled Meds: . buPROPion  100 mg Oral BID  . chlorhexidine  15 mL Mouth Rinse BID  . cholestyramine  4 g Oral QODAY  . enoxaparin (LOVENOX) injection  70 mg Subcutaneous Q24H  .  feeding supplement (PRO-STAT SUGAR FREE 64)  30 mL Oral BID  . gabapentin  600 mg Oral BID  . Gerhardt's butt cream   Topical Daily  . insulin aspart  0-20 Units Subcutaneous TID WC  . insulin aspart  0-5 Units Subcutaneous QHS  . insulin aspart  8 Units Subcutaneous TID WC  . insulin glargine  40 Units Subcutaneous QHS  . levothyroxine  75 mcg Oral QAC breakfast  . mouth rinse  15 mL Mouth Rinse q12n4p  . metoprolol tartrate  12.5 mg Oral BID  . pantoprazole  40 mg Oral BID AC   Continuous Infusions:    LOS: 34 days    Time spent: 25 minutes    Edwin Dada, MD [facility] 12/24/2017, 9:30 AM    Pager [phone] --- please page though AMION:  [email or web address]  7PM-7AM, please contact night-coverage

## 2017-12-24 NOTE — Progress Notes (Signed)
  Speech Language Pathology Treatment: [name] Speaking valve  Patient Details Name: [name] MRN: [id number] DOB: [birth year] Today's Date: 12/24/2017 Time: 8177-1165 SLP Time Calculation (min) (ACUTE ONLY): 12 min  Assessment / Plan / Recommendation Clinical Impression  Trach changed to cuffless Shiley yesterday.  Pt able to demonstrate/discuss care, cleaning, donning/doffing valve independently.  Answered questions from pt/husband regarding PMV.  Phonation much better quality and volume with new trach.  Pt is very pleased, feels more comfortable.  May use valve during all waking hours, remove/clean at night.  Pt verbalizes understanding.  No further SLP f/u is needed - our services will sign off.     HPI HPI: Patient is a 42 y/o female who presents with drug OD and Sz. Intubated 3/13-3/15. resp failure with emergent trach 3/21.  ENT flexible laryngoscopy revealed posterior supraglottic swelling; swollen vocal folds with no abduction; edematous arytenoids; entire subglottis circumferentially swollen with necrotic debris and fibrinous exudate.       SLP Plan  All goals met       Recommendations         Patient may use Passy-Muir Speech Valve: During all waking hours (remove during sleep)         Plan: All goals met       GO                [name] 12/24/2017, 11:55 AM

## 2017-12-24 NOTE — Progress Notes (Signed)
Occupational Therapy Treatment Patient Details Name: [name] MRN: [id number] DOB: Oct 19, 1975 Today's Date: 12/24/2017    History of present illness Patient is a 42 y/o female who presents with drug OD and Sz. Intubated 3/13-3/15. resp failure with emergent trach 3/21 due to vocal cord dysfunction. CT of throat- suggests left paralaryngeal abscess. s/p Laryngoscopy 4/10. PMH includes ORIF RLE, depression, anxiety, morbid obesity, DM, Cushing's, Rt uni knee replacement, HTN, left ankle surgery, dyslipidemia, hypothyroidism. Underwent larangyscopy with trach downsized to 58mm. 4/10.   OT comments  Pt is making excellent progress!  She is able to perform ADLs with min A.  She is very fearful of leaning forward with her trach which impedes her ability to perform LB ADLs and toileting.  Worked on this to build strength and confidence.   She requires supervision for functional transfers/mobility  Follow Up Recommendations  Other (comment)(follow up OT at [facility] )    Equipment Recommendations  None recommended by OT    Recommendations for Other Services      Precautions / Restrictions Precautions Precautions: Other (comment) Precaution Comments: trach       Mobility Bed Mobility                  Transfers Overall transfer level: Needs assistance Equipment used: None Transfers: Sit to/from Stand Sit to Stand: Supervision Stand pivot transfers: Supervision       General transfer comment: Pt occasionally supports self on furniture     Balance Overall balance assessment: Needs assistance Sitting-balance support: Feet supported;No upper extremity supported Sitting balance-Leahy Scale: Good     Standing balance support: During functional activity Standing balance-Leahy Scale: Good                             ADL either performed or assessed with clinical judgement   ADL       Grooming: Wash/dry hands;Wash/dry face;Oral care;Brushing  hair;Supervision/safety;Standing           Upper Body Dressing : Minimal assistance;Sitting Upper Body Dressing Details (indicate cue type and reason): pt cautious due to trach  Lower Body Dressing: Minimal assistance;Sit to/from stand Lower Body Dressing Details (indicate cue type and reason): Pt able to don/doff socks EOB      Toileting- Clothing Manipulation and Hygiene: Moderate assistance;Sit to/from stand Toileting - Clothing Manipulation Details (indicate cue type and reason): Pt not yet able to access peri area due to fear of bending forward.  Practiced bending with UE support in prep for increasing independence      Functional mobility during ADLs: Supervision/safety General ADL Comments: Pt voices fear of disloging trach and this fear is limiting her with ADLs.  bending forward is an area of great fear.        Vision       Perception     Praxis      Cognition Arousal/Alertness: Awake/alert Behavior During Therapy: WFL for tasks assessed/performed Overall Cognitive Status: Within Functional Limits for tasks assessed                                 General Comments: Pt voices frustration with situation.  Encouragement provided         Exercises     Shoulder Instructions       General Comments pt's sitter reports pt has walked 3x today     Pertinent Vitals/ Pain  Pain Assessment: No/denies pain  Home Living                                          Prior Functioning/Environment              Frequency  Min 2X/week        Progress Toward Goals  OT Goals(current goals can now be found in the care plan section)  Progress towards OT goals: Progressing toward goals     Plan Discharge plan remains appropriate    Co-evaluation                 AM-PAC PT "6 Clicks" Daily Activity     Outcome Measure   Help from another person eating meals?: None Help from another person taking care of personal  grooming?: None Help from another person toileting, which includes using toliet, bedpan, or urinal?: A Little Help from another person bathing (including washing, rinsing, drying)?: A Little Help from another person to put on and taking off regular upper body clothing?: A Little Help from another person to put on and taking off regular lower body clothing?: A Little 6 Click Score: 20    End of Session    OT Visit Diagnosis: Muscle weakness (generalized) (M62.81)   Activity Tolerance Patient tolerated treatment well   Patient Left in chair;with call bell/phone within reach;with nursing/sitter in room   Nurse Communication          Time: 9767-3419 OT Time Calculation (min): 25 min  Charges: OT General Charges $OT Visit: 1 Visit OT Treatments $Self Care/Home Management : 23-37 mins  [facility], OTR/L [phone]    Lucille Passy M 12/24/2017, 3:28 PM

## 2017-12-24 NOTE — Progress Notes (Signed)
No blood return noted from midline for BMP. Phlebotomist notified for collection.

## 2017-12-24 NOTE — Progress Notes (Signed)
Spoke with Dr. Loleta Books, Approved pt to have MP3 player and to wear shorts and t-shirt. Arlis Porta

## 2017-12-25 LAB — GLUCOSE, CAPILLARY
GLUCOSE-CAPILLARY: 163 mg/dL — AB (ref 65–99)
Glucose-Capillary: 221 mg/dL — ABNORMAL HIGH (ref 65–99)
Glucose-Capillary: 135 mg/dL — ABNORMAL HIGH (ref 65–99)

## 2017-12-25 LAB — BASIC METABOLIC PANEL
ANION GAP: 10 (ref 5–15)
BUN: 20 mg/dL (ref 6–20)
CALCIUM: 9.2 mg/dL (ref 8.9–10.3)
CO2: 27 mmol/L (ref 22–32)
CREATININE: 0.86 mg/dL (ref 0.44–1.00)
Chloride: 101 mmol/L (ref 101–111)
GFR calc non Af Amer: >60 mL/min (ref >60)
Glucose, Bld: 172 mg/dL — ABNORMAL HIGH (ref 65–99)
Potassium: 4.5 mmol/L (ref 3.5–5.1)
SODIUM: 138 mmol/L (ref 135–145)

## 2017-12-25 MED ORDER — BENZONATATE 100 MG PO CAPS
100.0000 mg | ORAL_CAPSULE | Freq: Three times a day (TID) | ORAL | Status: DC | PRN
  Administered 2017-12-26 – 2017-12-31 (×5): 100 mg via ORAL
  Filled 2017-12-25 (×5): qty 1

## 2017-12-25 NOTE — Progress Notes (Signed)
PROGRESS NOTE    [name]  ZWC:[id number] DOB: Mar 19, 1976 DOA: 11/20/2017 PCP: Sandi Mariscal, MD   Brief Narrative:  [name] Humphreyis a 42 y.o.femalewith a history of morbid obesity (BMI 56), IDDM, OSA, HTN, Cushing's disease and depression who presented 3/13 after suicide attempt by intentional overdose of baclofen, trazodone, and tramadol. She reportedly was encephalopathic and experienced a seizure, intubated for airway protection and admitted to the ICU. After extubation she developed stridor initially responsive to racemic epinephrine with laryngoscopy revealing fixed vocal cords, though when this recurred 3/21 it was unresponsive necessitating tracheostomy with laryngoscopy revealing circumferential subglottic swelling with necrotic debris and fibrinous exudate. Respiratory status has stabilized, though she continued to have significant secretions with odor. Clindamycin was started 3/27 and ENT reevaluated the patient 4/2 due to CT showing left paralaryngeal abscess. Flexible laryngoscopy was repeated showing pooled secretions in pharynx, and inability to assess vocal cord mobility or subglottic patency due to poor laryngeal airway. Psychiatry has continued to recommend transfer to inpatient psychiatry for suicidal ideation and attempt when medically stable.  Laryngoscopy repeated again 4/10, this time showed resolution of infection, stable subglottic granulation tissue.  Plan for longterm trach, likely repeat surgery in >4-6 months.  Trach team has been consulted.   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 -see discussion above -PCCM consulted and appreciated -ENT was consulted and appreciated, laryngoscopy on 12/18/2017 showed an infection had resolved -Patient was treated with 2 weeks of antibiotics including 4 days of clindamycin followed by 7 days of Zosyn and 3 days of Augmentin -ENT would like to follow-up with patient as  an outpatient in the next 3-6 months for possible repeat surgery of subglottic stenosis -Trach team continues to follow patient and teach patient -Wound care consulted for skin irritation around trach site -Currently on room air during the day and oxygen at night -will add on tessalon for cough  Dyspnea with exertion  -likely due to airway plugging -resolved -Echocardiogram showed EF 55-60%  Intentional overdose/Suicidal ideation/Depression -continue Wellbutrin, trazodone as needed for insomnia -Psychiatry consulted and appreciated, recommended inpatient psychiatry admission  Acute kidney injury -Resolved  Diabetes mellitus, type II -Continue Lantus, insulin sliding scale, CBG monitoring  Seizure disorder -Continue gabapentin  Hypothyroidism -Continue Synthroid  Morbid obesity/Cushing's disease -Continue pro-stat, will need outpatient follow-up and monitoring  Essential hypertension -Continue Lasix, metoprolol  Obstructive sleep apnea -Continue supplemental oxygen at night, patient was previously on CPAP  DVT Prophylaxis  lovenox  Code Status: Full  Family Communication: None at bedside  Disposition Plan: Admitted. Pending inpatient psych admission  Consultants ENT PCCM Psychiatry  Procedures  Intubation/extubation  Renal US LE Ultrasound Laryngoscopy  Trace placement  Antibiotics   Anti-infectives (From admission, onward)   Start     Dose/Rate Route Frequency Ordered Stop   12/19/17 1000  amoxicillin-clavulanate (AUGMENTIN) 875-125 MG per tablet 1 tablet     1 tablet Oral Every 12 hours 12/18/17 2117 12/22/17 2253   12/12/17 2200  piperacillin-tazobactam (ZOSYN) IVPB 3.375 g  Status:  Discontinued     3.375 g 12.5 mL/hr over 240 Minutes Intravenous Every 8 hours 12/12/17 1720 12/18/17 2117   12/12/17 1800  piperacillin-tazobactam (ZOSYN) IVPB 3.375 g     3.375 g 100 mL/hr over 30 Minutes Intravenous  Once 12/12/17 1720 12/12/17 2059   12/09/17 1400   clindamycin (CLEOCIN) capsule 300 mg  Status:  Discontinued     300 mg Oral Every 8 hours 12/09/17 1015 12/09/17 1016  12/09/17 1400  clindamycin (CLEOCIN) capsule 450 mg  Status:  Discontinued     450 mg Oral Every 8 hours 12/09/17 1016 12/12/17 1647   12/03/17 1800  clindamycin (CLEOCIN) IVPB 300 mg  Status:  Discontinued     300 mg 100 mL/hr over 30 Minutes Intravenous Every 6 hours 12/03/17 1458 12/09/17 1015      Subjective:   [name] seen and examined today.  Feeling better today.  Denies current shortness of breath, chest pain, abdominal pain, nausea vomiting, diarrhea constipation.  Complains of nagging cough.  Objective:   Vitals:   12/25/17 0332 12/25/17 0622 12/25/17 0955 12/25/17 1119  BP:  (!) 97/56    Pulse:  80 84 86  Resp:  16 18 18   Temp:  98.4 F (36.9 C)    TempSrc:  Oral    SpO2: 96% 97% 99% 98%  Weight:      Height:       No intake or output data in the 24 hours ending 12/25/17 1258 Filed Weights   12/22/17 0400 12/23/17 0437 12/24/17 0500  Weight: (!) 147.6 kg (325 lb 6.4 oz) (!) 146.4 kg (322 lb 12.1 oz) (!) 144.3 kg (318 lb 1.6 oz)    Exam  General: Well developed, well nourished, NAD, morbidly obese  HEENT: NCAT, mucous membranes moist.   Neck: Trach, minimal granulation tissue with healing wounds  Cardiovascular: S1 S2 auscultated, no rubs, murmurs or gallops. Regular rate and rhythm.  Respiratory: Clear to auscultation bilaterally with equal chest rise  Abdomen: Soft, obese, nontender, nondistended, + bowel sounds  Extremities: warm dry without cyanosis clubbing or edema  Neuro: AAOx3, nonfocal  Psych: appropriate mood and affect, pleasant    Data Reviewed: I have personally reviewed following labs and imaging studies  CBC: No results for input(s): WBC, NEUTROABS, HGB, HCT, MCV, PLT in the last 168 hours. Basic Metabolic Panel: Recent Labs  Lab 12/22/17 0259 12/23/17 0334 12/24/17 0657 12/25/17 0512  NA 138 139 137 138   K 4.7 5.1 4.5 4.5  CL 103 103 102 101  CO2 26 26 26 27   GLUCOSE 164* 242* 205* 172*  BUN 14 15 18 20   CREATININE 0.85 0.80 0.83 0.86  CALCIUM 9.2 9.4 9.4 9.2   GFR: Estimated Creatinine Clearance: 116.9 mL/min (by C-G formula based on SCr of 0.86 mg/dL). Liver Function Tests: No results for input(s): AST, ALT, ALKPHOS, BILITOT, PROT, ALBUMIN in the last 168 hours. No results for input(s): LIPASE, AMYLASE in the last 168 hours. No results for input(s): AMMONIA in the last 168 hours. Coagulation Profile: No results for input(s): INR, PROTIME in the last 168 hours. Cardiac Enzymes: No results for input(s): CKTOTAL, CKMB, CKMBINDEX, TROPONINI in the last 168 hours. BNP (last 3 results) No results for input(s): PROBNP in the last 8760 hours. HbA1C: No results for input(s): HGBA1C in the last 72 hours. CBG: Recent Labs  Lab 12/24/17 0646 12/24/17 1150 12/24/17 1704 12/24/17 2120 12/25/17 0620  GLUCAP 212* 99 131* 124* 163*   Lipid Profile: No results for input(s): CHOL, HDL, LDLCALC, TRIG, CHOLHDL, LDLDIRECT in the last 72 hours. Thyroid Function Tests: No results for input(s): TSH, T4TOTAL, FREET4, T3FREE, THYROIDAB in the last 72 hours. Anemia Panel: No results for input(s): VITAMINB12, FOLATE, FERRITIN, TIBC, IRON, RETICCTPCT in the last 72 hours. Urine analysis:    Component Value Date/Time   COLORURINE RED (A) 10/31/2016 2130   APPEARANCEUR CLOUDY (A) 10/31/2016 2130   LABSPEC 1.027 10/31/2016 2130  PHURINE 6.0 10/31/2016 2130   GLUCOSEU >=500 (A) 10/31/2016 2130   HGBUR LARGE (A) 10/31/2016 2130   BILIRUBINUR NEGATIVE 10/31/2016 2130   KETONESUR NEGATIVE 10/31/2016 2130   PROTEINUR 100 (A) 10/31/2016 2130   UROBILINOGEN 0.2 04/09/2014 1157   NITRITE NEGATIVE 10/31/2016 2130   LEUKOCYTESUR NEGATIVE 10/31/2016 2130   Sepsis Labs: @LABRCNTIP (procalcitonin:4,lacticidven:4)  )No results found for this or any previous visit (from the past 240 hour(s)).     Radiology Studies: No results found.   Scheduled Meds: . buPROPion  100 mg Oral BID  . chlorhexidine  15 mL Mouth Rinse BID  . cholestyramine  4 g Oral QODAY  . enoxaparin (LOVENOX) injection  70 mg Subcutaneous Q24H  . feeding supplement (PRO-STAT SUGAR FREE 64)  30 mL Oral BID  . gabapentin  600 mg Oral BID  . Gerhardt's butt cream   Topical Daily  . insulin aspart  0-20 Units Subcutaneous TID WC  . insulin aspart  0-5 Units Subcutaneous QHS  . insulin aspart  8 Units Subcutaneous TID WC  . insulin glargine  40 Units Subcutaneous QHS  . levothyroxine  75 mcg Oral QAC breakfast  . mouth rinse  15 mL Mouth Rinse q12n4p  . metoprolol tartrate  12.5 mg Oral BID  . pantoprazole  40 mg Oral BID AC   Continuous Infusions:   LOS: 35 days   Time Spent in minutes   30 minutes  Brissia Delisa D.O. on 12/25/2017 at 12:58 PM  Between 7am to 7pm - Pager - [phone]  After 7pm go to [email or web address] - password [email or web address]  And look for the night coverage person covering for me after hours  [facility] Office  [phone]

## 2017-12-25 NOTE — Progress Notes (Signed)
RT instructed pt on basic trach care including changing of her inner cannula and changing of the dressing. Pt given mirror to better visualize area and help finding site. Pt requested to hold off learning to change her trach ties d/t her "anxiety of having a new trach." Pt stated she feels more comfortable with learning slowly how to take care of her own trach. RT instructed pt to have supervision while doing her trach care.

## 2017-12-25 NOTE — Progress Notes (Signed)
Nutrition Follow-up  DOCUMENTATION CODES:   Morbid obesity  INTERVENTION:  Pro-stat 9m BID each supplement provides 100 calories and 15 grams of protein Continue Milk on all trays  NUTRITION DIAGNOSIS:   Increased nutrient needs related to acute illness as evidenced by estimated needs. -ongoing  GOAL:   Patient will meet greater than or equal to 90% of their needs -met  MONITOR:   PO intake, I & O's, Weight trends, Labs, Supplement acceptance  ASSESSMENT:   42yo female admitted with acute toxic encephalopathy secondary to intentional OD on baclofan, trazadone and  tramadol, seizures. Pt intubate for airway protection. Pt with hx of Cushing's, DM, OSA  3/21 - Underwent laryngoscopy and trached 3/22 - Still suffering from pharyngeal dysphagia due to edmea, unable to use PMSV. 3/23 - Cortrak unable to be placed due to nare sensitivity, tube going into airway 3/25 - MBSS completed - patient recommended to continue to be NPO 3/27 - SLP evaluated again, recommended alternative means of nutrition, RD paged MD about cortrak placement and tubefeeding x2, he is deferring to speech pathology and ENT. 3/28 - No tube placed 3/29 - MBSS done, Advanced to NDD3/thin liquids 4/10 - Repeat laryngoscopy, trach downsized 4/15 - Switched to Cuffless Trach  Ms. Crite has been ambulating, eating 100%, continues to take pro-stat.  Weight stable.  Labs reviewed Medications reviewed and include:  Insulin, Questran  Diet Order:  Diet Carb Modified Fluid consistency: Thin; Room service appropriate? Yes  EDUCATION NEEDS:   Not appropriate for education at this time  Skin:  Skin Assessment: Skin Integrity Issues: Skin Integrity Issues:: Incisions Incisions: Closed incision to neck Other: MASD: abdomen, bilateral groin, under breasts  Last BM:  12/22/2017  Height:   Ht Readings from Last 1 Encounters:  12/08/17 5' 2"  (1.575 m)    Weight:   Wt Readings from Last 1 Encounters:   12/24/17 (!) 318 lb 1.6 oz (144.3 kg)    Ideal Body Weight:  52.3 kg  BMI:  Body mass index is 58.18 kg/m.  Estimated Nutritional Needs:   Kcal:  1832-2200 calories (ABW x25-30)  Protein:  115-131 grams  Fluid:  1.8-2.2L  WSatira Anis Koran Seabrook, MS, RD LDN Inpatient Clinical Dietitian Pager [phone]

## 2017-12-25 NOTE — Progress Notes (Signed)
RN called into room by patient. Patient stated that her left arm was redder than normal and felt hot to the touch but was not painful.  RN examined arm did not see anything that felt or looked abnormal, so ice was applied to affected area. Will continue to monitor and have DR double check as well.

## 2017-12-25 NOTE — Progress Notes (Signed)
Physical Therapy Treatment Patient Details Name: [name] MRN: [id number] DOB: 05/02/1976 Today's Date: 12/25/2017    History of Present Illness Patient is a 42 y/o female who presents with drug OD and Sz. Intubated 3/13-3/15. resp failure with emergent trach 3/21 due to vocal cord dysfunction. CT of throat- suggests left paralaryngeal abscess. s/p Laryngoscopy 4/10. PMH includes ORIF RLE, depression, anxiety, morbid obesity, DM, Cushing's, Rt uni knee replacement, HTN, left ankle surgery, dyslipidemia, hypothyroidism. Underwent larangyscopy with trach downsized to 74mm. 4/10.    PT Comments    Patient seen for activity progression. Mobilizing well today with increased distance and better overall mood. Patient ambulated long halls then extended seated rest break on rollator while escorting her family members out to main entrance. Sat with patient for a few minutes outside which brightened her spirits significantly. Then returned for another bout of ambulation before returning to room.  Follow Up Recommendations  Supervision for mobility/OOB;Other (comment)(plan for inpatient psych)     Equipment Recommendations  None recommended by PT    Recommendations for Other Services       Precautions / Restrictions Precautions Precautions: Other (comment) Precaution Comments: trach    Mobility  Bed Mobility               General bed mobility comments: received in chair  Transfers Overall transfer level: Needs assistance Equipment used: None Transfers: Sit to/from Stand Sit to Stand: Supervision Stand pivot transfers: Supervision       General transfer comment: Pt occasionally supports self on furniture   Ambulation/Gait Ambulation/Gait assistance: Supervision Ambulation Distance (Feet): 600 Feet Assistive device: None Gait Pattern/deviations: Step-through pattern;Wide base of support;Drifts right/left Gait velocity: decreased   General Gait Details: slow, mildly  unsteady waddling gait with increased lateral sway and ocassional use of rail for UE support. HR stable throughout, modest DOE 2/4 with saturations  >94% on RA throughout.    Stairs             Wheelchair Mobility    Modified Rankin (Stroke Patients Only)       Balance Overall balance assessment: Needs assistance Sitting-balance support: Feet supported;No upper extremity supported Sitting balance-Leahy Scale: Good     Standing balance support: During functional activity Standing balance-Leahy Scale: Good                              Cognition Arousal/Alertness: Awake/alert Behavior During Therapy: WFL for tasks assessed/performed Overall Cognitive Status: Within Functional Limits for tasks assessed                                 General Comments: Patient in better spirits today      Exercises      General Comments        Pertinent Vitals/Pain Pain Assessment: No/denies pain    Home Living                      Prior Function            PT Goals (current goals can now be found in the care plan section) Acute Rehab PT Goals Patient Stated Goal: to go home PT Goal Formulation: With patient Time For Goal Achievement: 01/02/18 Potential to Achieve Goals: Good Progress towards PT goals: Progressing toward goals    Frequency    Min 3X/week  PT Plan Current plan remains appropriate    Co-evaluation              AM-PAC PT "6 Clicks" Daily Activity  Outcome Measure  Difficulty turning over in bed (including adjusting bedclothes, sheets and blankets)?: None Difficulty moving from lying on back to sitting on the side of the bed? : None Difficulty sitting down on and standing up from a chair with arms (e.g., wheelchair, bedside commode, etc,.)?: None Help needed moving to and from a bed to chair (including a wheelchair)?: None Help needed walking in hospital room?: A Little Help needed climbing 3-5 steps  with a railing? : A Little 6 Click Score: 22    End of Session   Activity Tolerance: Patient tolerated treatment well Patient left: in chair;with call bell/phone within reach;with nursing/sitter in room Nurse Communication: Mobility status PT Visit Diagnosis: Muscle weakness (generalized) (M62.81);Unsteadiness on feet (R26.81);Difficulty in walking, not elsewhere classified (R26.2)     Time: 7078-6754 PT Time Calculation (min) (ACUTE ONLY): 25 min  Charges:  $Gait Training: 23-37 mins                    G Codes:       Alben Deeds, PT DPT  Board Certified Neurologic Specialist [place] 12/25/2017, 3:57 PM

## 2017-12-26 LAB — GLUCOSE, CAPILLARY
GLUCOSE-CAPILLARY: 154 mg/dL — AB (ref 65–99)
GLUCOSE-CAPILLARY: 179 mg/dL — AB (ref 65–99)
Glucose-Capillary: 141 mg/dL — ABNORMAL HIGH (ref 65–99)
Glucose-Capillary: 132 mg/dL — ABNORMAL HIGH (ref 65–99)
Glucose-Capillary: 172 mg/dL — ABNORMAL HIGH (ref 65–99)

## 2017-12-26 LAB — BASIC METABOLIC PANEL
Anion gap: 7 (ref 5–15)
BUN: 20 mg/dL (ref 6–20)
CHLORIDE: 105 mmol/L (ref 101–111)
CO2: 27 mmol/L (ref 22–32)
CREATININE: 0.83 mg/dL (ref 0.44–1.00)
Calcium: 9.1 mg/dL (ref 8.9–10.3)
GFR calc Af Amer: >60 mL/min (ref >60)
GFR calc non Af Amer: >60 mL/min (ref >60)
GLUCOSE: 163 mg/dL — AB (ref 65–99)
Potassium: 4.3 mmol/L (ref 3.5–5.1)
SODIUM: 139 mmol/L (ref 135–145)

## 2017-12-26 NOTE — Progress Notes (Signed)
PROGRESS NOTE    [name]  PRF:[id number] DOB: 09-12-1975 DOA: 11/20/2017 PCP: Sandi Mariscal, MD   Brief Narrative:  [name] Humphreyis a 42 y.o.femalewith a history of morbid obesity (BMI 56), IDDM, OSA, HTN, Cushing's disease and depression who presented 3/13 after suicide attempt by intentional overdose of baclofen, trazodone, and tramadol. She reportedly was encephalopathic and experienced a seizure, intubated for airway protection and admitted to the ICU. After extubation she developed stridor initially responsive to racemic epinephrine with laryngoscopy revealing fixed vocal cords, though when this recurred 3/21 it was unresponsive necessitating tracheostomy with laryngoscopy revealing circumferential subglottic swelling with necrotic debris and fibrinous exudate. Respiratory status has stabilized, though she continued to have significant secretions with odor. Clindamycin was started 3/27 and ENT reevaluated the patient 4/2 due to CT showing left paralaryngeal abscess. Flexible laryngoscopy was repeated showing pooled secretions in pharynx, and inability to assess vocal cord mobility or subglottic patency due to poor laryngeal airway. Psychiatry has continued to recommend transfer to inpatient psychiatry for suicidal ideation and attempt when medically stable.  Laryngoscopy repeated again 4/10, this time showed resolution of infection, stable subglottic granulation tissue.  Plan for longterm trach, likely repeat surgery in >4-6 months.  Trach team has been consulted.   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 -see discussion above -PCCM consulted and appreciated -ENT was consulted and appreciated, laryngoscopy on 12/18/2017 showed an infection had resolved -Patient was treated with 2 weeks of antibiotics including 4 days of clindamycin followed by 7 days of Zosyn and 3 days of Augmentin -ENT would like to follow-up with patient as  an outpatient in the next 3-6 months for possible repeat surgery of subglottic stenosis -Trach team continues to follow patient and teach patient -Wound care consulted for skin irritation around trach site; will discuss with wound care outpatient management -Currently on room air during the day and oxygen at night -Continue tessalon PRN for cough   Dyspnea with exertion  -likely due to airway plugging -resolved -Echocardiogram showed EF 55-60%  Intentional overdose/Suicidal ideation/Depression -continue Wellbutrin, trazodone as needed for insomnia -Psychiatry consulted and appreciated, recommended inpatient psychiatry admission  Acute kidney injury -Resolved  Diabetes mellitus, type II -Continue Lantus, insulin sliding scale, CBG monitoring  Seizure disorder -Continue gabapentin  Hypothyroidism -Continue Synthroid  Morbid obesity/Cushing's disease -Continue pro-stat, will need outpatient follow-up and monitoring  Essential hypertension -Continue Lasix, metoprolol  Obstructive sleep apnea -Continue supplemental oxygen at night, patient was previously on CPAP  DVT Prophylaxis  lovenox  Code Status: Full  Family Communication: None at bedside  Disposition Plan: Admitted. Pending inpatient psych admission- patient medically stable for discharge  Consultants ENT PCCM Psychiatry  Procedures  Intubation/extubation  Renal US LE Ultrasound Laryngoscopy  Trace placement  Antibiotics   Anti-infectives (From admission, onward)   Start     Dose/Rate Route Frequency Ordered Stop   12/19/17 1000  amoxicillin-clavulanate (AUGMENTIN) 875-125 MG per tablet 1 tablet     1 tablet Oral Every 12 hours 12/18/17 2117 12/22/17 2253   12/12/17 2200  piperacillin-tazobactam (ZOSYN) IVPB 3.375 g  Status:  Discontinued     3.375 g 12.5 mL/hr over 240 Minutes Intravenous Every 8 hours 12/12/17 1720 12/18/17 2117   12/12/17 1800  piperacillin-tazobactam (ZOSYN) IVPB 3.375 g      3.375 g 100 mL/hr over 30 Minutes Intravenous  Once 12/12/17 1720 12/12/17 2059   12/09/17 1400  clindamycin (CLEOCIN) capsule 300 mg  Status:  Discontinued  300 mg Oral Every 8 hours 12/09/17 1015 12/09/17 1016   12/09/17 1400  clindamycin (CLEOCIN) capsule 450 mg  Status:  Discontinued     450 mg Oral Every 8 hours 12/09/17 1016 12/12/17 1647   12/03/17 1800  clindamycin (CLEOCIN) IVPB 300 mg  Status:  Discontinued     300 mg 100 mL/hr over 30 Minutes Intravenous Every 6 hours 12/03/17 1458 12/09/17 1015      Subjective:   [name] seen and examined today.  Patient feeling much better today.  Denies current shortness of breath, chest pain, abdominal pain, nausea or vomiting, diarrhea or constipation.  Continues to have some cough.  Would like to take a shower.   Objective:   Vitals:   12/26/17 0241 12/26/17 0401 12/26/17 0851 12/26/17 0905  BP:  (!) 101/52  106/62  Pulse:  91 93 88  Resp:  17 18 20   Temp:  98 F (36.7 C)  97.9 F (36.6 C)  TempSrc:  Oral  Oral  SpO2:  90% 98% 97%  Weight: (!) 143.2 kg (315 lb 11.2 oz)     Height:        Intake/Output Summary (Last 24 hours) at 12/26/2017 1047 Last data filed at 12/26/2017 8250 Gross per 24 hour  Intake 1180 ml  Output 3 ml  Net 1177 ml   Filed Weights   12/24/17 0500 12/25/17 2100 12/26/17 0241  Weight: (!) 144.3 kg (318 lb 1.6 oz) (!) 143.2 kg (315 lb 11.2 oz) (!) 143.2 kg (315 lb 11.2 oz)   Exam  General: Well developed, well nourished, NAD, appears stated age  76: NCAT, mucous membranes moist.   Neck: Trach with granulation tissue and healing wounds  Cardiovascular: S1 S2 auscultated, RRR, no murmur  Respiratory: Clear to auscultation bilaterally with equal chest rise  Abdomen: Soft, obese, nontender, nondistended, + bowel sounds  Extremities: warm dry without cyanosis clubbing or edema  Neuro: AAOx3, nonfocal  Psych: Pleasant, appropriate mood and affect  Data Reviewed: I have personally  reviewed following labs and imaging studies  CBC: No results for input(s): WBC, NEUTROABS, HGB, HCT, MCV, PLT in the last 168 hours. Basic Metabolic Panel: Recent Labs  Lab 12/22/17 0259 12/23/17 0334 12/24/17 0657 12/25/17 0512 12/26/17 0502  NA 138 139 137 138 139  K 4.7 5.1 4.5 4.5 4.3  CL 103 103 102 101 105  CO2 26 26 26 27 27   GLUCOSE 164* 242* 205* 172* 163*  BUN 14 15 18 20 20   CREATININE 0.85 0.80 0.83 0.86 0.83  CALCIUM 9.2 9.4 9.4 9.2 9.1   GFR: Estimated Creatinine Clearance: 120.4 mL/min (by C-G formula based on SCr of 0.83 mg/dL). Liver Function Tests: No results for input(s): AST, ALT, ALKPHOS, BILITOT, PROT, ALBUMIN in the last 168 hours. No results for input(s): LIPASE, AMYLASE in the last 168 hours. No results for input(s): AMMONIA in the last 168 hours. Coagulation Profile: No results for input(s): INR, PROTIME in the last 168 hours. Cardiac Enzymes: No results for input(s): CKTOTAL, CKMB, CKMBINDEX, TROPONINI in the last 168 hours. BNP (last 3 results) No results for input(s): PROBNP in the last 8760 hours. HbA1C: No results for input(s): HGBA1C in the last 72 hours. CBG: Recent Labs  Lab 12/25/17 0620 12/25/17 1621 12/25/17 2129 12/26/17 0622 12/26/17 0811  GLUCAP 163* 221* 135* 141* 132*   Lipid Profile: No results for input(s): CHOL, HDL, LDLCALC, TRIG, CHOLHDL, LDLDIRECT in the last 72 hours. Thyroid Function Tests: No results for  input(s): TSH, T4TOTAL, FREET4, T3FREE, THYROIDAB in the last 72 hours. Anemia Panel: No results for input(s): VITAMINB12, FOLATE, FERRITIN, TIBC, IRON, RETICCTPCT in the last 72 hours. Urine analysis:    Component Value Date/Time   COLORURINE RED (A) 10/31/2016 2130   APPEARANCEUR CLOUDY (A) 10/31/2016 2130   LABSPEC 1.027 10/31/2016 2130   PHURINE 6.0 10/31/2016 2130   GLUCOSEU >=500 (A) 10/31/2016 2130   HGBUR LARGE (A) 10/31/2016 2130   BILIRUBINUR NEGATIVE 10/31/2016 2130   [place] NEGATIVE  10/31/2016 2130   PROTEINUR 100 (A) 10/31/2016 2130   UROBILINOGEN 0.2 04/09/2014 1157   NITRITE NEGATIVE 10/31/2016 2130   LEUKOCYTESUR NEGATIVE 10/31/2016 2130   Sepsis Labs: @LABRCNTIP (procalcitonin:4,lacticidven:4)  )No results found for this or any previous visit (from the past 240 hour(s)).    Radiology Studies: No results found.   Scheduled Meds: . buPROPion  100 mg Oral BID  . chlorhexidine  15 mL Mouth Rinse BID  . cholestyramine  4 g Oral QODAY  . enoxaparin (LOVENOX) injection  70 mg Subcutaneous Q24H  . feeding supplement (PRO-STAT SUGAR FREE 64)  30 mL Oral BID  . gabapentin  600 mg Oral BID  . Gerhardt's butt cream   Topical Daily  . insulin aspart  0-20 Units Subcutaneous TID WC  . insulin aspart  0-5 Units Subcutaneous QHS  . insulin aspart  8 Units Subcutaneous TID WC  . insulin glargine  40 Units Subcutaneous QHS  . levothyroxine  75 mcg Oral QAC breakfast  . mouth rinse  15 mL Mouth Rinse q12n4p  . metoprolol tartrate  12.5 mg Oral BID  . pantoprazole  40 mg Oral BID AC   Continuous Infusions:   LOS: 36 days   Time Spent in minutes   30 minutes  [name] D.O. on 12/26/2017 at 10:47 AM  Between 7am to 7pm - Pager - [phone]  After 7pm go to [email or web address] - password [email or web address]  And look for the night coverage person covering for me after hours  [facility] Office  [phone]

## 2017-12-26 NOTE — Care Management Note (Signed)
Case Management Note  Patient Details  Name: [name] MRN: [id number] Date of Birth: [birth year]  Subjective/Objective:                    Action/Plan: Pt is medically ready for transition to [facility]. Pt doing well with trach and site looks good. Dr A notified and CSW so information could be sent to [facility]. CM following.  Expected Discharge Date:                  Expected Discharge Plan:  [facility]  In-House Referral:     Discharge planning Services  CM Consult  Post Acute Care Choice:    Choice offered to:     DME Arranged:    DME Agency:     HH Arranged:    [place] Agency:     Status of Service:  In process, will continue to follow  If discussed at Long Length of Stay Meetings, dates discussed:    Additional Comments:  Pollie Friar, RN 12/26/2017, 3:06 PM

## 2017-12-26 NOTE — Consult Note (Signed)
[place] Nurse wound follow up Wound type:trach Patient complaining of itching to peristomal skin.  Aquacel Ag may be too drying.  Switch to Mepitel silicone contact layer around trach and cover with drain sponge daily.  Discourage patient picking at this.  Will not follow at this time.  Please re-consult if needed.  Domenic Moras RN BSN [place] Pager [phone]

## 2017-12-26 NOTE — Progress Notes (Signed)
Pt was able to demonstrate proper inner canula removal  And cleaning without any previous instruction from RT

## 2017-12-27 LAB — BASIC METABOLIC PANEL
ANION GAP: 10 (ref 5–15)
BUN: 19 mg/dL (ref 6–20)
CHLORIDE: 105 mmol/L (ref 101–111)
CO2: 25 mmol/L (ref 22–32)
Calcium: 9.5 mg/dL (ref 8.9–10.3)
Creatinine, Ser: 0.82 mg/dL (ref 0.44–1.00)
GFR calc Af Amer: >60 mL/min (ref >60)
GFR calc non Af Amer: >60 mL/min (ref >60)
GLUCOSE: 193 mg/dL — AB (ref 65–99)
POTASSIUM: 4.5 mmol/L (ref 3.5–5.1)
Sodium: 140 mmol/L (ref 135–145)

## 2017-12-27 LAB — GLUCOSE, CAPILLARY
GLUCOSE-CAPILLARY: 147 mg/dL — AB (ref 65–99)
GLUCOSE-CAPILLARY: 241 mg/dL — AB (ref 65–99)
Glucose-Capillary: 184 mg/dL — ABNORMAL HIGH (ref 65–99)
Glucose-Capillary: 199 mg/dL — ABNORMAL HIGH (ref 65–99)

## 2017-12-27 NOTE — Progress Notes (Signed)
PROGRESS NOTE    [name]  JQB:[id number] DOB: [birth year] DOA: 11/20/2017 PCP: Sandi Mariscal, MD   Brief Narrative:  [name] Humphreyis a 42 y.o.femalewith a history of morbid obesity (BMI 56), IDDM, OSA, HTN, Cushing's disease and depression who presented 3/13 after suicide attempt by intentional overdose of baclofen, trazodone, and tramadol. She reportedly was encephalopathic and experienced a seizure, intubated for airway protection and admitted to the ICU. After extubation she developed stridor initially responsive to racemic epinephrine with laryngoscopy revealing fixed vocal cords, though when this recurred 3/21 it was unresponsive necessitating tracheostomy with laryngoscopy revealing circumferential subglottic swelling with necrotic debris and fibrinous exudate. Respiratory status has stabilized, though she continued to have significant secretions with odor. Clindamycin was started 3/27 and ENT reevaluated the patient 4/2 due to CT showing left paralaryngeal abscess. Flexible laryngoscopy was repeated showing pooled secretions in pharynx, and inability to assess vocal cord mobility or subglottic patency due to poor laryngeal airway. Psychiatry has continued to recommend transfer to inpatient psychiatry for suicidal ideation and attempt when medically stable.  Laryngoscopy repeated again 4/10, this time showed resolution of infection, stable subglottic granulation tissue.  Plan for longterm trach, likely repeat surgery in >4-6 months.  Trach team has been consulted.   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 -[name] discussion above -PCCM consulted and appreciated -ENT was consulted and appreciated, laryngoscopy on 12/18/2017 showed an infection had resolved -Patient was treated with 2 weeks of antibiotics including 4 days of clindamycin followed by 7 days of Zosyn and 3 days of Augmentin -ENT would like to follow-up with patient as  an outpatient in the next 3-6 months for possible repeat surgery of subglottic stenosis -Trach team continues to follow patient and teach patient -Wound care consulted for skin irritation around trach site; discussed wound care- recommended Mepitel silicone contact layer around trach and cover with drain sponge daily -patient has been able to care for trach/inner canula removal and cleaning -Currently on room air during the day and oxygen at night -Continue tessalon PRN for cough   Dyspnea with exertion  -likely due to airway plugging -resolved -Echocardiogram showed EF 55-60%  Intentional overdose/Suicidal ideation/Depression -continue Wellbutrin, trazodone as needed for insomnia -Psychiatry consulted and appreciated, recommended inpatient psychiatry admission  Acute kidney injury -Resolved  Diabetes mellitus, type II -Continue Lantus, insulin sliding scale, CBG monitoring  Seizure disorder -Continue gabapentin  Hypothyroidism -Continue Synthroid  Morbid obesity/Cushing's disease -Continue pro-stat, will need outpatient follow-up and monitoring  Essential hypertension -Continue Lasix, metoprolol  Obstructive sleep apnea -Continue supplemental oxygen at night, patient was previously on CPAP  DVT Prophylaxis  lovenox  Code Status: Full  Family Communication: None at bedside  Disposition Plan: Admitted. Pending inpatient psych admission- patient medically stable for discharge  Consultants ENT PCCM Psychiatry  Procedures  Intubation/extubation  Renal US LE Ultrasound Laryngoscopy  Trace placement  Antibiotics   Anti-infectives (From admission, onward)   Start     Dose/Rate Route Frequency Ordered Stop   12/19/17 1000  amoxicillin-clavulanate (AUGMENTIN) 875-125 MG per tablet 1 tablet     1 tablet Oral Every 12 hours 12/18/17 2117 12/22/17 2253   12/12/17 2200  piperacillin-tazobactam (ZOSYN) IVPB 3.375 g  Status:  Discontinued     3.375 g 12.5 mL/hr over  240 Minutes Intravenous Every 8 hours 12/12/17 1720 12/18/17 2117   12/12/17 1800  piperacillin-tazobactam (ZOSYN) IVPB 3.375 g     3.375 g 100 mL/hr over 30 Minutes Intravenous  Once 12/12/17 1720 12/12/17 2059   12/09/17 1400  clindamycin (CLEOCIN) capsule 300 mg  Status:  Discontinued     300 mg Oral Every 8 hours 12/09/17 1015 12/09/17 1016   12/09/17 1400  clindamycin (CLEOCIN) capsule 450 mg  Status:  Discontinued     450 mg Oral Every 8 hours 12/09/17 1016 12/12/17 1647   12/03/17 1800  clindamycin (CLEOCIN) IVPB 300 mg  Status:  Discontinued     300 mg 100 mL/hr over 30 Minutes Intravenous Every 6 hours 12/03/17 1458 12/09/17 1015      Subjective:   [name] seen and examined today.  Starting to feel "closed in". Denies current chest pain, breath, abdominal pain, nausea or vomiting, diarrhea or constipation.  Has occasional cough.  She was able to take a shower yesterday.  Wishes she could go home.  Objective:   Vitals:   12/27/17 0400 12/27/17 0827 12/27/17 0828 12/27/17 0859  BP: (!) 114/56 (!) 110/58    Pulse: 86 84  86  Resp: 20   19  Temp: 98.2 F (36.8 C) 98.2 F (36.8 C) 98.2 F (36.8 C)   TempSrc: Oral Oral Oral   SpO2: 93% 96% 95% 96%  Weight:      Height:        Intake/Output Summary (Last 24 hours) at 12/27/2017 1108 Last data filed at 12/27/2017 0847 Gross per 24 hour  Intake 850 ml  Output -  Net 850 ml   Filed Weights   12/24/17 0500 12/25/17 2100 12/26/17 0241  Weight: (!) 144.3 kg (318 lb 1.6 oz) (!) 143.2 kg (315 lb 11.2 oz) (!) 143.2 kg (315 lb 11.2 oz)   Exam  General: Well developed, well nourished, NAD, appears stated age  HEENT: NCAT, mucous membranes moist.   Neck: Trach with granulation tissue and healing wounds  Cardiovascular: S1 S2 auscultated, no rubs, murmurs or gallops. Regular rate and rhythm.  Respiratory: diminished however clear, no wheezing, +occ cough  Abdomen: Soft, obese, nontender, nondistended, + bowel  sounds  Extremities: warm dry without cyanosis clubbing or edema  Neuro: AAOx3, nonfocal  Psych: Appropriate mood and affect, pleasant  Data Reviewed: I have personally reviewed following labs and imaging studies  CBC: No results for input(s): WBC, NEUTROABS, HGB, HCT, MCV, PLT in the last 168 hours. Basic Metabolic Panel: Recent Labs  Lab 12/23/17 0334 12/24/17 0657 12/25/17 0512 12/26/17 0502 12/27/17 0727  NA 139 137 138 139 140  K 5.1 4.5 4.5 4.3 4.5  CL 103 102 101 105 105  CO2 26 26 27 27 25   GLUCOSE 242* 205* 172* 163* 193*  BUN 15 18 20 20 19   CREATININE 0.80 0.83 0.86 0.83 0.82  CALCIUM 9.4 9.4 9.2 9.1 9.5   GFR: Estimated Creatinine Clearance: 121.9 mL/min (by C-G formula based on SCr of 0.82 mg/dL). Liver Function Tests: No results for input(s): AST, ALT, ALKPHOS, BILITOT, PROT, ALBUMIN in the last 168 hours. No results for input(s): LIPASE, AMYLASE in the last 168 hours. No results for input(s): AMMONIA in the last 168 hours. Coagulation Profile: No results for input(s): INR, PROTIME in the last 168 hours. Cardiac Enzymes: No results for input(s): CKTOTAL, CKMB, CKMBINDEX, TROPONINI in the last 168 hours. BNP (last 3 results) No results for input(s): PROBNP in the last 8760 hours. HbA1C: No results for input(s): HGBA1C in the last 72 hours. CBG: Recent Labs  Lab 12/26/17 1222 12/26/17 1732 12/26/17 2103 12/27/17 0636 12/27/17 1100  GLUCAP 172* 154* 179*  184* 199*   Lipid Profile: No results for input(s): CHOL, HDL, LDLCALC, TRIG, CHOLHDL, LDLDIRECT in the last 72 hours. Thyroid Function Tests: No results for input(s): TSH, T4TOTAL, FREET4, T3FREE, THYROIDAB in the last 72 hours. Anemia Panel: No results for input(s): VITAMINB12, FOLATE, FERRITIN, TIBC, IRON, RETICCTPCT in the last 72 hours. Urine analysis:    Component Value Date/Time   COLORURINE RED (A) 10/31/2016 2130   APPEARANCEUR CLOUDY (A) 10/31/2016 2130   LABSPEC 1.027 10/31/2016  2130   PHURINE 6.0 10/31/2016 2130   GLUCOSEU >=500 (A) 10/31/2016 2130   HGBUR LARGE (A) 10/31/2016 2130   BILIRUBINUR NEGATIVE 10/31/2016 2130   [place] NEGATIVE 10/31/2016 2130   PROTEINUR 100 (A) 10/31/2016 2130   UROBILINOGEN 0.2 04/09/2014 1157   NITRITE NEGATIVE 10/31/2016 2130   LEUKOCYTESUR NEGATIVE 10/31/2016 2130   Sepsis Labs: @LABRCNTIP (procalcitonin:4,lacticidven:4)  )No results found for this or any previous visit (from the past 240 hour(s)).    Radiology Studies: No results found.   Scheduled Meds: . buPROPion  100 mg Oral BID  . chlorhexidine  15 mL Mouth Rinse BID  . cholestyramine  4 g Oral QODAY  . enoxaparin (LOVENOX) injection  70 mg Subcutaneous Q24H  . feeding supplement (PRO-STAT SUGAR FREE 64)  30 mL Oral BID  . gabapentin  600 mg Oral BID  . Gerhardt's butt cream   Topical Daily  . insulin aspart  0-20 Units Subcutaneous TID WC  . insulin aspart  0-5 Units Subcutaneous QHS  . insulin aspart  8 Units Subcutaneous TID WC  . insulin glargine  40 Units Subcutaneous QHS  . levothyroxine  75 mcg Oral QAC breakfast  . mouth rinse  15 mL Mouth Rinse q12n4p  . metoprolol tartrate  12.5 mg Oral BID  . pantoprazole  40 mg Oral BID AC   Continuous Infusions:   LOS: 37 days   Time Spent in minutes   30 minutes  [name] D.O. on 12/27/2017 at 11:08 AM  Between 7am to 7pm - Pager - [phone]  After 7pm go to [email or web address] - password [email or web address]  And look for the night coverage person covering for me after hours  [facility] Office  [phone]

## 2017-12-27 NOTE — Progress Notes (Addendum)
CSW completed referral to [facility] for inpatient psychiatric treatment. [facility] authorization for [facility] #[id number]. CSW confirmed [facility] received referral and is under review. Will need to be IVC'd to go to [facility]. CSW to follow.  [name], [place]

## 2017-12-27 NOTE — Progress Notes (Signed)
Occupational Therapy Treatment and Discharge  Patient Details Name: RENAI LOPATA MRN: [id number] DOB: 12-Mar-1976 Today's Date: 12/27/2017    History of present illness Patient is a 42 y/o female who presents with drug OD and Sz. Intubated 3/13-3/15. resp failure with emergent trach 3/21 due to vocal cord dysfunction. CT of throat- suggests left paralaryngeal abscess. s/p Laryngoscopy 4/10. PMH includes ORIF RLE, depression, anxiety, morbid obesity, DM, Cushing's, Rt uni knee replacement, HTN, left ankle surgery, dyslipidemia, hypothyroidism. Underwent larangyscopy with trach downsized to 23m. 4/10.   OT comments  Pt is now able to perform ADLs modified independently.   She is eager to discharge.  All goals met.  Pt ready for discharge from OT - she agrees that goals were met, progress, and with being discharged.    Follow Up Recommendations  No OT follow up    Equipment Recommendations  None recommended by OT    Recommendations for Other Services      Precautions / Restrictions Precautions Precautions: Other (comment) Precaution Comments: trach       Mobility Bed Mobility Overal bed mobility: Modified Independent             General bed mobility comments: pt standing in room with NT  Transfers Overall transfer level: Modified independent Equipment used: None Transfers: Sit to/from Stand                Balance Overall balance assessment: Modified Independent Sitting-balance support: Feet supported;No upper extremity supported Sitting balance-Leahy Scale: Good     Standing balance support: During functional activity Standing balance-Leahy Scale: Good                             ADL either performed or assessed with clinical judgement   ADL Overall ADL's : Modified independent                                     Functional mobility during ADLs: Independent General ADL Comments: Pt reports she is now able to perform peri care      Vision       Perception     Praxis      Cognition Arousal/Alertness: Awake/alert Behavior During Therapy: WFL for tasks assessed/performed Overall Cognitive Status: Within Functional Limits for tasks assessed                                          Exercises     Shoulder Instructions       General Comments Pt is eager to discharge     Pertinent Vitals/ Pain       Pain Assessment: Faces Faces Pain Scale: Hurts little more Pain Location: feet and legs  Pain Descriptors / Indicators: Discomfort Pain Intervention(s): Monitored during session  Home Living                                          Prior Functioning/Environment              Frequency  Min 2X/week        Progress Toward Goals  OT Goals(current goals can now be found in the care plan section)  Progress  towards OT goals: Goals met/education completed, patient discharged from OT  Acute Rehab OT Goals Patient Stated Goal: to go home  Plan All goals met and education completed, patient discharged from OT services    Co-evaluation                 AM-PAC PT "6 Clicks" Daily Activity     Outcome Measure   Help from another person eating meals?: None Help from another person taking care of personal grooming?: None Help from another person toileting, which includes using toliet, bedpan, or urinal?: None Help from another person bathing (including washing, rinsing, drying)?: None Help from another person to put on and taking off regular upper body clothing?: None Help from another person to put on and taking off regular lower body clothing?: None 6 Click Score: 24    End of Session    OT Visit Diagnosis: Unsteadiness on feet (R26.81)   Activity Tolerance Patient tolerated treatment well   Patient Left in chair;with call bell/phone within reach;with nursing/sitter in room   Nurse Communication          Time: 2820-8138 OT Time Calculation  (min): 14 min  Charges: OT General Charges $OT Visit: 1 Visit OT Treatments $Self Care/Home Management : 8-22 mins  [facility], OTR/L [phone]    Lucille Passy M 12/27/2017, 4:19 PM

## 2017-12-27 NOTE — Plan of Care (Signed)
  Problem: Health Behavior/Discharge Planning: Goal: Ability to manage health-related needs will improve Outcome: Progressing   

## 2017-12-27 NOTE — Consult Note (Signed)
[facility] Psych Consult Progress Note  12/27/2017 1:11 PM [name]  MRN:  [id number] Subjective:  [name] was last seen on 4/15 and no medication changes were made. On interview today, she denies SI, HI or AVH. She reports no problems with sleep or appetite. She reports an improvement in her anxiety since her trach was changed. She is able to clean it on her own. She worries about the limitations she will now have due to having a tracheostomy. She enjoys swimming and wonders if she will still be able to swim with her trach. Her husband was present for interview with her permission. She was noted to become easily frustrated when interacting with her husband. Couples therapy and individual therapy were recommended following inpatient psychiatric treatment.    Principal Problem: MDD (major depressive disorder), recurrent episode, moderate ([place]) Diagnosis:   Patient Active Problem List   Diagnosis Date Noted  . MDD (major depressive disorder), recurrent episode, moderate ([place]) [F33.1]   . Acute respiratory failure with hypoxemia ([place]) [J96.01]   . SOB (shortness of breath) [R06.02]   . Tracheostomy status ([place]) [Z93.0]   . Hoarse voice quality [R49.0]   . Hypomagnesemia [E83.42]   . Shortness of breath [R06.02]   . Major depressive disorder, recurrent episode, severe ([place]) [F33.2]   . Intentional overdose of drug in tablet form ([place]) [T50.902A] 11/20/2017  . Intentional drug overdose ([place]) [T50.902A]   . Seizure ([place]) [R56.9]   . Acute respiratory failure with hypercapnia ([place]) [J96.02]   . Status epilepticus ([place]) [G40.901]   . 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 Abdominal pain, epigastric [R10.13]   . LUQ abdominal pain [R10.12]   . Nausea and vomiting [R11.2]   . Gastroparesis [K31.84]   . Hypertriglyceridemia [E78.1] 07/22/2015  . Diabetes mellitus due to underlying condition, uncontrolled, with diabetic neuropathy, with long-term  current use of insulin (HCC) [E08.40, E08.65, Z79.4]   . Vitamin D deficiency [E55.9] 07/19/2015  . 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 Osteoporosis [M81.0] 07/19/2015  . Periprosthetic fracture 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[place]), R tibial plateau  [M97.9XXA] 07/18/2015  . Fracture, tibial plateau [S82.143A] 07/16/2015  . Uncontrolled diabetes mellitus with diabetic neuropathy, with long-term current use of insulin ([place]) [E11.40, Z79.4, E11.65] 07/16/2015  . Leukocytosis [D72.829] 07/16/2015  . Essential hypertension [I10] 07/16/2015  . Morbid obesity ([place]) [E66.01] 04/20/2014  . Hyperlipidemia [E78.5] 07/31/2012  . Cushing disease ([place]) [E24.0] 07/31/2012  . Hypothyroid [E03.9] 07/31/2012   Total Time spent with patient: 15 minutes  Past Psychiatric History: MDD  Past Medical History:  Past Medical History:  Diagnosis Date  . Anxiety   . Complication of anesthesia    Pt. states takes long time to wake up from it.   . Cushing's disease ([place])   . Depression   . Diabetes ([place])   . Hyperlipidemia   . Hyperlipidemia   . Hypertension   . Morbid obesity ([place])   . Osteoporosis 07/19/2015  . Periprosthetic fracture 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[place]), R tibial plateau  07/18/2015  . Sleep apnea   . Uncontrolled diabetes mellitus with diabetic neuropathy, with long-term current use of insulin ([place]) 07/16/2015  . Vitamin D deficiency 07/19/2015    Past Surgical History:  Procedure Laterality Date  . ANTERIOR TALOFIBULAR LIGAMENT REPAIR Left 11/15/2014   Procedure: ANTERIOR TALOFIBULAR LIGAMENT REPAIR;  Surgeon: Jana Half, DPM;  Location: [place];  Service: Podiatry;  Laterality: Left;  . CESAREAN SECTION  dec 1997/  06-03-2001/   01-01-2005   BILATERAL TUBAL LIGATION WITH LAST ONE  . DILATION AND CURETTAGE OF UTERUS  1995   WITH SUCTION  . DIRECT LARYNGOSCOPY N/A 12/18/2017   Procedure: DIRECT  LARYNGOSCOPY;  Surgeon: Izora Gala, MD;  Location: [place];  Service: ENT;  Laterality: N/A;  . ESOPHAGOGASTRODUODENOSCOPY (EGD) WITH PROPOFOL N/A 10/04/2016   Procedure: ESOPHAGOGASTRODUODENOSCOPY (EGD) WITH PROPOFOL;  Surgeon: [name], MD;  Location: [facility];  Service: Endoscopy;  Laterality: N/A;  . LAPAROSCOPIC CHOLECYSTECTOMY  09-25-2005  . ORIF TIBIA PLATEAU Right 07/19/2015   Procedure: OPEN REDUCTION INTERNAL FIXATION (ORIF) RIGHT TIBIAL PLATEAU;  Surgeon: Altamese Bradley, MD;  Location: [place];  Service: Orthopedics;  Laterality: Right;  . PARTIAL KNEE ARTHROPLASTY Right 04/19/2014   Procedure: RIGHT UNI KNEE ARTHROPLASTY MEDIALLY ;  Surgeon: Mauri Pole, MD;  Location: [facility];  Service: Orthopedics;  Laterality: Right;  . TRACHEOSTOMY TUBE PLACEMENT N/A 11/28/2017   Procedure: TRACHEOSTOMY;  Surgeon: Izora Gala, MD;  Location: [place];  Service: ENT;  Laterality: N/A;  . TUBAL LIGATION     Family History:  Family History  Adopted: Yes  Problem Relation Age of Onset  . Autism Son   . ADD / ADHD Son   . Apraxia Son    Family Psychiatric  History: Son-autism and ADHD.  Social History:  Social History   Substance and Sexual Activity  Alcohol Use Yes   Comment: RARE     Social History   Substance and Sexual Activity  Drug Use No  . Types: Marijuana    Social History   Socioeconomic History  . Marital status: Married    Spouse name: [name]  . Number of children: 2  . Years of education: Not on file  . Highest education level: Not on file  Occupational History  . Not on file  Social Needs  . Financial resource strain: Not on file  . Food insecurity:    Worry: Not on file    Inability: Not on file  . Transportation needs:    Medical: Not on file    Non-medical: Not on file  Tobacco Use  . Smoking status: Current Every Day Smoker    Years: 4.00    Types: Cigarettes  . Smokeless tobacco: Never Used  Substance and Sexual Activity  . Alcohol use: Yes     Comment: RARE  . Drug use: No    Types: Marijuana  . Sexual activity: Yes    Partners: Male    Birth control/protection: Pill  Lifestyle  . Physical activity:    Days per week: Patient refused    Minutes per session: Patient refused  . Stress: Not on file  Relationships  . Social connections:    Talks on phone: Not on file    Gets together: Not on file    Attends religious service: Not on file    Active member of club or organization: Not on file    Attends meetings of clubs or organizations: Not on file    Relationship status: Not on file  Other Topics Concern  . Not on file  Social History Narrative  . Not on file    Sleep: Good  Appetite:  Good  Current Medications: Current Facility-Administered Medications  Medication Dose Route Frequency Provider Last Rate Last Dose  . acetaminophen (TYLENOL) tablet 650 mg  650 mg Oral Q6H PRN Bodenheimer, Charles A, NP   650 mg  at 12/25/17 0958  . albuterol (PROVENTIL) (2.5 MG/3ML) 0.083% nebulizer solution 2.5 mg  2.5 mg Nebulization Q2H PRN Edwin Dada, MD   2.5 mg at 12/22/17 1606  . benzonatate (TESSALON) capsule 100 mg  100 mg Oral TID PRN Cristal Ford, DO   100 mg at 12/26/17 3007  . buPROPion [facility]) tablet 100 mg  100 mg Oral BID Patrecia Pour, MD   100 mg at 12/27/17 0816  . chlorhexidine (PERIDEX) 0.12 % solution 15 mL  15 mL Mouth Rinse BID Cristal Ford, DO   15 mL at 12/27/17 0816  . cholestyramine (QUESTRAN) packet 4 g  4 g Oral Georgeanna Lea, MD   4 g at 12/23/17 0936  . enoxaparin (LOVENOX) injection 70 mg  70 mg Subcutaneous Q24H Dang, Thuy D, RPH   70 mg at 12/26/17 1705  . feeding supplement (PRO-STAT SUGAR FREE 64) liquid 30 mL  30 mL Oral BID Patrecia Pour, MD   30 mL at 12/27/17 0816  . fentaNYL (SUBLIMAZE) injection 25 mcg  25 mcg Intravenous Q4H PRN Nita Sells, MD   25 mcg at 12/18/17 1546  . gabapentin (NEURONTIN) capsule 600 mg  600 mg Oral BID Patrecia Pour, MD   600 mg  at 12/27/17 6226  . Gerhardt's butt cream   Topical Daily Vance Gather B, MD      . hydrALAZINE (APRESOLINE) injection 10 mg  10 mg Intravenous Q4H PRN Marijean Heath, NP   10 mg at 11/22/17 1358  . insulin aspart (novoLOG) injection 0-20 Units  0-20 Units Subcutaneous TID WC Nita Sells, MD   4 Units at 12/27/17 1137  . insulin aspart (novoLOG) injection 0-5 Units  0-5 Units Subcutaneous QHS Edwin Dada, MD   3 Units at 12/23/17 2233  . insulin aspart (novoLOG) injection 8 Units  8 Units Subcutaneous TID WC Patrecia Pour, MD   8 Units at 12/27/17 1138  . insulin glargine (LANTUS) injection 40 Units  40 Units Subcutaneous QHS Edwin Dada, MD   40 Units at 12/26/17 2132  . levothyroxine (SYNTHROID, LEVOTHROID) tablet 75 mcg  75 mcg Oral QAC breakfast Nita Sells, MD   75 mcg at 12/27/17 0816  . MEDLINE mouth rinse  15 mL Mouth Rinse q12n4p Mikhail, Lindsey, DO   15 mL at 12/24/17 1735  . menthol-cetylpyridinium (CEPACOL) lozenge 3 mg  1 lozenge Oral PRN Deterding, Guadelupe Sabin, MD   3 mg at 11/27/17 2336  . metoprolol tartrate (LOPRESSOR) tablet 12.5 mg  12.5 mg Oral BID Nita Sells, MD   12.5 mg at 12/27/17 0827  . oxyCODONE-acetaminophen (PERCOCET/ROXICET) 5-325 MG per tablet 1-2 tablet  1-2 tablet Oral Q4H PRN Nita Sells, MD   2 tablet at 12/25/17 2129  . pantoprazole (PROTONIX) EC tablet 40 mg  40 mg Oral BID AC Nita Sells, MD   40 mg at 12/27/17 0821  . phenol (CHLORASEPTIC) mouth spray 1 spray  1 spray Mouth/Throat PRN Cristal Ford, DO   1 spray at 11/27/17 2145  . sodium chloride flush (NS) 0.9 % injection 10-40 mL  10-40 mL Intracatheter PRN Patrecia Pour, MD   10 mL at 12/27/17 0847  . traZODone (DESYREL) tablet 50 mg  50 mg Oral QHS PRN Faythe Dingwall, DO   50 mg at 12/25/17 2127    Lab Results:  Results for orders placed or performed during the hospital encounter of 11/20/17 (from the past 48 hour(s))  Glucose, capillary     Status: Abnormal   Collection Time: 12/25/17  4:21 PM  Result Value Ref Range   Glucose-Capillary 221 (H) 65 - 99 mg/dL   Comment 1 Notify RN    Comment 2 Document in Chart   Glucose, capillary     Status: Abnormal   Collection Time: 12/25/17  9:29 PM  Result Value Ref Range   Glucose-Capillary 135 (H) 65 - 99 mg/dL   Comment 1 Notify RN    Comment 2 Document in Chart   Basic metabolic panel     Status: Abnormal   Collection Time: 12/26/17  5:02 AM  Result Value Ref Range   Sodium 139 135 - 145 mmol/L   Potassium 4.3 3.5 - 5.1 mmol/L   [place] 105 101 - 111 mmol/L   CO2 27 22 - 32 mmol/L   Glucose, Bld 163 (H) 65 - 99 mg/dL   BUN 20 6 - 20 mg/dL   Creatinine, Ser 0.83 0.44 - 1.00 mg/dL   Calcium 9.1 8.9 - 10.3 mg/dL   GFR calc non Af Amer >60 >60 mL/min   GFR calc Af Amer >60 >60 mL/min    Comment: (NOTE) The eGFR has been calculated using the CKD EPI equation. This calculation has not been validated in all clinical situations. eGFR's persistently <60 mL/min signify possible Chronic Kidney Disease.    Anion gap 7 5 - 15    Comment: Performed at [facility] [address], [place], [place] [address]  Glucose, capillary     Status: Abnormal   Collection Time: 12/26/17  6:22 AM  Result Value Ref Range   Glucose-Capillary 141 (H) 65 - 99 mg/dL   Comment 1 Notify RN    Comment 2 Document in Chart   Glucose, capillary     Status: Abnormal   Collection Time: 12/26/17  8:11 AM  Result Value Ref Range   Glucose-Capillary 132 (H) 65 - 99 mg/dL   Comment 1 Document in Chart   Glucose, capillary     Status: Abnormal   Collection Time: 12/26/17 12:22 PM  Result Value Ref Range   Glucose-Capillary 172 (H) 65 - 99 mg/dL   Comment 1 Document in Chart   Glucose, capillary     Status: Abnormal   Collection Time: 12/26/17  5:32 PM  Result Value Ref Range   Glucose-Capillary 154 (H) 65 - 99 mg/dL  Glucose, capillary     Status: Abnormal   Collection  Time: 12/26/17  9:03 PM  Result Value Ref Range   Glucose-Capillary 179 (H) 65 - 99 mg/dL  Glucose, capillary     Status: Abnormal   Collection Time: 12/27/17  6:36 AM  Result Value Ref Range   Glucose-Capillary 184 (H) 65 - 99 mg/dL  Basic metabolic panel     Status: Abnormal   Collection Time: 12/27/17  7:27 AM  Result Value Ref Range   Sodium 140 135 - 145 mmol/L   Potassium 4.5 3.5 - 5.1 mmol/L   Chloride 105 101 - 111 mmol/L   CO2 25 22 - 32 mmol/L   Glucose, Bld 193 (H) 65 - 99 mg/dL   BUN 19 6 - 20 mg/dL   Creatinine, Ser 0.82 0.44 - 1.00 mg/dL   Calcium 9.5 8.9 - 10.3 mg/dL   GFR calc non Af Amer >60 >60 mL/min   GFR calc Af Amer >60 >60 mL/min    Comment: (NOTE) The eGFR has been calculated using the CKD EPI  equation. This calculation has not been validated in all clinical situations. eGFR's persistently <60 mL/min signify possible Chronic Kidney Disease.    Anion gap 10 5 - 15    Comment: Performed at [facility] [address], [place], [place] [address]  Glucose, capillary     Status: Abnormal   Collection Time: 12/27/17 11:00 AM  Result Value Ref Range   Glucose-Capillary 199 (H) 65 - 99 mg/dL    Blood Alcohol level:  Lab Results  Component Value Date   ETH <10 11/20/2017    Musculoskeletal: Strength & Muscle Tone: within normal limits Gait & Station: UTA since patient was sitting in a chair.  Patient leans: N/A  Psychiatric Specialty Exam: Physical Exam  Nursing note and vitals reviewed. Constitutional: She is oriented to person, place, and time. She appears well-developed and well-nourished.  HENT:  Head: Normocephalic and atraumatic.  Neck: Normal range of motion.  Respiratory: Effort normal.  Musculoskeletal: Normal range of motion.  Neurological: She is alert and oriented to person, place, and time.  Psychiatric: She has a normal mood and affect. Her behavior is normal. Judgment and thought content normal.    Review of Systems   Musculoskeletal:       Bilateral foot pain secondary to edema.   Psychiatric/Behavioral: Negative for hallucinations, substance abuse and suicidal ideas. The patient does not have insomnia.   All other systems reviewed and are negative.   Blood pressure (!) 110/58, pulse 92, temperature 98.2 F (36.8 C), temperature source Oral, resp. rate 20, height 5' 2"  (1.575 m), weight (!) 143.2 kg (315 lb 11.2 oz), SpO2 98 %.Body mass index is 57.74 kg/m.  General Appearance: Fairly Groomed, middle aged, morbidly obese, Caucasian female, wearing casual clothes and sitting in a chair. NAD.   Eye Contact:  Good  Speech:  Clear and Coherent and Normal Rate  Volume:  Normal  Mood:  "Okay"  Affect:  Full Range but intermittently dysphoric when discussing her stressors.  Thought Process:  Goal Directed, Linear and Descriptions of Associations: Intact  Orientation:  Full (Time, Place, and Person)  Thought Content:  Logical  Suicidal Thoughts:  No  Homicidal Thoughts:  No  Memory:  Immediate;   Good Recent;   Good Remote;   Good  Judgement:  Fair  Insight:  Fair  Psychomotor Activity:  Normal  Concentration:  Concentration: Fair and Attention Span: Fair  Recall:  Good  Fund of Knowledge:  Good  Language:  Good  Akathisia:  No  Handed:  Right  AIMS (if indicated):   N/A  Assets:  Communication Skills Desire for Improvement Housing Intimacy Social Support  ADL's:  Intact  Cognition:  WNL  Sleep:   Okay    Assessment:  [name] is a 42 y.o. female who was admitted for suicide attempt by drug overdose in the setting of depression and family stressors. She was last seen on 4/15. She continues to deny SI, HI or AVH. She continues to warrant inpatient psychiatric hospitalization due to high risk of harm to self given severe suicide attempt and poor frustration tolerance/impulsivity in the setting of stressors with inability to have a viable safety plan. Her husband reports that he does not  feel like she is safe to return home.   Treatment Plan Summary: -Patient warrants inpatient psychiatric hospitalization given high risk of harm to self. -Continue Engineer, materials.  -Continue home medications: Wellbutrin 100 mg BID for depression, Trazodone 50 mg qhs PRN for insomnia  and Gabapentin 600 mg BID for anxiety.  -Start Atarax 25-50 mg TID PRN for anxiety.  -Please pursue involuntary commitment if patient refuses voluntary psychiatric hospitalization or attempts to leave the hospital.  -Psychiatry will continue to follow patient as needed.     Faythe Dingwall, DO 12/27/2017, 1:11 PM

## 2017-12-27 NOTE — Progress Notes (Signed)
Physical Therapy Treatment Patient Details Name: [name] MRN: [id number] DOB: [birth year] Today's Date: 12/27/2017    History of Present Illness Patient is a 42 y/o female who presents with drug OD and Sz. Intubated 3/13-3/15. resp failure with emergent trach 3/21 due to vocal cord dysfunction. CT of throat- suggests left paralaryngeal abscess. s/p Laryngoscopy 4/10. PMH includes ORIF RLE, depression, anxiety, morbid obesity, DM, Cushing's, Rt uni knee replacement, HTN, left ankle surgery, dyslipidemia, hypothyroidism. Underwent larangyscopy with trach downsized to 61mm. 4/10.    PT Comments    Patient tolerated session well and very much enjoys getting out of hospital room. Pt was able to ambulate 546ft with supervision and standing rest breaks due to 2/4 DOE. SpO2 90% or > on RA. Continue to progress as tolerated.    Follow Up Recommendations  Supervision for mobility/OOB;Other (comment)(plan for inpatient psych)     Equipment Recommendations  None recommended by PT    Recommendations for Other Services OT consult     Precautions / Restrictions Precautions Precautions: Other (comment) Precaution Comments: trach    Mobility  Bed Mobility               General bed mobility comments: pt standing in room with NT  Transfers Overall transfer level: Modified independent Equipment used: None Transfers: Sit to/from Stand              Ambulation/Gait Ambulation/Gait assistance: Supervision Ambulation Distance (Feet): 500 Feet Assistive device: None Gait Pattern/deviations: Step-through pattern;Wide base of support;Decreased stride length;Drifts right/left Gait velocity: decreased   General Gait Details: slow gait with wide BOS; lateral sway and single UE support at times however no LOB; 2/4 DOE at times requiring standing rest breaks; SpO2 90% or > on RA   Stairs             Wheelchair Mobility    Modified Rankin (Stroke Patients Only)        Balance Overall balance assessment: Needs assistance Sitting-balance support: Feet supported;No upper extremity supported Sitting balance-Leahy Scale: Good     Standing balance support: During functional activity Standing balance-Leahy Scale: Good                              Cognition Arousal/Alertness: Awake/alert Behavior During Therapy: WFL for tasks assessed/performed Overall Cognitive Status: Within Functional Limits for tasks assessed                                        Exercises      General Comments        Pertinent Vitals/Pain Pain Assessment: Faces Faces Pain Scale: Hurts a little bit Pain Location: feet Pain Descriptors / Indicators: Discomfort Pain Intervention(s): Monitored during session    Home Living                      Prior Function            PT Goals (current goals can now be found in the care plan section) Acute Rehab PT Goals Patient Stated Goal: to go home PT Goal Formulation: With patient Time For Goal Achievement: 01/02/18 Potential to Achieve Goals: Good Progress towards PT goals: Progressing toward goals    Frequency    Min 3X/week      PT Plan Current plan remains appropriate    Co-evaluation  AM-PAC PT "6 Clicks" Daily Activity  Outcome Measure  Difficulty turning over in bed (including adjusting bedclothes, sheets and blankets)?: None Difficulty moving from lying on back to sitting on the side of the bed? : None Difficulty sitting down on and standing up from a chair with arms (e.g., wheelchair, bedside commode, etc,.)?: None Help needed moving to and from a bed to chair (including a wheelchair)?: None Help needed walking in hospital room?: A Little Help needed climbing 3-5 steps with a railing? : A Little 6 Click Score: 22    End of Session Equipment Utilized During Treatment: Gait belt Activity Tolerance: Patient tolerated treatment well Patient left: in  chair;with call bell/phone within reach;with nursing/sitter in room Nurse Communication: Mobility status PT Visit Diagnosis: Muscle weakness (generalized) (M62.81);Unsteadiness on feet (R26.81);Difficulty in walking, not elsewhere classified (R26.2)     Time: 8937-3428 PT Time Calculation (min) (ACUTE ONLY): 26 min  Charges:  $Gait Training: 23-37 mins                    G Codes:       Earney Navy, PTA Pager: [phone]     Darliss Cheney 12/27/2017, 1:22 PM

## 2017-12-28 LAB — GLUCOSE, CAPILLARY
GLUCOSE-CAPILLARY: 177 mg/dL — AB (ref 65–99)
Glucose-Capillary: 165 mg/dL — ABNORMAL HIGH (ref 65–99)
Glucose-Capillary: 222 mg/dL — ABNORMAL HIGH (ref 65–99)
Glucose-Capillary: 241 mg/dL — ABNORMAL HIGH (ref 65–99)
Glucose-Capillary: 170 mg/dL — ABNORMAL HIGH (ref 65–99)

## 2017-12-28 MED ORDER — HYDROXYZINE HCL 25 MG PO TABS
25.0000 mg | ORAL_TABLET | Freq: Three times a day (TID) | ORAL | Status: DC | PRN
  Administered 2018-01-06: 25 mg via ORAL
  Filled 2017-12-28: qty 1

## 2017-12-28 MED ORDER — INSULIN GLARGINE 100 UNIT/ML ~~LOC~~ SOLN
42.0000 [IU] | Freq: Every day | SUBCUTANEOUS | Status: DC
  Administered 2017-12-28 – 2018-01-06 (×10): 42 [IU] via SUBCUTANEOUS
  Filled 2017-12-28 (×10): qty 0.42

## 2017-12-28 NOTE — Progress Notes (Signed)
PROGRESS NOTE    [name]  MPN:[id number] DOB: 28-Jan-1976 DOA: 11/20/2017 PCP: Sandi Mariscal, MD   Brief Narrative:  [name] Humphreyis a 42 y.o.femalewith a history of morbid obesity (BMI 56), IDDM, OSA, HTN, Cushing's disease and depression who presented 3/13 after suicide attempt by intentional overdose of baclofen, trazodone, and tramadol. She reportedly was encephalopathic and experienced a seizure, intubated for airway protection and admitted to the ICU. After extubation she developed stridor initially responsive to racemic epinephrine with laryngoscopy revealing fixed vocal cords, though when this recurred 3/21 it was unresponsive necessitating tracheostomy with laryngoscopy revealing circumferential subglottic swelling with necrotic debris and fibrinous exudate. Respiratory status has stabilized, though she continued to have significant secretions with odor. Clindamycin was started 3/27 and ENT reevaluated the patient 4/2 due to CT showing left paralaryngeal abscess. Flexible laryngoscopy was repeated showing pooled secretions in pharynx, and inability to assess vocal cord mobility or subglottic patency due to poor laryngeal airway. Psychiatry has continued to recommend transfer to inpatient psychiatry for suicidal ideation and attempt when medically stable.  Laryngoscopy repeated again 4/10, this time showed resolution of infection, stable subglottic granulation tissue.  Plan for longterm trach, likely repeat surgery in >4-6 months.  Trach team has been consulted.   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 -see discussion above -PCCM consulted and appreciated -ENT was consulted and appreciated, laryngoscopy on 12/18/2017 showed an infection had resolved -Patient was treated with 2 weeks of antibiotics including 4 days of clindamycin followed by 7 days of Zosyn and 3 days of Augmentin -ENT would like to follow-up with patient as  an outpatient in the next 3-6 months for possible repeat surgery of subglottic stenosis -Trach team continues to follow patient and teach patient -Wound care consulted for skin irritation around trach site; discussed wound care- recommended Mepitel silicone contact layer around trach and cover with drain sponge daily -patient has been able to care for trach/inner canula removal and cleaning -Currently on room air during the day and oxygen at night -Continue tessalon PRN for cough   Dyspnea with exertion  -likely due to airway plugging -resolved -Echocardiogram showed EF 55-60%  Intentional overdose/Suicidal ideation/Depression -continue Wellbutrin, trazodone as needed for insomnia -Psychiatry consulted and appreciated, recommended inpatient psychiatry admission -psychiatry recommended Atarax 25-50mg  TID PRN for anxiety -have started Atarax 25mg  TID PRN  Acute kidney injury -Resolved  Diabetes mellitus, type II -Continue Lantus, 8u novolong TID, insulin sliding scale, CBG monitoring -will change lantus to 42u daily due to uncontrolled CBG  Seizure disorder -Continue gabapentin  Hypothyroidism -Continue Synthroid  Morbid obesity/Cushing's disease -Continue pro-stat, will need outpatient follow-up and monitoring  Essential hypertension -Continue Lasix, metoprolol  Obstructive sleep apnea -Continue supplemental oxygen at night, patient was previously on CPAP  DVT Prophylaxis  lovenox  Code Status: Full  Family Communication: None at bedside  Disposition Plan: Admitted. Pending inpatient psych admission- patient medically stable for discharge  Consultants ENT PCCM Psychiatry  Procedures  Intubation/extubation  Renal US LE Ultrasound Laryngoscopy  Trace placement  Antibiotics   Anti-infectives (From admission, onward)   Start     Dose/Rate Route Frequency Ordered Stop   12/19/17 1000  amoxicillin-clavulanate (AUGMENTIN) 875-125 MG per tablet 1 tablet     1  tablet Oral Every 12 hours 12/18/17 2117 12/22/17 2253   12/12/17 2200  piperacillin-tazobactam (ZOSYN) IVPB 3.375 g  Status:  Discontinued     3.375 g 12.5 mL/hr over 240 Minutes Intravenous Every 8 hours  12/12/17 1720 12/18/17 2117   12/12/17 1800  piperacillin-tazobactam (ZOSYN) IVPB 3.375 g     3.375 g 100 mL/hr over 30 Minutes Intravenous  Once 12/12/17 1720 12/12/17 2059   12/09/17 1400  clindamycin (CLEOCIN) capsule 300 mg  Status:  Discontinued     300 mg Oral Every 8 hours 12/09/17 1015 12/09/17 1016   12/09/17 1400  clindamycin (CLEOCIN) capsule 450 mg  Status:  Discontinued     450 mg Oral Every 8 hours 12/09/17 1016 12/12/17 1647   12/03/17 1800  clindamycin (CLEOCIN) IVPB 300 mg  Status:  Discontinued     300 mg 100 mL/hr over 30 Minutes Intravenous Every 6 hours 12/03/17 1458 12/09/17 1015      Subjective:   Addysin Bashor seen and examined today. Worried about her blood sugars being high. Denies current chest pain, shortness of breath, abdominal pain, N/V/D/C.   Objective:   Vitals:   12/28/17 0338 12/28/17 0700 12/28/17 0939 12/28/17 0958  BP: 103/64  (!) 112/54   Pulse: 88  86   Resp: (!) 22     Temp: 98.2 F (36.8 C)     TempSrc: Oral     SpO2: (!) 86% 93% 96% 96%  Weight:      Height:        Intake/Output Summary (Last 24 hours) at 12/28/2017 1101 Last data filed at 12/28/2017 0900 Gross per 24 hour  Intake 970 ml  Output -  Net 970 ml   Filed Weights   12/24/17 0500 12/25/17 2100 12/26/17 0241  Weight: (!) 144.3 kg (318 lb 1.6 oz) (!) 143.2 kg (315 lb 11.2 oz) (!) 143.2 kg (315 lb 11.2 oz)   Exam  General: Well developed, well nourished, NAD, appears stated age  HEENT: NCAT, mucous membranes moist.   Neck: Trach with granulation tissue and healing wounds  Extremities: warm dry without cyanosis clubbing or edema  Neuro: AAOx3, nonfocal  Psych: Pleasant, appropriate mood and affect  Data Reviewed: I have personally reviewed following labs and  imaging studies  CBC: No results for input(s): WBC, NEUTROABS, HGB, HCT, MCV, PLT in the last 168 hours. Basic Metabolic Panel: Recent Labs  Lab 12/23/17 0334 12/24/17 0657 12/25/17 0512 12/26/17 0502 12/27/17 0727  NA 139 137 138 139 140  K 5.1 4.5 4.5 4.3 4.5  CL 103 102 101 105 105  CO2 26 26 27 27 25   GLUCOSE 242* 205* 172* 163* 193*  BUN 15 18 20 20 19   CREATININE 0.80 0.83 0.86 0.83 0.82  CALCIUM 9.4 9.4 9.2 9.1 9.5   GFR: Estimated Creatinine Clearance: 121.9 mL/min (by C-G formula based on SCr of 0.82 mg/dL). Liver Function Tests: No results for input(s): AST, ALT, ALKPHOS, BILITOT, PROT, ALBUMIN in the last 168 hours. No results for input(s): LIPASE, AMYLASE in the last 168 hours. No results for input(s): AMMONIA in the last 168 hours. Coagulation Profile: No results for input(s): INR, PROTIME in the last 168 hours. Cardiac Enzymes: No results for input(s): CKTOTAL, CKMB, CKMBINDEX, TROPONINI in the last 168 hours. BNP (last 3 results) No results for input(s): PROBNP in the last 8760 hours. HbA1C: No results for input(s): HGBA1C in the last 72 hours. CBG: Recent Labs  Lab 12/27/17 1719 12/27/17 2149 12/27/17 2341 12/28/17 0329 12/28/17 0953  GLUCAP 147* 241* 222* 165* 241*   Lipid Profile: No results for input(s): CHOL, HDL, LDLCALC, TRIG, CHOLHDL, LDLDIRECT in the last 72 hours. Thyroid Function Tests: No results for input(s): TSH, T4TOTAL,  FREET4, T3FREE, THYROIDAB in the last 72 hours. Anemia Panel: No results for input(s): VITAMINB12, FOLATE, FERRITIN, TIBC, IRON, RETICCTPCT in the last 72 hours. Urine analysis:    Component Value Date/Time   COLORURINE RED (A) 10/31/2016 2130   APPEARANCEUR CLOUDY (A) 10/31/2016 2130   LABSPEC 1.027 10/31/2016 2130   PHURINE 6.0 10/31/2016 2130   GLUCOSEU >=500 (A) 10/31/2016 2130   HGBUR LARGE (A) 10/31/2016 2130   BILIRUBINUR NEGATIVE 10/31/2016 2130   [place] NEGATIVE 10/31/2016 2130   PROTEINUR 100 (A)  10/31/2016 2130   UROBILINOGEN 0.2 04/09/2014 1157   NITRITE NEGATIVE 10/31/2016 2130   LEUKOCYTESUR NEGATIVE 10/31/2016 2130   Sepsis Labs: @LABRCNTIP (procalcitonin:4,lacticidven:4)  )No results found for this or any previous visit (from the past 240 hour(s)).    Radiology Studies: No results found.   Scheduled Meds: . buPROPion  100 mg Oral BID  . chlorhexidine  15 mL Mouth Rinse BID  . cholestyramine  4 g Oral QODAY  . enoxaparin (LOVENOX) injection  70 mg Subcutaneous Q24H  . feeding supplement (PRO-STAT SUGAR FREE 64)  30 mL Oral BID  . gabapentin  600 mg Oral BID  . Gerhardt's butt cream   Topical Daily  . insulin aspart  0-20 Units Subcutaneous TID WC  . insulin aspart  0-5 Units Subcutaneous QHS  . insulin aspart  8 Units Subcutaneous TID WC  . insulin glargine  42 Units Subcutaneous QHS  . levothyroxine  75 mcg Oral QAC breakfast  . mouth rinse  15 mL Mouth Rinse q12n4p  . metoprolol tartrate  12.5 mg Oral BID  . pantoprazole  40 mg Oral BID AC   Continuous Infusions:   LOS: 38 days   Time Spent in minutes   30 minutes  Corrado Hymon D.O. on 12/28/2017 at 11:01 AM  Between 7am to 7pm - Pager - [phone]  After 7pm go to [email or web address] - password [email or web address]  And look for the night coverage person covering for me after hours  [facility] Office  [phone]

## 2017-12-29 LAB — GLUCOSE, CAPILLARY
GLUCOSE-CAPILLARY: 193 mg/dL — AB (ref 65–99)
Glucose-Capillary: 163 mg/dL — ABNORMAL HIGH (ref 65–99)
Glucose-Capillary: 152 mg/dL — ABNORMAL HIGH (ref 65–99)
Glucose-Capillary: 156 mg/dL — ABNORMAL HIGH (ref 65–99)

## 2017-12-29 NOTE — Plan of Care (Signed)
  Problem: Education: Goal: Knowledge of General Education information will improve Outcome: Progressing   

## 2017-12-29 NOTE — Progress Notes (Signed)
PROGRESS NOTE    [name]  JJO:[id number] DOB: [birth year] DOA: 11/20/2017 PCP: Sandi Mariscal, MD   Brief Narrative:  [name] Humphreyis a 42 y.o.femalewith a history of morbid obesity (BMI 56), IDDM, OSA, HTN, Cushing's disease and depression who presented 3/13 after suicide attempt by intentional overdose of baclofen, trazodone, and tramadol. She reportedly was encephalopathic and experienced a seizure, intubated for airway protection and admitted to the ICU. After extubation she developed stridor initially responsive to racemic epinephrine with laryngoscopy revealing fixed vocal cords, though when this recurred 3/21 it was unresponsive necessitating tracheostomy with laryngoscopy revealing circumferential subglottic swelling with necrotic debris and fibrinous exudate. Respiratory status has stabilized, though she continued to have significant secretions with odor. Clindamycin was started 3/27 and ENT reevaluated the patient 4/2 due to CT showing left paralaryngeal abscess. Flexible laryngoscopy was repeated showing pooled secretions in pharynx, and inability to assess vocal cord mobility or subglottic patency due to poor laryngeal airway. Psychiatry has continued to recommend transfer to inpatient psychiatry for suicidal ideation and attempt when medically stable.  Laryngoscopy repeated again 4/10, this time showed resolution of infection, stable subglottic granulation tissue.  Plan for longterm trach, likely repeat surgery in >4-6 months.  Trach team has been consulted.   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 -see discussion above -PCCM consulted and appreciated -ENT was consulted and appreciated, laryngoscopy on 12/18/2017 showed an infection had resolved -Patient was treated with 2 weeks of antibiotics including 4 days of clindamycin followed by 7 days of Zosyn and 3 days of Augmentin -ENT would like to follow-up with patient as  an outpatient in the next 3-6 months for possible repeat surgery of subglottic stenosis -Trach team continues to follow patient and teach patient -Wound care consulted for skin irritation around trach site; discussed wound care- recommended Mepitel silicone contact layer around trach and cover with drain sponge daily -patient has been able to care for trach/inner canula removal and cleaning -Currently on room air during the day and oxygen at night -Continue tessalon PRN for cough   Dyspnea with exertion  -likely due to airway plugging -resolved -Echocardiogram showed EF 55-60%  Intentional overdose/Suicidal ideation/Depression -continue Wellbutrin, trazodone as needed for insomnia -Psychiatry consulted and appreciated, recommended inpatient psychiatry admission -psychiatry recommended Atarax 25-50mg  TID PRN for anxiety -have started Atarax 25mg  TID PRN  Acute kidney injury -Resolved  Diabetes mellitus, type II -Continue Lantus 42u, 8u novolog TID, insulin sliding scale, CBG monitoring -CBG better controlled  Seizure disorder -Continue gabapentin  Hypothyroidism -Continue Synthroid  Morbid obesity/Cushing's disease -Continue pro-stat, will need outpatient follow-up and monitoring  Essential hypertension -Continue Lasix, metoprolol  Obstructive sleep apnea -Continue supplemental oxygen at night, patient was previously on CPAP  DVT Prophylaxis  lovenox  Code Status: Full  Family Communication: None at bedside  Disposition Plan: Admitted. Pending inpatient psych admission- patient medically stable for discharge  Consultants ENT PCCM Psychiatry  Procedures  Intubation/extubation  Renal US LE Ultrasound Laryngoscopy  Trace placement  Antibiotics   Anti-infectives (From admission, onward)   Start     Dose/Rate Route Frequency Ordered Stop   12/19/17 1000  amoxicillin-clavulanate (AUGMENTIN) 875-125 MG per tablet 1 tablet     1 tablet Oral Every 12 hours  12/18/17 2117 12/22/17 2253   12/12/17 2200  piperacillin-tazobactam (ZOSYN) IVPB 3.375 g  Status:  Discontinued     3.375 g 12.5 mL/hr over 240 Minutes Intravenous Every 8 hours 12/12/17 1720 12/18/17 2117  12/12/17 1800  piperacillin-tazobactam (ZOSYN) IVPB 3.375 g     3.375 g 100 mL/hr over 30 Minutes Intravenous  Once 12/12/17 1720 12/12/17 2059   12/09/17 1400  clindamycin (CLEOCIN) capsule 300 mg  Status:  Discontinued     300 mg Oral Every 8 hours 12/09/17 1015 12/09/17 1016   12/09/17 1400  clindamycin (CLEOCIN) capsule 450 mg  Status:  Discontinued     450 mg Oral Every 8 hours 12/09/17 1016 12/12/17 1647   12/03/17 1800  clindamycin (CLEOCIN) IVPB 300 mg  Status:  Discontinued     300 mg 100 mL/hr over 30 Minutes Intravenous Every 6 hours 12/03/17 1458 12/09/17 1015      Subjective:   [name] seen and examined today. Patient upset with her current situation. Feels she can go home and manage. Denies current chest pain, shortness of breath, abdominal pain, N/V/D/C.   Objective:   Vitals:   12/29/17 0525 12/29/17 0744 12/29/17 0749 12/29/17 0954  BP: (!) 118/55 111/66 111/66   Pulse: 92 87 83   Resp: 18 20    Temp: 98.5 F (36.9 C) 98.1 F (36.7 C) 98.1 F (36.7 C)   TempSrc: Oral Oral Oral   SpO2: 99% 97% 98% 98%  Weight:      Height:        Intake/Output Summary (Last 24 hours) at 12/29/2017 1213 Last data filed at 12/29/2017 0930 Gross per 24 hour  Intake 924 ml  Output -  Net 924 ml   Filed Weights   12/25/17 2100 12/26/17 0241 12/29/17 0100  Weight: (!) 143.2 kg (315 lb 11.2 oz) (!) 143.2 kg (315 lb 11.2 oz) (!) 141.7 kg (312 lb 6.3 oz)   Exam  General: Well developed, well nourished, NAD, appears stated age  62: NCAT, mucous membranes moist.   Neck: Trach  Cardiovascular: S1 S2 auscultated, RRR, no murmur  Respiratory: Clear to auscultation bilaterally with equal chest rise, +occ cough  Abdomen: Soft, obese, nontender, nondistended, +  bowel sounds  Extremities: warm dry without cyanosis clubbing or edema  Neuro: AAOx3, nonfocal  Psych: Pleasant, appropriate mood and affect  Data Reviewed: I have personally reviewed following labs and imaging studies  CBC: No results for input(s): WBC, NEUTROABS, HGB, HCT, MCV, PLT in the last 168 hours. Basic Metabolic Panel: Recent Labs  Lab 12/23/17 0334 12/24/17 0657 12/25/17 0512 12/26/17 0502 12/27/17 0727  NA 139 137 138 139 140  K 5.1 4.5 4.5 4.3 4.5  CL 103 102 101 105 105  CO2 26 26 27 27 25   GLUCOSE 242* 205* 172* 163* 193*  BUN 15 18 20 20 19   CREATININE 0.80 0.83 0.86 0.83 0.82  CALCIUM 9.4 9.4 9.2 9.1 9.5   GFR: Estimated Creatinine Clearance: 121.1 mL/min (by C-G formula based on SCr of 0.82 mg/dL). Liver Function Tests: No results for input(s): AST, ALT, ALKPHOS, BILITOT, PROT, ALBUMIN in the last 168 hours. No results for input(s): LIPASE, AMYLASE in the last 168 hours. No results for input(s): AMMONIA in the last 168 hours. Coagulation Profile: No results for input(s): INR, PROTIME in the last 168 hours. Cardiac Enzymes: No results for input(s): CKTOTAL, CKMB, CKMBINDEX, TROPONINI in the last 168 hours. BNP (last 3 results) No results for input(s): PROBNP in the last 8760 hours. HbA1C: No results for input(s): HGBA1C in the last 72 hours. CBG: Recent Labs  Lab 12/28/17 0953 12/28/17 1152 12/28/17 2215 12/29/17 0522 12/29/17 1113  GLUCAP 241* 170* 177* 163* 193*  Lipid Profile: No results for input(s): CHOL, HDL, LDLCALC, TRIG, CHOLHDL, LDLDIRECT in the last 72 hours. Thyroid Function Tests: No results for input(s): TSH, T4TOTAL, FREET4, T3FREE, THYROIDAB in the last 72 hours. Anemia Panel: No results for input(s): VITAMINB12, FOLATE, FERRITIN, TIBC, IRON, RETICCTPCT in the last 72 hours. Urine analysis:    Component Value Date/Time   COLORURINE RED (A) 10/31/2016 2130   APPEARANCEUR CLOUDY (A) 10/31/2016 2130   LABSPEC 1.027  10/31/2016 2130   PHURINE 6.0 10/31/2016 2130   GLUCOSEU >=500 (A) 10/31/2016 2130   HGBUR LARGE (A) 10/31/2016 2130   BILIRUBINUR NEGATIVE 10/31/2016 2130   [place] NEGATIVE 10/31/2016 2130   PROTEINUR 100 (A) 10/31/2016 2130   UROBILINOGEN 0.2 04/09/2014 1157   NITRITE NEGATIVE 10/31/2016 2130   LEUKOCYTESUR NEGATIVE 10/31/2016 2130   Sepsis Labs: @LABRCNTIP (procalcitonin:4,lacticidven:4)  )No results found for this or any previous visit (from the past 240 hour(s)).    Radiology Studies: No results found.   Scheduled Meds: . buPROPion  100 mg Oral BID  . chlorhexidine  15 mL Mouth Rinse BID  . cholestyramine  4 g Oral QODAY  . enoxaparin (LOVENOX) injection  70 mg Subcutaneous Q24H  . feeding supplement (PRO-STAT SUGAR FREE 64)  30 mL Oral BID  . gabapentin  600 mg Oral BID  . Gerhardt's butt cream   Topical Daily  . insulin aspart  0-20 Units Subcutaneous TID WC  . insulin aspart  0-5 Units Subcutaneous QHS  . insulin aspart  8 Units Subcutaneous TID WC  . insulin glargine  42 Units Subcutaneous QHS  . levothyroxine  75 mcg Oral QAC breakfast  . mouth rinse  15 mL Mouth Rinse q12n4p  . metoprolol tartrate  12.5 mg Oral BID  . pantoprazole  40 mg Oral BID AC   Continuous Infusions:   LOS: 39 days   Time Spent in minutes   30 minutes  Antonieta Slaven D.O. on 12/29/2017 at 12:13 PM  Between 7am to 7pm - Pager - [phone]  After 7pm go to [email or web address] - password [email or web address]  And look for the night coverage person covering for me after hours  [facility] Office  [phone]

## 2017-12-30 LAB — GLUCOSE, CAPILLARY
Glucose-Capillary: 178 mg/dL — ABNORMAL HIGH (ref 65–99)
Glucose-Capillary: 193 mg/dL — ABNORMAL HIGH (ref 65–99)
Glucose-Capillary: 207 mg/dL — ABNORMAL HIGH (ref 65–99)
Glucose-Capillary: 164 mg/dL — ABNORMAL HIGH (ref 65–99)

## 2017-12-30 NOTE — Progress Notes (Signed)
PROGRESS NOTE    [name]  OVZ:[id number] DOB: [birth year] DOA: 11/20/2017 PCP: Sandi Mariscal, MD   Brief Narrative:  [name] Humphreyis a 42 y.o.femalewith a history of morbid obesity (BMI 56), IDDM, OSA, HTN, Cushing's disease and depression who presented 3/13 after suicide attempt by intentional overdose of baclofen, trazodone, and tramadol. She reportedly was encephalopathic and experienced a seizure, intubated for airway protection and admitted to the ICU. After extubation she developed stridor initially responsive to racemic epinephrine with laryngoscopy revealing fixed vocal cords, though when this recurred 3/21 it was unresponsive necessitating tracheostomy with laryngoscopy revealing circumferential subglottic swelling with necrotic debris and fibrinous exudate. Respiratory status has stabilized, though she continued to have significant secretions with odor. Clindamycin was started 3/27 and ENT reevaluated the patient 4/2 due to CT showing left paralaryngeal abscess. Flexible laryngoscopy was repeated showing pooled secretions in pharynx, and inability to assess vocal cord mobility or subglottic patency due to poor laryngeal airway. Psychiatry has continued to recommend transfer to inpatient psychiatry for suicidal ideation and attempt when medically stable.  Laryngoscopy repeated again 4/10, this time showed resolution of infection, stable subglottic granulation tissue.  Plan for longterm trach, likely repeat surgery in >4-6 months.  Trach team has been consulted. Pending inpatient psych placement.  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 -see discussion above -PCCM consulted and appreciated -ENT was consulted and appreciated, laryngoscopy on 12/18/2017 showed an infection had resolved -Patient was treated with 2 weeks of antibiotics including 4 days of clindamycin followed by 7 days of Zosyn and 3 days of Augmentin -ENT would  like to follow-up with patient as an outpatient in the next 3-6 months for possible repeat surgery of subglottic stenosis -Trach team continues to follow patient and teach patient -Wound care consulted for skin irritation around trach site; discussed wound care- recommended Mepitel silicone contact layer around trach and cover with drain sponge daily -patient has been able to care for trach/inner canula removal and cleaning -Currently on room air during the day and oxygen at night -Continue tessalon PRN for cough   Dyspnea with exertion  -likely due to airway plugging -resolved -Echocardiogram showed EF 55-60%  Intentional overdose/Suicidal ideation/Depression -continue Wellbutrin, trazodone as needed for insomnia -Psychiatry consulted and appreciated, recommended inpatient psychiatry admission -psychiatry recommended Atarax 25-50mg  TID PRN for anxiety -have started Atarax 25mg  TID PRN  Acute kidney injury -Resolved  Diabetes mellitus, type II -Continue Lantus 42u, 8u novolog TID, insulin sliding scale, CBG monitoring -CBG better controlled  Seizure disorder -Continue gabapentin  Hypothyroidism -Continue Synthroid  Morbid obesity/Cushing's disease -Continue pro-stat, will need outpatient follow-up and monitoring  Essential hypertension -Continue Lasix, metoprolol  Obstructive sleep apnea -Continue supplemental oxygen at night, patient was previously on CPAP  DVT Prophylaxis  lovenox  Code Status: Full  Family Communication: None at bedside; safety sitter at bedside  Disposition Plan: Admitted. Pending inpatient psych admission- patient medically stable for discharge  Consultants ENT PCCM Psychiatry  Procedures  Intubation/extubation  Renal US LE Ultrasound Laryngoscopy  Trace placement  Antibiotics   Anti-infectives (From admission, onward)   Start     Dose/Rate Route Frequency Ordered Stop   12/19/17 1000  amoxicillin-clavulanate (AUGMENTIN) 875-125 MG  per tablet 1 tablet     1 tablet Oral Every 12 hours 12/18/17 2117 12/22/17 2253   12/12/17 2200  piperacillin-tazobactam (ZOSYN) IVPB 3.375 g  Status:  Discontinued     3.375 g 12.5 mL/hr over 240 Minutes Intravenous Every 8  hours 12/12/17 1720 12/18/17 2117   12/12/17 1800  piperacillin-tazobactam (ZOSYN) IVPB 3.375 g     3.375 g 100 mL/hr over 30 Minutes Intravenous  Once 12/12/17 1720 12/12/17 2059   12/09/17 1400  clindamycin (CLEOCIN) capsule 300 mg  Status:  Discontinued     300 mg Oral Every 8 hours 12/09/17 1015 12/09/17 1016   12/09/17 1400  clindamycin (CLEOCIN) capsule 450 mg  Status:  Discontinued     450 mg Oral Every 8 hours 12/09/17 1016 12/12/17 1647   12/03/17 1800  clindamycin (CLEOCIN) IVPB 300 mg  Status:  Discontinued     300 mg 100 mL/hr over 30 Minutes Intravenous Every 6 hours 12/03/17 1458 12/09/17 1015      Subjective:   [name] seen and examined today. Patient would like to spend her daughter's birthday with her on Thursday. Denies current chest pain, shortness of breath, abdominal pain, N/V/D/C, dizziness, headache. Upset that she is in the hospital.   Objective:   Vitals:   12/30/17 0323 12/30/17 0403 12/30/17 0500 12/30/17 0837  BP: (!) 134/58   124/63  Pulse: 98   98  Resp: 20   20  Temp: 98.6 F (37 C)   98.9 F (37.2 C)  TempSrc: Oral   Oral  SpO2: 97% 97%  100%  Weight:   (!) 141.7 kg (312 lb 6.3 oz)   Height:        Intake/Output Summary (Last 24 hours) at 12/30/2017 1025 Last data filed at 12/29/2017 2259 Gross per 24 hour  Intake 480 ml  Output -  Net 480 ml   Filed Weights   12/26/17 0241 12/29/17 0100 12/30/17 0500  Weight: (!) 143.2 kg (315 lb 11.2 oz) (!) 141.7 kg (312 lb 6.3 oz) (!) 141.7 kg (312 lb 6.3 oz)   Exam  General: Well developed, well nourished, NAD, appears stated age  HEENT: NCAT, mucous membranes moist.   Neck: Trach  Extremities: warm dry without cyanosis clubbing or edema  Neuro: AAOx3,  nonfocal  Skin: Without rashes exudates or nodules  Psych: Pleasant, appropriate mood and affect  Data Reviewed: I have personally reviewed following labs and imaging studies  CBC: No results for input(s): WBC, NEUTROABS, HGB, HCT, MCV, PLT in the last 168 hours. Basic Metabolic Panel: Recent Labs  Lab 12/24/17 0657 12/25/17 0512 12/26/17 0502 12/27/17 0727  NA 137 138 139 140  K 4.5 4.5 4.3 4.5  CL 102 101 105 105  CO2 26 27 27 25   GLUCOSE 205* 172* 163* 193*  BUN 18 20 20 19   CREATININE 0.83 0.86 0.83 0.82  CALCIUM 9.4 9.2 9.1 9.5   GFR: Estimated Creatinine Clearance: 121.1 mL/min (by C-G formula based on SCr of 0.82 mg/dL). Liver Function Tests: No results for input(s): AST, ALT, ALKPHOS, BILITOT, PROT, ALBUMIN in the last 168 hours. No results for input(s): LIPASE, AMYLASE in the last 168 hours. No results for input(s): AMMONIA in the last 168 hours. Coagulation Profile: No results for input(s): INR, PROTIME in the last 168 hours. Cardiac Enzymes: No results for input(s): CKTOTAL, CKMB, CKMBINDEX, TROPONINI in the last 168 hours. BNP (last 3 results) No results for input(s): PROBNP in the last 8760 hours. HbA1C: No results for input(s): HGBA1C in the last 72 hours. CBG: Recent Labs  Lab 12/29/17 0522 12/29/17 1113 12/29/17 1605 12/29/17 2108 12/30/17 0608  GLUCAP 163* 193* 152* 156* 178*   Lipid Profile: No results for input(s): CHOL, HDL, LDLCALC, TRIG, CHOLHDL, LDLDIRECT in  the last 72 hours. Thyroid Function Tests: No results for input(s): TSH, T4TOTAL, FREET4, T3FREE, THYROIDAB in the last 72 hours. Anemia Panel: No results for input(s): VITAMINB12, FOLATE, FERRITIN, TIBC, IRON, RETICCTPCT in the last 72 hours. Urine analysis:    Component Value Date/Time   COLORURINE RED (A) 10/31/2016 2130   APPEARANCEUR CLOUDY (A) 10/31/2016 2130   LABSPEC 1.027 10/31/2016 2130   PHURINE 6.0 10/31/2016 2130   GLUCOSEU >=500 (A) 10/31/2016 2130   HGBUR LARGE  (A) 10/31/2016 2130   BILIRUBINUR NEGATIVE 10/31/2016 2130   [place] NEGATIVE 10/31/2016 2130   PROTEINUR 100 (A) 10/31/2016 2130   UROBILINOGEN 0.2 04/09/2014 1157   NITRITE NEGATIVE 10/31/2016 2130   LEUKOCYTESUR NEGATIVE 10/31/2016 2130   Sepsis Labs: @LABRCNTIP (procalcitonin:4,lacticidven:4)  )No results found for this or any previous visit (from the past 240 hour(s)).    Radiology Studies: No results found.   Scheduled Meds: . buPROPion  100 mg Oral BID  . chlorhexidine  15 mL Mouth Rinse BID  . cholestyramine  4 g Oral QODAY  . enoxaparin (LOVENOX) injection  70 mg Subcutaneous Q24H  . feeding supplement (PRO-STAT SUGAR FREE 64)  30 mL Oral BID  . gabapentin  600 mg Oral BID  . Gerhardt's butt cream   Topical Daily  . insulin aspart  0-20 Units Subcutaneous TID WC  . insulin aspart  0-5 Units Subcutaneous QHS  . insulin aspart  8 Units Subcutaneous TID WC  . insulin glargine  42 Units Subcutaneous QHS  . levothyroxine  75 mcg Oral QAC breakfast  . mouth rinse  15 mL Mouth Rinse q12n4p  . metoprolol tartrate  12.5 mg Oral BID  . pantoprazole  40 mg Oral BID AC   Continuous Infusions:   LOS: 40 days   Time Spent in minutes   30 minutes  Fount Bahe D.O. on 12/30/2017 at 10:25 AM  Between 7am to 7pm - Pager - [phone]  After 7pm go to [email or web address] - password [email or web address]  And look for the night coverage person covering for me after hours  [facility] Office  [phone]

## 2017-12-30 NOTE — Plan of Care (Signed)
  Problem: Clinical Measurements: Goal: Will remain free from infection Outcome: Progressing   Problem: Health Behavior/Discharge Planning: Goal: Ability to manage health-related needs will improve Outcome: Progressing   

## 2017-12-30 NOTE — Consult Note (Signed)
[facility] Nurse wound follow up Wound type: full thickness ulcerations related to infectious process.  Measurement: wounds healed Wound bed: wounds healed Drainage (amount, consistency, odor) none Periwound:intact  Dressing procedure/placement/frequency: No topical care needed. Continue dry split gauze.   Discussed POC with patient and bedside nurse.  Re consult if needed, will not follow at this time. Thanks  Yaron Grasse [employer], RN,CWOCN, CNS, [place] [phone])

## 2017-12-31 LAB — GLUCOSE, CAPILLARY
GLUCOSE-CAPILLARY: 132 mg/dL — AB (ref 65–99)
Glucose-Capillary: 161 mg/dL — ABNORMAL HIGH (ref 65–99)
Glucose-Capillary: 188 mg/dL — ABNORMAL HIGH (ref 65–99)
Glucose-Capillary: 178 mg/dL — ABNORMAL HIGH (ref 65–99)

## 2017-12-31 MED ORDER — BENZONATATE 100 MG PO CAPS
200.0000 mg | ORAL_CAPSULE | Freq: Three times a day (TID) | ORAL | Status: DC | PRN
  Administered 2017-12-31 – 2018-01-05 (×6): 200 mg via ORAL
  Filled 2017-12-31 (×6): qty 2

## 2017-12-31 NOTE — Progress Notes (Signed)
CSW confirmed patient is on [employer] waitlist.   Cedric Fishman LCSW [phone]

## 2017-12-31 NOTE — Progress Notes (Signed)
PROGRESS NOTE    [name]  ZOX:[id number] DOB: [birth year] DOA: 11/20/2017 PCP: Sandi Mariscal, MD   Brief Narrative:  [name] Humphreyis a 42 y.o.femalewith a history of morbid obesity (BMI 56), IDDM, OSA, HTN, Cushing's disease and depression who presented 3/13 after suicide attempt by intentional overdose of baclofen, trazodone, and tramadol. She reportedly was encephalopathic and experienced a seizure, intubated for airway protection and admitted to the ICU. After extubation she developed stridor initially responsive to racemic epinephrine with laryngoscopy revealing fixed vocal cords, though when this recurred 3/21 it was unresponsive necessitating tracheostomy with laryngoscopy revealing circumferential subglottic swelling with necrotic debris and fibrinous exudate. Respiratory status has stabilized, though she continued to have significant secretions with odor. Clindamycin was started 3/27 and ENT reevaluated the patient 4/2 due to CT showing left paralaryngeal abscess. Flexible laryngoscopy was repeated showing pooled secretions in pharynx, and inability to assess vocal cord mobility or subglottic patency due to poor laryngeal airway. Psychiatry has continued to recommend transfer to inpatient psychiatry for suicidal ideation and attempt when medically stable.  Laryngoscopy repeated again 4/10, this time showed resolution of infection, stable subglottic granulation tissue.  Plan for longterm trach, likely repeat surgery in >4-6 months.  Trach team has been consulted. Pending inpatient psych placement.  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 -see discussion above -PCCM consulted and appreciated -ENT was consulted and appreciated, laryngoscopy on 12/18/2017 showed an infection had resolved -Patient was treated with 2 weeks of antibiotics including 4 days of clindamycin followed by 7 days of Zosyn and 3 days of Augmentin -ENT would  like to follow-up with patient as an outpatient in the next 3-6 months for possible repeat surgery of subglottic stenosis -Trach team continues to follow patient and teach patient -Wound care consulted for skin irritation around trach site; discussed wound care- recommended Mepitel silicone contact layer around trach and cover with drain sponge daily -patient has been able to care for trach/inner canula removal and cleaning -Currently on room air during the day and oxygen at night -Continue tessalon PRN for cough- will increase dose to 200mg     Dyspnea with exertion  -likely due to airway plugging -resolved -Echocardiogram showed EF 55-60%  Intentional overdose/Suicidal ideation/Depression -continue Wellbutrin, trazodone as needed for insomnia -Psychiatry consulted and appreciated, recommended inpatient psychiatry admission -psychiatry recommended Atarax 25-50mg  TID PRN for anxiety -have started Atarax 25mg  TID PRN  Acute kidney injury -Resolved  Diabetes mellitus, type II -Continue Lantus 42u, 8u novolog TID, insulin sliding scale, CBG monitoring -CBG better controlled  Seizure disorder -Continue gabapentin  Hypothyroidism -Continue Synthroid  Morbid obesity/Cushing's disease -Continue pro-stat, will need outpatient follow-up and monitoring  Essential hypertension -Continue Lasix, metoprolol  Obstructive sleep apnea -Continue supplemental oxygen at night, patient was previously on CPAP  DVT Prophylaxis  lovenox  Code Status: Full  Family Communication: None at bedside; safety sitter at bedside  Disposition Plan: Admitted. Pending inpatient psych admission- patient medically stable for discharge  Consultants ENT PCCM Psychiatry  Procedures  Intubation/extubation  Renal US LE Ultrasound Laryngoscopy  Trace placement  Antibiotics   Anti-infectives (From admission, onward)   Start     Dose/Rate Route Frequency Ordered Stop   12/19/17 1000   amoxicillin-clavulanate (AUGMENTIN) 875-125 MG per tablet 1 tablet     1 tablet Oral Every 12 hours 12/18/17 2117 12/22/17 2253   12/12/17 2200  piperacillin-tazobactam (ZOSYN) IVPB 3.375 g  Status:  Discontinued     3.375 g 12.5 mL/hr  over 240 Minutes Intravenous Every 8 hours 12/12/17 1720 12/18/17 2117   12/12/17 1800  piperacillin-tazobactam (ZOSYN) IVPB 3.375 g     3.375 g 100 mL/hr over 30 Minutes Intravenous  Once 12/12/17 1720 12/12/17 2059   12/09/17 1400  clindamycin (CLEOCIN) capsule 300 mg  Status:  Discontinued     300 mg Oral Every 8 hours 12/09/17 1015 12/09/17 1016   12/09/17 1400  clindamycin (CLEOCIN) capsule 450 mg  Status:  Discontinued     450 mg Oral Every 8 hours 12/09/17 1016 12/12/17 1647   12/03/17 1800  clindamycin (CLEOCIN) IVPB 300 mg  Status:  Discontinued     300 mg 100 mL/hr over 30 Minutes Intravenous Every 6 hours 12/03/17 1458 12/09/17 1015      Subjective:   [name] seen and examined today.  Patient still would like to go home.  Feels that she has improved.  Denies current chest pain, shortness breath, abdominal pain, nausea or vomiting, diarrhea constipation.  Objective:   Vitals:   12/31/17 0606 12/31/17 0853 12/31/17 1215 12/31/17 1245  BP: 117/64 (!) 123/57  118/66  Pulse: 91 91 93 94  Resp: 18 18 18 18   Temp: 98.3 F (36.8 C) 98.2 F (36.8 C)  98.5 F (36.9 C)  TempSrc: Oral Oral  Oral  SpO2: 96% 99% 93% 97%  Weight:      Height:        Intake/Output Summary (Last 24 hours) at 12/31/2017 1257 Last data filed at 12/31/2017 8182 Gross per 24 hour  Intake 150 ml  Output -  Net 150 ml   Filed Weights   12/29/17 0100 12/30/17 0500 12/31/17 0433  Weight: (!) 141.7 kg (312 lb 6.3 oz) (!) 141.7 kg (312 lb 6.3 oz) (!) 148.7 kg (327 lb 13.2 oz)   Exam  General: Well developed, well nourished, NAD, appears stated age  61: NCAT, mucous membranes moist.   Neck: Trach, well healing  Cardiovascular: S1 S2 auscultated, RRR, no  murmur  Respiratory: Clear to auscultation  Abdomen: Soft, obese, nontender, nondistended, + bowel sounds  Extremities: warm dry without cyanosis clubbing  Neuro: AAOx3, nonfocal  Psych: Normal affect and demeanor with intact judgement and insight  Data Reviewed: I have personally reviewed following labs and imaging studies  CBC: No results for input(s): WBC, NEUTROABS, HGB, HCT, MCV, PLT in the last 168 hours. Basic Metabolic Panel: Recent Labs  Lab 12/25/17 0512 12/26/17 0502 12/27/17 0727  NA 138 139 140  K 4.5 4.3 4.5  CL 101 105 105  CO2 27 27 25   GLUCOSE 172* 163* 193*  BUN 20 20 19   CREATININE 0.86 0.83 0.82  CALCIUM 9.2 9.1 9.5   GFR: Estimated Creatinine Clearance: 125 mL/min (by C-G formula based on SCr of 0.82 mg/dL). Liver Function Tests: No results for input(s): AST, ALT, ALKPHOS, BILITOT, PROT, ALBUMIN in the last 168 hours. No results for input(s): LIPASE, AMYLASE in the last 168 hours. No results for input(s): AMMONIA in the last 168 hours. Coagulation Profile: No results for input(s): INR, PROTIME in the last 168 hours. Cardiac Enzymes: No results for input(s): CKTOTAL, CKMB, CKMBINDEX, TROPONINI in the last 168 hours. BNP (last 3 results) No results for input(s): PROBNP in the last 8760 hours. HbA1C: No results for input(s): HGBA1C in the last 72 hours. CBG: Recent Labs  Lab 12/30/17 1206 12/30/17 1813 12/30/17 2101 12/31/17 0635 12/31/17 1135  GLUCAP 193* 207* 164* 161* 188*   Lipid Profile: No results for  input(s): CHOL, HDL, LDLCALC, TRIG, CHOLHDL, LDLDIRECT in the last 72 hours. Thyroid Function Tests: No results for input(s): TSH, T4TOTAL, FREET4, T3FREE, THYROIDAB in the last 72 hours. Anemia Panel: No results for input(s): VITAMINB12, FOLATE, FERRITIN, TIBC, IRON, RETICCTPCT in the last 72 hours. Urine analysis:    Component Value Date/Time   COLORURINE RED (A) 10/31/2016 2130   APPEARANCEUR CLOUDY (A) 10/31/2016 2130    LABSPEC 1.027 10/31/2016 2130   PHURINE 6.0 10/31/2016 2130   GLUCOSEU >=500 (A) 10/31/2016 2130   HGBUR LARGE (A) 10/31/2016 2130   BILIRUBINUR NEGATIVE 10/31/2016 2130   [place] NEGATIVE 10/31/2016 2130   PROTEINUR 100 (A) 10/31/2016 2130   UROBILINOGEN 0.2 04/09/2014 1157   NITRITE NEGATIVE 10/31/2016 2130   LEUKOCYTESUR NEGATIVE 10/31/2016 2130   Sepsis Labs: @LABRCNTIP (procalcitonin:4,lacticidven:4)  )No results found for this or any previous visit (from the past 240 hour(s)).    Radiology Studies: No results found.   Scheduled Meds: . buPROPion  100 mg Oral BID  . chlorhexidine  15 mL Mouth Rinse BID  . cholestyramine  4 g Oral QODAY  . enoxaparin (LOVENOX) injection  70 mg Subcutaneous Q24H  . feeding supplement (PRO-STAT SUGAR FREE 64)  30 mL Oral BID  . gabapentin  600 mg Oral BID  . Gerhardt's butt cream   Topical Daily  . insulin aspart  0-20 Units Subcutaneous TID WC  . insulin aspart  0-5 Units Subcutaneous QHS  . insulin aspart  8 Units Subcutaneous TID WC  . insulin glargine  42 Units Subcutaneous QHS  . levothyroxine  75 mcg Oral QAC breakfast  . mouth rinse  15 mL Mouth Rinse q12n4p  . metoprolol tartrate  12.5 mg Oral BID  . pantoprazole  40 mg Oral BID AC   Continuous Infusions:   LOS: 41 days   Time Spent in minutes   30 minutes  Grayden Burley D.O. on 12/31/2017 at 12:57 PM  Between 7am to 7pm - Pager - [phone]  After 7pm go to [email or web address] - password [email or web address]  And look for the night coverage person covering for me after hours  [facility] Office  [phone]

## 2018-01-01 DIAGNOSIS — E119 Type 2 diabetes mellitus without complications: Secondary | ICD-10-CM

## 2018-01-01 DIAGNOSIS — Z794 Long term (current) use of insulin: Secondary | ICD-10-CM

## 2018-01-01 LAB — GLUCOSE, CAPILLARY
GLUCOSE-CAPILLARY: 159 mg/dL — AB (ref 65–99)
GLUCOSE-CAPILLARY: 167 mg/dL — AB (ref 65–99)
Glucose-Capillary: 139 mg/dL — ABNORMAL HIGH (ref 65–99)
Glucose-Capillary: 154 mg/dL — ABNORMAL HIGH (ref 65–99)

## 2018-01-01 MED ORDER — METFORMIN HCL 500 MG PO TABS
1000.0000 mg | ORAL_TABLET | Freq: Two times a day (BID) | ORAL | Status: DC
  Administered 2018-01-01 – 2018-01-07 (×12): 1000 mg via ORAL
  Filled 2018-01-01 (×11): qty 2

## 2018-01-01 MED ORDER — PIOGLITAZONE HCL 15 MG PO TABS
15.0000 mg | ORAL_TABLET | Freq: Every day | ORAL | Status: DC
  Administered 2018-01-02 – 2018-01-07 (×6): 15 mg via ORAL
  Filled 2018-01-01 (×6): qty 1

## 2018-01-01 NOTE — Progress Notes (Signed)
Physical Therapy Discharge Patient Details Name: [name] MRN: [id number] DOB: 1975-09-21 Today's Date: 01/01/2018 Time:  -     Patient discharged from PT services secondary to goals met and no further PT needs identified.  Please see latest therapy progress note for current level of functioning and progress toward goals.    Progress and discharge plan discussed with patient and/or caregiver: Patient/Caregiver agrees with plan  GP     Salina April, PTA Pager: [phone]   01/01/2018, 10:04 AM

## 2018-01-01 NOTE — Progress Notes (Signed)
PROGRESS NOTE  [name]  AUQ:[id number] DOB: 08-Feb-1976 DOA: 11/20/2017 PCP: Sandi Mariscal, MD   Brief Narrative: [name] is a 42 y.o. female with a history of morbid obesity (BMI 12), IDDM, OSA, HTN, Cushing's disease and depression who presented 3/13 after suicide attempt by intentional overdose of baclofen, trazodone, and tramadol. She reportedly was encephalopathic and experienced a seizure, intubated for airway protection and admitted to the ICU. After extubation she developed stridor initially responsive to racemic epinephrine with laryngoscopy revealing fixed vocal cords, though when this recurred 3/21 it was unresponsive necessitating tracheostomy with laryngoscopy revealing circumferential subglottic swelling with necrotic debris and fibrinous exudate. Respiratory status has stabilized, though she continued to have significant secretions with odor. Clindamycin was started 3/27 and ENT reevaluated the patient 4/2 due to CT showing left paralaryngeal abscess. Flexible laryngoscopy was repeated showing pooled secretions in pharynx, and inability to assess vocal cord mobility or subglottic patency due to poor laryngeal airway. Psychiatry has continued to recommend transfer to inpatient psychiatry for suicidal ideation and attempt when medically stable.  Laryngoscopy was repeated again on 4/10, showing interval resolution of infection and stable subglottic granulation tissue. Antibiotics were stopped. Plan is for longterm trach, likely repeat surgery after ENT follow up in 4-6 months.   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  - Follow up with ENT in 4-6 months for consideration of repeat surgery.  [facility] team following, providing patient education - [place] consulted, recommended Mepitel silicone contact layer around trach and cover with drain sponge daily  OSA: - Requiring nocturnal O2  Intentional overdose with suicidal ideation, depression:  - Dispo to inpatient psychiatry hospital when bed available per psychiatry recommendations. - Resumed wellbutrin, gabapentin (titrated back to home dose), trazodone. Added hydroxyzine 25mg  TID prn anxiety.  - Landscape architect, suicide precautions. May ambulate.   Diabetes mellitus, type II with hyperglycemia: Last HbA1c 7.2%, possibly some hyperglycemia from steroids. Near inpatient goal.  - Continue lantus 42u daily, novolog 8u TIDWC + resistant SSI.  - Reorder home metformin, pioglitazone.   Morbid obesity:  - Weight control counseling has been provided. Nutrition provided diet education.  - Continue OOB  HTN: Controlled. - Continue metoprolol  Acute kidney injury: Resolved. Monitor intermittently.   Pulmonary edema: Has been intermittent issue throughout hospitalization. Resolved.   Venous insufficiency: Suspected cause of mild LE swelling with is intermittent and chronic. No evidence of DVT on U/S 4/5 somewhat limited by habitus, but no definite clinical evidence of DVT either. - TED hose, elevate extremities, ambulate as able.    Seizure disorder: Stable, no activity.  - Continue gabapentin, and, since no seizures and has been on wellbutrin will continue this. 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   Hypothyroidism: TSH 3.587 - Continue synthroid     DVT prophylaxis: Lovenox Code Status: Full Family Communication: None at bedside Disposition Plan: Medically stable  for disposition to inpatient psychiatric hospitalization. On wait list at [facility].  Consultants:   CCM  Psychiatry  ENT  Procedures:  Intubation/extubation Renal US  Bilateral LE duplex venous U/S Transnasal fiberoptic laryngoscopy x 2 Tracheostomy and direct laryngoscopy   Antimicrobials:  Clindamycin 3/26 - 4/4  Zosyn 4/4 -4/10   Subjective: Delving into the underlying stressors that have led her to this point including putting her first daughter up for adoption, missing her other daughter's recent 13th birthday. No dyspnea or other complaints.   Objective: Vitals:   01/01/18 0556 01/01/18 0836 01/01/18 1150 01/01/18 1411  BP: (!) 102/53 (!) 105/91  111/66  Pulse: 90 90 78 89  Resp: 20 20 18 18   Temp: 98.2 F (36.8 C) 98.6 F (37 C)    TempSrc: Oral Oral  Oral  SpO2: 98% 98% 98% 98%  Weight:      Height:       No intake or output data in the 24 hours ending 01/01/18 1453 Filed Weights   12/29/17 0100 12/30/17 0500 12/31/17 0433  Weight: (!) 141.7 kg (312 lb 6.3 oz) (!) 141.7 kg (312 lb 6.3 oz) (!) 148.7 kg (327 lb 13.2 oz)    Gen: Morbidly obese, pleasant 42 y.o. female in no distress, walking in unit, interviewed in room. Neck: Large. Tracheostomy site healing well, no secretions. PMV in place, voice appears normal. Pulm: Non-labored, normal rate, through trach with supplemental O2. Clear with good air movement. No stridor. Phonates with finger occlusion of trach. CV: Regular rate and rhythm. No murmur, rub, or gallop.  GI: Abdomen soft, non-tender, non-distended, with normoactive bowel sounds. No organomegaly or masses felt. Ext: Warm, no deformities. Very minimal pitting LE pedal edema L > R Skin: As above, otherwise, no wounds.  Neuro: Alert and oriented. No deficits Psych: Depressed mood with broad affect. Judgment  fair, insight intact. Not responding to internal stimuli  CBC: No results for input(s): WBC, NEUTROABS, HGB, HCT, MCV, PLT in the last 168 hours. Basic Metabolic Panel: Recent Labs  Lab 12/26/17 0502 12/27/17 0727  NA 139 140  K 4.3 4.5  CL 105 105  CO2 27 25  GLUCOSE 163* 193*  BUN 20 19  CREATININE 0.83 0.82  CALCIUM 9.1 9.5   Liver Function Tests: No results for input(s): AST, ALT, ALKPHOS, BILITOT, PROT, ALBUMIN in the last 168 hours. CBG: Recent Labs  Lab 12/31/17 1135 12/31/17 1603 12/31/17 2128 01/01/18 0622 01/01/18 1202  GLUCAP 188* 132* 178* 159* 167*   Urine analysis:    Component Value Date/Time   COLORURINE RED (A) 10/31/2016 2130   APPEARANCEUR CLOUDY (A) 10/31/2016 2130   LABSPEC 1.027 10/31/2016 2130   PHURINE 6.0 10/31/2016 2130   GLUCOSEU >=500 (A) 10/31/2016 2130   HGBUR LARGE (A) 10/31/2016 2130   BILIRUBINUR NEGATIVE 10/31/2016 2130   KETONESUR NEGATIVE 10/31/2016 2130   PROTEINUR 100 (A) 10/31/2016 2130   UROBILINOGEN 0.2 04/09/2014 1157   NITRITE NEGATIVE 10/31/2016 2130   LEUKOCYTESUR NEGATIVE 10/31/2016 2130   No results found for this or any previous visit (from the past 240 hour(s)).    Radiology Studies: No results found.  Scheduled Meds: . buPROPion  100 mg Oral BID  . chlorhexidine  15 mL Mouth Rinse BID  . cholestyramine  4 g Oral QODAY  . enoxaparin (LOVENOX) injection  70 mg Subcutaneous Q24H  . feeding supplement (PRO-STAT SUGAR FREE 64)  30 mL Oral BID  . gabapentin  600 mg Oral BID  .  Gerhardt's butt cream   Topical Daily  . insulin aspart  0-20 Units Subcutaneous TID WC  . insulin aspart  0-5 Units Subcutaneous QHS  . insulin aspart  8 Units Subcutaneous TID WC  . insulin glargine  42 Units Subcutaneous QHS  . levothyroxine  75 mcg Oral QAC breakfast  . mouth rinse  15 mL Mouth Rinse q12n4p  . metoprolol tartrate  12.5 mg Oral BID  . pantoprazole  40 mg Oral BID AC   Continuous Infusions:    LOS: 42 days    Time spent: 25 minutes.  Vance Gather, MD [facility] Pager [phone]  If 7PM-7AM, please contact night-coverage [email or web address] Password [facility] 01/01/2018, 2:53 PM

## 2018-01-01 NOTE — Progress Notes (Signed)
CSW confirmed patient is still on [employer] waitlist.   Cedric Fishman LCSW [phone]

## 2018-01-01 NOTE — Progress Notes (Signed)
Nutrition Follow-up  DOCUMENTATION CODES:   Morbid obesity  INTERVENTION:  Pro-stat 59m BID each supplement provides 100 calories and 15 grams of protein Continue Milk on all trays  NUTRITION DIAGNOSIS:   Increased nutrient needs related to acute illness as evidenced by estimated needs. -ongoing  GOAL:   Patient will meet greater than or equal to 90% of their needs -met  MONITOR:   PO intake, I & O's, Weight trends, Labs, Supplement acceptance  ASSESSMENT:   42yo female admitted with acute toxic encephalopathy secondary to intentional OD on baclofan, trazadone and  tramadol, seizures. Pt intubate for airway protection. Pt with hx of Cushing's, DM, OSA  3/21 - Underwent laryngoscopy and trached 3/22 - Still suffering from pharyngeal dysphagia due to edmea, unable to use PMSV. 3/23 - Cortrak unable to be placed due to nare sensitivity, tube going into airway 3/25 - MBSS completed - patient recommended to continue to be NPO 3/27 - SLP evaluated again, recommended alternative means of nutrition, RD paged MD about cortrak placement and tubefeeding x2, he is deferring to speech pathology and ENT. 3/28 - No tube placed 3/29 - MBSS done, Advanced to NDD3/thin liquids 4/10 - Repeat laryngoscopy, trach downsized 4/15 - [place] to [place] - SLP signed off  [name] is doing well today, PO 100%; taking pro-stat No complaints Weight fluctuating, up to 327 today, but stable since admit. [facility]  Labs reviewed Medications reviewed and include:  Insulin, Questran, Metformin  Diet Order:  Diet Carb Modified Fluid consistency: Thin; Room service appropriate? Yes  EDUCATION NEEDS:   Not appropriate for education at this time  Skin:  Skin Assessment: Skin Integrity Issues: Skin Integrity Issues:: Incisions Incisions: Closed incision to neck Other: MASD: abdomen, bilateral groin, under breasts  Last BM:  12/30/2017  Height:    Ht Readings from Last 1 Encounters:  12/08/17 5' 2"  (1.575 m)    Weight:   Wt Readings from Last 1 Encounters:  12/31/17 (!) 327 lb 13.2 oz (148.7 kg)    Ideal Body Weight:  52.3 kg  BMI:  Body mass index is 59.96 kg/m.  Estimated Nutritional Needs:   Kcal:  1832-2200 calories  Protein:  115-131 grams  Fluid:  1.8-2.2L  WSatira [name] [name] Mcnew, MS, RD LDN Inpatient Clinical Dietitian Pager [phone]

## 2018-01-02 LAB — GLUCOSE, CAPILLARY
GLUCOSE-CAPILLARY: 171 mg/dL — AB (ref 65–99)
GLUCOSE-CAPILLARY: 172 mg/dL — AB (ref 65–99)
Glucose-Capillary: 133 mg/dL — ABNORMAL HIGH (ref 65–99)
Glucose-Capillary: 123 mg/dL — ABNORMAL HIGH (ref 65–99)

## 2018-01-02 NOTE — Progress Notes (Signed)
PROGRESS NOTE  [name]  VZD:[id number] DOB: Jan 10, 1976 DOA: 11/20/2017 PCP: Sandi Mariscal, MD   Brief Narrative: [name] is a 42 y.o. female with a history of morbid obesity (BMI 50), IDDM, OSA, HTN, Cushing's disease and depression who presented 3/13 after suicide attempt by intentional overdose of baclofen, trazodone, and tramadol. She reportedly was encephalopathic and experienced a seizure, intubated for airway protection and admitted to the ICU. After extubation she developed stridor initially responsive to racemic epinephrine with laryngoscopy revealing fixed vocal cords, though when this recurred 3/21 it was unresponsive necessitating tracheostomy with laryngoscopy revealing circumferential subglottic swelling with necrotic debris and fibrinous exudate. Respiratory status has stabilized, though she continued to have significant secretions with odor. Clindamycin was started 3/27 and ENT reevaluated the patient 4/2 due to CT showing left paralaryngeal abscess. Flexible laryngoscopy was repeated showing pooled secretions in pharynx, and inability to assess vocal cord mobility or subglottic patency due to poor laryngeal airway. Psychiatry has continued to recommend transfer to inpatient psychiatry for suicidal ideation and attempt when medically stable.  Laryngoscopy was repeated again on 4/10, showing interval resolution of infection and stable subglottic granulation tissue. Antibiotics were stopped. Plan is for longterm trach, likely repeat surgery after ENT follow up in 4-6 months.   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  - Follow up with ENT in 4-6 months for consideration of repeat surgery.  Lurline Idol team following, providing patient education - [place] consulted, recommended Mepitel silicone contact layer around trach and cover with drain sponge daily  OSA: - Requiring nocturnal O2  Intentional overdose with suicidal ideation, depression:  - Dispo to inpatient psychiatry hospital when bed available per psychiatry recommendations. - Resumed wellbutrin, gabapentin (titrated back to home dose), trazodone. Added hydroxyzine 25mg  TID prn anxiety.  - Landscape architect, suicide precautions. May ambulate.   Diabetes mellitus, type II with hyperglycemia: Last HbA1c 7.2%, possibly some hyperglycemia from steroids. Near inpatient goal.  - Continue lantus 42u daily, novolog 8u TIDWC + resistant SSI.  - Reordered home metformin, pioglitazone.   Morbid obesity:  - Weight control counseling has been provided. Nutrition provided diet education.  - Continue OOB  HTN: Controlled. - Continue metoprolol  Acute kidney injury: Resolved. Monitor intermittently.   Pulmonary edema: Has been intermittent issue throughout hospitalization. Resolved.   Venous insufficiency: Suspected cause of mild LE swelling with is intermittent and chronic. No evidence of DVT on U/S 4/5 somewhat limited by habitus, but no definite clinical evidence of DVT either. - TED hose, elevate extremities, ambulate as able.    Seizure disorder: Stable, no activity.  - Continue gabapentin, and, since no seizures and has been on wellbutrin will continue this. 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   Hypothyroidism: TSH 3.587 - Continue synthroid    DVT prophylaxis: Lovenox Code Status: Full Family Communication: None at bedside Disposition Plan: Medically stable for  disposition to inpatient psychiatric hospitalization. On wait list at [facility]  Consultants:   CCM  Psychiatry  ENT  Procedures:  Intubation/extubation Renal US  Bilateral LE duplex venous U/S Transnasal fiberoptic laryngoscopy x 2 Tracheostomy and direct laryngoscopy   Antimicrobials:  Clindamycin 3/26 - 4/4  Zosyn 4/4 -4/10   Subjective: No changes. Reports frustration at being in the hospital. Wants to know when she'll be transferred. No dyspnea or other complaints.   Objective: Vitals:   01/02/18 0802 01/02/18 0832 01/02/18 1114 01/02/18 1123  BP: 113/86  131/60   Pulse: (!) 102 98 96 96  Resp: 20 20 18 18   Temp: 98.1 F (36.7 C)  98.3 F (36.8 C)   TempSrc: Oral  Oral   SpO2: 95% 96% 97% 97%  Weight:      Height:       No intake or output data in the 24 hours ending 01/02/18 1147 Filed Weights   12/29/17 0100 12/30/17 0500 12/31/17 0433  Weight: (!) 141.7 kg (312 lb 6.3 oz) (!) 141.7 kg (312 lb 6.3 oz) (!) 148.7 kg (327 lb 13.2 oz)    Gen: Morbidly obese, pleasant 42 y.o. female in no distress, walking in unit, interviewed in room. Neck: Trach in place, no secretions. PMV in place, voice appears normal. Pulm: Non-labored, normal rate, on room air. CV: Regular rate and rhythm. No murmur, rub, or gallop. Very minimal pitting LE pedal edema L > R Psych: Depressed mood with broad affect. Judgment fair, insight intact. Not responding to internal stimuli  CBC: No results for input(s): WBC, NEUTROABS, HGB, HCT, MCV, PLT in the last 168 hours. Basic Metabolic Panel: Recent Labs  Lab 12/27/17 0727  NA 140  K 4.5  CL 105  CO2 25  GLUCOSE 193*  BUN 19  CREATININE 0.82  CALCIUM 9.5   Liver Function Tests: No results for input(s): AST, ALT, ALKPHOS, BILITOT, PROT, ALBUMIN in the last 168 hours. CBG: Recent Labs    Lab 01/01/18 0622 01/01/18 1202 01/01/18 1636 01/01/18 2138 01/02/18 0633  GLUCAP 159* 167* 139* 154* 171*   Urine analysis:    Component Value Date/Time   COLORURINE RED (A) 10/31/2016 2130   APPEARANCEUR CLOUDY (A) 10/31/2016 2130   LABSPEC 1.027 10/31/2016 2130   PHURINE 6.0 10/31/2016 2130   GLUCOSEU >=500 (A) 10/31/2016 2130   HGBUR LARGE (A) 10/31/2016 2130   BILIRUBINUR NEGATIVE 10/31/2016 2130   KETONESUR NEGATIVE 10/31/2016 2130   PROTEINUR 100 (A) 10/31/2016 2130   UROBILINOGEN 0.2 04/09/2014 1157   NITRITE NEGATIVE 10/31/2016 2130   LEUKOCYTESUR NEGATIVE 10/31/2016 2130   No results found for this or any previous visit (from the past 240 hour(s)).    Radiology Studies: No results found.  Scheduled Meds: . buPROPion  100 mg Oral BID  . chlorhexidine  15 mL Mouth Rinse BID  . cholestyramine  4 g Oral QODAY  . enoxaparin (LOVENOX) injection  70 mg Subcutaneous Q24H  . feeding supplement (PRO-STAT SUGAR FREE 64)  30 mL Oral BID  . gabapentin  600 mg Oral BID  . Gerhardt's butt cream   Topical Daily  . insulin aspart  0-20 Units Subcutaneous TID WC  . insulin aspart  0-5 Units Subcutaneous QHS  . insulin aspart  8 Units Subcutaneous TID WC  . insulin glargine  42 Units Subcutaneous QHS  . levothyroxine  75 mcg Oral QAC breakfast  . mouth rinse  15 mL Mouth Rinse q12n4p  . metFORMIN  1,000  mg Oral BID WC  . metoprolol tartrate  12.5 mg Oral BID  . pantoprazole  40 mg Oral BID AC  . pioglitazone  15 mg Oral Daily   Continuous Infusions:    LOS: 43 days   Time spent: 15 minutes.  Vance Gather, MD [facility] Pager [phone]  If 7PM-7AM, please contact night-coverage [email or web address] Password [facility] 01/02/2018, 11:47 AM

## 2018-01-02 NOTE — Progress Notes (Signed)
Patient seen for trach team follow up.  All needed equipment at the bedside.  No education needed at this time, education has been ongoing.  Will continue to follow for progression.

## 2018-01-03 LAB — CBC WITH DIFFERENTIAL/PLATELET
Basophils Absolute: 0.1 10*3/uL (ref 0.0–0.1)
Basophils Relative: 1 %
Eosinophils Absolute: 0.2 10*3/uL (ref 0.0–0.7)
Eosinophils Relative: 2 %
HEMATOCRIT: 37.6 % (ref 36.0–46.0)
HEMOGLOBIN: 11.8 g/dL — AB (ref 12.0–15.0)
LYMPHS ABS: 1.8 10*3/uL (ref 0.7–4.0)
Lymphocytes Relative: 25 %
MCH: 28.7 pg (ref 26.0–34.0)
MCHC: 31.4 g/dL (ref 30.0–36.0)
MCV: 91.5 fL (ref 78.0–100.0)
MONOS PCT: 8 %
Monocytes Absolute: 0.6 10*3/uL (ref 0.1–1.0)
NEUTROS ABS: 4.6 10*3/uL (ref 1.7–7.7)
NEUTROS PCT: 64 %
Platelets: 383 10*3/uL (ref 150–400)
RBC: 4.11 MIL/uL (ref 3.87–5.11)
RDW: 14.3 % (ref 11.5–15.5)
WBC: 7.1 10*3/uL (ref 4.0–10.5)

## 2018-01-03 LAB — GLUCOSE, CAPILLARY
Glucose-Capillary: 150 mg/dL — ABNORMAL HIGH (ref 65–99)
Glucose-Capillary: 164 mg/dL — ABNORMAL HIGH (ref 65–99)
Glucose-Capillary: 130 mg/dL — ABNORMAL HIGH (ref 65–99)
Glucose-Capillary: 110 mg/dL — ABNORMAL HIGH (ref 65–99)

## 2018-01-03 LAB — COMPREHENSIVE METABOLIC PANEL
ALBUMIN: 3.1 g/dL — AB (ref 3.5–5.0)
ALT: 24 U/L (ref 14–54)
ANION GAP: 12 (ref 5–15)
AST: 20 U/L (ref 15–41)
Alkaline Phosphatase: 75 U/L (ref 38–126)
BILIRUBIN TOTAL: 0.6 mg/dL (ref 0.3–1.2)
BUN: 15 mg/dL (ref 6–20)
CO2: 21 mmol/L — AB (ref 22–32)
CREATININE: 0.78 mg/dL (ref 0.44–1.00)
Calcium: 9.3 mg/dL (ref 8.9–10.3)
Chloride: 104 mmol/L (ref 101–111)
GFR calc non Af Amer: >60 mL/min (ref >60)
GLUCOSE: 160 mg/dL — AB (ref 65–99)
Potassium: 4.2 mmol/L (ref 3.5–5.1)
Sodium: 137 mmol/L (ref 135–145)
TOTAL PROTEIN: 6.7 g/dL (ref 6.5–8.1)

## 2018-01-03 NOTE — Progress Notes (Signed)
PROGRESS NOTE  [name]  BJY:[id number] DOB: 1976/03/08 DOA: 11/20/2017 PCP: Sandi Mariscal, MD   Brief Narrative:  42 y.o. female with a history of morbid obesity (BMI 49), IDDM, OSA, HTN, Cushing's disease and depression who presented 3/13 after suicide attempt by intentional overdose of baclofen, trazodone, and tramadol. She reportedly was encephalopathic and experienced a seizure, intubated for airway protection and admitted to the ICU.  After extubation she developed stridor initially responsive to racemic epinephrine with laryngoscopy revealing fixed vocal cords, though when this recurred 3/21 it was unresponsive necessitating tracheostomy with laryngoscopy revealing circumferential subglottic swelling with necrotic debris and fibrinous exudate. Respiratory status has stabilized, though she continued to have significant secretions with odor. Clindamycin was started 3/27 and ENT reevaluated the patient 4/2 due to CT showing left paralaryngeal abscess.  Flexible laryngoscopy was repeated showing pooled secretions in pharynx, and inability to assess vocal cord mobility or subglottic patency due to poor laryngeal airway.  Psychiatry has continued to recommend transfer to inpatient psychiatry for suicidal ideation and attempt when medically stable.  Laryngoscopy was repeated again on 4/10, showing interval resolution of infection and stable subglottic granulation tissue. Antibiotics were stopped. Plan is for longterm trach, likely repeat surgery after ENT follow up in 4-6 months.   The patient is medically stable and is awaiting inpatient psychiatry placement at [facility]: Principal Problem:   MDD (major depressive disorder), recurrent episode, moderate (HCC) Active Problems:   Intentional overdose of drug in tablet form ([place])   Status epilepticus ([place])   Major depressive disorder, recurrent episode, severe (HCC)   Shortness of breath   Hoarse voice  quality   Hypomagnesemia   Acute respiratory failure with hypoxemia (HCC)   SOB (shortness of breath)   Tracheostomy status ([place])  Acute hypoxic respiratory failure due to overdose now s/p tracheostomy secondary to subglottic stenosis and wound infection: Resolved.  - Follow up with ENT in 4-6 months for consideration of repeat surgery.  [facility] Id[facility] team following, providing patient education - [place] consulted, recommended Mepitel silicone contact layer around trach and cover with drain sponge daily  OSA: - Requiring nocturnal O2  Intentional overdose with suicidal ideation, depression:  - Dispo to inpatient psychiatry hospital when bed available per psychiatry recommendations. - Resumed wellbutrin, gabapentin (titrated back to home dose), trazodone. Added hydroxyzine 25mg  TID prn anxiety.  - Landscape architect, suicide precautions. Encouraging ambulation  Diabetes mellitus, type II with hyperglycemia: Last HbA1c 7.2%, possibly some hyperglycemia from steroids. Near inpatient goal.  - Continue lantus 42u daily, novolog 8u TIDWC + resistant SSI. Sugars  130-164 - Reordered home metformin, pioglitazone.   Morbid obesity:  - Weight control counseling has been provided. Nutrition provided diet education.  - Continue OOB Might benefit from- .jprog  HTN: Controlled. - Continue metoprolol  Acute kidney injury: Resolved. Monitor intermittently.   Pulmonary edema: Has been intermittent issue throughout hospitalization. Resolved.   Venous insufficiency: Suspected cause of mild LE swelling with is intermittent and chronic. No evidence of DVT on U/S 4/5 somewhat limited by habitus, but no definite clinical evidence of DVT either. - TED hose, elevate extremities, ambulate as able.    Seizure disorder: Stable, no activity.  - Continue gabapentin, and, since no seizures and has been on wellbutrin will continue this. 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    Hypothyroidism: TSH 3.587 - Continue synthroid   DVT prophylaxis: Lovenox Code Status: Full Family Communication: None at bedside Disposition  Plan: Medically stable for disposition to inpatient psychiatric hospitalization. On wait list at [facility].  Consultants:   CCM  Psychiatry  ENT  Procedures:  Intubation/extubation Renal US  Bilateral LE duplex venous U/S Transnasal fiberoptic laryngoscopy x 2 Tracheostomy and direct laryngoscopy   Antimicrobials:  Clindamycin 3/26 - 4/4  Zosyn 4/4 -4/10   Subjective: No changes. Doing crossword puzzles at the bedside, sitter is present, no other issue  Objective: Vitals:   01/03/18 1122 01/03/18 1206 01/03/18 1520 01/03/18 1645  BP: 127/66   106/70  Pulse: 88 (!) 103 99 (!) 104  Resp: 20 20 20 18   Temp: 98.5 F (36.9 C)   99.1 F (37.3 C)  TempSrc: Oral   Oral  SpO2: 100% 96% 96% 98%  Weight:      Height:        Intake/Output Summary (Last 24 hours) at 01/03/2018 1713 Last data filed at 01/03/2018 0700 Gross per 24 hour  Intake 480 ml  Output -  Net 480 ml   Filed Weights   12/29/17 0100 12/30/17 0500 12/31/17 0433  Weight: (!) 141.7 kg (312 lb 6.3 oz) (!) 141.7 kg (312 lb 6.3 oz) (!) 148.7 kg (327 lb 13.2 oz)    Gen: Morbidly obese, pleasant 42 y.o. female in no distress, walking in unit, interviewed in room. Neck: Trach in place, no secretions. PMV in place, voice appears normal and phonating well Pulm: Non-labored, normal rate, on room air. CV: Regular rate and rhythm. No murmur, rub, or gallop.  No lower extremity edema Psych: Depressed mood with flat affect. Judgment fair, insight intact. Not responding to internal stimuli   CBC: Recent Labs  Lab 01/03/18 0848  WBC 7.1  NEUTROABS 4.6  HGB 11.8*  HCT 37.6  MCV 91.5  PLT 423   Basic Metabolic Panel: Recent Labs  Lab 01/03/18 0848  NA 137  K 4.2  CL 104  CO2 21*  GLUCOSE 160*  BUN 15  CREATININE 0.78  CALCIUM 9.3    Liver Function Tests: Recent Labs  Lab 01/03/18 0848  AST 20  ALT 24  ALKPHOS 75  BILITOT 0.6  PROT 6.7  ALBUMIN 3.1*   CBG: Recent Labs  Lab 01/02/18 1615 01/02/18 2147 01/03/18 0706 01/03/18 1120 01/03/18 1642  GLUCAP 133* 123* 150* 164* 130*   Urine analysis:    Component Value Date/Time   COLORURINE RED (A) 10/31/2016 2130   APPEARANCEUR CLOUDY (A) 10/31/2016 2130   LABSPEC 1.027 10/31/2016 2130   PHURINE 6.0 10/31/2016 2130   GLUCOSEU >=500 (A) 10/31/2016 2130   HGBUR LARGE (A) 10/31/2016 2130   BILIRUBINUR NEGATIVE 10/31/2016 2130   [place] NEGATIVE 10/31/2016 2130   PROTEINUR 100 (A) 10/31/2016 2130   UROBILINOGEN 0.2 04/09/2014 1157   NITRITE NEGATIVE 10/31/2016 2130   LEUKOCYTESUR NEGATIVE 10/31/2016 2130   No results found for this or any previous visit (from the past 240 hour(s)).    Radiology Studies: No results found.  Scheduled Meds: . buPROPion  100 mg Oral BID  . chlorhexidine  15 mL Mouth Rinse BID  . cholestyramine  4 g Oral QODAY  . enoxaparin (LOVENOX) injection  70 mg Subcutaneous Q24H  . feeding supplement (PRO-STAT SUGAR FREE 64)  30 mL Oral BID  . gabapentin  600 mg Oral BID  . Gerhardt's butt cream   Topical Daily  . insulin aspart  0-20 Units Subcutaneous TID WC  . insulin aspart  0-5 Units Subcutaneous QHS  . insulin aspart  8 Units Subcutaneous TID WC  . insulin glargine  42 Units Subcutaneous QHS  . levothyroxine  75 mcg Oral QAC breakfast  . mouth rinse  15 mL Mouth Rinse q12n4p  . metFORMIN  1,000 mg Oral BID WC  . metoprolol tartrate  12.5 mg Oral BID  . pantoprazole  40 mg Oral BID AC  . pioglitazone  15 mg Oral Daily   Continuous Infusions:    LOS: 44 days   Time spent: 15 minutes.  Verneita Griffes, MD [phone]   If 7PM-7AM, please contact night-coverage [email or web address] Password [facility] 01/03/2018, 5:13 PM

## 2018-01-03 NOTE — Care Management Note (Signed)
Case Management Note  Patient Details  Name: MODESTY RUDY MRN: [id number] Date of Birth: 1976-02-05  Subjective/Objective:                    Action/Plan: Awaiting [facility] bed. CM following.  Expected Discharge Date:                  Expected Discharge Plan:  Psychiatric Hospital  In-House Referral:     Discharge planning Services  CM Consult  Post Acute Care Choice:    Choice offered to:     DME Arranged:    DME Agency:     HH Arranged:    [place] Agency:     Status of Service:  In process, will continue to follow  If discussed at Long Length of Stay Meetings, dates discussed:    Additional Comments:  Pollie Friar, RN 01/03/2018, 2:56 PM

## 2018-01-03 NOTE — Clinical Social Work Note (Signed)
CSW confirmed with [name] pt is still on list.   [name], [place]

## 2018-01-04 LAB — GLUCOSE, CAPILLARY
GLUCOSE-CAPILLARY: 130 mg/dL — AB (ref 65–99)
Glucose-Capillary: 107 mg/dL — ABNORMAL HIGH (ref 65–99)
Glucose-Capillary: 152 mg/dL — ABNORMAL HIGH (ref 65–99)
Glucose-Capillary: 154 mg/dL — ABNORMAL HIGH (ref 65–99)

## 2018-01-04 NOTE — Progress Notes (Signed)
Seen this am and then met with her and family in [place] having dinner Will reeval in am  Verneita Griffes, MD [facility] [phone]

## 2018-01-04 NOTE — Progress Notes (Signed)
RT came to see patient for 0800 rounds and patient is in the shower. Per nurse tech patient's trach is intact and patient is doing well this morning. RT will check back at 1200 rounds.

## 2018-01-05 LAB — GLUCOSE, CAPILLARY
GLUCOSE-CAPILLARY: 124 mg/dL — AB (ref 65–99)
Glucose-Capillary: 135 mg/dL — ABNORMAL HIGH (ref 65–99)
Glucose-Capillary: 151 mg/dL — ABNORMAL HIGH (ref 65–99)
Glucose-Capillary: 128 mg/dL — ABNORMAL HIGH (ref 65–99)
Glucose-Capillary: 97 mg/dL (ref 65–99)

## 2018-01-05 NOTE — Progress Notes (Signed)
Pt was out of room

## 2018-01-05 NOTE — Evaluation (Signed)
Clinical/Bedside Swallow Evaluation Patient Details  Name: [name] MRN: [id number] Date of Birth: Feb 14, 1976  Today's Date: 01/05/2018 Time: SLP Start Time (ACUTE ONLY): 75 SLP Stop Time (ACUTE ONLY): 1055 SLP Time Calculation (min) (ACUTE ONLY): 15 min  Past Medical History:  Past Medical History:  Diagnosis Date  . Anxiety   . Complication of anesthesia    Pt. states takes long time to wake up from it.   . Cushing's disease ([place])   . Depression   . Diabetes ([place])   . Hyperlipidemia   . Hyperlipidemia   . Hypertension   . Morbid obesity ([place])   . Osteoporosis 07/19/2015  . 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 Sleep apnea   . Uncontrolled diabetes mellitus with diabetic neuropathy, with long-term current use of insulin ([place]) 07/16/2015  . Vitamin D deficiency 07/19/2015   Past Surgical History:  Past Surgical History:  Procedure Laterality Date  . ANTERIOR TALOFIBULAR LIGAMENT REPAIR Left 11/15/2014   Procedure: ANTERIOR TALOFIBULAR LIGAMENT REPAIR;  Surgeon: Jana Half, DPM;  Location: [facility];  Service: Podiatry;  Laterality: Left;  . CESAREAN SECTION  dec 1997/  06-03-2001/   01-01-2005   BILATERAL TUBAL LIGATION WITH LAST ONE  . DILATION AND CURETTAGE OF UTERUS  1995   WITH SUCTION  . DIRECT LARYNGOSCOPY N/A 12/18/2017   Procedure: DIRECT LARYNGOSCOPY;  Surgeon: Izora Gala, MD;  Location: [facility];  Service: ENT;  Laterality: N/A;  . ESOPHAGOGASTRODUODENOSCOPY (EGD) WITH PROPOFOL N/A 10/04/2016   Procedure: ESOPHAGOGASTRODUODENOSCOPY (EGD) WITH PROPOFOL;  Surgeon: [name], MD;  Location: [facility];  Service: Endoscopy;  Laterality: N/A;  . LAPAROSCOPIC CHOLECYSTECTOMY  09-25-2005  . ORIF TIBIA PLATEAU Right 07/19/2015   Procedure: OPEN REDUCTION INTERNAL FIXATION (ORIF) RIGHT TIBIAL PLATEAU;  Surgeon: Altamese Darrouzett, MD;  Location: [facility];  Service: Orthopedics;  Laterality: Right;   . PARTIAL KNEE ARTHROPLASTY Right 04/19/2014   Procedure: RIGHT UNI KNEE ARTHROPLASTY MEDIALLY ;  Surgeon: Mauri Pole, MD;  Location: [facility];  Service: Orthopedics;  Laterality: Right;  . TRACHEOSTOMY TUBE PLACEMENT N/A 11/28/2017   Procedure: TRACHEOSTOMY;  Surgeon: Izora Gala, MD;  Location: [facility];  Service: ENT;  Laterality: N/A;  . TUBAL LIGATION     HPI:  Patient is a 42 y/o female who presented with drug OD and Sz. Intubated 3/13-3/15. resp failure with emergent trach 3/21.  ENT flexible laryngoscopy revealed posterior supraglottic swelling; swollen vocal folds with no abduction; edematous arytenoids; entire subglottis circumferentially swollen with necrotic debris and fibrinous exudate. Pt seen by SLP for PMSV and swallowing; was tolerating dys 3 diet, thin liquids and wearing PMSV all waking hours; independent for donning/doffing valve and SLP s/o 12/24/17. Evaluated at request of Dr. Verlon Au, who reports pt with increased coughing since last night.   Assessment / Plan / Recommendation Clinical Impression   Pt upright in chair with [place] #6 cuffless trach, independently places PMSV to communicate with SLP. She reports increased coughing since last night; not associated with eating or drinking per her report. She has strong, dry coughing at baseline, voice is strong and clear. SLP reassessed swallowing, administering bedside swallow assessment. Pt passes 3 oz swallow challenge without difficulties, tolerates thin liquids, pureed and regular solids with appearance of adequate airway protection. Pt has been afebrile and lung sounds clear, though diminished. No coughing associated with PO intake. Pt tolerated PMSV without evidence of air trapping, decreased airway patency, or respiratory  distress throughout assessment; pt removed valve at end of assessment without back pressure. Recommend she continue regular diet, thin liquids, meds whole with liquid. She remains at mild risk for aspiration due  to tracheostomy. No further skilled ST needs identified; SLP will s/o.   SLP Visit Diagnosis: Dysphagia, oropharyngeal phase (R13.12)    Aspiration Risk  Mild aspiration risk    Diet Recommendation Regular;Thin liquid   Liquid Administration via: Cup;Straw Medication Administration: Whole meds with liquid Supervision: Patient able to self feed    Other  Recommendations Oral Care Recommendations: Oral care BID   Follow up Recommendations Skilled Nursing facility      Frequency and Duration            Prognosis Prognosis for Safe Diet Advancement: Good      Swallow Study   General Date of Onset: 11/20/17 HPI: Patient is a 42 y/o female who presented with drug OD and Sz. Intubated 3/13-3/15. resp failure with emergent trach 3/21.  ENT flexible laryngoscopy revealed posterior supraglottic swelling; swollen vocal folds with no abduction; edematous arytenoids; entire subglottis circumferentially swollen with necrotic debris and fibrinous exudate. Pt seen by SLP for PMSV and swallowing; was tolerating dys 3 diet, thin liquids and wearing PMSV all waking hours; independent for donning/doffing valve and SLP s/o 12/24/17. Evaluated at request of Dr. Verlon Au, who reports pt with increased coughing since last night. Type of Study: Bedside Swallow Evaluation Previous Swallow Assessment: see HPI Diet Prior to this Study: Regular;Thin liquids Temperature Spikes Noted: No Respiratory Status: Trach;Trach Collar Trach Size and Type: Uncuffed;#6 History of Recent Intubation: Yes Length of Intubations (days): 2 days Date extubated: (3/15, trached 3/21) Behavior/Cognition: Alert;Cooperative Oral Cavity Assessment: Within Functional Limits Oral Care Completed by SLP: No Oral Cavity - Dentition: Adequate natural dentition Vision: Functional for self-feeding Self-Feeding Abilities: Able to feed self Patient Positioning: Upright in chair Baseline Vocal Quality: Normal Volitional Cough:  Strong Volitional Swallow: Able to elicit    Oral/Motor/Sensory Function Overall Oral Motor/Sensory Function: Within functional limits   Ice Chips Ice chips: Within functional limits Presentation: Spoon;Self Fed   Thin Liquid Thin Liquid: Within functional limits Presentation: Cup;Straw    Nectar Thick Nectar Thick Liquid: Not tested   Honey Thick Honey Thick Liquid: Not tested   Puree Puree: Within functional limits Presentation: Self Fed;Spoon   Solid   GO   Solid: Within functional limits Presentation: [place], [place], [place] Speech-Language Pathologist [phone]  Aliene Altes 01/05/2018,11:07 AM

## 2018-01-05 NOTE — Progress Notes (Signed)
PROGRESS NOTE  [name]  TDH:[id number] DOB: [birth year] DOA: 11/20/2017 PCP: Sandi Mariscal, MD   Brief Narrative:  42 y.o. female  (BMI 2), IDDM, OSA, HTN, Cushing's disease and depression who presented 3/13 after suicide attempt by intentional overdose of baclofen, trazodone, and tramadol.  ncephalopathic and experienced a seizure, intubated for airway protection and admitted to the ICU.  After extubation she developed stridor initially responsive to racemic epinephrine with laryngoscopy revealing fixed vocal cords, though when this recurred 3/21 it was unresponsive necessitating tracheostomy with laryngoscopy revealing circumferential subglottic swelling with necrotic debris and fibrinous exudate.  Respiratory status has stabilized, though she continued to have significant secretions with odor. Clindamycin was started 3/27 and ENT reevaluated the patient 4/2 due to CT showing left paralaryngeal abscess.  Flexible laryngoscopy was repeated showing pooled secretions in pharynx, and inability to assess vocal cord mobility or subglottic patency due to poor laryngeal airway.  Psychiatry has continued to recommend transfer to inpatient psychiatry for suicidal ideation and attempt when medically stable.  Laryngoscopy was repeated again on 4/10, showing interval resolution of infection and stable subglottic granulation tissue. Antibiotics were stopped. Plan is for longterm trach, likely repeat surgery after ENT follow up in 4-6 months.   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  - Follow up with ENT in 4-6 months for consideration of repeat surgery.  [facility] team to follow the patient peripherally I will asked him to see her tomorrow -Will need x-ray periodically--had some increased secretions cough and wheeze on 4/28 will repeat the same 4/29 - WOC consulted, recommended Mepitel silicone contact layer around trach and cover with drain sponge daily  OSA: - Requiring nocturnal O2  Intentional overdose with suicidal ideation, depression:  - Dispo to inpatient psychiatry hospital when bed available per psychiatry recommendations. - Resumed wellbutrin, gabapentin (titrated back to home dose), trazodone. Added hydroxyzine 7m TID prn anxiety.  - CLandscape architect suicide precautions. Encouraging ambulation  Diabetes mellitus, type II with hyperglycemia: Last HbA1c 7.2%, possibly some hyperglycemia from steroids. Near inpatient goal.  - Continue lantus 42u daily, novolog 8u TIDWC + resistant SSI. Sugars 128-151 - Reordered home metformin, pioglitazone.   Morbid obesity:  - Weight control counseling has been provided. Nutrition provided diet education.  - Continue OOB  HTN: Controlled. - Continue metoprolol  Acute kidney injury: Resolved. Monitor intermittently.   Pulmonary edema: Has been intermittent issue throughout hospitalization. Resolved.   Venous insufficiency: Suspected cause of mild LE swelling with is intermittent and chronic. No evidence of DVT on U/S 4/5 somewhat limited by habitus, but no definite clinical evidence of DVT either. - TED hose, elevate extremities, ambulate as able.    Seizure disorder: Stable, no activity.  - Continue gabapentin, and, since no seizures and has been on wellbutrin will continue this.   Hypothyroidism:  TSH 3.587 - Continue synthroid   DVT prophylaxis: Lovenox Code Status: Full  Family Communication: None at [place] husband 4/27 Disposition Plan:   [facility] to be dispositioned to  Consultants:   CCM  Psychiatry  ENT  Procedures:  Intubation/extubation Renal US  Bilateral LE duplex venous U/S Transnasal fiberoptic laryngoscopy x 2 Tracheostomy and direct laryngoscopy   Antimicrobials:  Clindamycin 3/26 - 4/4  Zosyn 4/4 -4/10   Subjective:  This am some wheeze cough and secretions and PMV needed to be removed I have assessed here through the day and she has been more stable She is phonating well with PMV and SLP saw her and cleared her to continue diet etc  Objective: Vitals:   01/05/18 0949 01/05/18 1212 01/05/18 1630 01/05/18 1651  BP:  122/68  130/70  Pulse: (!) 111 (!) 113 98 86  Resp: (!) 24 (!) 25 20 20   Temp:  98.8 F (37.1 C)  98.6 F (37 C)  TempSrc:  Oral  Oral  SpO2: 100% 100% 100% 100%  Weight:      Height:        Intake/Output Summary (Last 24 hours) at 01/05/2018 1700 Last data filed at 01/05/2018 5374 Gross per 24 hour  Intake 480 ml  Output -  Net 480 ml   Filed Weights   12/31/17 0433 01/04/18 0500 01/05/18 0345  Weight: (!) 148.7 kg (327 lb 13.2 oz) (!) 146.2 kg (322 lb 5 oz) (!) 145.7 kg (321 lb 3.4 oz)    Gen: Morbidly obese, pleasant 42 y.o. female i today in the morning and was placed on trach collar 10 L Neck: Trach in place, no secretions. PMV in place, voice appears normal and phonating well Pulm: Non-labored, normal rate, on room air. CV: Regular rate and rhythm. No murmur, rub, or gallop.  No lower extremity edema Psych: Depressed mood with flat affect. Judgment fair, insight intact. Not responding to internal stimuli   CBC: Recent Labs  Lab 01/03/18 0848  WBC 7.1  NEUTROABS 4.6  HGB 11.8*  HCT 37.6  MCV 91.5  PLT 827   Basic Metabolic Panel: Recent Labs  Lab 01/03/18 0848  NA 137    K 4.2  CL 104  CO2 21*  GLUCOSE 160*  BUN 15  CREATININE 0.78  CALCIUM 9.3   Liver Function Tests: Recent Labs  Lab 01/03/18 0848  AST 20  ALT 24  ALKPHOS 75  BILITOT 0.6  PROT 6.7  ALBUMIN 3.1*   CBG: Recent Labs  Lab 01/04/18 1605 01/04/18 2137 01/05/18 0603 01/05/18 1129 01/05/18 1626  GLUCAP 130* 154* 135* 151* 128*   Urine analysis:    Component Value Date/Time   COLORURINE RED (A) 10/31/2016 2130   APPEARANCEUR CLOUDY (A) 10/31/2016 2130   LABSPEC 1.027 10/31/2016 2130   PHURINE 6.0 10/31/2016 2130   GLUCOSEU >=500 (A) 10/31/2016 2130   HGBUR LARGE (A) 10/31/2016 2130   BILIRUBINUR NEGATIVE 10/31/2016 2130   [place] NEGATIVE 10/31/2016 2130   PROTEINUR 100 (A) 10/31/2016 2130   UROBILINOGEN 0.2 04/09/2014 1157   NITRITE NEGATIVE 10/31/2016 2130   LEUKOCYTESUR NEGATIVE 10/31/2016 2130   No results found for this or any previous visit (from the past 240 hour(s)).    Radiology Studies: No results found.  Scheduled Meds: . buPROPion  100 mg Oral BID  . chlorhexidine  15 mL Mouth Rinse BID  . cholestyramine  4 g Oral QODAY  . enoxaparin (LOVENOX) injection  70 mg Subcutaneous Q24H  . feeding supplement (PRO-STAT SUGAR FREE 64)  30 mL Oral BID  .  gabapentin  600 mg Oral BID  . Gerhardt's butt cream   Topical Daily  . insulin aspart  0-20 Units Subcutaneous TID WC  . insulin aspart  0-5 Units Subcutaneous QHS  . insulin aspart  8 Units Subcutaneous TID WC  . insulin glargine  42 Units Subcutaneous QHS  . levothyroxine  75 mcg Oral QAC breakfast  . mouth rinse  15 mL Mouth Rinse q12n4p  . metFORMIN  1,000 mg Oral BID WC  . metoprolol tartrate  12.5 mg Oral BID  . pantoprazole  40 mg Oral BID AC  . pioglitazone  15 mg Oral Daily   Continuous Infusions:    LOS: 46 days   Time spent: 15 minutes.  Verneita Griffes, MD [phone]   If 7PM-7AM, please contact night-coverage [email or web address] Password [facility] 01/05/2018, 5:00 PM

## 2018-01-06 LAB — GLUCOSE, CAPILLARY
Glucose-Capillary: 116 mg/dL — ABNORMAL HIGH (ref 65–99)
Glucose-Capillary: 138 mg/dL — ABNORMAL HIGH (ref 65–99)
Glucose-Capillary: 118 mg/dL — ABNORMAL HIGH (ref 65–99)
Glucose-Capillary: 75 mg/dL (ref 65–99)

## 2018-01-06 MED ORDER — ACETAMINOPHEN 325 MG PO TABS: 650.0000 mg | ORAL_TABLET | Freq: Four times a day (QID) | ORAL | Status: DC | PRN

## 2018-01-06 MED ORDER — LEVOTHYROXINE SODIUM 75 MCG PO TABS: 75.0000 ug | ORAL_TABLET | Freq: Every day | ORAL | Status: DC

## 2018-01-06 MED ORDER — METOPROLOL TARTRATE 25 MG PO TABS: 12.5000 mg | ORAL_TABLET | Freq: Two times a day (BID) | ORAL | Status: DC

## 2018-01-06 MED ORDER — CHOLESTYRAMINE 4 G PO PACK: 4.0000 g | PACK | ORAL | 12 refills | Status: DC

## 2018-01-06 NOTE — Progress Notes (Addendum)
Patient awake alert and well  Ready medically for d/c tomorrow to [facility] per CSW input  Verneita Griffes, MD [facility] [phone]   NO charge

## 2018-01-06 NOTE — Progress Notes (Signed)
Patient not ready for HS ATC. RT will check back at a later time.

## 2018-01-06 NOTE — Discharge Summary (Signed)
Physician Discharge Summary  [name] DEY:[id number] DOB: 10/17/1975 DOA: 11/20/2017  PCP: Sandi Mariscal, MD  Admit date: 11/20/2017 Discharge date: 01/06/2018  Time spent: 45 minutes  Recommendations for Outpatient Follow-up:  1. Patient will discharge to [facility] for further psychiatric care 2. Needs chem-7 and cbc 1 week 3. Needs cxr in 2 weeks 4. Need TSH 1 mo 5. Needs trach clinic follow-up ---will discuss with ENT and will need follow-up with Dr. Melida Quitter of [facility] ENT on d/c in 2-3 weeks  Discharge Diagnoses:  Principal Problem:   MDD (major depressive disorder), recurrent episode, moderate ([place]) Active Problems:   Intentional overdose of drug in tablet form ([place])   Status epilepticus ([place])   Major depressive disorder, recurrent episode, severe ([facility])   Shortness of breath   Hoarse voice quality   Hypomagnesemia   Acute respiratory failure with hypoxemia ([facility])   SOB (shortness of breath)   Tracheostomy status ([place])   Discharge Condition: improved  Diet recommendation: diabetic  Filed Weights   01/04/18 0500 01/05/18 0345 01/06/18 0350  Weight: (!) 146.2 kg (322 lb 5 oz) (!) 145.7 kg (321 lb 3.4 oz) (!) 144.2 kg (317 lb 14.5 oz)    History of present illness:  42 y.o. female  (BMI 56), IDDM, OSA, HTN, Cushing's disease and depression who presented 3/13 after suicide attempt by intentional overdose of baclofen, trazodone, and tramadol.  ncephalopathic and experienced a seizure, intubated for airway protection and admitted to the ICU.  After extubation she developed stridor initially responsive to racemic epinephrine with laryngoscopy revealing fixed vocal cords, though when this recurred 3/21 it was unresponsive necessitating tracheostomy with laryngoscopy revealing circumferential subglottic swelling with necrotic debris and fibrinous exudate.  Respiratory status has stabilized, though she continued to have significant secretions with odor.  Clindamycin was started 3/27 and ENT reevaluated the patient 4/2 due to CT showing left paralaryngeal abscess.  Flexible laryngoscopy was repeated showing pooled secretions in pharynx, and inability to assess vocal cord mobility or subglottic patency due to poor laryngeal airway.  Psychiatry has continued to recommend transfer to inpatient psychiatry for suicidal ideation and attempt when medically stable.  Laryngoscopy was repeated again on 4/10, showing interval resolution of infection and stable subglottic granulation tissue. Antibiotics were stopped. Plan is for longterm trach, likely repeat surgery after ENT follow up in 4-6 months.   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  - Follow up with ENT in 4-6 months for consideration of repeat surgery. Will schedule and leave on AVS timing of needed follow-up -Will need x-ray periodically--had some increased secretions cough and wheeze on 4/28 will repeat the same 4/29 - WOC consulted, recommended Mepitel silicone contact layer around trach and cover with drain sponge daily  OSA: - Requiring nocturnal O2  Intentional overdose with suicidal ideation, depression:  - Dispo to inpatient psychiatry hospital when bed available per psychiatry recommendations. - Resumed wellbutrin, gabapentin (titrated back to home dose), trazodone. Added hydroxyzine 25mg  TID prn anxiety--changed - Continue sitter, suicide precautions. Encouraging ambulation  Diabetes mellitus, type II with hyperglycemia: Last HbA1c 7.2%, possibly some hyperglycemia from steroids. Near inpatient goal.  - on dc changed to home regimen-OP titration as required-see MAr - Reordered home metformin, pioglitazone.   Morbid obesity:  - Weight control  counseling has  been provided. Nutrition provided diet education.  - Continue OOB  HTN: Controlled. - Continue metoprolol  Acute kidney injury: Resolved. Monitor intermittently.   Pulmonary edema: Has been intermittent issue throughout hospitalization. Resolved.   Venous insufficiency: Suspected cause of mild LE swelling with is intermittent and chronic. No evidence of DVT on U/S 4/5 somewhat limited by habitus, but no definite clinical evidence of DVT either. - TED hose, elevate extremities, ambulate as able.    Seizure disorder: Stable, no activity.  - Continue gabapentin, and, since no seizures and has been on wellbutrin will continue this.   Hypothyroidism: TSH 3.587    Procedures:  multiple   Consultations:  ent  critical care  Discharge Exam: Vitals:   01/06/18 1544 01/06/18 1547  BP: 112/71   Pulse: 91 91  Resp: 20 18  Temp: 99.2 F (37.3 C)   SpO2: 91% 94%    General: awake alert pleasant morbidly obese in no CP distress-with PMV Cardiovascular: s1 s 2no m/r/g Respiratory: clinically clear with no added sound abd morbidly obese with no rebound no gaurding  Discharge Instructions   Discharge Instructions    Diet - low sodium heart healthy   Complete by:  As directed    Increase activity slowly   Complete by:  As directed      Allergies as of 01/06/2018      Reactions   Penicillins Hives, Other (See Comments)   PATIENT HAS HAD A PCN REACTION WITH IMMEDIATE RASH, FACIAL/TONGUE/THROAT SWELLING, SOB, OR LIGHTHEADEDNESS WITH HYPOTENSION:   YES ** see below HAS PT DEVELOPED SEVERE RASH INVOLVING MUCUS MEMBRANES or SKIN NECROSIS: YES ** see below Has patient had a PCN reaction that required hospitalization No Has patient had a PCN reaction occurring within the last 10 years: No **TOLERATED ZOSYN 12/12/17      Medication List    STOP taking these medications   baclofen 10 MG tablet Commonly known as:  LIORESAL   doxycycline 100 MG  capsule Commonly known as:  VIBRAMYCIN   gabapentin 300 MG capsule Commonly known as:  NEURONTIN   hydrOXYzine 50 MG capsule Commonly known as:  VISTARIL   metoCLOPramide 10 MG tablet Commonly known as:  REGLAN   ondansetron 4 MG disintegrating tablet Commonly known as:  ZOFRAN ODT   traMADol 50 MG tablet Commonly known as:  ULTRAM     TAKE these medications   acetaminophen 325 MG tablet Commonly known as:  TYLENOL Take 2 tablets (650 mg total) by mouth every 6 (six) hours as needed for mild pain or headache.   atorvastatin 20 MG tablet Commonly known as:  LIPITOR Take 20 mg by mouth at bedtime.   buPROPion 100 MG 12 hr tablet Commonly known as:  WELLBUTRIN SR Take 100 mg by mouth 2 (two) times daily.   cholestyramine 4 g packet Commonly known as:  QUESTRAN Take 1 packet (4 g total) by mouth every other day. Start taking on:  01/08/2018   insulin NPH-regular Human (70-30) 100 UNIT/ML injection Commonly known as:  NOVOLIN 70/30 Inject 28 Units into the skin 2 (two) times daily with a meal. What changed:    how much to take  when to take this  Another medication with the same name was removed. Continue taking this medication, and follow the directions you see here.   levothyroxine 75 MCG tablet Commonly known as:  SYNTHROID, LEVOTHROID Take 1 tablet (75 mcg total) by mouth daily before breakfast. Start taking on:  01/07/2018 What changed:  when to take this  Another medication with the same name was removed. Continue taking this medication, and follow the directions you see here.   metFORMIN 1000 MG tablet Commonly known as:  GLUCOPHAGE Take 1,000 mg by mouth 2 (two) times daily with a meal.   metoprolol tartrate 25 MG tablet Commonly known as:  LOPRESSOR Take 0.5 tablets (12.5 mg total) by mouth 2 (two) times daily.   naproxen 500 MG tablet Commonly known as:  NAPROSYN Take 1 tablet (500 mg total) by mouth 2 (two) times daily.   norethindrone 0.35  MG tablet Commonly known as:  MICRONOR,CAMILA,ERRIN Take 1 tablet by mouth daily.   pantoprazole 40 MG tablet Commonly known as:  PROTONIX TAKE ONE TABLET BY MOUTH TWICE A DAY BEFORE MEALS   pioglitazone 15 MG tablet Commonly known as:  ACTOS Take 15 mg by mouth daily.   quinapril 5 MG tablet Commonly known as:  ACCUPRIL Take 5 mg by mouth every morning.   sucralfate 1 g tablet Commonly known as:  CARAFATE Take 1 tablet (1 g total) by mouth 4 (four) times daily -  with meals and at bedtime.   traZODone 50 MG tablet Commonly known as:  DESYREL Take 50 mg by mouth at bedtime.      Allergies  Allergen Reactions  . Penicillins Hives and Other (See Comments)    PATIENT HAS HAD A PCN REACTION WITH IMMEDIATE RASH, FACIAL/TONGUE/THROAT SWELLING, SOB, OR LIGHTHEADEDNESS WITH HYPOTENSION:   YES ** see below HAS PT DEVELOPED SEVERE RASH INVOLVING MUCUS MEMBRANES or SKIN NECROSIS: YES ** see below Has patient had a PCN reaction that required hospitalization No Has patient had a PCN reaction occurring within the last 10 years: No  **TOLERATED ZOSYN 12/12/17   Follow-up Information    [facility] RESPIRATORY THERAPY Follow up on 01/22/2018.   Specialty:  Respiratory Therapy Why:  trach clinic w/ pete babcock NP 12 noon Contact information: [address] [id number] [place] [phone]           The results of significant diagnostics from this hospitalization (including imaging, microbiology, ancillary and laboratory) are listed below for reference.    Significant Diagnostic Studies: Dg Chest 2 View  Result Date: 12/22/2017 CLINICAL DATA:  Shortness of breath. EXAM: CHEST - 2 VIEW COMPARISON:  Chest x-ray dated December 09, 2017. FINDINGS: Unchanged tracheostomy tube. Stable cardiomediastinal silhouette. Normal pulmonary vascularity. No focal consolidation, pleural effusion, or pneumothorax. No acute osseous abnormality.  IMPRESSION: No active cardiopulmonary disease. Electronically Signed   By: Titus Dubin M.D.   On: 12/22/2017 17:26   Ct Soft Tissue Neck W Contrast  Addendum Date: 12/10/2017   ADDENDUM REPORT: 12/10/2017 11:17 ADDENDUM: Study discussed by telephone with Dr. Nita Sells on 12/10/2017 at 1102 hours. Electronically Signed   By: Genevie Ann M.D.   On: 12/10/2017 11:17   Result Date: 12/10/2017 CLINICAL DATA:  42 year old female with moderate amounts of foul smelling tan secretions draining from the tracheostomy site. 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 EXAM: CT NECK WITH CONTRAST TECHNIQUE: Multidetector CT imaging of the neck was performed using the standard protocol following the bolus administration of intravenous contrast. CONTRAST:  26mL ISOVUE-300 IOPAMIDOL (ISOVUE-300) INJECTION 61% COMPARISON:  Head CT without contrast 11/20/2017. Cervical spine MRI 07/15/2013. FINDINGS: Large body habitus. Pharynx and larynx: Tracheostomy tube in place. There is a small volume of fluid and gas along  the caudal aspect of the tracheostomy incision best seen on sagittal image 45. This appears to be contained within the incision site, with no associated rim enhancing fluid collection. Above the tracheostomy the glottis is closed. The oropharynx is largely effaced. Negative nasopharynx aside from mild symmetric appearing adenoid hypertrophy. The superior parapharyngeal and retropharyngeal spaces are normal. The lower retropharyngeal space remains normal. However, in the left supraglottic space there are two gas and fluid containing collections which appear to communicate and encompasses an area of 24 x 14 x 28 millimeters. These do not appear to be within the lumen of the left piriform sinus, but rather appear pathologic in the left lateral larynx potential space subjacent to the left thyroid cartilage. See  series 3, image 56 and sagittal image 54. No thyroid cartilage or other laryngeal cartilage destruction is evident. Salivary glands: Negative. Thyroid: Within normal limits. Lymph nodes: Mild asymmetric enlargement of the left level 2 and level 3 lymph nodes, but these maintain a physiologic fatty hilum (series 3, image 42). Other lymph node stations appear normal. No cystic or necrotic nodes. Vascular: Suboptimal intravascular contrast bolus, but the major vascular structures in the neck and at the skull base appear to remain patent. Limited intracranial: Negative. Visualized orbits: Negative. Mastoids and visualized paranasal sinuses: Clear. Skeleton: Occasional carious dentition. Cervical spine degeneration. No acute osseous abnormality identified. Upper chest: Mediastinal lipomatosis. Small right paratracheal lymph nodes. No superior mediastinal inflammation is evident. Negative lung apices. IMPRESSION: 1. A left lateral supraglottic laryngeal Abscess is suspected (sagittal image 54) with pockets of low-density fluid and gas subjacent to the left thyroid cartilage. This measures up to 2.8 cm. No cartilage destruction. I considered but do not suspect that this is simply trapped fluid and gas in the left piriform sinus. 2. Otherwise there is only a small volume of gas and fluid located within the inferior tracheostomy incision site (sagittal image 45). 3. Mild reactive appearing left level II/III and right paratracheal lymph nodes. 4. Otherwise negative neck CT. Electronically Signed: By: Genevie Ann M.D. On: 12/10/2017 10:48   Dg Chest Port 1 View  Result Date: 12/09/2017 CLINICAL DATA:  Pneumonia EXAM: PORTABLE CHEST 1 VIEW COMPARISON:  12/06/2017; 11/25/2017; 11/10/2017 FINDINGS: Grossly unchanged cardiac silhouette and mediastinal contours. Stable positioning of support apparatus. Improved aeration of lung bases without new focal airspace opacity. No pleural effusion or pneumothorax. No evidence of edema. No  acute osseous abnormalities. IMPRESSION: Improved aeration of the lung bases without superimposed acute cardiopulmonary disease on this AP portable examination. Further evaluation with a PA and lateral chest radiograph may be obtained as clinically indicated. Electronically Signed   By: Sandi Mariscal M.D.   On: 12/09/2017 18:28    Microbiology: No results found for this or any previous visit (from the past 240 hour(s)).   Labs: Basic Metabolic Panel: Recent Labs  Lab 01/03/18 0848  NA 137  K 4.2  CL 104  CO2 21*  GLUCOSE 160*  BUN 15  CREATININE 0.78  CALCIUM 9.3   Liver Function Tests: Recent Labs  Lab 01/03/18 0848  AST 20  ALT 24  ALKPHOS 75  BILITOT 0.6  PROT 6.7  ALBUMIN 3.1*   No results for input(s): LIPASE, AMYLASE in the last 168 hours. No results for input(s): AMMONIA in the last 168 hours. CBC: Recent Labs  Lab 01/03/18 0848  WBC 7.1  NEUTROABS 4.6  HGB 11.8*  HCT 37.6  MCV 91.5  PLT 383   Cardiac Enzymes: No results  for input(s): CKTOTAL, CKMB, CKMBINDEX, TROPONINI in the last 168 hours. BNP: BNP (last 3 results) Recent Labs    12/22/17 0259  BNP 33.5    ProBNP (last 3 results) No results for input(s): PROBNP in the last 8760 hours.  CBG: Recent Labs  Lab 01/05/18 1626 01/05/18 2313 01/06/18 0636 01/06/18 1159 01/06/18 1629  GLUCAP 128* 97 116* 138* 118*       Signed:  Nita Sells MD   [facility] 01/06/2018, 4:47 PM

## 2018-01-07 LAB — GLUCOSE, CAPILLARY: GLUCOSE-CAPILLARY: 108 mg/dL — AB (ref 65–99)

## 2018-01-07 MED ORDER — ALBUTEROL SULFATE HFA 108 (90 BASE) MCG/ACT IN AERS: 2.0000 | INHALATION_SPRAY | Freq: Four times a day (QID) | RESPIRATORY_TRACT | 2 refills | Status: DC | PRN

## 2018-01-07 NOTE — Discharge Summary (Addendum)
Physician Discharge Summary  Niko Jakel Beharry CBJ:[id number] DOB: 09-17-1975 DOA: 11/20/2017  PCP: Sandi Mariscal, MD  Admit date: 11/20/2017 Discharge date: 01/07/2018  Time spent: 45 minutes  Recommendations for Outpatient Follow-up:  1. Patient will discharge to [facility] for further psychiatric care 2. Needs chem-7 and cbc 1 week 3. Needs cxr in 2 weeks 4. Need TSH 1 mo 5. Please contact [facility] ENT in 2 months for consideration of Tracheotomy follow up 6. Needs trach clinic follow-up ---01/22/18 at 11:30 am with Mr Marni Griffon  Discharge Diagnoses:  Principal Problem:   MDD (major depressive disorder), recurrent episode, moderate ([place]) Active Problems:   Intentional overdose of drug in tablet form ([place])   Status epilepticus ([place])   Major depressive disorder, recurrent episode, severe (HCC)   Shortness of breath   Hoarse voice quality   Hypomagnesemia   Acute respiratory failure with hypoxemia (HCC)   SOB (shortness of breath)   Tracheostomy status ([place])   Discharge Condition: improved  Diet recommendation: diabetic  Filed Weights   01/05/18 0345 01/06/18 0350 01/07/18 0500  Weight: (!) 145.7 kg (321 lb 3.4 oz) (!) 144.2 kg (317 lb 14.5 oz) (!) 148.2 kg (326 lb 11.6 oz)    History of present illness:  42 y.o. female  (BMI 56), IDDM, OSA, HTN, Cushing's disease and depression who presented 3/13 after suicide attempt by intentional overdose of baclofen, trazodone, and tramadol.  ncephalopathic and experienced a seizure, intubated for airway protection and admitted to the ICU.  After extubation she developed stridor initially responsive to racemic epinephrine with laryngoscopy revealing fixed vocal cords, though when this recurred 3/21 it was unresponsive necessitating tracheostomy with laryngoscopy revealing circumferential subglottic swelling with necrotic debris and fibrinous exudate.  Respiratory status has stabilized, though she continued to have significant  secretions with odor. Clindamycin was started 3/27 and ENT reevaluated the patient 4/2 due to CT showing left paralaryngeal abscess.  Flexible laryngoscopy was repeated showing pooled secretions in pharynx, and inability to assess vocal cord mobility or subglottic patency due to poor laryngeal airway.  Psychiatry has continued to recommend transfer to inpatient psychiatry for suicidal ideation and attempt when medically stable.  Laryngoscopy was repeated again on 4/10, showing interval resolution of infection and stable subglottic granulation tissue. Antibiotics were stopped. Plan is for longterm trach, likely repeat surgery after ENT follow up in 4-6 months.   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  - Follow up with ENT in 4-6 months for consideration of repeat surgery. Will schedule and leave on AVS timing of needed follow-up -Will need x-ray periodically--sayi n 2 weeks - WOC consulted, recommended Mepitel silicone contact layer around trach and cover with drain sponge daily  OSA: - Requiring nocturnal O2  Intentional overdose with suicidal ideation, depression:  - Dispo to inpatient psychiatry hospital when bed available per psychiatry recommendations. - Resumed wellbutrin, gabapentin (titrated back to home dose), trazodone. Added hydroxyzine 25mg  TID prn anxiety--changed on d/c to hs - Landscape architect, suicide precautions. Encouraging ambulation  Diabetes mellitus, type II with hyperglycemia: Last HbA1c 7.2%, possibly some hyperglycemia from steroids. Near inpatient goal.  - on dc changed to home regimen-OP titration as required-see MAr - Reordered home metformin, pioglitazone.   Morbid obesity:  - Weight control counseling has been provided. Nutrition  provided  diet education.  - Continue OOB  HTN: Controlled. - Continue metoprolol  Acute kidney injury: Resolved. Monitor intermittently.   Pulmonary edema: Has been intermittent issue throughout hospitalization. Resolved.   Venous insufficiency: Suspected cause of mild LE swelling with is intermittent and chronic. No evidence of DVT on U/S 4/5 somewhat limited by habitus, but no definite clinical evidence of DVT either. - TED hose, elevate extremities, ambulate as able.    Seizure disorder: Stable, no activity.  - Continue gabapentin, and, since no seizures and has been on wellbutrin will continue this.   Hypothyroidism: TSH 3.587 Needs rpt TSH and t4 1 mo    Procedures:  multiple   Consultations:  ent  critical care  Discharge Exam: Vitals:   01/07/18 0500 01/07/18 0827  BP:    Pulse: 94 (!) 113  Resp: 18 18  Temp:    SpO2: 96% 99%    General: awake alert pleasant morbidly obese in no CP distress-with PMV--no other issues Cardiovascular: s1 s 2no m/r/g Respiratory: clinically clear with no added sound--no rales abd morbidly obese with no rebound no gaurding  Discharge Instructions   Discharge Instructions    Diet - low sodium heart healthy   Complete by:  As directed    Increase activity slowly   Complete by:  As directed      Allergies as of 01/07/2018      Reactions   Penicillins Hives, Other (See Comments)   PATIENT HAS HAD A PCN REACTION WITH IMMEDIATE RASH, FACIAL/TONGUE/THROAT SWELLING, SOB, OR LIGHTHEADEDNESS WITH HYPOTENSION:   YES ** see below HAS PT DEVELOPED SEVERE RASH INVOLVING MUCUS MEMBRANES or SKIN NECROSIS: YES ** see below Has patient had a PCN reaction that required hospitalization No Has patient had a PCN reaction occurring within the last 10 years: No **TOLERATED ZOSYN 12/12/17      Medication List    STOP taking these medications   baclofen 10 MG tablet Commonly known as:  LIORESAL   doxycycline 100 MG capsule Commonly  known as:  VIBRAMYCIN   gabapentin 300 MG capsule Commonly known as:  NEURONTIN   hydrOXYzine 50 MG capsule Commonly known as:  VISTARIL   metoCLOPramide 10 MG tablet Commonly known as:  REGLAN   ondansetron 4 MG disintegrating tablet Commonly known as:  ZOFRAN ODT   traMADol 50 MG tablet Commonly known as:  ULTRAM     TAKE these medications   acetaminophen 325 MG tablet Commonly known as:  TYLENOL Take 2 tablets (650 mg total) by mouth every 6 (six) hours as needed for mild pain or headache.   albuterol 108 (90 Base) MCG/ACT inhaler Commonly known as:  PROVENTIL HFA;VENTOLIN HFA Inhale 2 puffs into the lungs every 6 (six) hours as needed for wheezing or shortness of breath.   atorvastatin 20 MG tablet Commonly known as:  LIPITOR Take 20 mg by mouth at bedtime.   buPROPion 100 MG 12 hr tablet Commonly known as:  WELLBUTRIN SR Take 100 mg by mouth 2 (two) times daily.   cholestyramine 4 g packet Commonly known as:  QUESTRAN Take 1 packet (4 g total) by mouth every other day. Start taking on:  01/08/2018   insulin NPH-regular Human (70-30) 100 UNIT/ML injection Commonly known as:  NOVOLIN 70/30 Inject 28 Units into the skin 2 (two) times daily with a meal. What changed:    how much to take  when to take this  Another medication with the same name was removed. Continue taking this medication,  and follow the directions you see here.   levothyroxine 75 MCG tablet Commonly known as:  SYNTHROID, LEVOTHROID Take 1 tablet (75 mcg total) by mouth daily before breakfast. What changed:    when to take this  Another medication with the same name was removed. Continue taking this medication, and follow the directions you see here.   metFORMIN 1000 MG tablet Commonly known as:  GLUCOPHAGE Take 1,000 mg by mouth 2 (two) times daily with a meal.   metoprolol tartrate 25 MG tablet Commonly known as:  LOPRESSOR Take 0.5 tablets (12.5 mg total) by mouth 2 (two) times  daily.   naproxen 500 MG tablet Commonly known as:  NAPROSYN Take 1 tablet (500 mg total) by mouth 2 (two) times daily.   norethindrone 0.35 MG tablet Commonly known as:  MICRONOR,CAMILA,ERRIN Take 1 tablet by mouth daily.   pantoprazole 40 MG tablet Commonly known as:  PROTONIX TAKE ONE TABLET BY MOUTH TWICE A DAY BEFORE MEALS   pioglitazone 15 MG tablet Commonly known as:  ACTOS Take 15 mg by mouth daily.   quinapril 5 MG tablet Commonly known as:  ACCUPRIL Take 5 mg by mouth every morning.   sucralfate 1 g tablet Commonly known as:  CARAFATE Take 1 tablet (1 g total) by mouth 4 (four) times daily -  with meals and at bedtime.   traZODone 50 MG tablet Commonly known as:  DESYREL Take 50 mg by mouth at bedtime.      Allergies  Allergen Reactions  . Penicillins Hives and Other (See Comments)    PATIENT HAS HAD A PCN REACTION WITH IMMEDIATE RASH, FACIAL/TONGUE/THROAT SWELLING, SOB, OR LIGHTHEADEDNESS WITH HYPOTENSION:   YES ** see below HAS PT DEVELOPED SEVERE RASH INVOLVING MUCUS MEMBRANES or SKIN NECROSIS: YES ** see below Has patient had a PCN reaction that required hospitalization No Has patient had a PCN reaction occurring within the last 10 years: No  **TOLERATED ZOSYN 12/12/17   Follow-up Information    [facility] RESPIRATORY THERAPY Follow up on 01/22/2018.   Specialty:  Respiratory Therapy Why:  trach clinic w/ pete babcock NP 12 noon Contact information: [address] [id number] [place] [phone]           The results of significant diagnostics from this hospitalization (including imaging, microbiology, ancillary and laboratory) are listed below for reference.    Significant Diagnostic Studies: Dg Chest 2 View  Result Date: 12/22/2017 CLINICAL DATA:  Shortness of breath. EXAM: CHEST - 2 VIEW COMPARISON:  Chest x-ray dated December 09, 2017. FINDINGS: Unchanged tracheostomy tube. Stable  cardiomediastinal silhouette. Normal pulmonary vascularity. No focal consolidation, pleural effusion, or pneumothorax. No acute osseous abnormality. IMPRESSION: No active cardiopulmonary disease. Electronically Signed   By: Titus Dubin M.D.   On: 12/22/2017 17:26   Ct Soft Tissue Neck W Contrast  Addendum Date: 12/10/2017   ADDENDUM REPORT: 12/10/2017 11:17 ADDENDUM: Study discussed by telephone with Dr. Nita Sells on 12/10/2017 at 1102 hours. Electronically Signed   By: Genevie Ann M.D.   On: 12/10/2017 11:17   Result Date: 12/10/2017 CLINICAL DATA:  42 year old female with moderate amounts of foul smelling tan secretions draining from the tracheostomy site. 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 EXAM: CT NECK WITH CONTRAST TECHNIQUE: Multidetector CT imaging of the neck was performed using the standard protocol following the bolus administration of intravenous contrast. CONTRAST:  51mL ISOVUE-300 IOPAMIDOL (  ISOVUE-300) INJECTION 61% COMPARISON:  Head CT without contrast 11/20/2017. Cervical spine MRI 07/15/2013. FINDINGS: Large body habitus. Pharynx and larynx: Tracheostomy tube in place. There is a small volume of fluid and gas along the caudal aspect of the tracheostomy incision best seen on sagittal image 45. This appears to be contained within the incision site, with no associated rim enhancing fluid collection. Above the tracheostomy the glottis is closed. The oropharynx is largely effaced. Negative nasopharynx aside from mild symmetric appearing adenoid hypertrophy. The superior parapharyngeal and retropharyngeal spaces are normal. The lower retropharyngeal space remains normal. However, in the left supraglottic space there are two gas and fluid containing collections which appear to communicate and encompasses an area of 24 x 14 x 28 millimeters. These do not appear to be within the  lumen of the left piriform sinus, but rather appear pathologic in the left lateral larynx potential space subjacent to the left thyroid cartilage. See series 3, image 56 and sagittal image 54. No thyroid cartilage or other laryngeal cartilage destruction is evident. Salivary glands: Negative. Thyroid: Within normal limits. Lymph nodes: Mild asymmetric enlargement of the left level 2 and level 3 lymph nodes, but these maintain a physiologic fatty hilum (series 3, image 42). Other lymph node stations appear normal. No cystic or necrotic nodes. Vascular: Suboptimal intravascular contrast bolus, but the major vascular structures in the neck and at the skull base appear to remain patent. Limited intracranial: Negative. Visualized orbits: Negative. Mastoids and visualized paranasal sinuses: Clear. Skeleton: Occasional carious dentition. Cervical spine degeneration. No acute osseous abnormality identified. Upper chest: Mediastinal lipomatosis. Small right paratracheal lymph nodes. No superior mediastinal inflammation is evident. Negative lung apices. IMPRESSION: 1. A left lateral supraglottic laryngeal Abscess is suspected (sagittal image 54) with pockets of low-density fluid and gas subjacent to the left thyroid cartilage. This measures up to 2.8 cm. No cartilage destruction. I considered but do not suspect that this is simply trapped fluid and gas in the left piriform sinus. 2. Otherwise there is only a small volume of gas and fluid located within the inferior tracheostomy incision site (sagittal image 45). 3. Mild reactive appearing left level II/III and right paratracheal lymph nodes. 4. Otherwise negative neck CT. Electronically Signed: By: Genevie Ann M.D. On: 12/10/2017 10:48   Dg Chest Port 1 View  Result Date: 12/09/2017 CLINICAL DATA:  Pneumonia EXAM: PORTABLE CHEST 1 VIEW COMPARISON:  12/06/2017; 11/25/2017; 11/10/2017 FINDINGS: Grossly unchanged cardiac silhouette and mediastinal contours. Stable positioning of  support apparatus. Improved aeration of lung bases without new focal airspace opacity. No pleural effusion or pneumothorax. No evidence of edema. No acute osseous abnormalities. IMPRESSION: Improved aeration of the lung bases without superimposed acute cardiopulmonary disease on this AP portable examination. Further evaluation with a PA and lateral chest radiograph may be obtained as clinically indicated. Electronically Signed   By: Sandi Mariscal M.D.   On: 12/09/2017 18:28    Microbiology: No results found for this or any previous visit (from the past 240 hour(s)).   Labs: Basic Metabolic Panel: Recent Labs  Lab 01/03/18 0848  NA 137  K 4.2  CL 104  CO2 21*  GLUCOSE 160*  BUN 15  CREATININE 0.78  CALCIUM 9.3   Liver Function Tests: Recent Labs  Lab 01/03/18 0848  AST 20  ALT 24  ALKPHOS 75  BILITOT 0.6  PROT 6.7  ALBUMIN 3.1*   No results for input(s): LIPASE, AMYLASE in the last 168 hours. No results for input(s): AMMONIA in  the last 168 hours. CBC: Recent Labs  Lab 01/03/18 0848  WBC 7.1  NEUTROABS 4.6  HGB 11.8*  HCT 37.6  MCV 91.5  PLT 383   Cardiac Enzymes: No results for input(s): CKTOTAL, CKMB, CKMBINDEX, TROPONINI in the last 168 hours. BNP: BNP (last 3 results) Recent Labs    12/22/17 0259  BNP 33.5    ProBNP (last 3 results) No results for input(s): PROBNP in the last 8760 hours.  CBG: Recent Labs  Lab 01/06/18 0636 01/06/18 1159 01/06/18 1629 01/06/18 2130 01/07/18 0638  GLUCAP 116* 138* 118* 75 108*       Signed:  Nita Sells MD   [facility] 01/07/2018, 10:11 AM

## 2018-01-07 NOTE — Care Management Note (Signed)
Case Management Note  Patient Details  Name: [name] MRN: [id number] Date of Birth: 07-08-1976  Subjective/Objective:                    Action/Plan: Pt is discharging to [facility] today. CM singing off.    Expected Discharge Date:  01/07/18               [facility]  In-House Referral:  Clinical Social Work  Discharge planning Services  CM Consult  Post Acute Care Choice:    Choice offered to:     DME Arranged:    DME Agency:     HH Arranged:    [place] Agency:     Status of Service:  Completed, signed off  If discussed at [employer], dates discussed:    Additional Comments:  Pollie Friar, RN 01/07/2018, 10:11 AM

## 2018-01-07 NOTE — Plan of Care (Signed)
Adequate for discharge.

## 2018-01-07 NOTE — Progress Notes (Signed)
Discharge to: [facility] Anticipated discharge date: 01/07/18 Transportation by: Delma Freeze following  Report #: [phone]  CSW signing off.  Laveda Abbe LCSW [phone]

## 2018-01-22 ENCOUNTER — Ambulatory Visit (HOSPITAL_COMMUNITY): Payer: Self-pay

## 2018-02-05 ENCOUNTER — Ambulatory Visit (HOSPITAL_COMMUNITY)
Admission: RE | Admit: 2018-02-05 | Discharge: 2018-02-05 | Disposition: A | Discharge status: Home / Self Care | Payer: Medicaid Other | Source: Ambulatory Visit | Attending: Acute Care | Admitting: Acute Care

## 2018-02-05 ENCOUNTER — Telehealth: Payer: Self-pay

## 2018-02-05 DIAGNOSIS — J386 Stenosis of larynx: Secondary | ICD-10-CM | POA: Insufficient documentation

## 2018-02-05 DIAGNOSIS — Z930 Tracheostomy status: Secondary | ICD-10-CM

## 2018-02-05 DIAGNOSIS — J9601 Acute respiratory failure with hypoxia: Secondary | ICD-10-CM

## 2018-02-05 DIAGNOSIS — J384 Edema of larynx: Secondary | ICD-10-CM | POA: Diagnosis not present

## 2018-02-05 DIAGNOSIS — G4730 Sleep apnea, unspecified: Secondary | ICD-10-CM | POA: Diagnosis not present

## 2018-02-05 NOTE — Telephone Encounter (Signed)
Per Marni Griffon, NP   Need the following ASAP   # 30 trach care kits/refill as needed  Size 12 french suction catheters # 30/refills as needed  # 30 internal cannulas for size 6 shiley XLT/ refills as needed  # 1 suction machine  # 1 size 6 proximal XLT and a size 5 proximal XLT   Oxgen: needs 30% humidified trach collar at HS.   Please also arrange for home health RT to follow the patient   I would like you to change to adult pediatric services (the main contact w/ adult pediatric services is: [phone]   and have them place this as a high priority.   [phone] OR [phone] These are the contact # for the patient and sig other   Also: third contact # [phone]   Thanks!  pete   -----------------  I have placed order to DME company.

## 2018-02-05 NOTE — Progress Notes (Signed)
Tracheostomy Procedure Note  [name] [id number] 27-Nov-1975  Pre Procedure Tracheostomy Information  Trach Brand: Shiley Size: 6.0 Style: Proximal and Uncuffed Secured by: Velcro   Procedure: trach change    Post Procedure Tracheostomy Information  Trach Brand: Shiley Size: 6.0 Style: Proximal and Uncuffed Secured by: Velcro   Post Procedure Evaluation:  ETCO2 positive color change from yellow to purple : Yes.   Vital signs:blood pressure 142/71, pulse 96, respirations 20 and pulse oximetry 98 % Patients current condition: stable Complications: No apparent complications Trach site exam: clean, dry Wound care done: dry, clean and 4 x 4 gauze Patient did tolerate procedure well.   Education: none  Prescription needs:  Lurline Idol supplies ordered   Additional needs: none

## 2018-02-05 NOTE — Progress Notes (Signed)
[facility]   Reason for visit: Routine trach care   HPI: This is a 42 year old female patient with a significant history of morbid obesity,Sleep apnea,.  Recently discharged back in following a prolonged hospital stay after drug overdose.  She had a prolonged ventilation requirements and ultimately required tracheostomy.  Her ICU course was complicated by upper airway obstruction due to laryngeal edema and subglottic stenosis because of this she was followed by ENT.  Her last direct laryngoscopy T noted irregular granular swollen tissue with some fibrinous exudate and ongoing airway obstruction.  She was ultimately discharged from the hospital with a proximal returns to the tracheostomy clinic for tracheostomy care.  She is yet to have her outpatient follow-up with ENT.  Her last note by ENT for prolonged and perhaps lifelong tracheostomy requirement depending on the course of healing.  ROS General: Fevers, chills, change in activity HEENT: No headache, nasal congestion, sore throat, significant tracheal discharge. Pulmonary: Denies worsening short of breath, in fact activity tolerance improving she does have a cough however also has concomitant reflux which is poorly controlled Cardiac: Denies or orthopnea  Extremities: Denies focal weakness ambulatory GI: Or vomiting.  Has had intentional weight loss Neuro: No headaches or focal weakness Vital signs: Heart rate: 96 blood pressure 142/71 respirations 20 saturations 98% on room air  Exam: General: This is an obese 42 year old white female who is ambulatory and walked in the tracheostomy clinic  HEENT: Neck is large, she has a size 6 proximal XLT tracheostomy she phonates well with Passy-Muir valve stoma is unremarkable Pulmonary: Clear to auscultation without accessory use Cardiac: Regular rate and rhythm without murmur rub or gallop Extremities: No significant edema strong pulses Abdomen: Soft nontender no  organomegaly Neuro/psych no focal deficits  Trach change/procedure  The existing size 6 proximal tracheostomy was removed without difficulty the stoma site was inspected and was unremarkable.  The site was localized with viscous lidocaine jelly, and a size 6 proximal XLT was easily replaced without difficulty.  Placement was verified by CO2 change patient tolerated well   Impression/dx Tracheostomy dependent  severe obesity hypoventilation syndrome Severe sleep apnea Morbid obesity Upper airways stenosis, subglottic stenosis  Discussion  Discomfort he is doing well from a tracheostomy standpoint.  He has lost 40 pounds which is encouraging for her overall health.  She is supposed to follow-up ENT at some point, I am not convinced she is a good decannulation candidate given her morbid obesity, and I suspect inadequate CPAP support at home.  I believe 2 things would need to happen prior to decannulation consideration.  #1 would be she would need to be seen by ENT and cleared from a airway obstruction standpoint for safe removal of trach and #2 would need repeat PSG and titration study to ensure adequate CPAP support.   Plan Cont routine trach care w/ follow-up planned for 12 weeks Have ordered DME supplies I will message ENT to see what follow up they would like to re-evaluate her airway.     Visit time: 45 minutes.   Erick Colace ACNP-BC [place]

## 2018-02-05 NOTE — Telephone Encounter (Signed)
-----   Message from Erick Colace, NP sent at 02/05/2018 11:07 AM EDT ----- Regarding: trach supplies Hey guys,  Need the following ASAP  # 30 trach care kits/refill as needed Size 12 french suction catheters # 30/refills as needed # 30 internal cannulas for size 6 shiley XLT/ refills as needed # 1 suction machine # 1 size 6 proximal XLT and a size 5 proximal XLT  Oxgen: needs 30% humidified trach collar at HS.   Please also arrange for home health RT to follow the patient  I would like you to change to adult pediatric services (the main contact w/ adult pediatric services is: [phone]  and have them place this as a high priority.   [phone] OR [phone]  These are the contact # for the patient and sig other  Also: third contact # [phone]  Thanks! pete

## 2018-02-10 ENCOUNTER — Encounter (HOSPITAL_COMMUNITY): Payer: Self-pay | Admitting: Emergency Medicine

## 2018-02-10 ENCOUNTER — Emergency Department (HOSPITAL_COMMUNITY)
Admission: EM | Admit: 2018-02-10 | Discharge: 2018-02-10 | Disposition: A | Discharge status: Home / Self Care | Payer: Medicaid Other | Attending: Emergency Medicine | Admitting: Emergency Medicine

## 2018-02-10 DIAGNOSIS — Z79899 Other long term (current) drug therapy: Secondary | ICD-10-CM | POA: Diagnosis not present

## 2018-02-10 DIAGNOSIS — Z9981 Dependence on supplemental oxygen: Secondary | ICD-10-CM | POA: Diagnosis not present

## 2018-02-10 DIAGNOSIS — J9500 Unspecified tracheostomy complication: Secondary | ICD-10-CM | POA: Insufficient documentation

## 2018-02-10 DIAGNOSIS — E1140 Type 2 diabetes mellitus with diabetic neuropathy, unspecified: Secondary | ICD-10-CM | POA: Insufficient documentation

## 2018-02-10 DIAGNOSIS — Z7984 Long term (current) use of oral hypoglycemic drugs: Secondary | ICD-10-CM | POA: Diagnosis not present

## 2018-02-10 DIAGNOSIS — E079 Disorder of thyroid, unspecified: Secondary | ICD-10-CM | POA: Insufficient documentation

## 2018-02-10 DIAGNOSIS — I10 Essential (primary) hypertension: Secondary | ICD-10-CM | POA: Insufficient documentation

## 2018-02-10 DIAGNOSIS — F17210 Nicotine dependence, cigarettes, uncomplicated: Secondary | ICD-10-CM | POA: Insufficient documentation

## 2018-02-10 NOTE — ED Provider Notes (Signed)
[facility] DEPT Provider Note   CSN: [id number] Arrival date & time: 02/10/18  0900     History   Chief Complaint Chief Complaint  Patient presents with  . trach issues    HPI [name] is a 42 y.o. female.  HPI 42 year old female with a history of morbid obesity and recent tracheostomy tube placement for obstructive sleep apnea and recent intentional overdose.  She wears oxygen through this at night.  She speaks with a Passy-Muir valve.  She feels like she is not getting air through her trach at this time.  No significant increase in secretions.  No fevers or chills.   Past Medical History:  Diagnosis Date  . Anxiety   . Complication of anesthesia    Pt. states takes long time to wake up from it.   . Cushing's disease ([place])   . Depression   . Diabetes ([place])   . Hyperlipidemia   . Hyperlipidemia   . Hypertension   . Morbid obesity ([place])   . Osteoporosis 07/19/2015  . Periprosthetic fracture 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[place]), R tibial plateau  07/18/2015  . Sleep apnea   . Uncontrolled diabetes mellitus with diabetic neuropathy, with long-term current use of insulin ([place]) 07/16/2015  . Vitamin D deficiency 07/19/2015    Patient Active Problem List   Diagnosis Date Noted  . MDD (major depressive disorder), recurrent episode, moderate ([place])   . Acute respiratory failure with hypoxemia ([place])   . SOB (shortness of breath)   . Tracheostomy status ([place])   . Hoarse voice quality   . Hypomagnesemia   . Shortness of breath   . Major depressive disorder, recurrent episode, severe ([place])   . Intentional overdose of drug in tablet form ([place]) 11/20/2017  . Intentional drug overdose ([place])   . Seizure ([place])   . Acute respiratory failure with hypercapnia ([place])   . Status epilepticus ([place])   . Spondylosis without myelopathy or radiculopathy, cervical region 10/29/2017  . Abdominal pain, epigastric   . LUQ abdominal pain   . Nausea and  vomiting   . Gastroparesis   . Hypertriglyceridemia 07/22/2015  . Diabetes mellitus due to underlying condition, uncontrolled, with diabetic neuropathy, with long-term current use of insulin ([place])   . Vitamin D deficiency 07/19/2015  . 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 Osteoporosis 07/19/2015  . Periprosthetic fracture 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[place]), R tibial plateau  07/18/2015  . Fracture, tibial plateau 07/16/2015  . Uncontrolled diabetes mellitus with diabetic neuropathy, with long-term current use of insulin ([place]) 07/16/2015  . Leukocytosis 07/16/2015  . Essential hypertension 07/16/2015  . Morbid obesity ([place]) 04/20/2014  . Hyperlipidemia 07/31/2012  . Cushing disease ([place]) 07/31/2012  . Hypothyroid 07/31/2012    Past Surgical History:  Procedure Laterality Date  . ANTERIOR TALOFIBULAR LIGAMENT REPAIR Left 11/15/2014   Procedure: ANTERIOR TALOFIBULAR LIGAMENT REPAIR;  Surgeon: Jana Half, DPM;  Location: [facility];  Service: Podiatry;  Laterality: Left;  . CESAREAN SECTION  dec 1997/  06-03-2001/   01-01-2005   BILATERAL TUBAL LIGATION WITH LAST ONE  . DILATION AND CURETTAGE OF UTERUS  1995   WITH SUCTION  . DIRECT LARYNGOSCOPY N/A 12/18/2017   Procedure: DIRECT LARYNGOSCOPY;  Surgeon: Izora Gala, MD;  Location: [facility];  Service: ENT;  Laterality: N/A;  . ESOPHAGOGASTRODUODENOSCOPY (EGD) WITH PROPOFOL N/A 10/04/2016   Procedure: ESOPHAGOGASTRODUODENOSCOPY (EGD) WITH PROPOFOL;  Surgeon: [name], MD;  Location: [facility];  Service: Endoscopy;  Laterality: N/A;  . LAPAROSCOPIC CHOLECYSTECTOMY  09-25-2005  . ORIF TIBIA PLATEAU Right 07/19/2015   Procedure: OPEN REDUCTION INTERNAL FIXATION (ORIF) RIGHT TIBIAL PLATEAU;  Surgeon: Altamese Churubusco, MD;  Location: [facility];  Service: Orthopedics;  Laterality: Right;  . PARTIAL KNEE ARTHROPLASTY Right 04/19/2014   Procedure: RIGHT UNI KNEE ARTHROPLASTY  MEDIALLY ;  Surgeon: Mauri Pole, MD;  Location: [facility];  Service: Orthopedics;  Laterality: Right;  . TRACHEOSTOMY TUBE PLACEMENT N/A 11/28/2017   Procedure: TRACHEOSTOMY;  Surgeon: Izora Gala, MD;  Location: [facility];  Service: ENT;  Laterality: N/A;  . TUBAL LIGATION       OB History   None      Home Medications    Prior to Admission medications   Medication Sig Start Date End Date Taking? Authorizing Provider  acetaminophen (TYLENOL) 325 MG tablet Take 2 tablets (650 mg total) by mouth every 6 (six) hours as needed for mild pain or headache. 01/06/18   Nita Sells, MD  albuterol (PROVENTIL HFA;VENTOLIN HFA) 108 (90 Base) MCG/ACT inhaler Inhale 2 puffs into the lungs every 6 (six) hours as needed for wheezing or shortness of breath. 01/07/18   Nita Sells, MD  atorvastatin (LIPITOR) 20 MG tablet Take 20 mg by mouth at bedtime.    [provider]  buPROPion (WELLBUTRIN SR) 100 MG 12 hr tablet Take 100 mg by mouth 2 (two) times daily.    [provider]  cholestyramine (QUESTRAN) 4 g packet Take 1 packet (4 g total) by mouth every other day. 01/08/18   Nita Sells, MD  insulin NPH-regular Human (NOVOLIN 70/30) (70-30) 100 UNIT/ML injection Inject 28 Units into the skin 2 (two) times daily with a meal. Patient taking differently: Inject 50 Units into the skin at bedtime.  07/21/15   Delfina Redwood, MD  levothyroxine (SYNTHROID, LEVOTHROID) 75 MCG tablet Take 1 tablet (75 mcg total) by mouth daily before breakfast. 01/07/18   Nita Sells, MD  metFORMIN (GLUCOPHAGE) 1000 MG tablet Take 1,000 mg by mouth 2 (two) times daily with a meal.    [provider]  metoprolol tartrate (LOPRESSOR) 25 MG tablet Take 0.5 tablets (12.5 mg total) by mouth 2 (two) times daily. 01/06/18   Nita Sells, MD  naproxen (NAPROSYN) 500 MG tablet Take 1 tablet (500 mg total) by mouth 2 (two) times daily. 10/31/16   Tanna Furry, MD  norethindrone  (MICRONOR,CAMILA,ERRIN) 0.35 MG tablet Take 1 tablet by mouth daily.    [provider]  pantoprazole (PROTONIX) 40 MG tablet TAKE ONE TABLET BY MOUTH TWICE A DAY BEFORE MEALS 04/01/17   [name], MD  pioglitazone (ACTOS) 15 MG tablet Take 15 mg by mouth daily.    [provider]  quinapril (ACCUPRIL) 5 MG tablet Take 5 mg by mouth every morning.     [provider]  sucralfate (CARAFATE) 1 g tablet Take 1 tablet (1 g total) by mouth 4 (four) times daily -  with meals and at bedtime. 08/25/16   Theodosia Quay, MD  traZODone (DESYREL) 50 MG tablet Take 50 mg by mouth at bedtime.    [provider]    Family History Family History  Adopted: Yes  Problem Relation Age of Onset  . Autism Son   . ADD / ADHD Son   . Apraxia Son     Social History Social History   Tobacco Use  . Smoking status: Current Every Day Smoker  Years: 4.00    Types: Cigarettes  . Smokeless tobacco: Never Used  Substance Use Topics  . Alcohol use: Yes    Comment: RARE  . Drug use: No    Types: Marijuana     Allergies   Penicillins   Review of Systems Review of Systems  All other systems reviewed and are negative.    Physical Exam Updated Vital Signs BP (!) 157/79 (BP Location: Right Arm)   Pulse (!) 112   Temp 98.1 F (36.7 C)   Resp (!) 24   Ht 5\' 2"  (1.575 m)   Wt 131.5 kg (290 lb)   SpO2 95%   BMI 53.04 kg/m   Physical Exam  Constitutional: She is oriented to person, place, and time. She appears well-developed and well-nourished.  Morbidly obese  HENT:  Head: Normocephalic.  6-0 XLT trach in place, no end tidal color change noted.   Eyes: EOM are normal.  Neck: Normal range of motion.  Pulmonary/Chest: Effort normal.  Abdominal: She exhibits no distension.  Musculoskeletal: Normal range of motion.  Neurological: She is alert and oriented to person, place, and time.  Psychiatric: She has a normal mood and affect.  Nursing note and  vitals reviewed.    ED Treatments / Results  Labs (all labs ordered are listed, but only abnormal results are displayed) Labs Reviewed - No data to display  EKG None  Radiology No results found.  Procedures Fiberoptic laryngoscopy/bronchoscopy Performed by: Jola Schmidt, MD Authorized by: Jola Schmidt, MD    Fiberoptic scope was placed through the patient's tracheostomy tube resulting in direct visualization of acute occlusion secondary to dry mucous.  Suction was applied but this was unable to be removed via suction and therefore it was pushed through the trach with resolution of the occlusion.  Patient tolerated the procedure well.  No hypoxia.  No complications.  Patient feels much better.    Medications Ordered in ED Medications - No data to display   Initial Impression / Assessment and Plan / ED Course  I have reviewed the triage vital signs and the nursing notes.  Pertinent labs & imaging results that were available during my care of the patient were reviewed by me and considered in my medical decision making (see chart for details).    Acute occlusion of the patient's trach.  Patient tolerated the procedure well.  She feels much better at this time.  Vital signs are stable.  Discharged home in good condition.  Recommended humidified air  Final Clinical Impressions(s) / ED Diagnoses   Final diagnoses:  Tracheostomy complication, unspecified complication type [facility])    ED Discharge Orders    None       Jola Schmidt, MD 02/10/18 1055

## 2018-02-10 NOTE — ED Notes (Signed)
Bed: WA03 Expected date:  Expected time:  Means of arrival:  Comments: 

## 2018-02-10 NOTE — ED Notes (Signed)
Discharge instructions reviewed with patient. Patient verbalizes understanding. VSS.   

## 2018-02-10 NOTE — ED Triage Notes (Signed)
Pt husband states that yesterday started having issues with trach. Reports that she can talk without having to wear voice box, knows there is a problem. Made PCP aware and was instructed to come to ED due to possible shift and he is on call today at [facility].

## 2018-02-10 NOTE — ED Notes (Signed)
RT and Dr Venora Maples at patient bedside to change out trach

## 2018-02-11 ENCOUNTER — Telehealth: Payer: Self-pay | Admitting: Acute Care

## 2018-02-11 DIAGNOSIS — Z930 Tracheostomy status: Secondary | ICD-10-CM

## 2018-02-11 NOTE — Telephone Encounter (Signed)
Erick Colace, NP  P [facility] Triage Pool        Hey guys   Can you send to following orders to this patients DME Adult and pediatric services:   #30 4X4 trach dressing/drain gauze  (refill monthly X 12)  #10 trach ties, change weekly and PRN (refill monthly q 12)  # 1 Passe muir valve (refill X 4)   Thanks  Pete   ---------------------------------------------------------- Orders placed per [facility]. Nothing further was needed.

## 2018-03-07 ENCOUNTER — Telehealth: Payer: Self-pay | Admitting: Acute Care

## 2018-03-07 NOTE — Telephone Encounter (Signed)
Call from patient's spouse requesting assistance w/ O2 for an upcoming beach trip on this Monday 7/1.  The patient is on 2lpm continuous ([facility]/[employer] [place]), and 2lpm bled through CPAP ([employer] [place]) at bedtime - all of this was ordered via hospital discharge in May 2019.  Patient has never been seen in the office but is a patient at the trach clinic [place] clinic advised pt call this office for assistance  Per hospital records, PCCM did round on patient >> Dr Nelda [place], Dr Lamonte Sakai and [facility]  Maxwell Caul and spoke with Iona Beard who reported that he can get in touch : arrival date, departure date, address, and contact phone.   LMOM TCB x1 to inform them of my findings from [place] Patient also needs an appt in this office to establish if she is not already seeing someone for pulmonary and sleep

## 2018-03-10 NOTE — Telephone Encounter (Signed)
Attempted to call pt. I did not receive an answer. I have left a message for pt to return our call.  

## 2018-03-11 NOTE — Telephone Encounter (Signed)
Attempted to call pt's spouse [name] but unable to reach him.  Left a message for [name] to return call.  We need to see if pt is established with a sleep and pulmonary doctor and if not, pt needs to have a consult appt made with one of our sleep and pulmonary doctors.

## 2018-03-12 NOTE — Telephone Encounter (Signed)
Called and spoke to pt and relayed below message.  Pt stated she did not received a call from [facility] regarding delivery for O2 while at the beach. Pt stated she traveled to the beach with her tanks from home. Pt stated she does not currently have a pulmonary/sleep doctor.  Pt stated she will call back to schedule OV.  Will await call back.  Nothing further is needed at this time.

## 2018-04-02 ENCOUNTER — Ambulatory Visit (HOSPITAL_COMMUNITY)
Admission: RE | Admit: 2018-04-02 | Discharge: 2018-04-02 | Disposition: A | Discharge status: Home / Self Care | Payer: Medicaid Other | Source: Ambulatory Visit | Attending: Acute Care | Admitting: Acute Care

## 2018-04-02 ENCOUNTER — Telehealth: Payer: Self-pay | Admitting: *Deleted

## 2018-04-02 DIAGNOSIS — G4733 Obstructive sleep apnea (adult) (pediatric): Secondary | ICD-10-CM | POA: Insufficient documentation

## 2018-04-02 DIAGNOSIS — Z930 Tracheostomy status: Secondary | ICD-10-CM

## 2018-04-02 NOTE — Progress Notes (Signed)
[facility]   Reason for visit: Difficulty suctioning tracheostomy Possible tracheostomy change  HPI: 42 year old  obese patient well-known to me, I follow her for tracheostomy management.  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  I manage her tracheostomy in collaboration with [facility], she is followed by Dr. Izora Gala.  I reviewed her last note from ENT which was on 02/11/2018, during that time she had flexible laryngoscopy findings were essentially normal up until the vocal fold cords which were diminished but symmetric.  She has follow-up with ENT in August.  From a pulmonary standpoint she is making slow progress.  She has lost about 45 pounds since leaving the hospital, she still remains anxious, gets very anxious with shortness of breath or cough.  Her husband called me earlier in the week reporting increased tracheal secretions after recent trip to the beach, and also noting it to be more difficult suctioning, furthermore noting that she can phonate without the Passy-Muir valve on.  They were concerned about the tracheostomy being either malpositioned or obstructed so I asked her to come in early instead of her planned visit in August.  She presents to the tracheostomy clinic in no acute distress.  She is able to phonate well with Passy-Muir valve, she presents for evaluation of tracheostomy and probable tracheostomy change  ROS General: No fever, chills, sick exposures.  HEENT: Has had some nasal congestion, postnasal drip, and somewhat increase in cough since her recent trip to the beach.  \Able to phonate even with tracheostomy Passy-Muir valve off.  Has had some increase in secretions however mostly clear.  She is having difficulty passing suction catheter.  Pulmonary: Occasional cough,  no shortness of breath, no wheezing.  Cardiac: No palpitations or chest pain.  Musculoskeletal: No focal weakness, no swelling.  Brain: No nausea vomiting diarrhea GU: No dysuria, quinsy or hesitancy neuro: No headache, focal weakness, or gait disturbance.  No memory changes.  Vital signs: Pulse rate 100 saturations 98% on room air respirations 22 blood pressure 133/74 Exam: General: 42 year old morbidly obese female ambulatory, walked into pulmonary/tracheostomy clinic.  HEENT neck is large, she has a size 6 proximal XLT tracheostomy in place.  She is able to phonate well with PMV.  Her cough is strong.  Tracheostomy stoma is unremarkable. Pulmonary: Clear to auscultation diminished bases no accessory use. Cardiac: Regular rate and rhythm without murmur rub or gallop Abdomen: Soft, nontender, positive bowel sounds.  Obese. Extremities: Brisk cap refill warm, dry, trunk pulses. Neuro: Awake, oriented, no focal deficits  Trach change/procedure The size 6 tracheostomy was removed without difficulty.  The tracheostomy stoma was evaluated and found to be unremarkable.  Lidocaine jelly was applied to the tracheostomy stoma site as well as tracheostomy.  Of note after removal of the tracheostomy, the inner cannula was found to be occluded almost completely with dried mucus.  I suspect this is the etiology of her difficulty suctioning, and certainly why she was able to phonate with the Passy-Muir valve off.  The size #6 proximal XLT tracheostomy was then placed without difficulty placement was verified by easy Check, patient tolerated well, trach straps applied.   Impression/dx Severe sleep apnea Morbid obesity Tracheostomy dependence Subglottic stenosis Tracheostomy malfunction   Discussion 42 year old morbidly obese female now tracheostomy dependent following prolonged critical illness.  It does appear as  though from a vocal cord standpoint she is made pretty significant improvement.  However from  a underlying pulmonary standpoint I am not convinced we should pursue decannulation anytime soon.  Another visit with ENT soon.  Given her comorbid's I think she might even benefit from repeat polysomnogram, and titration evaluation with CPAP prior to considering decannulation.  I think she is more likely to stay healthy with a tracheostomy, however if she can improve compliance with CPAP we could consider removing  Plan Return office visit in 8 to 10 weeks Continue routine tracheostomy care We will continue to evaluate for possibility of decannulation, should we decide to pursue this she needs repeat PSG and titration study, I will set her up with pulmonology if she wants to evaluate this  Visit time: 32  minutes.   Erick Colace ACNP-BC [place]  ;

## 2018-04-02 NOTE — Progress Notes (Signed)
Tracheostomy Procedure Note  EMMAH BRATCHER [id number] Nov 14, 1975  Pre Procedure Tracheostomy Information  Trach Brand: Shiley Size: 6.0 Style: Proximal and Uncuffed Secured by: Velcro   Procedure: trach change    Post Procedure Tracheostomy Information  Trach Brand: Shiley Size: 6.0 Style: Proximal and Uncuffed Secured by: Velcro   Post Procedure Evaluation:  ETCO2 positive color change from yellow to purple : Yes.   Vital signs:blood pressure 133/74, pulse 100, respirations 22 and pulse oximetry 98 % Patients current condition: stable Complications: No apparent complications Trach site exam: clean, dry Wound care done: dry, sterile and 4 x 4 gauze Patient did tolerate procedure well.   Education: none  Prescription needs: none    Additional needs: 8 week follow up

## 2018-04-02 NOTE — Telephone Encounter (Signed)
-----   Message from Erick Colace, NP sent at 04/02/2018  1:04 PM EDT ----- [name]  Will you please send adult pediatric services an order for a: shiley tracheostomy  Size # 5 Proximal XLT   (this will be her backup trach.   Thanks! pete

## 2018-04-06 ENCOUNTER — Encounter (HOSPITAL_COMMUNITY): Payer: Self-pay

## 2018-04-06 ENCOUNTER — Emergency Department (HOSPITAL_COMMUNITY)
Admission: EM | Admit: 2018-04-06 | Discharge: 2018-04-06 | Disposition: A | Discharge status: Home / Self Care | Payer: Medicaid Other | Attending: Emergency Medicine | Admitting: Emergency Medicine

## 2018-04-06 ENCOUNTER — Other Ambulatory Visit: Payer: Self-pay

## 2018-04-06 DIAGNOSIS — F17210 Nicotine dependence, cigarettes, uncomplicated: Secondary | ICD-10-CM | POA: Diagnosis not present

## 2018-04-06 DIAGNOSIS — Z794 Long term (current) use of insulin: Secondary | ICD-10-CM | POA: Insufficient documentation

## 2018-04-06 DIAGNOSIS — E039 Hypothyroidism, unspecified: Secondary | ICD-10-CM | POA: Insufficient documentation

## 2018-04-06 DIAGNOSIS — Z79899 Other long term (current) drug therapy: Secondary | ICD-10-CM | POA: Insufficient documentation

## 2018-04-06 DIAGNOSIS — E1140 Type 2 diabetes mellitus with diabetic neuropathy, unspecified: Secondary | ICD-10-CM | POA: Diagnosis not present

## 2018-04-06 DIAGNOSIS — E119 Type 2 diabetes mellitus without complications: Secondary | ICD-10-CM | POA: Diagnosis not present

## 2018-04-06 DIAGNOSIS — I10 Essential (primary) hypertension: Secondary | ICD-10-CM | POA: Diagnosis not present

## 2018-04-06 DIAGNOSIS — J9503 Malfunction of tracheostomy stoma: Secondary | ICD-10-CM | POA: Diagnosis not present

## 2018-04-06 NOTE — Progress Notes (Signed)
Pt stated that her trach was clogged the Pt was able to talk without the PMV and inner cannula out. RT put saline down her trach and was able to suction a huge hard plug from her trach. The Pt wasn't able to talk after the plug was removed and she stated that it felt much better. Pt may need an HME to go on her trach during the day while at home. The Pt was concerned with coming to the hospital in an ambulance and paying the bill. SATS 99%, RR 20, HR 104, BP 140/83. RT informed the doctor that the Pt was breathing easier

## 2018-04-06 NOTE — ED Provider Notes (Signed)
[facility] DEPT Provider Note   CSN: [id number] Arrival date & time: 04/06/18  1236     History   Chief Complaint No chief complaint on file.   HPI [name] is a 42 y.o. female.  HPI   She is here because she feels like her trach tube is clogged.  She was eating lunch around noon when she experienced a choking sensation, which was transient, but afterwards, the trach tube was clogged.  She denies shortness of breath, chest pain, weakness or dizziness at this time.  Prior similar problems.  Trach appliance was recently changed.  No recent fever, chills, nausea or vomiting.  She is here with her husband who helps give history.  There are no other known modifying factors.  Past Medical History:  Diagnosis Date  . Anxiety   . Complication of anesthesia    Pt. states takes long time to wake up from it.   . Cushing's disease ([place])   . Depression   . Diabetes ([place])   . Hyperlipidemia   . Hyperlipidemia   . Hypertension   . Morbid obesity ([place])   . Osteoporosis 07/19/2015  . Periprosthetic fracture 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[place]), R tibial plateau  07/18/2015  . Sleep apnea   . Uncontrolled diabetes mellitus with diabetic neuropathy, with long-term current use of insulin ([place]) 07/16/2015  . Vitamin D deficiency 07/19/2015    Patient Active Problem List   Diagnosis Date Noted  . Tracheostomy dependence ([place])   . Obstructive sleep apnea syndrome   . MDD (major depressive disorder), recurrent episode, moderate ([place])   . Acute respiratory failure with hypoxemia ([place])   . SOB (shortness of breath)   . Tracheostomy status ([place])   . Hoarse voice quality   . Hypomagnesemia   . Shortness of breath   . Major depressive disorder, recurrent episode, severe ([place])   . Intentional overdose of drug in tablet form ([place]) 11/20/2017  . Intentional drug overdose ([place])   . Seizure ([place])   . Acute respiratory failure with hypercapnia ([place])   . Status  epilepticus ([place])   . Spondylosis without myelopathy or radiculopathy, cervical region 10/29/2017  . Abdominal pain, epigastric   . LUQ abdominal pain   . Nausea and vomiting   . Gastroparesis   . Hypertriglyceridemia 07/22/2015  . Diabetes mellitus due to underlying condition, uncontrolled, with diabetic neuropathy, with long-term current use of insulin ([place])   . Vitamin D deficiency 07/19/2015  . 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 Osteoporosis 07/19/2015  . Periprosthetic fracture 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[place]), R tibial plateau  07/18/2015  . Fracture, tibial plateau 07/16/2015  . Uncontrolled diabetes mellitus with diabetic neuropathy, with long-term current use of insulin ([place]) 07/16/2015  . Leukocytosis 07/16/2015  . Essential hypertension 07/16/2015  . Morbid obesity due to excess calories ([place]) 04/20/2014  . Hyperlipidemia 07/31/2012  . Cushing disease ([place]) 07/31/2012  . Hypothyroid 07/31/2012    Past Surgical History:  Procedure Laterality Date  . ANTERIOR TALOFIBULAR LIGAMENT REPAIR Left 11/15/2014   Procedure: ANTERIOR TALOFIBULAR LIGAMENT REPAIR;  Surgeon: Jana Half, DPM;  Location: [facility];  Service: Podiatry;  Laterality: Left;  . CESAREAN SECTION  dec 1997/  06-03-2001/   01-01-2005   BILATERAL TUBAL LIGATION WITH LAST ONE  . DILATION AND CURETTAGE OF UTERUS  1995   WITH SUCTION  . DIRECT LARYNGOSCOPY N/A 12/18/2017   Procedure: DIRECT LARYNGOSCOPY;  Surgeon: Izora Gala, MD;  Location: [facility];  Service: ENT;  Laterality: N/A;  . ESOPHAGOGASTRODUODENOSCOPY (EGD) WITH PROPOFOL N/A 10/04/2016   Procedure: ESOPHAGOGASTRODUODENOSCOPY (EGD) WITH PROPOFOL;  Surgeon: [name], MD;  [facility];  Service: Endoscopy;  Laterality: N/A;  . LAPAROSCOPIC CHOLECYSTECTOMY  09-25-2005  . ORIF TIBIA PLATEAU Right 07/19/2015   Procedure: OPEN REDUCTION INTERNAL FIXATION (ORIF) RIGHT  TIBIAL PLATEAU;  Surgeon: Altamese [place], MD;  Location: [facility];  Service: Orthopedics;  Laterality: Right;  . PARTIAL KNEE ARTHROPLASTY Right 04/19/2014   Procedure: RIGHT UNI KNEE ARTHROPLASTY MEDIALLY ;  Surgeon: Mauri Pole, MD;  [facility];  Service: Orthopedics;  Laterality: Right;  . TRACHEOSTOMY TUBE PLACEMENT N/A 11/28/2017   Procedure: TRACHEOSTOMY;  Surgeon: Izora Gala, MD;  Location: [facility];  Service: ENT;  Laterality: N/A;  . TUBAL LIGATION       OB History   None      Home Medications    Prior to Admission medications   Medication Sig Start Date End Date Taking? Authorizing Provider  acetaminophen (TYLENOL) 325 MG tablet Take 2 tablets (650 mg total) by mouth every 6 (six) hours as needed for mild pain or headache. 01/06/18   Nita Sells, MD  albuterol (PROVENTIL HFA;VENTOLIN HFA) 108 (90 Base) MCG/ACT inhaler Inhale 2 puffs into the lungs every 6 (six) hours as needed for wheezing or shortness of breath. 01/07/18   Nita Sells, MD  atorvastatin (LIPITOR) 20 MG tablet Take 20 mg by mouth at bedtime.    [provider]  buPROPion (WELLBUTRIN SR) 100 MG 12 hr tablet Take 100 mg by mouth 2 (two) times daily.    [provider]  cholestyramine (QUESTRAN) 4 g packet Take 1 packet (4 g total) by mouth every other day. 01/08/18   Nita Sells, MD  insulin NPH-regular Human (NOVOLIN 70/30) (70-30) 100 UNIT/ML injection Inject 28 Units into the skin 2 (two) times daily with a meal. Patient taking differently: Inject 50 Units into the skin at bedtime.  07/21/15   Delfina Redwood, MD  levothyroxine (SYNTHROID, LEVOTHROID) 75 MCG tablet Take 1 tablet (75 mcg total) by mouth daily before breakfast. 01/07/18   Nita Sells, MD  metFORMIN (GLUCOPHAGE) 1000 MG tablet Take 1,000 mg by mouth 2 (two) times daily with a meal.    [provider]  metoprolol tartrate (LOPRESSOR) 25 MG tablet Take 0.5 tablets (12.5 mg total) by mouth  2 (two) times daily. 01/06/18   Nita Sells, MD  naproxen (NAPROSYN) 500 MG tablet Take 1 tablet (500 mg total) by mouth 2 (two) times daily. 10/31/16   Tanna Furry, MD  norethindrone (MICRONOR,CAMILA,ERRIN) 0.35 MG tablet Take 1 tablet by mouth daily.    [provider]  pantoprazole (PROTONIX) 40 MG tablet TAKE ONE TABLET BY MOUTH TWICE A DAY BEFORE MEALS 04/01/17   [name], MD  pioglitazone (ACTOS) 15 MG tablet Take 15 mg by mouth daily.    [provider]  quinapril (ACCUPRIL) 5 MG tablet Take 5 mg by mouth every morning.     [provider]  sucralfate (CARAFATE) 1 g tablet Take 1 tablet (1 g total) by mouth 4 (four) times daily -  with meals and at bedtime. 08/25/16   Theodosia Quay, MD  traZODone (DESYREL) 50 MG tablet Take 50 mg by mouth at bedtime.    [provider]    Family History Family History  Adopted: Yes  Problem Relation Age of Onset  .  Autism Son   . ADD / ADHD Son   . Apraxia Son     Social History Social History   Tobacco Use  . Smoking status: Current Every Day Smoker    Years: 4.00    Types: Cigarettes  . Smokeless tobacco: Never Used  Substance Use Topics  . Alcohol use: Yes    Comment: RARE  . Drug use: No    Types: Marijuana     Allergies   Penicillins   Review of Systems Review of Systems  All other systems reviewed and are negative.    Physical Exam Updated Vital Signs BP 140/83 (BP Location: Right Arm)   Pulse (!) 109   Temp 97.7 F (36.5 C) (Oral)   Resp (!) 22   SpO2 96%   Physical Exam  Constitutional: She is oriented to person, place, and time. She appears well-developed.  Overweight  HENT:  Head: Normocephalic and atraumatic.  Right Ear: External ear normal.  Left Ear: External ear normal.  Eyes: Pupils are equal, round, and reactive to light. Conjunctivae and EOM are normal.  Neck: Normal range of motion and phonation normal. Neck supple.  Tracheostomy appliance insight  appear normal externally.  Cardiovascular: Normal rate.  Pulmonary/Chest: Effort normal. She exhibits no bony tenderness.  Patient is able to verbalize without occluding tracheostomy, prior to trach care by respiratory therapist.  Abdominal: There is no tenderness.  Musculoskeletal: Normal range of motion.  Neurological: She is alert and oriented to person, place, and time. No cranial nerve deficit or sensory deficit. She exhibits normal muscle tone. Coordination normal.  Skin: Skin is warm, dry and intact.  Psychiatric: She has a normal mood and affect. Her behavior is normal. Judgment and thought content normal.  Nursing note and vitals reviewed.    ED Treatments / Results  Labs (all labs ordered are listed, but only abnormal results are displayed) Labs Reviewed - No data to display  EKG None  Radiology No results found.  Procedures Procedures (including critical care time)  Medications Ordered in ED Medications - No data to display   Initial Impression / Assessment and Plan / ED Course  I have reviewed the triage vital signs and the nursing notes.  Pertinent labs & imaging results that were available during my care of the patient were reviewed by me and considered in my medical decision making (see chart for details).  Clinical Course as of Apr 06 1349  Sun Apr 06, 2018  1324 Respiratory therapy at bedside, performing trach care, while I observed.  Saline was instilled and suctioning done, which recovered a hard gray appearing foreign body, indistinct.  Following this removal, the patient was no longer able to speak without occluding the trach ostomy.  Patient did not experience choking or shortness of breath following this maneuver.  Saline wash was additionally done with suctioning by RT.   [EW]    Clinical Course User Index [EW] Daleen Bo, MD     Patient Vitals for the past 24 hrs:  BP Temp Temp src Pulse Resp SpO2  04/06/18 1246 140/83 97.7 F (36.5 C)  Oral (!) 109 (!) 22 96 %    1:50 PM Reevaluation with update and discussion. After initial assessment and treatment, an updated evaluation reveals she is calm and comfortable has no further complaints.  Findings discussed with patient and family member, all questions answered. Daleen Bo   Medical Decision Making: Tracheostomy cannula, obstructed, removed with suctioning and saline wash.  Doubt aspiration or  tracheal foreign body at this time.   CRITICAL CARE-no Performed by: Daleen Bo   Nursing Notes Reviewed/ Care Coordinated Applicable Imaging Reviewed Interpretation of Laboratory Data incorporated into ED treatment  The patient appears reasonably screened and/or stabilized for discharge and I doubt any other medical condition or other [facility] requiring further screening, evaluation, or treatment in the ED at this time prior to discharge.  Plan: Home Medications-continue usual; Home Treatments-rest, fluids; return here if the recommended treatment, does not improve the symptoms; Recommended follow up-PCP, as needed     Final Clinical Impressions(s) / ED Diagnoses   Final diagnoses:  Mechanical complication of tracheostomy [facility])    ED Discharge Orders    None       Daleen Bo, MD 04/06/18 1351

## 2018-04-06 NOTE — ED Triage Notes (Signed)
Patient presented to ed with c/of clogged trach tube. Patient state she is having difficulties breathing than normal. Pt also state she is able to speak with out passy muir valve on.

## 2018-04-06 NOTE — ED Notes (Signed)
Bed: WA20 Expected date:  Expected time:  Means of arrival:  Comments: fall

## 2018-04-06 NOTE — ED Notes (Signed)
Respiratory has been called.

## 2018-04-06 NOTE — Discharge Instructions (Addendum)
Follow-up with your medical providers as needed and planned.

## 2018-04-08 ENCOUNTER — Telehealth: Payer: Self-pay | Admitting: *Deleted

## 2018-04-08 DIAGNOSIS — Z930 Tracheostomy status: Secondary | ICD-10-CM

## 2018-04-08 NOTE — Telephone Encounter (Signed)
Order placed Nothing further needed; will sign off

## 2018-04-08 NOTE — Telephone Encounter (Signed)
-----   Message from Erick Colace, NP sent at 04/08/2018  3:32 PM EDT ----- Regarding: DME order Hessie Dibble  Can you place a DME order for this patient.  She needs humidified/aersoled  trach collar 28% oxygen At HS  -DME is [facility]  Thanks!  Laurey Arrow

## 2018-04-14 ENCOUNTER — Other Ambulatory Visit: Payer: Self-pay | Admitting: Acute Care

## 2018-04-14 DIAGNOSIS — Z930 Tracheostomy status: Secondary | ICD-10-CM

## 2018-04-14 MED ORDER — FLUTICASONE PROPIONATE 50 MCG/ACT NA SUSP: 2.0000 | Freq: Every day | NASAL | 6 refills | Status: DC

## 2018-04-14 NOTE — Progress Notes (Signed)
[name]'s husband marked called. He was asking about humidification (which I ordered last week) and reporting that [name] was still needing frequent suctioning.   I called Dorian, told her about the order placed for trach collar.  Plan: humidified trach collar during the night Also ordered HME for the trach. Plan is to wear this in place of PMV during the day.   I instructed her about this plan and will check in towards the end of the week to ensure she's doing well. Also encouraged her to reach out to the DME company to get her humidifier.   Erick Colace ACNP-BC [place] Pager # [phone] OR # [phone] if no answer

## 2018-04-15 ENCOUNTER — Other Ambulatory Visit: Payer: Self-pay

## 2018-04-15 ENCOUNTER — Encounter (HOSPITAL_COMMUNITY): Payer: Self-pay

## 2018-04-15 ENCOUNTER — Emergency Department (HOSPITAL_COMMUNITY)
Admission: EM | Admit: 2018-04-15 | Discharge: 2018-04-15 | Disposition: A | Discharge status: Home / Self Care | Payer: Medicaid Other | Attending: Emergency Medicine | Admitting: Emergency Medicine

## 2018-04-15 DIAGNOSIS — F17210 Nicotine dependence, cigarettes, uncomplicated: Secondary | ICD-10-CM | POA: Diagnosis not present

## 2018-04-15 DIAGNOSIS — E119 Type 2 diabetes mellitus without complications: Secondary | ICD-10-CM | POA: Insufficient documentation

## 2018-04-15 DIAGNOSIS — R0602 Shortness of breath: Secondary | ICD-10-CM | POA: Diagnosis present

## 2018-04-15 DIAGNOSIS — Z430 Encounter for attention to tracheostomy: Secondary | ICD-10-CM | POA: Diagnosis not present

## 2018-04-15 DIAGNOSIS — Z794 Long term (current) use of insulin: Secondary | ICD-10-CM | POA: Diagnosis not present

## 2018-04-15 DIAGNOSIS — E039 Hypothyroidism, unspecified: Secondary | ICD-10-CM | POA: Diagnosis not present

## 2018-04-15 DIAGNOSIS — Z79899 Other long term (current) drug therapy: Secondary | ICD-10-CM | POA: Insufficient documentation

## 2018-04-15 DIAGNOSIS — I10 Essential (primary) hypertension: Secondary | ICD-10-CM | POA: Diagnosis not present

## 2018-04-15 DIAGNOSIS — Z96651 Presence of right artificial knee joint: Secondary | ICD-10-CM | POA: Insufficient documentation

## 2018-04-15 NOTE — Progress Notes (Signed)
Rt suction thick hard green plug out of pt. Vitals normal no distress noted at this time. Pt able to breath and speak with no complications.

## 2018-04-15 NOTE — ED Provider Notes (Signed)
[facility] DEPT Provider Note   CSN: [id number] Arrival date & time: 04/15/18  1605     History   Chief Complaint Chief Complaint  Patient presents with  . Breathing Problem    HPI [name] is a 42 y.o. female.  Patient been out of rehab approximately about 3 months.  New tracheostomy.  Sounds as if patient is not having significant respiratory or home intervention with the tracheostomy.  It keeps clogging.  May require humidification to help prevent that.  Apparently respiratory that saw her the last time is working on that.  Patient has been seen beginning of June and then the end of July for the same problem.  Patient feels as if the tube is clogged again.  Oxygen sats are fine no respiratory distress.     Past Medical History:  Diagnosis Date  . Anxiety   . Complication of anesthesia    Pt. states takes long time to wake up from it.   . Cushing's disease ([place])   . Depression   . Diabetes ([place])   . Hyperlipidemia   . Hyperlipidemia   . Hypertension   . Morbid obesity ([place])   . Osteoporosis 07/19/2015  . Periprosthetic fracture 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[place]), R tibial plateau  07/18/2015  . Sleep apnea   . Uncontrolled diabetes mellitus with diabetic neuropathy, with long-term current use of insulin ([place]) 07/16/2015  . Vitamin D deficiency 07/19/2015    Patient Active Problem List   Diagnosis Date Noted  . Tracheostomy dependence ([place])   . Obstructive sleep apnea syndrome   . MDD (major depressive disorder), recurrent episode, moderate ([place])   . Acute respiratory failure with hypoxemia ([place])   . SOB (shortness of breath)   . Tracheostomy status ([place])   . Hoarse voice quality   . Hypomagnesemia   . Shortness of breath   . Major depressive disorder, recurrent episode, severe ([place])   . Intentional overdose of drug in tablet form ([place]) 11/20/2017  . Intentional drug overdose ([place])   . Seizure ([place])   . Acute respiratory  failure with hypercapnia ([place])   . Status epilepticus ([place])   . Spondylosis without myelopathy or radiculopathy, cervical region 10/29/2017  . Abdominal pain, epigastric   . LUQ abdominal pain   . Nausea and vomiting   . Gastroparesis   . Hypertriglyceridemia 07/22/2015  . Diabetes mellitus due to underlying condition, uncontrolled, with diabetic neuropathy, with long-term current use of insulin ([place])   . Vitamin D deficiency 07/19/2015  . 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 Osteoporosis 07/19/2015  . Periprosthetic fracture 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[place]), R tibial plateau  07/18/2015  . Fracture, tibial plateau 07/16/2015  . Uncontrolled diabetes mellitus with diabetic neuropathy, with long-term current use of insulin ([name]) 07/16/2015  . Leukocytosis 07/16/2015  . Essential hypertension 07/16/2015  . Morbid obesity due to excess calories ([place]) 04/20/2014  . Hyperlipidemia 07/31/2012  . Cushing disease ([place]) 07/31/2012  . Hypothyroid 07/31/2012    Past Surgical History:  Procedure Laterality Date  . ANTERIOR TALOFIBULAR LIGAMENT REPAIR Left 11/15/2014   Procedure: ANTERIOR TALOFIBULAR LIGAMENT REPAIR;  Surgeon: Jana Half, DPM;  Location: [facility];  Service: Podiatry;  Laterality: Left;  . CESAREAN SECTION  dec 1997/  06-03-2001/   01-01-2005   BILATERAL TUBAL LIGATION WITH LAST ONE  . DILATION AND CURETTAGE OF UTERUS  1995   WITH SUCTION  . DIRECT  LARYNGOSCOPY N/A 12/18/2017   Procedure: DIRECT LARYNGOSCOPY;  Surgeon: Izora Gala, MD;  Location: [facility];  Service: ENT;  Laterality: N/A;  . ESOPHAGOGASTRODUODENOSCOPY (EGD) WITH PROPOFOL N/A 10/04/2016   Procedure: ESOPHAGOGASTRODUODENOSCOPY (EGD) WITH PROPOFOL;  Surgeon: [name], MD;  [facility];  Service: Endoscopy;  Laterality: N/A;  . LAPAROSCOPIC CHOLECYSTECTOMY  09-25-2005  . ORIF TIBIA PLATEAU Right 07/19/2015   Procedure: OPEN  REDUCTION INTERNAL FIXATION (ORIF) RIGHT TIBIAL PLATEAU;  Surgeon: Altamese Stanhope, MD;  Location: [facility];  Service: Orthopedics;  Laterality: Right;  . PARTIAL KNEE ARTHROPLASTY Right 04/19/2014   Procedure: RIGHT UNI KNEE ARTHROPLASTY MEDIALLY ;  Surgeon: Mauri Pole, MD;  [facility];  Service: Orthopedics;  Laterality: Right;  . TRACHEOSTOMY TUBE PLACEMENT N/A 11/28/2017   Procedure: TRACHEOSTOMY;  Surgeon: Izora Gala, MD;  Location: [facility];  Service: ENT;  Laterality: N/A;  . TUBAL LIGATION       OB History   None      Home Medications    Prior to Admission medications   Medication Sig Start Date End Date Taking? Authorizing Provider  atorvastatin (LIPITOR) 20 MG tablet Take 20 mg by mouth at bedtime.   Yes [provider]  buPROPion (WELLBUTRIN SR) 100 MG 12 hr tablet Take 100 mg by mouth 2 (two) times daily.   Yes [provider]  cholestyramine (QUESTRAN) 4 g packet Take 1 packet (4 g total) by mouth every other day. 01/08/18  Yes Nita Sells, MD  insulin NPH-regular Human (NOVOLIN 70/30) (70-30) 100 UNIT/ML injection Inject 28 Units into the skin 2 (two) times daily with a meal. Patient taking differently: Inject 50 Units into the skin at bedtime.  07/21/15  Yes Delfina Redwood, MD  levothyroxine (SYNTHROID, LEVOTHROID) 75 MCG tablet Take 1 tablet (75 mcg total) by mouth daily before breakfast. 01/07/18  Yes Nita Sells, MD  metFORMIN (GLUCOPHAGE) 1000 MG tablet Take 1,000 mg by mouth 2 (two) times daily with a meal.   Yes [provider]  metoprolol tartrate (LOPRESSOR) 25 MG tablet Take 0.5 tablets (12.5 mg total) by mouth 2 (two) times daily. 01/06/18  Yes Nita Sells, MD  naproxen (NAPROSYN) 500 MG tablet Take 1 tablet (500 mg total) by mouth 2 (two) times daily. 10/31/16  Yes Tanna Furry, MD  norethindrone (MICRONOR,CAMILA,ERRIN) 0.35 MG tablet Take 1 tablet by mouth daily.   Yes [provider]  pantoprazole  (PROTONIX) 40 MG tablet TAKE ONE TABLET BY MOUTH TWICE A DAY BEFORE MEALS 04/01/17  Yes [name], MD  pioglitazone (ACTOS) 15 MG tablet Take 15 mg by mouth daily.   Yes [provider]  quinapril (ACCUPRIL) 5 MG tablet Take 2.5 mg by mouth every morning.    Yes [provider]  traZODone (DESYREL) 50 MG tablet Take 50 mg by mouth at bedtime.   Yes [provider]  acetaminophen (TYLENOL) 325 MG tablet Take 2 tablets (650 mg total) by mouth every 6 (six) hours as needed for mild pain or headache. Patient not taking: Reported on 04/15/2018 01/06/18   Nita Sells, MD  albuterol (PROVENTIL HFA;VENTOLIN HFA) 108 (90 Base) MCG/ACT inhaler Inhale 2 puffs into the lungs every 6 (six) hours as needed for wheezing or shortness of breath. Patient not taking: Reported on 04/15/2018 01/07/18   Nita Sells, MD  fluticasone [facility]) 50 MCG/ACT nasal spray Place 2 sprays into both nostrils daily. Patient not taking: Reported on 04/15/2018 04/14/18 04/14/19  Erick Colace, NP  sucralfate (CARAFATE) 1 g tablet Take 1 tablet (1 g total) by mouth 4 (four) times daily -  with meals and at bedtime. Patient not taking: Reported on 04/15/2018 08/25/16   Theodosia Quay, MD    Family History Family History  Adopted: Yes  Problem Relation Age of Onset  . Autism Son   . ADD / ADHD Son   . Apraxia Son     Social History Social History   Tobacco Use  . Smoking status: Current Every Day Smoker    Years: 4.00    Types: Cigarettes  . Smokeless tobacco: Never Used  Substance Use Topics  . Alcohol use: Yes    Comment: RARE  . Drug use: No    Types: Marijuana     Allergies   Penicillins   Review of Systems Review of Systems  Constitutional: Negative for diaphoresis and fever.  HENT: Negative for congestion.   Eyes: Negative for visual disturbance.  Respiratory: Positive for shortness of breath.   Cardiovascular: Negative for chest pain.  Gastrointestinal:  Negative for abdominal pain.  Musculoskeletal: Negative for neck pain.  Neurological: Negative for syncope.  Hematological: Does not bruise/bleed easily.  Psychiatric/Behavioral: Negative for confusion.     Physical Exam Updated Vital Signs BP 110/64 (BP Location: Left Arm)   Pulse (!) 108   Temp 98.4 F (36.9 C) (Oral)   Resp 16   Ht 1.575 m (5\' 2" )   Wt 133.4 kg (294 lb)   LMP 12/14/2017   SpO2 92%   BMI 53.77 kg/m   Physical Exam  Constitutional: She is oriented to person, place, and time. She appears well-developed and well-nourished. No distress.  HENT:  Head: Normocephalic and atraumatic.  Mouth/Throat: Oropharynx is clear and moist.  Eyes: Pupils are equal, round, and reactive to light. EOM are normal.  Neck: Neck supple.  Cardiovascular: Normal rate and regular rhythm.  Pulmonary/Chest: Effort normal and breath sounds normal. No stridor. No respiratory distress. She has no wheezes.  Abdominal: Soft. Bowel sounds are normal. There is no tenderness.  Musculoskeletal: Normal range of motion.  Neurological: She is alert and oriented to person, place, and time. No cranial nerve deficit or sensory deficit. She exhibits normal muscle tone. Coordination normal.  Skin: Skin is warm.  Nursing note and vitals reviewed.    ED Treatments / Results  Labs (all labs ordered are listed, but only abnormal results are displayed) Labs Reviewed - No data to display  EKG None  Radiology No results found.  Procedures Procedures (including critical care time)  Medications Ordered in ED Medications - No data to display   Initial Impression / Assessment and Plan / ED Course  I have reviewed the triage vital signs and the nursing notes.  Pertinent labs & imaging results that were available during my care of the patient were reviewed by me and considered in my medical decision making (see chart for details).    Patient seen by respiratory therapy mucous plug cleared.  Based  on their recommendations patient will require some humidified air for the trach at home.  And apparently is being arranged.  Asked patient to follow back up with primary care provider for them to check back with respiratory.  Patient has been seen several times for trach care here in the emergency department.  Home care would be appropriate.  Patient feeling much better after having the trach tube cleared out.  No further concerns.  Final Clinical Impressions(s) / ED Diagnoses   Final  diagnoses:  Tracheostomy care [facility])    ED Discharge Orders    None       Fredia Sorrow, MD 04/15/18 [phone]

## 2018-04-15 NOTE — ED Notes (Signed)
Respiratory reports suctioning a large green hard mucus plug from Trach. Pt states she is dramatically better

## 2018-04-15 NOTE — Discharge Instructions (Addendum)
Follow-up with your doctor.  Sounds as if respiratory trying to get some care out of the house for your doctor may help coordinate this.  Does sound as if he would benefit for some tracheostomy care at home every 2 weeks.  Return for any new or worse symptoms.

## 2018-04-15 NOTE — ED Triage Notes (Addendum)
Pt has a trach. They state that it is more clogged than normal. Pt is stating that Marni Griffon would come and take care of it if he's here. Pt speaking in full sentence.s

## 2018-04-30 ENCOUNTER — Inpatient Hospital Stay (HOSPITAL_COMMUNITY): Admission: RE | Admit: 2018-04-30 | Payer: Self-pay | Source: Ambulatory Visit

## 2018-05-02 ENCOUNTER — Encounter (HOSPITAL_COMMUNITY): Payer: Self-pay | Admitting: Emergency Medicine

## 2018-05-02 ENCOUNTER — Emergency Department (HOSPITAL_COMMUNITY): Payer: Medicaid Other

## 2018-05-02 ENCOUNTER — Emergency Department (HOSPITAL_COMMUNITY)
Admission: EM | Admit: 2018-05-02 | Discharge: 2018-05-02 | Disposition: A | Discharge status: Home / Self Care | Payer: Medicaid Other | Attending: Emergency Medicine | Admitting: Emergency Medicine

## 2018-05-02 ENCOUNTER — Other Ambulatory Visit: Payer: Self-pay

## 2018-05-02 DIAGNOSIS — Z794 Long term (current) use of insulin: Secondary | ICD-10-CM | POA: Insufficient documentation

## 2018-05-02 DIAGNOSIS — R0902 Hypoxemia: Secondary | ICD-10-CM

## 2018-05-02 DIAGNOSIS — F17210 Nicotine dependence, cigarettes, uncomplicated: Secondary | ICD-10-CM | POA: Insufficient documentation

## 2018-05-02 DIAGNOSIS — J9500 Unspecified tracheostomy complication: Secondary | ICD-10-CM | POA: Diagnosis not present

## 2018-05-02 DIAGNOSIS — E1140 Type 2 diabetes mellitus with diabetic neuropathy, unspecified: Secondary | ICD-10-CM | POA: Insufficient documentation

## 2018-05-02 DIAGNOSIS — J9621 Acute and chronic respiratory failure with hypoxia: Secondary | ICD-10-CM

## 2018-05-02 DIAGNOSIS — I10 Essential (primary) hypertension: Secondary | ICD-10-CM | POA: Insufficient documentation

## 2018-05-02 DIAGNOSIS — Z930 Tracheostomy status: Secondary | ICD-10-CM

## 2018-05-02 DIAGNOSIS — R059 Cough, unspecified: Secondary | ICD-10-CM

## 2018-05-02 DIAGNOSIS — R05 Cough: Secondary | ICD-10-CM

## 2018-05-02 DIAGNOSIS — G4733 Obstructive sleep apnea (adult) (pediatric): Secondary | ICD-10-CM

## 2018-05-02 MED ORDER — OXYCODONE HCL 5 MG PO TABS
5.0000 mg | ORAL_TABLET | Freq: Once | ORAL | Status: AC
  Administered 2018-05-02: 5 mg via ORAL
  Filled 2018-05-02: qty 1

## 2018-05-02 NOTE — Discharge Instructions (Addendum)
You were evaluated in the Emergency Department and after careful evaluation, we did not find any emergent condition requiring admission or further testing in the hospital.  Your symptoms today seem to be due to a dislodged tracheostomy tube, which we replaced today.  Please return to the Emergency Department if you experience any worsening of your condition.  We encourage you to follow up with a primary care provider.  Thank you for allowing [place] to be a part of your care.

## 2018-05-02 NOTE — ED Notes (Signed)
Patient given discharge instructions and verbalized understanding.  Patient stable to discharge at this time.  Patient is alert and oriented to baseline.  No distressed noted at this time.  All belongings taken with the patient at discharge.   

## 2018-05-02 NOTE — Procedures (Signed)
Patient was positioned in bed, guide tubing was placed into the airway through the stoma, stoma was dilated and wire kept in placed.  Size 6 proximal cuffless XLT trach was placed without difficulty with good color change and bilateral BS audible.  Patient tolerated the procedure well without complications.  Rush Farmer, M.D. [facility] Pulmonary/Critical Care Medicine. Pager: [phone]. After hours pager: [phone].

## 2018-05-02 NOTE — ED Triage Notes (Signed)
Per EMS Pt from home complains of coughing out trache collar.  Been 97% on RA NAD at this time

## 2018-05-02 NOTE — ED Provider Notes (Signed)
[facility] Emergency Department Provider Note MRN:  [id number]  Arrival date & time: 05/02/18     Chief Complaint   Airway Obstruction   History of Present Illness   [name] is a 42 y.o. year-old female with a history of diabetes, chronic respiratory failure presenting to the ED with chief complaint of displaced tracheostomy tube.  Patient was coughing this morning and accidentally coughed up her entire tracheostomy tube, prompting ED visit.  Possibly some increased cough and sputum production over the course of the past few days.  Denies fever, no shortness of breath, no chest pain.  Current symptoms are mild to moderate in severity, constant, no exacerbating or relieving factors.  Review of Systems  A complete 10 system review of systems was obtained and all systems are negative except as noted in the HPI and PMH.   Patient's Health History    Past Medical History:  Diagnosis Date  . Anxiety   . Complication of anesthesia    Pt. states takes long time to wake up from it.   . Cushing's disease ([place])   . Depression   . Diabetes ([place])   . Hyperlipidemia   . Hyperlipidemia   . Hypertension   . Morbid obesity ([place])   . Osteoporosis 07/19/2015  . 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 Sleep apnea   . Uncontrolled diabetes mellitus with diabetic neuropathy, with long-term current use of insulin ([place]) 07/16/2015  . Vitamin D deficiency 07/19/2015    Past Surgical History:  Procedure Laterality Date  . ANTERIOR TALOFIBULAR LIGAMENT REPAIR Left 11/15/2014   Procedure: ANTERIOR TALOFIBULAR LIGAMENT REPAIR;  Surgeon: Jana Half, DPM;  Location: [place];  Service: Podiatry;  Laterality: Left;  . CESAREAN SECTION  dec 1997/  06-03-2001/   01-01-2005   BILATERAL TUBAL LIGATION WITH LAST ONE  . DILATION AND CURETTAGE OF UTERUS  1995   WITH SUCTION  . DIRECT LARYNGOSCOPY N/A 12/18/2017     Procedure: DIRECT LARYNGOSCOPY;  Surgeon: Izora Gala, MD;  Location: [place];  Service: ENT;  Laterality: N/A;  . ESOPHAGOGASTRODUODENOSCOPY (EGD) WITH PROPOFOL N/A 10/04/2016   Procedure: ESOPHAGOGASTRODUODENOSCOPY (EGD) WITH PROPOFOL;  Surgeon: [name], MD;  Location: [facility];  Service: Endoscopy;  Laterality: N/A;  . LAPAROSCOPIC CHOLECYSTECTOMY  09-25-2005  . ORIF TIBIA PLATEAU Right 07/19/2015   Procedure: OPEN REDUCTION INTERNAL FIXATION (ORIF) RIGHT TIBIAL PLATEAU;  Surgeon: Altamese Houghton, MD;  Location: [place];  Service: Orthopedics;  Laterality: Right;  . PARTIAL KNEE ARTHROPLASTY Right 04/19/2014   Procedure: RIGHT UNI KNEE ARTHROPLASTY MEDIALLY ;  Surgeon: Mauri Pole, MD;  Location: [facility];  Service: Orthopedics;  Laterality: Right;  . TRACHEOSTOMY TUBE PLACEMENT N/A 11/28/2017   Procedure: TRACHEOSTOMY;  Surgeon: Izora Gala, MD;  Location: [place];  Service: ENT;  Laterality: N/A;  . TUBAL LIGATION      Family History  Adopted: Yes  Problem Relation Age of Onset  . Autism Son   . ADD / ADHD Son   . Apraxia Son     Social History   Socioeconomic History  . Marital status: Married    Spouse name: [name]  . Number of children: 2  . Years of education: Not on file  . Highest education level: Not on file  Occupational History  . Not on file  Social Needs  . Financial resource strain: Not on file  . Food insecurity:  Worry: Not on file    Inability: Not on file  . Transportation needs:    Medical: Not on file    Non-medical: Not on file  Tobacco Use  . Smoking status: Current Every Day Smoker    Years: 4.00    Types: Cigarettes  . Smokeless tobacco: Never Used  Substance and Sexual Activity  . Alcohol use: Yes    Comment: RARE  . Drug use: No    Types: Marijuana  . Sexual activity: Yes    Partners: Male    Birth control/protection: Pill  Lifestyle  . Physical activity:    Days per week: Patient refused    Minutes per session: Patient refused   . Stress: Not on file  Relationships  . Social connections:    Talks on phone: Not on file    Gets together: Not on file    Attends religious service: Not on file    Active member of club or organization: Not on file    Attends meetings of clubs or organizations: Not on file    Relationship status: Not on file  . Intimate partner violence:    Fear of current or ex partner: Not on file    Emotionally abused: Not on file    Physically abused: Not on file    Forced sexual activity: Not on file  Other Topics Concern  . Not on file  Social History Narrative  . Not on file     Physical Exam  Vital Signs and Nursing Notes reviewed Vitals:   05/02/18 1201 05/02/18 1343  BP:  111/75  Pulse: 99 97  Resp: (!) 22 18  SpO2: 98% 98%    CONSTITUTIONAL: Chronically ill-appearing, NAD NEURO:  Alert and oriented x 3, no focal deficits EYES:  eyes equal and reactive ENT/NECK:  no LAD, no JVD, size 6 Shiley tube in place CARDIO: Regular rate, well-perfused, normal S1 and S2 PULM:  CTAB no wheezing or rhonchi GI/GU:  normal bowel sounds, non-distended, non-tender MSK/SPINE:  No gross deformities, no edema SKIN:  no rash, atraumatic PSYCH:  Appropriate speech and behavior  Diagnostic and Interventional Summary    EKG Interpretation  Date/Time:    Ventricular Rate:    PR Interval:    QRS Duration:   QT Interval:    QTC Calculation:   R Axis:     Text Interpretation:        Labs Reviewed - No data to display  DG Chest Port 1 View  Final Result      Medications  oxyCODONE (Oxy IR/ROXICODONE) immediate release tablet 5 mg (5 mg Oral Given 05/02/18 1209)     Procedures Critical Care  ED Course and Medical Decision Making  I have reviewed the triage vital signs and the nursing notes.  Pertinent labs & imaging results that were available during my care of the patient were reviewed by me and considered in my medical decision making (see below for details). Clinical Course as  of May 03 1639  Fri May 02, 2018  1203 Dislodged tracheostomy tube successfully replaced by critical care physician during patient's time in the ED.  Will screen with chest x-ray given the question of increased sputum production.  Will observe in the ED, provide p.o. pain medication given the recent dilation required to replace tube.   [MB]    Clinical Course User Index [MB] Maudie Flakes, MD    Chest x-ray unremarkable, patient feeling well and requesting discharge.  We will follow-up  with her regular doctors.  After the discussed management above, the patient was determined to be safe for discharge.  The patient was in agreement with this plan and all questions regarding their care were answered.  ED return precautions were discussed and the patient will return to the ED with any significant worsening of condition.  Barth Kirks. Sed[place] Small, MD [place] [facility] [email or web address]  Final Clinical Impressions(s) / ED Diagnoses     ICD-10-CM   1. Tracheostomy complication, unspecified complication type ([place]) P92.92   2. Cough R05 DG Chest [facility] 1 View    DG Chest [facility]    ED Discharge Orders    None         Maudie Flakes, MD 05/02/18 1640

## 2018-05-02 NOTE — Consult Note (Signed)
Name: [name] MRN: [id number] DOB: 31-Oct-1975    ADMISSION DATE:  05/02/2018 CONSULTATION DATE:  05/02/2018  REFERRING MD :  Sedonia Small EDP  CHIEF COMPLAINT:  Chronic respiratory failure and trach dislodgement  BRIEF PATIENT DESCRIPTION: 42 year old female with extensive PMH to include morbid obesity and chronic respiratory failure that presents to the ED after her trach was dislodged.  RT was unable to place 6 XLT trach back and placed a 4 shiley but it becoming obstructed due to patient's girth.  PCCM was consulted for placement of trach.  SIGNIFICANT EVENTS    STUDIES:     HISTORY OF PRESENT ILLNESS:  42 year old female with extensive PMH to include morbid obesity and chronic respiratory failure that presents to the ED after her trach was dislodged.  RT was unable to place 6 XLT trach back and placed a 4 shiley but it becoming obstructed due to patient's girth.  PCCM was consulted for placement of trach.  PAST MEDICAL HISTORY :   has a past medical history of Anxiety, Complication of anesthesia, Cushing's disease ([place]), Depression, Diabetes ([place]), Hyperlipidemia, Hyperlipidemia, Hypertension, Morbid obesity ([place]), Osteoporosis (07/19/2015), Periprosthetic fracture 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[place]), R tibial plateau  (07/18/2015), Sleep apnea, Uncontrolled diabetes mellitus with diabetic neuropathy, with long-term current use of insulin ([place]) (07/16/2015), and Vitamin D deficiency (07/19/2015).  has a past surgical history that includes Cesarean section (dec 1997/  06-03-2001/   01-01-2005); Partial knee arthroplasty (Right, 04/19/2014); Laparoscopic cholecystectomy (09-25-2005); Dilation and curettage of uterus (1995); Anterior talofibular ligament repair (Left, 11/15/2014); ORIF tibia plateau (Right, 07/19/2015); Tubal ligation; Esophagogastroduodenoscopy (egd) with propofol (N/A, 10/04/2016); Tracheostomy tube placement (N/A, 11/28/2017); and Direct laryngoscopy (N/A, 12/18/2017). Prior to  Admission medications   Medication Sig Start Date End Date Taking? Authorizing Provider  albuterol (PROVENTIL HFA;VENTOLIN HFA) 108 (90 Base) MCG/ACT inhaler Inhale 2 puffs into the lungs every 6 (six) hours as needed for wheezing or shortness of breath. 01/07/18  Yes Nita Sells, MD  atorvastatin (LIPITOR) 20 MG tablet Take 20 mg by mouth at bedtime.   Yes [provider]  buPROPion (WELLBUTRIN SR) 100 MG 12 hr tablet Take 100 mg by mouth 2 (two) times daily.   Yes [provider]  cholestyramine (QUESTRAN) 4 g packet Take 1 packet (4 g total) by mouth every other day. 01/08/18  Yes Nita Sells, MD  fluticasone (FLONASE) 50 MCG/ACT nasal spray Place 2 sprays into both nostrils daily. 04/14/18 04/14/19 Yes Erick Colace, NP  insulin NPH-regular Human (NOVOLIN 70/30) (70-30) 100 UNIT/ML injection Inject 28 Units into the skin 2 (two) times daily with a meal. Patient taking differently: Inject 50 Units into the skin 2 (two) times daily.  07/21/15  Yes Delfina Redwood, MD  levothyroxine (SYNTHROID, LEVOTHROID) 75 MCG tablet Take 1 tablet (75 mcg total) by mouth daily before breakfast. 01/07/18  Yes Nita Sells, MD  metFORMIN (GLUCOPHAGE) 1000 MG tablet Take 1,000 mg by mouth 2 (two) times daily with a meal.   Yes [provider]  metoprolol tartrate (LOPRESSOR) 25 MG tablet Take 0.5 tablets (12.5 mg total) by mouth 2 (two) times daily. 01/06/18  Yes Nita Sells, MD  naproxen (NAPROSYN) 500 MG tablet Take 1 tablet (500 mg total) by mouth 2 (two) times daily. 10/31/16  Yes Tanna Furry, MD  norethindrone (MICRONOR,CAMILA,ERRIN) 0.35 MG tablet Take 1 tablet by mouth daily.   Yes [provider]  pantoprazole (PROTONIX) 40 MG tablet TAKE ONE TABLET BY MOUTH  TWICE A DAY BEFORE MEALS Patient taking differently: Take 40 mg by mouth daily.  04/01/17  Yes [name], MD  pioglitazone (ACTOS) 15 MG tablet Take 15 mg by mouth daily.   Yes  [provider]  quinapril (ACCUPRIL) 5 MG tablet Take 2.5 mg by mouth every morning.    Yes [provider]  traZODone (DESYREL) 50 MG tablet Take 50 mg by mouth at bedtime.   Yes [provider]  acetaminophen (TYLENOL) 325 MG tablet Take 2 tablets (650 mg total) by mouth every 6 (six) hours as needed for mild pain or headache. Patient not taking: Reported on 04/15/2018 01/06/18   Nita Sells, MD  sucralfate (CARAFATE) 1 g tablet Take 1 tablet (1 g total) by mouth 4 (four) times daily -  with meals and at bedtime. Patient not taking: Reported on 04/15/2018 08/25/16   Theodosia Quay, MD   Allergies  Allergen Reactions  . Penicillins Hives and Other (See Comments)    PATIENT HAS HAD A PCN REACTION WITH IMMEDIATE RASH, FACIAL/TONGUE/THROAT SWELLING, SOB, OR LIGHTHEADEDNESS WITH HYPOTENSION:   YES ** see below HAS PT DEVELOPED SEVERE RASH INVOLVING MUCUS MEMBRANES or SKIN NECROSIS: YES ** see below Has patient had a PCN reaction that required hospitalization No Has patient had a PCN reaction occurring within the last 10 years: No  **TOLERATED ZOSYN 12/12/17    FAMILY HISTORY:  family history includes ADD / ADHD in her son; Apraxia in her son; Autism in her son. She was adopted. SOCIAL HISTORY:  reports that she has been smoking cigarettes. She has smoked for the past 4.00 years. She has never used smokeless tobacco. She reports that she drinks alcohol. She reports that she does not use drugs.  REVIEW OF SYSTEMS:   Constitutional: Negative for fever, chills, weight loss, malaise/fatigue and diaphoresis.  HENT: Negative for hearing loss, ear pain, nosebleeds, congestion, sore throat, neck pain, tinnitus and ear discharge.   Eyes: Negative for blurred vision, double vision, photophobia, pain, discharge and redness.  Respiratory: Negative for cough, hemoptysis, sputum production, shortness of breath, wheezing and stridor.   Cardiovascular: Negative for chest pain,  palpitations, orthopnea, claudication, leg swelling and PND.  Gastrointestinal: Negative for heartburn, nausea, vomiting, abdominal pain, diarrhea, constipation, blood in stool and melena.  Genitourinary: Negative for dysuria, urgency, frequency, hematuria and flank pain.  Musculoskeletal: Negative for myalgias, back pain, joint pain and falls.  Skin: Negative for itching and rash.  Neurological: Negative for dizziness, tingling, tremors, sensory change, speech change, focal weakness, seizures, loss of consciousness, weakness and headaches.  Endo/Heme/Allergies: Negative for environmental allergies and polydipsia. Does not bruise/bleed easily.  SUBJECTIVE:   VITAL SIGNS: Pulse Rate:  [97-99] 97 (08/23 1343) Resp:  [18-22] 18 (08/23 1343) BP: (111)/(75) 111/75 (08/23 1343) SpO2:  [98 %] 98 % (08/23 1343) FiO2 (%):  [28 %] 28 % (08/23 1201)  PHYSICAL EXAMINATION: General:  Chronically ill appearing morbidly obese female, in respiratory distress due to inability to breath through the trach Neuro:  Alert and interactive, moving all ext to command HEENT:  [place]/AT, PERRL, EOM-I and MMM, size 4 shiley in place without significant air movement through the trach site Cardiovascular:  Sinus tach, Nl S1/S2 and -M/R/G Lungs:  Decreased and distant BS diffusely Abdomen:  Soft, NT, ND and +BS, very obese Musculoskeletal:  -edema and -tenderness Skin:  Intact  No results for input(s): NA, K, CL, CO2, BUN, CREATININE, GLUCOSE in the last 168 hours. No results for input(s): HGB,  HCT, WBC, PLT in the last 168 hours. Dg Chest Port 1 View  Result Date: 05/02/2018 CLINICAL DATA:  Cough. EXAM: PORTABLE CHEST 1 VIEW COMPARISON:  12/22/2017. FINDINGS: Tracheostomy tube noted in good anatomic position. Cardiomegaly with normal pulmonary vascularity. Low lung volumes with mild bibasilar. Chest is stable from prior exam. IMPRESSION: 1.  Tracheostomy tube noted stable position. 2. Low lung volumes with mild  bibasilar. Chest is stable from prior exam. Electronically Signed   By: Marcello Moores  Register   On: 05/02/2018 12:18   I reviewed CXR myself, prior CXR, trach was in a good position at that point  ASSESSMENT / PLAN:  42 year old morbidly obese female in moderate respiratory distress after dislodging her tracheostomy and current trach is ineffectively placed due to the patient sheer size.  Stoma was dilated and a size 6 XLT was placed.  Discussed with PCCM-NP.  Chronic respiratory failure:  - No need for vent at this point  - Replace trach with a size 6 XLT given girth  OSA:  - Maintain trach patent at night, no need for vent at this point  Hypoxemia:  - Change to [place] until trach is replaced then place back on TC  Trach status:  - Dilate and reinsert trach size 6 cuffless XLT  PCCM will sign off, please call back if needed.  [place] Farmer, M.D. [facility] Pulmonary/Critical Care Medicine. Pager: [phone]. After hours pager: [phone].  05/02/2018, 1:58 PM

## 2018-05-02 NOTE — Progress Notes (Signed)
Patient arrived via EMS with trach out. Patient had in a #6 CFS XLT proximal but RT was unable to get the same size back in due to stoma starting to close. This RT was able to get in a #4 Shiley CFS. Lurline Idol was secured with trach ties & ICU physician was immediately called to place the proper size trach. ICU physician arrived & placed a #6 XLT proximal CFS without complications. Lurline Idol was secured with trach ties ETCO2 was positive. Patient was placed on a 28% ATC. Patient is not in any distress at this time. RN aware that patient needs a #6 CFS Shiley XLT proximal trach before she is discharged home.

## 2018-05-09 ENCOUNTER — Encounter (HOSPITAL_COMMUNITY): Payer: Self-pay | Admitting: Emergency Medicine

## 2018-05-09 ENCOUNTER — Other Ambulatory Visit: Payer: Self-pay

## 2018-05-09 ENCOUNTER — Emergency Department (HOSPITAL_COMMUNITY)
Admission: EM | Admit: 2018-05-09 | Discharge: 2018-05-09 | Disposition: A | Discharge status: Home / Self Care | Payer: Medicaid Other | Attending: Emergency Medicine | Admitting: Emergency Medicine

## 2018-05-09 DIAGNOSIS — E039 Hypothyroidism, unspecified: Secondary | ICD-10-CM | POA: Insufficient documentation

## 2018-05-09 DIAGNOSIS — E119 Type 2 diabetes mellitus without complications: Secondary | ICD-10-CM | POA: Insufficient documentation

## 2018-05-09 DIAGNOSIS — J9509 Other tracheostomy complication: Secondary | ICD-10-CM | POA: Diagnosis not present

## 2018-05-09 DIAGNOSIS — I10 Essential (primary) hypertension: Secondary | ICD-10-CM | POA: Diagnosis not present

## 2018-05-09 DIAGNOSIS — Z430 Encounter for attention to tracheostomy: Secondary | ICD-10-CM | POA: Diagnosis present

## 2018-05-09 DIAGNOSIS — J9500 Unspecified tracheostomy complication: Secondary | ICD-10-CM

## 2018-05-09 DIAGNOSIS — Z794 Long term (current) use of insulin: Secondary | ICD-10-CM | POA: Diagnosis not present

## 2018-05-09 DIAGNOSIS — F17210 Nicotine dependence, cigarettes, uncomplicated: Secondary | ICD-10-CM | POA: Diagnosis not present

## 2018-05-09 NOTE — ED Triage Notes (Signed)
Pt has tracheostomy placed 6 months ago. Pt reports difficulty suctioning herself, reports the tube meets resistance. Pt is not in acute distress at this time. VSS.

## 2018-05-09 NOTE — ED Notes (Signed)
Pt called in the waiting area to reassess vitals with no answer.

## 2018-05-09 NOTE — ED Notes (Signed)
Patient given discharge instructions and verbalized understanding.  Patient stable to discharge at this time.  Patient is alert and oriented to baseline.  No distressed noted at this time.  All belongings taken with the patient at discharge.   

## 2018-05-09 NOTE — Progress Notes (Signed)
Patient was complaining of a mucous plug. The patient was suctioned for two large mucous plugs. The patient states that she is breathing much better now. Placement was checked with a CO2 monitor

## 2018-05-10 NOTE — ED Provider Notes (Signed)
[facility] EMERGENCY DEPARTMENT Provider Note   CSN: [id number] Arrival date & time: 05/09/18  1819     History   Chief Complaint Chief Complaint  Patient presents with  . Shortness of Breath    tracheostomy blockage    HPI [name] is a 42 y.o. female.  HPI Patient presents the emergency department with difficulty suctioning herself through tracheostomy tube tonight and feels like this may need to be flushed.  Her tracheostomy was flushed by respiratory therapy prior to my arrival and at this time is without any complaints.  Patient reports feet she feels much better and reports this relieved the obstruction.   Past Medical History:  Diagnosis Date  . Anxiety   . Complication of anesthesia    Pt. states takes long time to wake up from it.   . Cushing's disease ([place])   . Depression   . Diabetes ([place])   . Hyperlipidemia   . Hyperlipidemia   . Hypertension   . Morbid obesity ([place])   . Osteoporosis 07/19/2015  . Periprosthetic fracture 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[place]), R tibial plateau  07/18/2015  . Sleep apnea   . Uncontrolled diabetes mellitus with diabetic neuropathy, with long-term current use of insulin ([place]) 07/16/2015  . Vitamin D deficiency 07/19/2015    Patient Active Problem List   Diagnosis Date Noted  . Tracheostomy complication ([place])   . Tracheostomy dependence ([place])   . Obstructive sleep apnea syndrome   . MDD (major depressive disorder), recurrent episode, moderate ([place])   . Acute respiratory failure with hypoxemia ([place])   . SOB (shortness of breath)   . Tracheostomy status ([place])   . Hoarse voice quality   . Hypomagnesemia   . Shortness of breath   . Major depressive disorder, recurrent episode, severe ([place])   . Intentional overdose of drug in tablet form ([place]) 11/20/2017  . Intentional drug overdose ([place])   . Seizure ([place])   . Acute respiratory failure with hypercapnia ([place])   . Status epilepticus ([place])   .  Spondylosis without myelopathy or radiculopathy, cervical region 10/29/2017  . Abdominal pain, epigastric   . LUQ abdominal pain   . Nausea and vomiting   . Gastroparesis   . Hypertriglyceridemia 07/22/2015  . Diabetes mellitus due to underlying condition, uncontrolled, with diabetic neuropathy, with long-term current use of insulin ([place])   . Vitamin D deficiency 07/19/2015  . 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 Osteoporosis 07/19/2015  . Periprosthetic fracture 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[place]), R tibial plateau  07/18/2015  . Fracture, tibial plateau 07/16/2015  . Uncontrolled diabetes mellitus with diabetic neuropathy, with long-term current use of insulin ([place]) 07/16/2015  . Leukocytosis 07/16/2015  . Essential hypertension 07/16/2015  . Morbid obesity due to excess calories ([place]) 04/20/2014  . Hyperlipidemia 07/31/2012  . Cushing disease ([place]) 07/31/2012  . Hypothyroid 07/31/2012    Past Surgical History:  Procedure Laterality Date  . ANTERIOR TALOFIBULAR LIGAMENT REPAIR Left 11/15/2014   Procedure: ANTERIOR TALOFIBULAR LIGAMENT REPAIR;  Surgeon: Jana Half, DPM;  Location: [facility];  Service: Podiatry;  Laterality: Left;  . CESAREAN SECTION  dec 1997/  06-03-2001/   01-01-2005   BILATERAL TUBAL LIGATION WITH LAST ONE  . DILATION AND CURETTAGE OF UTERUS  1995   WITH SUCTION  . DIRECT LARYNGOSCOPY N/A 12/18/2017   Procedure: DIRECT LARYNGOSCOPY;  Surgeon: Izora Gala, MD;  Location: [facility];  Service: ENT;  Laterality: N/A;  . ESOPHAGOGASTRODUODENOSCOPY (EGD) WITH PROPOFOL N/A 10/04/2016   Procedure: ESOPHAGOGASTRODUODENOSCOPY (EGD) WITH PROPOFOL;  Surgeon: [name], MD;  [facility];  Service: Endoscopy;  Laterality: N/A;  . LAPAROSCOPIC CHOLECYSTECTOMY  09-25-2005  . ORIF TIBIA PLATEAU Right 07/19/2015   Procedure: OPEN REDUCTION INTERNAL FIXATION (ORIF) RIGHT TIBIAL PLATEAU;  Surgeon:  Altamese , MD;  Location: [facility];  Service: Orthopedics;  Laterality: Right;  . PARTIAL KNEE ARTHROPLASTY Right 04/19/2014   Procedure: RIGHT UNI KNEE ARTHROPLASTY MEDIALLY ;  Surgeon: Mauri Pole, MD;  [facility];  Service: Orthopedics;  Laterality: Right;  . TRACHEOSTOMY TUBE PLACEMENT N/A 11/28/2017   Procedure: TRACHEOSTOMY;  Surgeon: Izora Gala, MD;  Location: [facility];  Service: ENT;  Laterality: N/A;  . TUBAL LIGATION       OB History   None      Home Medications    Prior to Admission medications   Medication Sig Start Date End Date Taking? Authorizing Provider  acetaminophen (TYLENOL) 325 MG tablet Take 2 tablets (650 mg total) by mouth every 6 (six) hours as needed for mild pain or headache. Patient not taking: Reported on 04/15/2018 01/06/18   Nita Sells, MD  albuterol (PROVENTIL HFA;VENTOLIN HFA) 108 (90 Base) MCG/ACT inhaler Inhale 2 puffs into the lungs every 6 (six) hours as needed for wheezing or shortness of breath. 01/07/18   Nita Sells, MD  atorvastatin (LIPITOR) 20 MG tablet Take 20 mg by mouth at bedtime.    [provider]  buPROPion (WELLBUTRIN SR) 100 MG 12 hr tablet Take 100 mg by mouth 2 (two) times daily.    [provider]  cholestyramine (QUESTRAN) 4 g packet Take 1 packet (4 g total) by mouth every other day. 01/08/18   Nita Sells, MD  fluticasone (FLONASE) 50 MCG/ACT nasal spray Place 2 sprays into both nostrils daily. 04/14/18 04/14/19  Erick Colace, NP  insulin NPH-regular Human (NOVOLIN 70/30) (70-30) 100 UNIT/ML injection Inject 28 Units into the skin 2 (two) times daily with a meal. Patient taking differently: Inject 50 Units into the skin 2 (two) times daily.  07/21/15   Delfina Redwood, MD  levothyroxine (SYNTHROID, LEVOTHROID) 75 MCG tablet Take 1 tablet (75 mcg total) by mouth daily before breakfast. 01/07/18   Nita Sells, MD  metFORMIN (GLUCOPHAGE) 1000 MG tablet Take 1,000 mg by mouth  2 (two) times daily with a meal.    [provider]  metoprolol tartrate (LOPRESSOR) 25 MG tablet Take 0.5 tablets (12.5 mg total) by mouth 2 (two) times daily. 01/06/18   Nita Sells, MD  naproxen (NAPROSYN) 500 MG tablet Take 1 tablet (500 mg total) by mouth 2 (two) times daily. 10/31/16   Tanna Furry, MD  norethindrone (MICRONOR,CAMILA,ERRIN) 0.35 MG tablet Take 1 tablet by mouth daily.    [provider]  pantoprazole (PROTONIX) 40 MG tablet TAKE ONE TABLET BY MOUTH TWICE A DAY BEFORE MEALS Patient taking differently: Take 40 mg by mouth daily.  04/01/17   [name], MD  pioglitazone (ACTOS) 15 MG tablet Take 15 mg by mouth daily.    [provider]  quinapril (ACCUPRIL) 5 MG tablet Take 2.5 mg by mouth every morning.     [provider]  sucralfate (CARAFATE) 1 g tablet Take 1 tablet (1 g total) by mouth 4 (four) times daily -  with meals and at bedtime. Patient not taking: Reported on 04/15/2018 08/25/16   Theodosia Quay, MD  traZODone ([place])  50 MG tablet Take 50 mg by mouth at bedtime.    [provider]    Family History Family History  Adopted: Yes  Problem Relation Age of Onset  . Autism Son   . ADD / ADHD Son   . Apraxia Son     Social History Social History   Tobacco Use  . Smoking status: Current Every Day Smoker    Years: 4.00    Types: Cigarettes  . Smokeless tobacco: Never Used  Substance Use Topics  . Alcohol use: Yes    Comment: RARE  . Drug use: No    Types: Marijuana     Allergies   Penicillins   Review of Systems Review of Systems  All other systems reviewed and are negative.    Physical Exam Updated Vital Signs BP 122/87 (BP Location: Right Arm)   Pulse (!) 55   Temp 98 F (36.7 C) (Oral)   Resp 18   SpO2 100%   Physical Exam  Constitutional: She appears well-developed and well-nourished.  HENT:  Head: Normocephalic.  Tracheostomy without surrounding signs of stoma infection.   Moving air freely.  Eyes: EOM are normal.  Neck: Normal range of motion.  Pulmonary/Chest: Effort normal and breath sounds normal.  Abdominal: She exhibits no distension.  Musculoskeletal: Normal range of motion.  Neurological: She is alert.  Psychiatric: She has a normal mood and affect.  Nursing note and vitals reviewed.    ED Treatments / Results  Labs (all labs ordered are listed, but only abnormal results are displayed) Labs Reviewed - No data to display  EKG None  Radiology No results found.  Procedures Procedures (including critical care time)  Medications Ordered in ED Medications - No data to display   Initial Impression / Assessment and Plan / ED Course  I have reviewed the triage vital signs and the nursing notes.  Pertinent labs & imaging results that were available during my care of the patient were reviewed by me and considered in my medical decision making (see chart for details).     Tracheostomy obstruction relieved.  Discharged home in good condition  Final Clinical Impressions(s) / ED Diagnoses   Final diagnoses:  Tracheostomy complication, unspecified complication type [facility])    ED Discharge Orders    None       Jola Schmidt, MD 05/10/18 [phone]

## 2018-05-22 ENCOUNTER — Encounter (HOSPITAL_COMMUNITY): Payer: Self-pay

## 2018-05-22 ENCOUNTER — Emergency Department (HOSPITAL_COMMUNITY)
Admission: EM | Admit: 2018-05-22 | Discharge: 2018-05-22 | Disposition: A | Discharge status: Home / Self Care | Payer: Medicaid Other | Attending: Emergency Medicine | Admitting: Emergency Medicine

## 2018-05-22 ENCOUNTER — Other Ambulatory Visit: Payer: Self-pay

## 2018-05-22 ENCOUNTER — Emergency Department (HOSPITAL_COMMUNITY): Payer: Medicaid Other

## 2018-05-22 DIAGNOSIS — I10 Essential (primary) hypertension: Secondary | ICD-10-CM | POA: Insufficient documentation

## 2018-05-22 DIAGNOSIS — Z87891 Personal history of nicotine dependence: Secondary | ICD-10-CM | POA: Diagnosis not present

## 2018-05-22 DIAGNOSIS — Z794 Long term (current) use of insulin: Secondary | ICD-10-CM | POA: Diagnosis not present

## 2018-05-22 DIAGNOSIS — Z79899 Other long term (current) drug therapy: Secondary | ICD-10-CM | POA: Diagnosis not present

## 2018-05-22 DIAGNOSIS — J069 Acute upper respiratory infection, unspecified: Secondary | ICD-10-CM

## 2018-05-22 DIAGNOSIS — E039 Hypothyroidism, unspecified: Secondary | ICD-10-CM | POA: Insufficient documentation

## 2018-05-22 DIAGNOSIS — E1140 Type 2 diabetes mellitus with diabetic neuropathy, unspecified: Secondary | ICD-10-CM | POA: Diagnosis not present

## 2018-05-22 DIAGNOSIS — Z430 Encounter for attention to tracheostomy: Secondary | ICD-10-CM | POA: Diagnosis not present

## 2018-05-22 DIAGNOSIS — R0602 Shortness of breath: Secondary | ICD-10-CM | POA: Diagnosis present

## 2018-05-22 LAB — BASIC METABOLIC PANEL
ANION GAP: 7 (ref 5–15)
BUN: 8 mg/dL (ref 6–20)
CO2: 27 mmol/L (ref 22–32)
Calcium: 9.4 mg/dL (ref 8.9–10.3)
Chloride: 105 mmol/L (ref 98–111)
Creatinine, Ser: 0.78 mg/dL (ref 0.44–1.00)
GFR calc non Af Amer: >60 mL/min (ref >60)
GLUCOSE: 88 mg/dL (ref 70–99)
POTASSIUM: 4.7 mmol/L (ref 3.5–5.1)
Sodium: 139 mmol/L (ref 135–145)

## 2018-05-22 LAB — CBC
HEMATOCRIT: 35.6 % — AB (ref 36.0–46.0)
HEMOGLOBIN: 11 g/dL — AB (ref 12.0–15.0)
MCH: 28.1 pg (ref 26.0–34.0)
MCHC: 30.9 g/dL (ref 30.0–36.0)
MCV: 90.8 fL (ref 78.0–100.0)
Platelets: 389 10*3/uL (ref 150–400)
RBC: 3.92 MIL/uL (ref 3.87–5.11)
RDW: 14.2 % (ref 11.5–15.5)
WBC: 9.3 10*3/uL (ref 4.0–10.5)

## 2018-05-22 LAB — I-STAT BETA HCG BLOOD, ED (MC, WL, AP ONLY)

## 2018-05-22 LAB — I-STAT TROPONIN, ED: Troponin i, poc: 0.02 ng/mL (ref 0.00–0.08)

## 2018-05-22 LAB — GROUP A STREP BY PCR: GROUP A STREP BY PCR: NOT DETECTED

## 2018-05-22 NOTE — ED Notes (Signed)
Tracheostomy suctioned, no mucous produced.

## 2018-05-22 NOTE — ED Triage Notes (Signed)
Pt endorses shob since Monday, this usually happens due to her trach having mucous, pt has had minimal secretions since shob started. Airway intact. Speaking 4-5 word sentences but obvious labored breathing.

## 2018-05-22 NOTE — ED Provider Notes (Signed)
[facility] EMERGENCY DEPARTMENT Provider Note   CSN: [id number] Arrival date & time: 05/22/18  0934     History   Chief Complaint Chief Complaint  Patient presents with  . Shortness of Breath    HPI [name] is a 42 y.o. female.  HPI   Hx of trach placed 6 mos ago, difficulty breathing began Monday night, feels having difficulty moving air through trach, seems consistent with trach obstruction she has had previously. Yellow mucus. Also reports sore throat and congestion which also began Monday. Does report rhinnorhea present since prior nasal scope iwht ENT.  Denies shortness of breath, just notes difficulty moving air through trach.  Reports suction at home is not as strong as in hospital, and does not feel she has been able to suction it appropriately.  No fevers, no chills, no chest pain. Feels like she has a cold but her husband was worried because of the trach so she came here.   Past Medical History:  Diagnosis Date  . Anxiety   . Complication of anesthesia    Pt. states takes long time to wake up from it.   . Cushing's disease ([place])   . Depression   . Diabetes ([place])   . Hyperlipidemia   . Hyperlipidemia   . Hypertension   . Morbid obesity ([place])   . Osteoporosis 07/19/2015  . Periprosthetic fracture 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[place]), R tibial plateau  07/18/2015  . Sleep apnea   . Uncontrolled diabetes mellitus with diabetic neuropathy, with long-term current use of insulin ([place]) 07/16/2015  . Vitamin D deficiency 07/19/2015    Patient Active Problem List   Diagnosis Date Noted  . Tracheostomy complication ([place])   . Tracheostomy dependence ([place])   . Obstructive sleep apnea syndrome   . MDD (major depressive disorder), recurrent episode, moderate ([place])   . Acute respiratory failure with hypoxemia ([place])   . SOB (shortness of breath)   . Tracheostomy status ([place])   . Hoarse voice quality   . Hypomagnesemia   . Shortness of breath   .  Major depressive disorder, recurrent episode, severe ([place])   . Intentional overdose of drug in tablet form ([place]) 11/20/2017  . Intentional drug overdose ([place])   . Seizure ([place])   . Acute respiratory failure with hypercapnia ([place])   . Status epilepticus ([place])   . Spondylosis without myelopathy or radiculopathy, cervical region 10/29/2017  . Abdominal pain, epigastric   . LUQ abdominal pain   . Nausea and vomiting   . Gastroparesis   . Hypertriglyceridemia 07/22/2015  . Diabetes mellitus due to underlying condition, uncontrolled, with diabetic neuropathy, with long-term current use of insulin ([place])   . Vitamin D deficiency 07/19/2015  . 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 Osteoporosis 07/19/2015  . Periprosthetic fracture 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[place]), R tibial plateau  07/18/2015  . Fracture, tibial plateau 07/16/2015  . Uncontrolled diabetes mellitus with diabetic neuropathy, with long-term current use of insulin ([place]) 07/16/2015  . Leukocytosis 07/16/2015  . Essential hypertension 07/16/2015  . Morbid obesity due to excess calories ([place]) 04/20/2014  . Hyperlipidemia 07/31/2012  . Cushing disease ([place]) 07/31/2012  . Hypothyroid 07/31/2012    Past Surgical History:  Procedure Laterality Date  . ANTERIOR TALOFIBULAR LIGAMENT REPAIR Left 11/15/2014   Procedure: ANTERIOR TALOFIBULAR LIGAMENT REPAIR;  Surgeon: Jana Half, DPM;  Location: [facility];  Service: Podiatry;  Laterality: Left;  . CESAREAN  SECTION  dec 1997/  06-03-2001/   01-01-2005   BILATERAL TUBAL LIGATION WITH LAST ONE  . DILATION AND CURETTAGE OF UTERUS  1995   WITH SUCTION  . DIRECT LARYNGOSCOPY N/A 12/18/2017   Procedure: DIRECT LARYNGOSCOPY;  Surgeon: Izora Gala, MD;  Location: [facility];  Service: ENT;  Laterality: N/A;  . ESOPHAGOGASTRODUODENOSCOPY (EGD) WITH PROPOFOL N/A 10/04/2016   Procedure: ESOPHAGOGASTRODUODENOSCOPY (EGD) WITH  PROPOFOL;  Surgeon: [name], MD;  [facility];  Service: Endoscopy;  Laterality: N/A;  . LAPAROSCOPIC CHOLECYSTECTOMY  09-25-2005  . ORIF TIBIA PLATEAU Right 07/19/2015   Procedure: OPEN REDUCTION INTERNAL FIXATION (ORIF) RIGHT TIBIAL PLATEAU;  Surgeon: Altamese Yukon, MD;  Location: [facility];  Service: Orthopedics;  Laterality: Right;  . PARTIAL KNEE ARTHROPLASTY Right 04/19/2014   Procedure: RIGHT UNI KNEE ARTHROPLASTY MEDIALLY ;  Surgeon: Mauri Pole, MD;  [facility];  Service: Orthopedics;  Laterality: Right;  . TRACHEOSTOMY TUBE PLACEMENT N/A 11/28/2017   Procedure: TRACHEOSTOMY;  Surgeon: Izora Gala, MD;  Location: [facility];  Service: ENT;  Laterality: N/A;  . TUBAL LIGATION       OB History   None      Home Medications    Prior to Admission medications   Medication Sig Start Date End Date Taking? Authorizing Provider  acetaminophen (TYLENOL) 325 MG tablet Take 2 tablets (650 mg total) by mouth every 6 (six) hours as needed for mild pain or headache. 01/06/18  Yes Nita Sells, MD  albuterol (PROVENTIL HFA;VENTOLIN HFA) 108 (90 Base) MCG/ACT inhaler Inhale 2 puffs into the lungs every 6 (six) hours as needed for wheezing or shortness of breath. 01/07/18  Yes Nita Sells, MD  atorvastatin (LIPITOR) 20 MG tablet Take 20 mg by mouth at bedtime.   Yes [provider]  buPROPion (WELLBUTRIN SR) 100 MG 12 hr tablet Take 100 mg by mouth 2 (two) times daily.   Yes [provider]  cholestyramine (QUESTRAN) 4 g packet Take 1 packet (4 g total) by mouth every other day. 01/08/18  Yes Nita Sells, MD  fluticasone (FLONASE) 50 MCG/ACT nasal spray Place 2 sprays into both nostrils daily. 04/14/18 04/14/19 Yes Erick Colace, NP  gabapentin (NEURONTIN) 300 MG capsule Take 300 mg by mouth 2 (two) times daily.   Yes [provider]  insulin NPH-regular Human (NOVOLIN 70/30) (70-30) 100 UNIT/ML injection Inject 28 Units into the skin  2 (two) times daily with a meal. Patient taking differently: Inject 50 Units into the skin 2 (two) times daily.  07/21/15  Yes Delfina Redwood, MD  levothyroxine (SYNTHROID, LEVOTHROID) 75 MCG tablet Take 1 tablet (75 mcg total) by mouth daily before breakfast. 01/07/18  Yes Nita Sells, MD  metFORMIN (GLUCOPHAGE) 1000 MG tablet Take 1,000 mg by mouth 2 (two) times daily with a meal.   Yes [provider]  metoprolol tartrate (LOPRESSOR) 25 MG tablet Take 0.5 tablets (12.5 mg total) by mouth 2 (two) times daily. 01/06/18  Yes Nita Sells, MD  naproxen (NAPROSYN) 500 MG tablet Take 1 tablet (500 mg total) by mouth 2 (two) times daily. 10/31/16  Yes Tanna Furry, MD  norethindrone (MICRONOR,CAMILA,ERRIN) 0.35 MG tablet Take 1 tablet by mouth daily.   Yes [provider]  pantoprazole (PROTONIX) 40 MG tablet TAKE ONE TABLET BY MOUTH TWICE A DAY BEFORE MEALS Patient taking differently: Take 40 mg by mouth daily.  04/01/17  Yes [name], MD  pioglitazone (ACTOS) 15 MG tablet Take 15 mg  by mouth daily.   Yes [provider]  quinapril (ACCUPRIL) 5 MG tablet Take 2.5 mg by mouth every morning.    Yes [provider]  traZODone (DESYREL) 50 MG tablet Take 50 mg by mouth at bedtime.   Yes [provider]  sucralfate (CARAFATE) 1 g tablet Take 1 tablet (1 g total) by mouth 4 (four) times daily -  with meals and at bedtime. Patient not taking: Reported on 04/15/2018 08/25/16   Theodosia Quay, MD    Family History Family History  Adopted: Yes  Problem Relation Age of Onset  . Autism Son   . ADD / ADHD Son   . Apraxia Son     Social History Social History   Tobacco Use  . Smoking status: Former Smoker    Years: 4.00    Last attempt to quit: 05/22/2017    Years since quitting: 1.0  . Smokeless tobacco: Never Used  Substance Use Topics  . Alcohol use: Yes    Comment: RARE  . Drug use: No    Types: Marijuana     Allergies     Penicillins   Review of Systems Review of Systems  Constitutional: Negative for fever.  HENT: Positive for congestion and sore throat.   Eyes: Negative for visual disturbance.  Respiratory: Positive for cough and shortness of breath (feels difficulty breathing through trach, but denies other dyspnea).   Cardiovascular: Negative for chest pain.  Gastrointestinal: Negative for abdominal pain, nausea and vomiting.  Genitourinary: Negative for difficulty urinating.  Skin: Negative for rash.  Neurological: Negative for syncope and headaches.     Physical Exam Updated Vital Signs BP 107/69   Pulse (!) 101   Temp 97.9 F (36.6 C) (Oral)   Resp (!) 29   Ht 5\' 2"  (1.575 m)   Wt 132.5 kg   LMP  (LMP Unknown)   SpO2 96%   BMI 53.41 kg/m   Physical Exam  Constitutional: She is oriented to person, place, and time. She appears well-developed and well-nourished. No distress.  HENT:  Head: Normocephalic and atraumatic.  Eyes: Conjunctivae and EOM are normal.  Neck: Normal range of motion.  Tracheostomy in place, no drainage  Cardiovascular: Normal rate, regular rhythm, normal heart sounds and intact distal pulses. Exam reveals no gallop and no friction rub.  No murmur heard. Pulmonary/Chest: Effort normal and breath sounds normal. No respiratory distress. She has no wheezes. She has no rales.  Abdominal: Soft. She exhibits no distension. There is no tenderness. There is no guarding.  Musculoskeletal: She exhibits no edema or tenderness.  Neurological: She is alert and oriented to person, place, and time.  Skin: Skin is warm and dry. No rash noted. She is not diaphoretic. No erythema.  Nursing note and vitals reviewed.    ED Treatments / Results  Labs (all labs ordered are listed, but only abnormal results are displayed) Labs Reviewed  CBC - Abnormal; Notable for the following components:      Result Value   Hemoglobin 11.0 (*)    HCT 35.6 (*)    All other components within  normal limits  GROUP A STREP BY PCR  BASIC METABOLIC PANEL  I-STAT TROPONIN, ED  I-STAT BETA HCG BLOOD, ED ([facility], [facility], [facility] ONLY)    EKG EKG Interpretation  Date/Time:  Thursday May 22 2018 09:47:54 EDT Ventricular Rate:  101 PR Interval:  144 QRS Duration: 74 QT Interval:  332 QTC Calculation: 430 R Axis:   11 Text  Interpretation:  Sinus tachycardia Otherwise normal ECG No significant change since last tracing Confirmed by Gareth Morgan [phone]) on 05/22/2018 2:18:14 PM   Radiology Dg Chest 2 View  Result Date: 05/22/2018 CLINICAL DATA:  Short of breath for 2 days, tracheostomy in place EXAM: CHEST - 2 VIEW COMPARISON:  Portable chest x-ray of 05/19/2018 FINDINGS: The lungs remain clear. Tracheostomy appears unchanged in position. Mediastinal hilar contours are stable and the heart is mildly enlarged and stable. No bony abnormality is seen. IMPRESSION: No active lung disease.  No change in position of tracheostomy. Electronically Signed   By: Ivar Drape M.D.   On: 05/22/2018 10:46    Procedures Procedures (including critical care time)  Medications Ordered in ED Medications - No data to display   Initial Impression / Assessment and Plan / ED Course  I have reviewed the triage vital signs and the nursing notes.  Pertinent labs & imaging results that were available during my care of the patient were reviewed by me and considered in my medical decision making (see chart for details).     42 year old female with history above including history of tracheostomy placement 6 months ago, presents with concern for difficulty breathing/sensation of tracheal obstruction, sore throat, congestion.  XR without pneumonia, no CHF. No sign of anemia. No wheezing or stridor on exam, appears comfortable at time of my exam, yellow mucous seen from tracheostomy.  Suspect viral etiology of cough, congestion, sore throat--however, recommend follow up with ENT closely as an outpatient. Patient  discharged in stable condition with understanding of reasons to return.   Final Clinical Impressions(s) / ED Diagnoses   Final diagnoses:  Upper respiratory tract infection, unspecified type  Tracheostomy care [facility])    ED Discharge Orders    None       Gareth Morgan, MD 05/22/18 2026

## 2018-05-22 NOTE — ED Notes (Signed)
Discharge instructions discussed with Pt. Pt verbalized understanding. Pt stable and leaving via wc.

## 2018-05-26 ENCOUNTER — Other Ambulatory Visit: Payer: Self-pay | Admitting: Acute Care

## 2018-05-26 DIAGNOSIS — Z930 Tracheostomy status: Secondary | ICD-10-CM

## 2018-05-28 ENCOUNTER — Ambulatory Visit (HOSPITAL_COMMUNITY)
Admission: RE | Admit: 2018-05-28 | Discharge: 2018-05-28 | Disposition: A | Discharge status: Home / Self Care | Payer: Medicaid Other | Source: Ambulatory Visit | Attending: Acute Care | Admitting: Acute Care

## 2018-05-28 DIAGNOSIS — E662 Morbid (severe) obesity with alveolar hypoventilation: Secondary | ICD-10-CM | POA: Diagnosis not present

## 2018-05-28 DIAGNOSIS — Z4682 Encounter for fitting and adjustment of non-vascular catheter: Secondary | ICD-10-CM | POA: Diagnosis not present

## 2018-05-28 DIAGNOSIS — J386 Stenosis of larynx: Secondary | ICD-10-CM | POA: Insufficient documentation

## 2018-05-28 NOTE — Progress Notes (Signed)
Tracheostomy Procedure Note  MARGEART ALLENDER [id number] [birth year]  Pre Procedure Tracheostomy Information  Trach Brand: Shiley Size: 6.0 Style: Proximal and Uncuffed Secured by: Velcro   Procedure: trach change    Post Procedure Tracheostomy Information  Trach Brand: Shiley Size: 6.0 Style: Proximal and Uncuffed Secured by: Velcro   Post Procedure Evaluation:  ETCO2 positive color change from yellow to purple : Yes.   Vital signs: pulse 115, respirations 24 and pulse oximetry 95 % Patients current condition: stable Complications: No apparent complications Trach site exam: clean, dry Wound care done: dry and 4 x 4 gauze Patient did tolerate procedure well.   Education: none  Prescription needs: none    Additional needs: Inner cannulas given to supplement home supply.  Follow up in 8 weeks.

## 2018-05-28 NOTE — Progress Notes (Signed)
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  Last note from ENT in August 2019 notes could likely be a decannulation candidate if she can get less than 200 pounds.  She is now followed at the tracheostomy clinic for routine tracheostomy care ROS  General: No fever, no sick exposures.  HEENT: No headache, sinus congestion, sore throat, or tracheostomy discharge.  Pulmonary: Did have thicker tracheal secretions, however I spoke to her on the phone about a week ago and recommended pushing p.o. fluids as well as Mucinex, she says this is "been a life saver" denies cough, denies shortness of breath, denies chest pain or wheezing. Hent: Denies chest pain, palpitation, orthopnea.  GI: Denies nausea vomiting diarrhea GU: Denies dysuria, hesitancy, or frequency.  Neuro: Denies headache, lightheadedness, gait disturbance or memory change Vital signs:  Reviewed.  Saturations 95% on room air respirations 24 not labored Exam:  General: This is an obese 42 year old female who is ambulatory into the tracheostomy clinic today HEENT neck is large, has facial adiposity.  Size 6 proximal XLT tracheostomy is unremarkable, tracheostomy stoma slightly erythemic around stoma edges Pulmonary: Clear to auscultation Cardiac: Regular rate and rhythm Abdomen: Obese, soft, nontender.  Extremities: No edema brisk cap refill warm dry Neuro: Awake and oriented no focal deficits equal strength Trach change/procedure: The size 6 proximal XLT tracheostomy was removed without difficulty.  Tracheostomy stoma was localized with 1 viscous lidocaine jelly.  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  She remains tracheostomy dependent in the setting of her severe obesity, sleep apnea, and obesity hypoventilation syndrome.  I agree with ENT that she needs to get sub-200 pounds before we should consider formal repeat sleep study.  For now we should work under the assumption that the trach will be per minute Plan  Return 8 weeks for routine tracheostomy change    Visit time: 32 minutes minutes.   Erick Colace ACNP-BC [place]

## 2018-06-26 ENCOUNTER — Ambulatory Visit (HOSPITAL_COMMUNITY): Payer: Self-pay

## 2018-06-28 ENCOUNTER — Encounter (HOSPITAL_COMMUNITY): Payer: Self-pay | Admitting: *Deleted

## 2018-06-28 ENCOUNTER — Emergency Department (HOSPITAL_COMMUNITY): Payer: Medicaid Other

## 2018-06-28 ENCOUNTER — Emergency Department (HOSPITAL_COMMUNITY)
Admission: EM | Admit: 2018-06-28 | Discharge: 2018-06-28 | Disposition: A | Discharge status: Home / Self Care | Payer: Medicaid Other | Attending: Emergency Medicine | Admitting: Emergency Medicine

## 2018-06-28 ENCOUNTER — Other Ambulatory Visit: Payer: Self-pay

## 2018-06-28 DIAGNOSIS — J069 Acute upper respiratory infection, unspecified: Secondary | ICD-10-CM | POA: Insufficient documentation

## 2018-06-28 DIAGNOSIS — Z79899 Other long term (current) drug therapy: Secondary | ICD-10-CM | POA: Diagnosis not present

## 2018-06-28 DIAGNOSIS — J029 Acute pharyngitis, unspecified: Secondary | ICD-10-CM | POA: Insufficient documentation

## 2018-06-28 DIAGNOSIS — K6289 Other specified diseases of anus and rectum: Secondary | ICD-10-CM

## 2018-06-28 DIAGNOSIS — Z794 Long term (current) use of insulin: Secondary | ICD-10-CM | POA: Insufficient documentation

## 2018-06-28 DIAGNOSIS — I10 Essential (primary) hypertension: Secondary | ICD-10-CM | POA: Diagnosis not present

## 2018-06-28 DIAGNOSIS — E119 Type 2 diabetes mellitus without complications: Secondary | ICD-10-CM | POA: Diagnosis not present

## 2018-06-28 DIAGNOSIS — Z87891 Personal history of nicotine dependence: Secondary | ICD-10-CM | POA: Insufficient documentation

## 2018-06-28 DIAGNOSIS — Z930 Tracheostomy status: Secondary | ICD-10-CM | POA: Insufficient documentation

## 2018-06-28 LAB — I-STAT CHEM 8, ED
BUN: 16 mg/dL (ref 6–20)
CREATININE: 1 mg/dL (ref 0.44–1.00)
Calcium, Ion: 1.19 mmol/L (ref 1.15–1.40)
Chloride: 102 mmol/L (ref 98–111)
Glucose, Bld: 225 mg/dL — ABNORMAL HIGH (ref 70–99)
HEMATOCRIT: 31 % — AB (ref 36.0–46.0)
Hemoglobin: 10.5 g/dL — ABNORMAL LOW (ref 12.0–15.0)
Potassium: 4.3 mmol/L (ref 3.5–5.1)
Sodium: 136 mmol/L (ref 135–145)
TCO2: 27 mmol/L (ref 22–32)

## 2018-06-28 LAB — CBC WITH DIFFERENTIAL/PLATELET
ABS IMMATURE GRANULOCYTES: 0.04 10*3/uL (ref 0.00–0.07)
Basophils Absolute: 0 10*3/uL (ref 0.0–0.1)
Basophils Relative: 1 %
Eosinophils Absolute: 0.1 10*3/uL (ref 0.0–0.5)
Eosinophils Relative: 1 %
HCT: 33.4 % — ABNORMAL LOW (ref 36.0–46.0)
Hemoglobin: 10.2 g/dL — ABNORMAL LOW (ref 12.0–15.0)
IMMATURE GRANULOCYTES: 1 %
Lymphocytes Relative: 35 %
Lymphs Abs: 2.3 10*3/uL (ref 0.7–4.0)
MCH: 27.6 pg (ref 26.0–34.0)
MCHC: 30.5 g/dL (ref 30.0–36.0)
MCV: 90.3 fL (ref 80.0–100.0)
MONOS PCT: 7 %
Monocytes Absolute: 0.5 10*3/uL (ref 0.1–1.0)
NEUTROS ABS: 3.7 10*3/uL (ref 1.7–7.7)
NEUTROS PCT: 55 %
PLATELETS: 442 10*3/uL — AB (ref 150–400)
RBC: 3.7 MIL/uL — AB (ref 3.87–5.11)
RDW: 12.6 % (ref 11.5–15.5)
WBC: 6.6 10*3/uL (ref 4.0–10.5)
nRBC: 0 % (ref 0.0–0.2)

## 2018-06-28 LAB — CBG MONITORING, ED: GLUCOSE-CAPILLARY: 214 mg/dL — AB (ref 70–99)

## 2018-06-28 MED ORDER — DEXAMETHASONE SODIUM PHOSPHATE 10 MG/ML IJ SOLN
10.0000 mg | Freq: Once | INTRAMUSCULAR | Status: AC
  Administered 2018-06-28: 10 mg via INTRAMUSCULAR
  Filled 2018-06-28: qty 1

## 2018-06-28 MED ORDER — IBUPROFEN 600 MG PO TABS: 600.0000 mg | ORAL_TABLET | Freq: Four times a day (QID) | ORAL | 0 refills | Status: DC | PRN

## 2018-06-28 MED ORDER — IOPAMIDOL (ISOVUE-300) INJECTION 61%
100.0000 mL | Freq: Once | INTRAVENOUS | Status: AC | PRN
  Administered 2018-06-28: 100 mL via INTRAVENOUS

## 2018-06-28 MED ORDER — CLINDAMYCIN HCL 300 MG PO CAPS: 300.0000 mg | ORAL_CAPSULE | Freq: Three times a day (TID) | ORAL | 0 refills | Status: DC

## 2018-06-28 MED ORDER — SODIUM CHLORIDE 0.9 % IJ SOLN
INTRAMUSCULAR | Status: AC
  Filled 2018-06-28: qty 50

## 2018-06-28 MED ORDER — AZITHROMYCIN 250 MG PO TABS: 250.0000 mg | ORAL_TABLET | Freq: Every day | ORAL | 0 refills | Status: DC

## 2018-06-28 MED ORDER — IOPAMIDOL (ISOVUE-300) INJECTION 61%
INTRAVENOUS | Status: AC
  Filled 2018-06-28: qty 100

## 2018-06-28 MED ORDER — SODIUM CHLORIDE 0.9 % IV BOLUS
500.0000 mL | Freq: Once | INTRAVENOUS | Status: AC
  Administered 2018-06-28: 500 mL via INTRAVENOUS

## 2018-06-28 MED ORDER — NAPROXEN 500 MG PO TABS
500.0000 mg | ORAL_TABLET | Freq: Once | ORAL | Status: AC
  Administered 2018-06-28: 500 mg via ORAL
  Filled 2018-06-28: qty 1

## 2018-06-28 NOTE — ED Triage Notes (Signed)
Pt reports pain in her throat for about 3-4 weeks, she has been on 2 doses of abx for the same by her doctor. She also c/o burning and pain around the rectal area for 1.5 months, have been using hydrocortisone cream for the same. Vaginal pain about 1 month, no abnormal bleeding or discharge.

## 2018-06-28 NOTE — ED Notes (Signed)
C/o of rectal pain with strain and dry cough related to soar throat/ note trach in place with purple cap

## 2018-06-28 NOTE — ED Provider Notes (Signed)
No evidence of perirectal abscess. Acute pharyngitis on neck CT without complicating appearance. Home with abx. pcp follow up  I personally reviewed the imaging tests through PACS system I reviewed available ER/hospitalization records through the EMR  Dg Chest 2 View  Result Date: 06/28/2018 CLINICAL DATA:  42 y/o  F; throat pain for 3-4 weeks. EXAM: CHEST - 2 VIEW COMPARISON:  05/22/2018 chest radiograph. FINDINGS: Stable cardiomegaly given projection and technique. Stable positioning of tracheostomy tube. No consolidation, effusion, or pneumothorax. No acute osseous abnormality is evident. IMPRESSION: Stable cardiomegaly. No acute pulmonary process identified. Electronically Signed   By: Kristine Garbe M.D.   On: 06/28/2018 06:36   Ct Soft Tissue Neck W Contrast  Result Date: 06/28/2018 CLINICAL DATA:  Sore throat for 3 weeks. Epiglottitis or tonsillitis suspected. EXAM: CT NECK WITH CONTRAST TECHNIQUE: Multidetector CT imaging of the neck was performed using the standard protocol following the bolus administration of intravenous contrast. CONTRAST:  132mL ISOVUE-300 IOPAMIDOL (ISOVUE-300) INJECTION 61% COMPARISON:  CT of the neck 12/10/2017. FINDINGS: Pharynx and larynx: There is marked enlargement of the adenoid tissue. No discrete mass lesion is present. Palatine tonsils are moderately enlarged. No abscess or mass is present. Tongue base is within normal limits. Epiglottis is unremarkable. Hypopharynx is within normal limits. Vocal cords are midline. Tracheostomy tube is in place. Salivary glands: Submandibular and parotid glands are within normal limits bilaterally. Thyroid: Unremarkable. Lymph nodes: Enlarged bilateral to get a good gastric lymph nodes are likely reactive. Other enlarged level 2 nodes are more prominent left than right. Vascular: No significant vascular calcifications are present. Limited intracranial: Unremarkable. Visualized orbits: Bilateral lens replacements are  present. Globes and orbits are within normal limits otherwise. Mastoids and visualized paranasal sinuses: The paranasal sinuses and mastoid air cells are clear. Skeleton: Vertebral body heights are normal. Alignment is anatomic. No focal lytic or blastic lesions are present. Uncovertebral and facet disease is greatest at C5-6 with osseous foraminal narrowing bilaterally. Upper chest: The lung apices are clear. IMPRESSION: 1. Prominent adenoid tissue and palatine tonsils compatible with acute pharyngitis. No abscess or mass lesion is present. 2. Left greater than right level 2 cervical adenopathy is likely reactive. 3. Tracheostomy tube in place without complication. 4. Degenerative changes of the cervical spine are most pronounced at C5-6. Electronically Signed   By: San Morelle M.D.   On: 06/28/2018 10:00   Ct Pelvis W Contrast  Result Date: 06/28/2018 CLINICAL DATA:  42 year old with concern for rectal abscess. Pain and burning around the rectum. EXAM: CT PELVIS WITH CONTRAST TECHNIQUE: Multidetector CT imaging of the pelvis was performed using the standard protocol following the bolus administration of intravenous contrast. CONTRAST:  126mL ISOVUE-300 IOPAMIDOL (ISOVUE-300) INJECTION 61% COMPARISON:  10/31/2016 FINDINGS: Urinary Tract: Normal appearance of the urinary bladder. Limited evaluation of the kidneys and no gross abnormality to the visualized ureters. Bowel: Normal appearance of the visualized bowel structures. Normal appearance of the rectum and perianal region. There is no significant soft tissue swelling in the perineum. Vascular/Lymphatic: Small lymph nodes in the inguinal regions and lower pelvis are again noted. Lower right external iliac lymph node has mildly enlarged in size measuring 1.2 cm in the short axis and previously measured 0.9 cm in the short axis. Inguinal lymph nodes are slightly more prominent than they were on the prior examination. Atherosclerotic calcifications in  the iliac arteries. Reproductive:  The uterus and adnexal structures are unremarkable. Other: No free fluid. Again noted are small ventral hernias containing fat.  Mild subcutaneous edema. Musculoskeletal: No acute bone abnormality. Degenerative facet disease in lower lumbar spine. IMPRESSION: 1. No evidence for a perirectal or perianal abscess. 2. Small ventral hernias containing fat. 3. Slightly enlarged inguinal and lower pelvic lymph nodes. Findings are nonspecific but likely reactive. Electronically Signed   By: Markus Daft M.D.   On: 06/28/2018 10:23      Jola Schmidt, MD 06/28/18 1047

## 2018-06-28 NOTE — ED Notes (Signed)
Pt states to be in extreme pain, pt stated that she was unable to sit up as "everything hurts too bad"

## 2018-06-28 NOTE — ED Provider Notes (Addendum)
[facility] DEPT Provider Note   CSN: [id number] Arrival date & time: 06/28/18  0032     History   Chief Complaint Chief Complaint  Patient presents with  . Sore Throat  . Rectal Pain    HPI [name] is a 42 y.o. female.  HPI  42 year old female comes in with chief complaint of sore throat.  She never complained of rectal pain during our encounter.  Patient has history of Cushing's disease, chronic respiratory failure with trach in place and diabetes.  Patient has been having sore throat for the last 3 to 4 weeks.  She has been on antibiotics on 2 separate occasions, but she continues to have the pain.  She reports that it has been difficult for her to swallow because of her pain.  Patient denies any fevers, chills.  She has trach in place, therefore she denies any new worsening in her respiration.  Patient also denies any cough, review of system is positive for URI-like symptoms without any fevers.  Past Medical History:  Diagnosis Date  . Anxiety   . Complication of anesthesia    Pt. states takes long time to wake up from it.   . Cushing's disease ([place])   . Depression   . Diabetes ([place])   . Hyperlipidemia   . Hyperlipidemia   . Hypertension   . Morbid obesity ([place])   . Osteoporosis 07/19/2015  . 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 Sleep apnea   . Uncontrolled diabetes mellitus with diabetic neuropathy, with long-term current use of insulin ([place]) 07/16/2015  . Vitamin D deficiency 07/19/2015    Patient Active Problem List   Diagnosis Date Noted  . Tracheostomy complication ([place])   . Tracheostomy dependence ([place])   . Obstructive sleep apnea syndrome   . MDD (major depressive disorder), recurrent episode, moderate ([place])   . Acute respiratory failure with hypoxemia ([place])   . SOB (shortness of breath)   . Tracheostomy status ([place])   . Hoarse voice quality   . Hypomagnesemia    . Shortness of breath   . Major depressive disorder, recurrent episode, severe ([place])   . Intentional overdose of drug in tablet form ([place]) 11/20/2017  . Intentional drug overdose ([place])   . Seizure ([place])   . Acute respiratory failure with hypercapnia ([place])   . Status epilepticus ([place])   . Spondylosis without myelopathy or radiculopathy, cervical region 10/29/2017  . Abdominal pain, epigastric   . LUQ abdominal pain   . Nausea and vomiting   . Gastroparesis   . Hypertriglyceridemia 07/22/2015  . Diabetes mellitus due to underlying condition, uncontrolled, with diabetic neuropathy, with long-term current use of insulin ([place])   . Vitamin D deficiency 07/19/2015  . 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 Osteoporosis 07/19/2015  . 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 Fracture, tibial plateau 07/16/2015  . Uncontrolled diabetes mellitus with diabetic neuropathy, with long-term current use of insulin ([place]) 07/16/2015  . Leukocytosis 07/16/2015  . Essential hypertension 07/16/2015  . Morbid obesity due to excess calories ([place]) 04/20/2014  . Hyperlipidemia 07/31/2012  . Cushing disease ([place]) 07/31/2012  . Hypothyroid 07/31/2012    Past Surgical History:  Procedure Laterality Date  . ANTERIOR TALOFIBULAR LIGAMENT REPAIR Left 11/15/2014   Procedure: ANTERIOR TALOFIBULAR LIGAMENT REPAIR;  Surgeon: Jana Half, DPM;  Location: [facility];  Service: Podiatry;  Laterality: Left;  . CESAREAN SECTION  dec 1997/  06-03-2001/   01-01-2005   BILATERAL TUBAL LIGATION WITH LAST ONE  . DILATION AND CURETTAGE OF UTERUS  1995   WITH SUCTION  . DIRECT LARYNGOSCOPY N/A 12/18/2017   Procedure: DIRECT LARYNGOSCOPY;  Surgeon: Izora Gala, MD;  Location: [facility];  Service: ENT;  Laterality: N/A;  . ESOPHAGOGASTRODUODENOSCOPY (EGD) WITH PROPOFOL N/A 10/04/2016   Procedure:  ESOPHAGOGASTRODUODENOSCOPY (EGD) WITH PROPOFOL;  Surgeon: [name], MD;  Location: [facility];  Service: Endoscopy;  Laterality: N/A;  . LAPAROSCOPIC CHOLECYSTECTOMY  09-25-2005  . ORIF TIBIA PLATEAU Right 07/19/2015   Procedure: OPEN REDUCTION INTERNAL FIXATION (ORIF) RIGHT TIBIAL PLATEAU;  Surgeon: Altamese Villalba, MD;  Location: [facility];  Service: Orthopedics;  Laterality: Right;  . PARTIAL KNEE ARTHROPLASTY Right 04/19/2014   Procedure: RIGHT UNI KNEE ARTHROPLASTY MEDIALLY ;  Surgeon: Mauri Pole, MD;  Location: [facility];  Service: Orthopedics;  Laterality: Right;  . TRACHEOSTOMY TUBE PLACEMENT N/A 11/28/2017   Procedure: TRACHEOSTOMY;  Surgeon: Izora Gala, MD;  Location: [facility];  Service: ENT;  Laterality: N/A;  . TUBAL LIGATION       OB History   None      Home Medications    Prior to Admission medications   Medication Sig Start Date End Date Taking? Authorizing Provider  insulin NPH-regular Human (NOVOLIN 70/30) (70-30) 100 UNIT/ML injection Inject 28 Units into the skin 2 (two) times daily with a meal. Patient taking differently: Inject 50 Units into the skin 2 (two) times daily.  07/21/15  Yes Delfina [place]wood, MD  metFORMIN (GLUCOPHAGE) 1000 MG tablet Take 1,000 mg by mouth 2 (two) times daily with a meal.   Yes [provider]  acetaminophen (TYLENOL) 325 MG tablet Take 2 tablets (650 mg total) by mouth every 6 (six) hours as needed for mild pain or headache. 01/06/18   Nita Sells, MD  albuterol (PROVENTIL HFA;VENTOLIN HFA) 108 (90 Base) MCG/ACT inhaler Inhale 2 puffs into the lungs every 6 (six) hours as needed for wheezing or shortness of breath. 01/07/18   Nita Sells, MD  atorvastatin (LIPITOR) 20 MG tablet Take 20 mg by mouth at bedtime.    [provider]  buPROPion (WELLBUTRIN SR) 100 MG 12 hr tablet Take 100 mg by mouth 2 (two) times daily.    [provider]  cholestyramine (QUESTRAN) 4 g packet Take 1 packet (4 g  total) by mouth every other day. 01/08/18   Nita Sells, MD  fluticasone (FLONASE) 50 MCG/ACT nasal spray Place 2 sprays into both nostrils daily. 04/14/18 04/14/19  Erick Colace, NP  gabapentin (NEURONTIN) 300 MG capsule Take 300 mg by mouth 2 (two) times daily.    [provider]  ibuprofen (ADVIL,MOTRIN) 600 MG tablet Take 1 tablet (600 mg total) by mouth every 6 (six) hours as needed. 06/28/18   Varney Biles, MD  levothyroxine (SYNTHROID, LEVOTHROID) 75 MCG tablet Take 1 tablet (75 mcg total) by mouth daily before breakfast. 01/07/18   Nita Sells, MD  metoprolol tartrate (LOPRESSOR) 25 MG tablet Take 0.5 tablets (12.5 mg total) by mouth 2 (two) times daily. 01/06/18   Nita Sells, MD  naproxen (NAPROSYN) 500 MG tablet Take 1 tablet (500 mg total) by mouth 2 (two) times daily. 10/31/16   Tanna Furry, MD  norethindrone (MICRONOR,CAMILA,ERRIN) 0.35 MG tablet Take 1 tablet by mouth daily.    [provider]  pantoprazole (PROTONIX) 40 MG tablet TAKE ONE  TABLET BY MOUTH TWICE A DAY BEFORE MEALS Patient taking differently: Take 40 mg by mouth daily.  04/01/17   [name], MD  pioglitazone (ACTOS) 15 MG tablet Take 15 mg by mouth daily.    [provider]  quinapril (ACCUPRIL) 5 MG tablet Take 2.5 mg by mouth every morning.     [provider]  sucralfate (CARAFATE) 1 g tablet Take 1 tablet (1 g total) by mouth 4 (four) times daily -  with meals and at bedtime. Patient not taking: Reported on 04/15/2018 08/25/16   Theodosia Quay, MD  traZODone (DESYREL) 50 MG tablet Take 50 mg by mouth at bedtime.    [provider]    Family History Family History  Adopted: Yes  Problem Relation Age of Onset  . Autism Son   . ADD / ADHD Son   . Apraxia Son     Social History Social History   Tobacco Use  . Smoking status: Former Smoker    Years: 4.00    Last attempt to quit: 05/22/2017    Years since quitting: 1.1  . Smokeless  tobacco: Never Used  Substance Use Topics  . Alcohol use: Yes    Comment: RARE  . Drug use: No    Types: Marijuana     Allergies   Penicillins   Review of Systems Review of Systems  Constitutional: Positive for activity change.  HENT: Positive for congestion and sore throat.   Respiratory: Positive for cough. Negative for shortness of breath.   Cardiovascular: Negative for chest pain.  Allergic/Immunologic: Positive for immunocompromised state.  Hematological: Does not bruise/bleed easily.  All other systems reviewed and are negative.    Physical Exam Updated Vital Signs BP 126/75   Pulse 94   Temp 98.5 F (36.9 C) (Oral)   Resp 18   SpO2 96%   Physical Exam  Constitutional: She is oriented to person, place, and time. She appears well-developed.  HENT:  Head: Normocephalic and atraumatic.  Exam is limited because of his body habitus. No clear evidence of tonsillar enlargement.  Patient does have pharyngeal erythema  Eyes: EOM are normal.  Neck:  Trach in place  Cardiovascular: Normal rate.  Pulmonary/Chest: Effort normal. She has no wheezes. She has no rhonchi. She has no rales.  Neurological: She is alert and oriented to person, place, and time.  Skin: Skin is warm.  Nursing note and vitals reviewed.    ED Treatments / Results  Labs (all labs ordered are listed, but only abnormal results are displayed) Labs Reviewed  CBC WITH DIFFERENTIAL/PLATELET - Abnormal; Notable for the following components:      Result Value   RBC 3.70 (*)    Hemoglobin 10.2 (*)    HCT 33.4 (*)    Platelets 442 (*)    All other components within normal limits  I-STAT CHEM 8, ED - Abnormal; Notable for the following components:   Glucose, Bld 225 (*)    Hemoglobin 10.5 (*)    HCT 31.0 (*)    All other components within normal limits  CBG MONITORING, ED - Abnormal; Notable for the following components:   Glucose-Capillary 214 (*)    All other components within normal limits     EKG None  Radiology Dg Chest 2 View  Result Date: 06/28/2018 CLINICAL DATA:  42 y/o  F; throat pain for 3-4 weeks. EXAM: CHEST - 2 VIEW COMPARISON:  05/22/2018 chest radiograph. FINDINGS: Stable cardiomegaly given projection and technique. Stable positioning  of tracheostomy tube. No consolidation, effusion, or pneumothorax. No acute osseous abnormality is evident. IMPRESSION: Stable cardiomegaly. No acute pulmonary process identified. Electronically Signed   By: Kristine Garbe M.D.   On: 06/28/2018 06:36    Procedures Procedures (including critical care time)  Medications Ordered in ED Medications  sodium chloride 0.9 % bolus 500 mL (500 mLs Intravenous New Bag/Given 06/28/18 0838)  dexamethasone (DECADRON) injection 10 mg (10 mg Intramuscular Given 06/28/18 0724)  naproxen (NAPROSYN) tablet 500 mg (500 mg Oral Given 06/28/18 0724)     Initial Impression / Assessment and Plan / ED Course  I have reviewed the triage vital signs and the nursing notes.  Pertinent labs & imaging results that were available during my care of the patient were reviewed by me and considered in my medical decision making (see chart for details).  Clinical Course as of Jun 29 847  Sat Jun 28, 2018  0830 Patient's husband at bedside.  He asked me to come and see the patient for her rectal pain.  Patient now reports that she is been having rectal pain for the last 2 or 3 weeks.  They have been applying hemorrhoidal cream without any relief.  Patient has pain with defecation. Patient did not tolerate rectal exam and declined.  She is noted to have several external hemorrhoids, and there appears to be drainage in the perirectal region.  I have concerns for perirectal abscess and will get a CT pelvis with contrast.   [AN]    Clinical Course User Index [AN] Varney Biles, MD    42 year old female comes in with chief complaint of throat pain.  Patient has history of diabetes, Cushing's syndrome  and chronic respiratory failure with tracheostomy in place.  She also according to her chart review has history of laryngeal abscess.  Patient has undergone multiple rounds of antibiotics without significant relief.  We will give her IM Decadron and chest x-ray will be ordered to ensure there is no pneumonia.  8:48 AM Patient reassessed.  She continues to have significant discomfort.  With a known history of laryngeal abscess and immunocompromised status we will get a CT soft tissue to ensure there is no new abscess formation.  Final Clinical Impressions(s) / ED Diagnoses   Final diagnoses:  Viral upper respiratory tract infection    ED Discharge Orders         Ordered    azithromycin (ZITHROMAX) 250 MG tablet  Daily,   Status:  Discontinued     06/28/18 0735    ibuprofen (ADVIL,MOTRIN) 600 MG tablet  Every 6 hours PRN     06/28/18 0737           Varney Biles, MD 06/28/18 2376    Varney Biles, MD 06/28/18 2831

## 2018-06-28 NOTE — Discharge Instructions (Addendum)
We saw you in the ER for sore throat.  We gave you an injection of medication that should help you with your pain.  Make sure that you continue to hydrate well.  Take the antibiotics only if you start developing fevers.  See your primary care doctor in 1 week.

## 2018-07-05 ENCOUNTER — Inpatient Hospital Stay (HOSPITAL_COMMUNITY)
Admission: EM | Admit: 2018-07-05 | Discharge: 2018-07-08 | DRG: 871 | Disposition: A | Discharge status: Home / Self Care | Payer: Medicaid Other | Attending: Internal Medicine | Admitting: Internal Medicine

## 2018-07-05 ENCOUNTER — Emergency Department (HOSPITAL_COMMUNITY): Payer: Medicaid Other

## 2018-07-05 ENCOUNTER — Encounter (HOSPITAL_COMMUNITY): Payer: Self-pay

## 2018-07-05 DIAGNOSIS — N179 Acute kidney failure, unspecified: Secondary | ICD-10-CM | POA: Diagnosis present

## 2018-07-05 DIAGNOSIS — Z794 Long term (current) use of insulin: Secondary | ICD-10-CM

## 2018-07-05 DIAGNOSIS — I10 Essential (primary) hypertension: Secondary | ICD-10-CM | POA: Diagnosis present

## 2018-07-05 DIAGNOSIS — M810 Age-related osteoporosis without current pathological fracture: Secondary | ICD-10-CM | POA: Diagnosis present

## 2018-07-05 DIAGNOSIS — Z96651 Presence of right artificial knee joint: Secondary | ICD-10-CM | POA: Diagnosis present

## 2018-07-05 DIAGNOSIS — Z930 Tracheostomy status: Secondary | ICD-10-CM | POA: Diagnosis not present

## 2018-07-05 DIAGNOSIS — E039 Hypothyroidism, unspecified: Secondary | ICD-10-CM | POA: Diagnosis present

## 2018-07-05 DIAGNOSIS — E1140 Type 2 diabetes mellitus with diabetic neuropathy, unspecified: Secondary | ICD-10-CM | POA: Diagnosis present

## 2018-07-05 DIAGNOSIS — F332 Major depressive disorder, recurrent severe without psychotic features: Secondary | ICD-10-CM | POA: Diagnosis present

## 2018-07-05 DIAGNOSIS — Z7984 Long term (current) use of oral hypoglycemic drugs: Secondary | ICD-10-CM

## 2018-07-05 DIAGNOSIS — E875 Hyperkalemia: Secondary | ICD-10-CM | POA: Diagnosis present

## 2018-07-05 DIAGNOSIS — N12 Tubulo-interstitial nephritis, not specified as acute or chronic: Secondary | ICD-10-CM | POA: Diagnosis present

## 2018-07-05 DIAGNOSIS — E559 Vitamin D deficiency, unspecified: Secondary | ICD-10-CM | POA: Diagnosis present

## 2018-07-05 DIAGNOSIS — E249 Cushing's syndrome, unspecified: Secondary | ICD-10-CM | POA: Diagnosis present

## 2018-07-05 DIAGNOSIS — T7840XA Allergy, unspecified, initial encounter: Secondary | ICD-10-CM | POA: Diagnosis present

## 2018-07-05 DIAGNOSIS — N390 Urinary tract infection, site not specified: Secondary | ICD-10-CM | POA: Diagnosis present

## 2018-07-05 DIAGNOSIS — A419 Sepsis, unspecified organism: Principal | ICD-10-CM

## 2018-07-05 DIAGNOSIS — G4730 Sleep apnea, unspecified: Secondary | ICD-10-CM | POA: Diagnosis present

## 2018-07-05 DIAGNOSIS — Z9049 Acquired absence of other specified parts of digestive tract: Secondary | ICD-10-CM

## 2018-07-05 DIAGNOSIS — Z881 Allergy status to other antibiotic agents status: Secondary | ICD-10-CM | POA: Diagnosis not present

## 2018-07-05 DIAGNOSIS — Z23 Encounter for immunization: Secondary | ICD-10-CM

## 2018-07-05 DIAGNOSIS — T368X5A Adverse effect of other systemic antibiotics, initial encounter: Secondary | ICD-10-CM | POA: Diagnosis not present

## 2018-07-05 DIAGNOSIS — E785 Hyperlipidemia, unspecified: Secondary | ICD-10-CM | POA: Diagnosis present

## 2018-07-05 DIAGNOSIS — R6521 Severe sepsis with septic shock: Secondary | ICD-10-CM | POA: Diagnosis present

## 2018-07-05 DIAGNOSIS — IMO0002 Reserved for concepts with insufficient information to code with codable children: Secondary | ICD-10-CM

## 2018-07-05 DIAGNOSIS — Z880 Allergy status to penicillin: Secondary | ICD-10-CM

## 2018-07-05 DIAGNOSIS — Z87891 Personal history of nicotine dependence: Secondary | ICD-10-CM | POA: Diagnosis not present

## 2018-07-05 DIAGNOSIS — Z7951 Long term (current) use of inhaled steroids: Secondary | ICD-10-CM | POA: Diagnosis not present

## 2018-07-05 DIAGNOSIS — E1165 Type 2 diabetes mellitus with hyperglycemia: Secondary | ICD-10-CM

## 2018-07-05 DIAGNOSIS — E1169 Type 2 diabetes mellitus with other specified complication: Secondary | ICD-10-CM | POA: Diagnosis present

## 2018-07-05 DIAGNOSIS — E1159 Type 2 diabetes mellitus with other circulatory complications: Secondary | ICD-10-CM | POA: Diagnosis present

## 2018-07-05 LAB — COMPREHENSIVE METABOLIC PANEL
ALT: 15 U/L (ref 0–44)
ANION GAP: 10 (ref 5–15)
AST: 15 U/L (ref 15–41)
Albumin: 3.2 g/dL — ABNORMAL LOW (ref 3.5–5.0)
Alkaline Phosphatase: 65 U/L (ref 38–126)
BUN: 19 mg/dL (ref 6–20)
CALCIUM: 9.2 mg/dL (ref 8.9–10.3)
CHLORIDE: 104 mmol/L (ref 98–111)
CO2: 21 mmol/L — AB (ref 22–32)
CREATININE: 2.74 mg/dL — AB (ref 0.44–1.00)
GFR calc Af Amer: 23 mL/min — ABNORMAL LOW (ref >60)
GFR, EST NON AFRICAN AMERICAN: 20 mL/min — AB (ref >60)
Glucose, Bld: 193 mg/dL — ABNORMAL HIGH (ref 70–99)
Potassium: 5 mmol/L (ref 3.5–5.1)
SODIUM: 135 mmol/L (ref 135–145)
Total Bilirubin: 0.9 mg/dL (ref 0.3–1.2)
Total Protein: 6.6 g/dL (ref 6.5–8.1)

## 2018-07-05 LAB — CBC WITH DIFFERENTIAL/PLATELET
Abs Immature Granulocytes: 0.04 10*3/uL (ref 0.00–0.07)
Basophils Absolute: 0 10*3/uL (ref 0.0–0.1)
Basophils Relative: 0 %
EOS ABS: 0.1 10*3/uL (ref 0.0–0.5)
EOS PCT: 1 %
HEMATOCRIT: 40.3 % (ref 36.0–46.0)
Hemoglobin: 11.8 g/dL — ABNORMAL LOW (ref 12.0–15.0)
IMMATURE GRANULOCYTES: 0 %
LYMPHS ABS: 2 10*3/uL (ref 0.7–4.0)
LYMPHS PCT: 21 %
MCH: 26.8 pg (ref 26.0–34.0)
MCHC: 29.3 g/dL — AB (ref 30.0–36.0)
MCV: 91.4 fL (ref 80.0–100.0)
Monocytes Absolute: 0.5 10*3/uL (ref 0.1–1.0)
Monocytes Relative: 5 %
NEUTROS PCT: 73 %
NRBC: 0 % (ref 0.0–0.2)
Neutro Abs: 6.7 10*3/uL (ref 1.7–7.7)
Platelets: 497 10*3/uL — ABNORMAL HIGH (ref 150–400)
RBC: 4.41 MIL/uL (ref 3.87–5.11)
RDW: 13.1 % (ref 11.5–15.5)
WBC: 9.3 10*3/uL (ref 4.0–10.5)

## 2018-07-05 LAB — I-STAT CG4 LACTIC ACID, ED
LACTIC ACID, VENOUS: 1.69 mmol/L (ref 0.5–1.9)
Lactic Acid, Venous: 2.78 mmol/L (ref 0.5–1.9)

## 2018-07-05 LAB — URINALYSIS, ROUTINE W REFLEX MICROSCOPIC
BILIRUBIN URINE: NEGATIVE
GLUCOSE, UA: NEGATIVE mg/dL
HGB URINE DIPSTICK: NEGATIVE
Ketones, ur: NEGATIVE mg/dL
NITRITE: NEGATIVE
PH: 5 (ref 5.0–8.0)
Protein, ur: 30 mg/dL — AB
SPECIFIC GRAVITY, URINE: 1.017 (ref 1.005–1.030)
WBC, UA: >50 WBC/hpf — ABNORMAL HIGH (ref 0–5)

## 2018-07-05 LAB — I-STAT BETA HCG BLOOD, ED (MC, WL, AP ONLY): I-stat hCG, quantitative: <5 m[IU]/mL (ref <5)

## 2018-07-05 LAB — CBG MONITORING, ED: Glucose-Capillary: 162 mg/dL — ABNORMAL HIGH (ref 70–99)

## 2018-07-05 LAB — GLUCOSE, CAPILLARY: GLUCOSE-CAPILLARY: 267 mg/dL — AB (ref 70–99)

## 2018-07-05 LAB — MRSA PCR SCREENING: MRSA BY PCR: NEGATIVE

## 2018-07-05 MED ORDER — DIPHENHYDRAMINE HCL 50 MG/ML IJ SOLN: 25.0000 mg | Freq: Four times a day (QID) | INTRAMUSCULAR | Status: DC | PRN

## 2018-07-05 MED ORDER — SODIUM CHLORIDE 0.9 % IV BOLUS (SEPSIS)
1000.0000 mL | Freq: Once | INTRAVENOUS | Status: AC
  Administered 2018-07-05: 1000 mL via INTRAVENOUS

## 2018-07-05 MED ORDER — PIOGLITAZONE HCL 15 MG PO TABS
15.0000 mg | ORAL_TABLET | Freq: Every day | ORAL | Status: DC
  Administered 2018-07-05 – 2018-07-08 (×4): 15 mg via ORAL
  Filled 2018-07-05 (×4): qty 1

## 2018-07-05 MED ORDER — METOPROLOL TARTRATE 12.5 MG HALF TABLET
12.5000 mg | ORAL_TABLET | Freq: Two times a day (BID) | ORAL | Status: DC
  Administered 2018-07-05 – 2018-07-08 (×6): 12.5 mg via ORAL
  Filled 2018-07-05 (×6): qty 1

## 2018-07-05 MED ORDER — ACETAMINOPHEN 650 MG RE SUPP: 650.0000 mg | Freq: Four times a day (QID) | RECTAL | Status: DC | PRN

## 2018-07-05 MED ORDER — HEPARIN SODIUM (PORCINE) 5000 UNIT/ML IJ SOLN
5000.0000 [IU] | Freq: Three times a day (TID) | INTRAMUSCULAR | Status: DC
  Administered 2018-07-05 – 2018-07-08 (×9): 5000 [IU] via SUBCUTANEOUS
  Filled 2018-07-05 (×8): qty 1

## 2018-07-05 MED ORDER — LEVOTHYROXINE SODIUM 75 MCG PO TABS
75.0000 ug | ORAL_TABLET | Freq: Every day | ORAL | Status: DC
  Administered 2018-07-06 – 2018-07-08 (×3): 75 ug via ORAL
  Filled 2018-07-05 (×3): qty 1

## 2018-07-05 MED ORDER — BUPROPION HCL ER (SR) 100 MG PO TB12
100.0000 mg | ORAL_TABLET | Freq: Two times a day (BID) | ORAL | Status: DC
  Administered 2018-07-05 – 2018-07-08 (×6): 100 mg via ORAL
  Filled 2018-07-05 (×8): qty 1

## 2018-07-05 MED ORDER — GABAPENTIN 600 MG PO TABS
600.0000 mg | ORAL_TABLET | Freq: Two times a day (BID) | ORAL | Status: DC
  Administered 2018-07-05 – 2018-07-08 (×6): 600 mg via ORAL
  Filled 2018-07-05 (×6): qty 1

## 2018-07-05 MED ORDER — INSULIN DETEMIR 100 UNIT/ML ~~LOC~~ SOLN
50.0000 [IU] | Freq: Two times a day (BID) | SUBCUTANEOUS | Status: DC
  Administered 2018-07-05 – 2018-07-06 (×3): 50 [IU] via SUBCUTANEOUS
  Filled 2018-07-05 (×4): qty 0.5

## 2018-07-05 MED ORDER — ONDANSETRON HCL 4 MG PO TABS: 4.0000 mg | ORAL_TABLET | Freq: Four times a day (QID) | ORAL | Status: DC | PRN

## 2018-07-05 MED ORDER — VENLAFAXINE HCL ER 75 MG PO CP24
75.0000 mg | ORAL_CAPSULE | Freq: Every day | ORAL | Status: DC
  Administered 2018-07-05 – 2018-07-08 (×4): 75 mg via ORAL
  Filled 2018-07-05 (×4): qty 1

## 2018-07-05 MED ORDER — SODIUM CHLORIDE 0.9 % IV SOLN
2.0000 g | Freq: Once | INTRAVENOUS | Status: AC
  Administered 2018-07-05: 2 g via INTRAVENOUS
  Filled 2018-07-05: qty 2

## 2018-07-05 MED ORDER — TRAZODONE HCL 50 MG PO TABS
50.0000 mg | ORAL_TABLET | Freq: Every day | ORAL | Status: DC
  Administered 2018-07-05 – 2018-07-07 (×3): 50 mg via ORAL
  Filled 2018-07-05 (×3): qty 1

## 2018-07-05 MED ORDER — EPINEPHRINE 0.3 MG/0.3ML IJ SOAJ
0.3000 mg | Freq: Once | INTRAMUSCULAR | Status: AC
  Administered 2018-07-05: 0.3 mg via INTRAMUSCULAR
  Filled 2018-07-05: qty 0.3

## 2018-07-05 MED ORDER — SODIUM CHLORIDE 0.9 % IV SOLN
1.0000 g | Freq: Three times a day (TID) | INTRAVENOUS | Status: DC
  Administered 2018-07-05 – 2018-07-06 (×3): 1 g via INTRAVENOUS
  Filled 2018-07-05 (×4): qty 1

## 2018-07-05 MED ORDER — METHYLPREDNISOLONE SODIUM SUCC 125 MG IJ SOLR
125.0000 mg | Freq: Once | INTRAMUSCULAR | Status: AC
  Administered 2018-07-05: 125 mg via INTRAVENOUS
  Filled 2018-07-05: qty 2

## 2018-07-05 MED ORDER — DIPHENHYDRAMINE HCL 50 MG/ML IJ SOLN
25.0000 mg | Freq: Once | INTRAMUSCULAR | Status: AC
  Administered 2018-07-05: 25 mg via INTRAVENOUS
  Filled 2018-07-05: qty 1

## 2018-07-05 MED ORDER — ONDANSETRON HCL 4 MG/2ML IJ SOLN: 4.0000 mg | Freq: Four times a day (QID) | INTRAMUSCULAR | Status: DC | PRN

## 2018-07-05 MED ORDER — ATORVASTATIN CALCIUM 20 MG PO TABS
20.0000 mg | ORAL_TABLET | Freq: Every day | ORAL | Status: DC
  Administered 2018-07-05 – 2018-07-07 (×3): 20 mg via ORAL
  Filled 2018-07-05 (×2): qty 1

## 2018-07-05 MED ORDER — SODIUM CHLORIDE 0.9 % IV SOLN
INTRAVENOUS | Status: DC
  Administered 2018-07-05 – 2018-07-06 (×2): via INTRAVENOUS

## 2018-07-05 MED ORDER — FAMOTIDINE IN NACL 20-0.9 MG/50ML-% IV SOLN
20.0000 mg | Freq: Two times a day (BID) | INTRAVENOUS | Status: DC
  Administered 2018-07-05 – 2018-07-07 (×5): 20 mg via INTRAVENOUS
  Filled 2018-07-05 (×6): qty 50

## 2018-07-05 MED ORDER — ACETAMINOPHEN 325 MG PO TABS
650.0000 mg | ORAL_TABLET | Freq: Four times a day (QID) | ORAL | Status: DC | PRN
  Administered 2018-07-06: 650 mg via ORAL
  Filled 2018-07-05: qty 2

## 2018-07-05 MED ORDER — FENTANYL CITRATE (PF) 100 MCG/2ML IJ SOLN
50.0000 ug | Freq: Once | INTRAMUSCULAR | Status: AC
  Administered 2018-07-05: 50 ug via INTRAVENOUS
  Filled 2018-07-05: qty 2

## 2018-07-05 MED ORDER — PANTOPRAZOLE SODIUM 40 MG PO TBEC
40.0000 mg | DELAYED_RELEASE_TABLET | Freq: Every day | ORAL | Status: DC
  Administered 2018-07-05 – 2018-07-08 (×4): 40 mg via ORAL
  Filled 2018-07-05 (×4): qty 1

## 2018-07-05 MED ORDER — INSULIN ASPART 100 UNIT/ML ~~LOC~~ SOLN
0.0000 [IU] | Freq: Three times a day (TID) | SUBCUTANEOUS | Status: DC
  Administered 2018-07-05: 4 [IU] via SUBCUTANEOUS
  Administered 2018-07-06: 7 [IU] via SUBCUTANEOUS
  Administered 2018-07-06 (×2): 4 [IU] via SUBCUTANEOUS
  Administered 2018-07-07 – 2018-07-08 (×3): 3 [IU] via SUBCUTANEOUS
  Filled 2018-07-05: qty 1

## 2018-07-05 NOTE — Progress Notes (Signed)
Checklist at the head of the bed, nurse notified and spare trach on order currently and awaiting delivery. Patient in no distress at this time.

## 2018-07-05 NOTE — ED Triage Notes (Signed)
Pt presents for evaluation of UTI with worsening pain x 2-3 days. Reports saw PCP yesterday, given bactrim and flomax, now has itchy rash. Pt is hypotensive and tachycardic on arrival.

## 2018-07-05 NOTE — ED Provider Notes (Signed)
[facility] EMERGENCY DEPARTMENT Provider Note   CSN: [id number] Arrival date & time: 07/05/18  1213     History   Chief Complaint Chief Complaint  Patient presents with  . Urinary Tract Infection    HPI [name] is a 42 y.o. female with history of hypertension, hyperlipidemia, diabetes, Cushing's disease who presents with a 3-day history of urgency and dysuria.  Patient was seen by PCP yesterday and started on Bactrim and Flomax.  Patient broke out in a full body, itchy rash today.  She denies any chest pain or new shortness of breath.  She denies any abdominal pain.  She has had severe left flank pain and mild right flank pain.  She has not taken any other new medications at home.  HPI  Past Medical History:  Diagnosis Date  . Anxiety   . Complication of anesthesia    Pt. states takes long time to wake up from it.   . Cushing's disease ([place])   . Depression   . Diabetes ([place])   . Hyperlipidemia   . Hyperlipidemia   . Hypertension   . Morbid obesity ([place])   . Osteoporosis 07/19/2015  . Periprosthetic fracture 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[place]), R tibial plateau  07/18/2015  . Sleep apnea   . Uncontrolled diabetes mellitus with diabetic neuropathy, with long-term current use of insulin ([place]) 07/16/2015  . Vitamin D deficiency 07/19/2015    Patient Active Problem List   Diagnosis Date Noted  . Tracheostomy complication ([place])   . 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 Obstructive sleep apnea syndrome   . MDD (major depressive disorder), recurrent episode, moderate ([place])   . Acute respiratory failure with hypoxemia ([place])   . SOB (shortness of breath)   . Tracheostomy status ([place])   . Hoarse voice quality   . Hypomagnesemia   . Shortness of breath   . Major depressive disorder, recurrent episode, severe ([place])   . Intentional overdose of drug in tablet form ([place]) 11/20/2017  . Intentional drug overdose ([place])   . Seizure ([place])   . Acute respiratory  failure with hypercapnia ([place])   . Status epilepticus ([place])   . Spondylosis without myelopathy or radiculopathy, cervical region 10/29/2017  . Abdominal pain, epigastric   . LUQ abdominal pain   . Nausea and vomiting   . Gastroparesis   . Hypertriglyceridemia 07/22/2015  . Diabetes mellitus due to underlying condition, uncontrolled, with diabetic neuropathy, with long-term current use of insulin ([place])   . Vitamin D deficiency 07/19/2015  . 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 Osteoporosis 07/19/2015  . Periprosthetic fracture 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[place]), R tibial plateau  07/18/2015  . Fracture, tibial plateau 07/16/2015  . Uncontrolled diabetes mellitus with diabetic neuropathy, with long-term current use of insulin ([place]) 07/16/2015  . Leukocytosis 07/16/2015  . Essential hypertension 07/16/2015  . Morbid obesity due to excess calories ([place]) 04/20/2014  . Hyperlipidemia 07/31/2012  . Cushing disease ([place]) 07/31/2012  . Hypothyroid 07/31/2012    Past Surgical History:  Procedure Laterality Date  . ANTERIOR TALOFIBULAR LIGAMENT REPAIR Left 11/15/2014   Procedure: ANTERIOR TALOFIBULAR LIGAMENT REPAIR;  Surgeon: Jana Half, DPM;  Location: [facility];  Service: Podiatry;  Laterality: Left;  . CESAREAN SECTION  dec 1997/  06-03-2001/   01-01-2005   BILATERAL TUBAL LIGATION WITH LAST ONE  . DILATION AND CURETTAGE OF UTERUS  1995   WITH SUCTION  . DIRECT  LARYNGOSCOPY N/A 12/18/2017   Procedure: DIRECT LARYNGOSCOPY;  Surgeon: Izora Gala, MD;  Location: [facility];  Service: ENT;  Laterality: N/A;  . ESOPHAGOGASTRODUODENOSCOPY (EGD) WITH PROPOFOL N/A 10/04/2016   Procedure: ESOPHAGOGASTRODUODENOSCOPY (EGD) WITH PROPOFOL;  Surgeon: [name], MD;  Location[facility];  Service: Endoscopy;  Laterality: N/A;  . LAPAROSCOPIC CHOLECYSTECTOMY  09-25-2005  . ORIF TIBIA PLATEAU Right 07/19/2015   Procedure: OPEN  REDUCTION INTERNAL FIXATION (ORIF) RIGHT TIBIAL PLATEAU;  Surgeon: Altamese Heeney, MD;  Location: [facility];  Service: Orthopedics;  Laterality: Right;  . PARTIAL KNEE ARTHROPLASTY Right 04/19/2014   Procedure: RIGHT UNI KNEE ARTHROPLASTY MEDIALLY ;  Surgeon: Mauri Pole, MD;  Location: [facility];  Service: Orthopedics;  Laterality: Right;  . TRACHEOSTOMY TUBE PLACEMENT N/A 11/28/2017   Procedure: TRACHEOSTOMY;  Surgeon: Izora Gala, MD;  Location: [facility];  Service: ENT;  Laterality: N/A;  . TUBAL LIGATION       OB History   None      Home Medications    Prior to Admission medications   Medication Sig Start Date End Date Taking? Authorizing Provider  atorvastatin (LIPITOR) 20 MG tablet Take 20 mg by mouth at bedtime.   Yes [provider]  buPROPion (WELLBUTRIN SR) 100 MG 12 hr tablet Take 100 mg by mouth 2 (two) times daily.   Yes [provider]  fluticasone (FLONASE) 50 MCG/ACT nasal spray Place 2 sprays into both nostrils daily. 04/14/18 04/14/19 Yes Erick Colace, NP  gabapentin (NEURONTIN) 600 MG tablet Take 600 mg by mouth 2 (two) times daily.    Yes [provider]  ibuprofen (ADVIL,MOTRIN) 600 MG tablet Take 1 tablet (600 mg total) by mouth every 6 (six) hours as needed. 06/28/18  Yes Varney Biles, MD  Insulin Detemir (LEVEMIR FLEXTOUCH) 100 UNIT/ML Pen Inject 50 Units into the skin 2 (two) times daily. 06/09/18  Yes [provider]  levothyroxine (SYNTHROID, LEVOTHROID) 75 MCG tablet Take 1 tablet (75 mcg total) by mouth daily before breakfast. 01/07/18  Yes Nita Sells, MD  lisinopril (PRINIVIL,ZESTRIL) 2.5 MG tablet Take 2.5 mg by mouth daily.   Yes [provider]  metFORMIN (GLUCOPHAGE) 1000 MG tablet Take 1,000 mg by mouth 2 (two) times daily with a meal.   Yes [provider]  metoprolol tartrate (LOPRESSOR) 25 MG tablet Take 0.5 tablets (12.5 mg total) by mouth 2 (two) times daily. 01/06/18  Yes Nita Sells,  MD  pantoprazole (PROTONIX) 40 MG tablet TAKE ONE TABLET BY MOUTH TWICE A DAY BEFORE MEALS Patient taking differently: Take 40 mg by mouth daily.  04/01/17  Yes [name], MD  pioglitazone (ACTOS) 15 MG tablet Take 15 mg by mouth daily.   Yes [provider]  quinapril (ACCUPRIL) 5 MG tablet Take 2.5 mg by mouth every morning.    Yes [provider]  sulfamethoxazole-trimethoprim (BACTRIM DS,SEPTRA DS) 800-160 MG tablet Take 1 tablet by mouth 2 (two) times daily.   Yes [provider]  tamsulosin (FLOMAX) 0.4 MG CAPS capsule Take 0.4 mg by mouth daily.   Yes [provider]  traZODone (DESYREL) 50 MG tablet Take 50 mg by mouth at bedtime.   Yes [provider]  venlafaxine XR (EFFEXOR-XR) 75 MG 24 hr capsule Take 75 mg by mouth daily.   Yes [provider]  cholestyramine (QUESTRAN) 4 g packet Take 1 packet (4 g total) by mouth every other day. Patient not taking: Reported on 07/05/2018 01/08/18   Samtani,  Jai-Gurmukh, MD  clindamycin (CLEOCIN) 300 MG capsule Take 1 capsule (300 mg total) by mouth 3 (three) times daily. Patient not taking: Reported on 07/05/2018 06/28/18   Jola Schmidt, MD  naproxen (NAPROSYN) 500 MG tablet Take 1 tablet (500 mg total) by mouth 2 (two) times daily. Patient not taking: Reported on 07/05/2018 10/31/16   Tanna Furry, MD    Family History Family History  Adopted: Yes  Problem Relation Age of Onset  . Autism Son   . ADD / ADHD Son   . Apraxia Son     Social History Social History   Tobacco Use  . Smoking status: Former Smoker    Years: 4.00    Last attempt to quit: 05/22/2017    Years since quitting: 1.1  . Smokeless tobacco: Never Used  Substance Use Topics  . Alcohol use: Yes    Comment: RARE  . Drug use: No    Types: Marijuana     Allergies   Bactrim [sulfamethoxazole-trimethoprim] and Penicillins   Review of Systems Review of Systems  Constitutional: Negative for chills and  fever.  HENT: Negative for facial swelling and sore throat.   Respiratory: Negative for shortness of breath.   Cardiovascular: Negative for chest pain.  Gastrointestinal: Negative for abdominal pain, nausea and vomiting.  Genitourinary: Positive for dysuria, flank pain and urgency.  Musculoskeletal: Negative for back pain.  Skin: Positive for rash. Negative for wound.  Neurological: Negative for headaches.  Psychiatric/Behavioral: The patient is not nervous/anxious.      Physical Exam Updated Vital Signs BP 135/77   Pulse 99   Temp 97.9 F (36.6 C) (Oral)   Resp 16   SpO2 96%   Physical Exam  Constitutional: She appears well-developed and well-nourished. No distress.  Obese  HENT:  Head: Normocephalic and atraumatic.  Mouth/Throat: Oropharynx is clear and moist. No oropharyngeal exudate.  Eyes: Pupils are equal, round, and reactive to light. Conjunctivae are normal. Right eye exhibits no discharge. Left eye exhibits no discharge. No scleral icterus.  Neck: Normal range of motion. Neck supple. No thyromegaly present.  Cardiovascular: Normal rate, regular rhythm, normal heart sounds and intact distal pulses. Exam reveals no gallop and no friction rub.  No murmur heard. Pulmonary/Chest: Effort normal and breath sounds normal. No stridor. No respiratory distress. She has no wheezes. She has no rales.  Abdominal: Soft. Bowel sounds are normal. She exhibits no distension. There is no tenderness. There is CVA tenderness (L>R). There is no rebound and no guarding.  Musculoskeletal: She exhibits no edema.  Lymphadenopathy:    She has no cervical adenopathy.  Neurological: She is alert. Coordination normal.  Skin: Skin is warm and dry. No rash noted. She is not diaphoretic. No pallor.  Diffuse reticular rash, nontender  Psychiatric: She has a normal mood and affect.  Nursing note and vitals reviewed.    ED Treatments / Results  Labs (all labs ordered are listed, but only abnormal  results are displayed) Labs Reviewed  COMPREHENSIVE METABOLIC PANEL - Abnormal; Notable for the following components:      Result Value   CO2 21 (*)    Glucose, Bld 193 (*)    Creatinine, Ser 2.74 (*)    Albumin 3.2 (*)    GFR calc non Af Amer 20 (*)    GFR calc Af Amer 23 (*)    All other components within normal limits  CBC WITH DIFFERENTIAL/PLATELET - Abnormal; Notable for the following components:   Hemoglobin 11.8 (*)  MCHC 29.3 (*)    Platelets 497 (*)    All other components within normal limits  URINALYSIS, ROUTINE W REFLEX MICROSCOPIC - Abnormal; Notable for the following components:   Color, Urine AMBER (*)    APPearance CLOUDY (*)    Protein, ur 30 (*)    Leukocytes, UA LARGE (*)    WBC, UA >50 (*)    Bacteria, UA MANY (*)    All other components within normal limits  I-STAT CG4 LACTIC ACID, ED - Abnormal; Notable for the following components:   Lactic Acid, Venous 2.78 (*)    All other components within normal limits  CBG MONITORING, ED - Abnormal; Notable for the following components:   Glucose-Capillary 162 (*)    All other components within normal limits  CULTURE, BLOOD (ROUTINE X 2)  CULTURE, BLOOD (ROUTINE X 2)  URINE CULTURE  I-STAT BETA HCG BLOOD, ED ([facility], [facility], [facility] ONLY)  I-STAT CG4 LACTIC ACID, ED    EKG None  Radiology Dg Chest Port 1 View  Result Date: 07/05/2018 CLINICAL DATA:  Tachycardia and hypotensive. EXAM: PORTABLE CHEST 1 VIEW COMPARISON:  06/28/2018 FINDINGS: The cardiac silhouette, mediastinal and hilar contours are within normal limits and stable. Lungs are clear. The tracheostomy tube is in good position. The bony thorax is intact. IMPRESSION: No acute cardiopulmonary findings. Electronically Signed   By: Marijo Sanes M.D.   On: 07/05/2018 13:33    Procedures Procedures (including critical care time)  CRITICAL CARE Performed by: Frederica Kuster   Total critical care time: 45 minutes  Critical care time was exclusive of  separately billable procedures and treating other patients.  Critical care was necessary to treat or prevent imminent or life-threatening deterioration.  Critical care was time spent personally by me on the following activities: development of treatment plan with patient and/or surrogate as well as nursing, discussions with consultants, evaluation of patient's response to treatment, examination of patient, obtaining history from patient or surrogate, ordering and performing treatments and interventions, ordering and review of laboratory studies, ordering and review of radiographic studies, pulse oximetry and re-evaluation of patient's condition.   Medications Ordered in ED Medications  aztreonam (AZACTAM) 1 g in sodium chloride 0.9 % 100 mL IVPB (has no administration in time range)  insulin aspart (novoLOG) injection 0-20 Units (has no administration in time range)  methylPREDNISolone sodium succinate (SOLU-MEDROL) 125 mg/2 mL injection 125 mg (125 mg Intravenous Given 07/05/18 1335)  EPINEPHrine (EPI-PEN) injection 0.3 mg (0.3 mg Intramuscular Given 07/05/18 1315)  diphenhydrAMINE (BENADRYL) injection 25 mg (25 mg Intravenous Given 07/05/18 1334)  aztreonam (AZACTAM) 2 g in sodium chloride 0.9 % 100 mL IVPB (0 g Intravenous Stopped 07/05/18 1423)  sodium chloride 0.9 % bolus 1,000 mL (0 mLs Intravenous Stopped 07/05/18 1514)    And  sodium chloride 0.9 % bolus 1,000 mL (0 mLs Intravenous Stopped 07/05/18 1514)    And  sodium chloride 0.9 % bolus 1,000 mL (1,000 mLs Intravenous New Bag/Given 07/05/18 1430)    And  sodium chloride 0.9 % bolus 1,000 mL (1,000 mLs Intravenous New Bag/Given 07/05/18 1500)     Initial Impression / Assessment and Plan / ED Course  I have reviewed the triage vital signs and the nursing notes.  Pertinent labs & imaging results that were available during my care of the patient were reviewed by me and considered in my medical decision making (see chart for  details).     Patient with severe sepsis with urinary origin.  Code sepsis called.  Fluids and antibiotics initiated.  Blood cultures and urine cultures sent and pending.  Patient also with an AKI, creatinine 2.74. Lactic initially 2.78, repeat 1.69 after fluids. Patient given epinephrine for her rash which completely resolved.  Suspect this is a drug reaction, most likely Bactrim, although Flomax is a possibility considering both were started at the same time.  UA shows large leukocytes, greater than 50 WBCs, many bacteria.  Chest x-ray shows no acute findings.  I spoke with Dr. Olevia Bowens with [facility] who accepts patient for admission.  I appreciate his assistance with the also evaluated by my attending, Dr. Sabra Heck, who guided the patient's management and agrees with plan.  Final Clinical Impressions(s) / ED Diagnoses   Final diagnoses:  Sepsis with acute renal failure and septic shock, due to unspecified organism, unspecified acute renal failure type [facility])    ED Discharge Orders    None       Frederica Kuster, PA-C 07/05/18 1636    Noemi Chapel, MD 07/07/18 1225

## 2018-07-05 NOTE — ED Notes (Signed)
Notified Dr. Sabra Heck of pt. Lactic Acid 2.78

## 2018-07-05 NOTE — H&P (Signed)
History and Physical    [name] ZOX:[id number] DOB: 1975-12-15 DOA: 07/05/2018  PCP: Sandi Mariscal, MD   Patient coming from: Home.  I have personally briefly reviewed patient's old medical records in [place]  Chief Complaint: Evaluation of a UTI.  HPI: [name] is a 42 y.o. female with medical history significant of anxiety, Cushing's disease, depression, type 2 diabetes, hyperlipidemia, hypertension, morbid obesity, sleep apnea, uncontrolled type 2 diabetes, vitamin D deficiency who is brought to the emergency department due to progressively worse UTI with dysuria, frequency and urgency, but no hematuria for the past 2 to 3 days despite taking Bactrim and Flomax yesterday.  The patient also stated that she developed an urticarial rash after taking Bactrim and Flomax.  She denies fever, complains of chills, decreased appetite, nausea, dizziness, fatigue and malaise.  She denies sore throat, dyspnea, wheezing, chest pain, diaphoresis, PND, orthopnea or recent pitting edema of the lower extremities.  No polyuria, polydipsia, polyphagia or blurred vision.  She states that she is heat intolerant.  ED Course: Initial vital signs temperature 97.9 F, pulse 114, respirations 16, blood pressure 87/54 mmHg and O2 sat 98% on room air.  Patient received 4000 mL of NS bolus plus aspirin and 1 g IVPB in the emergency department.  She was also treated for an allergy reaction with IV Benadryl and Solu-Medrol, most likely with this was due to Bactrim.  Her urinalysis showed large leukocyte esterase, many bacteria and more than 50 WBC per hpf.  White count is 9.3, hemoglobin 11.8 and platelets 497.  CMP shows normal electrolytes, except for mildly decreased CO2 of 21 mmol/L.  Creatinine was 2.74, BUN 19 and glucose 193 mg/dL.  Albumin was 3.2 g/dL, all other hepatic function tests were within normal limits.  I-STAT hCG was normal.  Lactic acid initially elevated at 2.78 but normalized at 1.69  mmol/L.  Her chest radiograph does not show any acute cardiopulmonary pathology.  Review of Systems: As per HPI otherwise 10 point review of systems negative.   Past Medical History:  Diagnosis Date  . Anxiety   . Complication of anesthesia    Pt. states takes long time to wake up from it.   . Cushing's disease ([place])   . Depression   . Diabetes ([place])   . Hyperlipidemia   . Hyperlipidemia   . Hypertension   . Morbid obesity ([place])   . Osteoporosis 07/19/2015  . Periprosthetic fracture 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[place]), R tibial plateau  07/18/2015  . Sleep apnea   . Uncontrolled diabetes mellitus with diabetic neuropathy, with long-term current use of insulin ([place]) 07/16/2015  . Vitamin D deficiency 07/19/2015    Past Surgical History:  Procedure Laterality Date  . ANTERIOR TALOFIBULAR LIGAMENT REPAIR Left 11/15/2014   Procedure: ANTERIOR TALOFIBULAR LIGAMENT REPAIR;  Surgeon: Jana Half, DPM;  Location: [place];  Service: Podiatry;  Laterality: Left;  . CESAREAN SECTION  dec 1997/  06-03-2001/   01-01-2005   BILATERAL TUBAL LIGATION WITH LAST ONE  . DILATION AND CURETTAGE OF UTERUS  1995   WITH SUCTION  . DIRECT LARYNGOSCOPY N/A 12/18/2017   Procedure: DIRECT LARYNGOSCOPY;  Surgeon: Izora Gala, MD;  Location: [facility];  Service: ENT;  Laterality: N/A;  . ESOPHAGOGASTRODUODENOSCOPY (EGD) WITH PROPOFOL N/A 10/04/2016   Procedure: ESOPHAGOGASTRODUODENOSCOPY (EGD) WITH PROPOFOL;  Surgeon: [name], MD;  Location: [facility];  Service: Endoscopy;  Laterality: N/A;  . LAPAROSCOPIC CHOLECYSTECTOMY  09-25-2005  .  ORIF TIBIA PLATEAU Right 07/19/2015   Procedure: OPEN REDUCTION INTERNAL FIXATION (ORIF) RIGHT TIBIAL PLATEAU;  Surgeon: Altamese [place], MD;  Location: [facility];  Service: Orthopedics;  Laterality: Right;  . PARTIAL KNEE ARTHROPLASTY Right 04/19/2014   Procedure: RIGHT UNI KNEE ARTHROPLASTY MEDIALLY ;  Surgeon: Mauri Pole, MD;  Location: [facility];   Service: Orthopedics;  Laterality: Right;  . TRACHEOSTOMY TUBE PLACEMENT N/A 11/28/2017   Procedure: TRACHEOSTOMY;  Surgeon: Izora Gala, MD;  Location: [facility];  Service: ENT;  Laterality: N/A;  . TUBAL LIGATION       reports that she quit smoking about 13 months ago. She quit after 4.00 years of use. She has never used smokeless tobacco. She reports that she drinks alcohol. She reports that she does not use drugs.  Allergies  Allergen Reactions  . Bactrim [Sulfamethoxazole-Trimethoprim] Rash  . Penicillins Hives and Other (See Comments)    PATIENT HAS HAD A PCN REACTION WITH IMMEDIATE RASH, FACIAL/TONGUE/THROAT SWELLING, SOB, OR LIGHTHEADEDNESS WITH HYPOTENSION:   YES ** see below HAS PT DEVELOPED SEVERE RASH INVOLVING MUCUS MEMBRANES or SKIN NECROSIS: YES ** see below Has patient had a PCN reaction that required hospitalization No Has patient had a PCN reaction occurring within the last 10 years: No  **TOLERATED ZOSYN 12/12/17    Family History  Adopted: Yes  Problem Relation Age of Onset  . Autism Son   . ADD / ADHD Son   . Apraxia Son    Prior to Admission medications   Medication Sig Start Date End Date Taking? Authorizing Provider  atorvastatin (LIPITOR) 20 MG tablet Take 20 mg by mouth at bedtime.   Yes [provider]  buPROPion (WELLBUTRIN SR) 100 MG 12 hr tablet Take 100 mg by mouth 2 (two) times daily.   Yes [provider]  fluticasone (FLONASE) 50 MCG/ACT nasal spray Place 2 sprays into both nostrils daily. 04/14/18 04/14/19 Yes Erick Colace, NP  gabapentin (NEURONTIN) 600 MG tablet Take 600 mg by mouth 2 (two) times daily.    Yes [provider]  ibuprofen (ADVIL,MOTRIN) 600 MG tablet Take 1 tablet (600 mg total) by mouth every 6 (six) hours as needed. 06/28/18  Yes Varney Biles, MD  Insulin Detemir (LEVEMIR FLEXTOUCH) 100 UNIT/ML Pen Inject 50 Units into the skin 2 (two) times daily. 06/09/18  Yes [provider]  levothyroxine  (SYNTHROID, LEVOTHROID) 75 MCG tablet Take 1 tablet (75 mcg total) by mouth daily before breakfast. 01/07/18  Yes Nita Sells, MD  lisinopril (PRINIVIL,ZESTRIL) 2.5 MG tablet Take 2.5 mg by mouth daily.   Yes [provider]  metFORMIN (GLUCOPHAGE) 1000 MG tablet Take 1,000 mg by mouth 2 (two) times daily with a meal.   Yes [provider]  metoprolol tartrate (LOPRESSOR) 25 MG tablet Take 0.5 tablets (12.5 mg total) by mouth 2 (two) times daily. 01/06/18  Yes Nita Sells, MD  pantoprazole (PROTONIX) 40 MG tablet TAKE ONE TABLET BY MOUTH TWICE A DAY BEFORE MEALS Patient taking differently: Take 40 mg by mouth daily.  04/01/17  Yes [name], MD  pioglitazone (ACTOS) 15 MG tablet Take 15 mg by mouth daily.   Yes [provider]  quinapril (ACCUPRIL) 5 MG tablet Take 2.5 mg by mouth every morning.    Yes [provider]  traZODone (DESYREL) 50 MG tablet Take 50 mg by mouth at bedtime.   Yes [provider]  venlafaxine XR (EFFEXOR-XR) 75 MG 24 hr capsule Take 75 mg by  mouth daily.   Yes [provider]  cholestyramine (QUESTRAN) 4 g packet Take 1 packet (4 g total) by mouth every other day. Patient not taking: Reported on 07/05/2018 01/08/18   Nita Sells, MD  clindamycin (CLEOCIN) 300 MG capsule Take 1 capsule (300 mg total) by mouth 3 (three) times daily. Patient not taking: Reported on 07/05/2018 06/28/18   Jola Schmidt, MD  naproxen (NAPROSYN) 500 MG tablet Take 1 tablet (500 mg total) by mouth 2 (two) times daily. Patient not taking: Reported on 07/05/2018 10/31/16   Tanna Furry, MD    Physical Exam: Vitals:   07/05/18 1635 07/05/18 1645 07/05/18 1745 07/05/18 1750  BP:  133/82 126/72   Pulse:  (!) 101  (!) 107  Resp:  20  17  Temp:      TempSrc:      SpO2: 92% 92%  94%    Constitutional: NAD, calm, comfortable Eyes: PERRL, lids and conjunctivae normal ENMT: Mucous membranes are mildly dry.  Lips are  dry. Posterior pharynx clear of any exudate or lesions. Neck: Tracheostomy in place, supple, no masses, no thyromegaly Respiratory: Decreased breath sounds on bases, otherwise clear to auscultation bilaterally, no wheezing, no crackles. Normal respiratory effort. No accessory muscle use.  Cardiovascular: Regular rate and rhythm, no murmurs / rubs / gallops. No extremity edema. 2+ pedal pulses. No carotid bruits.  Abdomen: Obese, soft, positive umbilical hernia, positive right-sided suprapubic tenderness, positive bilateral CVA L/R.  no guarding or rebound.  No masses palpated. No hepatosplenomegaly. Bowel sounds positive.  Musculoskeletal: no clubbing / cyanosis. Good ROM, no contractures. Normal muscle tone.  Skin: there is mild epicardial erythema on lower extremities. No induration on limited dermatological examination Neurologic: CN 2-12 grossly intact. Sensation intact, DTR normal. Strength 5/5 in all 4.  Psychiatric: Normal judgment and insight. Alert and oriented x 3. Normal mood.   Labs on Admission: I have personally reviewed following labs and imaging studies  CBC: Recent Labs  Lab 07/05/18 1241  WBC 9.3  NEUTROABS 6.7  HGB 11.8*  HCT 40.3  MCV 91.4  PLT 323*   Basic Metabolic Panel: Recent Labs  Lab 07/05/18 1241  NA 135  K 5.0  CL 104  CO2 21*  GLUCOSE 193*  BUN 19  CREATININE 2.74*  CALCIUM 9.2   GFR: CrCl cannot be calculated (Unknown ideal weight.). Liver Function Tests: Recent Labs  Lab 07/05/18 1241  AST 15  ALT 15  ALKPHOS 65  BILITOT 0.9  PROT 6.6  ALBUMIN 3.2*   No results for input(s): LIPASE, AMYLASE in the last 168 hours. No results for input(s): AMMONIA in the last 168 hours. Coagulation Profile: No results for input(s): INR, PROTIME in the last 168 hours. Cardiac Enzymes: No results for input(s): CKTOTAL, CKMB, CKMBINDEX, TROPONINI in the last 168 hours. BNP (last 3 results) No results for input(s): PROBNP in the last 8760  hours. HbA1C: No results for input(s): HGBA1C in the last 72 hours. CBG: Recent Labs  Lab 07/05/18 1632  GLUCAP 162*   Lipid Profile: No results for input(s): CHOL, HDL, LDLCALC, TRIG, CHOLHDL, LDLDIRECT in the last 72 hours. Thyroid Function Tests: No results for input(s): TSH, T4TOTAL, FREET4, T3FREE, THYROIDAB in the last 72 hours. Anemia Panel: No results for input(s): VITAMINB12, FOLATE, FERRITIN, TIBC, IRON, RETICCTPCT in the last 72 hours. Urine analysis:    Component Value Date/Time   COLORURINE AMBER (A) 07/05/2018 1229   APPEARANCEUR CLOUDY (A) 07/05/2018 1229   LABSPEC 1.017 07/05/2018 1229  PHURINE 5.0 07/05/2018 1229   GLUCOSEU NEGATIVE 07/05/2018 1229   HGBUR NEGATIVE 07/05/2018 1229   BILIRUBINUR NEGATIVE 07/05/2018 [place] 07/05/2018 1229   PROTEINUR 30 (A) 07/05/2018 1229   UROBILINOGEN 0.2 04/09/2014 1157   NITRITE NEGATIVE 07/05/2018 1229   LEUKOCYTESUR LARGE (A) 07/05/2018 1229    Radiological Exams on Admission: Dg Chest Port 1 View  Result Date: 07/05/2018 CLINICAL DATA:  Tachycardia and hypotensive. EXAM: PORTABLE CHEST 1 VIEW COMPARISON:  06/28/2018 FINDINGS: The cardiac silhouette, mediastinal and hilar contours are within normal limits and stable. Lungs are clear. The tracheostomy tube is in good position. The bony thorax is intact. IMPRESSION: No acute cardiopulmonary findings. Electronically Signed   By: Marijo Sanes M.D.   On: 07/05/2018 13:33    EKG: Independently reviewed.  EKG not available in EMR. EKG paper tracing not available.  Assessment/Plan Principal Problem:   Sepsis secondary to UTI ([place]) Admit to stepdown/inpatient. Continue on supplemental oxygen. Continue IV fluids. Continue aztreonam per pharmacy. Follow-up blood culture and sensitivity. Follow-up urine culture and sensitivity.  Active Problems:   AKI (acute kidney injury) ([place]) Continue IV fluids. Hold ACE inhibitor. Monitor renal function and  electrolytes    Allergic reaction to a drug Continue PRN benadryl. Famotidine 20 mg IVPB every 12 hours. Received Solu-Medrol 125 mg IVP in the ER. Defer further glucocorticoids for now due to uncontrolled diabetes. Monitor for further symptoms. Although she only took a single dose of Bactrim, monitor for [employer] like signs/symptoms.    Hyperlipidemia Continue atorvastatin 20 mg p.o. at bedtime. Follow-up LFTs as needed. Fasting lipid profile per primary as an outpatient.    Hypothyroid Continue levothyroxine 75 mcg p.o. daily. Check TSH as needed.    Uncontrolled diabetes mellitus with diabetic neuropathy, with long-term current use of insulin ([facility]) Continue Levemir 50 units SQ twice a day. Hold metformin due to lactic acidosis. Continue Actos 50 mg p.o. daily.    Essential hypertension Continue metoprolol 12.5 mg p.o. twice daily. Monitor blood pressure and heart rate.    Major depressive disorder, recurrent episode, severe ([place])   Tracheostomy dependence ([place]) Consult trach team and respiratory services.    DVT prophylaxis: SQ heparin. Code Status: Full code. Family Communication: Disposition Plan: Admit for IV antibiotics for several days. Consults called: Admission status: Inpatient/stepdown.   Reubin Milan MD [facility] Pager [phone].  If 7PM-7AM, please contact night-coverage [email or web address] Password [email or web address]  07/05/2018, 6:01 PM

## 2018-07-05 NOTE — Progress Notes (Signed)
Patient admitted to 5w 37. Spouse at bedside. Patient placed on PC monitor M09 running sinus tach. Patient educated on unit expectations and verbalized understanding. Will continue to monitor.

## 2018-07-05 NOTE — ED Notes (Signed)
MD Sabra Heck aware of hypotension in the 80's, pt mentating well. Will continue to monitor.

## 2018-07-05 NOTE — ED Provider Notes (Signed)
Medical screening examination/treatment/procedure(s) were conducted as a shared visit with non-physician practitioner(s) and myself.  I personally evaluated the patient during the encounter.  Clinical Impression:   Final diagnoses:  Sepsis with acute renal failure and septic shock, due to unspecified organism, unspecified acute renal failure type [facility])  Pyelonephritis    The patient is a morbidly obese very complicated 42 year old female who unfortunately underwent a tracheostomy in March after she overdosed on pills, she has done well with this and has gradually improved, she has had to have this removed as soon as she loses some weight.  The patient has recently been evaluated in the hospital when she came in for a sore throat as well as some rectal pain, this was approximately 1 week ago, she had a negative CT scan of the pelvis as well as a CT scan of the pharynx which showed some pharyngitis but no signs of abscess either peritonsillar, retropharyngeal or perirectal.  She was placed on clindamycin, she saw her family doctor yesterday because of some urinary symptoms and ultimately was given Bactrim presumptively for urinary tract infection.  But the patient reports that she has not urinated in over 24 hours, she has progressive lower back pain, abdominal pain and has now developed lightheadedness, generalized weakness and a diffuse rash.  On exam she has an urticarial rash scattered across her legs, this is blanching, it is not vesicular, there is no intraoral lesions.  This is also across her abdomen, chest and some spots on her arms.  She has lesions on her face.  Her trach is in place without any drainage.  She is in no respiratory distress, has no wheezing and speaks in full sentences.  Vital signs reveal tachycardia, hypotension, she will need intramuscular epinephrine, IV line and work-up for sepsis, she will need a Foley catheter to monitor urinary output.  She is very potentially either  septic or anaphylactic or even potentially an early Stevens-Johnson syndrome though I d  I agree with the critical care services provided - Epinepherine, multiple fluid boluses, and broad spectrum antibiotics   Noemi Chapel, MD 07/07/18 1226

## 2018-07-05 NOTE — Progress Notes (Addendum)
Pharmacy Antibiotic Note  [name] is a 42 y.o. female admitted on 07/05/2018 with UTI and worsening pain x 3 days.  Pharmacy has been consulted for Aztreonam dosing and Sepsis.  Was asked to follow for r/o sepsis.  Plan: Aztreonam 2gm x 1 at 13:30PM - code sepsis at 13:03 Begin maintenance of Aztreonam 1gm every 8 hours F/U culture data/sensitivities and clinical response.  Temp (24hrs), Avg:97.9 F (36.6 C), Min:97.9 F (36.6 C), Max:97.9 F (36.6 C)  Recent Labs  Lab 07/05/18 1241 07/05/18 1250  WBC 9.3  --   CREATININE 2.74*  --   LATICACIDVEN  --  2.78*    CrCl cannot be calculated (Unknown ideal weight.).    Allergies  Allergen Reactions  . Penicillins Hives and Other (See Comments)    PATIENT HAS HAD A PCN REACTION WITH IMMEDIATE RASH, FACIAL/TONGUE/THROAT SWELLING, SOB, OR LIGHTHEADEDNESS WITH HYPOTENSION:   YES ** see below HAS PT DEVELOPED SEVERE RASH INVOLVING MUCUS MEMBRANES or SKIN NECROSIS: YES ** see below Has patient had a PCN reaction that required hospitalization No Has patient had a PCN reaction occurring within the last 10 years: No  **TOLERATED ZOSYN 12/12/17    Antimicrobials this admission: Aztreonam 10/26>>    Rober Minion, PharmD., MS Clinical Pharmacist Pager:  [phone] Thank you for allowing pharmacy to be part of this patients care team.

## 2018-07-05 NOTE — ED Notes (Signed)
2 RNS attempted iv access, NO success

## 2018-07-05 NOTE — ED Notes (Signed)
Bladder scan complete results: >85??Marland Kitchen The pt body mass is too big and it was very difficult to preceded.

## 2018-07-06 DIAGNOSIS — N390 Urinary tract infection, site not specified: Secondary | ICD-10-CM

## 2018-07-06 DIAGNOSIS — A419 Sepsis, unspecified organism: Principal | ICD-10-CM

## 2018-07-06 LAB — COMPREHENSIVE METABOLIC PANEL
ALT: 12 U/L (ref 0–44)
AST: 9 U/L — ABNORMAL LOW (ref 15–41)
Albumin: 2.7 g/dL — ABNORMAL LOW (ref 3.5–5.0)
Alkaline Phosphatase: 56 U/L (ref 38–126)
Anion gap: 10 (ref 5–15)
BUN: 17 mg/dL (ref 6–20)
CO2: 21 mmol/L — ABNORMAL LOW (ref 22–32)
Calcium: 9 mg/dL (ref 8.9–10.3)
Chloride: 106 mmol/L (ref 98–111)
Creatinine, Ser: 1.41 mg/dL — ABNORMAL HIGH (ref 0.44–1.00)
GFR calc Af Amer: 52 mL/min — ABNORMAL LOW (ref >60)
GFR calc non Af Amer: 45 mL/min — ABNORMAL LOW (ref >60)
Glucose, Bld: 216 mg/dL — ABNORMAL HIGH (ref 70–99)
Potassium: 5.4 mmol/L — ABNORMAL HIGH (ref 3.5–5.1)
Sodium: 137 mmol/L (ref 135–145)
Total Bilirubin: 0.6 mg/dL (ref 0.3–1.2)
Total Protein: 5.8 g/dL — ABNORMAL LOW (ref 6.5–8.1)

## 2018-07-06 LAB — CBC WITH DIFFERENTIAL/PLATELET
Abs Immature Granulocytes: 0.02 10*3/uL (ref 0.00–0.07)
Basophils Absolute: 0 10*3/uL (ref 0.0–0.1)
Basophils Relative: 0 %
EOS PCT: 0 %
Eosinophils Absolute: 0 10*3/uL (ref 0.0–0.5)
HCT: 33.9 % — ABNORMAL LOW (ref 36.0–46.0)
HEMOGLOBIN: 10.7 g/dL — AB (ref 12.0–15.0)
Immature Granulocytes: 0 %
LYMPHS PCT: 11 %
Lymphs Abs: 0.6 10*3/uL — ABNORMAL LOW (ref 0.7–4.0)
MCH: 27.6 pg (ref 26.0–34.0)
MCHC: 31.6 g/dL (ref 30.0–36.0)
MCV: 87.6 fL (ref 80.0–100.0)
MONO ABS: 0 10*3/uL — AB (ref 0.1–1.0)
MONOS PCT: 1 %
Neutro Abs: 4.6 10*3/uL (ref 1.7–7.7)
Neutrophils Relative %: 88 %
Platelets: 423 10*3/uL — ABNORMAL HIGH (ref 150–400)
RBC: 3.87 MIL/uL (ref 3.87–5.11)
RDW: 13 % (ref 11.5–15.5)
WBC: 5.3 10*3/uL (ref 4.0–10.5)
nRBC: 0 % (ref 0.0–0.2)

## 2018-07-06 LAB — GLUCOSE, CAPILLARY
GLUCOSE-CAPILLARY: 205 mg/dL — AB (ref 70–99)
GLUCOSE-CAPILLARY: 197 mg/dL — AB (ref 70–99)
Glucose-Capillary: 177 mg/dL — ABNORMAL HIGH (ref 70–99)
Glucose-Capillary: 213 mg/dL — ABNORMAL HIGH (ref 70–99)

## 2018-07-06 LAB — MAGNESIUM: MAGNESIUM: 1.4 mg/dL — AB (ref 1.7–2.4)

## 2018-07-06 MED ORDER — SODIUM CHLORIDE 0.9 % IV SOLN
INTRAVENOUS | Status: DC
  Administered 2018-07-07: 06:00:00 via INTRAVENOUS

## 2018-07-06 MED ORDER — SODIUM CHLORIDE 0.9 % IV BOLUS
500.0000 mL | Freq: Once | INTRAVENOUS | Status: AC
  Administered 2018-07-06: 500 mL via INTRAVENOUS

## 2018-07-06 MED ORDER — SODIUM POLYSTYRENE SULFONATE 15 GM/60ML PO SUSP
30.0000 g | Freq: Once | ORAL | Status: AC
  Administered 2018-07-06: 30 g via ORAL
  Filled 2018-07-06: qty 120

## 2018-07-06 MED ORDER — TRAMADOL HCL 50 MG PO TABS
50.0000 mg | ORAL_TABLET | Freq: Once | ORAL | Status: AC
  Administered 2018-07-06: 50 mg via ORAL
  Filled 2018-07-06: qty 1

## 2018-07-06 MED ORDER — MAGNESIUM SULFATE 2 GM/50ML IV SOLN
2.0000 g | Freq: Once | INTRAVENOUS | Status: AC
  Administered 2018-07-06: 2 g via INTRAVENOUS
  Filled 2018-07-06: qty 50

## 2018-07-06 MED ORDER — FUROSEMIDE 10 MG/ML IJ SOLN
40.0000 mg | Freq: Once | INTRAMUSCULAR | Status: AC
  Administered 2018-07-06: 40 mg via INTRAVENOUS
  Filled 2018-07-06: qty 4

## 2018-07-06 MED ORDER — SODIUM CHLORIDE 0.9 % IV SOLN
1.0000 g | INTRAVENOUS | Status: DC
  Administered 2018-07-06 – 2018-07-07 (×2): 1 g via INTRAVENOUS
  Filled 2018-07-06 (×2): qty 10

## 2018-07-06 NOTE — Progress Notes (Signed)
Patient complaining of 9/10 back pain.  No relief after the PRN dose of Tylenol was given.  Notified Jeannette Corpus, NP.  Will continue to monitor the patient

## 2018-07-06 NOTE — Progress Notes (Signed)
Pharmacy Antibiotic Note  [name] is a 42 y.o. female admitted on 07/05/2018 with UTI and worsening pain x 3 days.    Pt was started on Aztreonam for her symptomatic UTI. However, she has got zosyn before. D/w Dr. Candiss Norse, change to ceftriaxone.   Plan: Dc aztreonam Ceftriaxone 1g IV q24 Rx will sign off  Temp (24hrs), Avg:98 F (36.7 C), Min:97.9 F (36.6 C), Max:98 F (36.7 C)  Recent Labs  Lab 07/05/18 1241 07/05/18 1250 07/05/18 1536 07/06/18 0412 07/06/18 0639  WBC 9.3  --   --  5.3  --   CREATININE 2.74*  --   --   --  1.41*  LATICACIDVEN  --  2.78* 1.69  --   --     CrCl cannot be calculated (Unknown ideal weight.).    Allergies  Allergen Reactions  . Bactrim [Sulfamethoxazole-Trimethoprim] Rash  . Penicillins Hives and Other (See Comments)    PATIENT HAS HAD A PCN REACTION WITH IMMEDIATE RASH, FACIAL/TONGUE/THROAT SWELLING, SOB, OR LIGHTHEADEDNESS WITH HYPOTENSION:   YES ** see below HAS PT DEVELOPED SEVERE RASH INVOLVING MUCUS MEMBRANES or SKIN NECROSIS: YES ** see below Has patient had a PCN reaction that required hospitalization No Has patient had a PCN reaction occurring within the last 10 years: No  **TOLERATED ZOSYN 12/12/17    Antimicrobials this admission: Aztreonam 10/26>>10/27 Ceftriaxone 10/27>>  Onnie Boer, PharmD, [place], AAHIVP, CPP Infectious Disease Pharmacist Pager: [phone] 07/06/2018 4:29 PM

## 2018-07-06 NOTE — Progress Notes (Signed)
PROGRESS NOTE                                                                                                                                                                                                             Patient Demographics:    Jovan Schickling, is a 42 y.o. female, DOB - October 05, 1975, GYB:[id number]  Admit date - 07/05/2018   Admitting Physician Reubin Milan, MD  Outpatient Primary MD for the patient is Sandi Mariscal, MD  LOS - 1  Chief Complaint  Patient presents with  . Urinary Tract Infection       Brief Narrative  Kenidy L Lauro is a 42 y.o. female with medical history significant of anxiety, Cushing's disease, depression, type 2 diabetes, hyperlipidemia, hypertension, morbid obesity, sleep apnea, uncontrolled type 2 diabetes, vitamin D deficiency who is brought to the emergency department due to progressively worse UTI with dysuria, frequency and urgency, but no hematuria for the past 2 to 3 days despite taking Bactrim and Flomax yesterday.  The patient also stated that she developed an urticarial rash after taking Bactrim and Flomax, work-up suggested UTI related sepsis with ARF and she was admitted to the hospital.   Subjective:    Omah Szwed today has, No headache, No chest pain, No abdominal pain - No Nausea, No new weakness tingling or numbness, No Cough - SOB.    Assessment  & Plan :    Principal Problem:   Sepsis secondary to UTI [facility]) Active Problems:   Hyperlipidemia   Hypothyroid   Uncontrolled diabetes mellitus with diabetic neuropathy, with long-term current use of insulin (HCC)   Essential hypertension   Major depressive disorder, recurrent episode, severe (HCC)   Tracheostomy dependence ([place])   AKI (acute kidney injury) ([place])   Allergic reaction caused by a drug   1. Sepsis with ARF due to UTI - multiple drug allergies, has been adequately hydrated, cultures have been drawn, currently stabilized on IV meropenem and sepsis physiology  has resolved.  Monitor cultures.  Clinically much better.  2.  Allergic reaction to Bactrim and Flomax.  Resolved after supportive care with Benadryl and steroids.  3.  Dyslipidemia.  On statin.  4.  Hypothyroidism.  Continue Synthroid.  5.  Hypertension.  Stable on beta-blocker continue to monitor.  6.  Morbid obesity, baseline tracheostomy.  Stable.  Continue standard trach protocol.  Chest x-ray stable no acute issues.  7.  Hyperkalemia.  Gentle Lasix and Kayexalate.  Repeat potassium tomorrow.  8.  Hypomagnesemia.  Replaced.    9. DM type II.  Levemir and sliding scale.  Lab Results  Component Value Date   HGBA1C 7.2 (H) 11/20/2017   CBG (last 3)  Recent Labs    07/05/18 1632 07/05/18 2029 07/06/18 0823  GLUCAP 162* 267* 177*     Family Communication  :  None   Code Status :  Full  Disposition Plan  :  Tele  Consults  :  None  Procedures  :    CXR - stable  DVT Prophylaxis  :   Heparin   Lab Results  Component Value Date   PLT 423 (H) 07/06/2018    Diet :  Diet Order            Diet heart healthy/carb modified Room service appropriate? Yes; Fluid consistency: Thin  Diet effective now               Inpatient Medications Scheduled Meds: . atorvastatin  20 mg Oral QHS  . buPROPion  100 mg Oral BID  . furosemide  40 mg Intravenous Once  . gabapentin  600 mg Oral BID  . heparin  5,000 Units Subcutaneous Q8H  . insulin aspart  0-20 Units Subcutaneous TID WC  . insulin detemir  50 Units Subcutaneous BID  . levothyroxine  75 mcg Oral QAC breakfast  . metoprolol tartrate  12.5 mg Oral BID  . pantoprazole  40 mg Oral Daily  . pioglitazone  15 mg Oral Daily  . sodium polystyrene  30 g Oral Once  . traZODone  50 mg Oral QHS  . venlafaxine XR  75 mg Oral Daily   Continuous Infusions: . sodium chloride 100 mL/hr at 07/06/18 0603  . aztreonam 1 g (07/06/18 0605)  . famotidine (PEPCID) IV 20 mg (07/06/18 0831)  . magnesium sulfate 1 - 4 g bolus  IVPB    . sodium chloride     PRN Meds:.acetaminophen **OR** [DISCONTINUED] acetaminophen, diphenhydrAMINE, ondansetron **OR** ondansetron (ZOFRAN) IV  Antibiotics  :   Anti-infectives (From admission, onward)   Start     Dose/Rate Route Frequency Ordered Stop   07/05/18 2200  aztreonam (AZACTAM) 1 g in sodium chloride 0.9 % 100 mL IVPB     1 g 200 mL/hr over 30 Minutes Intravenous Every 8 hours 07/05/18 1451     07/05/18 1330  aztreonam (AZACTAM) 2 g in sodium chloride 0.9 % 100 mL IVPB     2 g 200 mL/hr over 30 Minutes Intravenous  Once 07/05/18 1317 07/05/18 1423          Objective:   Vitals:   07/06/18 0347 07/06/18 0602 07/06/18 0828 07/06/18 0855  BP:  109/69 130/78   Pulse: (!) 112 (!) 106 (!) 103 100  Resp: 20 (!) 25 (!) 22 20  Temp:  97.9 F (36.6 C) 97.9 F (36.6 C)   TempSrc:  Oral Oral   SpO2: 96% 98% 95% 95%    Wt Readings from Last 3 Encounters:  05/22/18 132.5 kg  04/15/18 133.4 kg  02/10/18 131.5 kg     Intake/Output Summary (Last 24 hours) at 07/06/2018 1022 Last data filed at 07/06/2018 0700 Gross per 24 hour  Intake 4756.58 ml  Output 4775 ml  Net -18.42 ml     Physical Exam  Awake Alert, Oriented X 3, No new F.N deficits, Normal affect [place].AT,PERRAL, Trach stable Supple Neck,No JVD, No cervical lymphadenopathy appriciated.  Symmetrical Chest wall movement, Good air movement bilaterally, CTAB  RRR,No Gallops,Rubs or new Murmurs, No Parasternal Heave +ve B.Sounds, Abd Soft, No tenderness, No organomegaly appriciated, No rebound - guarding or rigidity. No Cyanosis, Clubbing or edema, No new Rash or bruise       Data Review:    CBC Recent Labs  Lab 07/05/18 1241 07/06/18 0412  WBC 9.3 5.3  HGB 11.8* 10.7*  HCT 40.3 33.9*  PLT 497* 423*  MCV 91.4 87.6  MCH 26.8 27.6  MCHC 29.3* 31.6  RDW 13.1 13.0  LYMPHSABS 2.0 0.6*  MONOABS 0.5 0.0*  EOSABS 0.1 0.0  BASOSABS 0.0 0.0    Chemistries  Recent Labs  Lab 07/05/18 1241  07/06/18 0639  NA 135 137  K 5.0 5.4*  CL 104 106  CO2 21* 21*  GLUCOSE 193* 216*  BUN 19 17  CREATININE 2.74* 1.41*  CALCIUM 9.2 9.0  MG  --  1.4*  AST 15 9*  ALT 15 12  ALKPHOS 65 56  BILITOT 0.9 0.6   ------------------------------------------------------------------------------------------------------------------ No results for input(s): CHOL, HDL, LDLCALC, TRIG, CHOLHDL, LDLDIRECT in the last 72 hours.  Lab Results  Component Value Date   HGBA1C 7.2 (H) 11/20/2017   ------------------------------------------------------------------------------------------------------------------ No results for input(s): TSH, T4TOTAL, T3FREE, THYROIDAB in the last 72 hours.  Invalid input(s): FREET3 ------------------------------------------------------------------------------------------------------------------ No results for input(s): VITAMINB12, FOLATE, FERRITIN, TIBC, IRON, RETICCTPCT in the last 72 hours.  Coagulation profile No results for input(s): INR, PROTIME in the last 168 hours.  No results for input(s): DDIMER in the last 72 hours.  Cardiac Enzymes No results for input(s): CKMB, TROPONINI, MYOGLOBIN in the last 168 hours.  Invalid input(s): CK ------------------------------------------------------------------------------------------------------------------    Component Value Date/Time   BNP 33.5 12/22/2017 0259    Micro Results Recent Results (from the past 240 hour(s))  MRSA PCR Screening     Status: None   Collection Time: 07/05/18  5:53 PM  Result Value Ref Range Status   MRSA by PCR NEGATIVE NEGATIVE Final    Comment:        The GeneXpert MRSA Assay (FDA approved for NASAL specimens only), is one component of a comprehensive MRSA colonization surveillance program. It is not intended to diagnose MRSA infection nor to guide or monitor treatment for MRSA infections. Performed at [facility], [place] [address] [place], [place] [address]      Radiology Reports   Dg Chest [place] 1 View  -  Result Date: 07/05/2018 -  CLINICAL DATA:  Tachycardia and hypotensive. EXAM: PORTABLE CHEST 1 VIEW COMPARISON:  06/28/2018 FINDINGS: The cardiac silhouette, mediastinal and hilar contours are within normal limits and stable. Lungs are clear. The tracheostomy tube is in good position. The bony thorax is intact. IMPRESSION: No acute cardiopulmonary findings. Electronically Signed   By: Marijo Sanes M.D.   On: 07/05/2018 13:33    Time Spent in minutes  30   Lala Lund M.D on 07/06/2018 at 10:22 AM  To page go to [email or web address] - password [facility]

## 2018-07-07 LAB — BASIC METABOLIC PANEL
Anion gap: 5 (ref 5–15)
BUN: 18 mg/dL (ref 6–20)
CO2: 25 mmol/L (ref 22–32)
Calcium: 8.6 mg/dL — ABNORMAL LOW (ref 8.9–10.3)
Chloride: 108 mmol/L (ref 98–111)
Creatinine, Ser: 1.09 mg/dL — ABNORMAL HIGH (ref 0.44–1.00)
GFR calc Af Amer: >60 mL/min (ref >60)
GFR calc non Af Amer: >60 mL/min (ref >60)
GLUCOSE: 64 mg/dL — AB (ref 70–99)
POTASSIUM: 4.3 mmol/L (ref 3.5–5.1)
Sodium: 138 mmol/L (ref 135–145)

## 2018-07-07 LAB — CBC WITH DIFFERENTIAL/PLATELET
Abs Immature Granulocytes: 0.02 10*3/uL (ref 0.00–0.07)
BASOS PCT: 0 %
Basophils Absolute: 0 10*3/uL (ref 0.0–0.1)
EOS ABS: 0 10*3/uL (ref 0.0–0.5)
EOS PCT: 0 %
HCT: 30.9 % — ABNORMAL LOW (ref 36.0–46.0)
Hemoglobin: 9.6 g/dL — ABNORMAL LOW (ref 12.0–15.0)
Immature Granulocytes: 0 %
Lymphocytes Relative: 42 %
Lymphs Abs: 2.7 10*3/uL (ref 0.7–4.0)
MCH: 27.4 pg (ref 26.0–34.0)
MCHC: 31.1 g/dL (ref 30.0–36.0)
MCV: 88 fL (ref 80.0–100.0)
MONO ABS: 0.4 10*3/uL (ref 0.1–1.0)
MONOS PCT: 6 %
Neutro Abs: 3.2 10*3/uL (ref 1.7–7.7)
Neutrophils Relative %: 52 %
PLATELETS: 385 10*3/uL (ref 150–400)
RBC: 3.51 MIL/uL — ABNORMAL LOW (ref 3.87–5.11)
RDW: 13.2 % (ref 11.5–15.5)
WBC: 6.4 10*3/uL (ref 4.0–10.5)
nRBC: 0 % (ref 0.0–0.2)

## 2018-07-07 LAB — GLUCOSE, CAPILLARY
GLUCOSE-CAPILLARY: 141 mg/dL — AB (ref 70–99)
GLUCOSE-CAPILLARY: 150 mg/dL — AB (ref 70–99)
Glucose-Capillary: 66 mg/dL — ABNORMAL LOW (ref 70–99)
Glucose-Capillary: 87 mg/dL (ref 70–99)
Glucose-Capillary: 178 mg/dL — ABNORMAL HIGH (ref 70–99)

## 2018-07-07 LAB — MAGNESIUM: Magnesium: 1.7 mg/dL (ref 1.7–2.4)

## 2018-07-07 MED ORDER — HYDROCODONE-ACETAMINOPHEN 10-325 MG PO TABS: 1.0000 | ORAL_TABLET | Freq: Four times a day (QID) | ORAL | Status: DC | PRN

## 2018-07-07 MED ORDER — PNEUMOCOCCAL VAC POLYVALENT 25 MCG/0.5ML IJ INJ
0.5000 mL | INJECTION | INTRAMUSCULAR | Status: AC
  Administered 2018-07-08: 0.5 mL via INTRAMUSCULAR
  Filled 2018-07-07: qty 0.5

## 2018-07-07 MED ORDER — INSULIN DETEMIR 100 UNIT/ML ~~LOC~~ SOLN
45.0000 [IU] | Freq: Two times a day (BID) | SUBCUTANEOUS | Status: DC
  Administered 2018-07-07 – 2018-07-08 (×3): 45 [IU] via SUBCUTANEOUS
  Filled 2018-07-07 (×4): qty 0.45

## 2018-07-07 NOTE — Progress Notes (Signed)
PROGRESS NOTE                                                                                                                                                                                                             Patient Demographics:    [name], is a 42 y.o. female, DOB - [birth year], WFU:[id number]  Admit date - 07/05/2018   Admitting Physician Reubin Milan, MD  Outpatient Primary MD for the patient is Sandi Mariscal, MD  LOS - 2  Chief Complaint  Patient presents with  . Urinary Tract Infection       Brief Narrative  [name] is a 42 y.o. female with medical history significant of anxiety, Cushing's disease, depression, type 2 diabetes, hyperlipidemia, hypertension, morbid obesity, sleep apnea, uncontrolled type 2 diabetes, vitamin D deficiency who is brought to the emergency department due to progressively worse UTI with dysuria, frequency and urgency, but no hematuria for the past 2 to 3 days despite taking Bactrim and Flomax yesterday.  The patient also stated that she developed an urticarial rash after taking Bactrim and Flomax, work-up suggested UTI related sepsis with ARF and she was admitted to the hospital.   Subjective:   Patient in bed, appears comfortable, denies any headache, no fever, no chest pain or pressure, no shortness of breath , no abdominal pain. No focal weakness.   Assessment  & Plan :     1. Sepsis with ARF due to UTI -currently stable after aggressive hydration, sepsis physiology has completely resolved, cultures so far negative, continue IV Rocephin and follow final culture results, advance activity, moved to a MedSurg bed, if stable discharge tomorrow.  2.  Allergic reaction to Bactrim and Flomax.  Resolved after supportive care with Benadryl and steroids.  3.  Dyslipidemia.  On statin.  4.  Hypothyroidism.  Continue Synthroid.  5.  Hypertension.  Stable on beta-blocker continue to monitor.  6.  Morbid obesity, baseline  tracheostomy.  Stable.  Continue standard trach protocol.  Chest x-ray stable no acute issues.  7.  Hyperkalemia.  Resolved after Lasix and Kayexalate.  8.  Hypomagnesemia.  Replaced.    9. DM type II.  Decent outpatient control, a.m. sugars were slightly low will drop Levemir dose and monitor.  Lab Results  Component Value Date   HGBA1C 7.2 (H) 11/20/2017   CBG (last 3)  Recent Labs    07/06/18 2124 07/07/18 0729 07/07/18 0814  GLUCAP 213* 66* 87     Family  Communication  :  None   Code Status :  Full  Disposition Plan  :  Med  Consults  :  None  Procedures  :    CXR - stable  DVT Prophylaxis  :   Heparin   Lab Results  Component Value Date   PLT 385 07/07/2018    Diet :  Diet Order            Diet heart healthy/carb modified Room service appropriate? Yes; Fluid consistency: Thin  Diet effective now               Inpatient Medications Scheduled Meds: . atorvastatin  20 mg Oral QHS  . buPROPion  100 mg Oral BID  . gabapentin  600 mg Oral BID  . heparin  5,000 Units Subcutaneous Q8H  . insulin aspart  0-20 Units Subcutaneous TID WC  . insulin detemir  45 Units Subcutaneous BID  . levothyroxine  75 mcg Oral QAC breakfast  . metoprolol tartrate  12.5 mg Oral BID  . pantoprazole  40 mg Oral Daily  . pioglitazone  15 mg Oral Daily  . [START ON 07/08/2018] pneumococcal 23 valent vaccine  0.5 mL Intramuscular Tomorrow-1000  . traZODone  50 mg Oral QHS  . venlafaxine XR  75 mg Oral Daily   Continuous Infusions: . cefTRIAXone (ROCEPHIN)  IV 1 g (07/06/18 1811)  . famotidine (PEPCID) IV 20 mg (07/06/18 2120)   PRN Meds:.acetaminophen **OR** [DISCONTINUED] acetaminophen, diphenhydrAMINE, HYDROcodone-acetaminophen, [DISCONTINUED] ondansetron **OR** ondansetron (ZOFRAN) IV  Antibiotics  :   Anti-infectives (From admission, onward)   Start     Dose/Rate Route Frequency Ordered Stop   07/06/18 1700  cefTRIAXone (ROCEPHIN) 1 g in sodium chloride 0.9 % 100  mL IVPB     1 g 200 mL/hr over 30 Minutes Intravenous Every 24 hours 07/06/18 1625     07/05/18 2200  aztreonam (AZACTAM) 1 g in sodium chloride 0.9 % 100 mL IVPB  Status:  Discontinued     1 g 200 mL/hr over 30 Minutes Intravenous Every 8 hours 07/05/18 1451 07/06/18 1625   07/05/18 1330  aztreonam (AZACTAM) 2 g in sodium chloride 0.9 % 100 mL IVPB     2 g 200 mL/hr over 30 Minutes Intravenous  Once 07/05/18 1317 07/05/18 1423          Objective:   Vitals:   07/06/18 2300 07/07/18 0536 07/07/18 0817 07/07/18 0911  BP:  (!) 103/55 121/89   Pulse: 82  83 82  Resp: (!) 22 13 15 15   Temp:  98.1 F (36.7 C) 97.9 F (36.6 C)   TempSrc:   Oral   SpO2: 100%  97% 98%    Wt Readings from Last 3 Encounters:  05/22/18 132.5 kg  04/15/18 133.4 kg  02/10/18 131.5 kg     Intake/Output Summary (Last 24 hours) at 07/07/2018 0940 Last data filed at 07/07/2018 0842 Gross per 24 hour  Intake 2208.03 ml  Output 3125 ml  Net -916.97 ml     Physical Exam  Awake Alert, Oriented X 3, No new F.N deficits, Normal affect [place].AT,PERRAL, Trach in place Supple Neck,No JVD, No cervical lymphadenopathy appriciated.  Symmetrical Chest wall movement, Good air movement bilaterally, CTAB RRR,No Gallops, Rubs or new Murmurs, No Parasternal Heave +ve B.Sounds, Abd Soft, No tenderness, No organomegaly appriciated, No rebound - guarding or rigidity. No Cyanosis, Clubbing or edema, No new Rash or bruise    Data Review:    CBC Recent Labs  Lab 07/05/18 1241 07/06/18 0412 07/07/18 0456  WBC 9.3 5.3 6.4  HGB 11.8* 10.7* 9.6*  HCT 40.3 33.9* 30.9*  PLT 497* 423* 385  MCV 91.4 87.6 88.0  MCH 26.8 27.6 27.4  MCHC 29.3* 31.6 31.1  RDW 13.1 13.0 13.2  LYMPHSABS 2.0 0.6* 2.7  MONOABS 0.5 0.0* 0.4  EOSABS 0.1 0.0 0.0  BASOSABS 0.0 0.0 0.0    Chemistries  Recent Labs  Lab 07/05/18 1241 07/06/18 0639 07/07/18 0456  NA 135 137 138  K 5.0 5.4* 4.3  CL 104 106 108  CO2 21* 21* 25    GLUCOSE 193* 216* 64*  BUN 19 17 18   CREATININE 2.74* 1.41* 1.09*  CALCIUM 9.2 9.0 8.6*  MG  --  1.4* 1.7  AST 15 9*  --   ALT 15 12  --   ALKPHOS 65 56  --   BILITOT 0.9 0.6  --    ------------------------------------------------------------------------------------------------------------------ No results for input(s): CHOL, HDL, LDLCALC, TRIG, CHOLHDL, LDLDIRECT in the last 72 hours.  Lab Results  Component Value Date   HGBA1C 7.2 (H) 11/20/2017   ------------------------------------------------------------------------------------------------------------------ No results for input(s): TSH, T4TOTAL, T3FREE, THYROIDAB in the last 72 hours.  Invalid input(s): FREET3 ------------------------------------------------------------------------------------------------------------------ No results for input(s): VITAMINB12, FOLATE, FERRITIN, TIBC, IRON, RETICCTPCT in the last 72 hours.  Coagulation profile No results for input(s): INR, PROTIME in the last 168 hours.  No results for input(s): DDIMER in the last 72 hours.  Cardiac Enzymes No results for input(s): CKMB, TROPONINI, MYOGLOBIN in the last 168 hours.  Invalid input(s): CK ------------------------------------------------------------------------------------------------------------------    Component Value Date/Time   BNP 33.5 12/22/2017 0259    Micro Results Recent Results (from the past 240 hour(s))  Blood Culture (routine x 2)     Status: None (Preliminary result)   Collection Time: 07/05/18  1:07 PM  Result Value Ref Range Status   Specimen Description BLOOD RIGHT ANTECUBITAL  Final   Special Requests   Final    BOTTLES DRAWN AEROBIC AND ANAEROBIC Blood Culture results may not be optimal due to an inadequate volume of blood received in culture bottles   Culture   Final    NO GROWTH < 24 HOURS Performed at [facility] [address], [place], [place] [address]    Report Status PENDING  Incomplete  Blood  Culture (routine x 2)     Status: None (Preliminary result)   Collection Time: 07/05/18  1:22 PM  Result Value Ref Range Status   Specimen Description BLOOD LEFT ANTECUBITAL  Final   Special Requests   Final    BOTTLES DRAWN AEROBIC AND ANAEROBIC Blood Culture results may not be optimal due to an inadequate volume of blood received in culture bottles   Culture   Final    NO GROWTH < 24 HOURS Performed at [facility], [place] [address], [place], [place] [address]    Report Status PENDING  Incomplete  MRSA PCR Screening     Status: None   Collection Time: 07/05/18  5:53 PM  Result Value Ref Range Status   MRSA by PCR NEGATIVE NEGATIVE Final    Comment:        The GeneXpert MRSA Assay (FDA approved for NASAL specimens only), is one component of a comprehensive MRSA colonization surveillance program. It is not intended to diagnose MRSA infection nor to guide or monitor treatment for MRSA infections. Performed at [facility], [place] [address] [place], [place] [address]  Radiology Reports   Dg Chest Port 1 View  -  Result Date: 07/05/2018 -  CLINICAL DATA:  Tachycardia and hypotensive. EXAM: PORTABLE CHEST 1 VIEW COMPARISON:  06/28/2018 FINDINGS: The cardiac silhouette, mediastinal and hilar contours are within normal limits and stable. Lungs are clear. The tracheostomy tube is in good position. The bony thorax is intact. IMPRESSION: No acute cardiopulmonary findings. Electronically Signed   By: Marijo Sanes M.D.   On: 07/05/2018 13:33    Time Spent in minutes  30   Lala Lund M.D on 07/07/2018 at 9:40 AM  To page go to [email or web address] - password [facility]

## 2018-07-08 ENCOUNTER — Ambulatory Visit: Payer: Medicaid Other | Admitting: Neurology

## 2018-07-08 LAB — GLUCOSE, CAPILLARY: Glucose-Capillary: 142 mg/dL — ABNORMAL HIGH (ref 70–99)

## 2018-07-08 MED ORDER — CEPHALEXIN 500 MG PO CAPS: 500.0000 mg | ORAL_CAPSULE | Freq: Three times a day (TID) | ORAL | 0 refills | Status: DC

## 2018-07-08 MED FILL — CEPHALEXIN 500 MG CAPSULE: 500 | 5 days supply | Qty: 15 | Fill #0

## 2018-07-08 NOTE — Discharge Summary (Signed)
[name] Juba OJJ:[id number] DOB: 05/17/1976 DOA: 07/05/2018  PCP: Sandi Mariscal, MD  Admit date: 07/05/2018  Discharge date: 07/08/2018  Admitted From: Home   Disposition:  Home   Recommendations for Outpatient Follow-up:   Follow up with PCP in 1-2 weeks  PCP Please obtain BMP/CBC, 2 view CXR in 1week,  (see Discharge instructions)   PCP Please follow up on the following pending results:    Home Health: Continue previous HHPT   Equipment/Devices: None  Consultations: None Discharge Condition: Stable   CODE STATUS: Full   Diet Recommendation: Heart Healthy Low Carb   Chief Complaint  Patient presents with  . Urinary Tract Infection     Brief history of present illness from the day of admission and additional interim summary    [name] a 42 y.o.femalewith medical history significant ofanxiety, Cushing's disease, depression, type 2 diabetes, hyperlipidemia, hypertension, morbid obesity, sleep apnea, uncontrolled type 2 diabetes, vitamin D deficiency who is brought to the emergency department due to progressively worse UTIwith dysuria, frequency and urgency, but no hematuriafor the past 2 to 3 days despite taking Bactrim and Flomax yesterday.The patient also stated that she developed an urticarial rash after taking Bactrim and Flomax, work-up suggested UTI related sepsis with ARF and she was admitted to the hospital.                                                                 Hospital Course    1. Sepsis with ARF due to UTI -currently stable after aggressive hydration, sepsis physiology has completely resolved, cultures so far negative, symptom-free now and responded very well to IV Rocephin, received 3 doses of the same will get 5 more days of oral Keflex upon discharge with outpatient PCP  follow-up.  2.  Allergic reaction to Bactrim and Flomax.  Resolved after supportive care with Benadryl and steroids.  3.  Dyslipidemia.  On statin.  4.  Hypothyroidism.  Continue Synthroid.  5.  Hypertension.  Stable on beta-blocker continue to monitor.  6.  Morbid obesity, baseline tracheostomy.  Stable.  Continue standard trach protocol.  Chest x-ray stable no acute issues.  7.  Hyperkalemia.  Resolved after Lasix and Kayexalate.  8.  Hypomagnesemia.  Replaced and stable.    9. DM type II.    Renew home regimen.  Lab Results  Component Value Date   HGBA1C 7.2 (H) 11/20/2017   CBG (last 3)  Recent Labs    07/07/18 1217 07/07/18 1724 07/07/18 2107  GLUCAP 141* 150* 178*    Discharge diagnosis     Principal Problem:   Sepsis secondary to UTI [facility]) Active Problems:   Hyperlipidemia   Hypothyroid   Uncontrolled diabetes mellitus with diabetic neuropathy, with long-term current use of insulin (HCC)   Essential hypertension  Major depressive disorder, recurrent episode, severe (HCC)   Tracheostomy dependence ([place])   AKI (acute kidney injury) ([place])   Allergic reaction caused by a drug    Discharge instructions    Discharge Instructions    Discharge instructions   Complete by:  As directed    Follow with Primary MD Sandi Mariscal, MD in 7 days   Get CBC, CMP checked  by Primary MD  in 5-7 days   Activity: As tolerated with Full fall precautions use walker/cane & assistance as needed  Disposition Home    Diet: Heart Healthy Low Carb with feeding assistance and aspiration precautions.  For Heart failure patients - Check your Weight same time everyday, if you gain over 2 pounds, or you develop in leg swelling, experience more shortness of breath or chest pain, call your Primary MD immediately. Follow Cardiac Low Salt Diet and 1.5 lit/day fluid restriction.  Special Instructions: If you have smoked or chewed Tobacco  in the last 2 yrs please stop smoking, stop  any regular Alcohol  and or any Recreational drug use.  On your next visit with your primary care physician please Get Medicines reviewed and adjusted.  Please request your Prim.MD to go over all Hospital Tests and Procedure/Radiological results at the follow up, please get all Hospital records sent to your Prim MD by signing hospital release before you go home.  If you experience worsening of your admission symptoms, develop shortness of breath, life threatening emergency, suicidal or homicidal thoughts you must seek medical attention immediately by calling 911 or calling your MD immediately  if symptoms less severe.  You Must read complete instructions/literature along with all the possible adverse reactions/side effects for all the Medicines you take and that have been prescribed to you. Take any new Medicines after you have completely understood and accpet all the possible adverse reactions/side effects.   Increase activity slowly   Complete by:  As directed       Discharge Medications   Allergies as of 07/08/2018      Reactions   Bactrim [sulfamethoxazole-trimethoprim] Rash   Penicillins Hives, Other (See Comments)   PATIENT HAS HAD A PCN REACTION WITH IMMEDIATE RASH, FACIAL/TONGUE/THROAT SWELLING, SOB, OR LIGHTHEADEDNESS WITH HYPOTENSION:   YES ** see below HAS PT DEVELOPED SEVERE RASH INVOLVING MUCUS MEMBRANES or SKIN NECROSIS: YES ** see below Has patient had a PCN reaction that required hospitalization No Has patient had a PCN reaction occurring within the last 10 years: No **TOLERATED ZOSYN 12/12/17      Medication List    STOP taking these medications   clindamycin 300 MG capsule Commonly known as:  CLEOCIN     TAKE these medications   atorvastatin 20 MG tablet Commonly known as:  LIPITOR Take 20 mg by mouth at bedtime.   buPROPion 100 MG 12 hr tablet Commonly known as:  WELLBUTRIN SR Take 100 mg by mouth 2 (two) times daily.   cephALEXin 500 MG capsule Commonly  known as:  KEFLEX Take 1 capsule (500 mg total) by mouth 3 (three) times daily for 10 days.   cholestyramine 4 g packet Commonly known as:  QUESTRAN Take 1 packet (4 g total) by mouth every other day.   fluticasone 50 MCG/ACT nasal spray Commonly known as:  FLONASE Place 2 sprays into both nostrils daily.   gabapentin 600 MG tablet Commonly known as:  NEURONTIN Take 600 mg by mouth 2 (two) times daily.   ibuprofen 600 MG tablet Commonly known as:  ADVIL,MOTRIN Take 1 tablet (600 mg total) by mouth every 6 (six) hours as needed.   LEVEMIR FLEXTOUCH 100 UNIT/ML Pen Generic drug:  Insulin Detemir Inject 50 Units into the skin 2 (two) times daily.   levothyroxine 75 MCG tablet Commonly known as:  SYNTHROID, LEVOTHROID Take 1 tablet (75 mcg total) by mouth daily before breakfast.   lisinopril 2.5 MG tablet Commonly known as:  PRINIVIL,ZESTRIL Take 2.5 mg by mouth daily.   metFORMIN 1000 MG tablet Commonly known as:  GLUCOPHAGE Take 1,000 mg by mouth 2 (two) times daily with a meal.   metoprolol tartrate 25 MG tablet Commonly known as:  LOPRESSOR Take 0.5 tablets (12.5 mg total) by mouth 2 (two) times daily.   naproxen 500 MG tablet Commonly known as:  NAPROSYN Take 1 tablet (500 mg total) by mouth 2 (two) times daily.   pantoprazole 40 MG tablet Commonly known as:  PROTONIX TAKE ONE TABLET BY MOUTH TWICE A DAY BEFORE MEALS What changed:  See the new instructions.   pioglitazone 15 MG tablet Commonly known as:  ACTOS Take 15 mg by mouth daily.   quinapril 5 MG tablet Commonly known as:  ACCUPRIL Take 2.5 mg by mouth every morning.   traZODone 50 MG tablet Commonly known as:  DESYREL Take 50 mg by mouth at bedtime.   venlafaxine XR 75 MG 24 hr capsule Commonly known as:  EFFEXOR-XR Take 75 mg by mouth daily.       Follow-up Information    Sandi Mariscal, MD. Schedule an appointment as soon as possible for a visit in 1 week(s).   Specialty:  Internal  Medicine Contact information: [place] [address] [phone]           Major procedures and Radiology Reports - PLEASE review detailed and final reports thoroughly  -        Dg Chest 2 View  Result Date: 06/28/2018 CLINICAL DATA:  42 y/o  F; throat pain for 3-4 weeks. EXAM: CHEST - 2 VIEW COMPARISON:  05/22/2018 chest radiograph. FINDINGS: Stable cardiomegaly given projection and technique. Stable positioning of tracheostomy tube. No consolidation, effusion, or pneumothorax. No acute osseous abnormality is evident. IMPRESSION: Stable cardiomegaly. No acute pulmonary process identified. Electronically Signed   By: Kristine Garbe M.D.   On: 06/28/2018 06:36   Ct Soft Tissue Neck W Contrast  Result Date: 06/28/2018 CLINICAL DATA:  Sore throat for 3 weeks. Epiglottitis or tonsillitis suspected. EXAM: CT NECK WITH CONTRAST TECHNIQUE: Multidetector CT imaging of the neck was performed using the standard protocol following the bolus administration of intravenous contrast. CONTRAST:  126mL ISOVUE-300 IOPAMIDOL (ISOVUE-300) INJECTION 61% COMPARISON:  CT of the neck 12/10/2017. FINDINGS: Pharynx and larynx: There is marked enlargement of the adenoid tissue. No discrete mass lesion is present. Palatine tonsils are moderately enlarged. No abscess or mass is present. Tongue base is within normal limits. Epiglottis is unremarkable. Hypopharynx is within normal limits. Vocal cords are midline. Tracheostomy tube is in place. Salivary glands: Submandibular and parotid glands are within normal limits bilaterally. Thyroid: Unremarkable. Lymph nodes: Enlarged bilateral to get a good gastric lymph nodes are likely reactive. Other enlarged level 2 nodes are more prominent left than right. Vascular: No significant vascular calcifications are present. Limited intracranial: Unremarkable. Visualized orbits: Bilateral lens replacements are present. Globes and orbits are within normal  limits otherwise. Mastoids and visualized paranasal sinuses: The paranasal sinuses and mastoid air cells are clear. Skeleton: Vertebral body heights are normal. Alignment is  anatomic. No focal lytic or blastic lesions are present. Uncovertebral and facet disease is greatest at C5-6 with osseous foraminal narrowing bilaterally. Upper chest: The lung apices are clear. IMPRESSION: 1. Prominent adenoid tissue and palatine tonsils compatible with acute pharyngitis. No abscess or mass lesion is present. 2. Left greater than right level 2 cervical adenopathy is likely reactive. 3. Tracheostomy tube in place without complication. 4. Degenerative changes of the cervical spine are most pronounced at C5-6. Electronically Signed   By: San Morelle M.D.   On: 06/28/2018 10:00   Ct Pelvis W Contrast  Result Date: 06/28/2018 CLINICAL DATA:  42 year old with concern for rectal abscess. Pain and burning around the rectum. EXAM: CT PELVIS WITH CONTRAST TECHNIQUE: Multidetector CT imaging of the pelvis was performed using the standard protocol following the bolus administration of intravenous contrast. CONTRAST:  110mL ISOVUE-300 IOPAMIDOL (ISOVUE-300) INJECTION 61% COMPARISON:  10/31/2016 FINDINGS: Urinary Tract: Normal appearance of the urinary bladder. Limited evaluation of the kidneys and no gross abnormality to the visualized ureters. Bowel: Normal appearance of the visualized bowel structures. Normal appearance of the rectum and perianal region. There is no significant soft tissue swelling in the perineum. Vascular/Lymphatic: Small lymph nodes in the inguinal regions and lower pelvis are again noted. Lower right external iliac lymph node has mildly enlarged in size measuring 1.2 cm in the short axis and previously measured 0.9 cm in the short axis. Inguinal lymph nodes are slightly more prominent than they were on the prior examination. Atherosclerotic calcifications in the iliac arteries. Reproductive:  The uterus  and adnexal structures are unremarkable. Other: No free fluid. Again noted are small ventral hernias containing fat. Mild subcutaneous edema. Musculoskeletal: No acute bone abnormality. Degenerative facet disease in lower lumbar spine. IMPRESSION: 1. No evidence for a perirectal or perianal abscess. 2. Small ventral hernias containing fat. 3. Slightly enlarged inguinal and lower pelvic lymph nodes. Findings are nonspecific but likely reactive. Electronically Signed   By: Markus Daft M.D.   On: 06/28/2018 10:23   Dg Chest Port 1 View  Result Date: 07/05/2018 CLINICAL DATA:  Tachycardia and hypotensive. EXAM: PORTABLE CHEST 1 VIEW COMPARISON:  06/28/2018 FINDINGS: The cardiac silhouette, mediastinal and hilar contours are within normal limits and stable. Lungs are clear. The tracheostomy tube is in good position. The bony thorax is intact. IMPRESSION: No acute cardiopulmonary findings. Electronically Signed   By: Marijo Sanes M.D.   On: 07/05/2018 13:33    Micro Results    Recent Results (from the past 240 hour(s))  Blood Culture (routine x 2)     Status: None (Preliminary result)   Collection Time: 07/05/18  1:07 PM  Result Value Ref Range Status   Specimen Description BLOOD RIGHT ANTECUBITAL  Final   Special Requests   Final    BOTTLES DRAWN AEROBIC AND ANAEROBIC Blood Culture results may not be optimal due to an inadequate volume of blood received in culture bottles   Culture   Final    NO GROWTH 2 DAYS Performed at [facility] [address], [place], [place] [address]    Report Status PENDING  Incomplete  Blood Culture (routine x 2)     Status: None (Preliminary result)   Collection Time: 07/05/18  1:22 PM  Result Value Ref Range Status   Specimen Description BLOOD LEFT ANTECUBITAL  Final   Special Requests   Final    BOTTLES DRAWN AEROBIC AND ANAEROBIC Blood Culture results may not be optimal due to an inadequate  volume of blood received in culture bottles   Culture   Final     NO GROWTH 2 DAYS Performed at [facility], [place] [address], [place], [place] [address]    Report Status PENDING  Incomplete  MRSA PCR Screening     Status: None   Collection Time: 07/05/18  5:53 PM  Result Value Ref Range Status   MRSA by PCR NEGATIVE NEGATIVE Final    Comment:        The GeneXpert MRSA Assay (FDA approved for NASAL specimens only), is one component of a comprehensive MRSA colonization surveillance program. It is not intended to diagnose MRSA infection nor to guide or monitor treatment for MRSA infections. Performed at [facility], [place] [address], [place], [place] [address]     Today   Subjective    [name] today has no headache,no chest abdominal pain,no new weakness tingling or numbness, feels much better wants to go home today.    Objective   Blood pressure 120/79, pulse 76, temperature 98 F (36.7 C), temperature source Oral, resp. rate 18, SpO2 98 %.   Intake/Output Summary (Last 24 hours) at 07/08/2018 0906 Last data filed at 07/08/2018 0600 Gross per 24 hour  Intake 150 ml  Output 650 ml  Net -500 ml    Exam Awake Alert, Oriented x 3, No new F.N deficits, Normal affect Agar.AT,PERRAL, Trach site stable Supple Neck,No JVD, No cervical lymphadenopathy appriciated.  Symmetrical Chest wall movement, Good air movement bilaterally, CTAB RRR,No Gallops,Rubs or new Murmurs, No Parasternal Heave +ve B.Sounds, Abd Soft, Non tender, No organomegaly appriciated, No rebound -guarding or rigidity. No Cyanosis, Clubbing or edema, No new Rash or bruise   Data Review   CBC w Diff:  Lab Results  Component Value Date   WBC 6.4 07/07/2018   HGB 9.6 (L) 07/07/2018   HCT 30.9 (L) 07/07/2018   PLT 385 07/07/2018   LYMPHOPCT 42 07/07/2018   MONOPCT 6 07/07/2018   EOSPCT 0 07/07/2018   BASOPCT 0 07/07/2018    CMP:  Lab Results  Component Value Date   NA 138 07/07/2018   K 4.3 07/07/2018   CL 108 07/07/2018   CO2 25 07/07/2018    BUN 18 07/07/2018   CREATININE 1.09 (H) 07/07/2018   PROT 5.8 (L) 07/06/2018   ALBUMIN 2.7 (L) 07/06/2018   BILITOT 0.6 07/06/2018   ALKPHOS 56 07/06/2018   AST 9 (L) 07/06/2018   ALT 12 07/06/2018  .   Total Time in preparing paper work, data evaluation and todays exam - 23 minutes  Lala Lund M.D on 07/08/2018 at 9:06 AM  [facility]   Office  [phone]

## 2018-07-08 NOTE — Progress Notes (Signed)
[name] to be D/C'd Home per MD order.  Discussed with the patient and all questions fully answered.  VSS, Skin clean, dry and intact without evidence of skin break down, no evidence of skin tears noted. IV catheter discontinued intact.  Site without signs and symptoms of complications. Dressing and pressure applied. Tracheostomy managed by respiratory therapy.  An After Visit Summary was printed and given to the patient. Patient received prescription from pharmacy.   D/c education completed with patient/family including follow up instructions, medication list, d/c activities limitations if indicated, with other d/c instructions as indicated by MD - patient able to verbalize understanding, all questions fully answered.   Patient instructed to return to ED, call 911, or call MD for any changes in condition.   Patient escorted via [facility] by volunteer services, and D/C home via private auto; husband present.   Howard Pouch 07/08/2018 1:02 PM

## 2018-07-08 NOTE — Discharge Instructions (Signed)
Follow with Primary MD Sandi Mariscal, MD in 7 days   Get CBC, CMP checked  by Primary MD  in 5-7 days   Activity: As tolerated with Full fall precautions use walker/cane & assistance as needed  Disposition Home    Diet: Heart Healthy Low Carb with feeding assistance and aspiration precautions.  For Heart failure patients - Check your Weight same time everyday, if you gain over 2 pounds, or you develop in leg swelling, experience more shortness of breath or chest pain, call your Primary MD immediately. Follow Cardiac Low Salt Diet and 1.5 lit/day fluid restriction.  Special Instructions: If you have smoked or chewed Tobacco  in the last 2 yrs please stop smoking, stop any regular Alcohol  and or any Recreational drug use.  On your next visit with your primary care physician please Get Medicines reviewed and adjusted.  Please request your Prim.MD to go over all Hospital Tests and Procedure/Radiological results at the follow up, please get all Hospital records sent to your Prim MD by signing hospital release before you go home.  If you experience worsening of your admission symptoms, develop shortness of breath, life threatening emergency, suicidal or homicidal thoughts you must seek medical attention immediately by calling 911 or calling your MD immediately  if symptoms less severe.  You Must read complete instructions/literature along with all the possible adverse reactions/side effects for all the Medicines you take and that have been prescribed to you. Take any new Medicines after you have completely understood and accpet all the possible adverse reactions/side effects.

## 2018-07-10 LAB — CULTURE, BLOOD (ROUTINE X 2)
CULTURE: NO GROWTH
Culture: NO GROWTH

## 2018-07-14 ENCOUNTER — Other Ambulatory Visit: Payer: Self-pay

## 2018-07-14 ENCOUNTER — Emergency Department (HOSPITAL_COMMUNITY): Payer: Medicaid Other

## 2018-07-14 ENCOUNTER — Encounter (HOSPITAL_COMMUNITY): Payer: Self-pay | Admitting: Emergency Medicine

## 2018-07-14 ENCOUNTER — Inpatient Hospital Stay (HOSPITAL_COMMUNITY)
Admission: EM | Admit: 2018-07-14 | Discharge: 2018-07-16 | DRG: 690 | Disposition: A | Discharge status: Home / Self Care | Payer: Medicaid Other | Attending: Family Medicine | Admitting: Family Medicine

## 2018-07-14 DIAGNOSIS — IMO0002 Reserved for concepts with insufficient information to code with codable children: Secondary | ICD-10-CM

## 2018-07-14 DIAGNOSIS — E039 Hypothyroidism, unspecified: Secondary | ICD-10-CM | POA: Diagnosis present

## 2018-07-14 DIAGNOSIS — E1169 Type 2 diabetes mellitus with other specified complication: Secondary | ICD-10-CM | POA: Diagnosis present

## 2018-07-14 DIAGNOSIS — E1140 Type 2 diabetes mellitus with diabetic neuropathy, unspecified: Secondary | ICD-10-CM | POA: Diagnosis present

## 2018-07-14 DIAGNOSIS — I10 Essential (primary) hypertension: Secondary | ICD-10-CM

## 2018-07-14 DIAGNOSIS — N390 Urinary tract infection, site not specified: Secondary | ICD-10-CM | POA: Diagnosis not present

## 2018-07-14 DIAGNOSIS — E249 Cushing's syndrome, unspecified: Secondary | ICD-10-CM | POA: Diagnosis present

## 2018-07-14 DIAGNOSIS — N179 Acute kidney failure, unspecified: Secondary | ICD-10-CM | POA: Diagnosis present

## 2018-07-14 DIAGNOSIS — N12 Tubulo-interstitial nephritis, not specified as acute or chronic: Principal | ICD-10-CM | POA: Diagnosis present

## 2018-07-14 DIAGNOSIS — F32A Depression, unspecified: Secondary | ICD-10-CM | POA: Diagnosis present

## 2018-07-14 DIAGNOSIS — E662 Morbid (severe) obesity with alveolar hypoventilation: Secondary | ICD-10-CM

## 2018-07-14 DIAGNOSIS — R34 Anuria and oliguria: Secondary | ICD-10-CM | POA: Diagnosis present

## 2018-07-14 DIAGNOSIS — I152 Hypertension secondary to endocrine disorders: Secondary | ICD-10-CM | POA: Diagnosis present

## 2018-07-14 DIAGNOSIS — F419 Anxiety disorder, unspecified: Secondary | ICD-10-CM | POA: Diagnosis present

## 2018-07-14 DIAGNOSIS — Z87891 Personal history of nicotine dependence: Secondary | ICD-10-CM

## 2018-07-14 DIAGNOSIS — K529 Noninfective gastroenteritis and colitis, unspecified: Secondary | ICD-10-CM | POA: Diagnosis present

## 2018-07-14 DIAGNOSIS — Z880 Allergy status to penicillin: Secondary | ICD-10-CM

## 2018-07-14 DIAGNOSIS — M810 Age-related osteoporosis without current pathological fracture: Secondary | ICD-10-CM | POA: Diagnosis present

## 2018-07-14 DIAGNOSIS — E1165 Type 2 diabetes mellitus with hyperglycemia: Secondary | ICD-10-CM | POA: Diagnosis present

## 2018-07-14 DIAGNOSIS — F329 Major depressive disorder, single episode, unspecified: Secondary | ICD-10-CM | POA: Diagnosis present

## 2018-07-14 DIAGNOSIS — Z79890 Hormone replacement therapy (postmenopausal): Secondary | ICD-10-CM

## 2018-07-14 DIAGNOSIS — Z6841 Body Mass Index (BMI) 40.0 and over, adult: Secondary | ICD-10-CM

## 2018-07-14 DIAGNOSIS — E1159 Type 2 diabetes mellitus with other circulatory complications: Secondary | ICD-10-CM | POA: Diagnosis present

## 2018-07-14 DIAGNOSIS — R109 Unspecified abdominal pain: Secondary | ICD-10-CM

## 2018-07-14 DIAGNOSIS — Z888 Allergy status to other drugs, medicaments and biological substances status: Secondary | ICD-10-CM

## 2018-07-14 DIAGNOSIS — E785 Hyperlipidemia, unspecified: Secondary | ICD-10-CM | POA: Diagnosis present

## 2018-07-14 DIAGNOSIS — Z79899 Other long term (current) drug therapy: Secondary | ICD-10-CM

## 2018-07-14 DIAGNOSIS — Z930 Tracheostomy status: Secondary | ICD-10-CM

## 2018-07-14 DIAGNOSIS — Z794 Long term (current) use of insulin: Secondary | ICD-10-CM

## 2018-07-14 LAB — URINALYSIS, ROUTINE W REFLEX MICROSCOPIC
Bilirubin Urine: NEGATIVE
GLUCOSE, UA: 50 mg/dL — AB
Ketones, ur: NEGATIVE mg/dL
NITRITE: NEGATIVE
PH: 5 (ref 5.0–8.0)
Protein, ur: 30 mg/dL — AB
Specific Gravity, Urine: 1.018 (ref 1.005–1.030)

## 2018-07-14 LAB — I-STAT BETA HCG BLOOD, ED (MC, WL, AP ONLY)

## 2018-07-14 LAB — CBC
HCT: 36.2 % (ref 36.0–46.0)
HEMOGLOBIN: 10.6 g/dL — AB (ref 12.0–15.0)
MCH: 27.1 pg (ref 26.0–34.0)
MCHC: 29.3 g/dL — ABNORMAL LOW (ref 30.0–36.0)
MCV: 92.6 fL (ref 80.0–100.0)
NRBC: 0 % (ref 0.0–0.2)
PLATELETS: 388 10*3/uL (ref 150–400)
RBC: 3.91 MIL/uL (ref 3.87–5.11)
RDW: 13.5 % (ref 11.5–15.5)
WBC: 11.3 10*3/uL — ABNORMAL HIGH (ref 4.0–10.5)

## 2018-07-14 LAB — BASIC METABOLIC PANEL
Anion gap: 9 (ref 5–15)
BUN: 16 mg/dL (ref 6–20)
CALCIUM: 8.5 mg/dL — AB (ref 8.9–10.3)
CO2: 24 mmol/L (ref 22–32)
Chloride: 103 mmol/L (ref 98–111)
Creatinine, Ser: 1.51 mg/dL — ABNORMAL HIGH (ref 0.44–1.00)
GFR calc non Af Amer: 41 mL/min — ABNORMAL LOW (ref >60)
GFR, EST AFRICAN AMERICAN: 48 mL/min — AB (ref >60)
Glucose, Bld: 265 mg/dL — ABNORMAL HIGH (ref 70–99)
Potassium: 4 mmol/L (ref 3.5–5.1)
Sodium: 136 mmol/L (ref 135–145)

## 2018-07-14 LAB — I-STAT CG4 LACTIC ACID, ED
LACTIC ACID, VENOUS: 1.35 mmol/L (ref 0.5–1.9)
Lactic Acid, Venous: 1.59 mmol/L (ref 0.5–1.9)

## 2018-07-14 LAB — CBG MONITORING, ED: Glucose-Capillary: 141 mg/dL — ABNORMAL HIGH (ref 70–99)

## 2018-07-14 MED ORDER — PANTOPRAZOLE SODIUM 40 MG PO TBEC
40.0000 mg | DELAYED_RELEASE_TABLET | Freq: Every day | ORAL | Status: DC
  Administered 2018-07-15 – 2018-07-16 (×2): 40 mg via ORAL
  Filled 2018-07-14 (×3): qty 1

## 2018-07-14 MED ORDER — SODIUM CHLORIDE 0.9 % IV BOLUS
1000.0000 mL | Freq: Once | INTRAVENOUS | Status: AC
  Administered 2018-07-14: 1000 mL via INTRAVENOUS

## 2018-07-14 MED ORDER — ATORVASTATIN CALCIUM 40 MG PO TABS
40.0000 mg | ORAL_TABLET | Freq: Every day | ORAL | Status: DC
  Administered 2018-07-14 – 2018-07-15 (×2): 40 mg via ORAL
  Filled 2018-07-14 (×2): qty 1

## 2018-07-14 MED ORDER — ACETAMINOPHEN 325 MG PO TABS: 650.0000 mg | ORAL_TABLET | Freq: Four times a day (QID) | ORAL | Status: DC | PRN

## 2018-07-14 MED ORDER — ONDANSETRON HCL 4 MG PO TABS: 4.0000 mg | ORAL_TABLET | Freq: Four times a day (QID) | ORAL | Status: DC | PRN

## 2018-07-14 MED ORDER — SODIUM CHLORIDE 0.9 % IV BOLUS
500.0000 mL | Freq: Once | INTRAVENOUS | Status: AC
  Administered 2018-07-14: 500 mL via INTRAVENOUS

## 2018-07-14 MED ORDER — HEPARIN SODIUM (PORCINE) 5000 UNIT/ML IJ SOLN
5000.0000 [IU] | Freq: Three times a day (TID) | INTRAMUSCULAR | Status: DC
  Administered 2018-07-14 – 2018-07-16 (×5): 5000 [IU] via SUBCUTANEOUS
  Filled 2018-07-14 (×6): qty 1

## 2018-07-14 MED ORDER — FLUTICASONE PROPIONATE 50 MCG/ACT NA SUSP
1.0000 | Freq: Every day | NASAL | Status: DC
  Administered 2018-07-16: 1 via NASAL
  Filled 2018-07-14 (×2): qty 16

## 2018-07-14 MED ORDER — FLUCONAZOLE 150 MG PO TABS
150.0000 mg | ORAL_TABLET | Freq: Once | ORAL | Status: AC
  Administered 2018-07-14: 150 mg via ORAL
  Filled 2018-07-14: qty 1

## 2018-07-14 MED ORDER — ACETAMINOPHEN 650 MG RE SUPP: 650.0000 mg | Freq: Four times a day (QID) | RECTAL | Status: DC | PRN

## 2018-07-14 MED ORDER — SODIUM CHLORIDE 0.9 % IV SOLN
1.0000 g | Freq: Once | INTRAVENOUS | Status: AC
  Administered 2018-07-14: 1 g via INTRAVENOUS
  Filled 2018-07-14: qty 10

## 2018-07-14 MED ORDER — INSULIN DETEMIR 100 UNIT/ML ~~LOC~~ SOLN
30.0000 [IU] | Freq: Two times a day (BID) | SUBCUTANEOUS | Status: DC
  Administered 2018-07-14 – 2018-07-16 (×4): 30 [IU] via SUBCUTANEOUS
  Filled 2018-07-14 (×6): qty 0.3

## 2018-07-14 MED ORDER — SODIUM CHLORIDE 0.9 % IV SOLN
INTRAVENOUS | Status: AC
  Administered 2018-07-14 – 2018-07-15 (×2): via INTRAVENOUS

## 2018-07-14 MED ORDER — TRAZODONE HCL 50 MG PO TABS
50.0000 mg | ORAL_TABLET | Freq: Every day | ORAL | Status: DC
  Administered 2018-07-14 – 2018-07-15 (×2): 50 mg via ORAL
  Filled 2018-07-14 (×2): qty 1

## 2018-07-14 MED ORDER — GABAPENTIN 300 MG PO CAPS
600.0000 mg | ORAL_CAPSULE | Freq: Two times a day (BID) | ORAL | Status: DC
  Administered 2018-07-14 – 2018-07-16 (×4): 600 mg via ORAL
  Filled 2018-07-14 (×4): qty 2

## 2018-07-14 MED ORDER — SODIUM CHLORIDE 0.9 % IV SOLN
1.0000 g | INTRAVENOUS | Status: DC
  Administered 2018-07-15: 1 g via INTRAVENOUS
  Filled 2018-07-14: qty 10

## 2018-07-14 MED ORDER — HYDROXYZINE HCL 50 MG PO TABS
50.0000 mg | ORAL_TABLET | Freq: Every day | ORAL | Status: DC
  Administered 2018-07-14 – 2018-07-15 (×2): 50 mg via ORAL
  Filled 2018-07-14 (×3): qty 1

## 2018-07-14 MED ORDER — ONDANSETRON HCL 4 MG/2ML IJ SOLN: 4.0000 mg | Freq: Four times a day (QID) | INTRAMUSCULAR | Status: DC | PRN

## 2018-07-14 MED ORDER — LEVOTHYROXINE SODIUM 125 MCG PO TABS
125.0000 ug | ORAL_TABLET | Freq: Every day | ORAL | Status: DC
  Administered 2018-07-15 – 2018-07-16 (×2): 125 ug via ORAL
  Filled 2018-07-14 (×4): qty 1

## 2018-07-14 MED ORDER — BUPROPION HCL ER (XL) 150 MG PO TB24
300.0000 mg | ORAL_TABLET | Freq: Every day | ORAL | Status: DC
  Administered 2018-07-15 – 2018-07-16 (×2): 300 mg via ORAL
  Filled 2018-07-14 (×3): qty 2

## 2018-07-14 MED ORDER — OXYCODONE-ACETAMINOPHEN 5-325 MG PO TABS
1.0000 | ORAL_TABLET | Freq: Once | ORAL | Status: AC
  Administered 2018-07-14: 1 via ORAL
  Filled 2018-07-14: qty 1

## 2018-07-14 MED ORDER — OXYCODONE HCL 5 MG PO TABS
5.0000 mg | ORAL_TABLET | ORAL | Status: DC | PRN
  Administered 2018-07-15: 5 mg via ORAL
  Filled 2018-07-14: qty 1

## 2018-07-14 NOTE — ED Notes (Signed)
Pt ambulated to restroom to use the bathroom.

## 2018-07-14 NOTE — H&P (Signed)
History and Physical    [name] WUJ:[id number] DOB: 06-03-1976 DOA: 07/14/2018  PCP: Sandi Mariscal, MD  Patient coming from: Home  I have personally briefly reviewed patient's old medical records in [place]  Chief Complaint: Left flank pain, oliguria  HPI: [name] is a 42 y.o. female with medical history significant for insulin-dependent type 2 diabetes, hypertension, hyperlipidemia, depression, anxiety, hypothyroidism, and morbid obesity status post tracheostomy who presents with recurrent left flank pain, nausea without emesis, and oliguria.  Patient was recently admitted from 07/05/2018-07/08/2018 for sepsis and acute renal failure secondary to UTI/pyelonephritis.  She was treated for 3 days with IV ceftriaxone and discharge on oral Keflex for 5 more days.  Patient states that at home her left flank pain returned and worsened despite oral antibiotics.  She had decreased appetite with nausea but no emesis.  She has also noticed that her urinary frequency has decreased and that she has not urinated within the last 24 hours.  She says she saw her PCP at [facility] who prescribed Zofran for nausea but he was concerned about potential urinary obstruction.  She has been taking ibuprofen 600 mg about once every morning for her pain.  Patient denies any chest pain, dyspnea, fevers, diaphoresis.  She reports chronic diarrhea which is unchanged from her baseline.  ED Course:  BP 100/63, pulse 100, RR 18, temp 98.61F, spO2 94% on room air. Lab work was notable for increased creatinine 1.51 compared to 1.09 on 07/07/2018.  WBC 11.3.  Urinalysis showed moderate leukocytes, negative nitrites, rare bacteria, budding yeast on microscopy.  I-STAT beta-hCG is <5.0.  Lactic acid 1.35.  CT renal stone study showed a right lower pole tiny calcification which could be vascular or nonobstructing renal calculi.  No obstructing stone or hydronephrosis seen.  Urine and blood cultures were  collected.  Given 1.5 L fluids and started on ceftriaxone.  Bladder scan showed 266 mLs in the bladder.  Hospitalist service was consulted to admit for AKI with oliguria.  Review of Systems: As per HPI otherwise 10 point review of systems negative.    Past Medical History:  Diagnosis Date  . Anxiety   . Complication of anesthesia    Pt. states takes long time to wake up from it.   . Cushing's disease ([place])   . Depression   . Diabetes ([place])   . Hyperlipidemia   . Hyperlipidemia   . Hypertension   . Morbid obesity ([place])   . Osteoporosis 07/19/2015  . Periprosthetic fracture 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[place]), R tibial plateau  07/18/2015  . Sleep apnea   . Uncontrolled diabetes mellitus with diabetic neuropathy, with long-term current use of insulin ([place]) 07/16/2015  . Vitamin D deficiency 07/19/2015    Past Surgical History:  Procedure Laterality Date  . ANTERIOR TALOFIBULAR LIGAMENT REPAIR Left 11/15/2014   Procedure: ANTERIOR TALOFIBULAR LIGAMENT REPAIR;  Surgeon: Jana Half, DPM;  Location: [place];  Service: Podiatry;  Laterality: Left;  . CESAREAN SECTION  dec 1997/  06-03-2001/   01-01-2005   BILATERAL TUBAL LIGATION WITH LAST ONE  . DILATION AND CURETTAGE OF UTERUS  1995   WITH SUCTION  . DIRECT LARYNGOSCOPY N/A 12/18/2017   Procedure: DIRECT LARYNGOSCOPY;  Surgeon: Izora Gala, MD;  Location: [facility];  Service: ENT;  Laterality: N/A;  . ESOPHAGOGASTRODUODENOSCOPY (EGD) WITH PROPOFOL N/A 10/04/2016   Procedure: ESOPHAGOGASTRODUODENOSCOPY (EGD) WITH PROPOFOL;  Surgeon: [name], MD;  Location: [facility];  Service: Endoscopy;  Laterality: N/A;  . LAPAROSCOPIC CHOLECYSTECTOMY  09-25-2005  . ORIF TIBIA PLATEAU Right 07/19/2015   Procedure: OPEN REDUCTION INTERNAL FIXATION (ORIF) RIGHT TIBIAL PLATEAU;  Surgeon: Altamese [place], MD;  Location: [facility];  Service: Orthopedics;  Laterality: Right;  . PARTIAL KNEE ARTHROPLASTY Right 04/19/2014   Procedure:  RIGHT UNI KNEE ARTHROPLASTY MEDIALLY ;  Surgeon: Mauri Pole, MD;  Location: [facility];  Service: Orthopedics;  Laterality: Right;  . TRACHEOSTOMY TUBE PLACEMENT N/A 11/28/2017   Procedure: TRACHEOSTOMY;  Surgeon: Izora Gala, MD;  Location: [facility];  Service: ENT;  Laterality: N/A;  . TUBAL LIGATION       reports that she quit smoking about 13 months ago. She quit after 4.00 years of use. She has never used smokeless tobacco. She reports that she drinks alcohol. She reports that she does not use drugs.  Allergies  Allergen Reactions  . Bactrim [Sulfamethoxazole-Trimethoprim] Rash  . Penicillins Hives and Other (See Comments)    Tolerated zosyn 12/12/17 Has patient had a PCN reaction causing immediate rash, facial/tongue/throat swelling, SOB or lightheadedness with hypotension: No Has patient had a PCN reaction causing severe rash involving mucus membranes or skin necrosis: Yes Has patient had a PCN reaction that required hospitalization: Yes Has patient had a PCN reaction occurring within the last 10 years: No If all of the above answers are "NO", then may proceed with Cephalosporin use.    Family History  Adopted: Yes  Problem Relation Age of Onset  . Autism Son   . ADD / ADHD Son   . Apraxia Son      Prior to Admission medications   Medication Sig Start Date End Date Taking? Authorizing Provider  atorvastatin (LIPITOR) 20 MG tablet Take 20 mg by mouth at bedtime.    [provider]  buPROPion (WELLBUTRIN SR) 100 MG 12 hr tablet Take 100 mg by mouth 2 (two) times daily.    [provider]  cephALEXin (KEFLEX) 500 MG capsule Take 1 capsule (500 mg total) by mouth 3 (three) times daily for 10 days. 07/08/18 07/18/18  Thurnell Lose, MD  cholestyramine (QUESTRAN) 4 g packet Take 1 packet (4 g total) by mouth every other day. Patient not taking: Reported on 07/05/2018 01/08/18   Nita Sells, MD  fluticasone [facility]) 50 MCG/ACT nasal spray Place 2 sprays into  both nostrils daily. 04/14/18 04/14/19  Erick Colace, NP  gabapentin (NEURONTIN) 600 MG tablet Take 600 mg by mouth 2 (two) times daily.     [provider]  ibuprofen (ADVIL,MOTRIN) 600 MG tablet Take 1 tablet (600 mg total) by mouth every 6 (six) hours as needed. 06/28/18   Varney Biles, MD  Insulin Detemir (LEVEMIR FLEXTOUCH) 100 UNIT/ML Pen Inject 50 Units into the skin 2 (two) times daily. 06/09/18   [provider]  levothyroxine (SYNTHROID, LEVOTHROID) 75 MCG tablet Take 1 tablet (75 mcg total) by mouth daily before breakfast. 01/07/18   Nita Sells, MD  lisinopril (PRINIVIL,ZESTRIL) 2.5 MG tablet Take 2.5 mg by mouth daily.    [provider]  metFORMIN (GLUCOPHAGE) 1000 MG tablet Take 1,000 mg by mouth 2 (two) times daily with a meal.    [provider]  metoprolol tartrate (LOPRESSOR) 25 MG tablet Take 0.5 tablets (12.5 mg total) by mouth 2 (two) times daily. 01/06/18   Nita Sells, MD  naproxen (NAPROSYN) 500 MG tablet Take 1 tablet (500 mg total) by mouth 2 (two) times daily. Patient not taking: Reported  on 07/05/2018 10/31/16   Tanna Furry, MD  pantoprazole (PROTONIX) 40 MG tablet TAKE ONE TABLET BY MOUTH TWICE A DAY BEFORE MEALS Patient taking differently: Take 40 mg by mouth daily.  04/01/17   [name], MD  pioglitazone (ACTOS) 15 MG tablet Take 15 mg by mouth daily.    [provider]  quinapril (ACCUPRIL) 5 MG tablet Take 2.5 mg by mouth every morning.     [provider]  traZODone (DESYREL) 50 MG tablet Take 50 mg by mouth at bedtime.    [provider]  venlafaxine XR (EFFEXOR-XR) 75 MG 24 hr capsule Take 75 mg by mouth daily.    [provider]    Physical Exam: Vitals:   07/14/18 1845 07/14/18 1900 07/14/18 1915 07/14/18 1935  BP:    122/75  Pulse: (!) 103 (!) 103 97 (!) 103  Resp: (!) 25 16 14  (!) 22  Temp:      TempSrc:      SpO2: 95% 96% 92% 99%    Constitutional: NAD,  calm, comfortable Eyes: PERRL, lids and conjunctivae normal ENMT: Mucous membranes are moist. Posterior pharynx clear of any exudate or lesions.Normal dentition.  Neck:  Tracheostomy in place, appears clean. Respiratory: clear to auscultation bilaterally, no wheezing, no crackles. Normal respiratory effort. No accessory muscle use.  Cardiovascular: Regular rate and rhythm, no murmurs / rubs / gallops. No extremity edema. 2+ pedal pulses.  Abdomen: She is tender to palpation the left upper quadrant and left flank, she exhibits guarding. No hepatosplenomegaly. Bowel sounds positive.  Musculoskeletal: no clubbing / cyanosis. No joint deformity upper and lower extremities. Good ROM, no contractures. Normal muscle tone.  Skin: no rashes, lesions, ulcers. No induration Neurologic: CN 2-12 grossly intact. Sensation intact, DTR normal. Strength 5/5 in all 4.  Psychiatric: Normal judgment and insight. Alert and oriented x 3.  Fluctuating mood, at times anxious and depressed.   Labs on Admission: I have personally reviewed following labs and imaging studies  CBC: Recent Labs  Lab 07/14/18 1356  WBC 11.3*  HGB 10.6*  HCT 36.2  MCV 92.6  PLT 397   Basic Metabolic Panel: Recent Labs  Lab 07/14/18 1356  NA 136  K 4.0  CL 103  CO2 24  GLUCOSE 265*  BUN 16  CREATININE 1.51*  CALCIUM 8.5*   GFR: CrCl cannot be calculated (Unknown ideal weight.). Liver Function Tests: No results for input(s): AST, ALT, ALKPHOS, BILITOT, PROT, ALBUMIN in the last 168 hours. No results for input(s): LIPASE, AMYLASE in the last 168 hours. No results for input(s): AMMONIA in the last 168 hours. Coagulation Profile: No results for input(s): INR, PROTIME in the last 168 hours. Cardiac Enzymes: No results for input(s): CKTOTAL, CKMB, CKMBINDEX, TROPONINI in the last 168 hours. BNP (last 3 results) No results for input(s): PROBNP in the last 8760 hours. HbA1C: No results for input(s): HGBA1C in the last 72  hours. CBG: Recent Labs  Lab 07/08/18 1038  GLUCAP 142*   Lipid Profile: No results for input(s): CHOL, HDL, LDLCALC, TRIG, CHOLHDL, LDLDIRECT in the last 72 hours. Thyroid Function Tests: No results for input(s): TSH, T4TOTAL, FREET4, T3FREE, THYROIDAB in the last 72 hours. Anemia Panel: No results for input(s): VITAMINB12, FOLATE, FERRITIN, TIBC, IRON, RETICCTPCT in the last 72 hours. Urine analysis:    Component Value Date/Time   COLORURINE YELLOW 07/14/2018 1740   APPEARANCEUR CLOUDY (A) 07/14/2018 1740   LABSPEC 1.018 07/14/2018 1740   PHURINE 5.0 07/14/2018 1740  GLUCOSEU 50 (A) 07/14/2018 1740   HGBUR MODERATE (A) 07/14/2018 1740   BILIRUBINUR NEGATIVE 07/14/2018 1740   KETONESUR NEGATIVE 07/14/2018 1740   PROTEINUR 30 (A) 07/14/2018 1740   UROBILINOGEN 0.2 04/09/2014 1157   NITRITE NEGATIVE 07/14/2018 1740   LEUKOCYTESUR MODERATE (A) 07/14/2018 1740    Radiological Exams on Admission: Dg Chest 2 View  Result Date: 07/14/2018 CLINICAL DATA:  Sepsis EXAM: CHEST - 2 VIEW COMPARISON:  07/05/2018, 06/28/2017 FINDINGS: Partially visualized tracheal tube with the tip about 4.4 cm superior to the carina. No focal airspace disease or pleural effusion. Borderline cardiomegaly. No pneumothorax. IMPRESSION: No active cardiopulmonary disease. Electronically Signed   By: Donavan Foil M.D.   On: 07/14/2018 15:52   Ct Renal Stone Study  Result Date: 07/14/2018 CLINICAL DATA:  43 year old female with left flank pain and decreased urination. Admitted last week for pyelonephritis receiving IV antibiotics and discharged on oral antibiotics. Subsequent encounter. EXAM: CT ABDOMEN AND PELVIS WITHOUT CONTRAST TECHNIQUE: Multidetector CT imaging of the abdomen and pelvis was performed following the standard protocol without IV contrast. COMPARISON:  06/28/2018 pelvic CT. 10/31/2017 abdominal and pelvic CT. FINDINGS: Lower chest: Scarring/subsegmental atelectasis lung bases. Heart size  top-normal. Trace pericardial fluid. Hepatobiliary: Elongated liver spanning over 22.5 cm. Taking into account limitation by non contrast imaging, no worrisome hepatic lesion. Post cholecystectomy. Pancreas: Taking into account limitation by non contrast imaging, no worrisome pancreatic lesion or inflammation. Spleen: Taking into account limitation by non contrast imaging, no splenic mass or enlargement. Adrenals/Urinary Tract: No obstructing stone or hydronephrosis. Right lower pole tiny calcification may represent vascular calcification or nonobstructing tiny renal calculi. Taking into account limitation by non contrast imaging, no worrisome renal or adrenal abnormality. Urinary bladder decompressed and evaluation limited. Stomach/Bowel: No extraluminal bowel inflammatory process is noted. Portions of the colon, stomach and small bowel are under distended limiting evaluation for detection of a mass. Vascular/Lymphatic: No abdominal aortic aneurysm. Mild calcified plaque iliac arteries. Scattered normal size lymph nodes. Reproductive: No worrisome adnexal or uterine mass noted. Other: No free air or bowel containing hernia. Small fat and vessel containing hernias. Musculoskeletal: Mild sacroiliac joint degenerative changes. L5-S1 facet degenerative changes. Minimal degenerative changes lower lumbar spine. No osseous destructive lesion. IMPRESSION: 1. No obstructing stone or hydronephrosis. 2. Right lower pole tiny calcification may be a vascular calcification or nonobstructing tiny right renal calculi. 3. No extraluminal bowel inflammatory process noted. 4. Post cholecystectomy. 5. Elongated liver. 6. Mild calcified plaque iliac arteries. 7. Small fat and vessel containing anterior abdominal/pelvic wall hernias. No bowel containing hernia. Electronically Signed   By: Genia Del M.D.   On: 07/14/2018 15:53    EKG: Not obtained.  Assessment/Plan Principal Problem:   UTI (urinary tract infection) Active  Problems:   Hyperlipidemia associated with type 2 diabetes mellitus (HCC)   Hypothyroid   Morbid obesity due to excess calories ([place])   Uncontrolled diabetes mellitus with diabetic neuropathy, with long-term current use of insulin ([place])   Hypertension associated with diabetes ([place])   Depression   Tracheostomy dependence ([place])   AKI (acute kidney injury) ([place])    [name] is a 42 y.o. female with medical history significant for insulin-dependent type 2 diabetes, hypertension, hyperlipidemia, depression, anxiety, hypothyroidism, and morbid obesity status post tracheostomy who presents with recurrent left flank pain, nausea without emesis, and oliguria.   UTI: Recently admitted and treated for UTI related sepsis and acute renal failure.  No urine culture on file from prior admission. Would  expect symptoms to have resolved by now, however it is possible she had a drug resistant UTI. Will keep on Ceftriaxone for now and f/u urine culture.  AKI with oliguria: Potentially secondary to decreased oral intake, UTI, and NSAID use. Will challenge with IV fluids overnight and monitor renal function, I/Os. Bladder scan as needed. Hold nephrotoxic agents.  Insulin-dependent diabetes: On levemir 50 units BID, metformin, pioglitazone at home.  -lower dose basal insulin, increase if needed  Hypertension: Controlled here. Holding home low dose Lisinopril with AKI.  Tracheostomy: Chronic and stable. Trach care and aerosolized trach collar at night.   Hyperlipidemia: Continue Atorvastatin  Hypothyroidism: Continue Synthroid  Depression: Continue Bupropion  DVT prophylaxis: Heparin Code Status: Full code Family Communication: Husband at bedside. Disposition Plan: Pending response to IV abx and urine culture result.  Consults called: none  Admission status: observation    Zada Finders MD [facility] Pager [phone]  If 7PM-7AM, please contact  night-coverage [email or web address] Password [email or web address]  07/14/2018, 9:21 PM

## 2018-07-14 NOTE — ED Notes (Signed)
Bladder scan volume - 250ml

## 2018-07-14 NOTE — ED Notes (Signed)
PT in CT, will return for blood draw

## 2018-07-14 NOTE — ED Provider Notes (Signed)
[facility] EMERGENCY DEPARTMENT Provider Note   CSN: [id number] Arrival date & time: 07/14/18  1313     History   Chief Complaint Chief Complaint  Patient presents with  . Flank Pain    HPI [name] is a 42 y.o. female.  [name] is a 42 y.o. Female with a history of hypertension, hyperlipidemia, diabetes, Cushing's disease and trach in place after drug overdose, who presents to the emergency department for evaluation of worsening left flank pain and decreased urination.  Patient was admitted to the hospital last week on 10/26 for pyelonephritis and was treated with 3 days of IV antibiotics, initially started on aztreonam but then transition to Rocephin and patient seemed to improve, was discharged on 5 additional days of p.o. Keflex but patient reports since being at home her flank pain has worsened she is felt extremely nauseated without vomiting and has had decreased appetite, flank pain is worsening particularly on the left and she also reports mild diffuse abdominal pain.  She reports she has not urinated within the last 24 hours.  She saw her PCP today who is concerned for potential urinary obstruction given decreased urination.  Patient denies fevers but reports persistent chills.  She has not had any chest pain or shortness of breath, no cough out of the ordinary for her, she has a chronic cough at baseline due to trach which was placed last March after an overdose, no vent use at this time.  No diarrhea, melena or hematochezia.  Chart review shows no results from urine culture during previous admission and there was no CT or ultrasound imaging done during admission.      Past Medical History:  Diagnosis Date  . Anxiety   . Complication of anesthesia    Pt. states takes long time to wake up from it.   . Cushing's disease ([place])   . Depression   . Diabetes ([place])   . Hyperlipidemia   . Hyperlipidemia   . Hypertension   . Morbid obesity ([place])     . Osteoporosis 07/19/2015  . Periprosthetic fracture 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[place]), R tibial plateau  07/18/2015  . Sleep apnea   . Uncontrolled diabetes mellitus with diabetic neuropathy, with long-term current use of insulin ([place]) 07/16/2015  . Vitamin D deficiency 07/19/2015    Patient Active Problem List   Diagnosis Date Noted  . UTI (urinary tract infection) 07/14/2018  . Sepsis secondary to UTI ([place]) 07/05/2018  . AKI (acute kidney injury) ([place]) 07/05/2018  . Allergic reaction caused by a drug 07/05/2018  . Tracheostomy complication ([place])   . Tracheostomy dependence ([place])   . Obstructive sleep apnea syndrome   . MDD (major depressive disorder), recurrent episode, moderate ([place])   . Acute respiratory failure with hypoxemia ([place])   . SOB (shortness of breath)   . Tracheostomy status ([place])   . Hoarse voice quality   . Hypomagnesemia   . Shortness of breath   . Major depressive disorder, recurrent episode, severe ([place])   . Intentional overdose of drug in tablet form ([place]) 11/20/2017  . Intentional drug overdose ([place])   . Seizure ([employer])   . Acute respiratory failure with hypercapnia ([place])   . Status epilepticus ([place])   . Spondylosis without myelopathy or radiculopathy, cervical region 10/29/2017  . Abdominal pain, epigastric   . LUQ abdominal pain   . Nausea and vomiting   . Gastroparesis   . Hypertriglyceridemia 07/22/2015  . Diabetes mellitus due  to underlying condition, uncontrolled, with diabetic neuropathy, with long-term current use of insulin ([place])   . Vitamin D deficiency 07/19/2015  . 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 Osteoporosis 07/19/2015  . Periprosthetic fracture 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[place]), R tibial plateau  07/18/2015  . Fracture, tibial plateau 07/16/2015  . Uncontrolled diabetes mellitus with diabetic neuropathy, with long-term current use of insulin ([place]) 07/16/2015  . Leukocytosis  07/16/2015  . Essential hypertension 07/16/2015  . Morbid obesity due to excess calories ([place]) 04/20/2014  . Hyperlipidemia 07/31/2012  . Cushing disease ([place]) 07/31/2012  . Hypothyroid 07/31/2012    Past Surgical History:  Procedure Laterality Date  . ANTERIOR TALOFIBULAR LIGAMENT REPAIR Left 11/15/2014   Procedure: ANTERIOR TALOFIBULAR LIGAMENT REPAIR;  Surgeon: Jana Half, DPM;  Location: [facility];  Service: Podiatry;  Laterality: Left;  . CESAREAN SECTION  dec 1997/  06-03-2001/   01-01-2005   BILATERAL TUBAL LIGATION WITH LAST ONE  . DILATION AND CURETTAGE OF UTERUS  1995   WITH SUCTION  . DIRECT LARYNGOSCOPY N/A 12/18/2017   Procedure: DIRECT LARYNGOSCOPY;  Surgeon: Izora Gala, MD;  Location: [facility];  Service: ENT;  Laterality: N/A;  . ESOPHAGOGASTRODUODENOSCOPY (EGD) WITH PROPOFOL N/A 10/04/2016   Procedure: ESOPHAGOGASTRODUODENOSCOPY (EGD) WITH PROPOFOL;  Surgeon: [name], MD;  [facility];  Service: Endoscopy;  Laterality: N/A;  . LAPAROSCOPIC CHOLECYSTECTOMY  09-25-2005  . ORIF TIBIA PLATEAU Right 07/19/2015   Procedure: OPEN REDUCTION INTERNAL FIXATION (ORIF) RIGHT TIBIAL PLATEAU;  Surgeon: Altamese [place], MD;  Location: [facility];  Service: Orthopedics;  Laterality: Right;  . PARTIAL KNEE ARTHROPLASTY Right 04/19/2014   Procedure: RIGHT UNI KNEE ARTHROPLASTY MEDIALLY ;  Surgeon: Mauri Pole, MD;  Location: [facility];  Service: Orthopedics;  Laterality: Right;  . TRACHEOSTOMY TUBE PLACEMENT N/A 11/28/2017   Procedure: TRACHEOSTOMY;  Surgeon: Izora Gala, MD;  Location: [facility];  Service: ENT;  Laterality: N/A;  . TUBAL LIGATION       OB History   None      Home Medications    Prior to Admission medications   Medication Sig Start Date End Date Taking? Authorizing Provider  atorvastatin (LIPITOR) 20 MG tablet Take 20 mg by mouth at bedtime.    [provider]  buPROPion (WELLBUTRIN SR) 100 MG 12 hr tablet Take 100 mg by mouth 2  (two) times daily.    [provider]  cephALEXin (KEFLEX) 500 MG capsule Take 1 capsule (500 mg total) by mouth 3 (three) times daily for 10 days. 07/08/18 07/18/18  Thurnell Lose, MD  cholestyramine (QUESTRAN) 4 g packet Take 1 packet (4 g total) by mouth every other day. Patient not taking: Reported on 07/05/2018 01/08/18   Nita Sells, MD  fluticasone [facility]) 50 MCG/ACT nasal spray Place 2 sprays into both nostrils daily. 04/14/18 04/14/19  Erick Colace, NP  gabapentin (NEURONTIN) 600 MG tablet Take 600 mg by mouth 2 (two) times daily.     [provider]  ibuprofen (ADVIL,MOTRIN) 600 MG tablet Take 1 tablet (600 mg total) by mouth every 6 (six) hours as needed. 06/28/18   Varney Biles, MD  Insulin Detemir (LEVEMIR FLEXTOUCH) 100 UNIT/ML Pen Inject 50 Units into the skin 2 (two) times daily. 06/09/18   [provider]  levothyroxine (SYNTHROID, LEVOTHROID) 75 MCG tablet Take 1 tablet (75 mcg total) by mouth daily before breakfast. 01/07/18   Nita Sells, MD  lisinopril (PRINIVIL,ZESTRIL) 2.5  MG tablet Take 2.5 mg by mouth daily.    [provider]  metFORMIN (GLUCOPHAGE) 1000 MG tablet Take 1,000 mg by mouth 2 (two) times daily with a meal.    [provider]  metoprolol tartrate (LOPRESSOR) 25 MG tablet Take 0.5 tablets (12.5 mg total) by mouth 2 (two) times daily. 01/06/18   Nita Sells, MD  naproxen (NAPROSYN) 500 MG tablet Take 1 tablet (500 mg total) by mouth 2 (two) times daily. Patient not taking: Reported on 07/05/2018 10/31/16   Tanna Furry, MD  pantoprazole (PROTONIX) 40 MG tablet TAKE ONE TABLET BY MOUTH TWICE A DAY BEFORE MEALS Patient taking differently: Take 40 mg by mouth daily.  04/01/17   [name], MD  pioglitazone (ACTOS) 15 MG tablet Take 15 mg by mouth daily.    [provider]  quinapril (ACCUPRIL) 5 MG tablet Take 2.5 mg by mouth every morning.     [provider]  traZODone  (DESYREL) 50 MG tablet Take 50 mg by mouth at bedtime.    [provider]  venlafaxine XR (EFFEXOR-XR) 75 MG 24 hr capsule Take 75 mg by mouth daily.    [provider]    Family History Family History  Adopted: Yes  Problem Relation Age of Onset  . Autism Son   . ADD / ADHD Son   . Apraxia Son     Social History Social History   Tobacco Use  . Smoking status: Former Smoker    Years: 4.00    Last attempt to quit: 05/22/2017    Years since quitting: 1.1  . Smokeless tobacco: Never Used  Substance Use Topics  . Alcohol use: Yes    Comment: RARE  . Drug use: No    Types: Marijuana     Allergies   Bactrim [sulfamethoxazole-trimethoprim] and Penicillins   Review of Systems Review of Systems  Constitutional: Positive for appetite change and chills. Negative for fever.  HENT: Negative.   Eyes: Negative for visual disturbance.  Respiratory: Negative for cough, chest tightness, shortness of breath and wheezing.   Cardiovascular: Negative for chest pain.  Gastrointestinal: Positive for abdominal pain and nausea. Negative for blood in stool, constipation, diarrhea and vomiting.  Genitourinary: Positive for decreased urine volume, difficulty urinating and flank pain.  Musculoskeletal: Positive for back pain. Negative for arthralgias, joint swelling and myalgias.  Skin: Negative for color change and rash.  Neurological: Negative for dizziness, syncope and light-headedness.     Physical Exam Updated Vital Signs BP 100/64   Pulse (!) 105   Temp 98.6 F (37 C) (Oral)   Resp 15   SpO2 99%   Physical Exam  Constitutional: She appears well-developed and well-nourished. No distress.  Patient appears uncomfortable but is non-toxic appearing  HENT:  Head: Normocephalic and atraumatic.  Mouth/Throat: Oropharynx is clear and moist.  Eyes: Right eye exhibits no discharge. Left eye exhibits no discharge.  Neck: Neck supple.  Trach in place with valve, no  discharge  Cardiovascular: Regular rhythm, normal heart sounds and intact distal pulses.  Mild tachycardia  Pulmonary/Chest: Effort normal and breath sounds normal. No respiratory distress.  Respirations equal and unlabored, patient able to speak in full sentences, lungs clear to auscultation bilaterally  Abdominal: Soft. Bowel sounds are normal. She exhibits no distension and no mass. There is tenderness. There is no guarding.  Abdomen soft, nondistended, bowel sounds present throughout, there is mild diffuse tenderness throughout the abdomen without focal guarding, bilateral CVA tenderness worse  on the left than the right.  Neurological: She is alert. Coordination normal.  Skin: Skin is warm and dry. Capillary refill takes less than 2 seconds. She is not diaphoretic.  Psychiatric: She has a normal mood and affect. Her behavior is normal.  Nursing note and vitals reviewed.    ED Treatments / Results  Labs (all labs ordered are listed, but only abnormal results are displayed) Labs Reviewed  URINALYSIS, ROUTINE W REFLEX MICROSCOPIC - Abnormal; Notable for the following components:      Result Value   APPearance CLOUDY (*)    Glucose, UA 50 (*)    Hgb urine dipstick MODERATE (*)    Protein, ur 30 (*)    Leukocytes, UA MODERATE (*)    WBC, UA >50 (*)    Bacteria, UA RARE (*)    All other components within normal limits  BASIC METABOLIC PANEL - Abnormal; Notable for the following components:   Glucose, Bld 265 (*)    Creatinine, Ser 1.51 (*)    Calcium 8.5 (*)    GFR calc non Af Amer 41 (*)    GFR calc Af Amer 48 (*)    All other components within normal limits  CBC - Abnormal; Notable for the following components:   WBC 11.3 (*)    Hemoglobin 10.6 (*)    MCHC 29.3 (*)    All other components within normal limits  URINE CULTURE  CULTURE, BLOOD (ROUTINE X 2)  CULTURE, BLOOD (ROUTINE X 2)  I-STAT BETA HCG BLOOD, ED ([facility], [facility], [facility] ONLY)  I-STAT CG4 LACTIC ACID, ED  I-STAT CG4  LACTIC ACID, ED    EKG None  Radiology Dg Chest 2 View  Result Date: 07/14/2018 CLINICAL DATA:  Sepsis EXAM: CHEST - 2 VIEW COMPARISON:  07/05/2018, 06/28/2017 FINDINGS: Partially visualized tracheal tube with the tip about 4.4 cm superior to the carina. No focal airspace disease or pleural effusion. Borderline cardiomegaly. No pneumothorax. IMPRESSION: No active cardiopulmonary disease. Electronically Signed   By: Donavan Foil M.D.   On: 07/14/2018 15:52   Ct Renal Stone Study  Result Date: 07/14/2018 CLINICAL DATA:  42 year old female with left flank pain and decreased urination. Admitted last week for pyelonephritis receiving IV antibiotics and discharged on oral antibiotics. Subsequent encounter. EXAM: CT ABDOMEN AND PELVIS WITHOUT CONTRAST TECHNIQUE: Multidetector CT imaging of the abdomen and pelvis was performed following the standard protocol without IV contrast. COMPARISON:  06/28/2018 pelvic CT. 10/31/2017 abdominal and pelvic CT. FINDINGS: Lower chest: Scarring/subsegmental atelectasis lung bases. Heart size top-normal. Trace pericardial fluid. Hepatobiliary: Elongated liver spanning over 22.5 cm. Taking into account limitation by non contrast imaging, no worrisome hepatic lesion. Post cholecystectomy. Pancreas: Taking into account limitation by non contrast imaging, no worrisome pancreatic lesion or inflammation. Spleen: Taking into account limitation by non contrast imaging, no splenic mass or enlargement. Adrenals/Urinary Tract: No obstructing stone or hydronephrosis. Right lower pole tiny calcification may represent vascular calcification or nonobstructing tiny renal calculi. Taking into account limitation by non contrast imaging, no worrisome renal or adrenal abnormality. Urinary bladder decompressed and evaluation limited. Stomach/Bowel: No extraluminal bowel inflammatory process is noted. Portions of the colon, stomach and small bowel are under distended limiting evaluation for  detection of a mass. Vascular/Lymphatic: No abdominal aortic aneurysm. Mild calcified plaque iliac arteries. Scattered normal size lymph nodes. Reproductive: No worrisome adnexal or uterine mass noted. Other: No free air or bowel containing hernia. Small fat and vessel containing hernias. Musculoskeletal: Mild sacroiliac joint degenerative changes. L5-S1  facet degenerative changes. Minimal degenerative changes lower lumbar spine. No osseous destructive lesion. IMPRESSION: 1. No obstructing stone or hydronephrosis. 2. Right lower pole tiny calcification may be a vascular calcification or nonobstructing tiny right renal calculi. 3. No extraluminal bowel inflammatory process noted. 4. Post cholecystectomy. 5. Elongated liver. 6. Mild calcified plaque iliac arteries. 7. Small fat and vessel containing anterior abdominal/pelvic wall hernias. No bowel containing hernia. Electronically Signed   By: Genia Del M.D.   On: 07/14/2018 15:53    Procedures Procedures (including critical care time)  Medications Ordered in ED Medications  fluconazole (DIFLUCAN) tablet 150 mg (has no administration in time range)  oxyCODONE-acetaminophen (PERCOCET/ROXICET) 5-325 MG per tablet 1 tablet (has no administration in time range)  sodium chloride 0.9 % bolus 500 mL (0 mLs Intravenous Stopped 07/14/18 1835)  cefTRIAXone (ROCEPHIN) 1 g in sodium chloride 0.9 % 100 mL IVPB (0 g Intravenous Stopped 07/14/18 1741)  sodium chloride 0.9 % bolus 1,000 mL (0 mLs Intravenous Stopped 07/14/18 1835)     Initial Impression / Assessment and Plan / ED Course  I have reviewed the triage vital signs and the nursing notes.  Pertinent labs & imaging results that were available during my care of the patient were reviewed by me and considered in my medical decision making (see chart for details).  Patient presents to the emergency department for evaluation of worsening flank pain, decreased urination and nausea.  She was admitted to the  hospital for pyelonephritis last week and after 3 days of IV antibiotics was discharged on p.o. Keflex.  Reports while in the hospital she was feeling better but since being home her symptoms have progressively worsened.  Worsening flank pain on the left as well as some mild generalized abdominal pain.  She was seen by her PCP and reported no urination in the last 24 hours and was sent to the emergency department for concern for potential obstructive uropathy.  On exam patient is afebrile, BP of 100/63 which is slightly lower than usual for her, I think this is likely in the setting of dehydration.  Patient is not ill-appearing.  Mild diffuse tenderness over the abdomen without focal guarding and significant left CVA tenderness.  Lab evaluation initiated from triage by quick look provider with basic lab work, urinalysis and CT abdomen pelvis with contrast.  Labs have returned and show that patient's creatinine is elevated once again, when she was initially admitted last week her creatinine was 2.74 but had decreased to 1.09 over the course of her hospitalization but today is elevated again at 1.51.  Given worsening kidney function will opt for CT renal stone study that does not require contrast.  Patient's white blood cell count is also elevated since discharge and is now 11.3, stable hemoglobin.  No other acute electrolyte derangements aside from an 8 glucose of 265, no anion gap.  Urinalysis returned and continues to show significant infection with moderate leukocytes, greater than 50 WBCs, rare bacteria, there is also some evidence of budding yeast, I favor this being more likely due to a vaginal yeast infection as patient has been on multiple antibiotics recently, will treat with Diflucan.  Urine culture and blood cultures obtained as well as lactic acid which was not significantly elevated at 1.59.  Will give IV fluids and Rocephin as patient seems to be clinically improving while on Rocephin in the  hospital.  CT shows no evidence of obstructive uropathy, there is a small calcification in the right lower renal  pole which could represent a tiny nonobstructing calculi versus avascular calculus.  Known hernia appears stable with no evidence of obstruction, does not appear to contain any bowel.  Given signs of persisting infection will admit to hospitalist service for continued IV antibiotics, pain control and fluid hydration.  Case discussed with Dr. Posey Pronto with [facility] hospitalist who will see and admit the patient.  Final Clinical Impressions(s) / ED Diagnoses   Final diagnoses:  Pyelonephritis  Left flank pain    ED Discharge Orders    None       Jacqlyn Larsen, [place] 07/14/18 1912    Valarie Merino, MD 07/18/18 [phone]

## 2018-07-14 NOTE — ED Triage Notes (Signed)
Pt to ER for evaluation of persistent left flank pain without urinating in the last 24 hours. States was here recently with pyelo and treated with IV antibiotics. States saw PCP yesterday and was told they were concerned for a blocked stone.

## 2018-07-14 NOTE — ED Provider Notes (Signed)
Patient placed in Quick Look pathway, seen and evaluated   Chief Complaint: flank pain  HPI: [name] is a 42 y.o. female who presents to the ED with left flank pain and decreased urination. Patient was admitted last week for pyelo and had IV antibiotics and d/c home with oral antibiotics. Patient reports symptoms have worsened since d/c home. patient saw PCP and there was concern about possible block and/or stone and told to come to the ED.   ROS: GI: abdominal pain  GU: UTI symptoms  Physical Exam:  BP 100/63 (BP Location: Right Arm)   Pulse 100   Temp 98.6 F (37 C) (Oral)   Resp 18   SpO2 94%    Gen: No distress  Neuro: Awake and Alert  Skin: Warm anddry  Abdomen: obese, left CVAT   Initiation of care has begun. The patient has been counseled on the process, plan, and necessity for staying for the completion/evaluation, and the remainder of the medical screening examination    Ashley Murrain, NP 07/14/18 [place], Kevin, MD 07/15/18 [phone]

## 2018-07-15 ENCOUNTER — Ambulatory Visit (HOSPITAL_BASED_OUTPATIENT_CLINIC_OR_DEPARTMENT_OTHER): Payer: Medicaid Other

## 2018-07-15 ENCOUNTER — Encounter (HOSPITAL_COMMUNITY): Payer: Self-pay | Admitting: Emergency Medicine

## 2018-07-15 ENCOUNTER — Other Ambulatory Visit: Payer: Self-pay

## 2018-07-15 ENCOUNTER — Encounter

## 2018-07-15 ENCOUNTER — Ambulatory Visit: Payer: Self-pay | Admitting: Gastroenterology

## 2018-07-15 DIAGNOSIS — R52 Pain, unspecified: Secondary | ICD-10-CM

## 2018-07-15 DIAGNOSIS — R609 Edema, unspecified: Secondary | ICD-10-CM

## 2018-07-15 DIAGNOSIS — N179 Acute kidney failure, unspecified: Secondary | ICD-10-CM | POA: Diagnosis not present

## 2018-07-15 DIAGNOSIS — N390 Urinary tract infection, site not specified: Secondary | ICD-10-CM | POA: Diagnosis not present

## 2018-07-15 LAB — BASIC METABOLIC PANEL
Anion gap: 4 — ABNORMAL LOW (ref 5–15)
BUN: 13 mg/dL (ref 6–20)
CHLORIDE: 110 mmol/L (ref 98–111)
CO2: 26 mmol/L (ref 22–32)
CREATININE: 1.21 mg/dL — AB (ref 0.44–1.00)
Calcium: 8.1 mg/dL — ABNORMAL LOW (ref 8.9–10.3)
GFR calc Af Amer: >60 mL/min (ref >60)
GFR calc non Af Amer: 54 mL/min — ABNORMAL LOW (ref >60)
Glucose, Bld: 126 mg/dL — ABNORMAL HIGH (ref 70–99)
POTASSIUM: 4.3 mmol/L (ref 3.5–5.1)
Sodium: 140 mmol/L (ref 135–145)

## 2018-07-15 LAB — URINE CULTURE

## 2018-07-15 LAB — CBC
HEMATOCRIT: 30.5 % — AB (ref 36.0–46.0)
HEMOGLOBIN: 9.4 g/dL — AB (ref 12.0–15.0)
MCH: 28.1 pg (ref 26.0–34.0)
MCHC: 30.8 g/dL (ref 30.0–36.0)
MCV: 91 fL (ref 80.0–100.0)
NRBC: 0 % (ref 0.0–0.2)
Platelets: 291 10*3/uL (ref 150–400)
RBC: 3.35 MIL/uL — ABNORMAL LOW (ref 3.87–5.11)
RDW: 13.5 % (ref 11.5–15.5)
WBC: 7.1 10*3/uL (ref 4.0–10.5)

## 2018-07-15 LAB — GLUCOSE, CAPILLARY
GLUCOSE-CAPILLARY: 120 mg/dL — AB (ref 70–99)
GLUCOSE-CAPILLARY: 210 mg/dL — AB (ref 70–99)
Glucose-Capillary: 178 mg/dL — ABNORMAL HIGH (ref 70–99)
Glucose-Capillary: 217 mg/dL — ABNORMAL HIGH (ref 70–99)

## 2018-07-15 MED ORDER — HYOSCYAMINE SULFATE 0.125 MG SL SUBL
0.2500 mg | SUBLINGUAL_TABLET | SUBLINGUAL | Status: DC
  Administered 2018-07-15 – 2018-07-16 (×5): 0.25 mg via SUBLINGUAL
  Filled 2018-07-15 (×11): qty 2

## 2018-07-15 MED ORDER — HYOSCYAMINE SULFATE 0.125 MG/ML PO SOLN
0.2500 mg | ORAL | Status: DC
  Filled 2018-07-15 (×2): qty 2

## 2018-07-15 MED ORDER — PHENAZOPYRIDINE HCL 200 MG PO TABS
200.0000 mg | ORAL_TABLET | Freq: Three times a day (TID) | ORAL | Status: DC
  Administered 2018-07-15 – 2018-07-16 (×4): 200 mg via ORAL
  Filled 2018-07-15 (×6): qty 1

## 2018-07-15 NOTE — Progress Notes (Signed)
PROGRESS NOTE    [name]  PYP:[id number] DOB: 1976/04/29 DOA: 07/14/2018 PCP: Sandi Mariscal, MD   Brief Narrative:  [name] is a 42 y.o. female with medical history significant for insulin-dependent type 2 diabetes, hypertension, hyperlipidemia, depression, anxiety, hypothyroidism, and morbid obesity status post tracheostomy who presents with recurrent left flank pain, nausea without emesis, and oliguria.  Patient was recently admitted from 07/05/2018-07/08/2018 for sepsis and acute renal failure secondary to UTI/pyelonephritis.  She was treated for 3 days with IV ceftriaxone and discharge on oral Keflex for 5 more days.   Once she completed the antibiotics her left flank pain returned and worsened.  She has developed decreased appetite with nausea but no emesis.  Her urinary frequency has decreased and she has not urinated much.  In the emergency department bladder scan was obtained and she had 266 mL of urine in the bladder.  Is also been taking ibuprofen 600 mg once every morning for her pain has had no associated chest pain dyspnea fevers diaphoresis etc.  ER had concerned about acute kidney injury and oliguria and requested observation status  She was placed in observation and IV ceftriaxone was begun.   Assessment & Plan:   Principal Problem:   UTI (urinary tract infection) Active Problems:   Hyperlipidemia associated with type 2 diabetes mellitus (HCC)   Hypothyroid   Morbid obesity due to excess calories ([place])   Uncontrolled diabetes mellitus with diabetic neuropathy, with long-term current use of insulin (HCC)   Hypertension associated with diabetes ([place])   Depression   Tracheostomy dependence ([place])   AKI (acute kidney injury) ([place])   Abdominal flank discomfort with possible UTI: Recently admitted and treated for UTI related sepsis and acute renal failure.  No urine culture on file from prior admission. Would expect symptoms to have resolved by now, however it is  possible she had a drug resistant UTI. Will keep on Ceftriaxone.  Urine culture is pending.  View of records reveals that patient has been complaining of decreased urine output since March 2019 when a renal ultrasound was obtained. I wonder if some of her flank and back pain is not related to her body habitus and generous pannus.  AKI with oliguria: Potentially secondary to decreased oral intake, UTI, and NSAID use and diabetes type 2 for 10 years.  IV fluids overnight resulted in a decrease in her creatinine to 1.21. We will continue to monitor renal function.  Etiology remains unclear.  insulin-dependent diabetes type II: On levemir 50 units BID, metformin, pioglitazone at home.  -lower dose basal insulin, increase if needed  Hypertension: Controlled here. Holding home low dose Lisinopril with AKI.  Blood pressure acceptable.  Tracheostomy: Chronic and stable. Trach care and aerosolized trach collar at night.   Hyperlipidemia: Continue Atorvastatin  Hypothyroidism: Continue Synthroid  Depression: Continue Bupropion  DVT prophylaxis: Heparin Code Status: Full code Family Communication: Husband at bedside. Disposition Plan: Pending response to IV abx and urine culture result.  Consults called: none  Admission status: observation   Antimicrobials:   Ceftriaxone 1 g IV every 24 hours November 5 is day 2   Subjective: Patient still with abdominal flank discomfort and back pain.  Objective: Vitals:   07/15/18 0800 07/15/18 0816 07/15/18 1211 07/15/18 1221  BP:   120/75   Pulse:  89 (!) 105 (!) 111  Resp:  20 20 20   Temp:      TempSrc:      SpO2: 99%  100% 99%  Intake/Output Summary (Last 24 hours) at 07/15/2018 1257 Last data filed at 07/15/2018 1000 Gross per 24 hour  Intake 3076.83 ml  Output 110 ml  Net 2966.83 ml   There were no vitals filed for this visit.  Examination:  General exam: Appears calm and comfortable  Respiratory system: Clear to  auscultation. Respiratory effort normal. Cardiovascular system: S1 & S2 heard, RRR. No JVD, murmurs, rubs, gallops or clicks. No pedal edema. Gastrointestinal system: Abdomen is nondistended, soft and nontender. No organomegaly or masses felt. Normal bowel sounds heard. Central nervous system: Alert and oriented. No focal neurological deficits. Extremities: Symmetric 5 x 5 power. Skin: No rashes, lesions or ulcers Psychiatry: Judgement and insight appear normal. Mood & affect appropriate.     Data Reviewed: I have personally reviewed following labs and imaging studies  CBC: Recent Labs  Lab 07/14/18 1356 07/15/18 0436  WBC 11.3* 7.1  HGB 10.6* 9.4*  HCT 36.2 30.5*  MCV 92.6 91.0  PLT 388 188   Basic Metabolic Panel: Recent Labs  Lab 07/14/18 1356 07/15/18 0436  NA 136 140  K 4.0 4.3  CL 103 110  CO2 24 26  GLUCOSE 265* 126*  BUN 16 13  CREATININE 1.51* 1.21*  CALCIUM 8.5* 8.1*   CBG: Recent Labs  Lab 07/14/18 2210 07/15/18 0755 07/15/18 1148  GLUCAP 141* 120* 178*   Sepsis Labs: Recent Labs  Lab 07/14/18 1655 07/14/18 1709  LATICACIDVEN 1.35 1.59    Recent Results (from the past 240 hour(s))  Blood Culture (routine x 2)     Status: None   Collection Time: 07/05/18  1:07 PM  Result Value Ref Range Status   Specimen Description BLOOD RIGHT ANTECUBITAL  Final   Special Requests   Final    BOTTLES DRAWN AEROBIC AND ANAEROBIC Blood Culture results may not be optimal due to an inadequate volume of blood received in culture bottles   Culture   Final    NO GROWTH 5 DAYS Performed at [facility], [place] [address], [place], [place] [address]    Report Status 07/10/2018 FINAL  Final  Blood Culture (routine x 2)     Status: None   Collection Time: 07/05/18  1:22 PM  Result Value Ref Range Status   Specimen Description BLOOD LEFT ANTECUBITAL  Final   Special Requests   Final    BOTTLES DRAWN AEROBIC AND ANAEROBIC Blood Culture results may not be optimal  due to an inadequate volume of blood received in culture bottles   Culture   Final    NO GROWTH 5 DAYS Performed at [facility], [place] [address] [place], [place] [address]    Report Status 07/10/2018 FINAL  Final  MRSA PCR Screening     Status: None   Collection Time: 07/05/18  5:53 PM  Result Value Ref Range Status   MRSA by PCR NEGATIVE NEGATIVE Final    Comment:        The GeneXpert MRSA Assay (FDA approved for NASAL specimens only), is one component of a comprehensive MRSA colonization surveillance program. It is not intended to diagnose MRSA infection nor to guide or monitor treatment for MRSA infections. Performed at [facility], [place] [address], [place], [place] [address]      Radiology Studies: Dg Chest 2 View  Result Date: 07/14/2018 CLINICAL DATA:  Sepsis EXAM: CHEST - 2 VIEW COMPARISON:  07/05/2018, 06/28/2017 FINDINGS: Partially visualized tracheal tube with the tip about 4.4 cm superior to  the carina. No focal airspace disease or pleural effusion. Borderline cardiomegaly. No pneumothorax. IMPRESSION: No active cardiopulmonary disease. Electronically Signed   By: Donavan Foil M.D.   On: 07/14/2018 15:52   Ct Renal Stone Study  Result Date: 07/14/2018 CLINICAL DATA:  42 year old female with left flank pain and decreased urination. Admitted last week for pyelonephritis receiving IV antibiotics and discharged on oral antibiotics. Subsequent encounter. EXAM: CT ABDOMEN AND PELVIS WITHOUT CONTRAST TECHNIQUE: Multidetector CT imaging of the abdomen and pelvis was performed following the standard protocol without IV contrast. COMPARISON:  06/28/2018 pelvic CT. 10/31/2017 abdominal and pelvic CT. FINDINGS: Lower chest: Scarring/subsegmental atelectasis lung bases. Heart size top-normal. Trace pericardial fluid. Hepatobiliary: Elongated liver spanning over 22.5 cm. Taking into account limitation by non contrast imaging, no worrisome hepatic lesion. Post  cholecystectomy. Pancreas: Taking into account limitation by non contrast imaging, no worrisome pancreatic lesion or inflammation. Spleen: Taking into account limitation by non contrast imaging, no splenic mass or enlargement. Adrenals/Urinary Tract: No obstructing stone or hydronephrosis. Right lower pole tiny calcification may represent vascular calcification or nonobstructing tiny renal calculi. Taking into account limitation by non contrast imaging, no worrisome renal or adrenal abnormality. Urinary bladder decompressed and evaluation limited. Stomach/Bowel: No extraluminal bowel inflammatory process is noted. Portions of the colon, stomach and small bowel are under distended limiting evaluation for detection of a mass. Vascular/Lymphatic: No abdominal aortic aneurysm. Mild calcified plaque iliac arteries. Scattered normal size lymph nodes. Reproductive: No worrisome adnexal or uterine mass noted. Other: No free air or bowel containing hernia. Small fat and vessel containing hernias. Musculoskeletal: Mild sacroiliac joint degenerative changes. L5-S1 facet degenerative changes. Minimal degenerative changes lower lumbar spine. No osseous destructive lesion. IMPRESSION: 1. No obstructing stone or hydronephrosis. 2. Right lower pole tiny calcification may be a vascular calcification or nonobstructing tiny right renal calculi. 3. No extraluminal bowel inflammatory process noted. 4. Post cholecystectomy. 5. Elongated liver. 6. Mild calcified plaque iliac arteries. 7. Small fat and vessel containing anterior abdominal/pelvic wall hernias. No bowel containing hernia. Electronically Signed   By: Genia Del M.D.   On: 07/14/2018 15:53        Scheduled Meds: . atorvastatin  40 mg Oral QHS  . buPROPion  300 mg Oral Daily  . fluticasone  1 spray Each Nare Daily  . gabapentin  600 mg Oral BID  . heparin  5,000 Units Subcutaneous Q8H  . hydrOXYzine  50 mg Oral QHS  . hyoscyamine  0.25 mg Sublingual Q4H  .  insulin detemir  30 Units Subcutaneous BID  . levothyroxine  125 mcg Oral QAC breakfast  . pantoprazole  40 mg Oral Daily  . phenazopyridine  200 mg Oral TID WC  . traZODone  50 mg Oral QHS   Continuous Infusions: . cefTRIAXone (ROCEPHIN)  IV       LOS: 0 days    Time spent: 45 minutes    Lady Deutscher, MD [facility] Pager [phone]  If 7PM-7AM, please contact night-coverage [email or web address] Password [facility] 07/15/2018, 12:57 PM

## 2018-07-15 NOTE — Progress Notes (Signed)
Preliminary notes--Left lower extremity venous duplex exam completed. Negative for DVT.  Jolie Strohecker H Salome Hautala(RDMS RVT) 07/15/18 2:08 PM

## 2018-07-15 NOTE — Progress Notes (Signed)
Attempted to get report, RN with another patient at this time,unable to give report. Enrique Manganaro, Wonda Cheng, Therapist, sports

## 2018-07-16 DIAGNOSIS — R34 Anuria and oliguria: Secondary | ICD-10-CM | POA: Diagnosis present

## 2018-07-16 DIAGNOSIS — N390 Urinary tract infection, site not specified: Secondary | ICD-10-CM

## 2018-07-16 DIAGNOSIS — E785 Hyperlipidemia, unspecified: Secondary | ICD-10-CM | POA: Diagnosis present

## 2018-07-16 DIAGNOSIS — Z880 Allergy status to penicillin: Secondary | ICD-10-CM | POA: Diagnosis not present

## 2018-07-16 DIAGNOSIS — N12 Tubulo-interstitial nephritis, not specified as acute or chronic: Secondary | ICD-10-CM | POA: Diagnosis present

## 2018-07-16 DIAGNOSIS — Z79890 Hormone replacement therapy (postmenopausal): Secondary | ICD-10-CM | POA: Diagnosis not present

## 2018-07-16 DIAGNOSIS — E249 Cushing's syndrome, unspecified: Secondary | ICD-10-CM | POA: Diagnosis present

## 2018-07-16 DIAGNOSIS — I152 Hypertension secondary to endocrine disorders: Secondary | ICD-10-CM | POA: Diagnosis present

## 2018-07-16 DIAGNOSIS — Z79899 Other long term (current) drug therapy: Secondary | ICD-10-CM | POA: Diagnosis not present

## 2018-07-16 DIAGNOSIS — N179 Acute kidney failure, unspecified: Secondary | ICD-10-CM | POA: Diagnosis present

## 2018-07-16 DIAGNOSIS — Z87891 Personal history of nicotine dependence: Secondary | ICD-10-CM | POA: Diagnosis not present

## 2018-07-16 DIAGNOSIS — E1140 Type 2 diabetes mellitus with diabetic neuropathy, unspecified: Secondary | ICD-10-CM | POA: Diagnosis present

## 2018-07-16 DIAGNOSIS — Z6841 Body Mass Index (BMI) 40.0 and over, adult: Secondary | ICD-10-CM | POA: Diagnosis not present

## 2018-07-16 DIAGNOSIS — E039 Hypothyroidism, unspecified: Secondary | ICD-10-CM | POA: Diagnosis present

## 2018-07-16 DIAGNOSIS — F329 Major depressive disorder, single episode, unspecified: Secondary | ICD-10-CM | POA: Diagnosis present

## 2018-07-16 DIAGNOSIS — Z888 Allergy status to other drugs, medicaments and biological substances status: Secondary | ICD-10-CM | POA: Diagnosis not present

## 2018-07-16 DIAGNOSIS — Z930 Tracheostomy status: Secondary | ICD-10-CM | POA: Diagnosis not present

## 2018-07-16 DIAGNOSIS — E1165 Type 2 diabetes mellitus with hyperglycemia: Secondary | ICD-10-CM | POA: Diagnosis present

## 2018-07-16 DIAGNOSIS — Z794 Long term (current) use of insulin: Secondary | ICD-10-CM | POA: Diagnosis not present

## 2018-07-16 DIAGNOSIS — K529 Noninfective gastroenteritis and colitis, unspecified: Secondary | ICD-10-CM | POA: Diagnosis present

## 2018-07-16 DIAGNOSIS — F419 Anxiety disorder, unspecified: Secondary | ICD-10-CM | POA: Diagnosis present

## 2018-07-16 DIAGNOSIS — M810 Age-related osteoporosis without current pathological fracture: Secondary | ICD-10-CM | POA: Diagnosis present

## 2018-07-16 LAB — GLUCOSE, CAPILLARY
GLUCOSE-CAPILLARY: 91 mg/dL (ref 70–99)
GLUCOSE-CAPILLARY: 211 mg/dL — AB (ref 70–99)
Glucose-Capillary: 155 mg/dL — ABNORMAL HIGH (ref 70–99)
Glucose-Capillary: 233 mg/dL — ABNORMAL HIGH (ref 70–99)

## 2018-07-16 LAB — BASIC METABOLIC PANEL
ANION GAP: 7 (ref 5–15)
BUN: 8 mg/dL (ref 6–20)
CO2: 26 mmol/L (ref 22–32)
Calcium: 8.7 mg/dL — ABNORMAL LOW (ref 8.9–10.3)
Chloride: 107 mmol/L (ref 98–111)
Creatinine, Ser: 0.71 mg/dL (ref 0.44–1.00)
GLUCOSE: 107 mg/dL — AB (ref 70–99)
Potassium: 4 mmol/L (ref 3.5–5.1)
Sodium: 140 mmol/L (ref 135–145)

## 2018-07-16 MED ORDER — INSULIN DETEMIR 100 UNIT/ML FLEXPEN: 30.0000 [IU] | PEN_INJECTOR | Freq: Two times a day (BID) | SUBCUTANEOUS | 11 refills | Status: DC

## 2018-07-16 NOTE — Discharge Summary (Signed)
Physician Discharge Summary  [name] FVC:[id number] DOB: 1976/02/23 DOA: 07/14/2018  PCP: Sandi Mariscal, MD  Admit date: 07/14/2018 Discharge date: 07/16/2018  Time spent: 45 minutes  Recommendations for Outpatient Follow-up:  1. Recommend outpatient reevaluation by primary care physician and labs 1 week 2. Does probably need suppressive therapy for urinary infections 3. Should follow-up in trach clinic and see if stoma size can be reduced as an outpatient   Discharge Diagnoses:  Principal Problem:   UTI (urinary tract infection) Active Problems:   Hyperlipidemia associated with type 2 diabetes mellitus ([place])   Hypothyroid   Morbid obesity due to excess calories ([place])   Uncontrolled diabetes mellitus with diabetic neuropathy, with long-term current use of insulin ([place])   Hypertension associated with diabetes ([place])   Depression   Tracheostomy dependence ([place])   AKI (acute kidney injury) ([place])   Pyelonephritis   Discharge Condition: Improved  Diet recommendation: Diabetic  There were no vitals filed for this visit.  History of present illness:  42 year old female with Body mass index is 53.41 kg/m. DM TY 2, HTN, HLD, bipolar, OSA, questions disease prior hospital stays 12/2017 [facility] read[facility] 10/26 and 11/4 Found to have oliguria-as well as possible urinary infection-she was treated with antibiotics recently 10/29 and discharged on Keflex She was found to have 40,000 yeast in her urine, no bacteria and although she had a slightly one-time elevation of her white count 11.3 she did not have a recurrence of this It was felt that some of her medications like her psych meds may have caused anticholinergic effects in addition to her habitus which is consistent with morbid obesity may have caused difficulty in passing urine Her creatinine on admission was elevated which was a cause for her admission in terms of acute kidney  injury was 16/1.5 on admission and trended down with IV fluid repletion and med management to 8/0.7 on discharge She was stabilized and felt stable to go home she does have some mild oliguria and her medication should be adjusted for anticholinergic effects  Discharge Exam: Vitals:   07/16/18 0957 07/16/18 1211  BP:    Pulse:  89  Resp:  16  Temp:    SpO2: 100% 96%    General: Alert obese no distress Cardiovascular: S1-S2 no murmur rub or gallop Respiratory: Clinically clear no added sound Abdomen is obese nontender no rebound no guarding No lower extremity edema  Discharge Instructions   Discharge Instructions    Diet - low sodium heart healthy   Complete by:  As directed    Discharge instructions   Complete by:  As directed    Make sure you do not take any OTC meds--some meds can cause you to have urinary retention and I feel some medications that you are on [some of the antidepressants] can precipitate this a little   Increase activity slowly   Complete by:  As directed      Allergies as of 07/16/2018      Reactions   Bactrim [sulfamethoxazole-trimethoprim] Rash   Penicillins Hives, Other (See Comments)   Tolerated zosyn 12/12/17 Has patient had a PCN reaction causing immediate rash, facial/tongue/throat swelling, SOB or lightheadedness with hypotension: No Has patient had a PCN reaction causing severe rash involving mucus membranes or skin necrosis: Yes Has patient had a PCN reaction that required hospitalization: Yes Has patient had a PCN reaction occurring within the last 10 years: No If all of the above answers  are "NO", then may proceed with Cephalosporin use.      Medication List    STOP taking these medications   cephALEXin 500 MG capsule Commonly known as:  KEFLEX     TAKE these medications   atorvastatin 40 MG tablet Commonly known as:  LIPITOR Take 40 mg by mouth at bedtime.   buPROPion 300 MG 24 hr tablet Commonly known as:  WELLBUTRIN XL Take 300  mg by mouth daily.   cholestyramine 4 g packet Commonly known as:  QUESTRAN Take 1 packet (4 g total) by mouth every other day.   dextromethorphan-guaiFENesin 30-600 MG 12hr tablet Commonly known as:  MUCINEX DM Take 1 tablet by mouth daily.   fluticasone 50 MCG/ACT nasal spray Commonly known as:  FLONASE Place 2 sprays into both nostrils daily. What changed:  how much to take   gabapentin 600 MG tablet Commonly known as:  NEURONTIN Take 600 mg by mouth 2 (two) times daily.   hydrOXYzine 50 MG capsule Commonly known as:  VISTARIL Take 50 mg by mouth at bedtime.   Insulin Detemir 100 UNIT/ML Pen Commonly known as:  LEVEMIR Inject 30 Units into the skin 2 (two) times daily. What changed:  how much to take   levothyroxine 125 MCG tablet Commonly known as:  SYNTHROID, LEVOTHROID Take 125 mcg by mouth daily before breakfast. What changed:  Another medication with the same name was removed. Continue taking this medication, and follow the directions you see here.   lisinopril 2.5 MG tablet Commonly known as:  PRINIVIL,ZESTRIL Take 2.5 mg by mouth daily.   metFORMIN 1000 MG tablet Commonly known as:  GLUCOPHAGE Take 1,000 mg by mouth 2 (two) times daily with a meal.   metoprolol tartrate 25 MG tablet Commonly known as:  LOPRESSOR Take 0.5 tablets (12.5 mg total) by mouth 2 (two) times daily.   ondansetron 4 MG tablet Commonly known as:  ZOFRAN Take 4 mg by mouth every 8 (eight) hours as needed for nausea or vomiting.   pantoprazole 40 MG tablet Commonly known as:  PROTONIX TAKE ONE TABLET BY MOUTH TWICE A DAY BEFORE MEALS What changed:  See the new instructions.   traZODone 100 MG tablet Commonly known as:  DESYREL Take 50 mg by mouth at bedtime.      Allergies  Allergen Reactions  . Bactrim [Sulfamethoxazole-Trimethoprim] Rash  . Penicillins Hives and Other (See Comments)    Tolerated zosyn 12/12/17 Has patient had a PCN reaction causing immediate rash,  facial/tongue/throat swelling, SOB or lightheadedness with hypotension: No Has patient had a PCN reaction causing severe rash involving mucus membranes or skin necrosis: Yes Has patient had a PCN reaction that required hospitalization: Yes Has patient had a PCN reaction occurring within the last 10 years: No If all of the above answers are "NO", then may proceed with Cephalosporin use.      The results of significant diagnostics from this hospitalization (including imaging, microbiology, ancillary and laboratory) are listed below for reference.    Significant Diagnostic Studies: Dg Chest 2 View  Result Date: 07/14/2018 CLINICAL DATA:  Sepsis EXAM: CHEST - 2 VIEW COMPARISON:  07/05/2018, 06/28/2017 FINDINGS: Partially visualized tracheal tube with the tip about 4.4 cm superior to the carina. No focal airspace disease or pleural effusion. Borderline cardiomegaly. No pneumothorax. IMPRESSION: No active cardiopulmonary disease. Electronically Signed   By: Donavan Foil M.D.   On: 07/14/2018 15:52   Dg Chest 2 View  Result Date: 06/28/2018 CLINICAL DATA:  42  y/o  F; throat pain for 3-4 weeks. EXAM: CHEST - 2 VIEW COMPARISON:  05/22/2018 chest radiograph. FINDINGS: Stable cardiomegaly given projection and technique. Stable positioning of tracheostomy tube. No consolidation, effusion, or pneumothorax. No acute osseous abnormality is evident. IMPRESSION: Stable cardiomegaly. No acute pulmonary process identified. Electronically Signed   By: Kristine Garbe M.D.   On: 06/28/2018 06:36   Ct Soft Tissue Neck W Contrast  Result Date: 06/28/2018 CLINICAL DATA:  Sore throat for 3 weeks. Epiglottitis or tonsillitis suspected. EXAM: CT NECK WITH CONTRAST TECHNIQUE: Multidetector CT imaging of the neck was performed using the standard protocol following the bolus administration of intravenous contrast. CONTRAST:  18mL ISOVUE-300 IOPAMIDOL (ISOVUE-300) INJECTION 61% COMPARISON:  CT of the neck  12/10/2017. FINDINGS: Pharynx and larynx: There is marked enlargement of the adenoid tissue. No discrete mass lesion is present. Palatine tonsils are moderately enlarged. No abscess or mass is present. Tongue base is within normal limits. Epiglottis is unremarkable. Hypopharynx is within normal limits. Vocal cords are midline. Tracheostomy tube is in place. Salivary glands: Submandibular and parotid glands are within normal limits bilaterally. Thyroid: Unremarkable. Lymph nodes: Enlarged bilateral to get a good gastric lymph nodes are likely reactive. Other enlarged level 2 nodes are more prominent left than right. Vascular: No significant vascular calcifications are present. Limited intracranial: Unremarkable. Visualized orbits: Bilateral lens replacements are present. Globes and orbits are within normal limits otherwise. Mastoids and visualized paranasal sinuses: The paranasal sinuses and mastoid air cells are clear. Skeleton: Vertebral body heights are normal. Alignment is anatomic. No focal lytic or blastic lesions are present. Uncovertebral and facet disease is greatest at C5-6 with osseous foraminal narrowing bilaterally. Upper chest: The lung apices are clear. IMPRESSION: 1. Prominent adenoid tissue and palatine tonsils compatible with acute pharyngitis. No abscess or mass lesion is present. 2. Left greater than right level 2 cervical adenopathy is likely reactive. 3. Tracheostomy tube in place without complication. 4. Degenerative changes of the cervical spine are most pronounced at C5-6. Electronically Signed   By: San Morelle M.D.   On: 06/28/2018 10:00   Ct Pelvis W Contrast  Result Date: 06/28/2018 CLINICAL DATA:  42 year old with concern for rectal abscess. Pain and burning around the rectum. EXAM: CT PELVIS WITH CONTRAST TECHNIQUE: Multidetector CT imaging of the pelvis was performed using the standard protocol following the bolus administration of intravenous contrast. CONTRAST:  164mL  ISOVUE-300 IOPAMIDOL (ISOVUE-300) INJECTION 61% COMPARISON:  10/31/2016 FINDINGS: Urinary Tract: Normal appearance of the urinary bladder. Limited evaluation of the kidneys and no gross abnormality to the visualized ureters. Bowel: Normal appearance of the visualized bowel structures. Normal appearance of the rectum and perianal region. There is no significant soft tissue swelling in the perineum. Vascular/Lymphatic: Small lymph nodes in the inguinal regions and lower pelvis are again noted. Lower right external iliac lymph node has mildly enlarged in size measuring 1.2 cm in the short axis and previously measured 0.9 cm in the short axis. Inguinal lymph nodes are slightly more prominent than they were on the prior examination. Atherosclerotic calcifications in the iliac arteries. Reproductive:  The uterus and adnexal structures are unremarkable. Other: No free fluid. Again noted are small ventral hernias containing fat. Mild subcutaneous edema. Musculoskeletal: No acute bone abnormality. Degenerative facet disease in lower lumbar spine. IMPRESSION: 1. No evidence for a perirectal or perianal abscess. 2. Small ventral hernias containing fat. 3. Slightly enlarged inguinal and lower pelvic lymph nodes. Findings are nonspecific but likely reactive. Electronically Signed  By: Markus Daft M.D.   On: 06/28/2018 10:23   Dg Chest Port 1 View  Result Date: 07/05/2018 CLINICAL DATA:  Tachycardia and hypotensive. EXAM: PORTABLE CHEST 1 VIEW COMPARISON:  06/28/2018 FINDINGS: The cardiac silhouette, mediastinal and hilar contours are within normal limits and stable. Lungs are clear. The tracheostomy tube is in good position. The bony thorax is intact. IMPRESSION: No acute cardiopulmonary findings. Electronically Signed   By: Marijo Sanes M.D.   On: 07/05/2018 13:33   Ct Renal Stone Study  Result Date: 07/14/2018 CLINICAL DATA:  42 year old female with left flank pain and decreased urination. Admitted last week for  pyelonephritis receiving IV antibiotics and discharged on oral antibiotics. Subsequent encounter. EXAM: CT ABDOMEN AND PELVIS WITHOUT CONTRAST TECHNIQUE: Multidetector CT imaging of the abdomen and pelvis was performed following the standard protocol without IV contrast. COMPARISON:  06/28/2018 pelvic CT. 10/31/2017 abdominal and pelvic CT. FINDINGS: Lower chest: Scarring/subsegmental atelectasis lung bases. Heart size top-normal. Trace pericardial fluid. Hepatobiliary: Elongated liver spanning over 22.5 cm. Taking into account limitation by non contrast imaging, no worrisome hepatic lesion. Post cholecystectomy. Pancreas: Taking into account limitation by non contrast imaging, no worrisome pancreatic lesion or inflammation. Spleen: Taking into account limitation by non contrast imaging, no splenic mass or enlargement. Adrenals/Urinary Tract: No obstructing stone or hydronephrosis. Right lower pole tiny calcification may represent vascular calcification or nonobstructing tiny renal calculi. Taking into account limitation by non contrast imaging, no worrisome renal or adrenal abnormality. Urinary bladder decompressed and evaluation limited. Stomach/Bowel: No extraluminal bowel inflammatory process is noted. Portions of the colon, stomach and small bowel are under distended limiting evaluation for detection of a mass. Vascular/Lymphatic: No abdominal aortic aneurysm. Mild calcified plaque iliac arteries. Scattered normal size lymph nodes. Reproductive: No worrisome adnexal or uterine mass noted. Other: No free air or bowel containing hernia. Small fat and vessel containing hernias. Musculoskeletal: Mild sacroiliac joint degenerative changes. L5-S1 facet degenerative changes. Minimal degenerative changes lower lumbar spine. No osseous destructive lesion. IMPRESSION: 1. No obstructing stone or hydronephrosis. 2. Right lower pole tiny calcification may be a vascular calcification or nonobstructing tiny right renal  calculi. 3. No extraluminal bowel inflammatory process noted. 4. Post cholecystectomy. 5. Elongated liver. 6. Mild calcified plaque iliac arteries. 7. Small fat and vessel containing anterior abdominal/pelvic wall hernias. No bowel containing hernia. Electronically Signed   By: Genia Del M.D.   On: 07/14/2018 15:53   Vas [place] Lower Extremity Venous (dvt)  Result Date: 07/15/2018  Lower Venous Study Indications: Edema, and Pain.  Limitations: Body habitus. Performing Technologist: Lorina Rabon  Examination Guidelines: A complete evaluation includes B-mode imaging, spectral Doppler, color Doppler, and power Doppler as needed of all accessible portions of each vessel. Bilateral testing is considered an integral part of a complete examination. Limited examinations for reoccurring indications may be performed as noted.  Right Venous Findings: +---+---------------+---------+-----------+----------+-------+    CompressibilityPhasicitySpontaneityPropertiesSummary +---+---------------+---------+-----------+----------+-------+ CFVFull           Yes      Yes                          +---+---------------+---------+-----------+----------+-------+  Left Venous Findings: +---------+---------------+---------+-----------+----------+------------------+          CompressibilityPhasicitySpontaneityPropertiesSummary            +---------+---------------+---------+-----------+----------+------------------+ CFV      Full           Yes      Yes                                     +---------+---------------+---------+-----------+----------+------------------+  SFJ      Full                                                            +---------+---------------+---------+-----------+----------+------------------+ FV Prox  Full                                         poor flow          +---------+---------------+---------+-----------+----------+------------------+ FV Mid   Full                                                             +---------+---------------+---------+-----------+----------+------------------+ FV DistalFull                                         poor flow          +---------+---------------+---------+-----------+----------+------------------+ PFV                                                   not clear                                                                visualized         +---------+---------------+---------+-----------+----------+------------------+ POP      Full           Yes      Yes                                     +---------+---------------+---------+-----------+----------+------------------+ PTV                              Yes                  not well                                                                 visualized         +---------+---------------+---------+-----------+----------+------------------+ PERO                             Yes  not well                                                                 visualized         +---------+---------------+---------+-----------+----------+------------------+    Summary: Right: No evidence of common femoral vein obstruction. Left: There is no evidence of deep vein thrombosis in the lower extremity. However, portions of this examination were limited- see technologist comments above. No cystic structure found in the popliteal fossa.  *See table(s) above for measurements and observations. Electronically signed by Harold Barban MD on 07/15/2018 at 6:54:05 PM.    Final     Microbiology: Recent Results (from the past 240 hour(s))  Urine culture     Status: Abnormal   Collection Time: 07/14/18  3:15 PM  Result Value Ref Range Status   Specimen Description URINE, RANDOM  Final   Special Requests   Final    NONE Performed at [facility], [address] [place], [place] [address]    Culture 40,000 COLONIES/mL YEAST (A)   Final   Report Status 07/15/2018 FINAL  Final  Blood culture (routine x 2)     Status: None (Preliminary result)   Collection Time: 07/14/18  3:15 PM  Result Value Ref Range Status   Specimen Description BLOOD LEFT ANTECUBITAL  Final   Special Requests   Final    BOTTLES DRAWN AEROBIC AND ANAEROBIC Blood Culture adequate volume   Culture   Final    NO GROWTH 2 DAYS Performed at [facility], [place] [address], [place], [place] [address]    Report Status PENDING  Incomplete  Blood culture (routine x 2)     Status: None (Preliminary result)   Collection Time: 07/14/18  3:20 PM  Result Value Ref Range Status   Specimen Description BLOOD RIGHT ANTECUBITAL  Final   Special Requests   Final    BOTTLES DRAWN AEROBIC AND ANAEROBIC Blood Culture adequate volume   Culture   Final    NO GROWTH 2 DAYS Performed at [facility], [place] [address] [place], [place] [address]    Report Status PENDING  Incomplete     Labs: Basic Metabolic Panel: Recent Labs  Lab 07/14/18 1356 07/15/18 0436 07/16/18 0914  NA 136 140 140  K 4.0 4.3 4.0  CL 103 110 107  CO2 24 26 26   GLUCOSE 265* 126* 107*  BUN 16 13 8   CREATININE 1.51* 1.21* 0.71  CALCIUM 8.5* 8.1* 8.7*   Liver Function Tests: No results for input(s): AST, ALT, ALKPHOS, BILITOT, PROT, ALBUMIN in the last 168 hours. No results for input(s): LIPASE, AMYLASE in the last 168 hours. No results for input(s): AMMONIA in the last 168 hours. CBC: Recent Labs  Lab 07/14/18 1356 07/15/18 0436  WBC 11.3* 7.1  HGB 10.6* 9.4*  HCT 36.2 30.5*  MCV 92.6 91.0  PLT 388 291   Cardiac Enzymes: No results for input(s): CKTOTAL, CKMB, CKMBINDEX, TROPONINI in the last 168 hours. BNP: BNP (last 3 results) Recent Labs    12/22/17 0259  BNP 33.5    ProBNP (last 3 results) No results for input(s): PROBNP in the last 8760 hours.  CBG: Recent Labs  Lab 07/15/18 2116 07/16/18 0022 07/16/18 0753 07/16/18 1111  07/16/18 1141  [place] 233* 211*       Signed:  Nita Sells MD   [facility] 07/16/2018, 12:34 PM

## 2018-07-16 NOTE — Progress Notes (Signed)
Inpatient Diabetes Program Recommendations  AACE/ADA: New Consensus Statement on Inpatient Glycemic Control (2015)  Target Ranges:  Prepandial:   less than 140 mg/dL      Peak postprandial:   less than 180 mg/dL (1-2 hours)      Critically ill patients:  140 - 180 mg/dL   Results for [name], [name] (MRN [id number]) as of 07/16/2018 08:48  Ref. Range 07/15/2018 07:55 07/15/2018 11:48 07/15/2018 17:32 07/15/2018 21:16  Glucose-Capillary Latest Ref Range: 70 - 99 mg/dL 120 (H) 178 (H)  30 units LEVEMIR 217 (H) 210 (H)  30 units LEVEMIR   Results for [name], [name] (MRN [id number]) as of 07/16/2018 08:48  Ref. Range 07/16/2018 07:53  Glucose-Capillary Latest Ref Range: 70 - 99 mg/dL 91    Admit with: UTI/ Sepsis/ Acute Renal Failure  History: DM  Home DM Meds: Levemir 50 units BID       Metformin 1000 mg BID  Current Orders: Levemir 30 units BID      MD- Please consider placing orders for Novolog Sensitive Correction Scale/ SSI (0-9 units) TID AC + HS  Please also change current diet to Carbohydrate Modified diet (currently ordered as Regular diet)    --Will follow patient during hospitalization--  Wyn Quaker RN, MSN, CDE Diabetes Coordinator Inpatient Glycemic Control Team Team Pager: [phone] (8a-5p)

## 2018-07-16 NOTE — Progress Notes (Signed)
[name] to be D/C'd Home per MD order.  Discussed prescriptions and follow up appointments with the patient. Prescriptions given to patient, medication list explained in detail. Pt verbalized understanding.  Allergies as of 07/16/2018      Reactions   Bactrim [sulfamethoxazole-trimethoprim] Rash   Penicillins Hives, Other (See Comments)   Tolerated zosyn 12/12/17 Has patient had a PCN reaction causing immediate rash, facial/tongue/throat swelling, SOB or lightheadedness with hypotension: No Has patient had a PCN reaction causing severe rash involving mucus membranes or skin necrosis: Yes Has patient had a PCN reaction that required hospitalization: Yes Has patient had a PCN reaction occurring within the last 10 years: No If all of the above answers are "NO", then may proceed with Cephalosporin use.      Medication List    STOP taking these medications   cephALEXin 500 MG capsule Commonly known as:  KEFLEX     TAKE these medications   atorvastatin 40 MG tablet Commonly known as:  LIPITOR Take 40 mg by mouth at bedtime.   buPROPion 300 MG 24 hr tablet Commonly known as:  WELLBUTRIN XL Take 300 mg by mouth daily.   cholestyramine 4 g packet Commonly known as:  QUESTRAN Take 1 packet (4 g total) by mouth every other day.   dextromethorphan-guaiFENesin 30-600 MG 12hr tablet Commonly known as:  MUCINEX DM Take 1 tablet by mouth daily.   fluticasone 50 MCG/ACT nasal spray Commonly known as:  FLONASE Place 2 sprays into both nostrils daily. What changed:  how much to take   gabapentin 600 MG tablet Commonly known as:  NEURONTIN Take 600 mg by mouth 2 (two) times daily.   hydrOXYzine 50 MG capsule Commonly known as:  VISTARIL Take 50 mg by mouth at bedtime.   Insulin Detemir 100 UNIT/ML Pen Commonly known as:  LEVEMIR Inject 30 Units into the skin 2 (two) times daily. What changed:  how much to take   levothyroxine 125 MCG tablet Commonly known as:  SYNTHROID,  LEVOTHROID Take 125 mcg by mouth daily before breakfast. What changed:  Another medication with the same name was removed. Continue taking this medication, and follow the directions you see here.   lisinopril 2.5 MG tablet Commonly known as:  PRINIVIL,ZESTRIL Take 2.5 mg by mouth daily.   metFORMIN 1000 MG tablet Commonly known as:  GLUCOPHAGE Take 1,000 mg by mouth 2 (two) times daily with a meal.   metoprolol tartrate 25 MG tablet Commonly known as:  LOPRESSOR Take 0.5 tablets (12.5 mg total) by mouth 2 (two) times daily.   ondansetron 4 MG tablet Commonly known as:  ZOFRAN Take 4 mg by mouth every 8 (eight) hours as needed for nausea or vomiting.   pantoprazole 40 MG tablet Commonly known as:  PROTONIX TAKE ONE TABLET BY MOUTH TWICE A DAY BEFORE MEALS What changed:  See the new instructions.   traZODone 100 MG tablet Commonly known as:  DESYREL Take 50 mg by mouth at bedtime.       Vitals:   07/16/18 0957 07/16/18 1211  BP:    Pulse:  89  Resp:  16  Temp:    SpO2: 100% 96%    Skin clean, dry and intact without evidence of skin break down, no evidence of skin tears noted. IV catheter discontinued intact. Site without signs and symptoms of complications. Dressing and pressure applied. Pt denies pain at this time. No complaints noted.  An After Visit Summary was printed and given to  the patient. Patient escorted via [place], and D/C home via private auto.

## 2018-07-16 NOTE — Progress Notes (Signed)
Received report from 5C RN. Room ready for patient. Future Yeldell Joselita, RN 

## 2018-07-16 NOTE — Progress Notes (Signed)
Came to room for trach check again.  Per pt, she has a trach clinic appointment next week, but pt wants trach to be changed by Marni Griffon NP before she is d/c. I asked pt if her d/c was planned for today, and pt states "no".   I contacted trach clinic, and they contacted Laurey Arrow to arrange trach change that was planned for tomorrow 11/7 while pt was still in hospital.    1510- noted pt is now d/c.  I informed trach clinic scheduler that pt is now d/c, and she is going to reach out to pt to re-confirm an outpatient appointment in trach clinic.

## 2018-07-16 NOTE — Progress Notes (Signed)
Came to room for noon trach check-helping floor RT, pt is out of room currently.

## 2018-07-18 ENCOUNTER — Encounter (HOSPITAL_COMMUNITY): Payer: Self-pay | Admitting: Nurse Practitioner

## 2018-07-18 ENCOUNTER — Emergency Department (HOSPITAL_COMMUNITY)
Admission: EM | Admit: 2018-07-18 | Discharge: 2018-07-19 | Disposition: A | Discharge status: Home / Self Care | Payer: Medicaid Other | Attending: Emergency Medicine | Admitting: Emergency Medicine

## 2018-07-18 DIAGNOSIS — E039 Hypothyroidism, unspecified: Secondary | ICD-10-CM | POA: Insufficient documentation

## 2018-07-18 DIAGNOSIS — I10 Essential (primary) hypertension: Secondary | ICD-10-CM | POA: Insufficient documentation

## 2018-07-18 DIAGNOSIS — R34 Anuria and oliguria: Secondary | ICD-10-CM | POA: Diagnosis not present

## 2018-07-18 DIAGNOSIS — Z794 Long term (current) use of insulin: Secondary | ICD-10-CM | POA: Diagnosis not present

## 2018-07-18 DIAGNOSIS — E1140 Type 2 diabetes mellitus with diabetic neuropathy, unspecified: Secondary | ICD-10-CM | POA: Diagnosis not present

## 2018-07-18 DIAGNOSIS — D649 Anemia, unspecified: Secondary | ICD-10-CM

## 2018-07-18 DIAGNOSIS — Z87891 Personal history of nicotine dependence: Secondary | ICD-10-CM | POA: Insufficient documentation

## 2018-07-18 DIAGNOSIS — Z79899 Other long term (current) drug therapy: Secondary | ICD-10-CM | POA: Diagnosis not present

## 2018-07-18 DIAGNOSIS — R339 Retention of urine, unspecified: Secondary | ICD-10-CM | POA: Diagnosis present

## 2018-07-18 NOTE — ED Triage Notes (Signed)
Pt is c/o persistent urinary retention problems that she states she has be hospitalized twice for in the last 2 weeks.

## 2018-07-19 LAB — BASIC METABOLIC PANEL
Anion gap: 8 (ref 5–15)
BUN: 14 mg/dL (ref 6–20)
CALCIUM: 8.6 mg/dL — AB (ref 8.9–10.3)
CO2: 26 mmol/L (ref 22–32)
CREATININE: 1.14 mg/dL — AB (ref 0.44–1.00)
Chloride: 101 mmol/L (ref 98–111)
GFR calc Af Amer: >60 mL/min (ref >60)
GFR calc non Af Amer: 58 mL/min — ABNORMAL LOW (ref >60)
GLUCOSE: 234 mg/dL — AB (ref 70–99)
Potassium: 3.7 mmol/L (ref 3.5–5.1)
Sodium: 135 mmol/L (ref 135–145)

## 2018-07-19 LAB — CBC WITH DIFFERENTIAL/PLATELET
Abs Immature Granulocytes: 0.03 10*3/uL (ref 0.00–0.07)
BASOS PCT: 0 %
Basophils Absolute: 0 10*3/uL (ref 0.0–0.1)
EOS ABS: 0.1 10*3/uL (ref 0.0–0.5)
Eosinophils Relative: 1 %
HEMATOCRIT: 33.9 % — AB (ref 36.0–46.0)
Hemoglobin: 10.7 g/dL — ABNORMAL LOW (ref 12.0–15.0)
Immature Granulocytes: 0 %
LYMPHS ABS: 2.6 10*3/uL (ref 0.7–4.0)
Lymphocytes Relative: 25 %
MCH: 28.8 pg (ref 26.0–34.0)
MCHC: 31.6 g/dL (ref 30.0–36.0)
MCV: 91.4 fL (ref 80.0–100.0)
MONOS PCT: 4 %
Monocytes Absolute: 0.4 10*3/uL (ref 0.1–1.0)
NEUTROS ABS: 7.4 10*3/uL (ref 1.7–7.7)
NEUTROS PCT: 70 %
Platelets: 314 10*3/uL (ref 150–400)
RBC: 3.71 MIL/uL — ABNORMAL LOW (ref 3.87–5.11)
RDW: 13.2 % (ref 11.5–15.5)
WBC: 10.5 10*3/uL (ref 4.0–10.5)
nRBC: 0 % (ref 0.0–0.2)

## 2018-07-19 LAB — CULTURE, BLOOD (ROUTINE X 2)
Culture: NO GROWTH
Culture: NO GROWTH
SPECIAL REQUESTS: ADEQUATE
SPECIAL REQUESTS: ADEQUATE

## 2018-07-19 LAB — URINALYSIS, ROUTINE W REFLEX MICROSCOPIC
Bilirubin Urine: NEGATIVE
Ketones, ur: NEGATIVE mg/dL
NITRITE: POSITIVE — AB
PH: 5 (ref 5.0–8.0)
Protein, ur: 100 mg/dL — AB
Specific Gravity, Urine: 1.012 (ref 1.005–1.030)

## 2018-07-19 NOTE — Discharge Instructions (Addendum)
Drink plenty of fluids.  Return if you start running a fever, or if you start feeling worse in any way.  Please have your creatinine (blood test of your kidneys) checked when you see your primary care provider in two days. Today, it is 1.14. On 11/6, it was 0.71. On 11/5, it was 1.21

## 2018-07-19 NOTE — ED Provider Notes (Signed)
[facility] DEPT Provider Note   CSN: [id number] Arrival date & time: 07/18/18  2254     History   Chief Complaint Chief Complaint  Patient presents with  . Urinary Retention    HPI [name] is a 42 y.o. female.  The history is provided by the patient.  She has history of hypertension, diabetes, hyperlipidemia and comes in because of lack of urinary output.  She was recently admitted to the hospital with acute kidney injury and urinary infection.  She was discharged 2 days ago and states that she had slight urine output yesterday, but virtually no urine output today.  She denies fever, chills, sweats.  She denies nausea or vomiting.  She states that she has been drinking plenty of water and is concerned because it has not been coming out.  Past Medical History:  Diagnosis Date  . Anxiety   . Complication of anesthesia    Pt. states takes long time to wake up from it.   . Cushing's disease ([place])   . Depression   . Diabetes ([place])   . Hyperlipidemia   . Hyperlipidemia   . Hypertension   . Morbid obesity ([place])   . Osteoporosis 07/19/2015  . Periprosthetic fracture 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[place]), R tibial plateau  07/18/2015  . Sleep apnea   . Uncontrolled diabetes mellitus with diabetic neuropathy, with long-term current use of insulin ([place]) 07/16/2015  . Vitamin D deficiency 07/19/2015    Patient Active Problem List   Diagnosis Date Noted  . Pyelonephritis 07/16/2018  . UTI (urinary tract infection) 07/14/2018  . Sepsis secondary to UTI ([place]) 07/05/2018  . AKI (acute kidney injury) ([place]) 07/05/2018  . Allergic reaction caused by a drug 07/05/2018  . Tracheostomy complication ([place])   . Tracheostomy dependence ([place])   . Obstructive sleep apnea syndrome   . Depression   . Acute respiratory failure with hypoxemia ([place])   . SOB (shortness of breath)   . Tracheostomy status ([place])   . Hoarse voice quality   . Hypomagnesemia   .  Shortness of breath   . Major depressive disorder, recurrent episode, severe ([place])   . Intentional overdose of drug in tablet form ([place]) 11/20/2017  . Intentional drug overdose ([place])   . Seizure ([place])   . Acute respiratory failure with hypercapnia ([place])   . Status epilepticus ([place])   . Spondylosis without myelopathy or radiculopathy, cervical region 10/29/2017  . Abdominal pain, epigastric   . LUQ abdominal pain   . Nausea and vomiting   . Gastroparesis   . Hypertriglyceridemia 07/22/2015  . Diabetes mellitus due to underlying condition, uncontrolled, with diabetic neuropathy, with long-term current use of insulin ([place])   . Vitamin D deficiency 07/19/2015  . 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 Osteoporosis 07/19/2015  . Periprosthetic fracture 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[place]), R tibial plateau  07/18/2015  . Fracture, tibial plateau 07/16/2015  . Uncontrolled diabetes mellitus with diabetic neuropathy, with long-term current use of insulin ([place]) 07/16/2015  . Leukocytosis 07/16/2015  . Hypertension associated with diabetes ([place]) 07/16/2015  . Morbid obesity due to excess calories ([place]) 04/20/2014  . Hyperlipidemia associated with type 2 diabetes mellitus ([place]) 07/31/2012  . Cushing disease ([place]) 07/31/2012  . Hypothyroid 07/31/2012    Past Surgical History:  Procedure Laterality Date  . ANTERIOR TALOFIBULAR LIGAMENT REPAIR Left 11/15/2014   Procedure: ANTERIOR TALOFIBULAR LIGAMENT REPAIR;  Surgeon: Jana Half, DPM;  Location: [facility];  Service: Podiatry;  Laterality: Left;  . CESAREAN SECTION  dec 1997/  06-03-2001/   01-01-2005   BILATERAL TUBAL LIGATION WITH LAST ONE  . DILATION AND CURETTAGE OF UTERUS  1995   WITH SUCTION  . DIRECT LARYNGOSCOPY N/A 12/18/2017   Procedure: DIRECT LARYNGOSCOPY;  Surgeon: Izora Gala, MD;  Location: [facility];  Service: ENT;  Laterality: N/A;  . ESOPHAGOGASTRODUODENOSCOPY  (EGD) WITH PROPOFOL N/A 10/04/2016   Procedure: ESOPHAGOGASTRODUODENOSCOPY (EGD) WITH PROPOFOL;  Surgeon: [name], MD;  Location: [facility];  Service: Endoscopy;  Laterality: N/A;  . LAPAROSCOPIC CHOLECYSTECTOMY  09-25-2005  . ORIF TIBIA PLATEAU Right 07/19/2015   Procedure: OPEN REDUCTION INTERNAL FIXATION (ORIF) RIGHT TIBIAL PLATEAU;  Surgeon: Altamese Hermosa, MD;  Location: [facility];  Service: Orthopedics;  Laterality: Right;  . PARTIAL KNEE ARTHROPLASTY Right 04/19/2014   Procedure: RIGHT UNI KNEE ARTHROPLASTY MEDIALLY ;  Surgeon: Mauri Pole, MD;  Location: [facility];  Service: Orthopedics;  Laterality: Right;  . TRACHEOSTOMY TUBE PLACEMENT N/A 11/28/2017   Procedure: TRACHEOSTOMY;  Surgeon: Izora Gala, MD;  Location: [facility];  Service: ENT;  Laterality: N/A;  . TUBAL LIGATION       OB History   None      Home Medications    Prior to Admission medications   Medication Sig Start Date End Date Taking? Authorizing Provider  atorvastatin (LIPITOR) 40 MG tablet Take 40 mg by mouth at bedtime.    Yes [provider]  buPROPion (WELLBUTRIN XL) 300 MG 24 hr tablet Take 300 mg by mouth daily.   Yes [provider]  dextromethorphan-guaiFENesin (MUCINEX DM) 30-600 MG 12hr tablet Take 1 tablet by mouth daily.   Yes [provider]  fluticasone (FLONASE) 50 MCG/ACT nasal spray Place 2 sprays into both nostrils daily. Patient taking differently: Place 1 spray into both nostrils daily.  04/14/18 04/14/19 Yes Erick Colace, NP  gabapentin (NEURONTIN) 600 MG tablet Take 600 mg by mouth 2 (two) times daily.    Yes [provider]  hydrOXYzine (VISTARIL) 50 MG capsule Take 50 mg by mouth at bedtime.   Yes [provider]  Insulin Detemir (LEVEMIR FLEXTOUCH) 100 UNIT/ML Pen Inject 30 Units into the skin 2 (two) times daily. 07/16/18  Yes Nita Sells, MD  levothyroxine (SYNTHROID, LEVOTHROID) 125 MCG tablet Take 125 mcg by mouth daily before  breakfast.   Yes [provider]  lisinopril (PRINIVIL,ZESTRIL) 2.5 MG tablet Take 2.5 mg by mouth daily.   Yes [provider]  metFORMIN (GLUCOPHAGE) 1000 MG tablet Take 1,000 mg by mouth 2 (two) times daily with a meal.   Yes [provider]  ondansetron (ZOFRAN) 4 MG tablet Take 4 mg by mouth every 8 (eight) hours as needed for nausea or vomiting.   Yes [provider]  pantoprazole (PROTONIX) 40 MG tablet TAKE ONE TABLET BY MOUTH TWICE A DAY BEFORE MEALS Patient taking differently: Take 40 mg by mouth daily.  04/01/17  Yes [name], MD  traZODone (DESYREL) 100 MG tablet Take 50 mg by mouth at bedtime.    Yes [provider]  cholestyramine (QUESTRAN) 4 g packet Take 1 packet (4 g total) by mouth every other day. Patient not taking: Reported on 07/05/2018 01/08/18   Nita Sells, MD  metoprolol tartrate (LOPRESSOR) 25 MG tablet Take 0.5 tablets (12.5 mg total) by mouth 2 (two) times daily. Patient not taking: Reported on 07/14/2018 01/06/18   Nita Sells,  MD    Family History Family History  Adopted: Yes  Problem Relation Age of Onset  . Autism Son   . ADD / ADHD Son   . Apraxia Son     Social History Social History   Tobacco Use  . Smoking status: Former Smoker    Years: 4.00    Last attempt to quit: 05/22/2017    Years since quitting: 1.1  . Smokeless tobacco: Never Used  Substance Use Topics  . Alcohol use: Yes    Comment: RARE  . Drug use: No    Types: Marijuana     Allergies   Bactrim [sulfamethoxazole-trimethoprim] and Penicillins   Review of Systems Review of Systems  All other systems reviewed and are negative.    Physical Exam Updated Vital Signs BP 137/76 (BP Location: Right Arm)   Pulse (!) 101   Temp 99 F (37.2 C) (Oral)   Resp 18   SpO2 97%   Physical Exam  Nursing note and vitals reviewed.  Obese 42 year old female, resting comfortably and in no acute distress. Vital signs  are significant for borderline elevated heart rate. Oxygen saturation is 97%, which is normal. Head is normocephalic and atraumatic. PERRLA, EOMI. Oropharynx is clear. Neck is nontender and supple without adenopathy or JVD.  Tracheostomy in place. Back is nontender in the midline.  There is mild bilateral CVA tenderness, worse on the right. Lungs are clear without rales, wheezes, or rhonchi. Chest is nontender. Heart has regular rate and rhythm without murmur. Abdomen is soft, flat, nontender without masses or hepatosplenomegaly and peristalsis is normoactive. Extremities have trace edema, full range of motion is present. Skin is warm and dry without rash. Neurologic: Mental status is normal, cranial nerves are intact, there are no motor or sensory deficits.  ED Treatments / Results  Labs (all labs ordered are listed, but only abnormal results are displayed) Labs Reviewed  URINALYSIS, ROUTINE W REFLEX MICROSCOPIC - Abnormal; Notable for the following components:      Result Value   Color, Urine AMBER (*)    Glucose, UA >=500 (*)    Hgb urine dipstick MODERATE (*)    Protein, ur 100 (*)    Nitrite POSITIVE (*)    Leukocytes, UA TRACE (*)    Bacteria, UA RARE (*)    All other components within normal limits  CBC WITH DIFFERENTIAL/PLATELET - Abnormal; Notable for the following components:   RBC 3.71 (*)    Hemoglobin 10.7 (*)    HCT 33.9 (*)    All other components within normal limits  BASIC METABOLIC PANEL - Abnormal; Notable for the following components:   Glucose, Bld 234 (*)    Creatinine, Ser 1.14 (*)    Calcium 8.6 (*)    GFR calc non Af Amer 58 (*)    All other components within normal limits  URINE CULTURE   Procedures Procedures   Medications Ordered in ED Medications - No data to display   Initial Impression / Assessment and Plan / ED Course  I have reviewed the triage vital signs and the nursing notes.  Pertinent labs & imaging results that were available  during my care of the patient were reviewed by me and considered in my medical decision making (see chart for details).  Decreased urine output.  Nursing reports bladder scans are showing little urine in the bladder.  She did urinate once, providing 100 mL of urine.  Will check metabolic panel to see if renal function  is stable since hospitalization.  Old records are reviewed confirming recent hospitalization for acute kidney injury.  Urine culture at that time grew out yeast, and she was discharged on no antibiotics.  Today urine has 21-50 WBCs as well as 21-50 RBCs but only rare bacteria.  Will send for culture, but I do not see an indication to start antibiotics at this point.  Labs show mild anemia which is unchanged from baseline.  BUN has gone up slightly from hospital discharge, but not enough to warrant hospital admission.  This was explained to the patient.  Recommended that she force fluids and have creatinine rechecked in 2 days.  Return sooner if having problems.  Patient expresses understanding.  Final Clinical Impressions(s) / ED Diagnoses   Final diagnoses:  Oliguria  Normochromic normocytic anemia    ED Discharge Orders    None       Delora Fuel, MD 13/24/40 [phone]

## 2018-07-19 NOTE — ED Notes (Signed)
Patient to restroom to attempt urine sample.

## 2018-07-21 LAB — URINE CULTURE

## 2018-07-24 ENCOUNTER — Ambulatory Visit (HOSPITAL_COMMUNITY)
Admission: RE | Admit: 2018-07-24 | Discharge: 2018-07-24 | Disposition: A | Discharge status: Home / Self Care | Payer: Medicaid Other | Source: Ambulatory Visit | Attending: Acute Care | Admitting: Acute Care

## 2018-07-24 ENCOUNTER — Inpatient Hospital Stay (HOSPITAL_COMMUNITY): Admission: RE | Admit: 2018-07-24 | Payer: Self-pay | Source: Ambulatory Visit

## 2018-07-24 ENCOUNTER — Ambulatory Visit (HOSPITAL_COMMUNITY): Payer: Self-pay

## 2018-07-24 DIAGNOSIS — G4733 Obstructive sleep apnea (adult) (pediatric): Secondary | ICD-10-CM | POA: Diagnosis not present

## 2018-07-24 DIAGNOSIS — J302 Other seasonal allergic rhinitis: Secondary | ICD-10-CM | POA: Diagnosis not present

## 2018-07-24 DIAGNOSIS — J386 Stenosis of larynx: Secondary | ICD-10-CM | POA: Insufficient documentation

## 2018-07-24 DIAGNOSIS — R0982 Postnasal drip: Secondary | ICD-10-CM | POA: Insufficient documentation

## 2018-07-24 DIAGNOSIS — Z930 Tracheostomy status: Secondary | ICD-10-CM

## 2018-07-24 DIAGNOSIS — E662 Morbid (severe) obesity with alveolar hypoventilation: Secondary | ICD-10-CM

## 2018-07-24 DIAGNOSIS — Z430 Encounter for attention to tracheostomy: Secondary | ICD-10-CM | POA: Diagnosis not present

## 2018-07-24 NOTE — Progress Notes (Addendum)
[name]   Reason for visit:  Trach occluded  HPI:  [name] presents today for concern for trach occlusion.  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  Last seen in [name] on September/18 for routine trach care.  She has been in her usual state of health, until approximately 2 to 3 days ago when she began to note her trach being plugged, she typically recognizes this by being a little more short of breath, as well as being able to phonate without Passy-Muir valve.  On a good note [name] is lost over 30 pounds since her last hospital admit and is making progress towards more weight loss.  She is also asking me today about the process required for decannulation ROS  Review of Systems - History obtained from the patient General ROS: positive for  - weight loss negative for - chills, fatigue, fever, hot flashes, malaise, night sweats or sleep disturbance Psychological ROS: negative ENT ROS: positive for - nasal congestion, nasal discharge and sneezing negative for - epistaxis, headaches, hearing change, oral lesions, sinus pain or sore throat Allergy and Immunology ROS: positive for - nasal congestion and postnasal drip Hematological and Lymphatic ROS: negative Endocrine ROS: negative Respiratory ROS: no cough, shortness of breath, or wheezing Cardiovascular ROS: no chest pain or dyspnea on exertion Gastrointestinal ROS: no abdominal pain, change in bowel habits, or black or bloody stools Genito-Urinary ROS: no dysuria, trouble voiding, or hematuria Musculoskeletal ROS: negative Neurological ROS: no TIA or stroke symptoms  Vital signs:  Heart rate 110 respirations 24 room air saturations 96% Exam:  Physical Exam  Constitutional: She is oriented to person, place, and time. Vital signs are normal. She appears well-nourished. She is active and cooperative.   Non-toxic appearance. She does not have a sickly appearance. She does not appear ill.  HENT:  Head: Normocephalic and atraumatic.  Nose: Nose normal. No mucosal edema or rhinorrhea.  She currently has a size 6 proximal XLT tracheostomy.  Tracheostomy stoma is unremarkable site is clean and dry without discharge  Cardiovascular: Normal rate, regular rhythm and normal heart sounds.  Pulmonary/Chest: Effort normal and breath sounds normal. No accessory muscle usage. No respiratory distress.  Abdominal: Soft. Normal appearance. There is no tenderness.  Neurological: She is alert and oriented to person, place, and time. She has normal strength.  Skin: Skin is warm, dry and intact.  Psychiatric: She has a normal mood and affect. Her speech is normal and behavior is normal. Thought content normal. Cognition and memory are normal.    Trach change/procedure:  The existing size 6 tracheostomy was removed, tracheostomy stoma inspected, stoma site was unremarkable.  She was premedicated with 1% lidocaine jelly, following this a new size 6 cuffless proximal XLT tracheostomy was placed without difficulty      Impression/dx  Tracheostomy dependence Obstructive sleep apnea Obesity hypoventilation syndrome Obesity Seasonal allergies Postnasal drip Discussion  [name] is doing well from a tracheostomy standpoint.  Even more encouraging has been her recent weight loss of over 30 pounds.  She would like to get her tracheostomy removed, and I think that this is a possibility if her weight loss continues.  The biggest issue we have at this point is intermittent tracheostomy plugging, seemingly secondary to her marked postnasal drip. Plan  Continue to encourage weight loss When we have her less than 200 pounds I think it would be reasonable to try  capping trials, if she can tolerate this then proceed with repeat sleep study If she cannot tolerate this given her prior history of laryngeal edema will need to her to  go back to ENT before we can proceed further Will change her follow-ups to every 6 weeks given the propensity of her tracheostomy to occlude  Erick Colace ACNP-BC [place] Pager # [phone] OR # [phone] if no answer     Visit time: 34 minutes.

## 2018-07-24 NOTE — Progress Notes (Signed)
Tracheostomy Procedure Note  [name] [id number] November 06, 1975  Pre Procedure Tracheostomy Information  Trach Brand: Shiley Size: 6xl Style: Proximal and Uncuffed Secured by: Velcro   Procedure: trach change    Post Procedure Tracheostomy Information  Trach Brand: Shiley Size: 6xl Style: Proximal and Uncuffed Secured by: Velcro   Post Procedure Evaluation:  ETCO2 positive color change from yellow to purple : Yes.   Vital signs: pulse 110, respirations 24 and pulse oximetry 96% on RA with PMV Patients current condition: stable Complications: No apparent complications Trach site exam: clean, dry Wound care done: 4 x 4 gauze Patient did tolerate procedure well.   Education: None  Prescription needs: None   See back in trach clinic in 6 weeks

## 2018-07-31 ENCOUNTER — Ambulatory Visit (HOSPITAL_COMMUNITY): Payer: Self-pay

## 2018-08-15 ENCOUNTER — Telehealth: Payer: Self-pay

## 2018-08-15 ENCOUNTER — Ambulatory Visit (HOSPITAL_COMMUNITY)
Admission: RE | Admit: 2018-08-15 | Discharge: 2018-08-15 | Disposition: A | Discharge status: Home / Self Care | Payer: Medicaid Other | Source: Ambulatory Visit | Attending: Acute Care | Admitting: Acute Care

## 2018-08-15 ENCOUNTER — Other Ambulatory Visit: Payer: Self-pay | Admitting: Acute Care

## 2018-08-15 DIAGNOSIS — Z430 Encounter for attention to tracheostomy: Secondary | ICD-10-CM | POA: Insufficient documentation

## 2018-08-15 DIAGNOSIS — Z930 Tracheostomy status: Secondary | ICD-10-CM | POA: Diagnosis not present

## 2018-08-15 MED ORDER — AZELASTINE HCL 0.1 % NA SOLN: 2.0000 | Freq: Two times a day (BID) | NASAL | Status: DC

## 2018-08-15 NOTE — Telephone Encounter (Signed)
Left a VM for [name] in regards to the 12-week PREP at the [facility] and asked her to call me back. She had initially called me last month (after hearing about it from her dad) about participating in the exercise program and was wanting to enroll in one of the new upcoming classes in Jan 2020.

## 2018-08-15 NOTE — Progress Notes (Signed)
[facility]   Reason for visit:  Difficulty breathing; trach occluded. Sick-working HPI:   42 year old female. Well known to me. Trach dependent d/t severe OSA/OHS. Called earlier in week reporting difficulty w/ her t trach. Specifically: occluding. Seems like this is a recurrent issue for her.  ROS  Review of Systems - History obtained from the patient General ROS: negative ENT ROS: positive for - nasal discharge Hematological and Lymphatic ROS: negative Endocrine ROS: negative Respiratory ROS: no cough, shortness of breath, or wheezing Cardiovascular ROS: no chest pain or dyspnea on exertion Gastrointestinal ROS: no abdominal pain, change in bowel habits, or black or bloody stools Genito-Urinary ROS: no dysuria, trouble voiding, or hematuria Musculoskeletal ROS: negative Neurological ROS: no TIA or stroke symptoms  Vital signs:  Reviewed  Exam:  Physical Exam Vitals signs reviewed.  Constitutional:      Appearance: Normal appearance. She is obese.  HENT:     Head: Normocephalic and atraumatic.     Nose: Rhinorrhea present. No congestion.     Mouth/Throat:     Mouth: Mucous membranes are moist.     Pharynx: No oropharyngeal exudate.  Eyes:     Pupils: Pupils are equal, round, and reactive to light.  Neck:     Musculoskeletal: Normal range of motion.  Cardiovascular:     Rate and Rhythm: Normal rate.     Pulses: Normal pulses.     Comments: Trach site unremarkable  Pulmonary:     Effort: Pulmonary effort is normal. No respiratory distress.     Breath sounds: Normal breath sounds.  Abdominal:     General: Bowel sounds are normal.     Palpations: Abdomen is soft.  Musculoskeletal: Normal range of motion.  Skin:    General: Skin is warm and dry.     Capillary Refill: Capillary refill takes less than 2 seconds.  Neurological:     General: No focal deficit present.     Mental Status: She is alert.  Psychiatric:        Mood and Affect: Mood  normal.     Trach change/procedure: 6 prox cuffless trach changed w/out difficulty       Impression/dx  Tracheostomy Dependence  Severe OSA/OHS H/o Subglottic stenosis  Seasonal allergies  Post-nasal gtt.  Tracheostomy occlusion  Discussion  Recurrent mucous plugging seems to be one of Khushboo's biggest issues from a trach stand-point.  Plan  Will see her q4 weeks Cont routine trach care     Visit time: 23 minutes.   Erick Colace ACNP-BC [place]

## 2018-08-15 NOTE — Progress Notes (Signed)
Tracheostomy Procedure Note  KINGA CASSAR [id number] 1975/12/15  Pre Procedure Tracheostomy Information  Trach Brand: Shiley Size: 6.0 XLT Style: Proximal and Uncuffed Secured by: Velcro VSS  BP 150/66  Pulse 123  Respirations 26 SaO2 97% on room air  Procedure: trach cleaning and trach change     Post Procedure Tracheostomy Information  Trach Brand: Shiley Size: 6.0 XLT Style: Proximal and Uncuffed Secured by: Velcro   Post Procedure Evaluation:  ETCO2 positive color change from yellow to purple : Yes.   Vital signs:blood pressure 113/71, pulse 113, respirations 24  and pulse oximetry 99 % on room air Patients current condition: stable Complications: No apparent complications Trach site exam: clean, dry, healed Wound care done: dry, clean and 4 x 4 gauze Patient did tolerate procedure well.  Sputum sample obtained  Evonnie Dawes valve returned to patient  Post Trach Change  BP 117/66  Pulse 106  Respirations 22  SaO2 99% on RA  Education: Trach cleaning   Prescription needs: none    Additional needs: none

## 2018-08-18 LAB — CULTURE, RESPIRATORY W GRAM STAIN

## 2018-08-18 LAB — CULTURE, RESPIRATORY

## 2018-08-28 ENCOUNTER — Telehealth: Payer: Self-pay | Admitting: *Deleted

## 2018-08-28 ENCOUNTER — Encounter: Payer: Self-pay | Admitting: Neurology

## 2018-08-28 ENCOUNTER — Ambulatory Visit: Payer: Medicaid Other | Admitting: Neurology

## 2018-08-28 NOTE — Telephone Encounter (Signed)
Pt called and canceled 8:30am new patient appt at 8:04am.  Stated she was sick and unable to make it.

## 2018-09-01 ENCOUNTER — Encounter (HOSPITAL_COMMUNITY): Payer: Self-pay | Admitting: Emergency Medicine

## 2018-09-01 ENCOUNTER — Emergency Department (HOSPITAL_COMMUNITY)
Admission: EM | Admit: 2018-09-01 | Discharge: 2018-09-01 | Disposition: A | Discharge status: Home / Self Care | Payer: Medicaid Other | Attending: Emergency Medicine | Admitting: Emergency Medicine

## 2018-09-01 DIAGNOSIS — R0602 Shortness of breath: Secondary | ICD-10-CM | POA: Diagnosis present

## 2018-09-01 DIAGNOSIS — Z430 Encounter for attention to tracheostomy: Secondary | ICD-10-CM | POA: Diagnosis not present

## 2018-09-01 DIAGNOSIS — E249 Cushing's syndrome, unspecified: Secondary | ICD-10-CM | POA: Diagnosis not present

## 2018-09-01 DIAGNOSIS — I10 Essential (primary) hypertension: Secondary | ICD-10-CM | POA: Insufficient documentation

## 2018-09-01 DIAGNOSIS — Z79891 Long term (current) use of opiate analgesic: Secondary | ICD-10-CM | POA: Diagnosis not present

## 2018-09-01 DIAGNOSIS — E119 Type 2 diabetes mellitus without complications: Secondary | ICD-10-CM | POA: Diagnosis not present

## 2018-09-01 DIAGNOSIS — J9503 Malfunction of tracheostomy stoma: Secondary | ICD-10-CM

## 2018-09-01 NOTE — Discharge Instructions (Signed)
We were able to successfully change your trach. Continue to use as directed. Return to the ED with any new or worsening symptoms.

## 2018-09-01 NOTE — ED Triage Notes (Addendum)
Patient reports that her trach became clogged yesterday, states that she can normally get it unclogged but was unable to. Reports sob due to trach being clogged. Respirations mildly labored but patient able to speak in complete sentences.

## 2018-09-01 NOTE — ED Provider Notes (Signed)
Emergency Department Provider Note   I have reviewed the triage vital signs and the nursing notes.   HISTORY  Chief Complaint Tracheostomy Tube Change and Shortness of Breath   HPI [name] is a 42 y.o. female with PMH of Cushing's disease, HLD, HTN, OSA, and DM with tracheostomy at baseline presents to the emergency department for evaluation of increased shortness of breath in the setting of clogged tracheostomy.  Patient states that she is due to have the trach changed in early January.  She began experiencing symptoms yesterday and tried to unclog it at home but was unsuccessful.  When her shortness of breath increased she presents to the emergency department.  She states that the trach clinic is closed today so she cannot go there.  She denies any fevers or chills.  Denies any chest pain.  No radiation of symptoms or other modifying factors.   Past Medical History:  Diagnosis Date  . Anxiety   . Complication of anesthesia    Pt. states takes long time to wake up from it.   . Cushing's disease ([place])   . Depression   . Diabetes ([place])   . Hyperlipidemia   . Hyperlipidemia   . Hypertension   . Morbid obesity ([place])   . Osteoporosis 07/19/2015  . Periprosthetic fracture 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[place]), R tibial plateau  07/18/2015  . Sleep apnea   . Uncontrolled diabetes mellitus with diabetic neuropathy, with long-term current use of insulin ([place]) 07/16/2015  . Vitamin D deficiency 07/19/2015    Patient Active Problem List   Diagnosis Date Noted  . Pyelonephritis 07/16/2018  . UTI (urinary tract infection) 07/14/2018  . Sepsis secondary to UTI ([place]) 07/05/2018  . AKI (acute kidney injury) ([place]) 07/05/2018  . Allergic reaction caused by a drug 07/05/2018  . Tracheostomy complication ([place])   . Tracheostomy dependence ([place])   . OSA (obstructive sleep apnea)   . Depression   . Acute respiratory failure with hypoxemia ([place])   . SOB (shortness of breath)   .  Tracheostomy status ([place])   . Hoarse voice quality   . Hypomagnesemia   . Shortness of breath   . Major depressive disorder, recurrent episode, severe ([place])   . Intentional overdose of drug in tablet form ([place]) 11/20/2017  . Intentional drug overdose ([place])   . Seizure ([place])   . Acute respiratory failure with hypercapnia ([employer])   . Status epilepticus ([place])   . Spondylosis without myelopathy or radiculopathy, cervical region 10/29/2017  . Abdominal pain, epigastric   . LUQ abdominal pain   . Nausea and vomiting   . Gastroparesis   . Hypertriglyceridemia 07/22/2015  . Diabetes mellitus due to underlying condition, uncontrolled, with diabetic neuropathy, with long-term current use of insulin ([place])   . Vitamin D deficiency 07/19/2015  . 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 Osteoporosis 07/19/2015  . Periprosthetic fracture 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[place]), R tibial plateau  07/18/2015  . Fracture, tibial plateau 07/16/2015  . Uncontrolled diabetes mellitus with diabetic neuropathy, with long-term current use of insulin ([place]) 07/16/2015  . Leukocytosis 07/16/2015  . Hypertension associated with diabetes ([place]) 07/16/2015  . Obesity hypoventilation syndrome ([place]) 04/20/2014  . Hyperlipidemia associated with type 2 diabetes mellitus ([place]) 07/31/2012  . Cushing disease ([place]) 07/31/2012  . Hypothyroid 07/31/2012    Past Surgical History:  Procedure Laterality Date  . ANTERIOR TALOFIBULAR LIGAMENT REPAIR Left 11/15/2014   Procedure: ANTERIOR TALOFIBULAR LIGAMENT  REPAIR;  Surgeon: Jana Half, DPM;  Location: [facility];  Service: Podiatry;  Laterality: Left;  . CESAREAN SECTION  dec 1997/  06-03-2001/   01-01-2005   BILATERAL TUBAL LIGATION WITH LAST ONE  . DILATION AND CURETTAGE OF UTERUS  1995   WITH SUCTION  . DIRECT LARYNGOSCOPY N/A 12/18/2017   Procedure: DIRECT LARYNGOSCOPY;  Surgeon: Izora Gala, MD;  Location:  [place] [facility] [place];  Service: ENT;  Laterality: N/A;  . ESOPHAGOGASTRODUODENOSCOPY (EGD) WITH PROPOFOL N/A 10/04/2016   Procedure: ESOPHAGOGASTRODUODENOSCOPY (EGD) WITH PROPOFOL;  Surgeon: [name], MD;  [facility];  Service: Endoscopy;  Laterality: N/A;  . LAPAROSCOPIC CHOLECYSTECTOMY  09-25-2005  . ORIF TIBIA PLATEAU Right 07/19/2015   Procedure: OPEN REDUCTION INTERNAL FIXATION (ORIF) RIGHT TIBIAL PLATEAU;  Surgeon: Altamese [place], MD;  Location: [facility];  Service: Orthopedics;  Laterality: Right;  . PARTIAL KNEE ARTHROPLASTY Right 04/19/2014   Procedure: RIGHT UNI KNEE ARTHROPLASTY MEDIALLY ;  Surgeon: Mauri Pole, MD;  [facility];  Service: Orthopedics;  Laterality: Right;  . TRACHEOSTOMY TUBE PLACEMENT N/A 11/28/2017   Procedure: TRACHEOSTOMY;  Surgeon: Izora Gala, MD;  [facility];  Service: ENT;  Laterality: N/A;  . TUBAL LIGATION      Allergies Bactrim [sulfamethoxazole-trimethoprim] and Penicillins  Family History  Adopted: Yes  Problem Relation Age of Onset  . Autism Son   . ADD / ADHD Son   . Apraxia Son     Social History Social History   Tobacco Use  . Smoking status: Former Smoker    Years: 4.00    Last attempt to quit: 05/22/2017    Years since quitting: 1.2  . Smokeless tobacco: Never Used  Substance Use Topics  . Alcohol use: Yes    Comment: RARE  . Drug use: No    Types: Marijuana    Review of Systems  Constitutional: No fever/chills Eyes: No visual changes.  ENT: No sore throat. Positive trach change.  Cardiovascular: Denies chest pain. Respiratory: Positive shortness of breath. Gastrointestinal: No abdominal pain.  No nausea, no vomiting.  No diarrhea.  No constipation. Genitourinary: Negative for dysuria. Musculoskeletal: Negative for back pain. Skin: Negative for rash. Neurological: Negative for headaches, focal weakness or numbness.  10-point ROS otherwise negative.  ____________________________________________   PHYSICAL  EXAM:  VITAL SIGNS: ED Triage Vitals  Enc Vitals Group     BP 09/01/18 0935 (!) 145/79     Pulse Rate 09/01/18 0931 (!) 107     Resp 09/01/18 0935 20     Temp 09/01/18 0935 98 F (36.7 C)     Temp src --      SpO2 09/01/18 0931 96 %     Pain Score 09/01/18 0934 0   Constitutional: Alert and oriented. Well appearing and in no acute distress. Eyes: Conjunctivae are norma Head: Atraumatic. Nose: No congestion/rhinnorhea. Mouth/Throat: Mucous membranes are moist.  Oropharynx non-erythematous. Neck: No stridor. Trach in place. No surrounding erythema. No bleeding.  Cardiovascular: Tachycardia. Good peripheral circulation. Grossly normal heart sounds.   Respiratory: Normal respiratory effort.  No retractions. Lungs CTAB. Gastrointestinal: No distention.  Musculoskeletal:No gross deformities of extremities. Neurologic:  Normal speech and language.  Skin:  Skin is warm, dry and intact. No rash noted.  ____________________________________________  RADIOLOGY  None ____________________________________________   PROCEDURES  Procedure(s) performed:   Procedures  None ____________________________________________   INITIAL IMPRESSION / ASSESSMENT AND PLAN / ED COURSE  Pertinent labs & imaging results that were available during  my care of the patient were reviewed by me and considered in my medical decision making (see chart for details).  Patient presents to the emergency department with clogged tracheostomy.  Saturations are normal.  Evaluated with respiratory therapy.  Unable to suction significantly to get relief.  Plan for changing tracheostomy at bedside with RT. Lungs are CTABL. No CXR at this time.   Trach replaced by RT. See note. Patient discharged in stable condition.  ____________________________________________  FINAL CLINICAL IMPRESSION(S) / ED DIAGNOSES  Final diagnoses:  Tracheostomy malfunction [facility])    Note:  This document was prepared using Dragon voice  recognition software and may include unintentional dictation errors.  Nanda Quinton, MD Emergency Medicine    Long, Wonda Old[facility], MD 09/01/18 [phone]

## 2018-09-01 NOTE — Procedures (Signed)
Tracheostomy Change Note  Patient Details:   Name: [name] DOB: 02/25/1976 MRN: [id number]    Airway Documentation:     Evaluation  O2 sats: stable throughout Complications: No apparent complications Patient did tolerate procedure well.    SPO2 100% on RA, ETCO2 good color change  Gonzella Lex 09/01/2018, 11:10 AM

## 2018-09-11 ENCOUNTER — Ambulatory Visit (HOSPITAL_COMMUNITY): Payer: Self-pay

## 2018-09-18 ENCOUNTER — Ambulatory Visit (HOSPITAL_COMMUNITY): Payer: Self-pay

## 2018-09-25 ENCOUNTER — Ambulatory Visit (HOSPITAL_COMMUNITY)
Admission: RE | Admit: 2018-09-25 | Discharge: 2018-09-25 | Disposition: A | Discharge status: Home / Self Care | Payer: Medicaid Other | Source: Ambulatory Visit | Attending: Acute Care | Admitting: Acute Care

## 2018-09-25 ENCOUNTER — Other Ambulatory Visit: Payer: Self-pay | Admitting: Acute Care

## 2018-09-25 DIAGNOSIS — J386 Stenosis of larynx: Secondary | ICD-10-CM | POA: Insufficient documentation

## 2018-09-25 DIAGNOSIS — E662 Morbid (severe) obesity with alveolar hypoventilation: Secondary | ICD-10-CM | POA: Diagnosis not present

## 2018-09-25 DIAGNOSIS — J302 Other seasonal allergic rhinitis: Secondary | ICD-10-CM

## 2018-09-25 DIAGNOSIS — Z930 Tracheostomy status: Secondary | ICD-10-CM

## 2018-09-25 MED ORDER — AZELASTINE HCL 0.1 % NA SOLN: 2.0000 | Freq: Two times a day (BID) | NASAL | Status: DC | PRN

## 2018-09-25 MED ORDER — AZELASTINE HCL 0.1 % NA SOLN: 2.0000 | Freq: Two times a day (BID) | NASAL | 6 refills | Status: DC

## 2018-09-25 MED ORDER — AZELASTINE HCL 0.1 % NA SOLN: 2.0000 | Freq: Two times a day (BID) | NASAL | Status: DC

## 2018-09-25 NOTE — Progress Notes (Signed)
[facility]   Reason for visit:  Routine trach change  HPI:   43 year old female. Well known to me. Trach dependent d/t severe OSA/OHS. Returns today for trach change.   ROS  Review of Systems - History obtained from the patient General ROS: negative negative for - chills, fatigue, fever, night sweats or sleep disturbance ENT ROS: negative Hematological and Lymphatic ROS: negative Endocrine ROS: negative Respiratory ROS: no cough, shortness of breath, or wheezing Cardiovascular ROS: no chest pain or dyspnea on exertion Gastrointestinal ROS: no abdominal pain, change in bowel habits, or black or bloody stools Genito-Urinary ROS: no dysuria, trouble voiding, or hematuria Musculoskeletal ROS: negative Neurological ROS: no TIA or stroke symptoms  Vital signs:  Reviewed  Exam:  Physical Exam Vitals signs reviewed.  Constitutional:      General: She is not in acute distress.    Appearance: Normal appearance. She is obese. She is not ill-appearing, toxic-appearing or diaphoretic.  HENT:     Head: Normocephalic and atraumatic.     Nose: Nose normal. No congestion.     Mouth/Throat:     Mouth: Mucous membranes are moist.  Eyes:     Pupils: Pupils are equal, round, and reactive to light.  Neck:     Musculoskeletal: Normal range of motion.     Comments: Trach stoma unremarkable  Cardiovascular:     Rate and Rhythm: Normal rate and regular rhythm.  Pulmonary:     Effort: Pulmonary effort is normal. No tachypnea, accessory muscle usage or respiratory distress.     Breath sounds: Normal breath sounds.  Abdominal:     General: Bowel sounds are normal. There is no distension.  Musculoskeletal: Normal range of motion.  Skin:    General: Skin is warm and dry.     Capillary Refill: Capillary refill takes less than 2 seconds.  Neurological:     General: No focal deficit present.     Mental Status: She is alert.  Psychiatric:        Attention and  Perception: Attention and perception normal.        Speech: Speech normal.        Behavior: Behavior normal.        Thought Content: Thought content normal.        Cognition and Memory: Cognition normal.     Size 6 changed w/out difficulty.       Impression/dx  Tracheostomy Dependence  Severe OSA/OHS H/o Subglottic stenosis  Seasonal allergies  Post-nasal gtt.  Tracheostomy occlusion  Discussion  No sig change. Motivated for weight loss. We discussed at length baby steps for weight loss. For short term I suggested: write goals daily, no sugared drinks and consider using a dietary diary or app such as my fitness pal or similar on her phone.   Plan  Return in 4 weeks for trach change Write down daily goals for weight loss.  Goal for next 4 weeks; no sugared drinks limit juice to only when she takes her Questran EOD   Visit time: 22 minutes.   Erick Colace ACNP-BC [place]

## 2018-09-25 NOTE — Progress Notes (Signed)
Tracheostomy Procedure Note  [name] [id number] 1976/02/01  Pre Procedure Tracheostomy Information  Trach Brand: Shiley Size: 6 xlt Style: Proximal and Uncuffed Secured by: Velcro   Procedure: trach change    Post Procedure Tracheostomy Information  Trach Brand: Shiley Size: 6 xlt Style: Proximal and Uncuffed Secured by: Velcro   Post Procedure Evaluation:  ETCO2 positive color change from yellow to purple : Yes.   Vital signs: pulse 115, respirations 18 and pulse oximetry 98% on RA with PMV Patients current condition: stable Complications: No apparent complications Trach site exam: clean, dry Wound care done: 4 x 4 gauze Patient did tolerate procedure well.   Education: None  See back in trach clinic in 4 weeks

## 2018-10-12 NOTE — Progress Notes (Signed)
[facility] Report   Patient Details  Name: [name] MRN: [id number] Date of Birth: Jun 18, 1976 Age: 43 y.o. PCP: [place]:   10/07/18 2213  BP: 120/82  Pulse: (!) 106  Resp: 20  SpO2: 98%  Weight: 277 lb (125.6 kg)     [facility] Eval - 10/07/18 2214      Referral    Referring Provider  self/Pete Kary Kos, NP    Reason for referral  Inactivity;Obesitity/Overweight;Other    Program Start Date  10/08/18      Measurement   Waist Circumference  51 inches    Hip Circumference  60 inches    Body fat  --   E4 = "too high to register"     Information for Trainer   Goals  "to lose 5lbs, lose the trach, learn better ways to cope w/life's stressors"    Current Exercise  walk 1-2x/wk    Orthopedic Concerns  RPKR, Lt ankle, bulging C7    Pertinent Medical History  see chart    Current Barriers  "meltdowns, can get very down on myself"    Medications that affect exercise  Oxgen   at night     Mobility and Daily Activities   I find it easy to walk up or down two or more flights of stairs.  1    I have no trouble taking out the trash.  4    I do housework such as vacuuming and dusting on my own without difficulty.  4    I can easily lift a gallon of milk (8lbs).  4    I can easily walk a mile.  1    I have no trouble reaching into high cupboards or reaching down to pick up something from the floor.  4    I do not have trouble doing out-door work such as Armed forces logistics/support/administrative officer, raking leaves, or gardening.  2      Mobility and Daily Activities   I feel younger than my age.  2    I feel independent.  4    I feel energetic.  2    I live an active life.   3    I feel strong.  1    I feel healthy.  1    I feel active as other people my age.  1      How fit and strong are you.   Fit and Strong Total Score  34      Past Medical History:  Diagnosis Date  . Anxiety   . Complication of anesthesia    Pt. states takes long time to wake up from  it.   . Cushing's disease ([place])   . Depression   . Diabetes ([place])   . Hyperlipidemia   . Hyperlipidemia   . Hypertension   . Morbid obesity ([place])   . Osteoporosis 07/19/2015  . Periprosthetic fracture 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[place]), R tibial plateau  07/18/2015  . Sleep apnea   . Uncontrolled diabetes mellitus with diabetic neuropathy, with long-term current use of insulin ([place]) 07/16/2015  . Vitamin D deficiency 07/19/2015   Past Surgical History:  Procedure Laterality Date  . ANTERIOR TALOFIBULAR LIGAMENT REPAIR Left 11/15/2014   Procedure: ANTERIOR TALOFIBULAR LIGAMENT REPAIR;  Surgeon: Jana Half, DPM;  Location: [facility];  Service: Podiatry;  Laterality: Left;  . CESAREAN SECTION  dec 1997/  06-03-2001/   01-01-2005   BILATERAL  TUBAL LIGATION WITH LAST ONE  . DILATION AND CURETTAGE OF UTERUS  1995   WITH SUCTION  . DIRECT LARYNGOSCOPY N/A 12/18/2017   Procedure: DIRECT LARYNGOSCOPY;  Surgeon: Izora Gala, MD;  Location: [facility];  Service: ENT;  Laterality: N/A;  . ESOPHAGOGASTRODUODENOSCOPY (EGD) WITH PROPOFOL N/A 10/04/2016   Procedure: ESOPHAGOGASTRODUODENOSCOPY (EGD) WITH PROPOFOL;  Surgeon: [name], MD;  [facility];  Service: Endoscopy;  Laterality: N/A;  . LAPAROSCOPIC CHOLECYSTECTOMY  09-25-2005  . ORIF TIBIA PLATEAU Right 07/19/2015   Procedure: OPEN REDUCTION INTERNAL FIXATION (ORIF) RIGHT TIBIAL PLATEAU;  Surgeon: Altamese [place], MD;  Location: [facility];  Service: Orthopedics;  Laterality: Right;  . PARTIAL KNEE ARTHROPLASTY Right 04/19/2014   Procedure: RIGHT UNI KNEE ARTHROPLASTY MEDIALLY ;  Surgeon: Mauri Pole, MD;  [facility];  Service: Orthopedics;  Laterality: Right;  . TRACHEOSTOMY TUBE PLACEMENT N/A 11/28/2017   Procedure: TRACHEOSTOMY;  Surgeon: Izora Gala, MD;  Location: [facility];  Service: ENT;  Laterality: N/A;  . TUBAL LIGATION     Social History   Tobacco Use  Smoking Status Former Smoker  . Years: 4.00  .  Last attempt to quit: 05/22/2017  . Years since quitting: 1.3  Smokeless Tobacco Never Used    [name] has registered and begun the 12-week, twice weekly PREP at the [facility] and has access to all the [facility] any time she'd like to exercise in addition to class days.     Vanita Ingles 10/12/2018, 10:19 PM

## 2018-10-15 NOTE — Progress Notes (Signed)
[facility] Report   Patient Details  Name: MAHINA SALATINO MRN: [id number] Date of Birth: [birth year] Age: 43 y.o. PCP: [place]:   10/15/18 2017  Weight: 278 lb (126.1 kg)      Past Medical History:  Diagnosis Date  . Anxiety   . Complication of anesthesia    Pt. states takes long time to wake up from it.   . Cushing's disease ([place])   . Depression   . Diabetes ([place])   . Hyperlipidemia   . Hyperlipidemia   . Hypertension   . Morbid obesity ([place])   . Osteoporosis 07/19/2015  . Periprosthetic fracture 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[place]), R tibial plateau  07/18/2015  . Sleep apnea   . Uncontrolled diabetes mellitus with diabetic neuropathy, with long-term current use of insulin ([place]) 07/16/2015  . Vitamin D deficiency 07/19/2015   Past Surgical History:  Procedure Laterality Date  . ANTERIOR TALOFIBULAR LIGAMENT REPAIR Left 11/15/2014   Procedure: ANTERIOR TALOFIBULAR LIGAMENT REPAIR;  Surgeon: Jana Half, DPM;  Location: [facility];  Service: Podiatry;  Laterality: Left;  . CESAREAN SECTION  dec 1997/  06-03-2001/   01-01-2005   BILATERAL TUBAL LIGATION WITH LAST ONE  . DILATION AND CURETTAGE OF UTERUS  1995   WITH SUCTION  . DIRECT LARYNGOSCOPY N/A 12/18/2017   Procedure: DIRECT LARYNGOSCOPY;  Surgeon: Izora Gala, MD;  Location: [facility];  Service: ENT;  Laterality: N/A;  . ESOPHAGOGASTRODUODENOSCOPY (EGD) WITH PROPOFOL N/A 10/04/2016   Procedure: ESOPHAGOGASTRODUODENOSCOPY (EGD) WITH PROPOFOL;  Surgeon: [name], MD;  [facility];  Service: Endoscopy;  Laterality: N/A;  . LAPAROSCOPIC CHOLECYSTECTOMY  09-25-2005  . ORIF TIBIA PLATEAU Right 07/19/2015   Procedure: OPEN REDUCTION INTERNAL FIXATION (ORIF) RIGHT TIBIAL PLATEAU;  Surgeon: Altamese [place], MD;  Location: [facility];  Service: Orthopedics;  Laterality: Right;  . PARTIAL KNEE ARTHROPLASTY Right 04/19/2014   Procedure: RIGHT UNI KNEE ARTHROPLASTY  MEDIALLY ;  Surgeon: Mauri Pole, MD;  [facility];  Service: Orthopedics;  Laterality: Right;  . TRACHEOSTOMY TUBE PLACEMENT N/A 11/28/2017   Procedure: TRACHEOSTOMY;  Surgeon: Izora Gala, MD;  Location: [facility];  Service: ENT;  Laterality: N/A;  . TUBAL LIGATION     Social History   Tobacco Use  Smoking Status Former Smoker  . Years: 4.00  . Last attempt to quit: 05/22/2017  . Years since quitting: 1.4  Smokeless Tobacco Never Used     Fun things you did since last meeting:"walked"  Vanita Ingles 10/15/2018, 8:17 PM

## 2018-10-19 ENCOUNTER — Emergency Department (HOSPITAL_COMMUNITY)
Admission: EM | Admit: 2018-10-19 | Discharge: 2018-10-20 | Disposition: A | Discharge status: Home / Self Care | Payer: Medicaid Other | Attending: Emergency Medicine | Admitting: Emergency Medicine

## 2018-10-19 ENCOUNTER — Other Ambulatory Visit: Payer: Self-pay

## 2018-10-19 DIAGNOSIS — E119 Type 2 diabetes mellitus without complications: Secondary | ICD-10-CM | POA: Diagnosis not present

## 2018-10-19 DIAGNOSIS — I10 Essential (primary) hypertension: Secondary | ICD-10-CM | POA: Diagnosis not present

## 2018-10-19 DIAGNOSIS — Z794 Long term (current) use of insulin: Secondary | ICD-10-CM | POA: Insufficient documentation

## 2018-10-19 DIAGNOSIS — Z430 Encounter for attention to tracheostomy: Secondary | ICD-10-CM

## 2018-10-19 DIAGNOSIS — Z79899 Other long term (current) drug therapy: Secondary | ICD-10-CM | POA: Diagnosis not present

## 2018-10-19 DIAGNOSIS — J9509 Other tracheostomy complication: Secondary | ICD-10-CM | POA: Insufficient documentation

## 2018-10-19 DIAGNOSIS — Z87891 Personal history of nicotine dependence: Secondary | ICD-10-CM | POA: Insufficient documentation

## 2018-10-19 NOTE — ED Provider Notes (Signed)
[facility] EMERGENCY DEPARTMENT Provider Note   CSN: [id number] Arrival date & time: 10/19/18  2311     History   Chief Complaint Chief Complaint  Patient presents with  . trach problems    HPI [name] is a 43 y.o. female.  Patient with history of HTN, HLD, DM, cushing's, tracheostomy presents with shortness of breath secondary to tracheal congestion. Patient is knowledgeable about her trach care and tried to suction at home without success. She also tried to change the inner cuff, but states that there is too much congestion present and the cuff folds or kinks. No fever, cough, significant nasal congestion. No pain. She has had the tracheostomy for one year and reports she is due and scheduled for exchange later this week.   The history is provided by the patient and the spouse. No language interpreter was used.    Past Medical History:  Diagnosis Date  . Anxiety   . Complication of anesthesia    Pt. states takes long time to wake up from it.   . Cushing's disease ([place])   . Depression   . Diabetes ([place])   . Hyperlipidemia   . Hyperlipidemia   . Hypertension   . Morbid obesity ([place])   . Osteoporosis 07/19/2015  . 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 Sleep apnea   . Uncontrolled diabetes mellitus with diabetic neuropathy, with long-term current use of insulin ([place]) 07/16/2015  . Vitamin D deficiency 07/19/2015    Patient Active Problem List   Diagnosis Date Noted  . Pyelonephritis 07/16/2018  . UTI (urinary tract infection) 07/14/2018  . Sepsis secondary to UTI ([place]) 07/05/2018  . AKI (acute kidney injury) ([place]) 07/05/2018  . Allergic reaction caused by a drug 07/05/2018  . Tracheostomy complication ([place])   . Tracheostomy dependence ([place])   . OSA (obstructive sleep apnea)   . Depression   . Acute respiratory failure with hypoxemia ([place])   . SOB (shortness of breath)   . Tracheostomy  status ([place])   . Hoarse voice quality   . Hypomagnesemia   . Shortness of breath   . Major depressive disorder, recurrent episode, severe ([place])   . Intentional overdose of drug in tablet form ([place]) 11/20/2017  . Intentional drug overdose ([place])   . Seizure ([place])   . Acute respiratory failure with hypercapnia ([place])   . Status epilepticus ([place])   . Spondylosis without myelopathy or radiculopathy, cervical region 10/29/2017  . Abdominal pain, epigastric   . LUQ abdominal pain   . Nausea and vomiting   . Gastroparesis   . Hypertriglyceridemia 07/22/2015  . Diabetes mellitus due to underlying condition, uncontrolled, with diabetic neuropathy, with long-term current use of insulin ([place])   . Vitamin D deficiency 07/19/2015  . 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 Osteoporosis 07/19/2015  . 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 Fracture, tibial plateau 07/16/2015  . Uncontrolled diabetes mellitus with diabetic neuropathy, with long-term current use of insulin ([place]) 07/16/2015  . Leukocytosis 07/16/2015  . Hypertension associated with diabetes ([place]) 07/16/2015  . Obesity hypoventilation syndrome ([place]) 04/20/2014  . Hyperlipidemia associated with type 2 diabetes mellitus ([place]) 07/31/2012  . Cushing disease ([place]) 07/31/2012  . Hypothyroid 07/31/2012    Past Surgical History:  Procedure Laterality Date  . ANTERIOR TALOFIBULAR LIGAMENT REPAIR Left 11/15/2014   Procedure: ANTERIOR TALOFIBULAR LIGAMENT REPAIR;  Surgeon: Jana Half, DPM;  Location: [facility];  Service: Podiatry;  Laterality: Left;  . CESAREAN SECTION  dec 1997/  06-03-2001/   01-01-2005   BILATERAL TUBAL LIGATION WITH LAST ONE  . DILATION AND CURETTAGE OF UTERUS  1995   WITH SUCTION  . DIRECT LARYNGOSCOPY N/A 12/18/2017   Procedure: DIRECT LARYNGOSCOPY;  Surgeon: Izora Gala, MD;  Location: [facility];   Service: ENT;  Laterality: N/A;  . ESOPHAGOGASTRODUODENOSCOPY (EGD) WITH PROPOFOL N/A 10/04/2016   Procedure: ESOPHAGOGASTRODUODENOSCOPY (EGD) WITH PROPOFOL;  Surgeon: [name], MD;  Location[facility];  Service: Endoscopy;  Laterality: N/A;  . LAPAROSCOPIC CHOLECYSTECTOMY  09-25-2005  . ORIF TIBIA PLATEAU Right 07/19/2015   Procedure: OPEN REDUCTION INTERNAL FIXATION (ORIF) RIGHT TIBIAL PLATEAU;  Surgeon: Altamese [place], MD;  Location: [facility];  Service: Orthopedics;  Laterality: Right;  . PARTIAL KNEE ARTHROPLASTY Right 04/19/2014   Procedure: RIGHT UNI KNEE ARTHROPLASTY MEDIALLY ;  Surgeon: Mauri Pole, MD;  Location: [facility];  Service: Orthopedics;  Laterality: Right;  . TRACHEOSTOMY TUBE PLACEMENT N/A 11/28/2017   Procedure: TRACHEOSTOMY;  Surgeon: Izora Gala, MD;  Location: [facility];  Service: ENT;  Laterality: N/A;  . TUBAL LIGATION       OB History   No obstetric history on file.      Home Medications    Prior to Admission medications   Medication Sig Start Date End Date Taking? Authorizing Provider  atorvastatin (LIPITOR) 40 MG tablet Take 40 mg by mouth at bedtime.     [provider]  azelastine (ASTELIN) 0.1 % nasal spray Place 2 sprays into both nostrils 2 (two) times daily. Use in each nostril as directed 09/25/18   Erick Colace, NP  buPROPion (WELLBUTRIN XL) 300 MG 24 hr tablet Take 300 mg by mouth daily.    [provider]  cholestyramine (QUESTRAN) 4 g packet Take 1 packet (4 g total) by mouth every other day. Patient not taking: Reported on 07/05/2018 01/08/18   Nita Sells, MD  dextromethorphan-guaiFENesin [facility] DM) 30-600 MG 12hr tablet Take 1 tablet by mouth daily.    [provider]  fluticasone (FLONASE) 50 MCG/ACT nasal spray Place 2 sprays into both nostrils daily. Patient taking differently: Place 1 spray into both nostrils daily.  04/14/18 04/14/19  Erick Colace, NP  gabapentin (NEURONTIN) 600 MG tablet Take 600 mg  by mouth 2 (two) times daily.     [provider]  hydrOXYzine (VISTARIL) 50 MG capsule Take 50 mg by mouth at bedtime.    [provider]  Insulin Detemir (LEVEMIR FLEXTOUCH) 100 UNIT/ML Pen Inject 30 Units into the skin 2 (two) times daily. 07/16/18   Nita Sells, MD  levothyroxine (SYNTHROID, LEVOTHROID) 125 MCG tablet Take 125 mcg by mouth daily before breakfast.    [provider]  lisinopril (PRINIVIL,ZESTRIL) 2.5 MG tablet Take 2.5 mg by mouth daily.    [provider]  metFORMIN (GLUCOPHAGE) 1000 MG tablet Take 1,000 mg by mouth 2 (two) times daily with a meal.    [provider]  metoprolol tartrate (LOPRESSOR) 25 MG tablet Take 0.5 tablets (12.5 mg total) by mouth 2 (two) times daily. Patient not taking: Reported on 07/14/2018 01/06/18   Nita Sells, MD  ondansetron (ZOFRAN) 4 MG tablet Take 4 mg by mouth every 8 (eight) hours as needed for nausea or vomiting.    [provider]  pantoprazole (PROTONIX) 40 MG tablet TAKE ONE TABLET BY MOUTH TWICE  A DAY BEFORE MEALS Patient taking differently: Take 40 mg by mouth daily.  04/01/17   [name], MD  traZODone (DESYREL) 100 MG tablet Take 50 mg by mouth at bedtime.     [provider]    Family History Family History  Adopted: Yes  Problem Relation Age of Onset  . Autism Son   . ADD / ADHD Son   . Apraxia Son     Social History Social History   Tobacco Use  . Smoking status: Former Smoker    Years: 4.00    Last attempt to quit: 05/22/2017    Years since quitting: 1.4  . Smokeless tobacco: Never Used  Substance Use Topics  . Alcohol use: Yes    Comment: RARE  . Drug use: No    Types: Marijuana     Allergies   Bactrim [sulfamethoxazole-trimethoprim] and Penicillins   Review of Systems Review of Systems  Constitutional: Negative for chills and fever.  HENT: Negative.        See HPI.  Respiratory: Positive for shortness of breath.  Negative for cough and wheezing.   Cardiovascular: Negative.  Negative for chest pain.  Gastrointestinal: Negative.  Negative for nausea.  Musculoskeletal: Negative.   Skin: Negative.   Neurological: Negative.      Physical Exam Updated Vital Signs SpO2 97%   Physical Exam Constitutional:      General: She is not in acute distress.    Appearance: She is well-developed.  HENT:     Head: Normocephalic.     Comments: Tracheostomy in place, site unremarkable. Audible tracheal stridor.    Mouth/Throat:     Mouth: Mucous membranes are moist.  Neck:     Musculoskeletal: Normal range of motion and neck supple.  Cardiovascular:     Rate and Rhythm: Normal rate and regular rhythm.  Pulmonary:     Effort: Pulmonary effort is normal.     Breath sounds: Normal breath sounds. No wheezing, rhonchi or rales.     Comments: Transmitted upper airway breath sounds. Abdominal:     General: Bowel sounds are normal.     Palpations: Abdomen is soft.     Tenderness: There is no abdominal tenderness. There is no guarding or rebound.  Musculoskeletal: Normal range of motion.  Skin:    General: Skin is warm and dry.     Findings: No rash.  Neurological:     General: No focal deficit present.     Mental Status: She is alert and oriented to person, place, and time.      ED Treatments / Results  Labs (all labs ordered are listed, but only abnormal results are displayed) Labs Reviewed - No data to display  EKG None  Radiology No results found.  Procedures Procedures (including critical care time)  Medications Ordered in ED Medications - No data to display   Initial Impression / Assessment and Plan / ED Course  I have reviewed the triage vital signs and the nursing notes.  Pertinent labs & imaging results that were available during my care of the patient were reviewed by me and considered in my medical decision making (see chart for details).     Tracheostomy patient with  congestion within the trach device. No URI, fever.  11:20: RT at bedside.  Trach suctioned without relief of breathing difficulty. Per RT, it is recommended that the device be exchanged, which is done with Dr. Leonides Schanz at bedside without complication. There is a significant mucus plug  noted in removed device. Post-exchange CXR clear.   She is felt stable for discharge home.   Final Clinical Impressions(s) / ED Diagnoses   Final diagnoses:  None   1. Tracheostomy care  ED Discharge Orders    None       Charlann Lange, PA-C 10/20/18 0059    Ward, Delice Bison, DO 10/20/18 1761

## 2018-10-19 NOTE — ED Triage Notes (Signed)
Pt came in to ED due to inner cannula of trach "crumpling up" when she tried to replace it. Stated that her home suction was not able to remove a plug.

## 2018-10-20 ENCOUNTER — Other Ambulatory Visit: Payer: Self-pay

## 2018-10-20 ENCOUNTER — Emergency Department (HOSPITAL_COMMUNITY): Payer: Medicaid Other

## 2018-10-20 NOTE — Discharge Instructions (Addendum)
Return to the emergency department with any new or concerning symptoms.

## 2018-10-20 NOTE — ED Notes (Signed)
Pt given discharge instructions and teach back was displayed.

## 2018-10-20 NOTE — Procedures (Signed)
Tracheostomy Change Note  Patient Details:   Name: [name] DOB: Sep 21, 1975 MRN: [id number]    Airway Documentation:     Evaluation  O2 sats: stable throughout Complications: No apparent complications Patient did tolerate procedure well. Bilateral Breath Sounds: Clear   Pt trach occluded with hard green mucus plug on arrival to ED. This RT and Charge RT with presence of MD and RN changed out pt trach and trach collar without complication of difficulty.  RT will continue to monitor.   Roby Lofts Hollye Pritt 10/20/2018, 12:31 AM

## 2018-10-22 NOTE — Progress Notes (Signed)
[facility] [employer] PRE[facility] Weekly Session   Patient Details  Name: [name] MRN: [id number] Date of Birth: Jun 23, 1976 Age: 43 y.o. PCP: [facility]:   10/22/18 1708  Weight: 282 lb (127.9 kg)    [facility] Weekly seesion - 10/22/18 1700      Weekly Session   Topic Discussed  Healthy eating tips        Vanita Ingles 10/22/2018, 5:09 PM

## 2018-10-23 ENCOUNTER — Ambulatory Visit (HOSPITAL_COMMUNITY): Payer: Self-pay

## 2018-10-29 NOTE — Progress Notes (Signed)
[facility] [employer] PRE[facility] Weekly Session   Patient Details  Name: [name] MRN: [id number] Date of Birth: 1976-02-04 Age: 43 y.o. PCP: [place]  There were no vitals filed for this visit.  [facility] [employer] Weekly seesion - 10/29/18 2000      Weekly Session   Topic Discussed  Health habits        Vanita Ingles 10/29/2018, 8:22 PM

## 2018-11-13 ENCOUNTER — Ambulatory Visit (HOSPITAL_COMMUNITY): Payer: Self-pay

## 2018-11-20 ENCOUNTER — Emergency Department (HOSPITAL_COMMUNITY)
Admission: EM | Admit: 2018-11-20 | Discharge: 2018-11-20 | Disposition: A | Discharge status: Home / Self Care | Payer: Medicaid Other | Attending: Emergency Medicine | Admitting: Emergency Medicine

## 2018-11-20 DIAGNOSIS — Z87891 Personal history of nicotine dependence: Secondary | ICD-10-CM | POA: Insufficient documentation

## 2018-11-20 DIAGNOSIS — J9503 Malfunction of tracheostomy stoma: Secondary | ICD-10-CM | POA: Diagnosis not present

## 2018-11-20 DIAGNOSIS — Z79899 Other long term (current) drug therapy: Secondary | ICD-10-CM | POA: Diagnosis not present

## 2018-11-20 DIAGNOSIS — J9509 Other tracheostomy complication: Secondary | ICD-10-CM

## 2018-11-20 NOTE — Progress Notes (Signed)
Called by ED RT to assist w/ trach change.  Confirmed w/ Marni Griffon NP that it's ok for RT to change trach (hx of difficult airway).  Pt trach changed w/ same brand trach/same size #6 XLT Proximal w/ no noted difficulty, no noted complications.  + easy cap color change (purple to yellow), sat 100% t/o trach change.  Pt states she feels she is breathing well w/ new trach.  VSS.  Called trach clinic to inform that trach has been changed today.

## 2018-11-20 NOTE — ED Triage Notes (Signed)
See downtime paperwork.

## 2018-11-20 NOTE — Discharge Instructions (Addendum)
You were seen in the emergency department for a clogged tracheostomy and had improvement in your symptoms with a trach change.  Please follow-up with the trach clinic and return if any worsening symptoms.

## 2018-11-20 NOTE — ED Notes (Signed)
Got patient on the monitor patient is resting with call bell in reach  ?

## 2018-11-20 NOTE — ED Provider Notes (Signed)
[facility] EMERGENCY DEPARTMENT Provider Note   CSN: [id number] Arrival date & time: 11/20/18  0957    History   Chief Complaint Chief Complaint  Patient presents with  . Shortness of Breath    HPI [name] is a 43 y.o. female.  She is brought in by her husband for evaluation of her trach feeling clogged.  She is a longstanding trach and she last got it changed in January.  He missed another appointment and she does not have another appointment for change until at the end of this month.  She has had some chest congestion.  That usually improves with some suctioning but she has been unable to get the catheter down.  Denies any fever.  No chest pain.     The history is provided by the patient and the spouse.  Shortness of Breath  Severity:  Severe Onset quality:  Gradual Duration:  1 day Timing:  Constant Progression:  Unchanged Chronicity:  Recurrent Relieved by:  Nothing Worsened by:  Activity, deep breathing and exertion Ineffective treatments: suctioning. Associated symptoms: cough   Associated symptoms: no abdominal pain, no chest pain, no fever, no headaches, no hemoptysis, no neck pain, no rash, no sore throat, no syncope and no vomiting     No past medical history on file.  There are no active problems to display for this patient.   Past Surgical History:  Procedure Laterality Date  . ANTERIOR TALOFIBULAR LIGAMENT REPAIR Left 11/15/2014   Procedure: ANTERIOR TALOFIBULAR LIGAMENT REPAIR;  Surgeon: Jana Half, DPM;  Location: [facility];  Service: Podiatry;  Laterality: Left;  . CESAREAN SECTION  dec 1997/  06-03-2001/   01-01-2005   BILATERAL TUBAL LIGATION WITH LAST ONE  . DILATION AND CURETTAGE OF UTERUS  1995   WITH SUCTION  . DIRECT LARYNGOSCOPY N/A 12/18/2017   Procedure: DIRECT LARYNGOSCOPY;  Surgeon: Izora Gala, MD;  Location: [facility];  Service: ENT;  Laterality: N/A;  . ESOPHAGOGASTRODUODENOSCOPY (EGD) WITH  PROPOFOL N/A 10/04/2016   Procedure: ESOPHAGOGASTRODUODENOSCOPY (EGD) WITH PROPOFOL;  Surgeon: [name], MD;  [facility];  Service: Endoscopy;  Laterality: N/A;  . LAPAROSCOPIC CHOLECYSTECTOMY  09-25-2005  . ORIF TIBIA PLATEAU Right 07/19/2015   Procedure: OPEN REDUCTION INTERNAL FIXATION (ORIF) RIGHT TIBIAL PLATEAU;  Surgeon: Altamese [place], MD;  Location: [facility];  Service: Orthopedics;  Laterality: Right;  . PARTIAL KNEE ARTHROPLASTY Right 04/19/2014   Procedure: RIGHT UNI KNEE ARTHROPLASTY MEDIALLY ;  Surgeon: Mauri Pole, MD;  [facility];  Service: Orthopedics;  Laterality: Right;  . TRACHEOSTOMY TUBE PLACEMENT N/A 11/28/2017   Procedure: TRACHEOSTOMY;  Surgeon: Izora Gala, MD;  Location: [facility];  Service: ENT;  Laterality: N/A;  . TUBAL LIGATION       OB History   No obstetric history on file.      Home Medications    Prior to Admission medications   Medication Sig Start Date End Date Taking? Authorizing Provider  atorvastatin (LIPITOR) 40 MG tablet Take 40 mg by mouth at bedtime.     [provider]  azelastine (ASTELIN) 0.1 % nasal spray Place 2 sprays into both nostrils 2 (two) times daily. Use in each nostril as directed 09/25/18   Erick Colace, NP  buPROPion (WELLBUTRIN XL) 300 MG 24 hr tablet Take 300 mg by mouth daily.    [provider]  cholestyramine (QUESTRAN) 4 g packet Take 1 packet (4 g total) by mouth every other day.  Patient not taking: Reported on 07/05/2018 01/08/18   Nita Sells, MD  dextromethorphan-guaiFENesin [facility] DM) 30-600 MG 12hr tablet Take 1 tablet by mouth daily.    [provider]  fluticasone (FLONASE) 50 MCG/ACT nasal spray Place 2 sprays into both nostrils daily. Patient taking differently: Place 1 spray into both nostrils daily.  04/14/18 04/14/19  Erick Colace, NP  gabapentin (NEURONTIN) 600 MG tablet Take 600 mg by mouth 2 (two) times daily.     [provider]  hydrOXYzine  (VISTARIL) 50 MG capsule Take 50 mg by mouth at bedtime.    [provider]  Insulin Detemir (LEVEMIR FLEXTOUCH) 100 UNIT/ML Pen Inject 30 Units into the skin 2 (two) times daily. 07/16/18   Nita Sells, MD  levothyroxine (SYNTHROID, LEVOTHROID) 125 MCG tablet Take 125 mcg by mouth daily before breakfast.    [provider]  lisinopril (PRINIVIL,ZESTRIL) 2.5 MG tablet Take 2.5 mg by mouth daily.    [provider]  metFORMIN (GLUCOPHAGE) 1000 MG tablet Take 1,000 mg by mouth 2 (two) times daily with a meal.    [provider]  metoprolol tartrate (LOPRESSOR) 25 MG tablet Take 0.5 tablets (12.5 mg total) by mouth 2 (two) times daily. Patient not taking: Reported on 07/14/2018 01/06/18   Nita Sells, MD  ondansetron (ZOFRAN) 4 MG tablet Take 4 mg by mouth every 8 (eight) hours as needed for nausea or vomiting.    [provider]  pantoprazole (PROTONIX) 40 MG tablet TAKE ONE TABLET BY MOUTH TWICE A DAY BEFORE MEALS Patient taking differently: Take 40 mg by mouth daily.  04/01/17   [name], MD  traZODone (DESYREL) 100 MG tablet Take 50 mg by mouth at bedtime.     [provider]    Family History No family history on file.  Social History Social History   Tobacco Use  . Smoking status: Former Smoker    Years: 4.00    Last attempt to quit: 05/22/2017    Years since quitting: 1.5  . Smokeless tobacco: Never Used  Substance Use Topics  . Alcohol use: Yes    Comment: RARE  . Drug use: No    Types: Marijuana     Allergies   Patient has no allergy information on record.   Review of Systems Review of Systems  Constitutional: Negative for fever.  HENT: Negative for sore throat.   Eyes: Negative for visual disturbance.  Respiratory: Positive for cough and shortness of breath. Negative for hemoptysis.   Cardiovascular: Negative for chest pain and syncope.  Gastrointestinal: Negative for abdominal pain and  vomiting.  Genitourinary: Negative for dysuria.  Musculoskeletal: Negative for neck pain.  Skin: Negative for rash.  Neurological: Negative for headaches.     Physical Exam Updated Vital Signs BP 91/75 Comment: pt states this is normal for her  Pulse (!) 107   Resp 20   SpO2 98%   Physical Exam Vitals signs and nursing note reviewed.  Constitutional:      General: She is not in acute distress.    Appearance: She is well-developed. She is obese.  HENT:     Head: Normocephalic and atraumatic.  Eyes:     Conjunctiva/sclera: Conjunctivae normal.  Neck:     Comments: She is a trach in place with some audibly noisy breathing..  There is no surrounding erythema.  No crepitus in the neck. Cardiovascular:     Rate and Rhythm: Normal rate and regular rhythm.  Heart sounds: No murmur.  Pulmonary:     Effort: Pulmonary effort is normal. No respiratory distress.     Breath sounds: Normal breath sounds.  Abdominal:     Palpations: Abdomen is soft.     Tenderness: There is no abdominal tenderness.  Musculoskeletal:     Right lower leg: She exhibits no tenderness.     Left lower leg: She exhibits no tenderness.  Skin:    General: Skin is warm and dry.     Capillary Refill: Capillary refill takes less than 2 seconds.  Neurological:     General: No focal deficit present.     Mental Status: She is alert and oriented to person, place, and time.      ED Treatments / Results  Labs (all labs ordered are listed, but only abnormal results are displayed) Labs Reviewed - No data to display  EKG None  Radiology No results found.  Procedures Procedures (including critical care time)  Medications Ordered in ED Medications - No data to display   Initial Impression / Assessment and Plan / ED Course  I have reviewed the triage vital signs and the nursing notes.  Pertinent labs & imaging results that were available during my care of the patient were reviewed by me and considered  in my medical decision making (see chart for details).  Clinical Course as of Nov 20 1545  Thu Nov 20, 2018  1233 Patient underwent a trach change by respiratory therapy and has improved symptoms.  She would like to be discharged.   [MB]    Clinical Course User Index [MB] Hayden Rasmussen, MD      ddx - airway obstriuction, tracheostomy misplacement   Final Clinical Impressions(s) / ED Diagnoses   Final diagnoses:  Tracheostomy obstruction [facility])    ED Discharge Orders    None       Hayden Rasmussen, MD 11/21/18 1549

## 2018-11-21 ENCOUNTER — Encounter (HOSPITAL_COMMUNITY): Payer: Self-pay | Admitting: Emergency Medicine

## 2018-12-01 ENCOUNTER — Encounter (HOSPITAL_COMMUNITY): Payer: Self-pay

## 2018-12-01 ENCOUNTER — Emergency Department (HOSPITAL_COMMUNITY)
Admission: EM | Admit: 2018-12-01 | Discharge: 2018-12-01 | Disposition: A | Discharge status: Home / Self Care | Payer: Medicaid Other | Attending: Emergency Medicine | Admitting: Emergency Medicine

## 2018-12-01 ENCOUNTER — Other Ambulatory Visit: Payer: Self-pay

## 2018-12-01 DIAGNOSIS — Z79899 Other long term (current) drug therapy: Secondary | ICD-10-CM | POA: Insufficient documentation

## 2018-12-01 DIAGNOSIS — I10 Essential (primary) hypertension: Secondary | ICD-10-CM | POA: Diagnosis not present

## 2018-12-01 DIAGNOSIS — Z7984 Long term (current) use of oral hypoglycemic drugs: Secondary | ICD-10-CM | POA: Diagnosis not present

## 2018-12-01 DIAGNOSIS — E119 Type 2 diabetes mellitus without complications: Secondary | ICD-10-CM | POA: Insufficient documentation

## 2018-12-01 DIAGNOSIS — Z87891 Personal history of nicotine dependence: Secondary | ICD-10-CM | POA: Insufficient documentation

## 2018-12-01 DIAGNOSIS — Z430 Encounter for attention to tracheostomy: Secondary | ICD-10-CM | POA: Insufficient documentation

## 2018-12-01 DIAGNOSIS — E039 Hypothyroidism, unspecified: Secondary | ICD-10-CM | POA: Insufficient documentation

## 2018-12-01 NOTE — ED Triage Notes (Signed)
Pt arrives stating trach is backed up since last night. Pt normally self suctions at home,but reports she can't get suction tube down. Pt endorses SOB with exertion. Pt alert and oriented.

## 2018-12-01 NOTE — Discharge Instructions (Addendum)
Thank you for allowing me to care for you today. Please return to the emergency department if you have new or worsening symptoms. Take your medications as instructed.  ° °

## 2018-12-01 NOTE — Progress Notes (Signed)
Rt called to assess patient. When I arrived, patient stable and able to talk. Stated that her trach is clogged and  Needed it changed. Attempted to suctioned, can't pass cath. Spoke with Marni Griffon, who gave me the ok to change trach. Easy change, trach secured per policy.

## 2018-12-01 NOTE — ED Notes (Signed)
Respiratory at bedside.

## 2018-12-01 NOTE — ED Provider Notes (Signed)
[facility] EMERGENCY DEPARTMENT Provider Note   CSN: [id number] Arrival date & time: 12/01/18  1211    History   Chief Complaint Chief Complaint  Patient presents with  . Trach Problem    HPI [name] is a 43 y.o. female.     Patient is a 43 year old female with past medical history of anxiety, diabetes, hypertension, morbid obesity with tracheotomy in place for the past 1 year.  She comes to the emergency department for evaluation of her trach feeling like it is clogged up.  Reports that this happens every so often.  She usually sees the trach clinic outpatient but reports that they have been closed recently.  Reports that she has come here to the emergency department and has had it suctioned out or changed with good relief.  Reports that this is what she is wishing to have done today.  She denies any fevers, shortness of breath, chest pain.     Past Medical History:  Diagnosis Date  . Anxiety   . Complication of anesthesia    Pt. states takes long time to wake up from it.   . Cushing's disease ([place])   . Depression   . Diabetes ([place])   . Hyperlipidemia   . Hyperlipidemia   . Hypertension   . Morbid obesity ([place])   . Osteoporosis 07/19/2015  . Periprosthetic fracture 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[place]), R tibial plateau  07/18/2015  . Sleep apnea   . Uncontrolled diabetes mellitus with diabetic neuropathy, with long-term current use of insulin ([place]) 07/16/2015  . Vitamin D deficiency 07/19/2015    Patient Active Problem List   Diagnosis Date Noted  . Pyelonephritis 07/16/2018  . UTI (urinary tract infection) 07/14/2018  . Sepsis secondary to UTI ([place]) 07/05/2018  . AKI (acute kidney injury) ([place]) 07/05/2018  . Allergic reaction caused by a drug 07/05/2018  . Tracheostomy complication ([place])   . Tracheostomy dependence ([place])   . OSA (obstructive sleep apnea)   . Depression   . Acute respiratory failure with hypoxemia ([place])   . SOB  (shortness of breath)   . Tracheostomy status ([place])   . Hoarse voice quality   . Hypomagnesemia   . Shortness of breath   . Major depressive disorder, recurrent episode, severe ([place])   . Intentional overdose of drug in tablet form ([place]) 11/20/2017  . Intentional drug overdose ([place])   . Seizure ([place])   . Acute respiratory failure with hypercapnia ([place])   . Status epilepticus ([place])   . Spondylosis without myelopathy or radiculopathy, cervical region 10/29/2017  . Abdominal pain, epigastric   . LUQ abdominal pain   . Nausea and vomiting   . Gastroparesis   . Hypertriglyceridemia 07/22/2015  . Diabetes mellitus due to underlying condition, uncontrolled, with diabetic neuropathy, with long-term current use of insulin ([place])   . Vitamin D deficiency 07/19/2015  . 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 Osteoporosis 07/19/2015  . Periprosthetic fracture 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[place]), R tibial plateau  07/18/2015  . Fracture, tibial plateau 07/16/2015  . Uncontrolled diabetes mellitus with diabetic neuropathy, with long-term current use of insulin ([place]) 07/16/2015  . Leukocytosis 07/16/2015  . Hypertension associated with diabetes ([place]) 07/16/2015  . Obesity hypoventilation syndrome ([place]) 04/20/2014  . Hyperlipidemia associated with type 2 diabetes mellitus ([place]) 07/31/2012  . Cushing disease ([place]) 07/31/2012  . Hypothyroid 07/31/2012    Past Surgical History:  Procedure Laterality Date  .  ANTERIOR TALOFIBULAR LIGAMENT REPAIR Left 11/15/2014   Procedure: ANTERIOR TALOFIBULAR LIGAMENT REPAIR;  Surgeon: Jana Half, DPM;  Location: [facility];  Service: Podiatry;  Laterality: Left;  . CESAREAN SECTION  dec 1997/  06-03-2001/   01-01-2005   BILATERAL TUBAL LIGATION WITH LAST ONE  . DILATION AND CURETTAGE OF UTERUS  1995   WITH SUCTION  . DIRECT LARYNGOSCOPY N/A 12/18/2017   Procedure: DIRECT LARYNGOSCOPY;  Surgeon:  Izora Gala, MD;  Location: [facility];  Service: ENT;  Laterality: N/A;  . ESOPHAGOGASTRODUODENOSCOPY (EGD) WITH PROPOFOL N/A 10/04/2016   Procedure: ESOPHAGOGASTRODUODENOSCOPY (EGD) WITH PROPOFOL;  Surgeon: [name], MD;  [facility];  Service: Endoscopy;  Laterality: N/A;  . LAPAROSCOPIC CHOLECYSTECTOMY  09-25-2005  . ORIF TIBIA PLATEAU Right 07/19/2015   Procedure: OPEN REDUCTION INTERNAL FIXATION (ORIF) RIGHT TIBIAL PLATEAU;  Surgeon: Altamese Catherine, MD;  Location: [facility];  Service: Orthopedics;  Laterality: Right;  . PARTIAL KNEE ARTHROPLASTY Right 04/19/2014   Procedure: RIGHT UNI KNEE ARTHROPLASTY MEDIALLY ;  Surgeon: Mauri Pole, MD;  [facility];  Service: Orthopedics;  Laterality: Right;  . TRACHEOSTOMY TUBE PLACEMENT N/A 11/28/2017   Procedure: TRACHEOSTOMY;  Surgeon: Izora Gala, MD;  Location: [facility];  Service: ENT;  Laterality: N/A;  . TUBAL LIGATION       OB History   No obstetric history on file.      Home Medications    Prior to Admission medications   Medication Sig Start Date End Date Taking? Authorizing Provider  atorvastatin (LIPITOR) 40 MG tablet Take 40 mg by mouth at bedtime.     [provider]  azelastine (ASTELIN) 0.1 % nasal spray Place 2 sprays into both nostrils 2 (two) times daily. Use in each nostril as directed 09/25/18   Erick Colace, NP  buPROPion (WELLBUTRIN XL) 300 MG 24 hr tablet Take 300 mg by mouth daily.    [provider]  cholestyramine (QUESTRAN) 4 g packet Take 1 packet (4 g total) by mouth every other day. Patient not taking: Reported on 07/05/2018 01/08/18   Nita Sells, MD  dextromethorphan-guaiFENesin [facility] DM) 30-600 MG 12hr tablet Take 1 tablet by mouth daily.    [provider]  fluticasone (FLONASE) 50 MCG/ACT nasal spray Place 2 sprays into both nostrils daily. Patient taking differently: Place 1 spray into both nostrils daily.  04/14/18 04/14/19  Erick Colace, NP  gabapentin  (NEURONTIN) 600 MG tablet Take 600 mg by mouth 2 (two) times daily.     [provider]  hydrOXYzine (VISTARIL) 50 MG capsule Take 50 mg by mouth at bedtime.    [provider]  Insulin Detemir (LEVEMIR FLEXTOUCH) 100 UNIT/ML Pen Inject 30 Units into the skin 2 (two) times daily. 07/16/18   Nita Sells, MD  levothyroxine (SYNTHROID, LEVOTHROID) 125 MCG tablet Take 125 mcg by mouth daily before breakfast.    [provider]  lisinopril (PRINIVIL,ZESTRIL) 2.5 MG tablet Take 2.5 mg by mouth daily.    [provider]  metFORMIN (GLUCOPHAGE) 1000 MG tablet Take 1,000 mg by mouth 2 (two) times daily with a meal.    [provider]  metoprolol tartrate (LOPRESSOR) 25 MG tablet Take 0.5 tablets (12.5 mg total) by mouth 2 (two) times daily. Patient not taking: Reported on 07/14/2018 01/06/18   Nita Sells, MD  ondansetron (ZOFRAN) 4 MG tablet Take 4 mg by mouth every 8 (eight) hours as needed for nausea or vomiting.    [provider]  pantoprazole (PROTONIX) 40 MG tablet TAKE ONE TABLET BY MOUTH TWICE A DAY BEFORE MEALS Patient taking differently: Take 40 mg by mouth daily.  04/01/17   [name], MD  traZODone (DESYREL) 100 MG tablet Take 50 mg by mouth at bedtime.     [provider]    Family History Family History  Adopted: Yes  Problem Relation Age of Onset  . Autism Son   . ADD / ADHD Son   . Apraxia Son     Social History Social History   Tobacco Use  . Smoking status: Former Smoker    Years: 4.00    Last attempt to quit: 05/22/2017    Years since quitting: 1.5  . Smokeless tobacco: Never Used  Substance Use Topics  . Alcohol use: Yes    Comment: RARE  . Drug use: No    Types: Marijuana     Allergies   Bactrim [sulfamethoxazole-trimethoprim] and Penicillins   Review of Systems Review of Systems  Constitutional: Negative for chills and fever.  HENT: Positive for sore throat. Negative for ear  pain.   Eyes: Negative for pain and visual disturbance.  Respiratory: Negative for cough and shortness of breath.   Cardiovascular: Negative for chest pain and palpitations.  Gastrointestinal: Negative for abdominal pain and vomiting.  Genitourinary: Negative for dysuria and hematuria.  Musculoskeletal: Negative for arthralgias and back pain.  Skin: Negative for color change and rash.  Neurological: Negative for seizures and syncope.  All other systems reviewed and are negative.    Physical Exam Updated Vital Signs BP 125/87   Pulse (!) 104   Temp 97.9 F (36.6 C) (Oral)   Resp 18   SpO2 98%   Physical Exam Vitals signs and nursing note reviewed. Exam conducted with a chaperone present.  Constitutional:      Appearance: Normal appearance. She is obese.  HENT:     Head: Normocephalic.     Mouth/Throat:     Mouth: Mucous membranes are moist.     Pharynx: Oropharynx is clear.  Eyes:     Conjunctiva/sclera: Conjunctivae normal.  Cardiovascular:     Comments: Trach in place. Patient does have congested sounding trach. Patient speaking in mostly full sentences. Pulmonary:     Effort: Pulmonary effort is normal.  Skin:    General: Skin is dry.  Neurological:     General: No focal deficit present.     Mental Status: She is alert.  Psychiatric:        Mood and Affect: Mood normal.      ED Treatments / Results  Labs (all labs ordered are listed, but only abnormal results are displayed) Labs Reviewed - No data to display  EKG None  Radiology No results found.  Procedures Procedures (including critical care time)  Medications Ordered in ED Medications - No data to display   Initial Impression / Assessment and Plan / ED Course  I have reviewed the triage vital signs and the nursing notes.  Pertinent labs & imaging results that were available during my care of the patient were reviewed by me and considered in my medical decision making (see chart for details).   Clinical Course as of Nov 30 1299  Mon Dec 01, 2018  1224 Per patient request, respiratory was called to attempt trach suction   [KM]  1259 Patient had trach changed by respiratory and could like to be discharged.    [KM]    Clinical Course User  Index [KM] Alveria Apley, PA-C       Thank you for allowing me to care for you today. Please return to the emergency department if you have new or worsening symptoms. Take your medications as instructed.    Final Clinical Impressions(s) / ED Diagnoses   Final diagnoses:  Tracheostomy care [facility])    ED Discharge Orders    None       Kristine Royal 12/01/18 1301    Valarie Merino, MD 12/23/18 1455

## 2018-12-04 ENCOUNTER — Inpatient Hospital Stay (HOSPITAL_COMMUNITY): Admission: RE | Admit: 2018-12-04 | Payer: Self-pay | Source: Ambulatory Visit

## 2019-02-06 ENCOUNTER — Encounter (HOSPITAL_COMMUNITY): Payer: Self-pay | Admitting: Emergency Medicine

## 2019-02-06 ENCOUNTER — Emergency Department (HOSPITAL_COMMUNITY)
Admission: EM | Admit: 2019-02-06 | Discharge: 2019-02-06 | Disposition: A | Discharge status: Home / Self Care | Payer: Medicaid Other | Attending: Emergency Medicine | Admitting: Emergency Medicine

## 2019-02-06 DIAGNOSIS — Z430 Encounter for attention to tracheostomy: Secondary | ICD-10-CM | POA: Diagnosis present

## 2019-02-06 DIAGNOSIS — Z7984 Long term (current) use of oral hypoglycemic drugs: Secondary | ICD-10-CM | POA: Diagnosis not present

## 2019-02-06 DIAGNOSIS — Z79899 Other long term (current) drug therapy: Secondary | ICD-10-CM | POA: Insufficient documentation

## 2019-02-06 DIAGNOSIS — I10 Essential (primary) hypertension: Secondary | ICD-10-CM | POA: Diagnosis not present

## 2019-02-06 DIAGNOSIS — Z87891 Personal history of nicotine dependence: Secondary | ICD-10-CM | POA: Insufficient documentation

## 2019-02-06 DIAGNOSIS — E039 Hypothyroidism, unspecified: Secondary | ICD-10-CM | POA: Insufficient documentation

## 2019-02-06 DIAGNOSIS — E119 Type 2 diabetes mellitus without complications: Secondary | ICD-10-CM | POA: Diagnosis not present

## 2019-02-06 NOTE — Progress Notes (Addendum)
[name] was change due to plug at the end #6 Shiley XLT CFL Proximal was taken out and a new #6 Shiley XLT CFL Proximal was put into place with no complications from patient. Patient is sitting up right with no distress.  Positive color change and equal breath sounds throughout

## 2019-02-06 NOTE — ED Provider Notes (Signed)
[facility] EMERGENCY DEPARTMENT Provider Note   CSN: [id number] Arrival date & time: 02/06/19  1913    History   Chief Complaint Chief Complaint  Patient presents with  . Tracheostomy Tube Change    HPI [name] is a 43 y.o. female the past medical history of morbid obesity, sleep apnea, Cushing's disease presents with request for tracheostomy change.  Patient states that her trach has come back up she is having difficulty suctioning it out.  States she supposed to get it changed every 3 weeks. However, due to COVID the office has been closed and she has been unable to get it changed out for the past 2 months.  She only uses trach collar at night. No other issues or complaints.  No fevers cough congestion, chest pain, shortness of breath.     HPI  Past Medical History:  Diagnosis Date  . Anxiety   . Complication of anesthesia    Pt. states takes long time to wake up from it.   . Cushing's disease ([place])   . Depression   . Diabetes ([place])   . Hyperlipidemia   . Hyperlipidemia   . Hypertension   . Morbid obesity ([place])   . Osteoporosis 07/19/2015  . Periprosthetic fracture 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[place]), R tibial plateau  07/18/2015  . Sleep apnea   . Uncontrolled diabetes mellitus with diabetic neuropathy, with long-term current use of insulin ([place]) 07/16/2015  . Vitamin D deficiency 07/19/2015    Patient Active Problem List   Diagnosis Date Noted  . Pyelonephritis 07/16/2018  . UTI (urinary tract infection) 07/14/2018  . Sepsis secondary to UTI ([place]) 07/05/2018  . AKI (acute kidney injury) ([place]) 07/05/2018  . Allergic reaction caused by a drug 07/05/2018  . Tracheostomy complication ([place])   . Tracheostomy dependence ([place])   . OSA (obstructive sleep apnea)   . Depression   . Acute respiratory failure with hypoxemia ([place])   . SOB (shortness of breath)   . Tracheostomy status ([place])   . Hoarse voice quality   . Hypomagnesemia   .  Shortness of breath   . Major depressive disorder, recurrent episode, severe ([place])   . Intentional overdose of drug in tablet form ([place]) 11/20/2017  . Intentional drug overdose ([place])   . Seizure ([place])   . Acute respiratory failure with hypercapnia ([place])   . Status epilepticus ([place])   . Spondylosis without myelopathy or radiculopathy, cervical region 10/29/2017  . Abdominal pain, epigastric   . LUQ abdominal pain   . Nausea and vomiting   . Gastroparesis   . Hypertriglyceridemia 07/22/2015  . Diabetes mellitus due to underlying condition, uncontrolled, with diabetic neuropathy, with long-term current use of insulin ([place])   . Vitamin D deficiency 07/19/2015  . 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 Osteoporosis 07/19/2015  . Periprosthetic fracture 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[place]), R tibial plateau  07/18/2015  . Fracture, tibial plateau 07/16/2015  . Uncontrolled diabetes mellitus with diabetic neuropathy, with long-term current use of insulin ([place]) 07/16/2015  . Leukocytosis 07/16/2015  . Hypertension associated with diabetes ([place]) 07/16/2015  . Obesity hypoventilation syndrome ([place]) 04/20/2014  . Hyperlipidemia associated with type 2 diabetes mellitus ([place]) 07/31/2012  . Cushing disease ([place]) 07/31/2012  . Hypothyroid 07/31/2012    Past Surgical History:  Procedure Laterality Date  . ANTERIOR TALOFIBULAR LIGAMENT REPAIR Left 11/15/2014   Procedure: ANTERIOR TALOFIBULAR LIGAMENT REPAIR;  Surgeon: Jana Half, DPM;  Location: [facility];  Service: Podiatry;  Laterality: Left;  . CESAREAN SECTION  dec 1997/  06-03-2001/   01-01-2005   BILATERAL TUBAL LIGATION WITH LAST ONE  . DILATION AND CURETTAGE OF UTERUS  1995   WITH SUCTION  . DIRECT LARYNGOSCOPY N/A 12/18/2017   Procedure: DIRECT LARYNGOSCOPY;  Surgeon: Izora Gala, MD;  Location: [facility];  Service: ENT;  Laterality: N/A;  . ESOPHAGOGASTRODUODENOSCOPY (EGD)  WITH PROPOFOL N/A 10/04/2016   Procedure: ESOPHAGOGASTRODUODENOSCOPY (EGD) WITH PROPOFOL;  Surgeon: [name], MD;  [facility];  Service: Endoscopy;  Laterality: N/A;  . LAPAROSCOPIC CHOLECYSTECTOMY  09-25-2005  . ORIF TIBIA PLATEAU Right 07/19/2015   Procedure: OPEN REDUCTION INTERNAL FIXATION (ORIF) RIGHT TIBIAL PLATEAU;  Surgeon: Altamese  Hills, MD;  Location: [facility];  Service: Orthopedics;  Laterality: Right;  . PARTIAL KNEE ARTHROPLASTY Right 04/19/2014   Procedure: RIGHT UNI KNEE ARTHROPLASTY MEDIALLY ;  Surgeon: Mauri Pole, MD;  [facility];  Service: Orthopedics;  Laterality: Right;  . TRACHEOSTOMY TUBE PLACEMENT N/A 11/28/2017   Procedure: TRACHEOSTOMY;  Surgeon: Izora Gala, MD;  Location: [facility];  Service: ENT;  Laterality: N/A;  . TUBAL LIGATION       OB History   No obstetric history on file.      Home Medications    Prior to Admission medications   Medication Sig Start Date End Date Taking? Authorizing Provider  atorvastatin (LIPITOR) 40 MG tablet Take 40 mg by mouth at bedtime.     [provider]  azelastine (ASTELIN) 0.1 % nasal spray Place 2 sprays into both nostrils 2 (two) times daily. Use in each nostril as directed 09/25/18   Erick Colace, NP  buPROPion (WELLBUTRIN XL) 300 MG 24 hr tablet Take 300 mg by mouth daily.    [provider]  cholestyramine (QUESTRAN) 4 g packet Take 1 packet (4 g total) by mouth every other day. Patient not taking: Reported on 07/05/2018 01/08/18   Nita Sells, MD  dextromethorphan-guaiFENesin [facility] DM) 30-600 MG 12hr tablet Take 1 tablet by mouth daily.    [provider]  fluticasone (FLONASE) 50 MCG/ACT nasal spray Place 2 sprays into both nostrils daily. Patient taking differently: Place 1 spray into both nostrils daily.  04/14/18 04/14/19  Erick Colace, NP  gabapentin (NEURONTIN) 600 MG tablet Take 600 mg by mouth 2 (two) times daily.     [provider]   hydrOXYzine (VISTARIL) 50 MG capsule Take 50 mg by mouth at bedtime.    [provider]  Insulin Detemir (LEVEMIR FLEXTOUCH) 100 UNIT/ML Pen Inject 30 Units into the skin 2 (two) times daily. 07/16/18   Nita Sells, MD  levothyroxine (SYNTHROID, LEVOTHROID) 125 MCG tablet Take 125 mcg by mouth daily before breakfast.    [provider]  lisinopril (PRINIVIL,ZESTRIL) 2.5 MG tablet Take 2.5 mg by mouth daily.    [provider]  metFORMIN (GLUCOPHAGE) 1000 MG tablet Take 1,000 mg by mouth 2 (two) times daily with a meal.    [provider]  metoprolol tartrate (LOPRESSOR) 25 MG tablet Take 0.5 tablets (12.5 mg total) by mouth 2 (two) times daily. Patient not taking: Reported on 07/14/2018 01/06/18   Nita Sells, MD  ondansetron (ZOFRAN) 4 MG tablet Take 4 mg by mouth every 8 (eight) hours as needed for nausea or vomiting.    [provider]  pantoprazole (PROTONIX) 40 MG tablet TAKE ONE TABLET BY MOUTH TWICE A DAY BEFORE MEALS Patient taking  differently: Take 40 mg by mouth daily.  04/01/17   [name], MD  traZODone (DESYREL) 100 MG tablet Take 50 mg by mouth at bedtime.     [provider]    Family History Family History  Adopted: Yes  Problem Relation Age of Onset  . Autism Son   . ADD / ADHD Son   . Apraxia Son     Social History Social History   Tobacco Use  . Smoking status: Former Smoker    Years: 4.00    Last attempt to quit: 05/22/2017    Years since quitting: 1.7  . Smokeless tobacco: Never Used  Substance Use Topics  . Alcohol use: Yes    Comment: RARE  . Drug use: No    Types: Marijuana     Allergies   Bactrim [sulfamethoxazole-trimethoprim] and Penicillins   Review of Systems Review of Systems  Constitutional: Negative for activity change, appetite change, chills and fever.  Respiratory: Negative for cough and shortness of breath.        Difficulty breathing through her trach. Feels  like it is clogged and she is unable to clog it.  Cardiovascular: Negative for chest pain and palpitations.  Gastrointestinal: Negative for abdominal pain, nausea and vomiting.  Genitourinary: Negative for dysuria.  Musculoskeletal: Negative for back pain.  Skin: Negative for rash and wound.  Neurological: Negative for syncope and headaches.  All other systems reviewed and are negative.    Physical Exam Updated Vital Signs BP (!) 157/95   Pulse 94   Temp 97.7 F (36.5 C) (Oral)   Resp (!) 23   SpO2 98%   Physical Exam Vitals signs and nursing note reviewed.  Constitutional:      General: She is not in acute distress.    Appearance: She is well-developed. She is obese. She is not ill-appearing.  HENT:     Head: Normocephalic and atraumatic.  Eyes:     Conjunctiva/sclera: Conjunctivae normal.  Neck:     Musculoskeletal: Neck supple.  Cardiovascular:     Rate and Rhythm: Regular rhythm. Tachycardia present.     Heart sounds: No murmur.  Pulmonary:     Effort: Pulmonary effort is normal. No respiratory distress.     Breath sounds: Normal breath sounds. No wheezing, rhonchi or rales.  Abdominal:     Palpations: Abdomen is soft.     Tenderness: There is no abdominal tenderness. There is no guarding.  Skin:    General: Skin is warm and dry.  Neurological:     Mental Status: She is alert and oriented to person, place, and time.     Motor: No abnormal muscle tone.      ED Treatments / Results  Labs (all labs ordered are listed, but only abnormal results are displayed) Labs Reviewed - No data to display  EKG None  Radiology No results found.  Procedures Procedures (including critical care time)  Medications Ordered in ED Medications - No data to display   Initial Impression / Assessment and Plan / ED Course  I have reviewed the triage vital signs and the nursing notes.  Pertinent labs & imaging results that were available during my care of the patient were  reviewed by me and considered in my medical decision making (see chart for details).   [name] is a 43 y.o. female the past medical history of morbid obesity, sleep apnea, Cushing's disease presents with request for tracheostomy change.  Patient states that her trach  has come back up she is having difficulty suctioning it out.  States she supposed to get it changed every 3 weeks. However, due to COVID the office has been closed and she has been unable to get it changed out for the past 2 months.   On initial exam patient well appearing, not in acute distress. VSS (initially tachycardic to 100 but this resolved after trach change). Physical exam as above.  Tracheostomy was changed at bedside by RT.  MD was present during this.  Patient states improvement and resolution of breathing difficulties through tracheostomy once replaced.  At this time she has no other complaints or symptoms.  We will tracheostomy examined and showed thick dried secretions in the tubing.  Do not feel further work-up is required at this time.  Patient discharged home in stable condition.  No further questions or concerns at this time.  Return precautions given.  Final Clinical Impressions(s) / ED Diagnoses   Final diagnoses:  Encounter for tracheostomy tube change [facility])    ED Discharge Orders    None       Doneta Public, MD 02/06/19 2142    Drenda Freeze, MD 02/07/19 2252

## 2019-02-06 NOTE — ED Notes (Signed)
Pt to bathroom

## 2019-02-06 NOTE — ED Triage Notes (Signed)
Trach clogged and trach clinic closed. She is leaving for beach out of town tomorrow. SHe states backed up and cannot suction it out. SHe states she is having hard time talking and is SOB.

## 2019-02-06 NOTE — ED Notes (Signed)
Pt alert no respiratory distress

## 2019-03-23 ENCOUNTER — Other Ambulatory Visit (HOSPITAL_COMMUNITY)
Admission: RE | Admit: 2019-03-23 | Discharge: 2019-03-23 | Disposition: A | Discharge status: Home / Self Care | Payer: Medicaid Other | Source: Ambulatory Visit | Attending: Acute Care | Admitting: Acute Care

## 2019-03-23 DIAGNOSIS — Z1159 Encounter for screening for other viral diseases: Secondary | ICD-10-CM | POA: Diagnosis present

## 2019-03-24 LAB — SARS CORONAVIRUS 2 (TAT 6-24 HRS): SARS Coronavirus 2: NEGATIVE

## 2019-03-26 ENCOUNTER — Other Ambulatory Visit: Payer: Self-pay

## 2019-03-26 ENCOUNTER — Ambulatory Visit (HOSPITAL_COMMUNITY)
Admission: RE | Admit: 2019-03-26 | Discharge: 2019-03-26 | Disposition: A | Discharge status: Home / Self Care | Payer: Medicaid Other | Source: Ambulatory Visit | Attending: Acute Care | Admitting: Acute Care

## 2019-03-26 ENCOUNTER — Other Ambulatory Visit: Payer: Self-pay | Admitting: Acute Care

## 2019-03-26 ENCOUNTER — Telehealth: Payer: Self-pay

## 2019-03-26 DIAGNOSIS — Z930 Tracheostomy status: Secondary | ICD-10-CM

## 2019-03-26 DIAGNOSIS — J398 Other specified diseases of upper respiratory tract: Secondary | ICD-10-CM | POA: Diagnosis not present

## 2019-03-26 DIAGNOSIS — E662 Morbid (severe) obesity with alveolar hypoventilation: Secondary | ICD-10-CM | POA: Diagnosis not present

## 2019-03-26 DIAGNOSIS — Z430 Encounter for attention to tracheostomy: Secondary | ICD-10-CM | POA: Diagnosis not present

## 2019-03-26 DIAGNOSIS — I4891 Unspecified atrial fibrillation: Secondary | ICD-10-CM | POA: Diagnosis not present

## 2019-03-26 DIAGNOSIS — J9601 Acute respiratory failure with hypoxia: Secondary | ICD-10-CM

## 2019-03-26 NOTE — Progress Notes (Signed)
[facility]   Reason for visit:  Routine tracheostomy change HPI:  43 year old obese white female well-known to me.  I follow her for tracheostomy management.  She has a significant history of obesity hypoventilation syndrome, tracheal stenosis, and obstructive sleep apnea.  She has been tracheostomy dependent now for over a year following a critical illness.  Of note she also has a significant psychiatric history.  Presents today for routine tracheostomy change.  She has no acute complaints in regards to her tracheostomy ROS  Review of Systems - History obtained from the patient General ROS: negative Psychological ROS: positive for - depression ENT ROS: positive for - nasal congestion and Intermittent thick tracheal secretions which include tracheostomy negative for - epistaxis, headaches, hearing change, nasal polyps, oral lesions, sinus pain, sneezing, sore throat, tinnitus, vertigo or visual changes Allergy and Immunology ROS: negative Hematological and Lymphatic ROS: negative for - bleeding problems, blood clots, fatigue or night sweats Endocrine ROS: negative for - galactorrhea, mood swings or temperature intolerance Respiratory ROS: positive for - cough and shortness of breath negative for - hemoptysis Cardiovascular ROS: no chest pain or dyspnea on exertion Gastrointestinal ROS: no abdominal pain, change in bowel habits, or black or bloody stools Musculoskeletal ROS: negative Neurological ROS: no TIA or stroke symptoms  Vital signs:  Reviewed.  Heart rate 119, saturations 97% on room air with tracheostomy Passy-Muir valve in place Exam:  Physical Exam Constitutional:      General: She is not in acute distress.    Appearance: Normal appearance. She is obese. She is not ill-appearing or toxic-appearing.  HENT:     Head: Atraumatic.     Right Ear: There is impacted cerumen.     Mouth/Throat:     Mouth: Mucous membranes are moist.     Pharynx:  Oropharynx is clear. No oropharyngeal exudate or posterior oropharyngeal erythema.  Eyes:     General: No scleral icterus.    Pupils: Pupils are equal, round, and reactive to light.  Neck:     Musculoskeletal: Normal range of motion and neck supple.     Comments: Tracheostoma is unremarkable.  Some mild erythema located at 6:00 in relation to the stoma itself however no skin breakdown.  She is able to phonate well with a size 6 proximal XLT tracheostomy Cardiovascular:     Rate and Rhythm: Normal rate and regular rhythm.     Pulses: Normal pulses.     Heart sounds: Normal heart sounds.  Pulmonary:     Effort: Respiratory distress present.     Breath sounds: Normal breath sounds.  Abdominal:     Palpations: Abdomen is soft.  Musculoskeletal: Normal range of motion.  Skin:    General: Skin is warm and dry.  Neurological:     General: No focal deficit present.     Mental Status: She is alert and oriented to person, place, and time.  Psychiatric:        Mood and Affect: Mood normal.     Trach change/procedure: The size 6 proximal XLT tracheostomy was removed without difficulty.  The tracheostomy stoma site was localized with viscous lidocaine.  After time for this to kick in we placed a size 6 cuffless proximal XLT in the stoma without difficulty end-tidal CO2 was checked and confirmative      Impression/dx  Tracheostomy dependence Obesity hypoventilation syndrome Severe obstructive sleep apnea History of atrial fibrillation  Discussion  Ibtisam is doing well from a tracheostomy standpoint.  Airway maintenance  seems to be her biggest barrier to improvement however she seems to have hit a plateau. Plan  Passy-Muir valve provided Return office visit 8 weeks Initiated paperwork for home oximetry test to determine oxygen needs    Visit time: 29 minutes.   Erick Colace ACNP-BC [place]

## 2019-03-26 NOTE — Telephone Encounter (Signed)
-----   Message from Erick Colace, NP sent at 03/26/2019 11:39 AM EDT ----- Regarding: DME order Hey team  I need you to place an order for Overnight oximetry on room air.   The patient's DME is [employer]  Thanks  [employer]

## 2019-03-26 NOTE — Progress Notes (Signed)
Tracheostomy Procedure Note  [name] [id number] Feb 22, 1976  Pre Procedure Tracheostomy Information  Trach Brand: Shiley Size: 6.0  XLT Uncuffed Proxcimal Style: Uncuffed Secured by: Velcro   Procedure: Trach cleaning and  Trach changed Vitals prior to trach change  124/79  RR 20  Hr 119  Oxygen saturation 97% on RA   Post Procedure Tracheostomy Information  Trach Brand: Shiley Size: 6.0  XLT Style: Proximal and Uncuffed Secured by: Velcro   Post Procedure Evaluation:  ETCO2 positive color change from yellow to purple : Yes.   Vital signs:blood pressure 122/86  pulse 101 respirations 18 and pulse oximetry 97 % on RA Patients current condition: stable Complications: No apparent complications Trach site exam: yes  lidocaine jelly applied before insertion of new trach/ clean and dry Wound care done;cleaned and dried Patient did tolerate procedure well.   Education: None  Prescription needs: Addressed with NP at this visit    Additional needs: New PM valve given to patient at this visit

## 2019-03-26 NOTE — Telephone Encounter (Signed)
Order placed. Nothing further needed at this time. 

## 2019-04-16 ENCOUNTER — Other Ambulatory Visit: Payer: Self-pay | Admitting: Chiropractic Medicine

## 2019-04-16 DIAGNOSIS — M542 Cervicalgia: Secondary | ICD-10-CM

## 2019-04-22 DIAGNOSIS — Z430 Encounter for attention to tracheostomy: Secondary | ICD-10-CM | POA: Insufficient documentation

## 2019-04-22 NOTE — Addendum Note (Signed)
Encounter addended by: Erick Colace, NP on: 04/22/2019 3:38 PM  Actions taken: Clinical Note Signed, Problem List modified, Charge Capture section accepted

## 2019-04-23 ENCOUNTER — Ambulatory Visit
Admission: RE | Admit: 2019-04-23 | Discharge: 2019-04-23 | Disposition: A | Discharge status: Home / Self Care | Payer: Medicaid Other | Source: Ambulatory Visit | Attending: Chiropractic Medicine | Admitting: Chiropractic Medicine

## 2019-04-23 DIAGNOSIS — M542 Cervicalgia: Secondary | ICD-10-CM

## 2019-04-27 ENCOUNTER — Other Ambulatory Visit: Payer: Self-pay | Admitting: Chiropractic Medicine

## 2019-04-27 DIAGNOSIS — E041 Nontoxic single thyroid nodule: Secondary | ICD-10-CM

## 2019-04-28 ENCOUNTER — Telehealth: Payer: Self-pay | Admitting: Gastroenterology

## 2019-04-28 ENCOUNTER — Other Ambulatory Visit: Payer: Self-pay | Admitting: Gastroenterology

## 2019-04-28 MED ORDER — CHOLESTYRAMINE 4 G PO PACK: 4.0000 g | PACK | ORAL | 1 refills | Status: DC

## 2019-04-28 NOTE — Telephone Encounter (Signed)
Scheduled an office visit for 05-21-19 at [facility] but she needs is wondering if there is something she can take in the meantime even if it's over the counter.

## 2019-04-28 NOTE — Telephone Encounter (Signed)
LMOM for patient to call the office. She needs an appointment for this medication refill. May see one of our extenders

## 2019-04-28 NOTE — Telephone Encounter (Signed)
Spoke to patient, sent in enough Cholestyramine until follow up appointment with Dr Ardis Hughs in September.

## 2019-04-29 ENCOUNTER — Other Ambulatory Visit (HOSPITAL_COMMUNITY): Payer: Self-pay | Admitting: Chiropractic Medicine

## 2019-04-29 DIAGNOSIS — E041 Nontoxic single thyroid nodule: Secondary | ICD-10-CM

## 2019-04-29 IMAGING — CT CT HEAD W/O CM
4 series · 16 of 47 positions shown, 18 images · non-contrast
Comparison: None

CLINICAL DATA: 41-year-old female with altered mental status.

EXAM:
CT HEAD WITHOUT CONTRAST
TECHNIQUE: Contiguous axial images were obtained from the base of the skull
through the vertex without intravenous contrast.

[Series 3: head wo · axial · 0.48mm/px · z∈[-183,-48]mm · 7 of 37 slices shown, 9 images]
[im 5/37  brain]
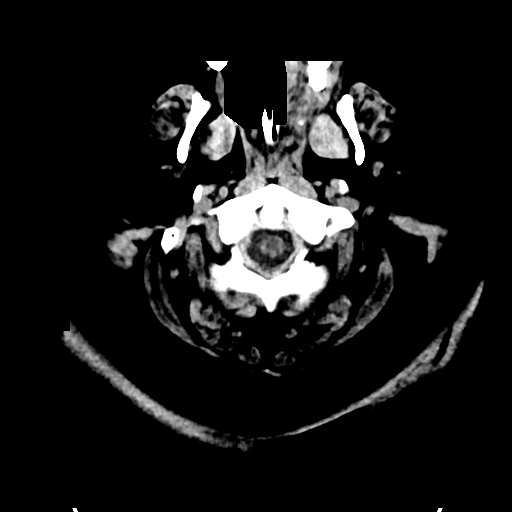
[im 5/37  bone]
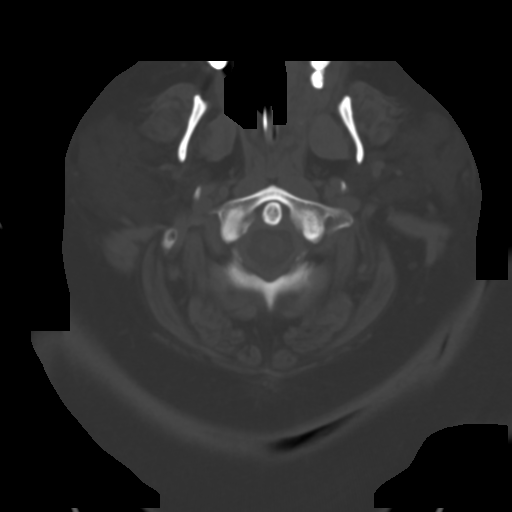
[im 10/37  brain]
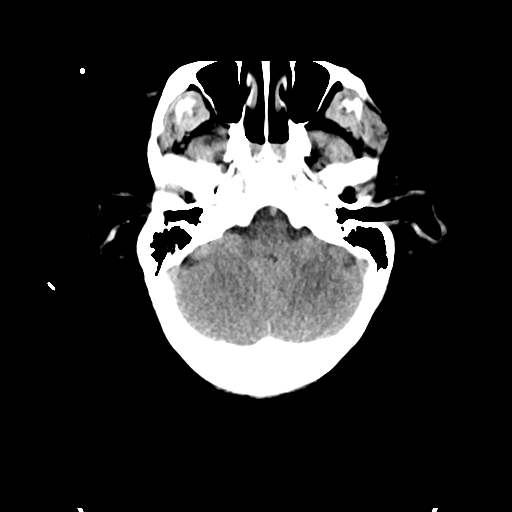
[im 14/37  brain]
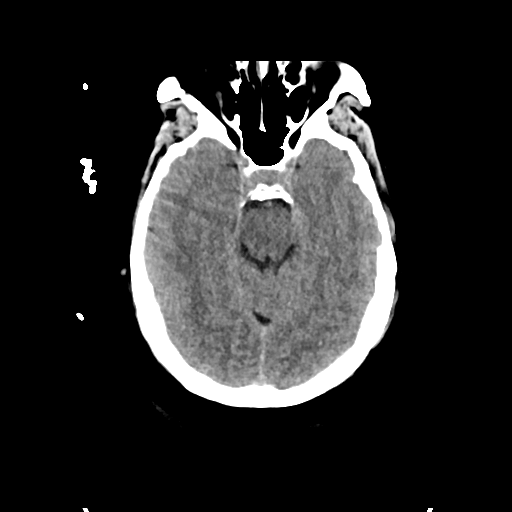
[im 19/37  brain]
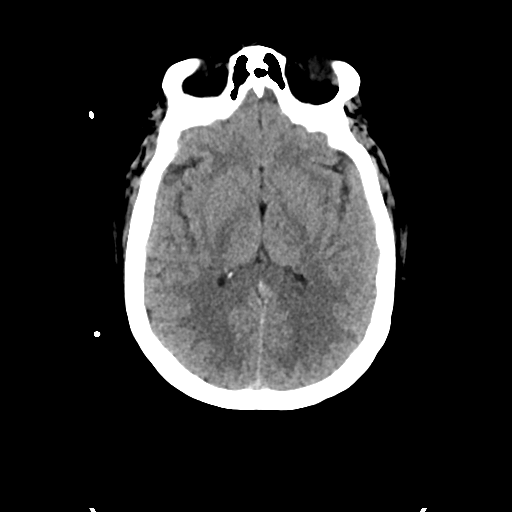
[im 23/37  brain]
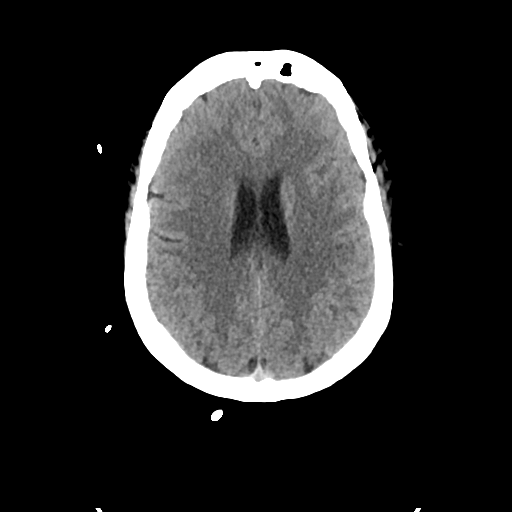
[im 23/37  bone]
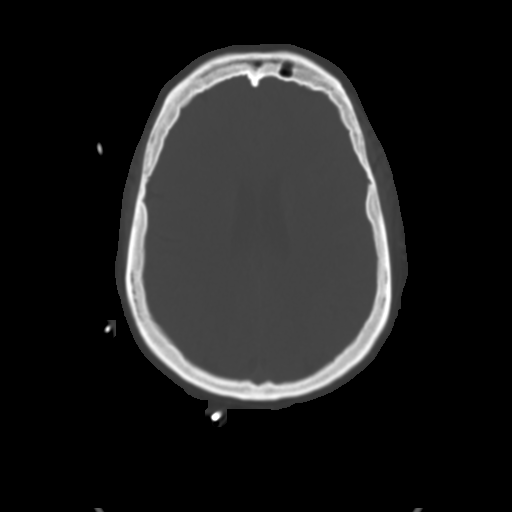
[im 28/37  brain]
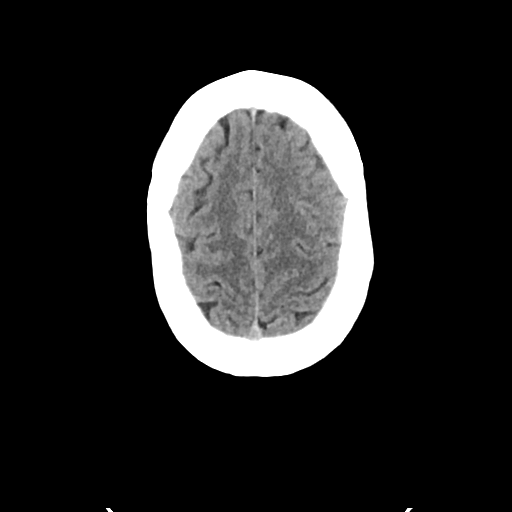
[im 32/37  brain]
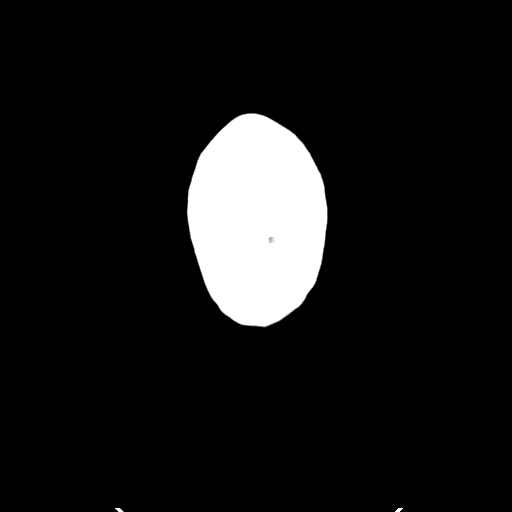

[Series 4: head bone · axial · 0.48mm/px · z∈[-185,-149]mm · 3 of 91 slices shown]
[im 10/91  bone]
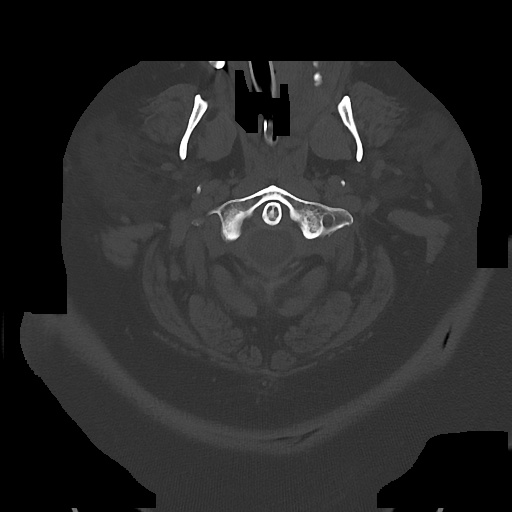
[im 19/91  bone]
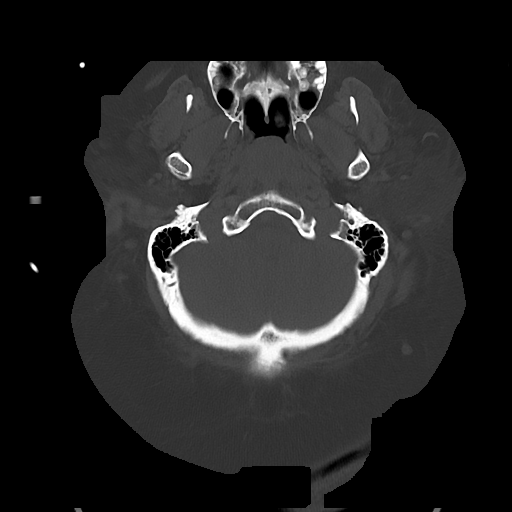
[im 28/91  bone]
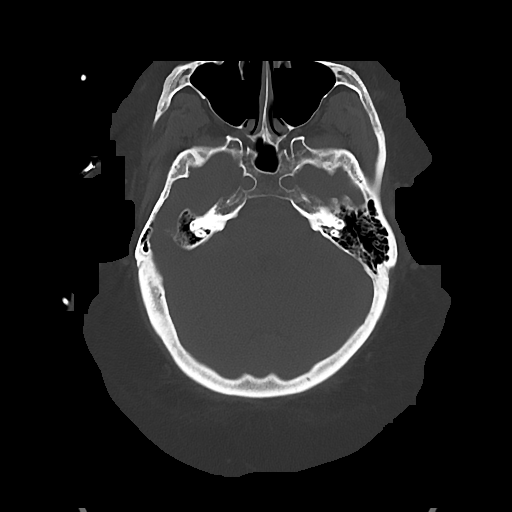

[Series 5: cor soft · coronal · 0.35mm/px · 3 of 74 slices shown]
[im 25/74  brain]
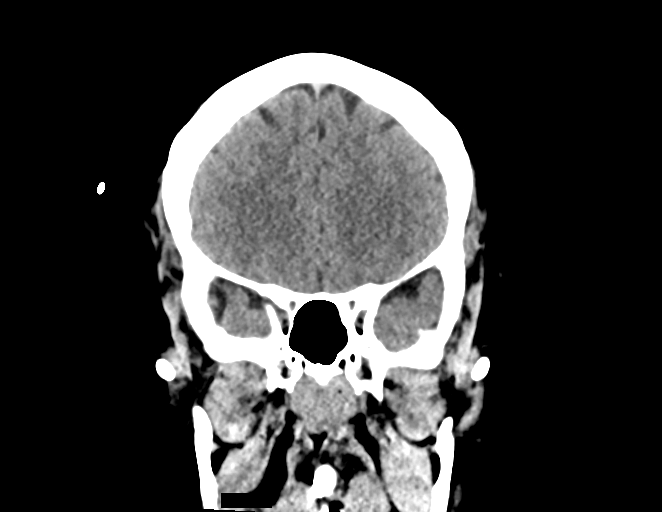
[im 33/74  brain]
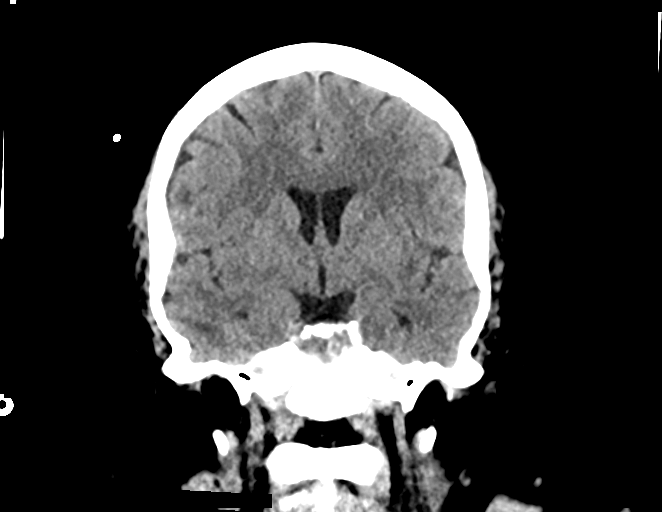
[im 41/74  brain]
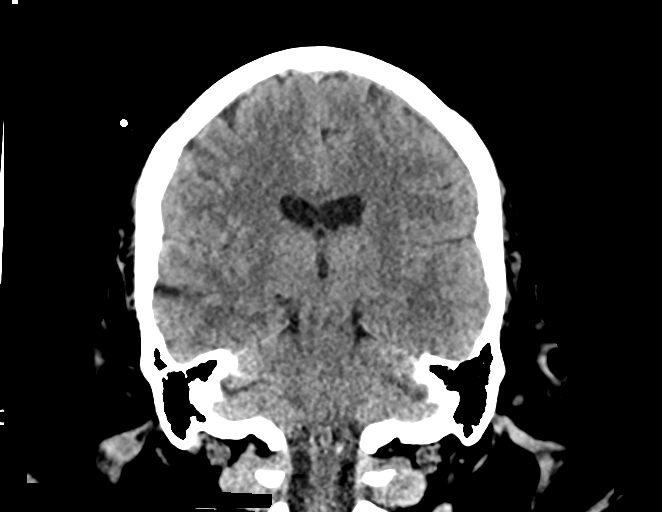

[Series 6: sag soft · sagittal · 0.36mm/px · 3 of 67 slices shown]
[im 23/67  brain]
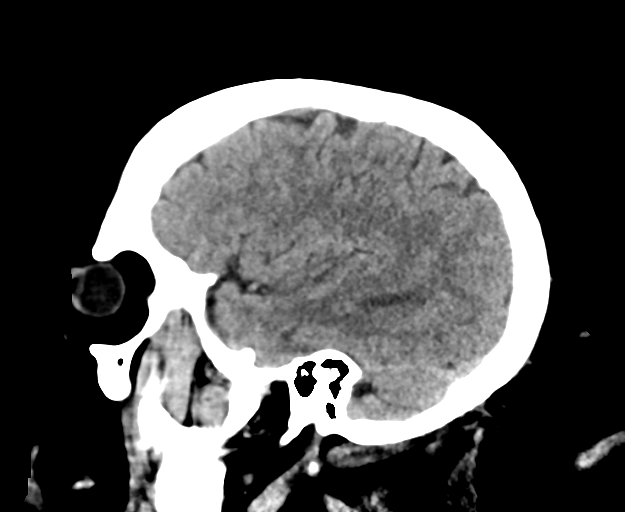
[im 34/67  brain]
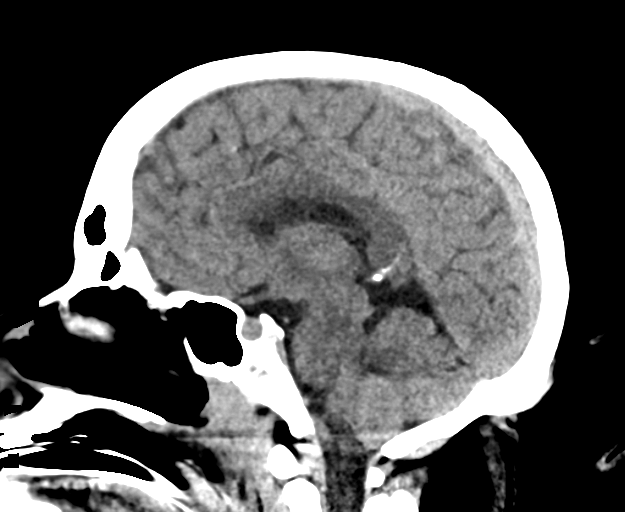
[im 45/67  brain]
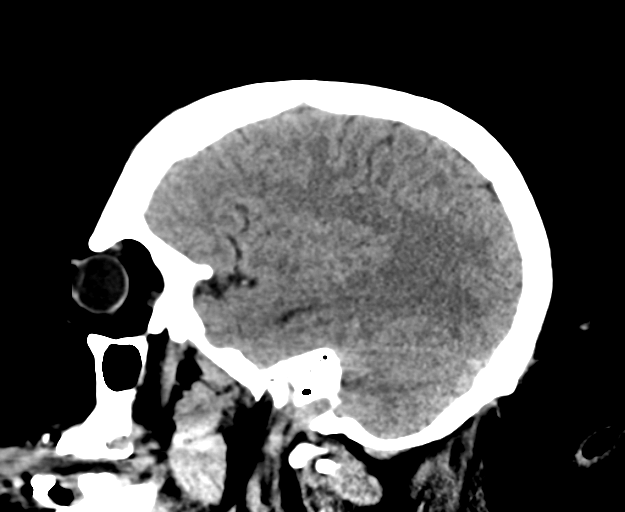

[16 of 47 positions shown; findings below may reference images not displayed]

FINDINGS: Brain: The ventricles and sulci appropriate size for patient's age.
The gray-white matter discrimination is preserved. There is no acute
intracranial hemorrhage. No mass effect or midline shift. No
extra-axial fluid collection.

Vascular: No hyperdense vessel or unexpected calcification.

Skull: Normal. Negative for fracture or focal lesion.

Sinuses/Orbits: No acute finding.

Other: Partially visualized endotracheal and enteric tubes.
IMPRESSION: Unremarkable noncontrast CT of the brain.

## 2019-04-30 ENCOUNTER — Telehealth: Payer: Self-pay | Admitting: Acute Care

## 2019-04-30 NOTE — Telephone Encounter (Signed)
Rodena Piety, have you received a CMN for this patient?

## 2019-05-01 NOTE — Telephone Encounter (Signed)
Will leave encounter open for follow up since message routed to [place] late (5pm) yesterday.

## 2019-05-01 NOTE — Telephone Encounter (Signed)
This has been received and faxed back to [facility]. I did receive confirmation that the fax was received

## 2019-05-07 ENCOUNTER — Other Ambulatory Visit: Payer: Self-pay

## 2019-05-07 ENCOUNTER — Ambulatory Visit (HOSPITAL_COMMUNITY)
Admission: RE | Admit: 2019-05-07 | Discharge: 2019-05-07 | Disposition: A | Discharge status: Home / Self Care | Payer: Medicaid Other | Source: Ambulatory Visit | Attending: Chiropractic Medicine | Admitting: Chiropractic Medicine

## 2019-05-07 DIAGNOSIS — E041 Nontoxic single thyroid nodule: Secondary | ICD-10-CM | POA: Insufficient documentation

## 2019-05-07 MED ORDER — LIDOCAINE HCL (PF) 1 % IJ SOLN
INTRAMUSCULAR | Status: AC
  Filled 2019-05-07: qty 30

## 2019-05-07 NOTE — Procedures (Signed)
Interventional Radiology Procedure:   Indications: Right thyroid nodule  Procedure: US guided FNA  Findings: 4 FNAs of right thyroid nodule  Complications: none     EBL: less than 10 ml  Plan: Discharge to home.     Yolanda Dockendorf R. Anselm Pancoast, MD  Pager: [phone]

## 2019-05-08 ENCOUNTER — Telehealth: Payer: Self-pay | Admitting: Acute Care

## 2019-05-08 NOTE — Telephone Encounter (Signed)
Well all the CMNs that I have had on this patient has been faxed but I will refax them on Monday

## 2019-05-11 NOTE — Telephone Encounter (Signed)
Will close left messages encounter since [facility] has responded.

## 2019-05-12 ENCOUNTER — Encounter (HOSPITAL_COMMUNITY): Payer: Self-pay | Admitting: Emergency Medicine

## 2019-05-12 ENCOUNTER — Other Ambulatory Visit: Payer: Self-pay

## 2019-05-12 ENCOUNTER — Emergency Department (HOSPITAL_COMMUNITY)
Admission: EM | Admit: 2019-05-12 | Discharge: 2019-05-12 | Disposition: A | Discharge status: Home / Self Care | Payer: Medicaid Other | Attending: Emergency Medicine | Admitting: Emergency Medicine

## 2019-05-12 ENCOUNTER — Emergency Department (HOSPITAL_COMMUNITY): Payer: Medicaid Other

## 2019-05-12 DIAGNOSIS — E669 Obesity, unspecified: Secondary | ICD-10-CM | POA: Insufficient documentation

## 2019-05-12 DIAGNOSIS — Z930 Tracheostomy status: Secondary | ICD-10-CM | POA: Insufficient documentation

## 2019-05-12 DIAGNOSIS — R0602 Shortness of breath: Secondary | ICD-10-CM

## 2019-05-12 DIAGNOSIS — Z79899 Other long term (current) drug therapy: Secondary | ICD-10-CM | POA: Diagnosis not present

## 2019-05-12 DIAGNOSIS — E119 Type 2 diabetes mellitus without complications: Secondary | ICD-10-CM | POA: Diagnosis not present

## 2019-05-12 DIAGNOSIS — Z20828 Contact with and (suspected) exposure to other viral communicable diseases: Secondary | ICD-10-CM | POA: Diagnosis not present

## 2019-05-12 DIAGNOSIS — Z794 Long term (current) use of insulin: Secondary | ICD-10-CM | POA: Diagnosis not present

## 2019-05-12 DIAGNOSIS — Z87891 Personal history of nicotine dependence: Secondary | ICD-10-CM | POA: Diagnosis not present

## 2019-05-12 DIAGNOSIS — E039 Hypothyroidism, unspecified: Secondary | ICD-10-CM | POA: Diagnosis not present

## 2019-05-12 DIAGNOSIS — Z6841 Body Mass Index (BMI) 40.0 and over, adult: Secondary | ICD-10-CM | POA: Diagnosis not present

## 2019-05-12 DIAGNOSIS — I10 Essential (primary) hypertension: Secondary | ICD-10-CM | POA: Insufficient documentation

## 2019-05-12 NOTE — Progress Notes (Signed)
RT used easy cap CO2 detector to assess trach placement. Breath sounds are equal and good color change on CO2 detector. No respiratory distress noted.

## 2019-05-12 NOTE — ED Triage Notes (Signed)
Pt to ED with c/o shortness of breath x's approx 1 1/2 hours.  Pt has a trach and st's she thinks it needs to be changed.  Pt st's she has it changed every 4-8 weeks and it was last changed 5 or 6 weeks ago.  Pt speaking in full sentences

## 2019-05-12 NOTE — ED Provider Notes (Signed)
[facility] EMERGENCY DEPARTMENT Provider Note   CSN: [id number] Arrival date & time: 05/12/19  1939     History   Chief Complaint Chief Complaint  Patient presents with   Shortness of Breath    HPI Murle L Staunton is a 43 y.o. female.     HPI Patient has chronic tracheostomy.  She reports she thinks it needs to be replaced.  She reports that needs to be replaced about every 4 to 8 weeks.  She is able to clean the inner cannula which does not appear obstructed but reports that when she starts to feel this, shortness of breath is because there is some mucus buildup.  Reports it started about an hour and half ago.  There is been no cough no fever no chills. Past Medical History:  Diagnosis Date   Anxiety    Complication of anesthesia    Pt. states takes long time to wake up from it.    Cushing's disease ([place])    Depression    Diabetes ([place])    Hyperlipidemia    Hyperlipidemia    Hypertension    Morbid obesity (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[place]), R tibial plateau  07/18/2015   Sleep apnea    Uncontrolled diabetes mellitus with diabetic neuropathy, with long-term current use of insulin ([place]) 07/16/2015   Vitamin D deficiency 07/19/2015    Patient Active Problem List   Diagnosis Date Noted   Tracheostomy care [facility])    Pyelonephritis 07/16/2018   UTI (urinary tract infection) 07/14/2018   Sepsis secondary to UTI ([place]) 07/05/2018   AKI (acute kidney injury) ([place]) 07/05/2018   Allergic reaction caused by a drug 07/05/2018   Tracheostomy complication ([place])    Tracheostomy dependence ([place])    OSA (obstructive sleep apnea)    Depression    Acute respiratory failure with hypoxemia (HCC)    SOB (shortness of breath)    Tracheostomy status ([place])    Hoarse voice quality    Hypomagnesemia    Shortness of breath    Major depressive disorder, recurrent episode, severe  ([place])    Intentional overdose of drug in tablet form ([place]) 11/20/2017   Intentional drug overdose ([place])    Seizure ([place])    Acute respiratory failure with hypercapnia ([place])    Status epilepticus ([place])    Spondylosis without myelopathy or radiculopathy, cervical region 10/29/2017   Abdominal pain, epigastric    LUQ abdominal pain    Nausea and vomiting    Gastroparesis    Hypertriglyceridemia 07/22/2015   Diabetes mellitus due to underlying condition, uncontrolled, with diabetic neuropathy, with long-term current use of insulin (HCC)    Vitamin D deficiency 07/19/2015   Pathological fracture due to secondary osteoporosis, R tibial plateau  07/19/2015   Osteoporosis 07/19/2015   Periprosthetic fracture 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[place]), R tibial plateau  07/18/2015   Fracture, tibial plateau 07/16/2015   Uncontrolled diabetes mellitus with diabetic neuropathy, with long-term current use of insulin ([place]) 07/16/2015   Leukocytosis 07/16/2015   Hypertension associated with diabetes ([place]) 07/16/2015   Obesity hypoventilation syndrome ([place]) 04/20/2014   Hyperlipidemia associated with type 2 diabetes mellitus ([place]) 07/31/2012   Cushing disease ([place]) 07/31/2012   Hypothyroid 07/31/2012    Past Surgical History:  Procedure Laterality Date   ANTERIOR TALOFIBULAR LIGAMENT REPAIR Left 11/15/2014   Procedure: ANTERIOR TALOFIBULAR LIGAMENT REPAIR;  Surgeon: Jana Half, DPM;  Location: [facility];  Service: Podiatry;  Laterality: Left;   CESAREAN SECTION  dec 1997/  06-03-2001/   01-01-2005   BILATERAL TUBAL LIGATION WITH LAST ONE   DILATION AND CURETTAGE OF UTERUS  1995   WITH SUCTION   DIRECT LARYNGOSCOPY N/A 12/18/2017   Procedure: DIRECT LARYNGOSCOPY;  Surgeon: Izora Gala, MD;  Location: [facility];  Service: ENT;  Laterality: N/A;   ESOPHAGOGASTRODUODENOSCOPY (EGD) WITH PROPOFOL N/A 10/04/2016   Procedure: ESOPHAGOGASTRODUODENOSCOPY (EGD)  WITH PROPOFOL;  Surgeon: [name], MD;  Location: [facility];  Service: Endoscopy;  Laterality: N/A;   LAPAROSCOPIC CHOLECYSTECTOMY  09-25-2005   ORIF TIBIA PLATEAU Right 07/19/2015   Procedure: OPEN REDUCTION INTERNAL FIXATION (ORIF) RIGHT TIBIAL PLATEAU;  Surgeon: Altamese Scenic, MD;  Location: [facility];  Service: Orthopedics;  Laterality: Right;   PARTIAL KNEE ARTHROPLASTY Right 04/19/2014   Procedure: RIGHT UNI KNEE ARTHROPLASTY MEDIALLY ;  Surgeon: Mauri Pole, MD;  Location: [facility];  Service: Orthopedics;  Laterality: Right;   TRACHEOSTOMY TUBE PLACEMENT N/A 11/28/2017   Procedure: TRACHEOSTOMY;  Surgeon: Izora Gala, MD;  Location: [facility];  Service: ENT;  Laterality: N/A;   TUBAL LIGATION       OB History   No obstetric history on file.      Home Medications    Prior to Admission medications   Medication Sig Start Date End Date Taking? Authorizing Provider  fluticasone (FLONASE) 50 MCG/ACT nasal spray Place 2 sprays into both nostrils daily. Patient taking differently: Place 1 spray into both nostrils daily.  04/14/18 05/12/19 Yes Erick Colace, NP  atorvastatin (LIPITOR) 40 MG tablet Take 40 mg by mouth at bedtime.     [provider]  azelastine (ASTELIN) 0.1 % nasal spray Place 2 sprays into both nostrils 2 (two) times daily. Use in each nostril as directed 09/25/18   Erick Colace, NP  buPROPion (WELLBUTRIN XL) 300 MG 24 hr tablet Take 300 mg by mouth daily.    [provider]  cholestyramine (QUESTRAN) 4 g packet Take 1 packet (4 g total) by mouth every other day. 04/28/19   [name], MD  dextromethorphan-guaiFENesin [facility] DM) 30-600 MG 12hr tablet Take 1 tablet by mouth daily.    [provider]  gabapentin (NEURONTIN) 600 MG tablet Take 600 mg by mouth 2 (two) times daily.     [provider]  hydrOXYzine (VISTARIL) 50 MG capsule Take 50 mg by mouth at bedtime.    [provider]  Insulin Detemir (LEVEMIR  FLEXTOUCH) 100 UNIT/ML Pen Inject 30 Units into the skin 2 (two) times daily. 07/16/18   Nita Sells, MD  levothyroxine (SYNTHROID, LEVOTHROID) 125 MCG tablet Take 125 mcg by mouth daily before breakfast.    [provider]  lisinopril (PRINIVIL,ZESTRIL) 2.5 MG tablet Take 2.5 mg by mouth daily.    [provider]  metFORMIN (GLUCOPHAGE) 1000 MG tablet Take 1,000 mg by mouth 2 (two) times daily with a meal.    [provider]  metoprolol tartrate (LOPRESSOR) 25 MG tablet Take 0.5 tablets (12.5 mg total) by mouth 2 (two) times daily. Patient not taking: Reported on 07/14/2018 01/06/18   Nita Sells, MD  ondansetron (ZOFRAN) 4 MG tablet Take 4 mg by mouth every 8 (eight) hours as needed for nausea or vomiting.    [provider]  pantoprazole (PROTONIX) 40 MG tablet TAKE ONE TABLET BY MOUTH TWICE A DAY BEFORE MEALS Patient taking differently: Take 40 mg by mouth daily.  04/01/17  [name], MD  traZODone (DESYREL) 100 MG tablet Take 50 mg by mouth at bedtime.     [provider]    Family History Family History  Adopted: Yes  Problem Relation Age of Onset   Autism Son    ADD / ADHD Son    Apraxia Son     Social History Social History   Tobacco Use   Smoking status: Former Smoker    Years: 4.00    Quit date: 05/22/2017    Years since quitting: 1.9   Smokeless tobacco: Never Used  Substance Use Topics   Alcohol use: Yes    Comment: RARE   Drug use: No    Types: Marijuana     Allergies   Bactrim [sulfamethoxazole-trimethoprim] and Penicillins   Review of Systems Review of Systems 10 Systems reviewed and are negative for acute change except as noted in the HPI.   Physical Exam Updated Vital Signs BP (!) 145/77 (BP Location: Right Arm)    Pulse (!) 121    Temp 98.3 F (36.8 C) (Oral)    Resp (!) 29    Ht 5\' 2"  (1.575 m)    Wt 124.7 kg    SpO2 96%    BMI 50.30 kg/m   Physical Exam Constitutional:        Comments: Patient is alert and nontoxic.  She is up and ambulating in the emergency department  HENT:     Head: Normocephalic and atraumatic.  Neck:     Comments: Lurline Idol site is clean and dry.  The inner cannula is clean without mucus or obstruction and is able to pass easily. Cardiovascular:     Rate and Rhythm: Normal rate and regular rhythm.  Pulmonary:     Effort: Pulmonary effort is normal.     Breath sounds: Normal breath sounds.  Musculoskeletal: Normal range of motion.  Skin:    General: Skin is warm and dry.  Neurological:     General: No focal deficit present.     Mental Status: She is oriented to person, place, and time.     Coordination: Coordination normal.  Psychiatric:        Mood and Affect: Mood normal.      ED Treatments / Results  Labs (all labs ordered are listed, but only abnormal results are displayed) Labs Reviewed - No data to display  EKG None  Radiology No results found.  Procedures Procedures (including critical care time)  Medications Ordered in ED Medications - No data to display   Initial Impression / Assessment and Plan / ED Course  I have reviewed the triage vital signs and the nursing notes.  Pertinent labs & imaging results that were available during my care of the patient were reviewed by me and considered in my medical decision making (see chart for details).       Respiratory has examined the trach.  They find that it is clean and intact without necessitating change at this time.  Patient is alert and nontoxic.  She is ambulatory about the emergency department without difficulty.  COVID test obtained so patient can follow-up with the trach clinic this week for recheck.  Discharged in stable condition.  Final Clinical Impressions(s) / ED Diagnoses   Final diagnoses:  SOB (shortness of breath)    ED Discharge Orders    None       Charlesetta Shanks, MD 05/17/19 1439

## 2019-05-12 NOTE — ED Notes (Signed)
Pt ambulatory to bathroom without any problems 

## 2019-05-12 NOTE — Discharge Instructions (Addendum)
Your covid test should be done in the next 24-48 hours. Follow up at your trach clinic this week.

## 2019-05-12 NOTE — Progress Notes (Signed)
RT called to assess trach. Patient is on room air with spo2 98%. RR 20. RT inspected inner cannula on trach, no occlusion. RT able to pass 8f suction catheter with no difficulty. Patient is still able to talk with PMV removed. No secretions. Breath sounds are clear, diminished and equal bilaterally. RT informed RN. RT will monitor as needed.

## 2019-05-13 LAB — SARS CORONAVIRUS 2 (TAT 6-24 HRS): SARS Coronavirus 2: NEGATIVE

## 2019-05-20 ENCOUNTER — Emergency Department (HOSPITAL_COMMUNITY)
Admission: EM | Admit: 2019-05-20 | Discharge: 2019-05-20 | Disposition: A | Discharge status: Home / Self Care | Payer: Medicaid Other | Attending: Emergency Medicine | Admitting: Emergency Medicine

## 2019-05-20 ENCOUNTER — Other Ambulatory Visit: Payer: Self-pay

## 2019-05-20 ENCOUNTER — Encounter: Payer: Self-pay | Admitting: *Deleted

## 2019-05-20 ENCOUNTER — Emergency Department (HOSPITAL_COMMUNITY): Payer: Medicaid Other

## 2019-05-20 DIAGNOSIS — Z87891 Personal history of nicotine dependence: Secondary | ICD-10-CM | POA: Diagnosis not present

## 2019-05-20 DIAGNOSIS — E119 Type 2 diabetes mellitus without complications: Secondary | ICD-10-CM | POA: Diagnosis not present

## 2019-05-20 DIAGNOSIS — R0602 Shortness of breath: Secondary | ICD-10-CM | POA: Diagnosis present

## 2019-05-20 DIAGNOSIS — E249 Cushing's syndrome, unspecified: Secondary | ICD-10-CM | POA: Diagnosis not present

## 2019-05-20 DIAGNOSIS — Z430 Encounter for attention to tracheostomy: Secondary | ICD-10-CM | POA: Insufficient documentation

## 2019-05-20 DIAGNOSIS — I10 Essential (primary) hypertension: Secondary | ICD-10-CM | POA: Diagnosis not present

## 2019-05-20 DIAGNOSIS — Z79899 Other long term (current) drug therapy: Secondary | ICD-10-CM | POA: Insufficient documentation

## 2019-05-20 DIAGNOSIS — Z7984 Long term (current) use of oral hypoglycemic drugs: Secondary | ICD-10-CM | POA: Insufficient documentation

## 2019-05-20 NOTE — Progress Notes (Signed)
RT changed pt. Trache as per MD order. Pt. Trach changed to 6 shiley xlt proximal cuffless. Pt. Tolerated the procedure well with no issues. Pt. States she feels much better. Resident at bedside with RTx2. Pt. sats 97% on room air.

## 2019-05-20 NOTE — ED Provider Notes (Signed)
[facility] EMERGENCY DEPARTMENT Provider Note   CSN: [id number] Arrival date & time: 05/20/19  1615     History   Chief Complaint No chief complaint on file.   HPI [name] is a 43 y.o. female with a past medical history of mobid obesity, DM, Cushing's disease, tracheostomy who presents to the emergency department with shortness of breath. Patient reports several hours ago she started feeling more short of breath and having more trouble speaking in full sentences. Patient reports she feels like mucus is obstructing her trach but she tried suctioning at home without relief. Patient reports she feels like her trach need to be changed and that she commonly has to have her trach changed more often during change of seasons due to increased mucus. Patient denies fever or known sick contacts. Patient reports she had a similar presentation to the ED last week however they did not change her trach and she has been unable to get in to trach clinic.    The history is provided by the patient and a parent.    Past Medical History:  Diagnosis Date  . Anxiety   . Complication of anesthesia    Pt. states takes long time to wake up from it.   . Cushing's disease ([place])   . Depression   . Diabetes ([place])   . Gastroparesis   . Hyperlipidemia   . Hyperlipidemia   . Hypertension   . Intentional drug overdose ([place])   . Morbid obesity ([place])   . Osteoporosis 07/19/2015  . Other cervical disc degeneration, unspecified cervical region   . Periprosthetic fracture 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[place]), R tibial plateau  07/18/2015  . Sleep apnea   . Uncontrolled diabetes mellitus with diabetic neuropathy, with long-term current use of insulin ([place]) 07/16/2015  . Vitamin D deficiency 07/19/2015    Patient Active Problem List   Diagnosis Date Noted  . Tracheostomy care ([place])   . Pyelonephritis 07/16/2018  . UTI (urinary tract infection) 07/14/2018  . Sepsis secondary to UTI ([place])  07/05/2018  . AKI (acute kidney injury) ([place]) 07/05/2018  . Allergic reaction caused by a drug 07/05/2018  . Tracheostomy complication ([place])   . Tracheostomy dependence ([place])   . OSA (obstructive sleep apnea)   . Depression   . Acute respiratory failure with hypoxemia ([place])   . SOB (shortness of breath)   . Tracheostomy status ([place])   . Hoarse voice quality   . Hypomagnesemia   . Shortness of breath   . Major depressive disorder, recurrent episode, severe ([place])   . Intentional overdose of drug in tablet form ([place]) 11/20/2017  . Intentional drug overdose ([place])   . Seizure ([place])   . Acute respiratory failure with hypercapnia ([place])   . Status epilepticus ([place])   . Spondylosis without myelopathy or radiculopathy, cervical region 10/29/2017  . Abdominal pain, epigastric   . LUQ abdominal pain   . Nausea and vomiting   . Gastroparesis   . Hypertriglyceridemia 07/22/2015  . Diabetes mellitus due to underlying condition, uncontrolled, with diabetic neuropathy, with long-term current use of insulin ([place])   . Vitamin D deficiency 07/19/2015  . 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 Osteoporosis 07/19/2015  . Periprosthetic fracture 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[place]), R tibial plateau  07/18/2015  . Fracture, tibial plateau 07/16/2015  . Uncontrolled diabetes mellitus with diabetic neuropathy, with long-term current use of insulin ([place]) 07/16/2015  . Leukocytosis 07/16/2015  .  Hypertension associated with diabetes ([place]) 07/16/2015  . Obesity hypoventilation syndrome ([place]) 04/20/2014  . Hyperlipidemia associated with type 2 diabetes mellitus ([place]) 07/31/2012  . Cushing disease ([place]) 07/31/2012  . Hypothyroid 07/31/2012    Past Surgical History:  Procedure Laterality Date  . ANTERIOR TALOFIBULAR LIGAMENT REPAIR Left 11/15/2014   Procedure: ANTERIOR TALOFIBULAR LIGAMENT REPAIR;  Surgeon: Jana Half, DPM;  Location: [facility];  Service: Podiatry;  Laterality: Left;  . CESAREAN SECTION  dec 1997/  06-03-2001/   01-01-2005   BILATERAL TUBAL LIGATION WITH LAST ONE  . DILATION AND CURETTAGE OF UTERUS  1995   WITH SUCTION  . DIRECT LARYNGOSCOPY N/A 12/18/2017   Procedure: DIRECT LARYNGOSCOPY;  Surgeon: Izora Gala, MD;  Location: [facility];  Service: ENT;  Laterality: N/A;  . ESOPHAGOGASTRODUODENOSCOPY (EGD) WITH PROPOFOL N/A 10/04/2016   Procedure: ESOPHAGOGASTRODUODENOSCOPY (EGD) WITH PROPOFOL;  Surgeon: [name], MD;  Location: [facility];  Service: Endoscopy;  Laterality: N/A;  . LAPAROSCOPIC CHOLECYSTECTOMY  09-25-2005  . ORIF TIBIA PLATEAU Right 07/19/2015   Procedure: OPEN REDUCTION INTERNAL FIXATION (ORIF) RIGHT TIBIAL PLATEAU;  Surgeon: Altamese Denison, MD;  Location: [facility];  Service: Orthopedics;  Laterality: Right;  . PARTIAL KNEE ARTHROPLASTY Right 04/19/2014   Procedure: RIGHT UNI KNEE ARTHROPLASTY MEDIALLY ;  Surgeon: Mauri Pole, MD;  Location: [facility];  Service: Orthopedics;  Laterality: Right;  . TRACHEOSTOMY TUBE PLACEMENT N/A 11/28/2017   Procedure: TRACHEOSTOMY;  Surgeon: Izora Gala, MD;  Location: [facility];  Service: ENT;  Laterality: N/A;  . TUBAL LIGATION       OB History   No obstetric history on file.      Home Medications    Prior to Admission medications   Medication Sig Start Date End Date Taking? Authorizing Provider  atorvastatin (LIPITOR) 40 MG tablet Take 40 mg by mouth at bedtime.    Yes [provider]  cariprazine (VRAYLAR) capsule Take 3 mg by mouth daily.   Yes [provider]  cholestyramine (QUESTRAN) 4 g packet Take 1 packet (4 g total) by mouth every other day. 04/28/19  Yes [name], MD  gabapentin (NEURONTIN) 600 MG tablet Take 600 mg by mouth 2 (two) times daily.    Yes [provider]  hydrOXYzine (VISTARIL) 50 MG capsule Take 50 mg by mouth at bedtime.   Yes [provider]  insulin regular human CONCENTRATED  (HUMULIN R) 500 UNIT/ML kwikpen Inject 100-120 Units into the skin See admin instructions. Inject 120 units into the skin with breakfast and 100 units with dinner 04/20/19  Yes [provider]  levothyroxine (SYNTHROID, LEVOTHROID) 125 MCG tablet Take 125 mcg by mouth daily before breakfast.   Yes [provider]  liraglutide (VICTOZA) 18 MG/3ML SOPN Inject 1.8 mg into the skin daily after breakfast. Dx Code: E11.649 12/31/18  Yes [provider]  metFORMIN (GLUCOPHAGE) 1000 MG tablet Take 1,000 mg by mouth 2 (two) times daily with a meal.   Yes [provider]  pantoprazole (PROTONIX) 40 MG tablet TAKE ONE TABLET BY MOUTH TWICE A DAY BEFORE MEALS Patient taking differently: Take 40 mg by mouth daily before breakfast.  04/01/17  Yes [name], MD  traZODone (DESYREL) 100 MG tablet Take 100 mg by mouth at bedtime.    Yes [provider]    Family History Family History  Adopted: Yes  Problem Relation Age of Onset  . Autism Son   . ADD / ADHD Son   .  Apraxia Son     Social History Social History   Tobacco Use  . Smoking status: Former Smoker    Years: 4.00    Quit date: 05/22/2017    Years since quitting: 1.9  . Smokeless tobacco: Never Used  Substance Use Topics  . Alcohol use: Yes    Comment: RARE  . Drug use: No    Types: Marijuana     Allergies   Bactrim [sulfamethoxazole-trimethoprim], Penicillins, and Sulfa antibiotics   Review of Systems Review of Systems  Constitutional: Negative for fever.  HENT: Positive for congestion. Negative for trouble swallowing.   Respiratory: Positive for cough and shortness of breath.   Cardiovascular: Negative for chest pain and leg swelling.  Gastrointestinal: Negative for abdominal pain, diarrhea, nausea and vomiting.  Genitourinary: Negative for dysuria.  Skin: Negative for rash.  Neurological: Negative for syncope, speech difficulty, weakness, numbness and headaches.   Psychiatric/Behavioral: Negative for confusion.     Physical Exam Updated Vital Signs BP (!) 157/48   Pulse (!) 111   Temp 98.8 F (37.1 C) (Oral)   Resp 18   SpO2 97%   Physical Exam Constitutional:      General: She is not in acute distress.    Appearance: She is obese.  HENT:     Head: Normocephalic and atraumatic.     Right Ear: External ear normal.     Left Ear: External ear normal.     Mouth/Throat:     Mouth: Mucous membranes are moist.     Pharynx: Oropharynx is clear.  Eyes:     Pupils: Pupils are equal, round, and reactive to light.  Neck:     Musculoskeletal: Neck supple.     Comments: Trach in place Cardiovascular:     Rate and Rhythm: Regular rhythm. Tachycardia present.     Pulses: Normal pulses.  Pulmonary:     Effort: Pulmonary effort is normal. No respiratory distress.     Breath sounds: No wheezing, rhonchi or rales.     Comments: Occasional coarse upper airway sounds Chest:     Chest wall: No tenderness.  Abdominal:     Palpations: Abdomen is soft.     Tenderness: There is no abdominal tenderness. There is no guarding.  Musculoskeletal:     Right lower leg: No edema.     Left lower leg: No edema.  Skin:    General: Skin is warm and dry.  Neurological:     General: No focal deficit present.     Mental Status: She is alert and oriented to person, place, and time.     Cranial Nerves: No cranial nerve deficit.     Sensory: No sensory deficit.     Motor: No weakness.      ED Treatments / Results  Labs (all labs ordered are listed, but only abnormal results are displayed) Labs Reviewed - No data to display  EKG None  Radiology Dg Chest Portable 1 View  Result Date: 05/20/2019 CLINICAL DATA:  Shortness of breath EXAM: PORTABLE CHEST 1 VIEW COMPARISON:  05/12/2019 FINDINGS: Endotracheal tube projects over the mid trachea. Lungs are clear. Heart is borderline in size. No effusions. No acute bony abnormality. IMPRESSION: No active disease.  Electronically Signed   By: Rolm Baptise M.D.   On: 05/20/2019 22:09    Procedures Procedures (including critical care time)  Medications Ordered in ED Medications - No data to display   Initial Impression / Assessment and Plan / ED Course  I  have reviewed the triage vital signs and the nursing notes.  Pertinent labs & imaging results that were available during my care of the patient were reviewed by me and considered in my medical decision making (see chart for details).       Concern for mucus in airway with partical trach obstruction. Patient able to speak in short sentences and has no increased work of breathing and SpO2 in high 90s on exam. Chest xray obtained which did not show evidence of pneumonia. RT attempted suction with little output. Trach exchanged in conjunction with RTs. Patient reports feeling much improved after trach exchange. Thick mucus noted on end of removed trach. Case management met with patient to help plug patient back in with trach clinic. All questions answered and strict return precautions given. Patient comfortable with plan to discharge home and follow up in trach clinic as needed.  Patient seen and plan discussed with Dr. Tyrone Nine.  Final Clinical Impressions(s) / ED Diagnoses   Final diagnoses:  Tracheostomy care [facility])  Shortness of breath    ED Discharge Orders    None       Betsey Amen, MD 05/21/19 [place], [place], DO 05/23/19 [phone]

## 2019-05-20 NOTE — Care Management (Signed)
ED CM received consult concerning patient having issues with getting to the [facility] for management of trach.  Patient reports that she is not given an appointment until day before and is told she can go to the ED as per patient.  CM will send a referral tomorrow to the [facility]. Patient confirms she does not have transportation barriers. Patient verbalized understanding and is agreeable with plan. CM updated EDP and Lenice Pressman on [place]. Lurline Idol will changed by EDP tonight.

## 2019-05-21 ENCOUNTER — Ambulatory Visit: Payer: Medicaid Other | Admitting: Gastroenterology

## 2019-06-01 ENCOUNTER — Encounter: Payer: Self-pay | Admitting: Internal Medicine

## 2019-06-04 ENCOUNTER — Ambulatory Visit: Payer: Self-pay | Admitting: General Surgery

## 2019-06-04 NOTE — H&P (Signed)
History of Present Illness [name] Ok MD; 06/04/2019 10:41 AM) The patient is a 43 year old female who presents with a thyroid nodule. Referred by: Dr. Nelva Bush Chief Complaint: Right thyroid nodule  Patient is a 43 year old female, with a history of diabetes, obesity, recent tracheostomy secondary to obesity, hyperlipidemia, he comes in with a right thyroid nodule. Patient was recently undergoing imaging of her neck secondary to neck pain by Dr. Nelva Bush. This resulted in the finding of a right thyroid nodule. This was subsequently worked up. Patient underwent an ultrasound. Ultrasound revealed a 2.1 cm right thyroid nodule. This was subsequently biopsied. Biopsy resulted in "atypia of undetermined significance or follicular lesion of undetermined significance Bethesda category 3 ". I did review the ultrasound independently as well as the pathology.  Patient was referred here for further management.  Patient states that she's had significant no significant symptoms she is currently on thyroxine.  Patient recently underwent tracheostomy 2017 secondary to OD which required tracheostomy. Patient states that she follows up with the [facility] with the pulmonary group. She states that the plan on leaving this in secondary to Covid at this time.    Past Surgical History Emeline Gins, CMA; 06/04/2019 10:17 AM) Knee Surgery  Right.  Diagnostic Studies History Emeline Gins, [place]; 06/04/2019 10:17 AM) Colonoscopy  never  Allergies Emeline Gins, CMA; 06/04/2019 10:33 AM) Penicillins  Sulfa Antibiotics  Bactrim *ANTI-INFECTIVE AGENTS - MISC.*  Allergies Reconciled   Medication History Emeline Gins, CMA; 06/04/2019 10:22 AM) Atorvastatin Calcium (40MG  Tablet, Oral) Active. Vraylar (3MG  Capsule, Oral) Active. Gabapentin (600MG  Tablet, Oral) Active. hydrOXYzine HCl (50MG  Tablet, Oral) Active. Levothyroxine Sodium (125MCG Capsule, Oral) Active. metFORMIN HCl ER (OSM)  (1000MG  Tablet ER 24HR, Oral) Active. traZODone HCl (150MG  Tablet, Oral) Active. Victoza (18MG /3ML Soln Pen-inj, Subcutaneous) Active. lamoTRIgine (25MG  Tablet, Oral) Active. Pantoprazole Sodium (40MG  Tablet DR, Oral) Active. Topiramate (25MG  Tablet, Oral) Active. traMADol HCl (50MG  Tablet, Oral) Active. Celecoxib (200MG  Capsule, Oral) Active. Medications Reconciled  Social History Emeline Gins, [place]; 06/04/2019 10:17 AM) Alcohol use  Occasional alcohol use. Caffeine use  Carbonated beverages. No drug use  Tobacco use  Former smoker.  Pregnancy / Birth History Emeline Gins, [place]; 06/04/2019 10:17 AM) Age at menarche  54 years. Gravida  3 Irregular periods  Maternal age  76-30 Para  3  Other Problems Emeline Gins, [place]; 06/04/2019 10:17 AM) Back Pain  Diabetes Mellitus  Hypercholesterolemia  Sleep Apnea     Review of Systems [name] Ok MD; 06/04/2019 10:38 AM) General Not Present- Appetite Loss, Chills, Fatigue, Fever, Night Sweats, Weight Gain and Weight Loss. Skin Present- Rash. Not Present- Change in Wart/Mole, Dryness, Hives, Jaundice, New Lesions, Non-Healing Wounds and Ulcer. HEENT Present- Ringing in the Ears. Not Present- Earache, Hearing Loss, Hoarseness, Nose Bleed, Oral Ulcers, Seasonal Allergies, Sinus Pain, Sore Throat, Visual Disturbances, Wears glasses/contact lenses and Yellow Eyes. Respiratory Not Present- Bloody sputum, Chronic Cough, Difficulty Breathing, Snoring and Wheezing. Breast Not Present- Breast Mass, Breast Pain, Nipple Discharge and Skin Changes. Cardiovascular Present- Rapid Heart Rate and Shortness of Breath. Not Present- Chest Pain, Difficulty Breathing Lying Down, Leg Cramps, Palpitations and Swelling of Extremities. Gastrointestinal Not Present- Abdominal Pain, Bloating, Bloody Stool, Change in Bowel Habits, Chronic diarrhea, Constipation, Difficulty Swallowing, Excessive gas, Gets full quickly at meals,  Hemorrhoids, Indigestion, Nausea, Rectal Pain and Vomiting. Female Genitourinary Not Present- Frequency, Nocturia, Painful Urination, Pelvic Pain and Urgency. Musculoskeletal Present- Back Pain, Joint Pain and Joint Stiffness. Not Present- Muscle Pain, Muscle Weakness and Swelling of  Extremities. Neurological Present- Headaches. Not Present- Decreased Memory, Fainting, Numbness, Seizures, Tingling, Tremor, Trouble walking and Weakness. Psychiatric Not Present- Anxiety, Bipolar, Change in Sleep Pattern, Depression, Fearful and Frequent crying. Endocrine Present- Hair Changes. Not Present- Cold Intolerance, Excessive Hunger, Heat Intolerance, Hot flashes and New Diabetes. Hematology Not Present- Blood Thinners, Easy Bruising, Excessive bleeding, Gland problems, HIV and Persistent Infections. All other systems negative  Vitals Emeline Gins CMA; 06/04/2019 10:17 AM) 06/04/2019 10:17 AM Weight: 280 lb Height: 59in Body Surface Area: 2.13 m Body Mass Index: 56.55 kg/m  Temp.: 97.36F  Pulse: 130 (Regular)  BP: 108/78(Sitting, Left Arm, Standard)       Physical Exam [name] Ok MD; 06/04/2019 10:42 AM) The physical exam findings are as follows: Note: Constitutional: No acute distress, conversant, appears stated age  Eyes: Anicteric sclerae, moist conjunctiva, no lid lag  Neck: No thyromegaly, trachea midline, no cervical lymphadenopathy, tracheostomy in place, no palpable masses, enlarged neck.  Lungs: Clear to auscultation biilaterally, normal respiratory effot  Cardiovascular: regular rate & rhythm, no murmurs, no peripheal edema, pedal pulses 2+  GI: Soft, no masses or hepatosplenomegaly, non-tender to palpation  MSK: Normal gait, no clubbing cyanosis, edema  Skin: No rashes, palpation reveals normal skin turgor  Psychiatric: Appropriate judgment and insight, oriented to person, place, and time    Assessment & Plan [name] Ok MD; 06/04/2019 10:43  AM)  THYROID NODULE (E04.1) Impression: Patient is a 43 year old female, with a history of diabetes, obesity, recent tracheostomy secondary to obesity, hyperlipidemia, he comes in with a right thyroid nodule. I long discussion with the patient in regards to her pathology and the 10-30% chance of possible cancer risk based on her biopsy. I discussed the next up with likely be right thyroidectomy.  1. We will obtain pulmonary clearance prior to scheduling surgery. 2. We will proceed to the operating for right thyroidectomy. 3. All risks and benefits were discussed with the patient to generally include: infection, bleeding, damage to the recurrent laryngeal nerve, damage to parathyroid glands, and possible need for further surgery. Alternatives were offered and described. All questions were answered and the patient voiced understanding of the procedure and wishes to proceed.

## 2019-06-08 ENCOUNTER — Other Ambulatory Visit (HOSPITAL_COMMUNITY)
Admission: RE | Admit: 2019-06-08 | Discharge: 2019-06-08 | Disposition: A | Discharge status: Home / Self Care | Payer: Medicaid Other | Source: Ambulatory Visit | Attending: Acute Care | Admitting: Acute Care

## 2019-06-08 DIAGNOSIS — Z01812 Encounter for preprocedural laboratory examination: Secondary | ICD-10-CM | POA: Diagnosis not present

## 2019-06-08 DIAGNOSIS — Z20828 Contact with and (suspected) exposure to other viral communicable diseases: Secondary | ICD-10-CM | POA: Insufficient documentation

## 2019-06-09 LAB — NOVEL CORONAVIRUS, NAA (HOSP ORDER, SEND-OUT TO REF LAB; TAT 18-24 HRS): SARS-CoV-2, NAA: NOT DETECTED

## 2019-06-10 ENCOUNTER — Encounter: Payer: Self-pay | Admitting: Gastroenterology

## 2019-06-10 ENCOUNTER — Telehealth: Payer: Self-pay

## 2019-06-10 ENCOUNTER — Ambulatory Visit: Payer: Medicaid Other | Admitting: Gastroenterology

## 2019-06-10 ENCOUNTER — Other Ambulatory Visit: Payer: Self-pay

## 2019-06-10 VITALS — BP 116/68 | HR 80 | Temp 97.5°F | Ht 62.0 in | Wt 279.1 lb

## 2019-06-10 DIAGNOSIS — K529 Noninfective gastroenteritis and colitis, unspecified: Secondary | ICD-10-CM | POA: Insufficient documentation

## 2019-06-10 MED ORDER — CHOLESTYRAMINE 4 G PO PACK: 4.0000 g | PACK | Freq: Every day | ORAL | 2 refills | Status: AC

## 2019-06-10 NOTE — Patient Instructions (Signed)
If you are age 44 or older, your body mass index should be between 23-30. Your Body mass index is 51.05 kg/m. If this is out of the aforementioned range listed, please consider follow up with your Primary Care Provider.  If you are age 37 or younger, your body mass index should be between 19-25. Your Body mass index is 51.05 kg/m. If this is out of the aformentioned range listed, please consider follow up with your Primary Care Provider.   We have sent the following medications to your pharmacy for you to pick up at your convenience: Questran - take 1/2 pack every other day.  We will contact you regarding a colonoscopy at [facility] after talking with Dr. Ardis Hughs.  Thank you for choosing me and [facility].   Alonza Bogus, PA-C

## 2019-06-10 NOTE — Progress Notes (Signed)
I agree with the above note, plan.  [name], She needs hospital colonoscopy given BMI >50 and trach.  My next available Thursday spot, I know it will be in early November sometime and that is OK.  Thanks

## 2019-06-10 NOTE — Telephone Encounter (Signed)
-----   Message from [name], MD sent at 06/10/2019  3:39 PM EDT -----   ----- Message ----- From: Loralie Champagne, PA-C Sent: 06/10/2019   1:31 PM EDT To: [name], MD

## 2019-06-10 NOTE — Progress Notes (Signed)
06/10/2019 [name] [id number] 1975/11/28   HISTORY OF PRESENT ILLNESS:  This is a 43 year old female who is a patient of Dr. Ardis Hughs.  She was seen in 2016 for complaints of diarrhea that was presumed to be bile salt related since it began after her cholecystectomy, and he prescribed Questran.  She has had a hard time regulating it because even if she takes 1/2 pack daily it seems to make her constipated, but if she does not take it at all then she has diarrhea all the time.  She is very frustrated with this issue at this point since she has issues with accidents and incontinence.  He had mentioned that if she failed to have adequate improvement previously that she may need colonoscopy and she would like to proceed with that at this time.  Denies rectal bleeding but mentions that she has hemorrhoids that are irritated so she has been using preparation H on them.   Past Medical History:  Diagnosis Date  . Anxiety   . Complication of anesthesia    Pt. states takes long time to wake up from it.   . Cushing's disease ([place])   . Depression   . Diabetes ([place])   . Gastroparesis   . Hyperlipidemia   . Hyperlipidemia   . Hypertension   . Intentional drug overdose ([place])   . Morbid obesity ([place])   . Osteoporosis 07/19/2015  . Other cervical disc degeneration, unspecified cervical region   . Periprosthetic fracture 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[place]), R tibial plateau  07/18/2015  . Sleep apnea   . Uncontrolled diabetes mellitus with diabetic neuropathy, with long-term current use of insulin ([place]) 07/16/2015  . Vitamin D deficiency 07/19/2015   Past Surgical History:  Procedure Laterality Date  . ANTERIOR TALOFIBULAR LIGAMENT REPAIR Left 11/15/2014   Procedure: ANTERIOR TALOFIBULAR LIGAMENT REPAIR;  Surgeon: Jana Half, DPM;  Location: [facility];  Service: Podiatry;  Laterality: Left;  . CESAREAN SECTION  dec 1997/  06-03-2001/   01-01-2005   BILATERAL TUBAL  LIGATION WITH LAST ONE  . DILATION AND CURETTAGE OF UTERUS  1995   WITH SUCTION  . DIRECT LARYNGOSCOPY N/A 12/18/2017   Procedure: DIRECT LARYNGOSCOPY;  Surgeon: Izora Gala, MD;  Location: [facility];  Service: ENT;  Laterality: N/A;  . ESOPHAGOGASTRODUODENOSCOPY (EGD) WITH PROPOFOL N/A 10/04/2016   Procedure: ESOPHAGOGASTRODUODENOSCOPY (EGD) WITH PROPOFOL;  Surgeon: [name], MD;  [facility];  Service: Endoscopy;  Laterality: N/A;  . LAPAROSCOPIC CHOLECYSTECTOMY  09-25-2005  . ORIF TIBIA PLATEAU Right 07/19/2015   Procedure: OPEN REDUCTION INTERNAL FIXATION (ORIF) RIGHT TIBIAL PLATEAU;  Surgeon: Altamese Colbert, MD;  Location: [facility];  Service: Orthopedics;  Laterality: Right;  . PARTIAL KNEE ARTHROPLASTY Right 04/19/2014   Procedure: RIGHT UNI KNEE ARTHROPLASTY MEDIALLY ;  Surgeon: Mauri Pole, MD;  Location[facility];  Service: Orthopedics;  Laterality: Right;  . TRACHEOSTOMY TUBE PLACEMENT N/A 11/28/2017   Procedure: TRACHEOSTOMY;  Surgeon: Izora Gala, MD;  Location: [facility];  Service: ENT;  Laterality: N/A;  . TUBAL LIGATION      reports that she quit smoking about 2 years ago. She quit after 4.00 years of use. She has never used smokeless tobacco. She reports current alcohol use. She reports that she does not use drugs. family history includes ADD / ADHD in her son; Apraxia in her son; Autism in her son. She was adopted. Allergies  Allergen Reactions  . Bactrim [Sulfamethoxazole-Trimethoprim] Rash  .  Penicillins Hives and Other (See Comments)    Tolerated zosyn 12/12/17 Has patient had a PCN reaction causing immediate rash, facial/tongue/throat swelling, SOB or lightheadedness with hypotension: No Has patient had a PCN reaction causing severe rash involving mucus membranes or skin necrosis: Yes Has patient had a PCN reaction that required hospitalization: Yes Has patient had a PCN reaction occurring within the last 10 years: No If all of the above answers are "NO", then may  proceed with Cephalosporin use.  . Sulfa Antibiotics Rash      Outpatient Encounter Medications as of 06/10/2019  Medication Sig  . atorvastatin (LIPITOR) 40 MG tablet Take 40 mg by mouth at bedtime.   Marland Kitchen azelastine (ASTELIN) 0.1 % nasal spray Place 1 spray into both nostrils as needed for rhinitis. Use in each nostril as directed  . cariprazine (VRAYLAR) capsule Take 3 mg by mouth daily.  . celecoxib (CELEBREX) 200 MG capsule Take 200 mg by mouth 2 (two) times daily as needed.  . cholestyramine (QUESTRAN) 4 g packet Take 1 packet (4 g total) by mouth every other day.  . fluticasone (FLONASE) 50 MCG/ACT nasal spray Place 1 spray into both nostrils as needed for allergies or rhinitis.  Marland Kitchen gabapentin (NEURONTIN) 600 MG tablet Take 600 mg by mouth 2 (two) times daily.   . hydrOXYzine (VISTARIL) 50 MG capsule Take 50 mg by mouth at bedtime.  . insulin regular human CONCENTRATED (HUMULIN R) 500 UNIT/ML kwikpen Inject 100-120 Units into the skin See admin instructions. Inject 120 units into the skin with breakfast and 100 units with dinner  . lamoTRIgine (LAMICTAL) 25 MG tablet Take 25 mg by mouth daily.  Marland Kitchen levothyroxine (SYNTHROID, LEVOTHROID) 125 MCG tablet Take 125 mcg by mouth daily before breakfast.  . liraglutide (VICTOZA) 18 MG/3ML SOPN Inject 1.8 mg into the skin daily after breakfast. Dx Code: [id number]  . metFORMIN (GLUCOPHAGE) 1000 MG tablet Take 1,000 mg by mouth 2 (two) times daily with a meal.  . pantoprazole (PROTONIX) 40 MG tablet TAKE ONE TABLET BY MOUTH TWICE A DAY BEFORE MEALS (Patient taking differently: Take 40 mg by mouth daily before breakfast. )  . topiramate (TOPAMAX) 25 MG tablet Take 25 mg by mouth 2 (two) times daily.  . traMADol (ULTRAM) 50 MG tablet Take 50 mg by mouth every 6 (six) hours as needed.  . traZODone (DESYREL) 100 MG tablet Take 100 mg by mouth at bedtime.    No facility-administered encounter medications on file as of 06/10/2019.      REVIEW OF SYSTEMS  :  All other systems reviewed and negative except where noted in the History of Present Illness.   PHYSICAL EXAM: BP 116/68   Pulse 80   Temp (!) 97.5 F (36.4 C)   Ht 5\' 2"  (1.575 m)   Wt 279 lb 1.6 oz (126.6 kg)   BMI 51.05 kg/m  General: Well developed white female in no acute distress Head: Normocephalic and atraumatic Eyes:  Sclerae anicteric, conjunctiva pink. Ears: Normal auditory acuity Neck:  Tracheostomy noted. Lungs: Clear throughout to auscultation; no increased WOB. Heart: Regular rate and rhythm; no M/R/G. Abdomen: Soft, obese with large pannus.  BS present.  Mild diffuse TTP. Rectal:  Will be done at the time of colonoscopy. Musculoskeletal: Symmetrical with no gross deformities  Skin: No lesions on visible extremities Extremities: No edema  Neurological: Alert oriented x 4, grossly non-focal Psychological:  Alert and cooperative. Normal mood and affect  ASSESSMENT AND PLAN: *Chronic diarrhea: Was seen  in 2016 by Dr. Ardis Hughs and prescribed Questran.  She has had a hard time regulating it because even if she takes 1/2 pack daily it seems to make her constipated, but if she does not take it at all then she has diarrhea all the time.  We talked about taking 1/2 pack every other day.  Could consider adding a daily powder fiber supplement into her regimen as well to help her go plus bulk the stools.  She is been really struggling with this issue.  He had mentioned that if she failed to have adequate improvement previously that she may need colonoscopy and she would like to proceed with that at this time to rule out anything else that could be causing her issues although it very well may be bile salt related as stated previously.  We can plan for colonoscopy, but this needs to be done at [facility] due to high BMI.  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  Dr. Ardis Hughs please indicate when you could perform colonoscopy for her at the hospital so that we can schedule.    CC:  [facility]

## 2019-06-10 NOTE — Telephone Encounter (Signed)
Author: [name], MD Service: Gastroenterology Author Type: Physician  Filed: 06/10/2019 3:39 PM Encounter Date: 06/10/2019 Status: Signed  Editor: [name], MD (Physician)     Show:Clear all [x] Manual[] Template[] Copied  Added by: [x] [name], MD  [] Hover for details I agree with the above note, plan.  Gunter Conde, She needs hospital colonoscopy given BMI >50 and trach.  My next available Thursday spot, I know it will be in early November sometime and that is OK.  Thanks

## 2019-06-11 ENCOUNTER — Ambulatory Visit (HOSPITAL_COMMUNITY)
Admission: RE | Admit: 2019-06-11 | Discharge: 2019-06-11 | Disposition: A | Discharge status: Home / Self Care | Payer: Medicaid Other | Source: Ambulatory Visit | Attending: Acute Care | Admitting: Acute Care

## 2019-06-11 ENCOUNTER — Other Ambulatory Visit: Payer: Self-pay

## 2019-06-11 DIAGNOSIS — Z4682 Encounter for fitting and adjustment of non-vascular catheter: Secondary | ICD-10-CM | POA: Diagnosis not present

## 2019-06-11 DIAGNOSIS — E669 Obesity, unspecified: Secondary | ICD-10-CM | POA: Insufficient documentation

## 2019-06-11 DIAGNOSIS — G4733 Obstructive sleep apnea (adult) (pediatric): Secondary | ICD-10-CM | POA: Diagnosis not present

## 2019-06-11 DIAGNOSIS — Z01818 Encounter for other preprocedural examination: Secondary | ICD-10-CM | POA: Insufficient documentation

## 2019-06-11 DIAGNOSIS — Z9911 Dependence on respirator [ventilator] status: Secondary | ICD-10-CM | POA: Diagnosis not present

## 2019-06-11 DIAGNOSIS — E079 Disorder of thyroid, unspecified: Secondary | ICD-10-CM | POA: Insufficient documentation

## 2019-06-11 DIAGNOSIS — Z930 Tracheostomy status: Secondary | ICD-10-CM

## 2019-06-11 DIAGNOSIS — K529 Noninfective gastroenteritis and colitis, unspecified: Secondary | ICD-10-CM

## 2019-06-11 MED ORDER — PEG 3350-KCL-NA BICARB-NACL 420 G PO SOLR: 4000.0000 mL | Freq: Once | ORAL | 0 refills | Status: AC

## 2019-06-11 NOTE — Progress Notes (Signed)
[facility]   Reason for visit:  Trach change and pre-op clearance   HPI:  This is a 43 year old white female well known to me w/ sig history of obesity, obstructive sleep apnea and remote subglottal stenosis (since resolved per Dr Janeice Robinson note in June 2019) . We both agree (ENT and myself) that she should remain trach dependent until the point were she has had significant weight loss so that she would be more likely to be successful w/ CPAP therapy.  She has not been able to reach this goal thus far.  I follow her for trach management.  From a trach stand-point she now is s/p trach since march 2019.  -She actually does fairly well from a trach stand-point although she does still require every 3-4 week trach change as she is prone to mucous plugging and is reluctant to suction at home. This at times has resulted in ER visits for trach change.  -She does fairly fell from a functional stand-point. She is ambulatory and although has exertional dyspnea with the trach in place no observed hypoxia. She presents today for both routine trach change and pre-op pulmonary evaluation for possible thyroid surgery.  ROS  Review of Systems - History obtained from the patient General ROS: negative for - fatigue, fever, sleep disturbance, weight gain or weight loss Psychological ROS: negative for - depression ENT ROS: negative for - headaches, nasal congestion, sinus pain, sore throat or vocal changes Allergy and Immunology ROS: negative for - nasal congestion, postnasal drip or seasonal allergies Hematological and Lymphatic ROS: negative for - bleeding problems, blood clots or swollen lymph nodes Endocrine ROS: negative for - palpitations, skin changes or temperature intolerance Respiratory ROS: no cough, shortness of breath, or wheezing Cardiovascular ROS: no chest pain or dyspnea on exertion Gastrointestinal ROS: no abdominal pain, change in bowel habits, or black or bloody  stools Genito-Urinary ROS: negative Musculoskeletal ROS: negative Neurological ROS: no TIA or stroke symptoms  Vital signs:  Reviewed Room air pulse ox 96% even after walking from waiting room to clinic which was approximately 300 feet of ambulation.  Exam:  Physical Exam Constitutional:      General: She is not in acute distress.    Appearance: Normal appearance. She is obese. She is not ill-appearing, toxic-appearing or diaphoretic.  HENT:     Head: Normocephalic.     Nose: Nose normal. No congestion or rhinorrhea.     Mouth/Throat:     Mouth: Mucous membranes are moist.     Pharynx: Oropharynx is clear. No oropharyngeal exudate.  Eyes:     Pupils: Pupils are equal, round, and reactive to light.  Neck:     Musculoskeletal: Normal range of motion and neck supple.     Comments: Trach stoma unremarkable. Excellent phonation w/ PMV and finger occlusion. No air rush when trach PMV removed.  Cardiovascular:     Rate and Rhythm: Normal rate and regular rhythm.     Pulses: Normal pulses.     Heart sounds: No murmur. No gallop.   Pulmonary:     Effort: Pulmonary effort is normal.     Breath sounds: Normal breath sounds.  Abdominal:     General: Bowel sounds are normal.     Palpations: Abdomen is soft.  Musculoskeletal: Normal range of motion.  Skin:    General: Skin is warm and dry.     Capillary Refill: Capillary refill takes less than 2 seconds.  Neurological:  General: No focal deficit present.     Mental Status: She is alert and oriented to person, place, and time. Mental status is at baseline.  Psychiatric:        Mood and Affect: Mood normal.        Behavior: Behavior normal.     Trach change/procedure: the current 6 proximal XLT was removed. The stoma is well matured and w/ only minimal dried tracheal secretions. These were cleaned of and new trach was inserted over stylet w/out difficulty        Impression/dx  Obesity  Obstructive sleep apnea  Tracheostomy  dependence  Thyroid mass Discussion  Assuming [name]'s airway is safely maintained before,during, and after surgery I would rate her pulmonary risk as moderate to high primarily for prolonged ventilatory dependence given her body habitus. Assuming this risk,  and of course the risk of anesthesia related adverse events;  there is no absolute pulmonary contraindication for surgery. I would advise she be monitored over night in the intensive care setting post-operatively given her higher risk of post-operative hypoventilation and need for prolonged ventilation.  Plan/rec  Cleared for surgery from trach clinic stand-point assuming: 1) she spends the initial post-op night in the intensive care 2) she also has anesthesia clearance 3) she continues to accept the risk for prolonged need for mechanical ventilation (at this point she is agreeable to this).  Will defer to anesthesia as to if they would like further in-put from her PCP.   Will see her back in trach clinic in 4 weeks. Please feel free to contact us after surgery if we can be of assistance in her post-operative care.     I will forward this to Dr Rosendo Gros.   Erick Colace ACNP-BC [place] Pager # [phone] OR # [phone] if no answer     Visit time: 45  minutes.   Erick Colace ACNP-BC [place]

## 2019-06-11 NOTE — Progress Notes (Signed)
Tracheostomy Procedure Note  [name] [id number] [birth year]  Pre Procedure Tracheostomy Information  Trach Brand: Shiley Size: 6.0 Style: Uncuffed 6 XLT U proximal Secured by: Velcro   Procedure: Trach change and trach cleaning    Post Procedure Tracheostomy Information  Trach Brand: Shiley Size: 6.0 XLT Proximal  Style: Uncuffed  Secured by: Sutures   Post Procedure Evaluation:  ETCO2 positive color change from yellow to purple : Yes.   Vital signs:blood pressure VSS, pulse 110, respirations 22 and pulse oximetry 97 % Patients current condition: stable Complications: No apparent complications Trach site exam: clean and dry Wound care done: dry Patient did tolerate procedure well.   Education: none  Prescription needs: none    Additional needs: none

## 2019-06-11 NOTE — Telephone Encounter (Signed)
I spoke with the pt and instructed her over the for her colon on 11/12 at [facility] with Dr Ardis Hughs as well as the [place] information on  11/9.  Instructions have been mailed.

## 2019-06-15 ENCOUNTER — Ambulatory Visit: Payer: Self-pay | Admitting: General Surgery

## 2019-06-23 ENCOUNTER — Telehealth: Payer: Self-pay | Admitting: Gastroenterology

## 2019-06-23 NOTE — Telephone Encounter (Signed)
Pt requested to reschedule colon date scheduled 11/12 at [facility].

## 2019-06-23 NOTE — Telephone Encounter (Signed)
The pt states she is having surgery on 11/16.  We discussed that she is able to still have colon on the day scheduled as well.  She has decided to keep appt as planned.

## 2019-07-01 NOTE — H&P (View-Only) (Signed)
Anesthesia Note:   [name] is currently scheduled for right thyroidectomy (for thyroid nodule) by Ralene Ok, MD on 08/28/2019 and has a [facility] PAT visit scheduled for 08/21/2019 at 10:00 AM. SHE WILL NEED ANESTHESIOLOGIST EVALUATION AT HER PAT VISIT. (In the interim, I will communicate with Dr. Rosendo Gros about A1c of 13.1 in August. She did have her insulin adjusted at that time. Fortunately, she is scheduled for endocrinology follow-up on 07/20/19.)  History includes formed smoker, tracheostomy (11/28/17), Cushing's disease, morbid obesity, DM2 (uncontrolled), gastroparesis, HTN, HLD, OSA, anxiety, depression (with history of intentional drug OD).   On 11/20/17 patient brought to ED after intentional OD with Baclofen, Trazodone, and tramadol. She was somnolent on arrival and then suffered a grand mal seizure and was intubated. She was extubated two days later but had "poor voice" afterwards. On 11/26/17 required rapid response with IV steroids and racemic epinephrine for mild stridor. ENT was consulted. She had another episode on 11/28/17 with bedside flexible laryngoscopy showing posterior supraglottic swelling with fixed vocal cords. She was taken to the OR and underwent direct laryngoscopy which showed very swollen arytenoids and entire subglottis circumferentially swollen with necrotic debris and fibrinous exudate. Tracheostomy was performed.  Providers: - PCP is with [facility] - Kathlene Cote, MD is Endocrinologist ([place]). A1c 13.1 on 04/20/19. Insulin increased to 120 units with breakfast and 100 units with dinner.  - Izora Gala, MD is ENT ([place]) - Owens Loffler, MD is GI. She needs future colonoscopy for further evaluation of chronic diarrhea.   Seen by Salvadore Dom, NP at the [facility] on 06/11/19 for trach change (6 proximal XLT) and preoperative evaluation. By notes, pulmonology and ENT agree that she should  remain trach dependent until she has had significant weight loss so that she would be more likely successful with CPAP therapy. She requires a trach change about every 3-4 weeks as she is prone to mucous plugging and is reluctant to suction at home. She is ambulatory and although has exertional dyspnea with the trach in place no observed hypoxia.  Preoperative Pulmonology Assessment: "Assuming Tira's airway is safely maintained before,during, and after surgery I would rate her pulmonary risk as moderate to high primarily for prolonged ventilatory dependence given her body habitus. Assuming this risk,  and of course the risk of anesthesia related adverse events;  there is no absolute pulmonary contraindication for surgery. I would advise she be monitored over night in the intensive care setting post-operatively given her higher risk of post-operative hypoventilation and need for prolonged ventilation.Marland KitchenMarland KitchenCleared for surgery from trach clinic stand-point assuming: 1) she spends the initial post-op night in the intensive care 2) she also has anesthesia clearance 3) she continues to accept the risk for prolonged need for mechanical ventilation (at this point she is agreeable to this).  Will defer to anesthesia as to if they would like further in-put from her PCP."    Thyroid US 04/23/19: FINDINGS: Parenchymal Echotexture: Mildly heterogenous Isthmus: 4 mm Right lobe: 4.4 x 2.0 x 2.1 cm Left lobe: 3.4 x 1.3 x 2.1 cm - FNA right thyroid nodule on 05/07/19: Atypia of undetermined significance or follicular lesion of undetermined significance (Bethesda Category III).   1V PCXR 05/20/19: FINDINGS: Endotracheal tube projects over the mid trachea. Lungs are clear. Heart is borderline in size. No effusions. No acute bony abnormality. IMPRESSION: No active disease.   EKG 05/20/19: ST at 118 bpm. Low voltage QRS.  Echo 12/22/17: Study Conclusions -  Left ventricle: The cavity size was normal. There was mild    concentric hypertrophy. Systolic function was normal. The   estimated ejection fraction was in the range of 55% to 60%.   Although no diagnostic regional wall motion abnormality was   identified, this possibility cannot be completely excluded on the   basis of this study. Left ventricular diastolic function   parameters were normal.   Myra Gianotti, PA-C Surgical Short Stay/Anesthesiology [facility] Phone [phone] [facility] Phone [phone] 07/01/2019 10:01 AM

## 2019-07-01 NOTE — Progress Notes (Signed)
Anesthesia Note:   [name] is currently scheduled for right thyroidectomy (for thyroid nodule) by Ralene Ok, MD on 08/28/2019 and has a [facility] PAT visit scheduled for 08/21/2019 at 10:00 AM. SHE WILL NEED ANESTHESIOLOGIST EVALUATION AT HER PAT VISIT. (In the interim, I will communicate with Dr. Rosendo Gros about A1c of 13.1 in August. She did have her insulin adjusted at that time. Fortunately, she is scheduled for endocrinology follow-up on 07/20/19.)  History includes formed smoker, tracheostomy (11/28/17), Cushing's disease, morbid obesity, DM2 (uncontrolled), gastroparesis, HTN, HLD, OSA, anxiety, depression (with history of intentional drug OD).   On 11/20/17 patient brought to ED after intentional OD with Baclofen, Trazodone, and tramadol. She was somnolent on arrival and then suffered a grand mal seizure and was intubated. She was extubated two days later but had "poor voice" afterwards. On 11/26/17 required rapid response with IV steroids and racemic epinephrine for mild stridor. ENT was consulted. She had another episode on 11/28/17 with bedside flexible laryngoscopy showing posterior supraglottic swelling with fixed vocal cords. She was taken to the OR and underwent direct laryngoscopy which showed very swollen arytenoids and entire subglottis circumferentially swollen with necrotic debris and fibrinous exudate. Tracheostomy was performed.  Providers: - PCP is with [facility] - Kathlene Cote, MD is Endocrinologist ([place]). A1c 13.1 on 04/20/19. Insulin increased to 120 units with breakfast and 100 units with dinner.  - Izora Gala, MD is ENT ([place]) - Owens Loffler, MD is GI. She needs future colonoscopy for further evaluation of chronic diarrhea.   Seen by Salvadore Dom, NP at the [facility] on 06/11/19 for trach change (6 proximal XLT) and preoperative evaluation. By notes, pulmonology and ENT agree that she should  remain trach dependent until she has had significant weight loss so that she would be more likely successful with CPAP therapy. She requires a trach change about every 3-4 weeks as she is prone to mucous plugging and is reluctant to suction at home. She is ambulatory and although has exertional dyspnea with the trach in place no observed hypoxia.  Preoperative Pulmonology Assessment: "Assuming [name]'s airway is safely maintained before,during, and after surgery I would rate her pulmonary risk as moderate to high primarily for prolonged ventilatory dependence given her body habitus. Assuming this risk,  and of course the risk of anesthesia related adverse events;  there is no absolute pulmonary contraindication for surgery. I would advise she be monitored over night in the intensive care setting post-operatively given her higher risk of post-operative hypoventilation and need for prolonged ventilation.Marland KitchenMarland KitchenCleared for surgery from trach clinic stand-point assuming: 1) she spends the initial post-op night in the intensive care 2) she also has anesthesia clearance 3) she continues to accept the risk for prolonged need for mechanical ventilation (at this point she is agreeable to this).  Will defer to anesthesia as to if they would like further in-put from her PCP."    Thyroid US 04/23/19: FINDINGS: Parenchymal Echotexture: Mildly heterogenous Isthmus: 4 mm Right lobe: 4.4 x 2.0 x 2.1 cm Left lobe: 3.4 x 1.3 x 2.1 cm - FNA right thyroid nodule on 05/07/19: Atypia of undetermined significance or follicular lesion of undetermined significance (Bethesda Category III).   1V PCXR 05/20/19: FINDINGS: Endotracheal tube projects over the mid trachea. Lungs are clear. Heart is borderline in size. No effusions. No acute bony abnormality. IMPRESSION: No active disease.   EKG 05/20/19: ST at 118 bpm. Low voltage QRS.  Echo 12/22/17: Study Conclusions -  Left ventricle: The cavity size was normal. There was mild    concentric hypertrophy. Systolic function was normal. The   estimated ejection fraction was in the range of 55% to 60%.   Although no diagnostic regional wall motion abnormality was   identified, this possibility cannot be completely excluded on the   basis of this study. Left ventricular diastolic function   parameters were normal.   Myra Gianotti, PA-C Surgical Short Stay/Anesthesiology [facility] Phone [phone] [facility] Phone [phone] 07/01/2019 10:01 AM

## 2019-07-20 ENCOUNTER — Other Ambulatory Visit (HOSPITAL_COMMUNITY): Payer: Medicaid Other

## 2019-07-20 ENCOUNTER — Telehealth: Payer: Self-pay | Admitting: Gastroenterology

## 2019-07-20 ENCOUNTER — Other Ambulatory Visit: Payer: Self-pay | Admitting: Gastroenterology

## 2019-07-20 NOTE — Telephone Encounter (Signed)
Spoke with [place] [facility], pt was supposed to go to [facility]. Pt is scheduled for procedure at [facility] 07/23/19. Per Roanna Epley at [facility] path test will be run in the am and results should be back by 07/22/19.

## 2019-07-20 NOTE — Telephone Encounter (Signed)
Pt's husband stated that pt had COVID-19 test done at [facility], but they could not find her in the system and had to fill out a new pt form for her.  Please call [place] to clarify.

## 2019-07-21 LAB — SARS CORONAVIRUS 2 (TAT 6-24 HRS): SARS Coronavirus 2: NEGATIVE

## 2019-07-21 SURGERY — Surgical Case
Anesthesia: *Unknown

## 2019-07-22 ENCOUNTER — Other Ambulatory Visit: Payer: Self-pay

## 2019-07-22 ENCOUNTER — Encounter (HOSPITAL_COMMUNITY): Payer: Self-pay | Admitting: *Deleted

## 2019-07-23 ENCOUNTER — Ambulatory Visit (HOSPITAL_COMMUNITY): Payer: Medicaid Other | Admitting: Anesthesiology

## 2019-07-23 ENCOUNTER — Other Ambulatory Visit (HOSPITAL_COMMUNITY): Payer: Medicaid Other

## 2019-07-23 ENCOUNTER — Encounter (HOSPITAL_COMMUNITY)
Admission: RE | Disposition: A | Discharge status: Home / Self Care | Payer: Self-pay | Source: Home / Self Care | Attending: Gastroenterology

## 2019-07-23 ENCOUNTER — Encounter (HOSPITAL_COMMUNITY): Payer: Self-pay | Admitting: *Deleted

## 2019-07-23 ENCOUNTER — Ambulatory Visit (HOSPITAL_COMMUNITY)
Admission: RE | Admit: 2019-07-23 | Discharge: 2019-07-23 | Disposition: A | Discharge status: Home / Self Care | Payer: Medicaid Other | Attending: Gastroenterology | Admitting: Gastroenterology

## 2019-07-23 ENCOUNTER — Telehealth: Payer: Self-pay

## 2019-07-23 DIAGNOSIS — Z87891 Personal history of nicotine dependence: Secondary | ICD-10-CM | POA: Insufficient documentation

## 2019-07-23 DIAGNOSIS — E785 Hyperlipidemia, unspecified: Secondary | ICD-10-CM | POA: Diagnosis not present

## 2019-07-23 DIAGNOSIS — Z794 Long term (current) use of insulin: Secondary | ICD-10-CM | POA: Insufficient documentation

## 2019-07-23 DIAGNOSIS — K529 Noninfective gastroenteritis and colitis, unspecified: Secondary | ICD-10-CM | POA: Diagnosis not present

## 2019-07-23 DIAGNOSIS — F419 Anxiety disorder, unspecified: Secondary | ICD-10-CM | POA: Insufficient documentation

## 2019-07-23 DIAGNOSIS — G4733 Obstructive sleep apnea (adult) (pediatric): Secondary | ICD-10-CM | POA: Insufficient documentation

## 2019-07-23 DIAGNOSIS — F329 Major depressive disorder, single episode, unspecified: Secondary | ICD-10-CM | POA: Diagnosis not present

## 2019-07-23 DIAGNOSIS — E1140 Type 2 diabetes mellitus with diabetic neuropathy, unspecified: Secondary | ICD-10-CM | POA: Diagnosis not present

## 2019-07-23 DIAGNOSIS — Z96651 Presence of right artificial knee joint: Secondary | ICD-10-CM | POA: Diagnosis not present

## 2019-07-23 DIAGNOSIS — E041 Nontoxic single thyroid nodule: Secondary | ICD-10-CM | POA: Diagnosis not present

## 2019-07-23 DIAGNOSIS — Z538 Procedure and treatment not carried out for other reasons: Secondary | ICD-10-CM | POA: Insufficient documentation

## 2019-07-23 DIAGNOSIS — E249 Cushing's syndrome, unspecified: Secondary | ICD-10-CM | POA: Insufficient documentation

## 2019-07-23 DIAGNOSIS — E1143 Type 2 diabetes mellitus with diabetic autonomic (poly)neuropathy: Secondary | ICD-10-CM | POA: Insufficient documentation

## 2019-07-23 DIAGNOSIS — K3184 Gastroparesis: Secondary | ICD-10-CM | POA: Insufficient documentation

## 2019-07-23 DIAGNOSIS — I119 Hypertensive heart disease without heart failure: Secondary | ICD-10-CM | POA: Insufficient documentation

## 2019-07-23 DIAGNOSIS — M810 Age-related osteoporosis without current pathological fracture: Secondary | ICD-10-CM | POA: Insufficient documentation

## 2019-07-23 DIAGNOSIS — Z6841 Body Mass Index (BMI) 40.0 and over, adult: Secondary | ICD-10-CM | POA: Diagnosis not present

## 2019-07-23 DIAGNOSIS — K219 Gastro-esophageal reflux disease without esophagitis: Secondary | ICD-10-CM | POA: Insufficient documentation

## 2019-07-23 DIAGNOSIS — Z930 Tracheostomy status: Secondary | ICD-10-CM | POA: Diagnosis not present

## 2019-07-23 LAB — BASIC METABOLIC PANEL
Anion gap: 11 (ref 5–15)
BUN: 19 mg/dL (ref 6–20)
CO2: 24 mmol/L (ref 22–32)
Calcium: 9.3 mg/dL (ref 8.9–10.3)
Chloride: 98 mmol/L (ref 98–111)
Creatinine, Ser: 0.9 mg/dL (ref 0.44–1.00)
GFR calc Af Amer: >60 mL/min (ref >60)
GFR calc non Af Amer: >60 mL/min (ref >60)
Glucose, Bld: 438 mg/dL — ABNORMAL HIGH (ref 70–99)
Potassium: 4.8 mmol/L (ref 3.5–5.1)
Sodium: 133 mmol/L — ABNORMAL LOW (ref 135–145)

## 2019-07-23 LAB — GLUCOSE, CAPILLARY
Glucose-Capillary: 429 mg/dL — ABNORMAL HIGH (ref 70–99)
Glucose-Capillary: 447 mg/dL — ABNORMAL HIGH (ref 70–99)

## 2019-07-23 SURGERY — CANCELLED PROCEDURE

## 2019-07-23 MED ORDER — SODIUM CHLORIDE 0.9 % IV SOLN: INTRAVENOUS | Status: DC

## 2019-07-23 MED ORDER — PROPOFOL 10 MG/ML IV BOLUS
INTRAVENOUS | Status: AC
  Filled 2019-07-23: qty 40

## 2019-07-23 SURGICAL SUPPLY — 22 items

## 2019-07-23 NOTE — Progress Notes (Signed)
Pt's blood glucose was 447 this am, per Dr. Lanetta Inch and Dr Ardis Hughs pt's colonoscopy will be canceled today and be rescheduled for another day. Jobe Igo, RN

## 2019-07-23 NOTE — Telephone Encounter (Signed)
-----   Message from [name], MD sent at 07/23/2019  9:23 AM EST ----- Her blood sugar was in the 400s this morning and the case was cancelled by anesthesia (I agree with their decision).  Can you please contact her about rescheduling the colonoscoy at [facility] for about 2 months from now and she needs to get in with her endocrinologist or PCP to adjust her DM meds appropriately.  Advise that she take 1-2 imodium every AM shortly after waking for now.  Thanks

## 2019-07-23 NOTE — Anesthesia Preprocedure Evaluation (Addendum)
Anesthesia Evaluation  Patient identified by MRN, date of birth, ID band Patient awake    Reviewed: Allergy & Precautions, NPO status , Patient's Chart, lab work & pertinent test results  Airway        Dental   Pulmonary shortness of breath, sleep apnea , former smoker,  S/p trach 2019          Cardiovascular hypertension,   HLD  TTE 2019 - Left ventricle: The cavity size was normal. There was mild concentric hypertrophy. Systolic function was normal. The estimated ejection fraction was in the range of 55% to 60%. Although no diagnostic regional wall motion abnormality was identified, this possibility cannot be completely excluded on the basis of this study. Left ventricular diastolic function parameters were normal - no significant valvular abnormalities   Neuro/Psych Anxiety Depression negative neurological ROS  negative psych ROS   GI/Hepatic Neg liver ROS, GERD  Medicated,  Endo/Other  diabetes, Poorly Controlled, Type 2, Insulin DependentHypothyroidism Morbid obesity  Renal/GU negative Renal ROS  negative genitourinary   Musculoskeletal negative musculoskeletal ROS (+)   Abdominal   Peds  Hematology negative hematology ROS (+)   Anesthesia Other Findings   Reproductive/Obstetrics                             Anesthesia Physical Anesthesia Plan  ASA: III  Anesthesia Plan: MAC   Post-op Pain Management:    Induction: Intravenous  PONV Risk Score and Plan: Propofol infusion and Treatment may vary due to age or medical condition  Airway Management Planned: Natural Airway  Additional Equipment:   Intra-op Plan:   Post-operative Plan:   Informed Consent: I have reviewed the patients History and Physical, chart, labs and discussed the procedure including the risks, benefits and alternatives for the proposed anesthesia with the patient or authorized representative who has indicated  his/her understanding and acceptance.     Dental advisory given  Plan Discussed with: CRNA  Anesthesia Plan Comments: (Procedure cancelled 2/2 BG 447)       Anesthesia Quick Evaluation

## 2019-07-23 NOTE — Interval H&P Note (Signed)
History and Physical Interval Note:  07/23/2019 7:10 AM  [name]  has presented today for surgery, with the diagnosis of chronic diarrhea.  The various methods of treatment have been discussed with the patient and family. After consideration of risks, benefits and other options for treatment, the patient has consented to  Procedure(s): COLONOSCOPY WITH PROPOFOL (N/A) as a surgical intervention.  The patient's history has been reviewed, patient examined, no change in status, stable for surgery.  I have reviewed the patient's chart and labs.  Questions were answered to the patient's satisfaction.     [name]    HPI: This is a woman with chronci diarrhea  Chief complaint is chronic diarrhea  ROS: complete GI ROS as described in HPI, all other review negative.  Constitutional:  No unintentional weight loss   Past Medical History:  Diagnosis Date  . Anxiety   . Complication of anesthesia    Pt. states takes long time to wake up from it.   . Cushing's disease ([name])   . Depression   . Diabetes ([name])   . Gastroparesis   . Hyperlipidemia   . Hyperlipidemia   . Hypertension   . Intentional drug overdose ([name])   . Morbid obesity ([name])   . Osteoporosis 07/19/2015  . Other cervical disc degeneration, unspecified cervical region   . Periprosthetic fracture 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[name]), R tibial plateau  07/18/2015  . Sleep apnea    has Trach  . Uncontrolled diabetes mellitus with diabetic neuropathy, with long-term current use of insulin ([name]) 07/16/2015  . Vitamin D deficiency 07/19/2015    Past Surgical History:  Procedure Laterality Date  . ANTERIOR TALOFIBULAR LIGAMENT REPAIR Left 11/15/2014   Procedure: ANTERIOR TALOFIBULAR LIGAMENT REPAIR;  Surgeon: Jana Half, DPM;  Location: [place];  Service: Podiatry;  Laterality: Left;  . CESAREAN SECTION  dec 1997/  06-03-2001/   01-01-2005   BILATERAL TUBAL LIGATION WITH LAST ONE  .  DILATION AND CURETTAGE OF UTERUS  1995   WITH SUCTION  . DIRECT LARYNGOSCOPY N/A 12/18/2017   Procedure: DIRECT LARYNGOSCOPY;  Surgeon: Izora Gala, MD;  Location: [place];  Service: ENT;  Laterality: N/A;  . ESOPHAGOGASTRODUODENOSCOPY (EGD) WITH PROPOFOL N/A 10/04/2016   Procedure: ESOPHAGOGASTRODUODENOSCOPY (EGD) WITH PROPOFOL;  Surgeon: [name] Banister, MD;  Location: [facility];  Service: Endoscopy;  Laterality: N/A;  . LAPAROSCOPIC CHOLECYSTECTOMY  09-25-2005  . ORIF TIBIA PLATEAU Right 07/19/2015   Procedure: OPEN REDUCTION INTERNAL FIXATION (ORIF) RIGHT TIBIAL PLATEAU;  Surgeon: [name] Silas, MD;  Location: [place];  Service: Orthopedics;  Laterality: Right;  . PARTIAL KNEE ARTHROPLASTY Right 04/19/2014   Procedure: RIGHT UNI KNEE ARTHROPLASTY MEDIALLY ;  Surgeon: [name] Pole, MD;  Location: [facility];  Service: Orthopedics;  Laterality: Right;  . TRACHEOSTOMY TUBE PLACEMENT N/A 11/28/2017   Procedure: TRACHEOSTOMY;  Surgeon: Izora Gala, MD;  Location: [place];  Service: ENT;  Laterality: N/A;  . TUBAL LIGATION      Current Facility-Administered Medications  Medication Dose Route Frequency Provider Last Rate Last Dose  . 0.9 %  sodium chloride infusion   Intravenous Continuous [name] Banister, MD        Allergies as of 06/11/2019 - Review Complete 06/10/2019  Allergen Reaction Noted  . Bactrim [sulfamethoxazole-trimethoprim] Rash 07/05/2018  . Penicillins Hives and Other (See Comments) 01/11/2012  . Sulfa antibiotics Rash 05/12/2019    Family History  Adopted: Yes  Problem Relation Age of Onset  .  Autism Son   . ADD / ADHD Son   . Apraxia Son     Social History   Socioeconomic History  . Marital status: Married    Spouse name: [name]  . Number of children: 2  . Years of education: Not on file  . Highest education level: Not on file  Occupational History  . Not on file  Social Needs  . Financial resource strain: Not very hard  . Food insecurity    Worry: Never true     Inability: Never true  . Transportation needs    Medical: No    Non-medical: No  Tobacco Use  . Smoking status: Former Smoker    Years: 4.00    Quit date: 05/22/2017    Years since quitting: 2.1  . Smokeless tobacco: Never Used  Substance and Sexual Activity  . Alcohol use: Yes    Comment: RARE  . Drug use: No    Types: Marijuana  . Sexual activity: Yes    Partners: Male    Birth control/protection: Pill  Lifestyle  . Physical activity    Days per week: Patient refused    Minutes per session: Patient refused  . Stress: To some extent  Relationships  . Social connections    Talks on phone: More than three times a week    Gets together: Twice a week    Attends religious service: More than 4 times per year    Active member of club or organization: Yes    Attends meetings of clubs or organizations: More than 4 times per year    Relationship status: Married  . Intimate partner violence    Fear of current or ex partner: No    Emotionally abused: No    Physically abused: No    Forced sexual activity: No  Other Topics Concern  . Not on file  Social History Narrative  . Not on file     Physical Exam: BP (!) 141/73   Pulse 99   Temp 98.5 F (36.9 C) (Oral)   Resp 14   Ht 5\' 2"  (1.575 m)   Wt 122.5 kg   SpO2 99%   BMI 49.38 kg/m  Constitutional: generally well-appearing Psychiatric: alert and oriented x3 Abdomen: soft, nontender, nondistended, no obvious ascites, no peritoneal signs, normal bowel sounds No peripheral edema noted in lower extremities  Assessment and plan: 43 y.o. female with chronic diarrhea   Massive obesity as well.  Here for colonoscopy today  Please see the "Patient Instructions" section for addition details about the plan.  [name] Loffler, MD [facility] 07/23/2019, 7:12 AM

## 2019-07-23 NOTE — Telephone Encounter (Signed)
The pt will be notified and called in 2 months to set up colon. She has been advised to follow up on her blood sugar and also to take imodium 1-2 every morning

## 2019-07-26 ENCOUNTER — Emergency Department (HOSPITAL_COMMUNITY)
Admission: EM | Admit: 2019-07-26 | Discharge: 2019-07-26 | Disposition: A | Discharge status: Home / Self Care | Payer: Medicaid Other | Attending: Emergency Medicine | Admitting: Emergency Medicine

## 2019-07-26 ENCOUNTER — Other Ambulatory Visit: Payer: Self-pay

## 2019-07-26 ENCOUNTER — Encounter (HOSPITAL_COMMUNITY): Payer: Self-pay

## 2019-07-26 DIAGNOSIS — Z5321 Procedure and treatment not carried out due to patient leaving prior to being seen by health care provider: Secondary | ICD-10-CM | POA: Diagnosis not present

## 2019-07-26 DIAGNOSIS — Z930 Tracheostomy status: Secondary | ICD-10-CM | POA: Diagnosis present

## 2019-07-26 NOTE — ED Notes (Signed)
Pt did not want to wait Pt wanted to come back in the morning.

## 2019-07-26 NOTE — ED Triage Notes (Signed)
Patient complains of trach not feeling right x 2 hours. States that she isnt having trouble breathing but just doesn't feel right when she looks straight or turns head to right. Doesn't think its a plug

## 2019-07-27 ENCOUNTER — Other Ambulatory Visit (HOSPITAL_COMMUNITY)
Admission: RE | Admit: 2019-07-27 | Discharge: 2019-07-27 | Disposition: A | Discharge status: Home / Self Care | Payer: Medicaid Other | Source: Ambulatory Visit | Attending: Acute Care | Admitting: Acute Care

## 2019-07-27 DIAGNOSIS — Z20828 Contact with and (suspected) exposure to other viral communicable diseases: Secondary | ICD-10-CM | POA: Insufficient documentation

## 2019-07-27 DIAGNOSIS — Z01812 Encounter for preprocedural laboratory examination: Secondary | ICD-10-CM | POA: Insufficient documentation

## 2019-07-27 LAB — SARS CORONAVIRUS 2 (TAT 6-24 HRS): SARS Coronavirus 2: NEGATIVE

## 2019-07-29 ENCOUNTER — Ambulatory Visit (HOSPITAL_COMMUNITY)
Admission: RE | Admit: 2019-07-29 | Discharge: 2019-07-29 | Disposition: A | Discharge status: Home / Self Care | Payer: Medicaid Other | Source: Ambulatory Visit | Attending: Acute Care | Admitting: Acute Care

## 2019-07-29 ENCOUNTER — Other Ambulatory Visit: Payer: Self-pay

## 2019-07-29 DIAGNOSIS — Z930 Tracheostomy status: Secondary | ICD-10-CM | POA: Insufficient documentation

## 2019-07-29 DIAGNOSIS — Z4682 Encounter for fitting and adjustment of non-vascular catheter: Secondary | ICD-10-CM | POA: Diagnosis not present

## 2019-07-29 DIAGNOSIS — E669 Obesity, unspecified: Secondary | ICD-10-CM | POA: Insufficient documentation

## 2019-07-29 DIAGNOSIS — E079 Disorder of thyroid, unspecified: Secondary | ICD-10-CM | POA: Insufficient documentation

## 2019-07-29 DIAGNOSIS — G4733 Obstructive sleep apnea (adult) (pediatric): Secondary | ICD-10-CM | POA: Diagnosis not present

## 2019-07-29 NOTE — Progress Notes (Signed)
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 Presents today for routine trach care. Did return to the ER recently for trach obstruction but did not require trach change. ROS  Review of Systems - History obtained from the patient General ROS: negative for - chills, fatigue, fever, weight gain or weight loss ENT ROS: negative for - headaches or nasal congestion Endocrine ROS: negative for - unexpected weight changes Respiratory ROS: negative for - cough, shortness of breath, sputum changes or wheezing Cardiovascular ROS: no chest pain or dyspnea on exertion Gastrointestinal ROS: no abdominal pain, change in bowel habits, or black or bloody stools Genito-Urinary ROS: no dysuria, trouble voiding, or hematuria Musculoskeletal ROS: negative Neurological ROS: no TIA or stroke symptoms  Vital signs:  HR 102 RR 22 sats 99% room air  Exam:  Physical Exam Vitals signs reviewed.  Constitutional:      General: She is not in acute distress.    Appearance: Normal appearance. She is not ill-appearing or toxic-appearing.  HENT:     Head: Normocephalic.     Comments: Trach stoma unremarkable. Internal cannula of trach thick yellow     Nose: Nose normal. No congestion.     Mouth/Throat:     Mouth: Mucous membranes are moist.     Pharynx: No oropharyngeal exudate.  Eyes:     Extraocular Movements: Extraocular movements intact.     Pupils: Pupils are equal, round, and reactive to light.  Neck:     Musculoskeletal: Normal range of motion.  Cardiovascular:     Rate and Rhythm: Normal rate.  Pulmonary:     Effort: Pulmonary effort is normal.     Breath sounds: Normal breath sounds.  Abdominal:     General: Bowel sounds are normal.     Palpations: Abdomen is soft.  Skin:    General: Skin is warm and dry.     Capillary Refill: Capillary refill takes less  than 2 seconds.  Neurological:     Mental Status: She is alert and oriented to person, place, and time.  Psychiatric:        Mood and Affect: Mood normal.     Trach change/procedure: trach changed w/out difficulty. # 6 prox-XLT shiley       Impression/dx  Trach dependence  Severe OSA Obesity  Thyroid Mass Discussion  Ondrea is doing well from trach stand-point. She does require intermittent unscheduled trach changes in the ER due to her inner cannula getting occluded; but the trach stoma is unremarkable.  Plan  Will try to change planned trach change to q 4 weeks to see if we can prevent ER visits.  Cont routine trach care   Visit time: 33 minutes.   Erick Colace ACNP-BC [place]

## 2019-07-29 NOTE — Progress Notes (Signed)
Tracheostomy Procedure Note  BRIONNE DOSHIER [id number] 06-14-1976  Pre Procedure Tracheostomy Information  Trach Brand: Shiley Size: 6.0 XLT UP Style: Uncuffed Secured by: Velcro   Procedure: Trach change and cleaning VSS  HR 102  RR 22  Pulse oximetry 98% on RA   Post Procedure Tracheostomy Information  Trach Brand: Shiley Size: 6.0 XLT UP Style: Uncuffed Secured by: Velcro   Post Procedure Evaluation:  ETCO2 positive color change from yellow to purple : Yes.   Vital signs:blood pressure VSS, pulse 100, respirations 22 and pulse oximetry 99 % on RA Patients current condition: stable Complications: No apparent complications Trach site exam: clean Wound care done: dry Patient did tolerate procedure well.   Education: none  Prescription needs: none    Additional needs: 4 weeks for next appt.

## 2019-08-17 ENCOUNTER — Other Ambulatory Visit (HOSPITAL_COMMUNITY)
Admission: RE | Admit: 2019-08-17 | Discharge: 2019-08-17 | Disposition: A | Discharge status: Home / Self Care | Payer: Medicaid Other | Source: Ambulatory Visit | Attending: Acute Care | Admitting: Acute Care

## 2019-08-17 DIAGNOSIS — Z01812 Encounter for preprocedural laboratory examination: Secondary | ICD-10-CM | POA: Insufficient documentation

## 2019-08-17 DIAGNOSIS — Z20828 Contact with and (suspected) exposure to other viral communicable diseases: Secondary | ICD-10-CM | POA: Diagnosis not present

## 2019-08-17 LAB — SARS CORONAVIRUS 2 (TAT 6-24 HRS): SARS Coronavirus 2: NEGATIVE

## 2019-08-19 ENCOUNTER — Ambulatory Visit (HOSPITAL_COMMUNITY)
Admission: RE | Admit: 2019-08-19 | Discharge: 2019-08-19 | Disposition: A | Discharge status: Home / Self Care | Payer: Medicaid Other | Source: Ambulatory Visit | Attending: Acute Care | Admitting: Acute Care

## 2019-08-19 ENCOUNTER — Other Ambulatory Visit: Payer: Self-pay

## 2019-08-19 DIAGNOSIS — Z930 Tracheostomy status: Secondary | ICD-10-CM | POA: Diagnosis present

## 2019-08-19 DIAGNOSIS — G4733 Obstructive sleep apnea (adult) (pediatric): Secondary | ICD-10-CM | POA: Diagnosis not present

## 2019-08-19 DIAGNOSIS — E079 Disorder of thyroid, unspecified: Secondary | ICD-10-CM | POA: Insufficient documentation

## 2019-08-19 DIAGNOSIS — E669 Obesity, unspecified: Secondary | ICD-10-CM | POA: Diagnosis not present

## 2019-08-19 NOTE — Progress Notes (Signed)
[facility] [facility]   Reason for visit:  Routine trach care  HPI:  This is a 43 year old female well known to me. I follow her for chronic trach in setting of severe OSA. Last clinic visit was 11/16. Happy to see she has had no ER visits since our last appointment.  ROS  Review of Systems - History obtained from the patient General ROS: negative for - chills, fatigue, fever, malaise, sleep disturbance or weight gain Psychological ROS: negative ENT ROS: negative for - epistaxis, headaches, hearing change, nasal congestion, nasal discharge, sinus pain, sneezing or vocal changes Hematological and Lymphatic ROS: negative Endocrine ROS: negative Respiratory ROS: negative for - cough, orthopnea, pleuritic pain, shortness of breath, sputum changes, tachypnea or wheezing Cardiovascular ROS: negative for - chest pain or edema Gastrointestinal ROS: no abdominal pain, change in bowel habits, or black or bloody stools Musculoskeletal ROS: negative Neurological ROS: no TIA or stroke symptoms     Exam:  Physical Exam Constitutional:      General: She is not in acute distress.    Appearance: She is normal weight. She is not ill-appearing, toxic-appearing or diaphoretic.  HENT:     Head: Normocephalic and atraumatic.     Mouth/Throat:     Mouth: Mucous membranes are dry.     Comments: Trach stoma not erythremic No discharge     Eyes:     Conjunctiva/sclera: Conjunctivae normal.     Pupils: Pupils are equal, round, and reactive to light.  Neck:     Musculoskeletal: Normal range of motion.  Cardiovascular:     Rate and Rhythm: Normal rate and regular rhythm.     Pulses: Normal pulses.     Heart sounds: Normal heart sounds. No murmur.  Pulmonary:     Effort: Pulmonary effort is normal.     Breath sounds: Normal breath sounds.  Abdominal:     General: Bowel sounds are normal.     Palpations: Abdomen is soft.  Musculoskeletal: Normal range of motion.        General: No  swelling or tenderness.  Skin:    General: Skin is warm and dry.     Capillary Refill: Capillary refill takes less than 2 seconds.     Coloration: Skin is not pale.  Neurological:     General: No focal deficit present.     Mental Status: She is alert and oriented to person, place, and time.  Psychiatric:        Mood and Affect: Mood normal.     Trach change/procedure: # 6 proximal XLT was changed w/out difficulty.        Impression/dx  Trach dependence  Severe OSA Obesity  Thyroid mass  Discussion  She is scheduled for surgery on 12/18 for right thyroidectomy by Dr Rosendo Gros. From trach stand-point she is stable for surgery.   Plan  Continue routine trach care Will send her with extra trach (suspect anesthesia will either switch to cuffed trach OR temporarily use an cuffed ETT placed in her trach stoma intra-operatively.  She should be monitored over-night in ICU post-op  Would change her back to her regular trach once she is able post-op  Visit time: 35  minutes.   Erick Colace ACNP-BC [place]

## 2019-08-19 NOTE — Progress Notes (Signed)
Tracheostomy Procedure Note  [name] IF:[id number] 1975-11-18  Pre Procedure Tracheostomy Information  Trach Brand: Shiley Size: 6.0 XLT UP Style: Uncuffed Secured by: Velcro   Procedure: Trach cleaning and Trach change    Post Procedure Tracheostomy Information  Trach Brand: Shiley Size: 6.0 XLT UP Style: Uncuffed Secured by: Velcro   Post Procedure Evaluation:  ETCO2 positive color change from yellow to purple : Yes.   Vital signs:blood pressure VSS pulse WNL, respirations WNL and pulse oximetry 98% on RA % Patients current condition: stable Complications: No apparent complications Trach site exam: clean, dry and dry Wound care done: Other:  Trach ties and drain guaze changed Patient did tolerate procedure well.   Education: none  Prescription needs: none    Additional needs: Extra trach to take home and take with her on day of surgery 6.0 XLTUP Shiley

## 2019-08-20 NOTE — Progress Notes (Signed)
[address] [place], [place] [facility] [address] [place] [address] Phone: [phone] Fax: [phone]      Your procedure is scheduled on Friday, December 18th.  Report to [facility] Main Entrance "A" at 5:30 A.M., and check in at the Admitting office.  Call this number if you have problems the morning of surgery:  [phone]  Call [phone] if you have any questions prior to your surgery date Monday-Friday 8am-4pm    Remember:  Do not eat or drink after midnight the night before your surgery     Take these medicines the morning of surgery with A SIP OF WATER   Atorvastatin (Lipitor)  Nasal spray - if needed  Gabapentin (Neurontin)  Lamictal  Levothyroxine (Synthroid)  Pantoprazole (Protonix)  Topiramate (topamax)  Tramadol - if needed   7 days prior to surgery STOP taking any Aspirin (unless otherwise instructed by your surgeon), Aleve, Naproxen, Ibuprofen, Motrin, Advil, Goody's, BC's, all herbal medications, fish oil, and all vitamins.   WHAT DO I DO ABOUT MY DIABETES MEDICATION?   Marland Kitchen Do not take oral diabetes medicines (pills) the morning of surgery. - Metformin  . THE NIGHT BEFORE SURGERY, DO NOT take bedtime dose of Humulin R      . THE MORNING OF SURGERY, take _____________ units of __________insulin.  . The day of surgery, do not take other diabetes injectables, Victoza (liraglutide).  o If your CBG is greater than 220 mg/dL, you may take  of your sliding scale (correction) dose of insulin.   HOW TO MANAGE YOUR DIABETES BEFORE AND AFTER SURGERY  Why is it important to control my blood sugar before and after surgery? . Improving blood sugar levels before and after surgery helps healing and can limit problems. . A way of improving blood sugar control is eating a healthy diet by: o  Eating less sugar and carbohydrates o  Increasing activity/exercise o  Talking with your doctor about reaching your blood sugar  goals . High blood sugars (greater than 180 mg/dL) can raise your risk of infections and slow your recovery, so you will need to focus on controlling your diabetes during the weeks before surgery. . Make sure that the doctor who takes care of your diabetes knows about your planned surgery including the date and location.  How do I manage my blood sugar before surgery? . Check your blood sugar at least 4 times a day, starting 2 days before surgery, to make sure that the level is not too high or low. . Check your blood sugar the morning of your surgery when you wake up and every 2 hours until you get to the Short Stay unit. o If your blood sugar is less than 70 mg/dL, you will need to treat for low blood sugar: - Do not take insulin. - Treat a low blood sugar (less than 70 mg/dL) with  cup of clear juice (cranberry or apple), 4 glucose tablets, OR glucose gel. - Recheck blood sugar in 15 minutes after treatment (to make sure it is greater than 70 mg/dL). If your blood sugar is not greater than 70 mg/dL on recheck, call [phone] for further instructions. . Report your blood sugar to the short stay nurse when you get to Short Stay.  . If you are admitted to the hospital after surgery: o Your blood sugar will be checked by the staff and you will probably be given insulin after surgery (instead of oral diabetes medicines) to  make sure you have good blood sugar levels. o The goal for blood sugar control after surgery is 80-180 mg/dL.    The Morning of Surgery  Do not wear jewelry, make-up or nail polish.  Do not wear lotions, powders, or perfumes, or deodorant  Do not shave 48 hours prior to surgery.    Do not bring valuables to the hospital.  [facility] is not responsible for any belongings or valuables.  If you are a smoker, DO NOT Smoke 24 hours prior to surgery  If you wear a CPAP at night please bring your mask, tubing, and machine the morning of surgery   Remember that you must  have someone to transport you home after your surgery, and remain with you for 24 hours if you are discharged the same day.   Please bring cases for contacts, glasses, hearing aids, dentures or bridgework because it cannot be worn into surgery.    Leave your suitcase in the car.  After surgery it may be brought to your room.  For patients admitted to the hospital, discharge time will be determined by your treatment team.  Patients discharged the day of surgery will not be allowed to drive home.    Special instructions:   [place]- Preparing For Surgery  Before surgery, you can play an important role. Because skin is not sterile, your skin needs to be as free of germs as possible. You can reduce the number of germs on your skin by washing with CHG (chlorahexidine gluconate) Soap before surgery.  CHG is an antiseptic cleaner which kills germs and bonds with the skin to continue killing germs even after washing.    Oral Hygiene is also important to reduce your risk of infection.  Remember - BRUSH YOUR TEETH THE MORNING OF SURGERY WITH YOUR REGULAR TOOTHPASTE  Please do not use if you have an allergy to CHG or antibacterial soaps. If your skin becomes reddened/irritated stop using the CHG.  Do not shave (including legs and underarms) for at least 48 hours prior to first CHG shower. It is OK to shave your face.  Please follow these instructions carefully.   1. Shower the NIGHT BEFORE SURGERY and the MORNING OF SURGERY with CHG Soap.   2. If you chose to wash your hair, wash your hair first as usual with your normal shampoo.  3. After you shampoo, rinse your hair and body thoroughly to remove the shampoo.  4. Use CHG as you would any other liquid soap. You can apply CHG directly to the skin and wash gently with a scrungie or a clean washcloth.   5. Apply the CHG Soap to your body ONLY FROM THE NECK DOWN.  Do not use on open wounds or open sores. Avoid contact with your eyes, ears, mouth  and genitals (private parts). Wash Face and genitals (private parts)  with your normal soap.   6. Wash thoroughly, paying special attention to the area where your surgery will be performed.  7. Thoroughly rinse your body with warm water from the neck down.  8. DO NOT shower/wash with your normal soap after using and rinsing off the CHG Soap.  9. Pat yourself dry with a CLEAN TOWEL.  10. Wear CLEAN PAJAMAS to bed the night before surgery, wear comfortable clothes the morning of surgery  11. Place CLEAN SHEETS on your bed the night of your first shower and DO NOT SLEEP WITH PETS.    Day of Surgery:  Please shower the  morning of surgery with the CHG soap Do not apply any deodorants/lotions. Please wear clean clothes to the hospital/surgery center.   Remember to brush your teeth WITH YOUR REGULAR TOOTHPASTE.   Please read over the following fact sheets that you were given.

## 2019-08-21 ENCOUNTER — Encounter (HOSPITAL_COMMUNITY)
Admission: RE | Admit: 2019-08-21 | Discharge: 2019-08-21 | Disposition: A | Discharge status: Home / Self Care | Payer: Medicaid Other | Source: Ambulatory Visit | Attending: General Surgery | Admitting: General Surgery

## 2019-08-21 ENCOUNTER — Encounter (HOSPITAL_COMMUNITY): Payer: Self-pay

## 2019-08-21 ENCOUNTER — Other Ambulatory Visit: Payer: Self-pay

## 2019-08-21 DIAGNOSIS — E041 Nontoxic single thyroid nodule: Secondary | ICD-10-CM | POA: Diagnosis not present

## 2019-08-21 DIAGNOSIS — K3184 Gastroparesis: Secondary | ICD-10-CM | POA: Diagnosis not present

## 2019-08-21 DIAGNOSIS — Z6841 Body Mass Index (BMI) 40.0 and over, adult: Secondary | ICD-10-CM | POA: Diagnosis not present

## 2019-08-21 DIAGNOSIS — E785 Hyperlipidemia, unspecified: Secondary | ICD-10-CM | POA: Insufficient documentation

## 2019-08-21 DIAGNOSIS — E249 Cushing's syndrome, unspecified: Secondary | ICD-10-CM | POA: Insufficient documentation

## 2019-08-21 DIAGNOSIS — G4730 Sleep apnea, unspecified: Secondary | ICD-10-CM | POA: Insufficient documentation

## 2019-08-21 DIAGNOSIS — Z930 Tracheostomy status: Secondary | ICD-10-CM | POA: Diagnosis not present

## 2019-08-21 DIAGNOSIS — Z7951 Long term (current) use of inhaled steroids: Secondary | ICD-10-CM | POA: Insufficient documentation

## 2019-08-21 DIAGNOSIS — Z01812 Encounter for preprocedural laboratory examination: Secondary | ICD-10-CM | POA: Diagnosis present

## 2019-08-21 DIAGNOSIS — Z794 Long term (current) use of insulin: Secondary | ICD-10-CM | POA: Diagnosis not present

## 2019-08-21 DIAGNOSIS — E1143 Type 2 diabetes mellitus with diabetic autonomic (poly)neuropathy: Secondary | ICD-10-CM | POA: Insufficient documentation

## 2019-08-21 DIAGNOSIS — Z79899 Other long term (current) drug therapy: Secondary | ICD-10-CM | POA: Diagnosis not present

## 2019-08-21 DIAGNOSIS — Z791 Long term (current) use of non-steroidal anti-inflammatories (NSAID): Secondary | ICD-10-CM | POA: Insufficient documentation

## 2019-08-21 DIAGNOSIS — F329 Major depressive disorder, single episode, unspecified: Secondary | ICD-10-CM | POA: Diagnosis not present

## 2019-08-21 DIAGNOSIS — F419 Anxiety disorder, unspecified: Secondary | ICD-10-CM | POA: Diagnosis not present

## 2019-08-21 DIAGNOSIS — Z87891 Personal history of nicotine dependence: Secondary | ICD-10-CM | POA: Diagnosis not present

## 2019-08-21 DIAGNOSIS — M810 Age-related osteoporosis without current pathological fracture: Secondary | ICD-10-CM | POA: Insufficient documentation

## 2019-08-21 DIAGNOSIS — I10 Essential (primary) hypertension: Secondary | ICD-10-CM | POA: Diagnosis not present

## 2019-08-21 LAB — BASIC METABOLIC PANEL
Anion gap: 10 (ref 5–15)
BUN: 12 mg/dL (ref 6–20)
CO2: 24 mmol/L (ref 22–32)
Calcium: 9.5 mg/dL (ref 8.9–10.3)
Chloride: 103 mmol/L (ref 98–111)
Creatinine, Ser: 0.93 mg/dL (ref 0.44–1.00)
GFR calc Af Amer: >60 mL/min (ref >60)
GFR calc non Af Amer: >60 mL/min (ref >60)
Glucose, Bld: 259 mg/dL — ABNORMAL HIGH (ref 70–99)
Potassium: 5.6 mmol/L — ABNORMAL HIGH (ref 3.5–5.1)
Sodium: 137 mmol/L (ref 135–145)

## 2019-08-21 LAB — CBC
HCT: 40.7 % (ref 36.0–46.0)
Hemoglobin: 13.1 g/dL (ref 12.0–15.0)
MCH: 28.7 pg (ref 26.0–34.0)
MCHC: 32.2 g/dL (ref 30.0–36.0)
MCV: 89.1 fL (ref 80.0–100.0)
Platelets: 412 10*3/uL — ABNORMAL HIGH (ref 150–400)
RBC: 4.57 MIL/uL (ref 3.87–5.11)
RDW: 13.1 % (ref 11.5–15.5)
WBC: 7.4 10*3/uL (ref 4.0–10.5)
nRBC: 0 % (ref 0.0–0.2)

## 2019-08-21 LAB — GLUCOSE, CAPILLARY: Glucose-Capillary: 263 mg/dL — ABNORMAL HIGH (ref 70–99)

## 2019-08-21 LAB — HEMOGLOBIN A1C
Hgb A1c MFr Bld: 10.1 % — ABNORMAL HIGH (ref 4.8–5.6)
Mean Plasma Glucose: 243.17 mg/dL

## 2019-08-21 NOTE — Progress Notes (Addendum)
Anesthesia PAT Evaluation:  Case: [id number] Date/Time: 08/28/19 0715   Procedure: RIGHT THYROIDECTOMY (Right )   Anesthesia type: General   Pre-op diagnosis: THYROID NODULE   Location: [facility] OR ROOM 02 / [facility]   Surgeons: Ralene Ok, MD      DISCUSSION: Patient is a 43 year old female scheduled for the above procedure.  Patient evaluated by myself and anesthesiologist Suella Broad, MD due to tracheostomy and need for right thyroidectomy.  History includes former smoker (quit 05/22/17), tracheostomy (11/28/17 following ON; currently trach dependent due to severe OSA/OHS), Cushing's disease, morbid obesity, DM2 (uncontrolled), gastroparesis, HTN, HLD, OSA, anxiety, depression (with history of intentional drug OD).  Reports it takes her a while to wake up from anesthesia.  On 11/20/17 patient brought to ED after intentional OD with Baclofen, Trazodone, and tramadol. She was somnolent on arrival and then suffered a grand mal seizure and was intubated. She was extubated two days later but had "poor voice" afterwards. On 11/26/17 required rapid response with IV steroids and racemic epinephrine for mild stridor. ENT was consulted. She had another episode on 11/28/17 with bedside flexible laryngoscopy showing posterior supraglottic swelling with fixed vocal cords. She was taken to the OR and underwent direct laryngoscopy which showed very swollen arytenoids and entire subglottis circumferentially swollen with necrotic debris and fibrinous exudate. Tracheostomy was performed.  Last seen by Salvadore Dom, NP at the [facility]ostomy Clinic on 08/19/19 for trach change. She has a [place]ley #6 proximal XLT tracheostomy tube. She sees him about every three weeks. He wrote: "She is scheduled for surgery on 12/18 for right thyroidectomy by Dr Rosendo Gros. From trach stand-point she is stable for surgery.Marland KitchenMarland KitchenWill send her with extra trach (suspect anesthesia will either switch to cuffed trach OR temporarily use an cuffed ETT  placed in her trach stoma intra-operatively.  She should be monitored over-night in ICU post-op  Would change her back to her regular trach once she is able post-op" He has discussed with her that she is at increased risk for prolonged need for mechanical ventilation after surgery.  Dr. Smith Robert discussed potential intraoperative airway by switching her trach to a cuffed #6 proximal XLT tracheostomy tube, using a cuffed ETT, or possibly placing ETT via orotracheal approach (she indicated that pulmonary preferred to avoid this approach). Pulmonology gave her a new #6 proximal XLT tracheostomy tube to have available once felt able to transition back to her usual cuffless trach. I have reached out the Marni Griffon and to CRNA staff to ensure we have the appropriate sized cuffed trach and ETT for the day of surgery.    I found the following measurements for both cuffed and uncuffed #6 [place]illey proximal XLT tracheostomy tubes:  Inside: 41mm  Outside: 52mm  Length 33mm    I routed Dr. Lady Gary a staff message regarding K and A1c results. ISTAT for the day of surgery ordered. I also contacted her endocrinologist office and asked that staff contact her to discuss U500 dose recommendations on the evening before and morning of her surgery. I will also put order for DM Coordinator given her history of uncontrolled DM and use of U500.  Presurgical Covid test is scheduled for 08/25/2019.   ADDENDUM 08/24/19 9:53 AM: Marni Griffon responded and felt #6 proximal XLT tracheotomy tubes could be ordered through [employer]. (OR director will check our OR supply and order, if needed.) He added, "Sometimes going from cuffless to cuffed can be difficult as the diameter of the cuffed is just a little bigger  than the cuffless when you include the deflated cuff itself which is not included in the measurement on the box.  One request I have is please don't send her out with anything smaller in diameter than 6 even if that means  Rosendo Gros needs to revise/upsize before the case is done as she mucous plugs the 6 if I don't bring her in every 4 weeks, anything smaller will put her in the ER every couple weeks". I also forwarded this message to Dr. Rosendo Gros.  ADDENDUM 08/27/19 3:43 AM  08/26/19 9:43 AM:  I received a call from patient's endocrinologist Dr. Denton Lank on 08/27/19. I notified him of A1c result 10.1%. He attempted to contact patient for preoperative U500 instructions, but was only able to leave her a voice message with instructions to take 50 Units U500 the evening before surgery with supper but none on the morning of surgery. He advised that the anesthesiologist manage any perioperative hyperglycemia with Humulin Regular such as sliding scale. (As above, DM coordinator consult ordered for post-operative evaluation.)  I attempted to contact patient today to verify she had received Dr. Denton Lank' message, but also only got a voice message.   [facility] OR and anesthesia directors were able to locate 2 #6 cuffed proximal XLT tracheostomy tubes to have available if needed by anesthesia. There are cuffless tracheostomy tubes in this size, but aditional cuffed tubes would need to be ordered.     VS: BP 128/86   Pulse 99   Temp (!) 36.2 C (Oral)   Resp 20   Ht 5\' 2"  (1.575 m)   Wt 130.4 kg   SpO2 95%   BMI 52.56 kg/m She denied any acute cardiopulmonary symptoms.  She denies recent URI.  She reports that she is not limited in her walking due to breathing.  She has a chronic intermittent cough but denied any excessive secretions.  Speech is clear. Trach appeared clean, without secretions. Passy-Muir valve is present. Neck is large. Heart RRR, no murmur noted. Lungs clear.   PROVIDERS: Julian Hy, PA-C is PCP ([place]) - Salvadore Dom, NP follows her tracheostomy at the [facility]. - Kathlene Cote, MD is Endocrinologist ([place]). Last visit 04/20/19  with A1c 13.1%. Humulin U500 increased to 120 units with breakfast and 100 units with dinner, although patient says she is taking 110 in the morning and 100 in the evenings now.  Izora Gala, MD is ENT ([place]). She says she is now primarily seeing pulmonology instead at the [place], MD is GI. She needs future colonoscopy for further evaluation of chronic diarrhea. Colonoscopy cancelled on 07/23/19 for hyperglycemia. - Noah Charon, MD is neurologist. ([facility]). Seen on 07/07/19 for resting tremor and diabetic neuropathy.  He felt it was likely due to [employer].  Discussed having patient talk with her PCP about changing to something else or referral to psychiatry for management of bipolar disorder.  He did not want to put her on a dopamine agonist as he would rather have her try to stop the offending agent first.  We discussed getting an MRI of the brain to look for other causes, but this is still pending.    LABS: Preoperative labs noted. K 5.6 (no mention of hemolysis), Cr 0.95. H/H 13.1/40.7, PLT 412K. A1c 10.1%, down from 13.1% from 04/20/19. I routed K and A1c results to Dr. Rosendo Gros. ISTAT on the day of surgery to recheck K+ and glucose. (  all labs ordered are listed, but only abnormal results are displayed)  Labs Reviewed  GLUCOSE, CAPILLARY - Abnormal; Notable for the following components:      Result Value   Glucose-Capillary 263 (*)    All other components within normal limits  BASIC METABOLIC PANEL - Abnormal; Notable for the following components:   Potassium 5.6 (*)    Glucose, Bld 259 (*)    All other components within normal limits  CBC - Abnormal; Notable for the following components:   Platelets 412 (*)    All other components within normal limits  HEMOGLOBIN A1C - Abnormal; Notable for the following components:   Hgb A1c MFr Bld 10.1 (*)    All other components within normal limits     IMAGES: Thyroid US  04/23/19: FINDINGS: Parenchymal Echotexture: Mildly heterogenous Isthmus: 4 mm Right lobe: 4.4 x 2.0 x 2.1 cm Left lobe: 3.4 x 1.3 x 2.1 cm - FNA right thyroid nodule on 05/07/19: Atypia of undetermined significance or follicular lesion of undetermined significance (Bethesda Category III).   1V PCXR 05/20/19: FINDINGS: Endotracheal tube projects over the mid trachea. Lungs are clear. Heart is borderline in size. No effusions. No acute bony abnormality. IMPRESSION: No active disease.   EKG: EKG 05/20/19: ST at 118 bpm. Low voltage QRS.   CV: Echo 12/22/17: Study Conclusions - Left ventricle: The cavity size was normal. There was mild concentric hypertrophy. Systolic function was normal. The estimated ejection fraction was in the range of 55% to 60%. Although no diagnostic regional wall motion abnormality was identified, this possibility cannot be completely excluded on the basis of this study. Left ventricular diastolic function parameters were normal.   Past Medical History:  Diagnosis Date  . Anxiety   . Complication of anesthesia    Pt. states takes long time to wake up from it.   . Cushing's disease ([place])   . Depression   . Diabetes ([place])   . Gastroparesis   . Hyperlipidemia   . Hyperlipidemia   . Hypertension   . Intentional drug overdose ([place])   . Morbid obesity ([place])   . Osteoporosis 07/19/2015  . Other cervical disc degeneration, unspecified cervical region   . Periprosthetic fracture 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[place]), R tibial plateau  07/18/2015  . Sleep apnea    has Trach  . Uncontrolled diabetes mellitus with diabetic neuropathy, with long-term current use of insulin ([place]) 07/16/2015  . Vitamin D deficiency 07/19/2015    Past Surgical History:  Procedure Laterality Date  . ANTERIOR TALOFIBULAR LIGAMENT REPAIR Left 11/15/2014   Procedure: ANTERIOR TALOFIBULAR LIGAMENT REPAIR;  Surgeon: Jana Half, DPM;  Location: [facility];  Service: Podiatry;  Laterality: Left;  . CESAREAN SECTION  dec 1997/  06-03-2001/   01-01-2005   BILATERAL TUBAL LIGATION WITH LAST ONE  . DILATION AND CURETTAGE OF UTERUS  1995   WITH SUCTION  . DIRECT LARYNGOSCOPY N/A 12/18/2017   Procedure: DIRECT LARYNGOSCOPY;  Surgeon: Izora Gala, MD;  Location: [facility];  Service: ENT;  Laterality: N/A;  . ESOPHAGOGASTRODUODENOSCOPY (EGD) WITH PROPOFOL N/A 10/04/2016   Procedure: ESOPHAGOGASTRODUODENOSCOPY (EGD) WITH PROPOFOL;  Surgeon: [name], MD;  Location: [facility];  Service: Endoscopy;  Laterality: N/A;  . LAPAROSCOPIC CHOLECYSTECTOMY  09-25-2005  . ORIF TIBIA PLATEAU Right 07/19/2015   Procedure: OPEN REDUCTION INTERNAL FIXATION (ORIF) RIGHT TIBIAL PLATEAU;  Surgeon: Alt[place]e [place], MD;  Location: [facility];  Service: Orthopedics;  Laterality: Right;  . PARTIAL KNEE ARTHROPLASTY Right  04/19/2014   Procedure: RIGHT UNI KNEE ARTHROPLASTY MEDIALLY ;  Surgeon: Mauri Pole, MD;  Location: [facility];  Service: Orthopedics;  Laterality: Right;  . TRACHEOSTOMY TUBE PLACEMENT N/A 11/28/2017   Procedure: TRACHEOSTOMY;  Surgeon: Izora Gala, MD;  Location: [facility];  Service: ENT;  Laterality: N/A;  . TUBAL LIGATION      MEDICATIONS: . atorvastatin (LIPITOR) 20 MG tablet  . azelastine (ASTELIN) 0.1 % nasal spray  . cariprazine (VRAYLAR) capsule  . celecoxib (CELEBREX) 200 MG capsule  . cholestyramine (QUESTRAN) 4 g packet  . fluticasone (FLONASE) 50 MCG/ACT nasal spray  . gabapentin (NEURONTIN) 600 MG tablet  . hydrOXYzine (VISTARIL) 50 MG capsule  . insulin regular human CONCENTRATED (HUMULIN R) 500 UNIT/ML kwikpen  . lamoTRIgine (LAMICTAL) 25 MG tablet  . levothyroxine (SYNTHROID, LEVOTHROID) 125 MCG tablet  . liraglutide (VICTOZA) 18 MG/3ML SOPN  . metFORMIN (GLUCOPHAGE) 1000 MG tablet  . pantoprazole (PROTONIX) 40 MG tablet  . topiramate (TOPAMAX) 25 MG tablet  . traMADol (ULTRAM) 50 MG tablet  . traZODone (DESYREL) 150 MG tablet    No current facility-administered medications for this encounter.    Myra Gianotti, PA-C Surgical Short Stay/Anesthesiology [facility] Phone [phone] [facility] Phone [phone] 08/21/2019 7:27 PM

## 2019-08-21 NOTE — Progress Notes (Signed)
Allison notified of A1C and abnormal lab results.  Per Ebony Hail, she has messaged Dr. Rosendo Gros in regards to abnormal lab work.  Chart sent to anesthesia.

## 2019-08-21 NOTE — Progress Notes (Signed)
PCP - [facility] Cardiologist - denies  Chest x-ray - 05-12-19 EKG - 05-21-19 ECHO - 12-22-17  SA - yes, pt has a trach  DM - Type 2 Fasting Blood Sugar - 120-130s CBG 263 (today) - pt stated she did not take her diabetic medications before coming to her PAT appointment     COVID TEST- Tuesday, Dec. 15th   Anesthesia review: yes, per Dr. Raynald Kemp visiting patient at PAT appointment.  Patient denies shortness of breath, fever, cough and chest pain at PAT appointment   All instructions explained to the patient, with a verbal understanding of the material. Patient agrees to go over the instructions while at home for a better understanding. Patient also instructed to self quarantine after being tested for COVID-19. The opportunity to ask questions was provided.

## 2019-08-24 NOTE — Anesthesia Preprocedure Evaluation (Addendum)
Anesthesia Evaluation  Patient identified by MRN, date of birth, ID band Patient awake    Reviewed: Allergy & Precautions, NPO status , Patient's Chart, lab work & pertinent test results  Airway Mallampati: II  TM Distance: >3 FB Neck ROM: Full    Dental no notable dental hx.    Pulmonary shortness of breath, sleep apnea , former smoker,    Pulmonary exam normal breath sounds clear to auscultation       Cardiovascular hypertension, negative cardio ROS Normal cardiovascular exam Rhythm:Regular Rate:Normal     Neuro/Psych Seizures -,  Anxiety Depression negative psych ROS   GI/Hepatic negative GI ROS, Neg liver ROS,   Endo/Other  diabetesHypothyroidism Morbid obesity  Renal/GU negative Renal ROS  negative genitourinary   Musculoskeletal  (+) Arthritis , Osteoarthritis,    Abdominal (+) + obese,   Peds negative pediatric ROS (+)  Hematology negative hematology ROS (+)   Anesthesia Other Findings   Reproductive/Obstetrics negative OB ROS                            Anesthesia Physical Anesthesia Plan  ASA: IV  Anesthesia Plan: General   Post-op Pain Management:    Induction: Intravenous  PONV Risk Score and Plan: 3 and Ondansetron, Dexamethasone, Midazolam and Treatment may vary due to age or medical condition  Airway Management Planned: Oral ETT and Tracheostomy  Additional Equipment:   Intra-op Plan:   Post-operative Plan: Extubation in OR and Possible Post-op intubation/ventilation  Informed Consent: I have reviewed the patients History and Physical, chart, labs and discussed the procedure including the risks, benefits and alternatives for the proposed anesthesia with the patient or authorized representative who has indicated his/her understanding and acceptance.     Dental advisory given  Plan Discussed with: CRNA  Anesthesia Plan Comments: (See PAT note written by  Myra Gianotti, PA-C. Patient had PAT evaluation with Dr. Smith Robert regarding potential airway options for surgery. She currently has a #6 proximal XLT cuffless tracheostomy tube (s/p trach 11/28/17). Lurline Idol remains due to morbid obesity, OSA/obesity hypoventilation syndrome.   Lurline Idol is followed by Marni Griffon, NP with [facility]. He notes that "Sometimes going from cuffless to cuffed can be difficult as the diameter of the cuffed is just a little bigger than the cuffless when you include the deflated cuff itself which is not included in the measurement on the box. One request I have is please don't send her out with anything smaller in diameter than 6 even if that means Rosendo Gros needs to revise/upsize before the case is done as she mucous plugs the 6 if I don't bring her in every 4 weeks, anything smaller will put her in the ER every couple weeks." Post-operatively, once deemed appropriate, he did give her a new  #6 proximal XLT cuffless tracheostomy tube that can be changed out prior to discharge.      She is on U500 insulin.  )       Anesthesia Quick Evaluation

## 2019-08-25 ENCOUNTER — Other Ambulatory Visit (HOSPITAL_COMMUNITY)
Admission: RE | Admit: 2019-08-25 | Discharge: 2019-08-25 | Disposition: A | Discharge status: Home / Self Care | Payer: Medicaid Other | Source: Ambulatory Visit | Attending: General Surgery | Admitting: General Surgery

## 2019-08-25 DIAGNOSIS — Z20828 Contact with and (suspected) exposure to other viral communicable diseases: Secondary | ICD-10-CM | POA: Insufficient documentation

## 2019-08-25 DIAGNOSIS — Z01812 Encounter for preprocedural laboratory examination: Secondary | ICD-10-CM | POA: Insufficient documentation

## 2019-08-26 LAB — NOVEL CORONAVIRUS, NAA (HOSP ORDER, SEND-OUT TO REF LAB; TAT 18-24 HRS): SARS-CoV-2, NAA: NOT DETECTED

## 2019-08-27 MED ORDER — VANCOMYCIN HCL 1500 MG/300ML IV SOLN
1500.0000 mg | INTRAVENOUS | Status: AC
  Administered 2019-08-28: 1500 mg via INTRAVENOUS
  Filled 2019-08-27: qty 300

## 2019-08-27 NOTE — H&P (Signed)
History of Present Illness  The patient is a 43 year old female who presents with a thyroid nodule. Referred by: Dr. Nelva Bush Chief Complaint: Right thyroid nodule  Patient is a 43 year old female, with a history of diabetes, obesity, recent tracheostomy secondary to obesity, hyperlipidemia, he comes in with a right thyroid nodule. Patient was recently undergoing imaging of her neck secondary to neck pain by Dr. Nelva Bush. This resulted in the finding of a right thyroid nodule. This was subsequently worked up. Patient underwent an ultrasound. Ultrasound revealed a 2.1 cm right thyroid nodule. This was subsequently biopsied. Biopsy resulted in "atypia of undetermined significance or follicular lesion of undetermined significance Bethesda category 3 ". I did review the ultrasound independently as well as the pathology.  Patient was referred here for further management.  Patient states that she's had significant no significant symptoms she is currently on thyroxine.  Patient recently underwent tracheostomy 2017 secondary to OD which required tracheostomy. Patient states that she follows up with the [facility] with the pulmonary group. She states that the plan on leaving this in secondary to Covid at this time.    Past Surgical History  Knee Surgery  Right.  Diagnostic Studies History  Colonoscopy  never  Allergies  Penicillins  Sulfa Antibiotics  Bactrim *ANTI-INFECTIVE AGENTS - MISC.*  Allergies Reconciled   Medication History  Atorvastatin Calcium (40MG  Tablet, Oral) Active. Vraylar (3MG  Capsule, Oral) Active. Gabapentin (600MG  Tablet, Oral) Active. hydrOXYzine HCl (50MG  Tablet, Oral) Active. Levothyroxine Sodium (125MCG Capsule, Oral) Active. metFORMIN HCl ER (OSM) (1000MG  Tablet ER 24HR, Oral) Active. traZODone HCl (150MG  Tablet, Oral) Active. Victoza (18MG /3ML Soln Pen-inj, Subcutaneous) Active. lamoTRIgine (25MG  Tablet, Oral) Active. Pantoprazole  Sodium (40MG  Tablet DR, Oral) Active. Topiramate (25MG  Tablet, Oral) Active. traMADol HCl (50MG  Tablet, Oral) Active. Celecoxib (200MG  Capsule, Oral) Active. Medications Reconciled  Social History Alcohol use  Occasional alcohol use. Caffeine use  Carbonated beverages. No drug use  Tobacco use  Former smoker.  Pregnancy / Birth History  Age at menarche  59 years. Gravida  3 Irregular periods  Maternal age  32-30 Para  3  Other Problems Back Pain  Diabetes Mellitus  Hypercholesterolemia  Sleep Apnea     Review of Systems  General Not Present- Appetite Loss, Chills, Fatigue, Fever, Night Sweats, Weight Gain and Weight Loss. Skin Present- Rash. Not Present- Change in Wart/Mole, Dryness, Hives, Jaundice, New Lesions, Non-Healing Wounds and Ulcer. HEENT Present- Ringing in the Ears. Not Present- Earache, Hearing Loss, Hoarseness, Nose Bleed, Oral Ulcers, Seasonal Allergies, Sinus Pain, Sore Throat, Visual Disturbances, Wears glasses/contact lenses and Yellow Eyes. Respiratory Not Present- Bloody sputum, Chronic Cough, Difficulty Breathing, Snoring and Wheezing. Breast Not Present- Breast Mass, Breast Pain, Nipple Discharge and Skin Changes. Cardiovascular Present- Rapid Heart Rate and Shortness of Breath. Not Present- Chest Pain, Difficulty Breathing Lying Down, Leg Cramps, Palpitations and Swelling of Extremities. Gastrointestinal Not Present- Abdominal Pain, Bloating, Bloody Stool, Change in Bowel Habits, Chronic diarrhea, Constipation, Difficulty Swallowing, Excessive gas, Gets full quickly at meals, Hemorrhoids, Indigestion, Nausea, Rectal Pain and Vomiting. Female Genitourinary Not Present- Frequency, Nocturia, Painful Urination, Pelvic Pain and Urgency. Musculoskeletal Present- Back Pain, Joint Pain and Joint Stiffness. Not Present- Muscle Pain, Muscle Weakness and Swelling of Extremities. Neurological Present- Headaches. Not Present- Decreased Memory,  Fainting, Numbness, Seizures, Tingling, Tremor, Trouble walking and Weakness. Psychiatric Not Present- Anxiety, Bipolar, Change in Sleep Pattern, Depression, Fearful and Frequent crying. Endocrine Present- Hair Changes. Not Present- Cold Intolerance, Excessive Hunger, Heat Intolerance, Hot flashes and New  Diabetes. Hematology Not Present- Blood Thinners, Easy Bruising, Excessive bleeding, Gland problems, HIV and Persistent Infections. All other systems negative  Vitals  06/04/2019 10:17 AM Weight: 280 lb Height: 59in Body Surface Area: 2.13 m Body Mass Index: 56.55 kg/m  Temp.: 97.21F  Pulse: 130 (Regular)  BP: 108/78(Sitting, Left Arm, Standard)       Physical Exam The physical exam findings are as follows: Note: Constitutional: No acute distress, conversant, appears stated age  Eyes: Anicteric sclerae, moist conjunctiva, no lid lag  Neck: No thyromegaly, trachea midline, no cervical lymphadenopathy, tracheostomy in place, no palpable masses, enlarged neck.  Lungs: Clear to auscultation biilaterally, normal respiratory effot  Cardiovascular: regular rate & rhythm, no murmurs, no peripheal edema, pedal pulses 2+  GI: Soft, no masses or hepatosplenomegaly, non-tender to palpation  MSK: Normal gait, no clubbing cyanosis, edema  Skin: No rashes, palpation reveals normal skin turgor  Psychiatric: Appropriate judgment and insight, oriented to person, place, and time    Assessment & Plan   THYROID NODULE (E04.1) Impression: Patient is a 43 year old female, with a history of diabetes, obesity, recent tracheostomy secondary to obesity, hyperlipidemia, he comes in with a right thyroid nodule. I long discussion with the patient in regards to her pathology and the 10-30% chance of possible cancer risk based on her biopsy. I discussed the next up with likely be right thyroidectomy.  1. We will obtain pulmonary clearance prior to scheduling  surgery. 2. We will proceed to the operating for right thyroidectomy. 3. All risks and benefits were discussed with the patient to generally include: infection, bleeding, damage to the recurrent laryngeal nerve, damage to parathyroid glands, and possible need for further surgery. Alternatives were offered and described. All questions were answered and the patient voiced understanding of the procedure and wishes to proceed.

## 2019-08-28 ENCOUNTER — Other Ambulatory Visit: Payer: Self-pay

## 2019-08-28 ENCOUNTER — Encounter (HOSPITAL_COMMUNITY)
Admission: RE | Disposition: A | Discharge status: Home / Self Care | Payer: Self-pay | Source: Home / Self Care | Attending: General Surgery

## 2019-08-28 ENCOUNTER — Ambulatory Visit (HOSPITAL_COMMUNITY): Payer: Medicaid Other | Admitting: Vascular Surgery

## 2019-08-28 ENCOUNTER — Observation Stay (HOSPITAL_COMMUNITY)
Admission: RE | Admit: 2019-08-28 | Discharge: 2019-08-29 | Disposition: A | Discharge status: Home / Self Care | Payer: Medicaid Other | Attending: General Surgery | Admitting: General Surgery

## 2019-08-28 ENCOUNTER — Encounter (HOSPITAL_COMMUNITY): Payer: Self-pay | Admitting: General Surgery

## 2019-08-28 DIAGNOSIS — Z930 Tracheostomy status: Secondary | ICD-10-CM | POA: Insufficient documentation

## 2019-08-28 DIAGNOSIS — Z791 Long term (current) use of non-steroidal anti-inflammatories (NSAID): Secondary | ICD-10-CM | POA: Insufficient documentation

## 2019-08-28 DIAGNOSIS — F419 Anxiety disorder, unspecified: Secondary | ICD-10-CM | POA: Diagnosis not present

## 2019-08-28 DIAGNOSIS — G4733 Obstructive sleep apnea (adult) (pediatric): Secondary | ICD-10-CM | POA: Insufficient documentation

## 2019-08-28 DIAGNOSIS — I10 Essential (primary) hypertension: Secondary | ICD-10-CM | POA: Insufficient documentation

## 2019-08-28 DIAGNOSIS — K3184 Gastroparesis: Secondary | ICD-10-CM | POA: Diagnosis not present

## 2019-08-28 DIAGNOSIS — Z6841 Body Mass Index (BMI) 40.0 and over, adult: Secondary | ICD-10-CM | POA: Diagnosis not present

## 2019-08-28 DIAGNOSIS — E063 Autoimmune thyroiditis: Secondary | ICD-10-CM | POA: Diagnosis not present

## 2019-08-28 DIAGNOSIS — E7800 Pure hypercholesterolemia, unspecified: Secondary | ICD-10-CM | POA: Diagnosis not present

## 2019-08-28 DIAGNOSIS — E1143 Type 2 diabetes mellitus with diabetic autonomic (poly)neuropathy: Secondary | ICD-10-CM | POA: Diagnosis not present

## 2019-08-28 DIAGNOSIS — D34 Benign neoplasm of thyroid gland: Principal | ICD-10-CM | POA: Insufficient documentation

## 2019-08-28 DIAGNOSIS — E785 Hyperlipidemia, unspecified: Secondary | ICD-10-CM | POA: Diagnosis not present

## 2019-08-28 DIAGNOSIS — Z9889 Other specified postprocedural states: Secondary | ICD-10-CM

## 2019-08-28 DIAGNOSIS — Z87891 Personal history of nicotine dependence: Secondary | ICD-10-CM | POA: Diagnosis not present

## 2019-08-28 DIAGNOSIS — F329 Major depressive disorder, single episode, unspecified: Secondary | ICD-10-CM | POA: Diagnosis not present

## 2019-08-28 DIAGNOSIS — E890 Postprocedural hypothyroidism: Secondary | ICD-10-CM

## 2019-08-28 DIAGNOSIS — E041 Nontoxic single thyroid nodule: Secondary | ICD-10-CM | POA: Diagnosis present

## 2019-08-28 DIAGNOSIS — Z79899 Other long term (current) drug therapy: Secondary | ICD-10-CM | POA: Diagnosis not present

## 2019-08-28 DIAGNOSIS — E249 Cushing's syndrome, unspecified: Secondary | ICD-10-CM | POA: Insufficient documentation

## 2019-08-28 HISTORY — PX: THYROIDECTOMY: SHX17

## 2019-08-28 HISTORY — PX: TRACHEAL ESOPHAGEAL PROSTHESIS (TEP) CHANGE: SHX6131

## 2019-08-28 LAB — GLUCOSE, CAPILLARY
Glucose-Capillary: 116 mg/dL — ABNORMAL HIGH (ref 70–99)
Glucose-Capillary: 125 mg/dL — ABNORMAL HIGH (ref 70–99)
Glucose-Capillary: 476 mg/dL — ABNORMAL HIGH (ref 70–99)

## 2019-08-28 LAB — POCT I-STAT, CHEM 8
BUN: 14 mg/dL (ref 6–20)
Calcium, Ion: 1.15 mmol/L (ref 1.15–1.40)
Chloride: 103 mmol/L (ref 98–111)
Creatinine, Ser: 0.7 mg/dL (ref 0.44–1.00)
Glucose, Bld: 142 mg/dL — ABNORMAL HIGH (ref 70–99)
HCT: 35 % — ABNORMAL LOW (ref 36.0–46.0)
Hemoglobin: 11.9 g/dL — ABNORMAL LOW (ref 12.0–15.0)
Potassium: 4.3 mmol/L (ref 3.5–5.1)
Sodium: 138 mmol/L (ref 135–145)
TCO2: 25 mmol/L (ref 22–32)

## 2019-08-28 LAB — POCT PREGNANCY, URINE: Preg Test, Ur: NEGATIVE

## 2019-08-28 SURGERY — THYROIDECTOMY
Anesthesia: General | Site: Neck | Laterality: Right

## 2019-08-28 MED ORDER — ONDANSETRON 4 MG PO TBDP
4.0000 mg | ORAL_TABLET | Freq: Four times a day (QID) | ORAL | Status: DC | PRN
  Filled 2019-08-28: qty 1

## 2019-08-28 MED ORDER — GABAPENTIN 600 MG PO TABS
600.0000 mg | ORAL_TABLET | Freq: Two times a day (BID) | ORAL | Status: DC
  Administered 2019-08-28 – 2019-08-29 (×2): 600 mg via ORAL
  Filled 2019-08-28 (×2): qty 1

## 2019-08-28 MED ORDER — INSULIN ASPART 100 UNIT/ML ~~LOC~~ SOLN
0.0000 [IU] | Freq: Three times a day (TID) | SUBCUTANEOUS | Status: DC
  Administered 2019-08-28: 18:00:00 20 [IU] via SUBCUTANEOUS
  Administered 2019-08-29: 09:00:00 7 [IU] via SUBCUTANEOUS

## 2019-08-28 MED ORDER — ONDANSETRON HCL 4 MG/2ML IJ SOLN
INTRAMUSCULAR | Status: AC
  Filled 2019-08-28: qty 2

## 2019-08-28 MED ORDER — ALBUTEROL SULFATE HFA 108 (90 BASE) MCG/ACT IN AERS
INHALATION_SPRAY | RESPIRATORY_TRACT | Status: DC | PRN
  Administered 2019-08-28: 5 via RESPIRATORY_TRACT

## 2019-08-28 MED ORDER — LAMOTRIGINE 25 MG PO TABS
25.0000 mg | ORAL_TABLET | Freq: Every day | ORAL | Status: DC
  Administered 2019-08-29: 09:00:00 25 mg via ORAL
  Filled 2019-08-28: qty 1

## 2019-08-28 MED ORDER — SUGAMMADEX SODIUM 200 MG/2ML IV SOLN
INTRAVENOUS | Status: DC | PRN
  Administered 2019-08-28: 400 mg via INTRAVENOUS

## 2019-08-28 MED ORDER — ONDANSETRON HCL 4 MG/2ML IJ SOLN
INTRAMUSCULAR | Status: DC | PRN
  Administered 2019-08-28: 4 mg via INTRAVENOUS

## 2019-08-28 MED ORDER — LABETALOL HCL 5 MG/ML IV SOLN
10.0000 mg | INTRAVENOUS | Status: DC | PRN
  Administered 2019-08-28: 10 mg via INTRAVENOUS

## 2019-08-28 MED ORDER — DEXAMETHASONE SODIUM PHOSPHATE 10 MG/ML IJ SOLN
INTRAMUSCULAR | Status: DC | PRN
  Administered 2019-08-28: 10 mg via INTRAVENOUS

## 2019-08-28 MED ORDER — ACETAMINOPHEN 500 MG PO TABS: 1000.0000 mg | ORAL_TABLET | ORAL | Status: AC

## 2019-08-28 MED ORDER — HYDROXYZINE HCL 25 MG PO TABS
50.0000 mg | ORAL_TABLET | Freq: Every day | ORAL | Status: DC
  Administered 2019-08-28: 50 mg via ORAL
  Filled 2019-08-28: qty 2

## 2019-08-28 MED ORDER — LIRAGLUTIDE 18 MG/3ML ~~LOC~~ SOPN: 1.8000 mg | PEN_INJECTOR | Freq: Every day | SUBCUTANEOUS | Status: DC

## 2019-08-28 MED ORDER — LIDOCAINE 2% (20 MG/ML) 5 ML SYRINGE
INTRAMUSCULAR | Status: DC | PRN
  Administered 2019-08-28: 50 mg via INTRAVENOUS

## 2019-08-28 MED ORDER — BUPIVACAINE HCL (PF) 0.25 % IJ SOLN
INTRAMUSCULAR | Status: AC
  Filled 2019-08-28: qty 30

## 2019-08-28 MED ORDER — HYDROMORPHONE HCL 1 MG/ML IJ SOLN: 1.0000 mg | INTRAMUSCULAR | Status: DC | PRN

## 2019-08-28 MED ORDER — OXYCODONE HCL 5 MG/5ML PO SOLN: 5.0000 mg | Freq: Once | ORAL | Status: DC | PRN

## 2019-08-28 MED ORDER — PROMETHAZINE HCL 25 MG/ML IJ SOLN: 6.2500 mg | INTRAMUSCULAR | Status: DC | PRN

## 2019-08-28 MED ORDER — TOPIRAMATE 25 MG PO TABS
25.0000 mg | ORAL_TABLET | Freq: Two times a day (BID) | ORAL | Status: DC
  Administered 2019-08-28 – 2019-08-29 (×2): 25 mg via ORAL
  Filled 2019-08-28 (×3): qty 1

## 2019-08-28 MED ORDER — 0.9 % SODIUM CHLORIDE (POUR BTL) OPTIME
TOPICAL | Status: DC | PRN
  Administered 2019-08-28: 08:00:00 1000 mL

## 2019-08-28 MED ORDER — FENTANYL CITRATE (PF) 250 MCG/5ML IJ SOLN
INTRAMUSCULAR | Status: DC | PRN
  Administered 2019-08-28: 50 ug via INTRAVENOUS
  Administered 2019-08-28: 100 ug via INTRAVENOUS
  Administered 2019-08-28: 25 ug via INTRAVENOUS

## 2019-08-28 MED ORDER — DEXTROSE-NACL 5-0.9 % IV SOLN: INTRAVENOUS | Status: DC

## 2019-08-28 MED ORDER — ROCURONIUM BROMIDE 10 MG/ML (PF) SYRINGE
PREFILLED_SYRINGE | INTRAVENOUS | Status: AC
  Filled 2019-08-28: qty 10

## 2019-08-28 MED ORDER — PROPOFOL 10 MG/ML IV BOLUS
INTRAVENOUS | Status: DC | PRN
  Administered 2019-08-28: 150 mg via INTRAVENOUS
  Administered 2019-08-28: 50 mg via INTRAVENOUS

## 2019-08-28 MED ORDER — CHLORHEXIDINE GLUCONATE CLOTH 2 % EX PADS: 6.0000 | MEDICATED_PAD | Freq: Once | CUTANEOUS | Status: DC

## 2019-08-28 MED ORDER — ONDANSETRON HCL 4 MG/2ML IJ SOLN: 4.0000 mg | Freq: Four times a day (QID) | INTRAMUSCULAR | Status: DC | PRN

## 2019-08-28 MED ORDER — LIDOCAINE 2% (20 MG/ML) 5 ML SYRINGE
INTRAMUSCULAR | Status: AC
  Filled 2019-08-28: qty 5

## 2019-08-28 MED ORDER — ACETAMINOPHEN 500 MG PO TABS
ORAL_TABLET | ORAL | Status: AC
  Administered 2019-08-28: 1000 mg via ORAL
  Filled 2019-08-28: qty 2

## 2019-08-28 MED ORDER — MIDAZOLAM HCL 2 MG/2ML IJ SOLN
INTRAMUSCULAR | Status: DC | PRN
  Administered 2019-08-28 (×2): 1 mg via INTRAVENOUS

## 2019-08-28 MED ORDER — CELECOXIB 200 MG PO CAPS
ORAL_CAPSULE | ORAL | Status: AC
  Administered 2019-08-28: 06:00:00 200 mg via ORAL
  Filled 2019-08-28: qty 1

## 2019-08-28 MED ORDER — FENTANYL CITRATE (PF) 250 MCG/5ML IJ SOLN
INTRAMUSCULAR | Status: AC
  Filled 2019-08-28: qty 5

## 2019-08-28 MED ORDER — PHENYLEPHRINE 40 MCG/ML (10ML) SYRINGE FOR IV PUSH (FOR BLOOD PRESSURE SUPPORT)
PREFILLED_SYRINGE | INTRAVENOUS | Status: DC | PRN
  Administered 2019-08-28: 40 ug via INTRAVENOUS
  Administered 2019-08-28 (×2): 80 ug via INTRAVENOUS
  Administered 2019-08-28: 120 ug via INTRAVENOUS
  Administered 2019-08-28: 80 ug via INTRAVENOUS

## 2019-08-28 MED ORDER — HEMOSTATIC AGENTS (NO CHARGE) OPTIME
TOPICAL | Status: DC | PRN
  Administered 2019-08-28: 1 via TOPICAL

## 2019-08-28 MED ORDER — TRAMADOL HCL 50 MG PO TABS: 50.0000 mg | ORAL_TABLET | Freq: Four times a day (QID) | ORAL | Status: DC | PRN

## 2019-08-28 MED ORDER — BUPIVACAINE HCL 0.25 % IJ SOLN
INTRAMUSCULAR | Status: DC | PRN
  Administered 2019-08-28: 30 mL

## 2019-08-28 MED ORDER — ROCURONIUM BROMIDE 10 MG/ML (PF) SYRINGE
PREFILLED_SYRINGE | INTRAVENOUS | Status: DC | PRN
  Administered 2019-08-28: 70 mg via INTRAVENOUS

## 2019-08-28 MED ORDER — FLUTICASONE PROPIONATE 50 MCG/ACT NA SUSP: 1.0000 | Freq: Every day | NASAL | Status: DC | PRN

## 2019-08-28 MED ORDER — PHENOL 1.4 % MT LIQD: 1.0000 | OROMUCOSAL | Status: DC | PRN

## 2019-08-28 MED ORDER — PHENYLEPHRINE HCL-NACL 10-0.9 MG/250ML-% IV SOLN
INTRAVENOUS | Status: DC | PRN
  Administered 2019-08-28: 25 ug/min via INTRAVENOUS

## 2019-08-28 MED ORDER — OXYCODONE HCL 5 MG PO TABS: 5.0000 mg | ORAL_TABLET | Freq: Once | ORAL | Status: DC | PRN

## 2019-08-28 MED ORDER — LABETALOL HCL 5 MG/ML IV SOLN
INTRAVENOUS | Status: AC
  Filled 2019-08-28: qty 4

## 2019-08-28 MED ORDER — LACTATED RINGERS IV SOLN: INTRAVENOUS | Status: DC | PRN

## 2019-08-28 MED ORDER — SUCCINYLCHOLINE CHLORIDE 200 MG/10ML IV SOSY
PREFILLED_SYRINGE | INTRAVENOUS | Status: DC | PRN
  Administered 2019-08-28: 120 mg via INTRAVENOUS

## 2019-08-28 MED ORDER — HYDROMORPHONE HCL 1 MG/ML IJ SOLN
INTRAMUSCULAR | Status: AC
  Filled 2019-08-28: qty 1

## 2019-08-28 MED ORDER — PROPOFOL 10 MG/ML IV BOLUS
INTRAVENOUS | Status: AC
  Filled 2019-08-28: qty 40

## 2019-08-28 MED ORDER — TRAZODONE HCL 50 MG PO TABS
150.0000 mg | ORAL_TABLET | Freq: Every day | ORAL | Status: DC
  Administered 2019-08-28: 150 mg via ORAL
  Filled 2019-08-28: qty 1

## 2019-08-28 MED ORDER — CELECOXIB 200 MG PO CAPS: 200.0000 mg | ORAL_CAPSULE | ORAL | Status: AC

## 2019-08-28 MED ORDER — PHENYLEPHRINE 40 MCG/ML (10ML) SYRINGE FOR IV PUSH (FOR BLOOD PRESSURE SUPPORT)
PREFILLED_SYRINGE | INTRAVENOUS | Status: AC
  Filled 2019-08-28: qty 10

## 2019-08-28 MED ORDER — HYDROCODONE-ACETAMINOPHEN 5-325 MG PO TABS
1.0000 | ORAL_TABLET | ORAL | Status: DC | PRN
  Administered 2019-08-28: 20:00:00 2 via ORAL
  Filled 2019-08-28: qty 2

## 2019-08-28 MED ORDER — AZELASTINE HCL 0.1 % NA SOLN: 1.0000 | Freq: Every day | NASAL | Status: DC | PRN

## 2019-08-28 MED ORDER — PANTOPRAZOLE SODIUM 40 MG PO TBEC
40.0000 mg | DELAYED_RELEASE_TABLET | Freq: Every day | ORAL | Status: DC
  Administered 2019-08-29: 09:00:00 40 mg via ORAL
  Filled 2019-08-28: qty 1

## 2019-08-28 MED ORDER — MIDAZOLAM HCL 2 MG/2ML IJ SOLN
INTRAMUSCULAR | Status: AC
  Filled 2019-08-28: qty 2

## 2019-08-28 MED ORDER — SUGAMMADEX SODIUM 500 MG/5ML IV SOLN
INTRAVENOUS | Status: AC
  Filled 2019-08-28: qty 5

## 2019-08-28 MED ORDER — DEXAMETHASONE SODIUM PHOSPHATE 10 MG/ML IJ SOLN
INTRAMUSCULAR | Status: AC
  Filled 2019-08-28: qty 1

## 2019-08-28 MED ORDER — CARIPRAZINE HCL 1.5 MG PO CAPS
3.0000 mg | ORAL_CAPSULE | Freq: Every day | ORAL | Status: DC
  Administered 2019-08-28: 3 mg via ORAL
  Filled 2019-08-28: qty 2
  Filled 2019-08-28: qty 1
  Filled 2019-08-28: qty 2

## 2019-08-28 MED ORDER — SUCCINYLCHOLINE CHLORIDE 200 MG/10ML IV SOSY
PREFILLED_SYRINGE | INTRAVENOUS | Status: AC
  Filled 2019-08-28: qty 10

## 2019-08-28 MED ORDER — HYDROMORPHONE HCL 1 MG/ML IJ SOLN
0.2500 mg | INTRAMUSCULAR | Status: DC | PRN
  Administered 2019-08-28 (×2): 0.5 mg via INTRAVENOUS

## 2019-08-28 MED ORDER — LEVOTHYROXINE SODIUM 25 MCG PO TABS
125.0000 ug | ORAL_TABLET | Freq: Every day | ORAL | Status: DC
  Administered 2019-08-28: 125 ug via ORAL
  Filled 2019-08-28: qty 1

## 2019-08-28 SURGICAL SUPPLY — 47 items
ADH SKN CLS APL DERMABOND .7 (GAUZE/BANDAGES/DRESSINGS) ×2
APL PRP STRL LF DISP 70% ISPRP (MISCELLANEOUS) ×2
ATTRACTOMAT 16X20 MAGNETIC DRP (DRAPES) ×4 IMPLANT
BLADE CLIPPER SURG (BLADE) IMPLANT
CANISTER SUCT 3000ML PPV (MISCELLANEOUS) ×4 IMPLANT
CHLORAPREP W/TINT 26 (MISCELLANEOUS) ×4 IMPLANT
CLIP VESOCCLUDE MED 24/CT (CLIP) ×4 IMPLANT
CLIP VESOCCLUDE SM WIDE 24/CT (CLIP) ×4 IMPLANT
CONT SPEC 4OZ CLIKSEAL STRL BL (MISCELLANEOUS) IMPLANT
COVER SURGICAL LIGHT HANDLE (MISCELLANEOUS) ×4 IMPLANT
COVER WAND RF STERILE (DRAPES) ×4 IMPLANT
DERMABOND ADVANCED (GAUZE/BANDAGES/DRESSINGS) ×2
DERMABOND ADVANCED .7 DNX12 (GAUZE/BANDAGES/DRESSINGS) ×2 IMPLANT
DRAPE LAPAROTOMY 100X72 PEDS (DRAPES) ×4 IMPLANT
ELECT COATED BLADE 2.86 ST (ELECTRODE) ×4 IMPLANT
ELECT REM PT RETURN 9FT ADLT (ELECTROSURGICAL) ×4
ELECTRODE REM PT RTRN 9FT ADLT (ELECTROSURGICAL) ×2 IMPLANT
GAUZE 4X4 16PLY RFD (DISPOSABLE) ×4 IMPLANT
GAUZE SPONGE 4X4 12PLY STRL (GAUZE/BANDAGES/DRESSINGS) ×4 IMPLANT
GLOVE BIO SURGEON STRL SZ7.5 (GLOVE) ×4 IMPLANT
GOWN STRL REUS W/ TWL LRG LVL3 (GOWN DISPOSABLE) ×2 IMPLANT
GOWN STRL REUS W/ TWL XL LVL3 (GOWN DISPOSABLE) ×2 IMPLANT
GOWN STRL REUS W/TWL LRG LVL3 (GOWN DISPOSABLE) ×4
GOWN STRL REUS W/TWL XL LVL3 (GOWN DISPOSABLE) ×4
HEMOSTAT SURGICEL 2X4 FIBR (HEMOSTASIS) ×4 IMPLANT
ILLUMINATOR WAVEGUIDE N/F (MISCELLANEOUS) ×4 IMPLANT
KIT BASIN OR (CUSTOM PROCEDURE TRAY) ×4 IMPLANT
KIT TURNOVER KIT B (KITS) ×4 IMPLANT
NDL HYPO 25GX1X1/2 BEV (NEEDLE) ×2 IMPLANT
NEEDLE HYPO 25GX1X1/2 BEV (NEEDLE) ×4 IMPLANT
NS IRRIG 1000ML POUR BTL (IV SOLUTION) ×4 IMPLANT
PACK GENERAL/GYN (CUSTOM PROCEDURE TRAY) ×4 IMPLANT
PAD ARMBOARD 7.5X6 YLW CONV (MISCELLANEOUS) ×4 IMPLANT
PENCIL SMOKE EVACUATOR (MISCELLANEOUS) ×4 IMPLANT
POSITIONER HEAD DONUT 9IN (MISCELLANEOUS) ×4 IMPLANT
SHEARS HARMONIC 9CM CVD (BLADE) ×4 IMPLANT
SPECIMEN JAR MEDIUM (MISCELLANEOUS) ×4 IMPLANT
SPONGE INTESTINAL PEANUT (DISPOSABLE) ×4 IMPLANT
SUT MNCRL AB 4-0 PS2 18 (SUTURE) ×4 IMPLANT
SUT SILK 2 0 (SUTURE) ×4
SUT SILK 2-0 18XBRD TIE 12 (SUTURE) ×2 IMPLANT
SUT SILK 3 0 (SUTURE) ×4
SUT SILK 3-0 18XBRD TIE 12 (SUTURE) ×2 IMPLANT
SUT VIC AB 3-0 SH 18 (SUTURE) ×6 IMPLANT
SYR CONTROL 10ML LL (SYRINGE) ×4 IMPLANT
TOWEL GREEN STERILE (TOWEL DISPOSABLE) ×4 IMPLANT
TOWEL GREEN STERILE FF (TOWEL DISPOSABLE) ×4 IMPLANT

## 2019-08-28 NOTE — Progress Notes (Signed)
Inpatient Diabetes Program Recommendations  AACE/ADA: New Consensus Statement on Inpatient Glycemic Control (2015)  Target Ranges:  Prepandial:   less than 140 mg/dL      Peak postprandial:   less than 180 mg/dL (1-2 hours)      Critically ill patients:  140 - 180 mg/dL   Lab Results  Component Value Date   GLUCAP 125 (H) 08/28/2019   HGBA1C 10.1 (H) 08/21/2019    Review of Glycemic Control Results for Freese, Evaleen L (MRN [id number]) as of 08/28/2019 10:05  Ref. Range 08/28/2019 05:57 08/28/2019 09:26  Glucose-Capillary Latest Ref Range: 70 - 99 mg/dL 116 (H) 125 (H)   Diabetes history: DM 2 Outpatient Diabetes medications:  U500 110 units breakfast/ 100 units with dinner- Victoza 1.8 mg daily, Metformin 1000 mg bid Current orders for Inpatient glycemic control:  Currently in surgery and blood sugars within goal range  Inpatient Diabetes Program Recommendations:     -After surgery please add Novolog resistant q 4 hours. Patient did receive Decadron 10 mg which may increase blood sugars for 24 hours.   -Once diet restarted and patient eating, consider restarting 1/2 of home dose of U500- U500 55 units with breakfast and 50 units with dinner.   Thanks  Adah Perl, RN, BC-ADM Inpatient Diabetes Coordinator Pager [phone] (8a-5p)

## 2019-08-28 NOTE — Transfer of Care (Signed)
Immediate Anesthesia Transfer of Care Note  Patient: [name]  Procedure(s) Performed: RIGHT THYROIDECTOMY (Right ) Tracheal Esophageal Prosthesis (Tep) Change (N/A Neck)  Patient Location: PACU  Anesthesia Type:General  Level of Consciousness: awake, patient cooperative and responds to stimulation  Airway & Oxygen Therapy: Patient Spontanous Breathing and Patient connected to tracheostomy mask oxygen  Post-op Assessment: Report given to RN, Post -op Vital signs reviewed and stable and Patient moving all extremities X 4  Post vital signs: Reviewed and stable  Last Vitals:  Vitals Value Taken Time  BP 143/111 08/28/19 0927  Temp    Pulse 100 08/28/19 0933  Resp 18 08/28/19 0933  SpO2 92 % 08/28/19 0933  Vitals shown include unvalidated device data.  Last Pain:  Vitals:   08/28/19 0624  PainSc: 0-No pain         Complications: No apparent anesthesia complications

## 2019-08-28 NOTE — Progress Notes (Signed)
RT NOTE: RT sent A11 form to materials management for bedside trach and inner cannulas. RT will continue to monitor.

## 2019-08-28 NOTE — Op Note (Signed)
08/28/2019  9:16 AM  PATIENT:  [name]  43 y.o. female  PRE-OPERATIVE DIAGNOSIS:  THYROID NODULE  POST-OPERATIVE DIAGNOSIS:  THYROID NODULE  PROCEDURE:  Procedure(s): RIGHT THYROIDECTOMY (Right) Tracheal Esophageal Prosthesis (Tep) Change (N/A)  SURGEON:  Surgeon(s) and Role:    * Ralene Ok, MD - Primary    * Gerkin, Sherren Mocha, MD ( who was essential at help with exposure, and dissection of the anatomy due to the patient's exisiting tracheostomy.)  ANESTHESIA:   local and general  EBL:  minimal   BLOOD ADMINISTERED:none  DRAINS: none   LOCAL MEDICATIONS USED:  BUPIVICAINE   SPECIMEN:  Source of Specimen:  Right thyroid with stitch in the superior lobe  DISPOSITION OF SPECIMEN:  PATHOLOGY  COUNTS:  YES  TOURNIQUET:  * No tourniquets in log *  DICTATION: .Dragon Dictation  Complications: none   Counts: reported as correct x 2   Findings: tracheostomy in place, right thyroid nodule  Indications for procedure: The patient is a 43F who had a right [facility] and had a Bethesda 3.  She was counseled and consented to undergo right thyroidectomy.  Details of the procedure:  The patient was taken back to the operating room. The patient was placed in supine position with bilateral SCDs in place.  The patient was prepped and draped in the usual sterile fashion. After appropriate anitbiotics were confirmed, a time-out was confirmed and all facts were verified.   A 5 cm incision was made approximately just above the tracheostomy. Bovie cautery was used to maintain hemostasis dissection was carried down through the platysma. The platysma was elevated and flaps were created superiorly and inferiorly to the thyroid cartilage as well as the sternal notch, repsectively.  We dissected more to the right side of the tracheostomy to avoid making a skin flap in the middle. The strap muscles were identified in the midline and separated. Right-sided strap muscles  were elevated off the anterior surface of the thyroid. This dissection was carried laterally. The middle thyroid vein was identified and doubly ligated. We proceeded to dissect away the superior lobe and Kitners were used to gently dissect the surrounding musculature from the thyroid. The superior thyroid vessels were identified and doubly ligated with clips and transected with a Harmonic scalpel. At this time this freed up the superior lobe was able to deliver this into the wound. We also identified the superior parathyroid gland which we preserved.   We continued to dissect the thyroid off of the trachea from lateral to medial direction. The right recurrent laryngeal nerve was not identified, however dissection was kept right on the thyroid lobe.  The inferior thyroid vessels were identified ligated with clips. At this time Berry's ligament was dissected away from the trachea. This delivered the right lobe of the thyroid into the wound and the harmonic scalpel was used to divide the thyroid in the midline. A superior stitch was then placed in the superior thyroid lobe.   The area was irrigated out. The dissection bed was hemostatic. We placed fibrillar hemostatic agent into the wound. Strap muscles were then reapproximated in the midline with interrupted 3-0 Vicryl stitches. The platysma was reapproximated using 3-0 Vicryl stitches in interrupted fashion. Skin was then reapproximated using a running subcuticular 4-0 Monocryl. The skin was then dressed with Dermabond.  The tracheostomy was replaced with confirmation of end tidal CO2. The patient was taken to the recovery room in stable condition.    PLAN OF CARE:  Admit for overnight observation  PATIENT DISPOSITION:  PACU - hemodynamically stable.   Delay start of Pharmacological VTE agent (>24hrs) due to surgical blood loss or risk of bleeding: yes

## 2019-08-28 NOTE — Interval H&P Note (Signed)
History and Physical Interval Note:  08/28/2019 7:00 AM  [name]  has presented today for surgery, with the diagnosis of THYROID NODULE.  The various methods of treatment have been discussed with the patient and family. After consideration of risks, benefits and other options for treatment, the patient has consented to  Procedure(s): RIGHT THYROIDECTOMY (Right) as a surgical intervention.  The patient's history has been reviewed, patient examined, no change in status, stable for surgery.  I have reviewed the patient's chart and labs.  Questions were answered to the patient's satisfaction.     Ralene Ok

## 2019-08-28 NOTE — Consult Note (Signed)
[name], MRN:  [id number], DOB:  [birth year], LOS: 0 ADMISSION DATE:  08/28/2019, CONSULTATION DATE:  08/28/2019 REFERRING MD:  Dr. 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   History of present illness   [name] is a 43yo female with extensive PMX significant for but not limited to chronic tracheostomy dependence, diabetes, obesity, and hyperlipidemia who presented for elective right thyroidectomy. Patient is established in our [place] clinic and see Marni Griffon NP. PCCM consulted for post-op observation and care of trach.   Past Medical History  Type II diabetes  Sleep apnea Morbid obesity HX of intentional drug overdose Hypertension Hyperlipidemia Gastroparesis Depression Cushing disease  Significant Hospital Events   12/18 Admit under observation after elective right thyroidectomy  Consults:  PCCM  Procedures:  Right thyroidectomy 12/18  Significant Diagnostic Tests:    Micro Data:    Antimicrobials:  Vancomycin x1 periop   Interim history/subjective:  Sitting up in recliner in PACU with no acute complaints states pain is controlled and breathing is at baseline.   Objective   Blood pressure (!) 171/85, pulse 100, temperature (!) 97.5 F (36.4 C), resp. rate 17, height 5\' 2"  (1.575 m), weight 127 kg, SpO2 99 %.        Intake/Output Summary (Last 24 hours) at 08/28/2019 1136 Last data filed at 08/28/2019 0910 Gross per 24 hour  Intake 1000 ml  Output --  Net 1000 ml   Filed Weights   08/28/19 0552  Weight: 127 kg    Examination: General: Very pleasant adult female on ATC through chronic trach, in NAD HEENT: Chronic trach, MM pink/moist, PERRL,  Neuro: Alert and oriented able to communicate needs, non-focal  CV: s1s2 regular rate and  rhythm, no murmur, rubs, or gallops,  PULM:  Slightly diminished bilaterally, no added breath sounds or increased work of breathing  GI: soft, bowel sounds active in all 4 quadrants, non-tender, non-distended Extremities: warm/dry, no edema  Skin: no rashes or lesions  Resolved Hospital Problem list     Assessment & Plan:  Chronic tracheostomy with HX of OSA -History of chronic tracheostomy dependence since 2017 with HX of OHS is established in trach clinic with Marni Griffon last seen in clinic 08/19/2019 -Baseline trach is an uncuffed 55mm [place] Plan: Continue routine trach care  Remain on ATC Follow up in trach clinic upon discharge  Encourage frequent pulmomary hygiene  Remains on home trach size  Patient remains very stable post operatively, she is stable to be observed overnight in progressive care, no current indications for ICU monitoring at this time   Elective right thyroidectomy  Plan: Care per general surgery   Management of chronic medical conditions per primary   Best practice:  Diet: Per surgery  Pain/Anxiety/Delirium protocol (if indicated): PRN Diludid per surgery  VAP protocol (if indicated): N/A DVT prophylaxis: per general surgery  GI prophylaxis: N/A Glucose control: SSI Mobility: Up with assistance  Code Status: Full Family Communication:  Per primary  Disposition: Progressive care   Labs   CBC: Recent Labs  Lab 08/28/19 0646  HGB 11.9*  HCT 35.0*    Basic Metabolic Panel: Recent Labs  Lab 08/28/19 0646  NA 138  K 4.3  CL 103  GLUCOSE 142*  BUN 14  CREATININE 0.70   GFR: Estimated  Creatinine Clearance: 114.6 mL/min (by C-G formula based on SCr of 0.7 mg/dL). No results for input(s): PROCALCITON, WBC, LATICACIDVEN in the last 168 hours.  Liver Function Tests: No results for input(s): AST, ALT, ALKPHOS, BILITOT, PROT, ALBUMIN in the last 168 hours. No results for input(s): LIPASE, AMYLASE in the last 168 hours. No results for  input(s): AMMONIA in the last 168 hours.  ABG    Component Value Date/Time   PHART 7.420 12/20/2017 0421   PCO2ART 43.3 12/20/2017 0421   PO2ART 103 12/20/2017 0421   HCO3 27.5 12/20/2017 0421   TCO2 25 08/28/2019 0646   ACIDBASEDEF 0.8 11/22/2017 0257   O2SAT 97.9 12/20/2017 0421     Coagulation Profile: No results for input(s): INR, PROTIME in the last 168 hours.  Cardiac Enzymes: No results for input(s): CKTOTAL, CKMB, CKMBINDEX, TROPONINI in the last 168 hours.  HbA1C: Hgb A1c MFr Bld  Date/Time Value Ref Range Status  08/21/2019 10:28 AM 10.1 (H) 4.8 - 5.6 % Final    Comment:    (NOTE) Pre diabetes:          5.7%-6.4% Diabetes:              >6.4% Glycemic control for   <7.0% adults with diabetes   11/20/2017 02:10 PM 7.2 (H) 4.8 - 5.6 % Final    Comment:    (NOTE) Pre diabetes:          5.7%-6.4% Diabetes:              >6.4% Glycemic control for   <7.0% adults with diabetes     CBG: Recent Labs  Lab 08/28/19 0557 08/28/19 0926  GLUCAP 116* 125*    Review of Systems: Bold in positive  Gen: Denies fever, chills, weight change, fatigue, night sweats HEENT: Denies blurred vision, double vision, hearing loss, tinnitus, sinus congestion, rhinorrhea, sore throat, neck stiffness, dysphagia PULM: Denies shortness of breath, cough, sputum production, hemoptysis, wheezing CV: Denies chest pain, edema, orthopnea, paroxysmal nocturnal dyspnea, palpitations GI: Denies abdominal pain, nausea, vomiting, diarrhea, hematochezia, melena, constipation, change in bowel habits GU: Denies dysuria, hematuria, polyuria, oliguria, urethral discharge Endocrine: Denies hot or cold intolerance, polyuria, polyphagia or appetite change Derm: Denies rash, dry skin, scaling or peeling skin change Heme: Denies easy bruising, bleeding, bleeding gums Neuro: Denies headache, numbness, weakness, slurred speech, loss of memory or consciousness  Past Medical History  She,  has a past  medical history of Anxiety, Complication of anesthesia, Cushing's disease ([place]), Depression, Diabetes ([place]), Gastroparesis, Hyperlipidemia, Hyperlipidemia, Hypertension, Intentional drug overdose ([place]), Morbid obesity ([place]), Osteoporosis (07/19/2015), Other cervical disc degeneration, unspecified cervical region, Periprosthetic fracture 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[place]), R tibial plateau  (07/18/2015), Sleep apnea, Uncontrolled diabetes mellitus with diabetic neuropathy, with long-term current use of insulin ([place]) (07/16/2015), and Vitamin D deficiency (07/19/2015).   Surgical History    Past Surgical History:  Procedure Laterality Date  . ANTERIOR TALOFIBULAR LIGAMENT REPAIR Left 11/15/2014   Procedure: ANTERIOR TALOFIBULAR LIGAMENT REPAIR;  Surgeon: Jana Half, DPM;  Location: [facility];  Service: Podiatry;  Laterality: Left;  . CESAREAN SECTION  dec 1997/  06-03-2001/   01-01-2005   BILATERAL TUBAL LIGATION WITH LAST ONE  . DILATION AND CURETTAGE OF UTERUS  1995   WITH SUCTION  . DIRECT LARYNGOSCOPY N/A 12/18/2017   Procedure: DIRECT LARYNGOSCOPY;  Surgeon: Izora Gala, MD;  Location: [facility];  Service: ENT;  Laterality: N/A;  . ESOPHAGOGASTRODUODENOSCOPY (EGD) WITH PROPOFOL N/A 10/04/2016  Procedure: ESOPHAGOGASTRODUODENOSCOPY (EGD) WITH PROPOFOL;  Surgeon: [name], MD;  Location: [facility];  Service: Endoscopy;  Laterality: N/A;  . LAPAROSCOPIC CHOLECYSTECTOMY  09-25-2005  . ORIF TIBIA PLATEAU Right 07/19/2015   Procedure: OPEN REDUCTION INTERNAL FIXATION (ORIF) RIGHT TIBIAL PLATEAU;  Surgeon: Altamese Gunbarrel, MD;  Location: [facility];  Service: Orthopedics;  Laterality: Right;  . PARTIAL KNEE ARTHROPLASTY Right 04/19/2014   Procedure: RIGHT UNI KNEE ARTHROPLASTY MEDIALLY ;  Surgeon: Mauri Pole, MD;  Location: [facility];  Service: Orthopedics;  Laterality: Right;  . TRACHEOSTOMY TUBE PLACEMENT N/A 11/28/2017   Procedure: TRACHEOSTOMY;  Surgeon: Izora Gala, MD;   Location: [facility];  Service: ENT;  Laterality: N/A;  . TUBAL LIGATION       Social History   reports that she quit smoking about 2 years ago. She quit after 4.00 years of use. She has never used smokeless tobacco. She reports current alcohol use. She reports that she does not use drugs.   Family History   Her family history includes ADD / ADHD in her son; Apraxia in her son; Autism in her son. She was adopted.   Allergies Allergies  Allergen Reactions  . Penicillins Hives and Other (See Comments)    Tolerated zosyn 12/12/17 Did it involve swelling of the face/tongue/throat, SOB, or low BP? No Did it involve sudden or severe rash/hives, skin peeling, or any reaction on the inside of your mouth or nose? Yes Did you need to seek medical attention at a hospital or doctor's office? Yes When did it last happen?10+ Years If all above answers are "NO", may proceed with cephalosporin use.   . Sulfa Antibiotics Rash     Home Medications  Prior to Admission medications   Medication Sig Start Date End Date Taking? Authorizing Provider  atorvastatin (LIPITOR) 20 MG tablet Take 20 mg by mouth at bedtime.   Yes [provider]  azelastine (ASTELIN) 0.1 % nasal spray Place 1 spray into both nostrils daily as needed for rhinitis. Use in each nostril as directed    Yes [provider]  cariprazine (VRAYLAR) capsule Take 3 mg by mouth at bedtime.    Yes [provider]  celecoxib (CELEBREX) 200 MG capsule Take 200 mg by mouth 2 (two) times daily as needed for mild pain.    Yes [provider]  cholestyramine (QUESTRAN) 4 g packet Take 1 packet (4 g total) by mouth daily. Patient taking differently: Take 4 g by mouth every other day.  06/10/19  Yes Zehr, Laban Emperor, PA-C  fluticasone (FLONASE) 50 MCG/ACT nasal spray Place 1 spray into both nostrils as needed for allergies or rhinitis.   Yes [provider]  gabapentin (NEURONTIN) 600 MG tablet Take 600 mg by  mouth 2 (two) times daily.    Yes [provider]  hydrOXYzine (VISTARIL) 50 MG capsule Take 50 mg by mouth at bedtime.   Yes [provider]  insulin regular human CONCENTRATED (HUMULIN R) 500 UNIT/ML kwikpen Inject 100-110 Units into the skin See admin instructions. Inject 110 units into the skin with breakfast and 100 units with dinner 04/20/19  Yes [provider]  lamoTRIgine (LAMICTAL) 25 MG tablet Take 25 mg by mouth daily.   Yes [provider]  levothyroxine (SYNTHROID, LEVOTHROID) 125 MCG tablet Take 125 mcg by mouth at bedtime.    Yes [provider]  liraglutide (VICTOZA) 18 MG/3ML SOPN Inject 1.8 mg into the skin daily after breakfast. Dx Code: [id number] 12/31/18  Yes [provider]  metFORMIN (GLUCOPHAGE) 1000 MG tablet Take 1,000 mg by mouth 2 (two) times daily with a meal.   Yes [provider]  pantoprazole (PROTONIX) 40 MG tablet TAKE ONE TABLET BY MOUTH TWICE A DAY BEFORE MEALS Patient taking differently: Take 40 mg by mouth daily before breakfast.  04/01/17  Yes [name], MD  topiramate (TOPAMAX) 25 MG tablet Take 25 mg by mouth 2 (two) times daily.   Yes [provider]  traMADol (ULTRAM) 50 MG tablet Take 50 mg by mouth every 6 (six) hours as needed for moderate pain.    Yes [provider]  traZODone (DESYREL) 150 MG tablet Take 150 mg by mouth at bedtime.   Yes [provider]     Critical care time:    Johnsie Cancel, NP-C [facility] Contact / Pager information can be found on Amion  08/28/2019, 12:28 PM

## 2019-08-28 NOTE — Anesthesia Postprocedure Evaluation (Signed)
Anesthesia Post Note  Patient: [name]  Procedure(s) Performed: RIGHT THYROIDECTOMY (Right ) Tracheal Esophageal Prosthesis (Tep) Change (N/A Neck)     Patient location during evaluation: PACU Anesthesia Type: General Level of consciousness: awake and alert Pain management: pain level controlled Vital Signs Assessment: post-procedure vital signs reviewed and stable Respiratory status: spontaneous breathing, nonlabored ventilation and respiratory function stable Cardiovascular status: blood pressure returned to baseline and stable Postop Assessment: no apparent nausea or vomiting Anesthetic complications: no    Last Vitals:  Vitals:   08/28/19 1059 08/28/19 1114  BP: (!) 166/97 (!) 171/85  Pulse: 96 100  Resp: 17 17  Temp:    SpO2: 98% 99%    Last Pain:  Vitals:   08/28/19 1027  PainSc: 2                  Lynda Rainwater

## 2019-08-28 NOTE — Anesthesia Procedure Notes (Signed)
Procedure Name: Intubation Date/Time: 08/28/2019 7:41 AM Performed by: Verdie Drown, CRNA Pre-anesthesia Checklist: Patient identified, Emergency Drugs available, Suction available and Patient being monitored Patient Re-evaluated:Patient Re-evaluated prior to induction Oxygen Delivery Method: Circle system utilized Preoxygenation: Pre-oxygenation with 100% oxygen (via 6.0 uncuffed trach) Induction Type: IV induction and Inhalational induction Laryngoscope Size: Mac and 3 Grade View: Grade I Tube type: Oral Number of attempts: 1 Airway Equipment and Method: Oral airway,  Video-laryngoscopy and Rigid stylet Placement Confirmation: ETT inserted through vocal cords under direct vision,  positive ETCO2 and breath sounds checked- equal and bilateral Secured at: 22 cm Tube secured with: Tape Dental Injury: Teeth and Oropharynx as per pre-operative assessment

## 2019-08-29 DIAGNOSIS — D34 Benign neoplasm of thyroid gland: Secondary | ICD-10-CM | POA: Diagnosis not present

## 2019-08-29 LAB — GLUCOSE, CAPILLARY
Glucose-Capillary: 249 mg/dL — ABNORMAL HIGH (ref 70–99)
Glucose-Capillary: 237 mg/dL — ABNORMAL HIGH (ref 70–99)

## 2019-08-29 MED ORDER — HYDROCODONE-ACETAMINOPHEN 5-325 MG PO TABS: 1.0000 | ORAL_TABLET | Freq: Four times a day (QID) | ORAL | 0 refills | Status: AC | PRN

## 2019-08-29 NOTE — Discharge Instructions (Signed)
[facility]      [facility] °[phone] ° °THYROID/ PARATHYROID SURGERY: POST OP INSTRUCTIONS ° °Always review your discharge instruction sheet given to you by the facility where your surgery was performed. ° °IF YOU HAVE DISABILITY OR FAMILY LEAVE FORMS, YOU MUST BRING THEM TO THE OFFICE FOR PROCESSING.  PLEASE DO NOT GIVE THEM TO YOUR DOCTOR. ° °1. A prescription for pain medication may be given to you upon discharge.  Take your pain medication as prescribed, if needed.  If narcotic pain medicine is not needed, then you may take acetaminophen (Tylenol) or ibuprofen (Advil) as needed. °2. Take your usually prescribed medications unless otherwise directed. °3. If you need a refill on your pain medication, please contact your pharmacy. They will contact our office to request authorization.  Prescriptions will not be filled after 5pm or on week-ends. °4. You should follow a light diet the first 24 hours after arrival home, such as soup and crackers, etc.  Be sure to include lots of fluids daily.  Resume your normal diet the day after surgery. °5. Most patients will experience some swelling and bruising on the chest and neck area.  Ice packs will help.  Swelling and bruising can take several days to resolve.  °6. It is common to experience some constipation if taking pain medication after surgery.  Increasing fluid intake and taking a stool softener will usually help or prevent this problem from occurring.  A mild laxative (Milk of Magnesia or Miralax) should be taken according to package directions if there are no bowel movements after 48 hours. °7. Unless discharge instructions indicate otherwise, you may remove your bandages 24-48 hours after surgery, and you may shower at that time.  You may have steri-strips (small skin tapes) in place directly over the incision.  These strips should be left on the skin for 7-10 days.  If your surgeon used skin glue on the incision, you may shower in 24 hours.  The  glue will flake off over the next 2-3 weeks.  Any sutures or staples will be removed at the office during your follow-up visit. °8. ACTIVITIES:  You may resume regular (light) daily activities beginning the next day--such as daily self-care, walking, climbing stairs--gradually increasing activities as tolerated.  You may have sexual intercourse when it is comfortable.  Refrain from any heavy lifting or straining until approved by your doctor. °a. You may drive when you no longer are taking prescription pain medication, you can comfortably wear a seatbelt, and you can safely maneuver your car and apply brakes °b. RETURN TO WORK:  __________________________________________________________ °9. You should see your doctor in the office for a follow-up appointment approximately two weeks after your surgery.  Make sure that you call for this appointment within a day or two after you arrive home to insure a convenient appointment time. °10. OTHER INSTRUCTIONS: ____________________________________________________________________________ _________________________________________________________________________________________________________________ °_________________________________________________________________________________________________________________ ° ° °WHEN TO CALL YOUR DOCTOR: °1. Fever over 101.0 °2. Inability to urinate °3. Nausea and/or vomiting °4. Extreme swelling or bruising °5. Continued bleeding from incision. °6. Increased pain, redness, or drainage from the incision. °7. Difficulty swallowing or breathing °8. Muscle cramping or spasms. °9. Numbness or tingling in hands or feet or around lips. ° °The clinic staff is available to answer your questions during regular business hours.  Please don’t hesitate to call and ask to speak to one of the nurses if you have concerns. ° °For further questions, please visit [email or web address] °

## 2019-08-29 NOTE — Progress Notes (Signed)
  1 Day Post-Op  Subjective: Complains only of soreness  Objective: Vital signs in last 24 hours: Temp:  [97.5 F (36.4 C)-98.3 F (36.8 C)] 98.2 F (36.8 C) (12/19 0609) Pulse Rate:  [94-123] 105 (12/19 0741) Resp:  [15-20] 18 (12/19 0741) BP: (118-175)/(54-105) 119/54 (12/19 0609) SpO2:  [94 %-99 %] 94 % (12/19 0741) FiO2 (%):  [28 %] 28 % (12/19 0450) Weight:  [133.1 kg] 133.1 kg (12/19 0609)    Intake/Output from previous day: 12/18 0701 - 12/19 0700 In: 1000 [I.V.:1000] Out: -  Intake/Output this shift: No intake/output data recorded.  General appearance: alert and cooperative Neck: incision ok. voice strong Resp: clear to auscultation bilaterally and trached Cardio: regular rate and rhythm GI: soft, non-tender; bowel sounds normal; no masses,  no organomegaly  Lab Results:  Recent Labs    08/28/19 0646  HGB 11.9*  HCT 35.0*   BMET Recent Labs    08/28/19 0646  NA 138  K 4.3  CL 103  GLUCOSE 142*  BUN 14  CREATININE 0.70   PT/INR No results for input(s): LABPROT, INR in the last 72 hours. ABG No results for input(s): PHART, HCO3 in the last 72 hours.  Invalid input(s): PCO2, PO2  Studies/Results: No results found.  Anti-infectives: Anti-infectives (From admission, onward)   Start     Dose/Rate Route Frequency Ordered Stop   08/28/19 0600  vancomycin (VANCOREADY) IVPB 1500 mg/300 mL     1,500 mg 150 mL/hr over 120 Minutes Intravenous To [facility] 08/27/19 1442 08/28/19 0942      Assessment/Plan: s/p Procedure(s): RIGHT THYROIDECTOMY Tracheal Esophageal Prosthesis (Tep) Change Advance diet Discharge  LOS: 0 days    Autumn Messing III 08/29/2019

## 2019-08-31 LAB — SURGICAL PATHOLOGY

## 2019-09-02 NOTE — Discharge Summary (Signed)
Physician Discharge Summary  Patient ID: [name] MRN: [id number] DOB/AGE: 02/09/1976 43 y.o.  Admit date: 08/28/2019 Discharge date: 09/02/2019  Admission Diagnoses: Thyroid nodule  Discharge Diagnoses:  Active Problems:   S/P thyroidectomy   S/P partial thyroidectomy   Discharged Condition: good  Hospital Course: Patient is a 43 year old female who underwent thyroidectomy secondary to thyroid nodule.  Please see operative note for full details.  Patient tolerated this well.  Patient was sent to the floor.  She had good pain control.  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  She had been afebrile throughout her hospital stay.  Had good pain control.  Had no difficulty with respiration.  Patient was deemed stable for discharge and discharged home.    Consults: pulmonary/intensive care  Significant Diagnostic Studies: None  Treatments: surgery: As per above  Discharge Exam: Blood pressure 117/67, pulse (!) 104, temperature 97.8 F (36.6 C), temperature source Oral, resp. rate 20, height 5\' 2"  (1.575 m), weight 133.1 kg, SpO2 96 %. General appearance: alert and cooperative Throat: normal findings: Incisions are clean dry and intact and Tracheostomy in place  Disposition: Discharge disposition: 01-Home or Self Care       Discharge Instructions    Call MD for:  difficulty breathing, headache or visual disturbances   Complete by: As directed    Call MD for:  difficulty breathing, headache or visual disturbances   Complete by: As directed    Call MD for:  extreme fatigue   Complete by: As directed    Call MD for:  hives   Complete by: As directed    Call MD for:  hives   Complete by: As directed    Call MD for:  persistant dizziness or light-headedness   Complete by: As directed    Call MD for:  persistant nausea and vomiting   Complete by: As directed    Call MD for:  persistant nausea and vomiting   Complete by: As directed    Call MD for:   redness, tenderness, or signs of infection (pain, swelling, redness, odor or green/yellow discharge around incision site)   Complete by: As directed    Call MD for:  redness, tenderness, or signs of infection (pain, swelling, redness, odor or green/yellow discharge around incision site)   Complete by: As directed    Call MD for:  severe uncontrolled pain   Complete by: As directed    Call MD for:  severe uncontrolled pain   Complete by: As directed    Call MD for:  temperature >100.4   Complete by: As directed    Call MD for:  temperature >100.4   Complete by: As directed    Diet - low sodium heart healthy   Complete by: As directed    Diet - low sodium heart healthy   Complete by: As directed    Discharge instructions   Complete by: As directed    May shower. Soft diet. Routine trach care   Increase activity slowly   Complete by: As directed    Increase activity slowly   Complete by: As directed    No wound care   Complete by: As directed      Allergies as of 08/29/2019      Reactions   Penicillins Hives, Other (See Comments)   Tolerated zosyn 12/12/17 Did it involve swelling of the face/tongue/throat, SOB, or low BP? No Did it involve sudden or severe rash/hives, skin peeling, or any reaction on the  inside of your mouth or nose? Yes Did you need to seek medical attention at a hospital or doctor's office? Yes When did it last happen?10+ Years If all above answers are "NO", may proceed with cephalosporin use.   Sulfa Antibiotics Rash      Medication List    TAKE these medications   atorvastatin 20 MG tablet Commonly known as: LIPITOR Take 20 mg by mouth at bedtime.   azelastine 0.1 % nasal spray Commonly known as: ASTELIN Place 1 spray into both nostrils daily as needed for rhinitis. Use in each nostril as directed   celecoxib 200 MG capsule Commonly known as: CELEBREX Take 200 mg by mouth 2 (two) times daily as needed for mild pain.   cholestyramine 4 g  packet Commonly known as: Questran Take 1 packet (4 g total) by mouth daily. What changed: when to take this   fluticasone 50 MCG/ACT nasal spray Commonly known as: FLONASE Place 1 spray into both nostrils as needed for allergies or rhinitis.   gabapentin 600 MG tablet Commonly known as: NEURONTIN Take 600 mg by mouth 2 (two) times daily.   HYDROcodone-acetaminophen 5-325 MG tablet Commonly known as: NORCO/VICODIN Take 1-2 tablets by mouth every 6 (six) hours as needed for moderate pain.   hydrOXYzine 50 MG capsule Commonly known as: VISTARIL Take 50 mg by mouth at bedtime.   insulin regular human CONCENTRATED 500 UNIT/ML kwikpen Commonly known as: HUMULIN R Inject 100-110 Units into the skin See admin instructions. Inject 110 units into the skin with breakfast and 100 units with dinner   lamoTRIgine 25 MG tablet Commonly known as: LAMICTAL Take 25 mg by mouth daily.   levothyroxine 125 MCG tablet Commonly known as: SYNTHROID Take 125 mcg by mouth at bedtime.   metFORMIN 1000 MG tablet Commonly known as: GLUCOPHAGE Take 1,000 mg by mouth 2 (two) times daily with a meal.   pantoprazole 40 MG tablet Commonly known as: PROTONIX TAKE ONE TABLET BY MOUTH TWICE A DAY BEFORE MEALS What changed: See the new instructions.   topiramate 25 MG tablet Commonly known as: TOPAMAX Take 25 mg by mouth 2 (two) times daily.   traMADol 50 MG tablet Commonly known as: ULTRAM Take 50 mg by mouth every 6 (six) hours as needed for moderate pain.   traZODone 150 MG tablet Commonly known as: DESYREL Take 150 mg by mouth at bedtime.   Victoza 18 MG/3ML Sopn Generic drug: liraglutide Inject 1.8 mg into the skin daily after breakfast. Dx Code: E11.649   Vraylar capsule Generic drug: cariprazine Take 3 mg by mouth at bedtime.      Follow-up Information    Ralene Ok, MD. Schedule an appointment as soon as possible for a visit in 2 weeks.   Specialty: General Surgery Why:  Post op visit Contact information: [place] [address] [phone]           Signed: Ralene Ok 09/02/2019, 9:55 AM

## 2019-09-22 ENCOUNTER — Telehealth: Payer: Self-pay

## 2019-09-22 NOTE — Telephone Encounter (Signed)
-----   Message from Timothy Lasso, RN sent at 07/23/2019 10:43 AM EST ----- Pt needs colon rescheduled see phone note from 11/12

## 2019-09-22 NOTE — Telephone Encounter (Signed)
Dr Ardis Hughs can you review and let me know what category this pt would be? Timing of case.

## 2019-09-23 NOTE — Telephone Encounter (Signed)
Category 3

## 2019-09-23 NOTE — Telephone Encounter (Signed)
I will put the pt on my wait list

## 2019-10-12 DEATH — deceased

## 2019-11-02 ENCOUNTER — Other Ambulatory Visit: Payer: Self-pay

## 2019-11-02 ENCOUNTER — Telehealth: Payer: Self-pay

## 2019-11-02 DIAGNOSIS — K529 Noninfective gastroenteritis and colitis, unspecified: Secondary | ICD-10-CM

## 2019-11-02 NOTE — Telephone Encounter (Signed)
Colon scheduled for 3/25 at [facility] with Dr Ardis Hughs COVID test scheduled for 11/30/19 at 1010 am.

## 2019-11-03 NOTE — Telephone Encounter (Signed)
Left message on husbands voice mail to have pt return call.  Tried to call pt number that is listed in Epic but line rings a fast busy.

## 2019-11-03 NOTE — Telephone Encounter (Signed)
I have cancelled the appts due to pt deceased.

## 2019-11-30 ENCOUNTER — Other Ambulatory Visit (HOSPITAL_COMMUNITY): Payer: Medicaid Other

## 2019-12-03 ENCOUNTER — Ambulatory Visit (HOSPITAL_COMMUNITY): Admit: 2019-12-03 | Payer: Medicaid Other | Admitting: Gastroenterology

## 2019-12-03 SURGERY — COLONOSCOPY WITH PROPOFOL
Anesthesia: Monitor Anesthesia Care

## 2021-07-26 ENCOUNTER — Encounter

## 2021-07-26 ENCOUNTER — Inpatient Hospital Stay: Admit: 2021-07-26 | Payer: PRIVATE HEALTH INSURANCE

## 2021-07-26 DIAGNOSIS — Z1231 Encounter for screening mammogram for malignant neoplasm of breast: Secondary | ICD-10-CM

## 2021-09-26 NOTE — Progress Notes (Signed)
[name] [name]    Age 46 y.o.    female    [birth year]    MRN [id number]    10/31/2021  Arrival Time_____________  OR Time____________85 Min     Procedure(s):  DILATATION AND CURETTAGE, VIDEO  HYSTEROSCOPYAND NOVASURE ABLATION                      General   Surgeon(s):  Darlina Rumpf, MD      DAY ADMIT ___  SDS/OP ___  OUTPT IN BED ___        Phone [phone] (home) [phone] (work)    PCP _____________________ Phone_________________ Josefina Do ( ) Epic CE ( ) Appt ________    ADDITIONAL INFO __________________________________ Cardio/Consult _____________    NOTES _____________________________________________________________________    ____________________________________________________________________________    PAT APPT DATE:________ TIME: ________  FAXED QAD: _______  (__) H&P w/ Hospitalist  __________________________________________________________________________  Preop Nurse phone screen complete: _____________  (__) CBC     (__) W/ DIFF ___________     (__) Hgb A1C    ___________  (__) CHEST X RAY   __________  (__) LIPID PROFILE  ___________  (__) EKG   __________  (__) PT/PTT   ___________  (__) PFT's   __________  (__) BMP   ___________  (__) CAROTIDS  __________  (__) CMP   ___________  (__) VEIN MAPPING  __________  (__) U/A   ___________  (__) HISTORY & PHYSICAL __________  (__) URINE C & S  ___________  (__) CARDIAC CLEARANCE __________  (__) U/A W/ FLEX  ___________  (__) PULM. CLEARANCE __________  (__) SERUM PREGNANCY ___________  (__) Check Epic DOS orders __________  (__) TYPE & SCREEN __________repeat ( ) (__)  __________________ __________  (__) ALBUMIN   ___________  (__)  __________________ __________  (__) TRANSFERRIN  ___________  (__)  __________________ __________  (__) LIVER PROFILE  ___________  (__)  __________________ __________  (__) MRSA NASAL SWAB ___________  (__) URINE PREG DOS __________  (__) SED RATE  ___________  (__) BLOOD SUGAR DOS __________  (__) C-REACTIVE  PROTEIN ___________    (__) VITAMIN D HYDROXY ___________  (__) BLOOD THINNERS __________    (__) ACE/ ARBS: _____________________     (__) BETABLOCKERS __________________

## 2021-10-27 NOTE — Progress Notes (Signed)
[name] [name]   Date and time of surgery :0830  Arrival Time:0630   1. Do not eat or drink anything after 12 midnight (or  hours) prior to surgery. This includes no water, chewing gum or mints.   2. Take the following pills will a small sip of water on the morning of surgery none.   3. Aspirin, Ibuprofen, Advil, Naproxen, Vitamin E and other Anti-inflammatory products should be stopped for 5 days before surgery or as directed by your physician.   4. Check with your Doctor regarding stopping Plavix, Coumadin, Lovenox, Fragmin or other blood thinners   5. Do not smoke, and do not drink any alcoholic beverages 24 hours prior to surgery.  This includes NA Beer.   6. You may brush your teeth and gargle the morning of surgery.  DO NOT SWALLOW WATER.   7. You MUST make arrangements for a responsible adult to take you home after your surgery. You will not be allowed to leave alone or drive yourself home.  It is strongly suggested someone stay with you the first 24 hrs. Your surgery will be cancelled if you do not have a ride home.   8. A parent/legal guardian must accompany a child scheduled for surgery and plan to stay at the hospital until the child is discharged.  Please do not bring other children with you.   9. Please wear simple, loose fitting clothing to the hospital.  Do not bring valuables (money, credit cards, checkbooks, etc.) Do not wear any makeup (including no eye makeup) or nail polish on your fingers or toes.   10. DO NOT wear any jewelry or piercings on day of surgery.  All body piercing jewelry must be removed.   11. If you have dentures, they will be removed before going to the OR; we will provide you a container.  If you wear contact lenses or glasses, they will be removed; please bring a case for them.   12. Please see your family doctor/pediatrician for a history & physical and/or concerning medications.  Bring any test results/reports from your physician's office.   13. Remember to bring Blood Bank  bracelet to the hospital on the day of surgery.   14. If you have a Living Will and Durable Power of Attorney for Healthcare, please bring in a copy.   15. Notify your Surgeon if you develop any illness between now and surgery  time, cough, cold, fever, sore throat, nausea, vomiting, etc.  Please notify your surgeon if you experience dizziness, shortness of breath or blurred vision between now & the time of your surgery   16. DO NOT shave your operative site 96 hours prior to surgery. For face & neck surgery, men may use an electric razor 48 hours prior to surgery.   17. Shower the night before surgery with Antibacterial soap Hibiclens   18. To provide excellent care visitors will be limited to one in the room at any given time.               19.  Please bring picture ID and insurance card.    20.  Visit our web site for additional information:  [email or web address].          *Please call pre admission testing if you any further questions   Dareen Piano         [phone]   [place] [phone]   Vickii Chafe            [phone]    [place]  [phone]   [place]

## 2021-10-31 ENCOUNTER — Inpatient Hospital Stay: Payer: PRIVATE HEALTH INSURANCE

## 2021-10-31 LAB — CBC
Hematocrit: 38.9 % (ref 36.0–48.0)
Hemoglobin: 12.6 g/dL (ref 12.0–16.0)
MCH: 27.6 pg (ref 26.0–34.0)
MCHC: 32.4 g/dL (ref 31.0–36.0)
MCV: 85.2 fL (ref 80.0–100.0)
MPV: 6.7 fL (ref 5.0–10.5)
Platelets: 311 10*3/uL (ref 135–450)
RBC: 4.57 M/uL (ref 4.00–5.20)
RDW: 20.1 % — ABNORMAL HIGH (ref 12.4–15.4)
WBC: 10.5 10*3/uL (ref 4.0–11.0)

## 2021-10-31 LAB — POC PREGNANCY UR-QUAL: Pregnancy, Urine: NEGATIVE

## 2021-10-31 MED ORDER — LIDOCAINE HCL (PF) 1 % IJ SOLN
1 % | Freq: Once | INTRAMUSCULAR | Status: DC | PRN
Start: 2021-10-31 — End: 2021-10-31

## 2021-10-31 MED ORDER — NORMAL SALINE FLUSH 0.9 % IV SOLN
0.9 % | Freq: Two times a day (BID) | INTRAVENOUS | Status: DC
Start: 2021-10-31 — End: 2021-10-31

## 2021-10-31 MED ORDER — HYDROMORPHONE HCL 1 MG/ML IJ SOLN
1 MG/ML | INTRAMUSCULAR | Status: DC | PRN
Start: 2021-10-31 — End: 2021-10-31

## 2021-10-31 MED ORDER — ONDANSETRON HCL 4 MG/2ML IJ SOLN
4 MG/2ML | INTRAMUSCULAR | Status: DC | PRN
Start: 2021-10-31 — End: 2021-10-31
  Administered 2021-10-31: 14:00:00 4 via INTRAVENOUS

## 2021-10-31 MED ORDER — FENTANYL CITRATE (PF) 100 MCG/2ML IJ SOLN
100 MCG/2ML | INTRAMUSCULAR | Status: DC | PRN
Start: 2021-10-31 — End: 2021-10-31
  Administered 2021-10-31 (×2): 50 via INTRAVENOUS

## 2021-10-31 MED ORDER — NORMAL SALINE FLUSH 0.9 % IV SOLN
0.9 % | INTRAVENOUS | Status: DC | PRN
Start: 2021-10-31 — End: 2021-10-31

## 2021-10-31 MED ORDER — LIDOCAINE HCL (PF) 2 % IJ SOLN
2 % | INTRAMUSCULAR | Status: DC | PRN
Start: 2021-10-31 — End: 2021-10-31
  Administered 2021-10-31: 14:00:00 100 via INTRAVENOUS

## 2021-10-31 MED ORDER — MIDAZOLAM HCL 2 MG/2ML IJ SOLN
2 MG/ML | INTRAMUSCULAR | Status: DC | PRN
Start: 2021-10-31 — End: 2021-10-31
  Administered 2021-10-31: 14:00:00 2 via INTRAVENOUS

## 2021-10-31 MED ORDER — ONDANSETRON HCL 4 MG/2ML IJ SOLN
4 MG/2ML | Freq: Once | INTRAMUSCULAR | Status: AC | PRN
Start: 2021-10-31 — End: 2021-10-31
  Administered 2021-10-31: 15:00:00 4 mg via INTRAVENOUS

## 2021-10-31 MED ORDER — SODIUM CHLORIDE 0.9 % IV SOLN
0.9 % | INTRAVENOUS | Status: DC | PRN
Start: 2021-10-31 — End: 2021-10-31

## 2021-10-31 MED ORDER — LACTATED RINGERS IV SOLN
INTRAVENOUS | Status: DC
Start: 2021-10-31 — End: 2021-10-31

## 2021-10-31 MED ORDER — DEXAMETHASONE SODIUM PHOSPHATE 20 MG/5ML IJ SOLN
20 MG/5ML | INTRAMUSCULAR | Status: DC | PRN
Start: 2021-10-31 — End: 2021-10-31
  Administered 2021-10-31: 14:00:00 8 via INTRAVENOUS

## 2021-10-31 MED ORDER — LACTATED RINGERS IR SOLN
Status: DC | PRN
Start: 2021-10-31 — End: 2021-10-31
  Administered 2021-10-31: 15:00:00 1200

## 2021-10-31 MED ORDER — LIDOCAINE HCL 1 % IJ SOLN
1 % | Freq: Once | INTRAMUSCULAR | Status: DC | PRN
Start: 2021-10-31 — End: 2021-10-31

## 2021-10-31 MED ORDER — LACTATED RINGERS IV SOLN
INTRAVENOUS | Status: DC | PRN
Start: 2021-10-31 — End: 2021-10-31
  Administered 2021-10-31 (×2): via INTRAVENOUS

## 2021-10-31 MED ORDER — KETOROLAC TROMETHAMINE 30 MG/ML IJ SOLN
30 MG/ML | INTRAMUSCULAR | Status: DC | PRN
Start: 2021-10-31 — End: 2021-10-31
  Administered 2021-10-31: 15:00:00 15 via INTRAVENOUS

## 2021-10-31 MED ORDER — PROPOFOL 200 MG/20ML IV EMUL
200 MG/20ML | INTRAVENOUS | Status: DC | PRN
Start: 2021-10-31 — End: 2021-10-31
  Administered 2021-10-31: 14:00:00 200 via INTRAVENOUS
  Administered 2021-10-31: 14:00:00 50 via INTRAVENOUS

## 2021-10-31 MED ORDER — MIDAZOLAM HCL 2 MG/2ML IJ SOLN
2 MG/ML | Freq: Once | INTRAMUSCULAR | Status: DC
Start: 2021-10-31 — End: 2021-10-31

## 2021-10-31 MED FILL — ONDANSETRON HCL 4 MG/2ML IJ SOLN: 4 MG/2ML | INTRAMUSCULAR | Qty: 2

## 2021-10-31 NOTE — Anesthesia Post-Procedure Evaluation (Signed)
Department of Anesthesiology  Postprocedure Note    Patient: [name] [name]  MRN: [id number]  Birthdate: 13-Nov-1975  Date of evaluation: 10/31/2021      Procedure Summary     Date: 10/31/21 Room / Location: [facility] OR 07 / [facility]    Anesthesia Start: [phone] Anesthesia Stop: 0955    Procedure: DILATATION AND CURETTAGE, VIDEO  HYSTEROSCOPY WITH MYOSURE POLYPECTOMY AND NOVASURE ABLATION (Vagina ) Diagnosis:       Menorrhagia with regular cycle      Anemia, unspecified type      Endometrial polyp      (MENORRHAGIA; ANEMIA; POSSIBLE ENDOMETRIAL POLYP)    Surgeons: Darlina Rumpf, MD Responsible Provider: Ronald Lobo, MD    Anesthesia Type: general ASA Status: 2          Anesthesia Type: No value filed.    Aldrete Phase I: Aldrete Score: 10    Aldrete Phase II: Aldrete Score: 10      Anesthesia Post Evaluation    Patient location during evaluation: bedside  Patient participation: complete - patient cannot participate  Level of consciousness: lethargic  Airway patency: patent  Complications: no  Cardiovascular status: blood pressure returned to baseline  Respiratory status: acceptable  Hydration status: euvolemic

## 2021-10-31 NOTE — Brief Op Note (Signed)
Brief Postoperative Note      Patient: [name] [name]  Date of Birth: Apr 14, 1976  MRN: [id number]    Date of Procedure: 10/31/2021    Pre-Op Diagnosis: MENORRHAGIA; ANEMIA; POSSIBLE ENDOMETRIAL POLYP    Post-Op Diagnosis: Same and with ENDOMETRIAL POLYP       Procedure(s):  DILATATION AND CURETTAGE, VIDEO  HYSTEROSCOPY WITH MYOSURE POLYPECTOMY AND NOVASURE ABLATION    Surgeon(s):  Darlina Rumpf, MD    Assistant:  Surgical Assistant: Lenise Herald    Anesthesia: General, LMA, Lonni Fix, CRNA and Dr. Jeannette Corpus    Estimated Blood Loss (mL): less than 50     Complications: None    Specimens:   ID Type Source Tests Collected by Time Destination   A : ENDOMETRIAL CURETTINGS AND POLYP Tissue Endometrium SURGICAL PATHOLOGY Darlina Rumpf, MD 10/31/2021 [phone]        Implants:  * No implants in log *      Drains: * No LDAs found *    Findings: Anterverted uterus with no adnexal masses; uterus sounded to 12 cm, cervix to 5.5cm; endometrial cavity length 6.5,cm width 4.8cm; ostia visualized x 2 . Thickened endometrium with a single polyp present. Ablation took place for 50sec; Fluid deficit 700cc    Dict: #[id number]    Electronically signed by Darlina Rumpf, MD on 10/31/2021 at 9:58 AM

## 2021-10-31 NOTE — Progress Notes (Signed)
Pt brought to PACU. Report obtained from OR RN and anesthesia. Pt placed on monitor SR on monitor and O2 at RA, no vaginal bleeding noted.

## 2021-10-31 NOTE — Progress Notes (Signed)
Pt up to the bathroom to void without difficulty. Discharge instructions given to pt and family. Verbalized understanding. PIV removed. Pt dressed and wheeled out and discharged to the care of their family in stable condition.

## 2021-10-31 NOTE — Anesthesia Pre-Procedure Evaluation (Signed)
Department of Anesthesiology  Preprocedure Note       Name:  [name] [name]   Age:  46 y.o.  DOB:  1976-06-03                                          MRN:  [id number]         Date:  10/31/2021      Surgeon: Moishe Spice):  Vinnie Langton Inda Merlin, MD    Procedure: Procedure(s):  DILATATION AND CURETTAGE, VIDEO  HYSTEROSCOPYAND NOVASURE ABLATION    Medications prior to admission:   Prior to Admission medications    Medication Sig Start Date End Date Taking? Authorizing Provider   cetirizine (ZYRTEC) 10 MG tablet Take 10 mg by mouth daily as needed for Allergies   Yes Historical Provider, MD   cefdinir (OMNICEF) 300 MG capsule Take 300 mg by mouth 2 times daily   Yes Historical Provider, MD   diclofenac (VOLTAREN) 75 MG EC tablet Take 1 tablet by mouth 2 times daily 08/27/16   Maxwell Caul, MD       Current medications:    Current Facility-Administered Medications   Medication Dose Route Frequency Provider Last Rate Last Admin   ??? lidocaine PF 1 % injection 1 mL  1 mL IntraDERmal Once PRN Arnetha Gula, MD       ??? lactated ringers IV soln infusion   IntraVENous Continuous Arnetha Gula, MD       ??? sodium chloride flush 0.9 % injection 5-40 mL  5-40 mL IntraVENous 2 times per day Arnetha Gula, MD       ??? sodium chloride flush 0.9 % injection 5-40 mL  5-40 mL IntraVENous PRN Arnetha Gula, MD       ??? 0.9 % sodium chloride infusion   IntraVENous PRN Arnetha Gula, MD       ??? lidocaine 1 % injection 2 mL  2 mL IntraDERmal Once PRN Garner Gavel, MD       ??? lactated ringers IV soln infusion   IntraVENous Continuous Garner Gavel, MD           Allergies:    Allergies   Allergen Reactions   ??? Phenergan [Promethazine Hcl]      Had it IV and burnt going through vein, caused vomiting        Problem List:    Patient Active Problem List   Diagnosis Code   ??? Polycystic ovarian syndrome E28.2   ??? Hirsutism L68.0   ??? Thyroid activity decreased E03.9   ??? Hyperlipidemia  E78.5   ??? Allergic rhinitis J30.9       Past Medical History:        Diagnosis Date   ??? Hyperlipidemia 04/16/2014   ??? Hypothyroid 11/06/2012       Past Surgical History:  History reviewed. No pertinent surgical history.    Social History:    Social History     Tobacco Use   ??? Smoking status: Never   ??? Smokeless tobacco: Never   Substance Use Topics   ??? Alcohol use: Yes     Alcohol/week: 2.0 standard drinks     Types: 2 Glasses of wine per week     Comment: socially  Counseling given: Not Answered      Vital Signs (Current):   Vitals:    10/27/21 0845 10/31/21 0704   BP:  118/67   Pulse:  78   Resp:  16   Temp:  98.2 ??F (36.8 ??C)   TempSrc:  Temporal   SpO2:  96%   Weight: 253 lb (114.8 kg) 259 lb 8 oz (117.7 kg)   Height: 5\' 9"  (1.753 m) 5\' 9"  (1.753 m)                                              BP Readings from Last 3 Encounters:   10/31/21 118/67   11/03/17 127/69   08/27/16 (!) 110/58       NPO Status: Time of last liquid consumption: 2345                        Time of last solid consumption: 2000                        Date of last liquid consumption: 10/30/21                        Date of last solid food consumption: 10/30/21    BMI:   Wt Readings from Last 3 Encounters:   10/31/21 259 lb 8 oz (117.7 kg)   11/03/17 190 lb (86.2 kg)   08/27/16 264 lb (119.7 kg)     Body mass index is 38.32 kg/m??.    CBC:   Lab Results   Component Value Date/Time    WBC 10.5 10/31/2021 07:19 AM    RBC 4.57 10/31/2021 07:19 AM    HGB 12.6 10/31/2021 07:19 AM    HCT 38.9 10/31/2021 07:19 AM    MCV 85.2 10/31/2021 07:19 AM    RDW 20.1 10/31/2021 07:19 AM    PLT 311 10/31/2021 07:19 AM       CMP:   Lab Results   Component Value Date/Time    NA 140 08/05/2013 10:49 AM    K 4.1 08/05/2013 10:49 AM    CL 100 08/05/2013 10:49 AM    CO2 23 08/05/2013 10:49 AM    BUN 12 08/05/2013 10:49 AM    CREATININE 0.6 08/05/2013 10:49 AM    GFRAA >60 08/05/2013 10:49 AM    AGRATIO 1.5 08/05/2013 10:49 AM    LABGLOM >60  08/05/2013 10:49 AM    GLUCOSE 84 08/05/2013 10:49 AM    PROT 7.6 08/05/2013 10:49 AM    CALCIUM 9.6 08/05/2013 10:49 AM    BILITOT 0.3 08/05/2013 10:49 AM    ALKPHOS 70 08/05/2013 10:49 AM    AST 14 08/05/2013 10:49 AM    ALT 15 08/05/2013 10:49 AM       POC Tests: No results for input(s): POCGLU, POCNA, POCK, POCCL, POCBUN, POCHEMO, POCHCT in the last 72 hours.    Coags: No results found for: PROTIME, INR, APTT    HCG (If Applicable):   Lab Results   Component Value Date    PREGTESTUR Negative 10/31/2021        ABGs: No results found for: PHART, PO2ART, PCO2ART, HCO3ART, BEART, O2SATART     Type & Screen (If Applicable):  No results found for: LABABO, LABRH    Drug/Infectious Status (If  Applicable):  No results found for: HIV, HEPCAB    COVID-19 Screening (If Applicable): No results found for: COVID19        Anesthesia Evaluation    Airway: Mallampati: I  TM distance: >3 FB   Neck ROM: full  Mouth opening: > = 3 FB   Dental: normal exam         Pulmonary: breath sounds clear to auscultation  (+) decreased breath sounds                            Cardiovascular:    (+) hyperlipidemia        Rhythm: regular                      Neuro/Psych:               GI/Hepatic/Renal:   (+) morbid obesity          Endo/Other:                     Abdominal:   (+) obese,           Vascular:          Other Findings:           Anesthesia Plan      general     ASA 2       Induction: intravenous.    MIPS: Postoperative opioids intended.  Anesthetic plan and risks discussed with patient.                        Ronald Lobo, MD   10/31/2021

## 2021-10-31 NOTE — Progress Notes (Signed)
C/o nausea Zofran given at this time, placed on 3 L NC after patient decreased to 87%.

## 2021-10-31 NOTE — Op Note (Signed)
[facility]                 [address] [place], [place] [address]                                OPERATIVE REPORT    PATIENT NAME: [name] [name]                       DOB:        Oct 27, 1975  MED REC NO:   [id number]                          ROOM:  ACCOUNT NO:   [id number]                           ADMIT DATE: 10/31/2021  PROVIDER:     Olevia Bowens, MD    DATE OF PROCEDURE:  10/31/2021    PREOPERATIVE DIAGNOSES:  1.  Menorrhagia.  2.  Anemia.  3.  Possible endometrial polyp.    POSTOPERATIVE DIAGNOSES:  1.  Menorrhagia.  2.  Anemia.  3.  Possible endometrial polyp with confirmation of endometrial polyp.    PROCEDURES:  1.  Dilation and curettage.  2.  Video hysteroscopy with MyoSure for polypectomy.  3.  NovaSure ablation.    SURGEON:  Olevia Bowens, MD    ASSISTANT:  _____    ANESTHESIA:  General laryngeal mask airway anesthesia by Lonni Fix,  CRNA and Dr. Jeannette Corpus.    ESTIMATED BLOOD LOSS:  Less than 50 mL.    COMPLICATIONS:  None.    SPECIMENS:  Endometrial curettings and polyp.    DRAINS:  None.    FINDINGS:  Anteverted uterus with no adnexal masses.  Uterus sounded to  12 cm.  The cervix was measured to be 5.5 cm, subtracting the latter  from the former left an endometrial cavity length of 6.5 cm, and a width  was determined to be 4.8 cm.  Ostia were visualized x2 and documented.   Thickened endometrium with the single polyp present was visualized and  documented.  The ablation took place for 50 seconds.  Fluid deficit was  noted to be 700 mL, but there was a portion on the floor due to a hole  in the bag.    DESCRIPTION OF OPERATION:  The patient is a 46 year old white female  with a history of very heavy periods.  She presented with severe anemia  with a hemoglobin of about 8.5.  She underwent iron infusions and her H  and H corrected and an endometrial biopsy was benign with the  possibility of an endometrial polyp and an ultrasound showed a  very  thickened endometrium measuring 22.5 mm and containing a polyp measuring  1.6 cm.  Hysteroscopy D and C with MyoSure was recommended to the  patient.  NovaSure endometrial ablation was also discussed and the  patient agreed to proceed with both.  In the morning of surgery, the  patient had no further questions.  Her consent was reviewed.  Her labs  were reviewed and she agreed to proceed.    The patient was taken to the OR.  She was placed in the supine position.  General anesthesia was administered.  Once under anesthesia, the  patient's legs were placed in candy cane stirrups  and an exam under  anesthesia was performed with the above findings.  Her perineum and the  vagina were prepped and draped in the sterile fashion and a time-out was  accomplished.  A weighted speculum was placed in the posterior vaginal  wall and the anterior lip of the cervix was grasped with a single-tooth  tenaculum.  The weighted speculum was eventually replaced with a Sims  retractor due to the anteversion of her uterus.  The uterus was sounded  to 12 cm.  A 7/8 Hegar was used to determine the endocervical length,  which was determined to be 5.5 cm, subtracting that from the total  length of 12 yielded to the endometrial cavity length of 6.5 cm.   Progressive smooth Pratt dilators were used to dilate to the #23 and the  hysteroscope was advanced with direct visualization into the  endometrium, which was noted to be very thickened with one endometrial  polyp centrally located.  Ostia were visualized and documented x2.  The  MyoSure LITE device was then utilized for polypectomy and sampling of  the thickened endometrium throughout.  Once this was accomplished, the  NovaSure endometrial ablation device was placed within the cavity and  several maneuvers were employed to ensure its full deployment.  The  cavity width of 4.8 cm was determined and cavity assessment was passed  with one attempt.  Endometrial ablation then took place for  50 seconds  and the NovaSure ablation device was then removed.  The hysteroscope was  replaced and an ablated endometrium throughout was visualized and  documented.  This concluded the procedure.  There was no significant  bleeding from the site of the single-tooth tenaculum site once that was  removed, nor from the cervical os.  The patient was awoken from  anesthesia and brought to the recovery room in good condition.        Olevia Bowens, MD    D: 10/31/2021 10:08:28       T: 10/31/2021 12:17:19     [id number]  Job#: [id number]     Doc#: [id number]    CC:

## 2021-10-31 NOTE — H&P (Signed)
I have reviewed the history and physical and examined the patient and find no relevant changes.  I have reviewed with the patient and/or family the risks, benefits, and alternatives to the procedure.LMP 10/02/21. H/H 12.6/38.9, preg test neg.

## 2021-10-31 NOTE — Discharge Instructions (Addendum)
[phone]    DILATATION & CURETTAGE AND/OR HYSTEROSCOPY    DISCHARGE INSTRUCTIONS FOR DAY SURGERY PATIENTS AND FAMILY    1) Since you may experience some intermittent light-headedness for the next several hours, we suggest you plan on bed rest or quiet relaxation this evening. You must have a friend or relative stay with you tonight.    2)  Because of the sedation you have received, it is recommended that you do not drive a motor vehicle, operate any kind of machinery, or sign any contractual agreement for 24 hours following the procedure.    3)  You should not take alcoholic beverages tonight and only take sleeping medication that has been specifically prescribed for you by your physician.    4)  Eat lightly for your first meal and then gradually progress yourself back to a regular diet.    5)  If you experience pain in your surgical area, take Tylenol, or the pain medication prescribed by your doctor. Some pain medications are very irritating when taken on an empty stomach, so try to take the medication with a light meal or a glass of milk.    6)  If you have any fever, chills, bleeding, or uncontrollable pain or any other problems, notify your surgeon.    7)  Other instructions:   Pain:  Tylenol or Motrin every 4-6 hours as needed, call if no relief.   Activity:  Take it easy for 1-2 days   Exercise:  None for 1 week   Sex, douching, tampons:  None for 1 week   Shower:  In 24 hours   Tub bath, swimming:  None for 1 week   Appointment:  Call for an appointment in 4 weeks        ANESTHESIA DISCHARGE INSTRUCTIONS    You are under the influence of drugs- do not drink alcohol, drive a car, operate machinery(such as power tools, kitchen appliances, etc), sign legal documents, or make any important decisions for 24 hours (or while on pain medications).   Children should not ride bikes or [employer] or play on gym sets  for 24 hours after surgery.  A responsible adult should be with you for 24 hours.  Rest at  home today- increase activity as tolerated.  Progress slowly to a regular diet unless your physician has instructed you otherwise. Drink plenty of water.    CALL YOUR DOCTOR IF YOU:  Have moderate to severe nausea or vomiting AND are unable to hold down fluids or prescribed medications.  Have bright red bloody drainage from your dressing that won't stop oozing.  Do not get relief with your pain medication    NORMAL (POSSIBLE) SIDE EFFECTS FROM ANESTHESIA:     Confusion, temporary memory loss, delayed reaction times in the first 24 hours  Lightheadedness, dizziness, difficulty focusing, blurred vision  Nausea/vomiting can happen  Shivering, feeling cold, sore throat, cough and muscle aches should stop within 24-48 hours  Trouble urinating - call your surgeon if it has been more than 8 hrs  Bruising or soreness at the IV site - call if it remains red, firm or there is drainage             FEMALES OF CHILDBEARING AGE WHO ARE TAKING BIRTH CONTROL PILLS:  You may have received a medication during your procedure that interferes with the   actions of birth control pills (Bridion or Emend). Use some other kind of birth control in addition to your pills,  like a condom, for 1 month after your procedure to prevent unwanted pregnancy.    The following instructions are to be followed if you have a known history or diagnosis of sleep apnea:  For all sleep apnea patients:  ? Sleep on your side or sitting up in a chair whenever possible, especially the first 24 hours after surgery.  ? Use only medicines prescribed by your doctor.    ? Do not drink alcohol.  ? If you have a dental device to assist you while at rest, use it at all times for the first 24 hours.  For patients using CPAP machines:  ? Use your CPAP machine during all periods of sleep as usual.  ? Use your CPAP machine during all periods of daytime rest while on pain medicines.  ** Follow up with your primary care doctor for continued care.    IF YOU DO NOT TAKE ALL OF  YOUR NARCOTIC PAIN MEDICATION, please dispose of them responsibly. There are drop off boxes in the Emergency Departments 24/7 at both [facility] and [facility]. If these locations are not convenient, other options for discarding them can be found at:  [email or web address]    Hospital or office staff may NOT accept any medications to drop off in the cabinet for you.

## 2021-10-31 NOTE — Progress Notes (Signed)
Patient tolerating ice chips.
